# Patient Record
Sex: Male | Born: 1940 | Race: White | Hispanic: No | Marital: Single | Smoking: Former smoker
Health system: Southern US, Community
[De-identification: ages and names within clinical notes are randomized; demographics above are authoritative.]

## PROBLEM LIST (undated history)

## (undated) DIAGNOSIS — Z7901 Long term (current) use of anticoagulants: Secondary | ICD-10-CM

## (undated) DIAGNOSIS — E785 Hyperlipidemia, unspecified: Secondary | ICD-10-CM

## (undated) DIAGNOSIS — K08109 Complete loss of teeth, unspecified cause, unspecified class: Secondary | ICD-10-CM

## (undated) DIAGNOSIS — I252 Old myocardial infarction: Secondary | ICD-10-CM

## (undated) DIAGNOSIS — R609 Edema, unspecified: Secondary | ICD-10-CM

## (undated) DIAGNOSIS — I2510 Atherosclerotic heart disease of native coronary artery without angina pectoris: Secondary | ICD-10-CM

## (undated) DIAGNOSIS — Z973 Presence of spectacles and contact lenses: Secondary | ICD-10-CM

## (undated) DIAGNOSIS — I480 Paroxysmal atrial fibrillation: Secondary | ICD-10-CM

## (undated) DIAGNOSIS — M1990 Unspecified osteoarthritis, unspecified site: Secondary | ICD-10-CM

## (undated) DIAGNOSIS — Z972 Presence of dental prosthetic device (complete) (partial): Secondary | ICD-10-CM

## (undated) DIAGNOSIS — I10 Essential (primary) hypertension: Secondary | ICD-10-CM

## (undated) DIAGNOSIS — E119 Type 2 diabetes mellitus without complications: Secondary | ICD-10-CM

## (undated) HISTORY — DX: Hyperlipidemia, unspecified: E78.5

## (undated) HISTORY — PX: COLONOSCOPY: SHX174

## (undated) HISTORY — DX: Long term (current) use of anticoagulants: Z79.01

## (undated) HISTORY — DX: Old myocardial infarction: I25.2

## (undated) HISTORY — PX: TONSILLECTOMY: SUR1361

## (undated) HISTORY — DX: Paroxysmal atrial fibrillation: I48.0

## (undated) HISTORY — DX: Unspecified osteoarthritis, unspecified site: M19.90

## (undated) HISTORY — DX: Atherosclerotic heart disease of native coronary artery without angina pectoris: I25.10

## (undated) HISTORY — DX: Essential (primary) hypertension: I10

## (undated) HISTORY — DX: Edema, unspecified: R60.9

## (undated) HISTORY — PX: SKIN BIOPSY: SHX1

## (undated) HISTORY — PX: CARDIAC SURGERY: SHX584

## (undated) HISTORY — DX: Type 2 diabetes mellitus without complications: E11.9

## (undated) HISTORY — PX: TONSILLECTOMY: SHX5618

---

## 2002-09-05 HISTORY — PX: CARDIAC SURGERY: SHX584

## 2003-09-06 HISTORY — PX: CORONARY ARTERY BYPASS GRAFT: SHX141

## 2003-11-10 ENCOUNTER — Inpatient Hospital Stay (HOSPITAL_COMMUNITY): Admission: AD | Admit: 2003-11-10 | Discharge: 2003-11-17 | Payer: Self-pay | Admitting: Emergency Medicine

## 2004-01-05 ENCOUNTER — Encounter (HOSPITAL_COMMUNITY): Admission: RE | Admit: 2004-01-05 | Discharge: 2004-04-04 | Payer: Self-pay | Admitting: Cardiology

## 2004-04-05 ENCOUNTER — Encounter (HOSPITAL_COMMUNITY): Admission: RE | Admit: 2004-04-05 | Discharge: 2004-07-04 | Payer: Self-pay | Admitting: Cardiology

## 2004-07-15 ENCOUNTER — Observation Stay (HOSPITAL_COMMUNITY): Admission: EM | Admit: 2004-07-15 | Discharge: 2004-07-16 | Payer: Self-pay | Admitting: Emergency Medicine

## 2005-06-28 ENCOUNTER — Ambulatory Visit (HOSPITAL_COMMUNITY): Admission: RE | Admit: 2005-06-28 | Discharge: 2005-06-28 | Payer: Self-pay | Admitting: Gastroenterology

## 2005-06-28 ENCOUNTER — Encounter (INDEPENDENT_AMBULATORY_CARE_PROVIDER_SITE_OTHER): Payer: Self-pay | Admitting: *Deleted

## 2005-11-03 DIAGNOSIS — I252 Old myocardial infarction: Secondary | ICD-10-CM

## 2005-11-03 HISTORY — DX: Old myocardial infarction: I25.2

## 2009-05-16 ENCOUNTER — Ambulatory Visit: Payer: Self-pay | Admitting: Internal Medicine

## 2009-05-16 ENCOUNTER — Inpatient Hospital Stay (HOSPITAL_COMMUNITY): Admission: EM | Admit: 2009-05-16 | Discharge: 2009-05-19 | Payer: Self-pay | Admitting: Emergency Medicine

## 2009-05-18 HISTORY — PX: CARDIAC CATHETERIZATION: SHX172

## 2010-04-13 ENCOUNTER — Ambulatory Visit: Payer: Self-pay | Admitting: Cardiology

## 2010-05-11 ENCOUNTER — Ambulatory Visit: Payer: Self-pay | Admitting: Cardiology

## 2010-06-08 ENCOUNTER — Ambulatory Visit: Payer: Self-pay | Admitting: Cardiology

## 2010-07-06 ENCOUNTER — Ambulatory Visit: Payer: Self-pay | Admitting: Cardiology

## 2010-07-19 ENCOUNTER — Ambulatory Visit: Payer: Self-pay | Admitting: Cardiology

## 2010-08-02 ENCOUNTER — Ambulatory Visit: Payer: Self-pay | Admitting: Cardiology

## 2010-09-10 ENCOUNTER — Ambulatory Visit: Payer: Self-pay | Admitting: Cardiology

## 2010-09-26 ENCOUNTER — Encounter: Payer: Self-pay | Admitting: Thoracic Surgery (Cardiothoracic Vascular Surgery)

## 2010-10-11 ENCOUNTER — Encounter (INDEPENDENT_AMBULATORY_CARE_PROVIDER_SITE_OTHER): Payer: Medicare Other

## 2010-10-11 DIAGNOSIS — I4891 Unspecified atrial fibrillation: Secondary | ICD-10-CM

## 2010-10-11 DIAGNOSIS — Z7901 Long term (current) use of anticoagulants: Secondary | ICD-10-CM

## 2010-10-25 ENCOUNTER — Other Ambulatory Visit (INDEPENDENT_AMBULATORY_CARE_PROVIDER_SITE_OTHER): Payer: Medicare Other

## 2010-10-25 DIAGNOSIS — Z7901 Long term (current) use of anticoagulants: Secondary | ICD-10-CM

## 2010-10-25 DIAGNOSIS — I4891 Unspecified atrial fibrillation: Secondary | ICD-10-CM

## 2010-11-22 ENCOUNTER — Encounter (INDEPENDENT_AMBULATORY_CARE_PROVIDER_SITE_OTHER): Payer: Medicare Other

## 2010-11-22 DIAGNOSIS — I4891 Unspecified atrial fibrillation: Secondary | ICD-10-CM

## 2010-11-22 DIAGNOSIS — Z7901 Long term (current) use of anticoagulants: Secondary | ICD-10-CM

## 2010-12-06 ENCOUNTER — Ambulatory Visit (INDEPENDENT_AMBULATORY_CARE_PROVIDER_SITE_OTHER): Payer: Medicare Other | Admitting: *Deleted

## 2010-12-06 DIAGNOSIS — Z7901 Long term (current) use of anticoagulants: Secondary | ICD-10-CM

## 2010-12-06 DIAGNOSIS — I4891 Unspecified atrial fibrillation: Secondary | ICD-10-CM | POA: Insufficient documentation

## 2010-12-06 LAB — POCT INR: INR: 1.9

## 2010-12-10 LAB — GLUCOSE, CAPILLARY
Glucose-Capillary: 102 mg/dL — ABNORMAL HIGH (ref 70–99)
Glucose-Capillary: 109 mg/dL — ABNORMAL HIGH (ref 70–99)
Glucose-Capillary: 109 mg/dL — ABNORMAL HIGH (ref 70–99)
Glucose-Capillary: 115 mg/dL — ABNORMAL HIGH (ref 70–99)
Glucose-Capillary: 124 mg/dL — ABNORMAL HIGH (ref 70–99)
Glucose-Capillary: 126 mg/dL — ABNORMAL HIGH (ref 70–99)
Glucose-Capillary: 129 mg/dL — ABNORMAL HIGH (ref 70–99)
Glucose-Capillary: 131 mg/dL — ABNORMAL HIGH (ref 70–99)
Glucose-Capillary: 61 mg/dL — ABNORMAL LOW (ref 70–99)

## 2010-12-10 LAB — BASIC METABOLIC PANEL
BUN: 14 mg/dL (ref 6–23)
Calcium: 9.2 mg/dL (ref 8.4–10.5)
GFR calc non Af Amer: >60 mL/min (ref >60)
Glucose, Bld: 108 mg/dL — ABNORMAL HIGH (ref 70–99)
Potassium: 4 mEq/L (ref 3.5–5.1)

## 2010-12-10 LAB — PROTIME-INR
INR: 1.6 — ABNORMAL HIGH (ref 0.00–1.49)
INR: 1.9 — ABNORMAL HIGH (ref 0.00–1.49)
INR: 2.4 — ABNORMAL HIGH (ref 0.00–1.49)
Prothrombin Time: 19.1 seconds — ABNORMAL HIGH (ref 11.6–15.2)
Prothrombin Time: 21.6 seconds — ABNORMAL HIGH (ref 11.6–15.2)
Prothrombin Time: 25.8 seconds — ABNORMAL HIGH (ref 11.6–15.2)

## 2010-12-10 LAB — POCT I-STAT, CHEM 8
BUN: 19 mg/dL (ref 6–23)
Calcium, Ion: 1.16 mmol/L (ref 1.12–1.32)
Chloride: 104 mEq/L (ref 96–112)
Creatinine, Ser: 1.1 mg/dL (ref 0.4–1.5)
Glucose, Bld: 122 mg/dL — ABNORMAL HIGH (ref 70–99)
HCT: 46 % (ref 39.0–52.0)
Hemoglobin: 15.6 g/dL (ref 13.0–17.0)
Potassium: 3.8 mEq/L (ref 3.5–5.1)
Sodium: 139 mEq/L (ref 135–145)
TCO2: 25 mmol/L (ref 0–100)

## 2010-12-10 LAB — POCT CARDIAC MARKERS
CKMB, poc: 2.2 ng/mL (ref 1.0–8.0)
CKMB, poc: 2.3 ng/mL (ref 1.0–8.0)
Myoglobin, poc: 140 ng/mL (ref 12–200)
Myoglobin, poc: 142 ng/mL (ref 12–200)
Troponin i, poc: <0.05 ng/mL (ref 0.00–0.09)
Troponin i, poc: <0.05 ng/mL (ref 0.00–0.09)

## 2010-12-10 LAB — LIPID PANEL
Cholesterol: 127 mg/dL (ref 0–200)
HDL: 40 mg/dL (ref >39)
LDL Cholesterol: 69 mg/dL (ref 0–99)
Total CHOL/HDL Ratio: 3.2 RATIO
Triglycerides: 90 mg/dL (ref <150)
VLDL: 18 mg/dL (ref 0–40)

## 2010-12-10 LAB — APTT
aPTT: 37 seconds (ref 24–37)
aPTT: 43 seconds — ABNORMAL HIGH (ref 24–37)

## 2010-12-10 LAB — CBC
HCT: 41.7 % (ref 39.0–52.0)
Hemoglobin: 14.1 g/dL (ref 13.0–17.0)
MCHC: 33.8 g/dL (ref 30.0–36.0)
MCV: 94.2 fL (ref 78.0–100.0)
Platelets: 170 10*3/uL (ref 150–400)
Platelets: 190 10*3/uL (ref 150–400)
RBC: 4.42 MIL/uL (ref 4.22–5.81)
RDW: 13.6 % (ref 11.5–15.5)
RDW: 13.7 % (ref 11.5–15.5)
WBC: 10.8 10*3/uL — ABNORMAL HIGH (ref 4.0–10.5)
WBC: 8.7 10*3/uL (ref 4.0–10.5)

## 2010-12-10 LAB — HEMOGLOBIN A1C
Hgb A1c MFr Bld: 6.3 % — ABNORMAL HIGH (ref 4.6–6.1)
Mean Plasma Glucose: 134 mg/dL

## 2010-12-10 LAB — CARDIAC PANEL(CRET KIN+CKTOT+MB+TROPI)
CK, MB: 2.2 ng/mL (ref 0.3–4.0)
CK, MB: 2.3 ng/mL (ref 0.3–4.0)
Relative Index: 1.2 (ref 0.0–2.5)
Relative Index: 1.4 (ref 0.0–2.5)
Total CK: 168 U/L (ref 7–232)
Total CK: 179 U/L (ref 7–232)
Troponin I: 0.01 ng/mL (ref 0.00–0.06)
Troponin I: 0.02 ng/mL (ref 0.00–0.06)

## 2010-12-10 LAB — CK TOTAL AND CKMB (NOT AT ARMC)
CK, MB: 2.9 ng/mL (ref 0.3–4.0)
Relative Index: 1.6 (ref 0.0–2.5)
Total CK: 181 U/L (ref 7–232)

## 2010-12-10 LAB — TROPONIN I: Troponin I: 0.02 ng/mL (ref 0.00–0.06)

## 2010-12-20 ENCOUNTER — Encounter: Payer: Self-pay | Admitting: *Deleted

## 2010-12-20 DIAGNOSIS — I10 Essential (primary) hypertension: Secondary | ICD-10-CM

## 2010-12-20 DIAGNOSIS — E785 Hyperlipidemia, unspecified: Secondary | ICD-10-CM | POA: Insufficient documentation

## 2010-12-20 DIAGNOSIS — Z7901 Long term (current) use of anticoagulants: Secondary | ICD-10-CM | POA: Insufficient documentation

## 2010-12-20 DIAGNOSIS — E119 Type 2 diabetes mellitus without complications: Secondary | ICD-10-CM | POA: Insufficient documentation

## 2010-12-20 DIAGNOSIS — E1159 Type 2 diabetes mellitus with other circulatory complications: Secondary | ICD-10-CM | POA: Insufficient documentation

## 2010-12-21 ENCOUNTER — Ambulatory Visit (INDEPENDENT_AMBULATORY_CARE_PROVIDER_SITE_OTHER): Payer: Medicare Other | Admitting: *Deleted

## 2010-12-21 ENCOUNTER — Other Ambulatory Visit: Payer: Self-pay | Admitting: *Deleted

## 2010-12-21 DIAGNOSIS — Z7901 Long term (current) use of anticoagulants: Secondary | ICD-10-CM

## 2010-12-21 DIAGNOSIS — I4891 Unspecified atrial fibrillation: Secondary | ICD-10-CM

## 2010-12-23 ENCOUNTER — Other Ambulatory Visit: Payer: Self-pay | Admitting: *Deleted

## 2010-12-23 DIAGNOSIS — I2510 Atherosclerotic heart disease of native coronary artery without angina pectoris: Secondary | ICD-10-CM

## 2010-12-23 DIAGNOSIS — I10 Essential (primary) hypertension: Secondary | ICD-10-CM

## 2010-12-23 MED ORDER — LISINOPRIL 40 MG PO TABS: 40.0000 mg | ORAL_TABLET | Freq: Every day | ORAL | Status: DC

## 2010-12-23 MED ORDER — ALLOPURINOL 300 MG PO TABS: 300.0000 mg | ORAL_TABLET | Freq: Every day | ORAL | Status: DC

## 2010-12-23 MED ORDER — METFORMIN HCL ER 500 MG PO TB24: 500.0000 mg | ORAL_TABLET | Freq: Two times a day (BID) | ORAL | Status: DC

## 2010-12-23 NOTE — Telephone Encounter (Signed)
Refill meds per pt request

## 2011-01-03 ENCOUNTER — Ambulatory Visit (INDEPENDENT_AMBULATORY_CARE_PROVIDER_SITE_OTHER): Payer: Medicare Other | Admitting: *Deleted

## 2011-01-03 DIAGNOSIS — Z7901 Long term (current) use of anticoagulants: Secondary | ICD-10-CM

## 2011-01-03 DIAGNOSIS — I4891 Unspecified atrial fibrillation: Secondary | ICD-10-CM

## 2011-01-03 LAB — POCT INR: INR: 2

## 2011-01-21 NOTE — H&P (Signed)
[name], Spencer                        ACCOUNT NO.:  [id number]   MEDICAL RECORD NO.:  [id number]                   PATIENT TYPE:  EMS   LOCATION:  MAJO                                 FACILITY:  [facility]   PHYSICIAN:  Peter M. Swaziland, M.D.               DATE OF BIRTH:  [birth year]   DATE OF ADMISSION:  11/10/2003  DATE OF DISCHARGE:                                HISTORY & PHYSICAL   HISTORY OF PRESENT ILLNESS:  [name] is a 70 year old white man who  is admitted to [facility] for an acute anteroseptal myocardial  infarction.   The patient, who has no past history of cardiac disease, presented to the  emergency department approximately 45 minutes after experiencing onset of  chest pain.  This occurred while he was sleeping.  The chest pain was  described as a substernal pressure.  It radiated to his left neck.  It was  associated with mild dyspnea but no diaphoresis or nausea.  There were no  exacerbating or ameliorating factors.  It appeared not to be related to  position, activity, meals, or respirations.  With the measures employed by  EMS and those employed upon his arrival in the emergency department, his  chest discomfort has begun to wane.   As noted, the patient has no past history of cardiac disease, including no  history of chest pain, myocardial infarction, coronary artery disease,  congestive heart failure, or arrhythmia.  He has a number of risk factors  for coronary artery disease.  He has a history of hyperlipidemia,  dyslipidemia, and non-insulin-dependent diabetes mellitus.  In addition,  there is a strong family history of early coronary artery disease; both his  mother and father suffered from coronary artery disease in their 7s.  The  patient does not smoke.   MEDICATIONS:  The patient is on a number of medications; these include:  1. Allopurinol 300 mg p.o. daily.  2. Metformin 500 mg p.o. b.i.d.  3. Celebrex 200 mg p.o. b.i.d.  4.  Crestor 10 mg p.o. daily.  5. Lisinopril 40 mg p.o. daily.  6. Another unknown antihypertensive.   ALLERGIES:  He is reportedly allergic to [facility].   SOCIAL HISTORY:  The patient is a retired Naval architect.  He lives with his  wife.   FAMILY HISTORY:  Family history is as described above.   REVIEW OF SYSTEMS:  Review of systems demonstrated no new problems related  to his head, eyes, ears, nose, mouth, throat, lungs, gastrointestinal  system, genitourinary system, or extremities.  There is no history of  neurologic or psychiatric disorder.  There is no history of fever, chills,  or weight loss.   PHYSICAL EXAMINATION:  VITAL SIGNS:  Blood pressure 155/78.  Pulse 81 and  regular.  Respirations 18.  GENERAL:  The patient was an obese, middle-aged white male in no discomfort.  He was alert, oriented, appropriate, and responsive.  HEENT:  Head, eyes, nose, and mouth were normal.  NECK:  The neck was without thyromegaly or adenopathy.  Carotid pulses were  palpable bilaterally and without bruits.  CARDIAC:  Examination revealed a normal S1 and S2.  There was no S3, S4,  murmur, rub, or click.  Cardiac rhythm was regular.  No chest wall  tenderness was noted.  LUNGS:  The lungs were clear.  ABDOMEN:  The abdomen was soft and nontender.  There was no mass,  hepatosplenomegaly, bruit, distention, rebound, guarding, or rigidity.  Bowel sounds were normal.  RECTAL AND GENITAL:  Examinations were not performed as they were not  pertinent to the reason for acute care hospitalization.  EXTREMITIES:  The extremities were without edema, deviation, or deformity.  Radial and dorsalis pedal pulses were palpable bilaterally.  NEUROLOGIC:  Brief screening neurologic survey was unremarkable.   LABORATORY AND ACCESSORY CLINICAL DATA:  The electrocardiogram demonstrated  acute anteroseptal ST segment elevation.   The chest radiograph demonstrated interstitial edema.   All laboratory studies were  pending at the time of this dictation.   IMPRESSION:  1. Acute anteroseptal myocardial infarction.  2. Hypertension.  3. Dyslipidemia.  4. Diabetes mellitus.  5. Gout.   PLAN:  1. Emergent cardiac catheterization.  2. Intravenous heparin, intravenous nitroglycerin, intravenous metoprolol,     and oral aspirin until then.  3. Cardiac catheterization, PTCA, and stent deployment have been discussed     with the patient and his wife.  They understand the risks of the     procedures, the benefits of the procedures, and the alternatives to the     procedures, and they wish to proceed.      Quita Skye. Waldon Reining, MD                   Peter M. Swaziland, M.D.    MSC/MEDQ  D:  11/10/2003  T:  11/10/2003  Job:  [id number]   cc:   Peter M. Swaziland, M.D.  [address], [address]  [place], [place] [address]  Fax: [phone]

## 2011-01-21 NOTE — Consult Note (Signed)
NAMETAMARCUS, CONDIE                        ACCOUNT NO.:  [id number]   MEDICAL RECORD NO.:  [id number]                   PATIENT TYPE:  INP   LOCATION:  2930                                 FACILITY:  [facility]   PHYSICIAN:  Salvatore Decent. Dorris Fetch, M.D.         DATE OF BIRTH:  [birth year]   DATE OF CONSULTATION:  11/10/2003  DATE OF DISCHARGE:                                   CONSULTATION   REASON FOR CONSULTATION:  Severe two-vessel disease, status post MI.   CHIEF COMPLAINT:  Chest pain.   HISTORY OF PRESENT ILLNESS:  Mr. Blaustein is a 69 year old gentleman.  He  has no prior history of cardiac disease.  He noted the onset of severe  substernal chest pressure last night.  This awoke him from his sleep around  10:30 or 11 o'clock.  It radiated to his left neck.  He had some mild  dyspnea, but no diaphoresis or nausea.  He got up to try to go to the  bathroom, but every time he took a step the pain became worse.  He had no  relief after going to the bathroom and then called 911.  He was seen by EMS.  He was treated with nitroglycerin and aspirin en route to the hospital and  was having resolution of his chest pain by the time he arrived.  In  retrospect, the patient says that he has had heartburn, not consistently  exertional in nature, but usually is relieved by Tums.  He has no prior  history of known angina.   PAST MEDICAL HISTORY:  1. Hyperlipidemia.  2. Hypertension.  3. Type 2 noninsulin-dependent diabetes.  4. History of heavy tobacco abuse.  5. He has no history of stroke, TIA or cardiac disease.   MEDICATIONS PRIOR TO ADMISSION:  1. Allopurinol 300 mg p.o. daily.  2. Metformin 500 mg p.o. b.i.d.  3. Celebrex 200 mg p.o. b.i.d.  4. Crestor 10 mg p.o. daily.  5. Lisinopril 40 mg p.o. b.i.d.  6. Ziac.   ALLERGIES:  KEFLEX, with rash.   FAMILY HISTORY:  Both mother and father had heart disease in their 35s with  MIs.   SOCIAL HISTORY:  He is a retired Naval architect.   He had a past history of  tobacco abuse.  He quit in 1989.  He lives with his wife.   REVIEW OF SYSTEMS:  He states that he has been tired and feeling more  fatigue for the past six months to one year.  He attributed this to  inactivity.  He has been relatively inactive.  He did not have any history  of stroke or mini stroke symptoms.  No history of blood clot or  varicosities.  No change in bowel or bladder habits.  No history of  excessive bleeding or bruising.  All other systems are negative.   PHYSICAL EXAMINATION:  GENERAL:  Mr. Every is a well-appearing, 63-year-  old, white male in  no acute distress.  He is overweight, but well-developed  and well-nourished.  VITAL SIGNS:  Blood pressure 115/55; pulse 65 and regular; respirations 14;  he is on two liters of nasal cannula with oxygen saturation of 94%.  NEUROLOGIC:  He is alert and oriented x3 and grossly intact.  HEENT:  Unremarkable.  NECK:  Supple.  There is no thyromegaly, adenopathy, bruits or JVD.  CARDIAC:  Regular rate and rhythm.  Normal S1, S2.  There is no rub or  murmur.  LUNGS:  Clear with equal breath sounds bilaterally.  There is no wheezing or  rales.  ABDOMEN:  Soft and nontender.  EXTREMITIES:  Without clubbing, cyanosis or edema.  He has 2+ dorsalis pedis  and radial pulses bilaterally.   LABORATORY DATA:  Chest radiograph shows questionable 1 cm right lung  nodule, which may be a nipple shadow.  This will be further evaluated  postoperatively.  He also had some interstitial edema and his heart size was  normal.  His EKG showed anteroseptal ST elevations on admission.  His  hematocrit was 46, white count 11.1, platelets 219.  His creatinine was 1.2,  PT 32, PTT is 12.3.  Peak CK was 377 with an MB of 22.7.  Troponin was 1.48.   IMPRESSION:  Mr. Santilli is a 70 year old white male with multiple cardiac  risk factors.  He now presents with an acute myocardial infarction which  aborted en route to the  hospital.  He was evaluated with emergent  catheterization, which revealed critical two-vessel disease in the left  dominant circulation.  He had critical disease in his LAD, which was not  favorable for percutaneous intervention, also significant disease in the  diagonal as well as two obtuse marginal branches.  As the patient's symptoms  resolved and his EKG changes resolved after arrival, he has been treated  medically with intravenous nitroglycerin and Integrilin.  Coronary artery  bypass grafting is indicated for survival benefit and relief of symptoms.  As he is presently stable, we will tentatively plan for OR tomorrow, second  case.  Should he have recurrent or unstable symptoms arise, he could be done  emergently.   I discussed with the patient and the family the indications, the risks,  benefits and alternative treatments.  They understand that the risks  include, but are not limited to, death, stroke, MI, DVT, PE, bleeding,  possible need for transfusions, infections, as well as other organ and  system dysfunction including respiratory, renal or GI dysfunction.  He  understands and accepts the risks of surgery and agrees to proceed, but he  and his family  understand the expected postoperative recovery.  They also understand the  procedure is palliative and further lifestyle modification is important for  his long term outcome.  All of the patient's and family member's questions  were answered.                                               Salvatore Decent Dorris Fetch, M.D.    SCH/MEDQ  D:  11/10/2003  T:  11/11/2003  Job:  [id number]   cc:   Peter M. Swaziland, M.D.  [address]  [place], [place] [address]  Fax: [phone]   Quita Skye. Waldon Reining, MD  [address]  [place], [place] [address]  Fax: [phone]

## 2011-01-21 NOTE — Discharge Summary (Signed)
[name], [name]                        ACCOUNT NO.:  [id number]   MEDICAL RECORD NO.:  [id number]                   PATIENT TYPE:  INP   LOCATION:  2027                                 FACILITY:  [facility]   PHYSICIAN:  Salvatore Decent. Dorris Fetch, M.D.         DATE OF BIRTH:  [birth year]   DATE OF ADMISSION:  11/08/2003  DATE OF DISCHARGE:  11/17/2003                                 DISCHARGE SUMMARY   ADMISSION DIAGNOSIS:  Acute anteroseptal myocardial infarction.   ADDITIONAL DIAGNOSES/DISCHARGE DIAGNOSES:  1. Postoperative atrial fibrillation/atrial flutter, resolved at the time of     discharge to normal sinus rhythm.  2. History of hyperlipidemia.  3. History of type 2 diabetes mellitus.  4. History of heavy tobacco abuse.   HOSPITAL MANAGEMENT/PROCEDURES:  1. Cardiac catheterization completed on November 10, 2003, by Dr. Peter Swaziland.  2. Bilateral carotid duplex study completed on November 10, 2003, which revealed     no internal carotid artery stenosis.  3. Bilateral ABIs completed on November 10, 2003, which were within normal     limits.  4. Coronary artery bypass grafting x4 completed on November 11, 2003, by Dr.     Dorris Fetch.   CONSULTATIONS:  1. Cardiac rehab.  2. Case management.  3. Nutrition.  4. Diabetes coordinator.   HPI:  [name] is a 70 year old white man who has no past history of  coronary artery disease and presented to the emergency department after  experiencing onset of chest pain.  His chest pain occurred while he was  sleeping and he described it as a substernal pressure and radiated to his  left neck.  It was associated with mild dyspnea but no diaphoresis or  nausea.  There were no exacerbating or ameliorating factors.  Patient was  seen in the emergency department and admitted for further evaluation and  management of an acute anteroseptal myocardial infarction.   HOSPITAL COURSE:  [name] was admitted then to [facility]  November 10, 2003,  for evaluation and management of an acute myocardial  infarction.  Patient was treated appropriately with medical management.  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  He had  well preserved left ventricular function with focal apical wall motion  abnormality.  The patient remained hemodynamically stable.  Given the  finding of multi-vessel disease, it was recommended that he be stabilized  medically and be considered for coronary artery bypass grafting surgery.  A  CVTS consult was initiated and Dr. Dorris Fetch responded appropriately.   Dr. Dorris Fetch saw the patient in consultation on November 10, 2003, and his  impression was that he indeed had severe coronary artery disease and would  benefit from coronary revascularization through coronary artery bypass  grafting.  The risks, benefits and alternatives were discussed with the  patient and his family at that time.  They understood these risks and agreed  to proceed with surgery.  The patient was worked up appropriately with  preoperative vascular studies including bilateral carotid duplexes, radial  artery assessment as well as ABIs.  Patient was found to have no significant  internal carotid artery stenosis.  A right Doppler radial was normal with a  decrease to 50% with over-compression.  Left Doppler was decreased by  greater than 50% with radial compressions and was normal with ulnar  compression.  He had bilateral ABIs that were within normal limits, that  being greater than 1.0.  The patient remained hemodynamically stable and  free of chest pain until the time of anticipated surgery which was planned  for November 11, 2003.   The patient was taken to the operating room on November 11, 2003, and underwent  coronary artery bypass grafting x4 utilizing the left internal mammary  artery to the left anterior descending, saphenous vein graft  to the first  diagonal, sequential saphenous vein graft to the obtuse marginals 1 and 2.  The greater saphenous vein was harvested endoscopically from the right  thigh.  Patient tolerated the procedure well and was transferred to the  surgical intensive care unit in critical but stable condition.  The patient  remained hemodynamically stable throughout the evening.  He had good urine  output and no evidence of bleeding from his chest tube.  Patient was weaned  to extubate from the ventilator without difficulty later that evening.   On postop day #1, the patient continued to do fairly well.  He was afebrile  and hemodynamically stable.  He was neurologically intact post extubation.  His heart was in a regular rate and rhythm and his chest was clear.  Patient's chest tube output was minimal and they were discontinued in  stepwise fashion.  His ___________ lines were also discontinued in routine  fashion.  Patient was initiated on beta-blocker therapy as well as enteric  coated aspirin and statin therapy.  Later that evening, the patient went  into a rapid atrial fibrillation/atrial flutter at the rate of 100-150s.  He  was asymptomatic.  A Cardizem bolus was given and he was started on a  Cardizem drip thereafter.  The patient's rate decreased with IV Cardizem  therapy to a rate controlled atrial fib/atrial flutter.  He remained  asymptomatic and  hemodynamically stable.  Patient was loaded with IV  amiodarone in addition to IV Cardizem.  The cardiology team attempted rapid  atrial pacing to convert the patient out of this arrhythmia and was  unsuccessful.  Otherwise, the patient was initiated on cardiac rehab phase 1  and was advanced as tolerated.  Over the next hospital day, his IV  amiodarone therapy was converted to p.o. medication.  The patient converted  back into a normal sinus rhythm at a rate of 70s-80s.  He was continued on p.o. amiodarone and beta-blocker.  Patient continued to  be diuresed  appropriately and his diabetes was managed with insulin therapy in addition  to oral Amaryl.  His ACE inhibitor and Metformin were held secondary to mild  renal insufficiency postoperatively.  The patient was deemed appropriate for  transfer to the Step-Down Unit on November 14, 2003, or postop day #3.  He  remained in a normal sinus rhythm at a rate of 70-80.  He was ambulating in  the hallways without difficulty.  He was tolerating a regular diet.  He had  normal bowel and bladder function.  His pain was well controlled with oral  medications.  He was sating at the low to mid 90s on room air.  His weight  was trending towards preoperative weight.  His BUN and creatinine were  monitored for slight renal insufficiency and trended towards normal prior to  discharge planning.  The patient was deemed appropriate for discharge  planning on postop day #5, on November 16, 2003.   At the time of discharge planning, the patient's physical exam was as  follows.  Remained afebrile for greater than 48 hours.  His blood pressure  was 140/66, pulse was 80 and regular, respirations were 20 and unlabored,  SPO2 was 95% on room air.  His heart was in a regular rate and rhythm,  reading normal sinus rhythm on telemetry.  His lungs revealed faint  bibasilar crackles, otherwise were clear.  His abdomen was benign.  Extremities revealed trace pedal edema.  His incisions were healing well  without evidence of infection.  His sternum was stable.   LABORATORY VALUES AT THE TIME OF DISCHARGE PLANNING WERE AS FOLLOWS:  On  November 15, 2003, a CBC reads WBC of 13.0, hemoglobin of 10.0, hematocrit of  29.3, platelet count of 202.  BMP from November 15, 2003, reads sodium of 135,  potassium of 3.8, chloride of 100, CO2 of 29, glucose of 112, BUN of 36,  creatinine of 1.6 and calcium of 8.3.  Bedside glucose levels for the last  24 hours were as follows:  97, 112, 116 and 145.   Two-view chest x-ray completed on  November 13, 2003, reveals no evidence of  pneumothorax with bibasilar atelectasis postoperatively.   We will continue as planned with discharge to home Monday, November 17, 2003,  pending a.m. rounds and no change in the patient's clinical status.   DISCHARGE MEDICATIONS:  1. Aspirin 325 mg daily.  2. Toprol XL 50 mg daily.  3. Lisinopril 40 mg daily.  4. Crestor 10 mg daily.  5. Allopurinol 300 mg daily.  6. Amiodarone 200 mg three times daily.  7. Lantus insulin 30 units subcu q.h.s.  8. Lasix 40 mg daily for seven days.  9. Metformin 500 mg twice daily.  10.      Ultram 50 mg one or two tabs every 4-6 hours p.r.n. for pain.   ALLERGIES:  KEFLEX.   DISCHARGE INSTRUCTIONS:  1. Activity:  No driving, no lifting of more than 10 pounds.  The patient     should continue to walk daily.  2. Diet:  The patient should follow a low-fat, low-salt, carbohydrate-     modified medium calorie diet. 3. Wound care:  The patient may shower.  He should clean his incisions daily     with soap and water.  He should notify the CVTS office if he has any     increased redness, draining or swelling from his incisions.   FOLLOWUP APPOINTMENTS:  1. Patient is to followup with Dr. Swaziland within two weeks of discharge.  He     should call Dr. Elvis Coil office at [phone] to make this appointment     date and time.  2. Patient is to followup with Dr. Dorris Fetch within three weeks of     discharge.  The CVTS office will call him with appointment date and time.     The patient should obtain a chest x-ray at [facility]     one hour prior to this appointment and hand carry these films to Dr.     Sunday Corn office.  Carolyn A. Eustaquio Boyden.                  Salvatore Decent Dorris Fetch, M.D.    CAF/MEDQ  D:  11/16/2003  T:  11/18/2003  Job:  [id number]   cc:   Salvatore Decent. Dorris Fetch, M.D.  [address]  [place]  [place] [address]   Peter M. Swaziland, M.D.  [address]   [place], [place] [address]  Fax: [phone]   Kathrynn Running, M.D.

## 2011-01-21 NOTE — Op Note (Signed)
[name], [name]                        ACCOUNT NO.:  [id number]   MEDICAL RECORD NO.:  [id number]                   PATIENT TYPE:  INP   LOCATION:  2307                                 FACILITY:  [facility]   PHYSICIAN:  Salvatore Decent. Dorris Fetch, M.D.         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   PROCEDURE:  Median sternotomy, extracorporeal circulation, coronary artery  bypass grafting x4 (left internal mammary artery to the LAD, saphenous vein  graft to the first diagonal, sequential saphenous vein graft to obtuse  marginal 1 and 2), endoscopic vein harvest right thigh.   SURGEON:  Salvatore Decent. Dorris Fetch, M.D.   ASSISTANT:  [name], N.P.   ANESTHESIA:  General.   FINDINGS:  LAD diffusely diseased, but good quality at the site of the  anastomosis.  Remaining targets good quality at the site of the anastomoses.  Good quality conduits.  Edema in anterior wall.   INDICATIONS FOR PROCEDURE:  The patient is a 70 year old gentleman with no  prior cardiac history.  He presented on November 10, 2003, with an acute  anterior MI.  The patient was awakened from sleep with severe substernal  chest pain.  This persisted.  He was transported to the emergency room by  EMS and given aspirin and nitroglycerin en route.  He had resolution of his  pain at the time of arrival.  At catheterization, he had severe two vessel  disease in the left dominant circulation with a subtotal occlusion of the  LAD. The patient was stabilized medically and referred for coronary artery  bypass grafting.  The indications, risks, benefits, and alternatives were  discussed in detail with the patient.  He understood  and accepted the risks  of surgery and agreed to proceed.  On the patient's preoperative chest x-ray  there was a questionable lung nodule felt likely to be a nipple shadow.  Given the patient's acute MI and heparin and Integrilin preoperatively, it  was felt not wise to deal with the lung nodule at this time.  Further  radiologic study will be done in the postoperative period.   DESCRIPTION OF PROCEDURE:  The patient was brought to the preoperative  holding area on November 11, 2003.  There, lines were placed by anesthesia for  monitoring arterial, central venous, and pulmonary arterial pressure.  EKG  leads were placed for continuous telemetry.  Intravenous antibiotics were  administered.  The patient was taken to the operating room, anesthetized,  and intubated.  A Foley catheter was placed.  The chest, abdomen, and legs  were prepped and draped in the usual fashion.   A median sternotomy was performed and  the left internal mammary artery was  harvested using standard technique.  The mammary artery was a good quality  vessel.  Simultaneously an incision was made in the medial aspect of the  right leg at the level of the knee.  Greater saphenous vein was identified  at the knee and then harvested from the thigh endoscopically.  The patient  was fully heparinized prior to dividing the distal end of the left mammary  artery. There was excellent flow through the cut end of the vessel.   The pericardium was opened.  The ascending aorta was inspected and palpated.  There was no palpable atherosclerotic disease.  The aorta was cannulated via  concentric 2-0 Ethibond pledgeted pursestring sutures.  A dual stage venous  cannula was placed via pursestring suture in the right atrial appendage.  Cardiopulmonary bypass was instituted and the patient was cooled to 32  degrees Celsius.  The coronary arteries were inspected and anastomotic sites  were chosen. The conduits were inspected and cut to  length.  A foam pad was  placed in the pericardium to protect the left phrenic nerve.  A temperature  probe was placed in the myocardial septum and a cardioplegia cannula was  placed in the ascending aorta.   The aorta was crossclamped.  The left ventricle was emptied via the aortic  root vent.  Cardiac arrest then was achieved with a combination of cold  antegrade blood cardioplegia and topical iced saline.  1200 mL of  cardioplegia was administered.  The myocardial septal temperature was 11  degrees Celsius.  The following distal anastomoses then were performed.   First a reversed saphenous vein graft was placed sequentially to obtuse  marginal branches 1 and 2.  The patient had a left dominant circulation.  There was a tiny ramus branch which did not have disease.  There were 90%  stenoses at the takeoff of OM1 and 2.  OM1 was a 1.5 mm good quality vessel.  OM2 was a 1.5 mm fair quality vessel.  The anastomoses were performed with  running 7-0 Prolene sutures.  A side-to-side anastomosis was performed at  OM1 and end-to-side to OM2.  Both were probed proximally and distally to  ensure patency prior to tying the sutures.  There was excellent flow through  the graft.  Cardioplegia was administered.  There was good hemostasis at the  anastomoses.   Next a reversed saphenous vein graft was placed end-to-side to the first  diagonal branch of the LAD.  This was a high anterolateral branch.  It was a  1.5 mm good quality target.  The anastomosis again was performed end-to-side  with a running 7-0 Prolene suture and again was probed proximally and  distally.  There was excellent flow through the graft.  Cardioplegia was  administered.  There was good hemostasis.   Next, the left internal mammary artery was brought through a window in the  pericardium.  The distal end was spatulated and then was anastomosed end-to- side to the distal LAD.  The LAD was diffusely diseased vessel, 1.5 mm  probe  did pass distally to the apex.  A 1 mm probe passed for a short distance  proximally before meeting tight obstruction.  The mammary was anastomosed  end-to-side to the LAD with a running 8-0 Prolene suture.  At the completion  of the mammary to LAD anastomosis, the Bulldog clamp was briefly removed to  inspect for hemostasis and then was replaced.  Immediate and rapid septal  rewarming was noted.  The mammary pedicle was tacked to the epicardial  surface of the heart with 6-0 Prolene sutures.   Additional cardioplegia was administered down the vein grafts and the aortic  root. The vein grafts were cut to length.  The cardioplegia cannula was  removed from the ascending aorta and the proximal vein graft anastomoses  were performed to 4.4 mm punch aortotomies with a running 6-0 Prolene  sutures.  AT the completion of the final proximal anastomoses, the patient  was placed in steep Trendelenburg position.  The Bulldog clamp was once  again removed from the left internal mammary artery.  Lidocaine was  administered.  Immediate and rapid septal rewarming was again noted.  The  heart was filled with blood, the lungs were inflated, the aortic root was  deaired and the aortic crossclamp was removed.  The total crossclamp time  was 71 minutes.  The lungs were deflated and the heart was emptied.   The patient required a single defibrillation with 20 joules.  He then  resumed sinus rhythm.  While the patient was being rewarmed, all proximal  and distal anastomoses were inspected for hemostasis and epicardial pacing  wires were placed on the right ventricle and right atrium.  A dopamine drip  was initiated at 5 mcg/kg/minute.  When the patient had been rewarmed to a  core temperature of 37 degrees Celsius, he was weaned from cardiopulmonary  bypass.  He was atrially paced for rate and on the Dopamine drip at the time  of separation from bypass.  Total bypass time was 109 minutes.  The  initial  cardiac index was greater than 2 L/minute/meter squared and the patient  remained hemodynamically stable throughout the post bypass period.   A test dose of Protamine was administered and was well tolerated.  The  atrial and aortic cannuli were removed.  The remainder of the Protamine was  administered without incident. The chest was irrigated with 1 liter of warm  normal saline containing 1 gram of Vancomycin.  Hemostasis was achieved.  A  left pleural and two mediastinal chest tubes were placed through separate  subcostal incisions.  The mediastinal tissue was reapproximated over the  heart and ascending aorta to prevent adhesions to the sternum.  The sternum  was closed with heavy gauge interrupted stainless steel wires.  The  remainder of the incisions were closed in standard fashion.  Subcuticular  skin closures were used.  Needle, sponge, and instrument counts correct at the end of the procedure.  There was a missing needle from an 8-0 Prolene  suture which was not localized.  An x-ray will be done in the intensive care  unit.                                               Salvatore Decent Dorris Fetch, M.D.    SCH/MEDQ  D:  11/11/2003  T:  11/12/2003  Job:  [id number]   cc:   Peter M. Swaziland, M.D.  [address], [address]  [place], [place] [address]  Fax: [phone]   Darol Destine, M.D.

## 2011-01-21 NOTE — H&P (Signed)
NAMEDARNEL, MCHAN            ACCOUNT NO.:  [id number]   MEDICAL RECORD NO.:  [id number]          PATIENT TYPE:  EMS   LOCATION:  MAJO                         FACILITY:  [facility]   PHYSICIAN:  Ulyses Amor, MD DATE OF BIRTH:  1940/12/15   DATE OF ADMISSION:  07/15/2004  DATE OF DISCHARGE:                                HISTORY & PHYSICAL   [name] is a 70 year old white man who was admitted to [facility] for further evaluation of chest pain.   The patient has a history of cardiac disease which dates back to March of  this year.  At that time he presented with an acute anteroseptal myocardial  infarction. Cardiac catheterization demonstrated multi-vessel coronary  artery disease, and he subsequently underwent coronary artery bypass  surgery. He received a left internal mammary artery graft to the LAD,  saphenous vein graft to the first diagonal, and a sequential saphenous vein  graft to the first and then second obtuse marginal.  The patient's course  was uncomplicated with the exception of postoperative atrial fibrillation  and atrial flutter which had resolved at the time of discharge.   The patient had experienced no chest pain since then until this evening.  While watching television this evening he experienced the onset of chest  pain. This was described as a pressure in the lower substernal area.  It  radiated to the left neck. It was not associated with dyspnea, diaphoresis,  or nausea.  There were no exacerbating or ameliorating factors.  It appeared  not to be related to position, activity, meals, or respiration. He took one  nitroglycerin tablet at home which resulted in slight improvement. EMS was  summoned. He was given 2 additional doses of nitroglycerin en route to the  hospital which resulted in complete resolution of chest pain.  The total  duration of chest pain was approximately 2-1/2 hours.  He reports that the  chest pain was similar in  quality, but much milder, than the chest pain  which heralded his acute myocardial infarction. The patient is free of chest  pain and otherwise asymptomatic at this time.   There is no history of congestive heart failure.   The patient has a history of hypertension, dyslipidemia, and diabetes  mellitus, all of which are under treatment. There is a strong family history  of early coronary artery disease; both his father and his brother suffered  from coronary artery disease in their 77's.  The patient also smoked heavily in the past.   MEDICATIONS:  The patient is on a number of medications for the  aforementioned medical problems, these include:  1.  Aspirin 325 mg p.o. daily.  2.  Crestor 10 mg p.o. daily.  3.  Norvasc 2.5 mg p.o. daily.  4.  Lisinopril 40 mg p.o. daily.  5.  Toprol XL 50 mg p.o. daily.  6.  Allopurinol 300 mg p.o. daily.  7.  Metformin 500 mg p.o. b.i.d.   ALLERGIES:  He is reportedly is allergic to [facility].   SOCIAL HISTORY:  The patient is a retired [employer] architect, he lives  with his  wife.   FAMILY HISTORY:  As described above.   PREVIOUS OPERATIONS:  Left elbow surgery and coronary artery bypass surgery.   REVIEW OF SYSTEMS:  Reveals no new problems related to his head, eyes, ears,  nose, mouth, throat, lungs, gastrointestinal system, genitourinary system,  or extremities.  There is no history of neurologic or psychiatric disorder.  There is no history of fever, chills, or weight loss.   PHYSICAL EXAMINATION:  VITAL SIGNS:  Blood pressure 143/81. Pulse 83 and  regular. Respirations 20. Temperature 98.3.  GENERAL:  The patient is an obese, middle-aged white man in no discomfort.  He was alert, oriented, appropriate, and responsive.  HEENT:  Head, eyes, nose, mouth were normal.  NECK:  Without thyromegaly or adenopathy. Carotid pulses were palpable  bilaterally and without bruits.  CARDIAC EXAMINATION:  Revealed a normal S1 and S2. There was no S3, S4,   murmur, rub, or click. Cardiac rhythm was regular. No chest wall tenderness  was noted.  LUNGS:  Clear.  ABDOMEN:  Soft and nontender. There was no mass, hepatosplenomegaly, bruit,  distention, rebound, guarding, or rigidity.  Bowel sounds were normal.  RECTAL/GU:  Examinations were not performed as they were not pertinent to  the reason for acute care hospitalization.  EXTREMITIES:  Without edema, deviation, or deformity. Radial and dorsalis  pedis pulses were palpable bilaterally.  BRIEF SCREENING NEUROLOGIC SURVEY:  Unremarkable.   The electrocardiogram revealed normal sinus rhythm. The possibility of a  prior anteroseptal myocardial infarction could not be excluded.  The chest  radiograph, according to the radiologist, demonstrated only minimal  bibasilar atelectasis. The initial set of cardiac markers revealed a  myoglobin of 106, CK-MB 2.0, and troponin less than 0.05.  Potassium was  3.9, BUN 9, and creatinine 1.2. White count was 10,900 with a hemoglobin of  14.3 and hematocrit of 40.7.  The remaining studies were pending at the time  of this dictation.   IMPRESSION:  1.  Chest pain; rule out unstable angina.  2.  Coronary artery disease. Status post anteroseptal myocardial infarction      in March, 2005 followed by coronary artery bypass surgery with a left      internal mammary artery graft to the LAD, a saphenous vein graft to the      first diagonal, and a sequential saphenous vein graft to the first and      then second obtuse marginal.  3.  Hypertension.  4.  Dyslipidemia.  5.  Non-insulin-dependent diabetes mellitus.  6.  Gout.   PLAN:  1.  Telemetry.  2.  Serial cardiac enzymes.  3.  Aspirin.  4.  Intravenous heparin.  5.  Intravenous nitroglycerin.  6.  Toprol XL.  7.  Further measures per Dr. Swaziland.      Mitc   MSC/MEDQ  D:  07/15/2004  T:  07/15/2004  Job:  [id number]   cc:   Peter M. Swaziland, M.D. [address]  [place], [place] [address]   Fax: [phone]

## 2011-01-21 NOTE — Cardiovascular Report (Signed)
[name], [name]                        ACCOUNT NO.:  [id number]   MEDICAL RECORD NO.:  [id number]                   PATIENT TYPE:  INP   LOCATION:  2307                                 FACILITY:  [facility]   PHYSICIAN:  Peter M. Swaziland, M.D.               DATE OF BIRTH:  Apr 01, 1941   DATE OF PROCEDURE:  11/10/2003  DATE OF DISCHARGE:                              CARDIAC CATHETERIZATION   INDICATION FOR PROCEDURE:  Mr. Mciver is a 70 year old white male with  history of diabetes mellitus type 2, hypertension, hypercholesterolemia and  presents with an acute anterior septal myocardial infarction.  Pain onset  was one hour prior to presenting to the emergency room.   PROCEDURE:  Left heart catheterization, coronary and left ventricular  angiography.   EQUIPMENT:  7 French arterial sheath, 6 French 4 cm right and left Judkins  catheter, 6 French pigtail catheter.   MEDICATIONS:  Local anesthesia with 1% Xylocaine.  Nitroglycerin drip was at  24 mcg per minute.  Lopressor 5 mg IV x2.  Heparin an additional 2000 units  IV with IV drip at 1000 units per hour.  Integrilin double bolus at 0.18  mcg/kg followed by continuous IV infusion at 2 mcg/kg/min.   CONTRAST:  130 mL of Omnipaque.   HEMODYNAMIC DATA:  Aortic pressure 145/81 with mean of 110.  Left  ventricular pressure was 142 with EDP of 22 mmHg.   ANGIOGRAPHIC DATA:  The left coronary artery arises and distributes in a  dominant fashion.  The left main coronary artery is very short without  significant disease.   The left anterior descending artery has a severe 95% stenosis in the mid  vessel following the first diagonal take off.  This appears to be the  culprit lesion.  Following this, there is a longer segmental 80% stenosis  involving the mid LAD.  The first diagonal is a moderate size vessel which  has a 70% stenosis at its ostium followed by an 80-90% stenosis in the  proximal vessel.  There is also 80-90% stenosis  involving the proximal  septal perforator.   The left circumflex coronary artery is a large dominant vessel.  It has mild  disease of the ostium up to 30%.  The circumflex vessel in the AV groove has  mild irregularities less than 20%.  The first marginal branch has a 90%  stenosis proximally.  The second marginal branch has a 90% stenosis  proximally.  The third marginal branch is without significant disease.  The  posterior descending artery off the circumflex is also without significant  disease.   The right coronary is a small nondominant vessel which appears normal.   LEFT VENTRICULAR ANGIOGRAPHY:  Performed in the RAO and LAO cranial views  demonstrates focal apical akinesia with overall well preserved left  ventricular systolic function.  Ejection fraction is estimated at 55%.  There is mitral regurgitation or prolapse.  FINAL INTERPRETATION:  1. Multivessel obstructive atherosclerotic coronary artery disease.  The     culprit lesion in the mid left anterior descending has spontaneously     reperfused.  2. Well preserved left ventricular function with focal apical wall motion     abnormality.   PLAN:  At this point in the procedure, the patient's pain had completely  resolved.  His ST segment elevation had also resolved with only minimal  upsloping ST segment in lead V2.  He was hemodynamically stable.  Given his  multivessel disease, it was recommended that he be stabilized medically and  be considered for coronary artery bypass surgery.  As such, the patient was  transferred to the intensive care unit on IV heparin, IV integrilin and will  continue with IV nitroglycerin and beta blockade.  CVTS consult is called.                                               Peter M. Swaziland, M.D.    PMJ/MEDQ  D:  11/11/2003  T:  11/12/2003  Job:  [id number]   cc:   Dr. Carolyn Stare   Dr. Kathrynn Running

## 2011-02-01 ENCOUNTER — Ambulatory Visit (INDEPENDENT_AMBULATORY_CARE_PROVIDER_SITE_OTHER): Payer: Medicare Other | Admitting: *Deleted

## 2011-02-01 DIAGNOSIS — Z7901 Long term (current) use of anticoagulants: Secondary | ICD-10-CM

## 2011-02-01 DIAGNOSIS — I4891 Unspecified atrial fibrillation: Secondary | ICD-10-CM

## 2011-02-07 IMAGING — CR DG CHEST 2V
2 series · 2 of 2 positions shown · non-contrast
Comparison: Chest radiograph performed 07/15/2004

CLINICAL DATA: Chest pain.

CHEST - 2 VIEW

[w chest pa]
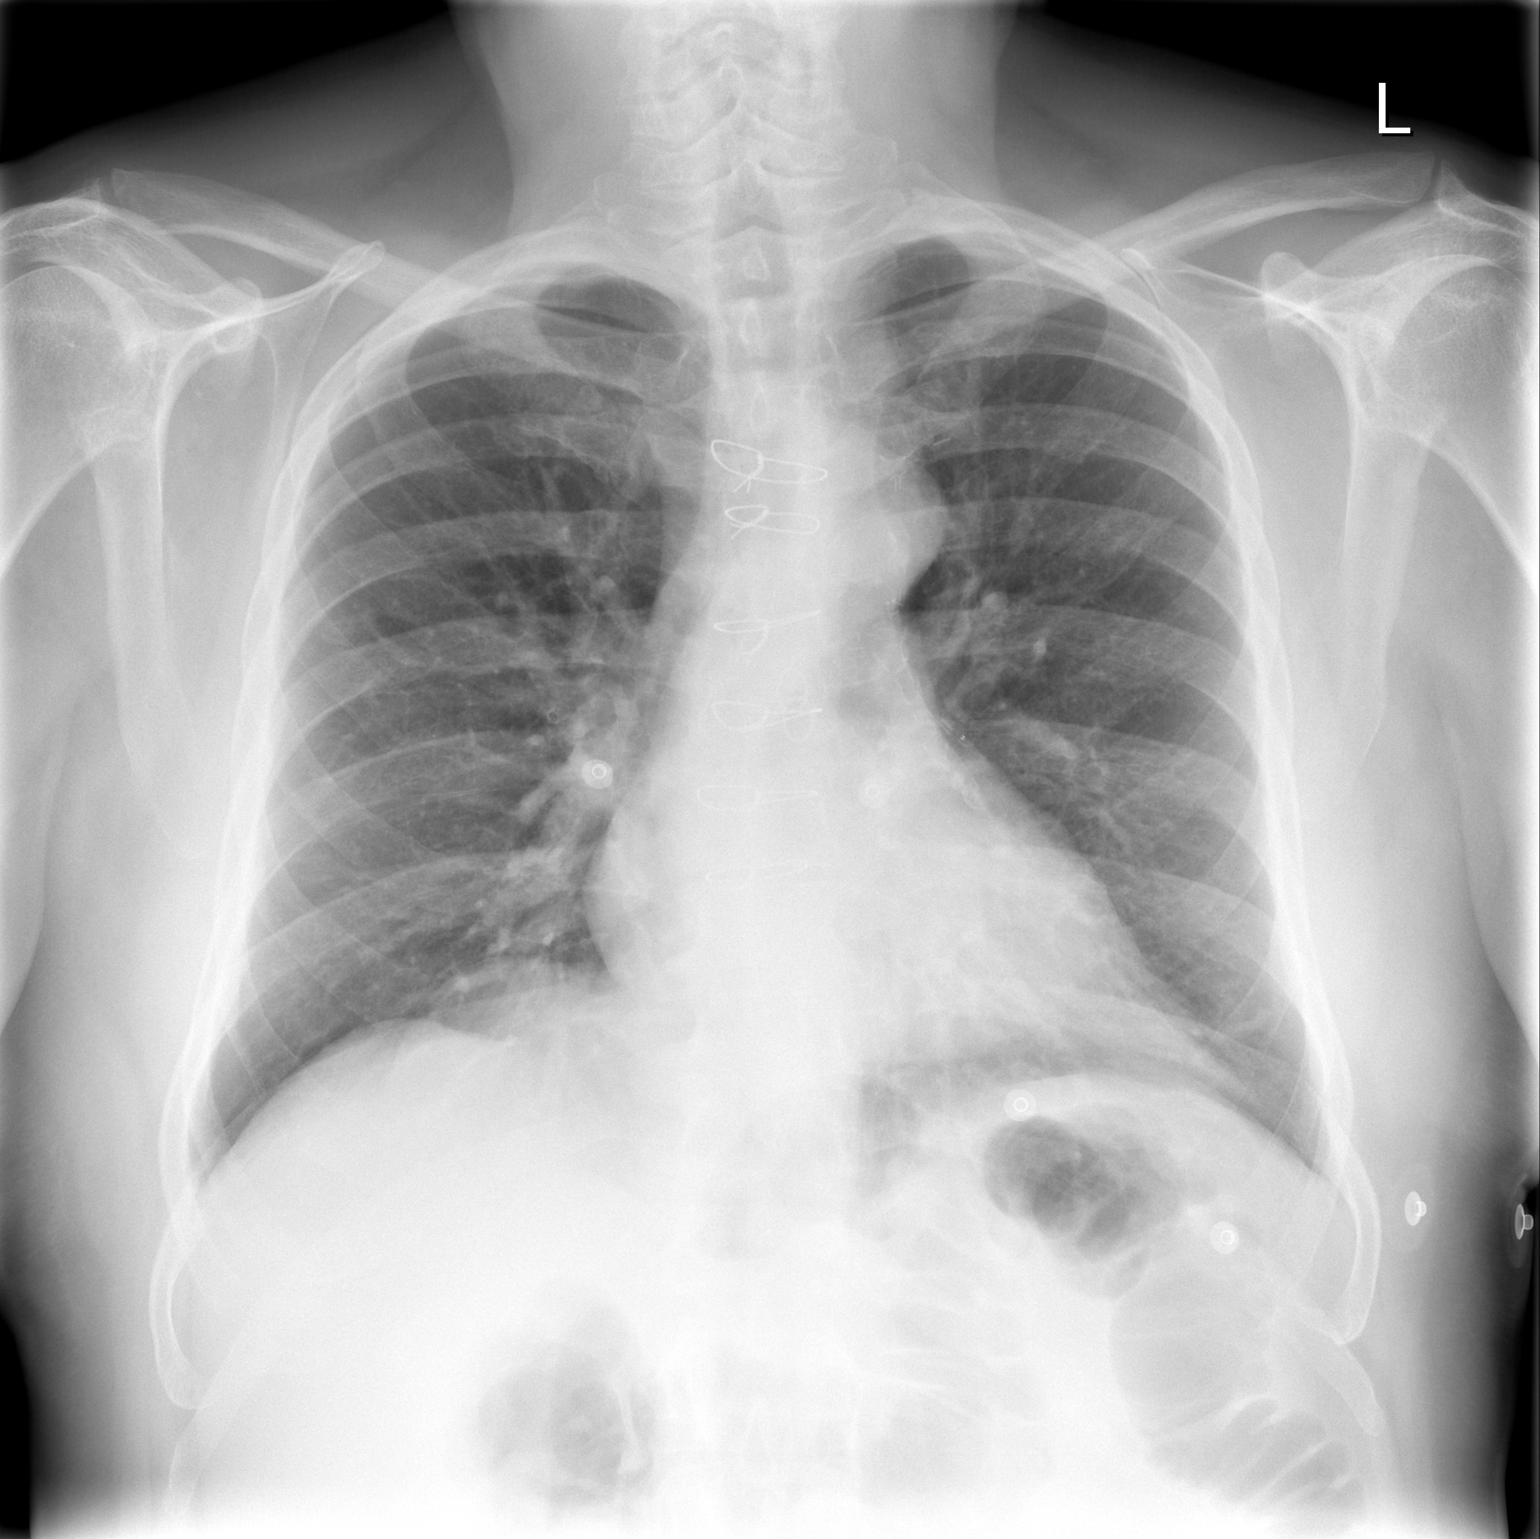

[w chest lat]
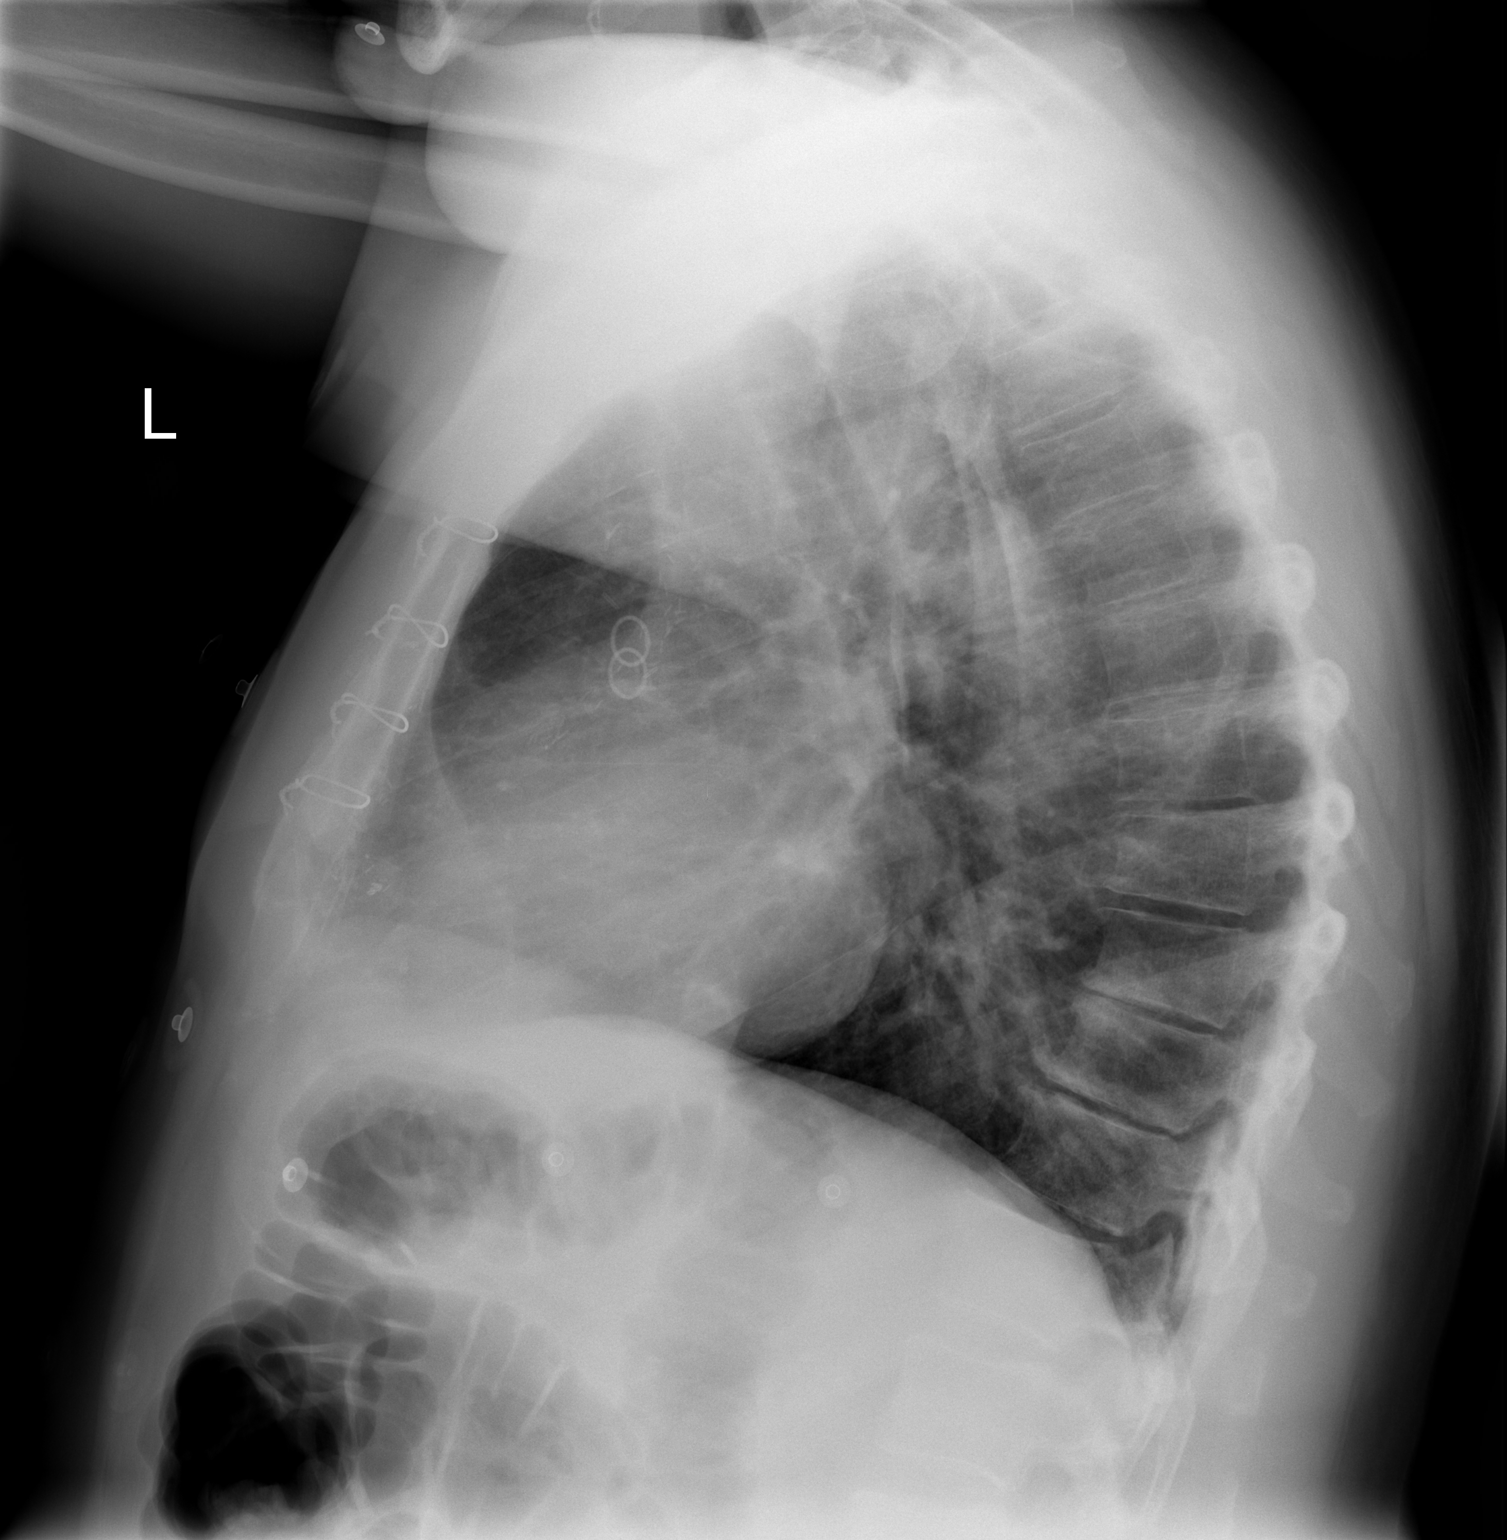

[2 of 2 positions shown; findings below may reference images not displayed]

FINDINGS: The lungs are well-aerated; minimal left basilar
atelectasis is noted.  There is no evidence of focal opacification,
pleural effusion or pneumothorax.

The heart is in the upper limits of normal in size; the mediastinal
contour is within normal limits.  The patient is status post median
sternotomy, with postoperative change reflecting prior CABG.  No
acute osseous abnormalities are seen.
IMPRESSION: Minimal left basilar atelectasis; otherwise unremarkable chest
radiograph.

## 2011-02-28 ENCOUNTER — Ambulatory Visit (INDEPENDENT_AMBULATORY_CARE_PROVIDER_SITE_OTHER): Payer: Medicare Other | Admitting: *Deleted

## 2011-02-28 DIAGNOSIS — I4891 Unspecified atrial fibrillation: Secondary | ICD-10-CM

## 2011-02-28 DIAGNOSIS — Z7901 Long term (current) use of anticoagulants: Secondary | ICD-10-CM

## 2011-03-06 HISTORY — PX: CARDIAC CATHETERIZATION: SHX172

## 2011-03-11 ENCOUNTER — Other Ambulatory Visit: Payer: Self-pay | Admitting: Cardiology

## 2011-03-18 ENCOUNTER — Emergency Department (HOSPITAL_COMMUNITY): Payer: Medicare Other

## 2011-03-18 ENCOUNTER — Inpatient Hospital Stay (HOSPITAL_COMMUNITY)
Admission: EM | Admit: 2011-03-18 | Discharge: 2011-03-21 | DRG: 287 | Disposition: A | Discharge status: Home / Self Care | Payer: Medicare Other | Attending: Cardiology | Admitting: Cardiology

## 2011-03-18 DIAGNOSIS — E119 Type 2 diabetes mellitus without complications: Secondary | ICD-10-CM | POA: Diagnosis present

## 2011-03-18 DIAGNOSIS — M109 Gout, unspecified: Secondary | ICD-10-CM | POA: Diagnosis present

## 2011-03-18 DIAGNOSIS — E785 Hyperlipidemia, unspecified: Secondary | ICD-10-CM | POA: Diagnosis present

## 2011-03-18 DIAGNOSIS — I252 Old myocardial infarction: Secondary | ICD-10-CM

## 2011-03-18 DIAGNOSIS — R0789 Other chest pain: Principal | ICD-10-CM | POA: Diagnosis present

## 2011-03-18 DIAGNOSIS — Z79899 Other long term (current) drug therapy: Secondary | ICD-10-CM

## 2011-03-18 DIAGNOSIS — I10 Essential (primary) hypertension: Secondary | ICD-10-CM | POA: Diagnosis present

## 2011-03-18 DIAGNOSIS — I2510 Atherosclerotic heart disease of native coronary artery without angina pectoris: Secondary | ICD-10-CM | POA: Diagnosis present

## 2011-03-18 DIAGNOSIS — R079 Chest pain, unspecified: Secondary | ICD-10-CM

## 2011-03-18 DIAGNOSIS — Z7901 Long term (current) use of anticoagulants: Secondary | ICD-10-CM

## 2011-03-18 DIAGNOSIS — E669 Obesity, unspecified: Secondary | ICD-10-CM | POA: Diagnosis present

## 2011-03-18 DIAGNOSIS — I25810 Atherosclerosis of coronary artery bypass graft(s) without angina pectoris: Secondary | ICD-10-CM | POA: Diagnosis present

## 2011-03-18 DIAGNOSIS — Z8249 Family history of ischemic heart disease and other diseases of the circulatory system: Secondary | ICD-10-CM

## 2011-03-18 DIAGNOSIS — Z7982 Long term (current) use of aspirin: Secondary | ICD-10-CM

## 2011-03-18 DIAGNOSIS — I4891 Unspecified atrial fibrillation: Secondary | ICD-10-CM | POA: Diagnosis present

## 2011-03-18 DIAGNOSIS — M1990 Unspecified osteoarthritis, unspecified site: Secondary | ICD-10-CM | POA: Diagnosis present

## 2011-03-18 LAB — CBC
MCH: 32 pg (ref 26.0–34.0)
MCHC: 35.9 g/dL (ref 30.0–36.0)
MCV: 89.1 fL (ref 78.0–100.0)
Platelets: 191 10*3/uL (ref 150–400)
RBC: 4.41 MIL/uL (ref 4.22–5.81)

## 2011-03-18 LAB — DIFFERENTIAL
Basophils Relative: 0 % (ref 0–1)
Eosinophils Absolute: 0.1 10*3/uL (ref 0.0–0.7)
Eosinophils Relative: 1 % (ref 0–5)
Lymphs Abs: 2.4 10*3/uL (ref 0.7–4.0)
Monocytes Absolute: 1.3 10*3/uL — ABNORMAL HIGH (ref 0.1–1.0)
Monocytes Relative: 9 % (ref 3–12)
Neutrophils Relative %: 73 % (ref 43–77)

## 2011-03-19 DIAGNOSIS — R079 Chest pain, unspecified: Secondary | ICD-10-CM

## 2011-03-19 LAB — URINALYSIS, ROUTINE W REFLEX MICROSCOPIC
Glucose, UA: NEGATIVE mg/dL
Hgb urine dipstick: NEGATIVE
Leukocytes, UA: NEGATIVE
Protein, ur: NEGATIVE mg/dL
Specific Gravity, Urine: 1.012 (ref 1.005–1.030)
Urobilinogen, UA: 0.2 mg/dL (ref 0.0–1.0)

## 2011-03-19 LAB — GLUCOSE, CAPILLARY
Glucose-Capillary: 111 mg/dL — ABNORMAL HIGH (ref 70–99)
Glucose-Capillary: 117 mg/dL — ABNORMAL HIGH (ref 70–99)

## 2011-03-19 LAB — CK TOTAL AND CKMB (NOT AT ARMC): CK, MB: 3.3 ng/mL (ref 0.3–4.0)

## 2011-03-19 LAB — BASIC METABOLIC PANEL
BUN: 16 mg/dL (ref 6–23)
CO2: 28 mEq/L (ref 19–32)
Chloride: 105 mEq/L (ref 96–112)
Creatinine, Ser: 0.94 mg/dL (ref 0.50–1.35)
Glucose, Bld: 126 mg/dL — ABNORMAL HIGH (ref 70–99)

## 2011-03-19 LAB — CBC
HCT: 37.2 % — ABNORMAL LOW (ref 39.0–52.0)
MCH: 31.2 pg (ref 26.0–34.0)
MCV: 90.1 fL (ref 78.0–100.0)
RBC: 4.13 MIL/uL — ABNORMAL LOW (ref 4.22–5.81)
WBC: 9.7 10*3/uL (ref 4.0–10.5)

## 2011-03-19 LAB — COMPREHENSIVE METABOLIC PANEL
ALT: 28 U/L (ref 0–53)
AST: 27 U/L (ref 0–37)
Alkaline Phosphatase: 70 U/L (ref 39–117)
CO2: 27 mEq/L (ref 19–32)
Calcium: 9.4 mg/dL (ref 8.4–10.5)
Potassium: 4.1 mEq/L (ref 3.5–5.1)
Sodium: 136 mEq/L (ref 135–145)
Total Protein: 7.2 g/dL (ref 6.0–8.3)

## 2011-03-19 LAB — LIPID PANEL
LDL Cholesterol: 56 mg/dL (ref 0–99)
Total CHOL/HDL Ratio: 2.8 RATIO
VLDL: 17 mg/dL (ref 0–40)

## 2011-03-19 LAB — PROTIME-INR: Prothrombin Time: 21.8 seconds — ABNORMAL HIGH (ref 11.6–15.2)

## 2011-03-20 LAB — PROTIME-INR: INR: 1.64 — ABNORMAL HIGH (ref 0.00–1.49)

## 2011-03-20 LAB — CBC
HCT: 37.1 % — ABNORMAL LOW (ref 39.0–52.0)
MCH: 31.3 pg (ref 26.0–34.0)
MCHC: 34.5 g/dL (ref 30.0–36.0)
MCV: 90.7 fL (ref 78.0–100.0)
RDW: 13.2 % (ref 11.5–15.5)
WBC: 9.2 10*3/uL (ref 4.0–10.5)

## 2011-03-20 LAB — BASIC METABOLIC PANEL
BUN: 17 mg/dL (ref 6–23)
GFR calc non Af Amer: >60 mL/min (ref >60)
Glucose, Bld: 114 mg/dL — ABNORMAL HIGH (ref 70–99)
Potassium: 4.6 mEq/L (ref 3.5–5.1)

## 2011-03-20 LAB — GLUCOSE, CAPILLARY
Glucose-Capillary: 171 mg/dL — ABNORMAL HIGH (ref 70–99)
Glucose-Capillary: 144 mg/dL — ABNORMAL HIGH (ref 70–99)

## 2011-03-21 DIAGNOSIS — I2510 Atherosclerotic heart disease of native coronary artery without angina pectoris: Secondary | ICD-10-CM

## 2011-03-21 LAB — POCT ACTIVATED CLOTTING TIME: Activated Clotting Time: 160 seconds

## 2011-03-21 LAB — CBC
HCT: 39.6 % (ref 39.0–52.0)
Hemoglobin: 13.9 g/dL (ref 13.0–17.0)
MCHC: 35.1 g/dL (ref 30.0–36.0)
MCV: 89.8 fL (ref 78.0–100.0)
RDW: 13.2 % (ref 11.5–15.5)

## 2011-03-21 LAB — HEPARIN LEVEL (UNFRACTIONATED): Heparin Unfractionated: 0.21 IU/mL — ABNORMAL LOW (ref 0.30–0.70)

## 2011-03-21 LAB — GLUCOSE, CAPILLARY

## 2011-03-24 NOTE — Cardiovascular Report (Signed)
NAMENAZAR, KUAN            ACCOUNT NO.:  [id number]  MEDICAL RECORD NO.:  [id number]  LOCATION:  2005                         FACILITY:  [facility]  PHYSICIAN:  Vesta Mixer, M.D. DATE OF BIRTH:  May 01, 1941  DATE OF PROCEDURE:  03/21/2011 DATE OF DISCHARGE:  03/21/2011                           CARDIAC CATHETERIZATION   [name] is a 70 year old gentleman with a history of coronary artery disease.  He is status post coronary artery bypass grafting.  His saphenous vein grafts were noted to be occluded.  The patient presents with some atypical episodes of chest pain.  His cardiac enzymes have been negative.  He is referred for cardiac catheterization for further evaluation.  PROCEDURES:  Left heart catheterization with coronary angiography. Right femoral artery was easily cannulated using modified Seldinger technique.  HEMODYNAMICS:  LV pressure is 113/11.  The aortic pressure is 115/55.  ANGIOGRAPHY:  Left main:  The left main is extremely short and is normal.  The LAD and left circumflex artery almost originate at separate ostia.  The left anterior descending artery is normal in its proximal segment. It gives off a moderate-sized first diagonal branch, which has moderate irregularities in the proximal segment.  It also gives off a small septal branch and is then occluded.  The left circumflex artery is large and dominant.  It has minor luminal irregularities throughout the course of the circumflex artery.  The obtuse marginal artery has a severe stenosis of between 89%.  This stenosis is basically unchanged from his previous heart catheterization in 2010.  The posterior descending artery and the posterolateral segment artery are unremarkable.  The right coronary artery is small to moderate in size and is nondominant.  There are minor luminal irregularities.  It supplies RV marginal branches primarily.  The left internal mammary artery was not able to be  engaged completely. Nonselective images of the left internal mammary artery were obtained. It reveals a normal left internal mammary artery with a good anastomosis of the LAD.  The LAD has minor luminal irregularities, but no critical stenosis.  The flow down the LAD is brisk.  We were able to identify the vertebral artery, which has a tight 70-80% stenosis at its origin.  The saphenous vein grafts were not injected because they were noted to be occluded from his previous heart catheterization.  Left ventriculogram:  The left ventriculogram was performed in the 30 RAO position.  It reveals normal left ventricular systolic function. Ejection fraction is 65%.  COMPLICATIONS:  None.  CONCLUSION:  Severe coronary artery disease - status post coronary artery bypass grafting.  The two saphenous vein grafts are closed and this is known from previous heart catheterization.  He has a tight first obtuse marginal artery stenosis, but this is unchanged from his previous heart catheterization.  He has done well on medical therapy.  He has a patent LIMA to LAD.  This appears to be unchanged from his previous heart catheterization.  He has normal left ventricular systolic function.  We will continue with medical therapy.  We will anticipate that he will be discharged today.  We will restart his Coumadin.  He will see Dr. Swaziland in the office in several weeks.  We will see him in [facility] in 1 week.     Vesta Mixer, M.D.     PJN/MEDQ  D:  03/21/2011  T:  03/21/2011  Job:  [id number]  cc:   Dr. Ander Slade  Electronically Signed by Kristeen Miss M.D. on 03/24/2011 02:46:26 PM

## 2011-03-24 NOTE — Discharge Summary (Addendum)
[name], [name]            ACCOUNT NO.:  [id number]  MEDICAL RECORD NO.:  [id number]  LOCATION:  2005                         FACILITY:  [facility]  PHYSICIAN:  Vesta Mixer, M.D. DATE OF BIRTH:  10-16-1940  DATE OF ADMISSION:  03/18/2011 DATE OF DISCHARGE:  03/21/2011                              DISCHARGE SUMMARY   DISCHARGE DIAGNOSES: 1. Chest pain.     a.     Negative cardiac enzymes.     b.     Heart catheterization on March 21, 2011, demonstrating stable      coronary anatomy, for continued medical therapy. 2. Coronary artery disease, status post coronary artery bypass graft     surgery in 2005. 3. Paroxysmal atrial fibrillation. 4. Diabetes mellitus. 5. Hypertension. 6. Hyperlipidemia. 7. Obesity. 8. Gout. 9. Osteoarthritis.  HOSPITAL COURSE:  [name] is a 70 year old gentleman with a history of CAD, PAF, diabetes, hypertension, hyperlipidemia who presented to the [facility] with complaints of substernal left-sided chest pain radiating to the neck and while he was watching TV.  He reported mild shortness of breath, but denied any nausea, vomiting, or diaphoresis. However, he did report that this pain was similar to his prior MI, but was less intensity.  Given the similarity of his pain with prior MI, felt concerning cardiac etiology and origin, so he was admitted to the hospital for further rule out.  Cardiac enzymes were negative x2.  He was continued on his aspirin, beta blocker, and statin.  His Coumadin was held and suspicion for heart catheterization, and he was bridged with heparin.  He was observed over the week and with no further complaints of chest pain.  He underwent heart catheterization today at March 21, 2011, demonstrating known coronary artery disease, unchanged from previous cath in 2010, stenoses similar to that catheterization. Recommendation was to continue medical therapy and discharged home today.  Dr. Elease Hashimoto felt that he  could resume Coumadin and his home dose does not need for bridging with Lovenox.  Dr.Nahser seen and examined him today, feels he is stable for discharge.  DISCHARGE LABORATORY DATA:  WBC 10.1, hemoglobin 13.9, hematocrit 39.6, platelet count 193.  Sodium 140, potassium 4.6, chloride 105, CO2 29, glucose 114, BUN 16, creatinine 1.03.  LFTs were normal.  Troponins negative x2.  Total cholesterol 140, triglycerides 84, HDL 41, LDL 56. TSH 0.882.  UA was negative.  STUDIES: 1. Cardiac catheterization on March 19, 2011, please see full report     for details. 2. Chest x-ray on March 19, 2011, showed no active disease.  DISCHARGE MEDICATIONS: 1. Acetaminophen 2 tablets by mouth every morning as needed. 2. Allopurinol 300 mg daily. 3. Ascorbic acid 1000 mg daily. 4. Aspirin 81 mg daily. 5. Clobetasol cream 1 application topically daily as needed for eczema     or itchy skin. 6. Caduet 10/40 mg daily. 7. Clonidine 0.1 mg b.i.d. 8. Coumadin 5 mg one-half tablets on Wednesday, 1 tablet follow other     days of the week. 9. __________ proximal application topically daily as needed for     eczema. 10.Dextromethorphan/guaifenesin 1 tablespoon by mouth every 4 hours as     needed for cough  congestion. 11.Furosemide 20 mg every morning. 12.Fish oil 1000 mg b.i.d. 13.Guaifenesin 400 mg 1-2 tablets q.4 hours p.r.n. congestion. 14.Garlic 650 mg daily. 15.Imdur 30 mg daily. 16.Lisinopril 40 mg daily. 17.Metformin ER 500 mg 2 tablets every morning with the instructions     not to resume until March 24, 2011. 18.Multivitamin 1 tablet daily. 19.Niaspan 500 mg daily. 20.Nitroglycerin sublingual 0.4 mg every 5 minutes as needed up to 3     doses for chest pain. 21.Toprol-XL 100 mg daily. 22.Vitamin B complex 1 tablet daily every morning.  DISPOSITION:  [name] will be discharged in stable condition to home, and is to increase activity slowly. He is to follow a low-sodium heart-healthy diet,  and call or return if he had any pain, swelling, bleeding or pus at his cath site.  He will see Norma Fredrickson, NP, and follow up with Dr. Elvis Coil office on April 08, 2011, at 11 a.m.  He will follow with Dr. Elvis Coil office for an INR check on March 28, 2011, at 8:45 a.m.  DURATION OF DISCHARGE ENCOUNTER:  Greater than 30 minutes including physician and PA time.     Ronie Spies, P.A.C.   ______________________________ Vesta Mixer, M.D.    DD/MEDQ  D:  03/21/2011  T:  03/22/2011  Job:  [id number]  cc:   Peter M. Sw[place]iland, M.D.  Electronically Signed by Kristeen Miss M.D. on 03/24/2011 02:46:30 PM Electronically Signed by Ronie Spies  on 03/26/2011 02:16:27 PM

## 2011-03-25 ENCOUNTER — Telehealth: Payer: Self-pay | Admitting: Cardiology

## 2011-03-25 NOTE — Telephone Encounter (Signed)
Patient called to cancel his INR appointment on 03/28/11. Please call back. I have pulled his chart.

## 2011-03-28 ENCOUNTER — Telehealth: Payer: Self-pay | Admitting: *Deleted

## 2011-03-28 ENCOUNTER — Encounter: Payer: Medicare Other | Admitting: *Deleted

## 2011-03-28 LAB — POCT INR: INR: 1.8

## 2011-03-28 NOTE — Telephone Encounter (Signed)
Pt called stating he will have his INR drawn in [place] at [facility] lab and the results will be faxed here-per Synetta Fail, RN

## 2011-03-29 ENCOUNTER — Ambulatory Visit (INDEPENDENT_AMBULATORY_CARE_PROVIDER_SITE_OTHER): Payer: Medicare Other | Admitting: *Deleted

## 2011-03-29 DIAGNOSIS — R0989 Other specified symptoms and signs involving the circulatory and respiratory systems: Secondary | ICD-10-CM

## 2011-03-29 DIAGNOSIS — I4891 Unspecified atrial fibrillation: Secondary | ICD-10-CM

## 2011-03-30 ENCOUNTER — Encounter: Payer: Self-pay | Admitting: Cardiology

## 2011-04-08 ENCOUNTER — Ambulatory Visit (INDEPENDENT_AMBULATORY_CARE_PROVIDER_SITE_OTHER): Payer: Self-pay | Admitting: *Deleted

## 2011-04-08 ENCOUNTER — Encounter: Payer: Self-pay | Admitting: Nurse Practitioner

## 2011-04-08 ENCOUNTER — Ambulatory Visit (INDEPENDENT_AMBULATORY_CARE_PROVIDER_SITE_OTHER): Payer: Medicare Other | Admitting: Nurse Practitioner

## 2011-04-08 VITALS — BP 130/70 | HR 59 | Ht 69.0 in | Wt 219.4 lb

## 2011-04-08 DIAGNOSIS — I4891 Unspecified atrial fibrillation: Secondary | ICD-10-CM

## 2011-04-08 DIAGNOSIS — I2510 Atherosclerotic heart disease of native coronary artery without angina pectoris: Secondary | ICD-10-CM | POA: Insufficient documentation

## 2011-04-08 DIAGNOSIS — R079 Chest pain, unspecified: Secondary | ICD-10-CM

## 2011-04-08 DIAGNOSIS — I10 Essential (primary) hypertension: Secondary | ICD-10-CM

## 2011-04-08 DIAGNOSIS — E785 Hyperlipidemia, unspecified: Secondary | ICD-10-CM

## 2011-04-08 NOTE — Assessment & Plan Note (Signed)
He has PAF. He is back on his coumadin.

## 2011-04-08 NOTE — Assessment & Plan Note (Signed)
Lipids were checked during this past admission and look good. No change in his medicines.

## 2011-04-08 NOTE — Progress Notes (Signed)
[name] Date of Birth: 01/28/1941   History of Present Illness: [name] is seen today for a post hospitial visit. He is seen for Dr. Sw[place]iland. He had chest pain about 2 weeks ago. He had cardiac cath. His anatomy was stable. No change in his medicines. He is back on his coumadin. No further chest pain. Overall, he is doing better.   Current Outpatient Prescriptions on File Prior to Visit  Medication Sig Dispense Refill  . acetaminophen (TYLENOL) 325 MG suppository Place 325 mg rectally 2 (two) times daily as needed.        Marland Kitchen allopurinol (ZYLOPRIM) 300 MG tablet Take 1 tablet (300 mg total) by mouth daily.  90 tablet  3  . amLODipine-atorvastatin (CADUET) 10-40 MG per tablet Take 1 tablet by mouth Daily.      . Ascorbic Acid (VITAMIN C) 500 MG tablet Take 1,000 mg by mouth daily.       Marland Kitchen aspirin 81 MG tablet Take 81 mg by mouth daily.        Marland Kitchen b complex vitamins tablet Take 1 tablet by mouth daily.        . cloNIDine (CATAPRES) 0.1 MG tablet Take 1 tablet by mouth 2 (two) times daily.      . fish oil-omega-3 fatty acids 1000 MG capsule Take 1 g by mouth 2 (two) times daily.        . furosemide (LASIX) 20 MG tablet TAKE 1 TABLET DAILY  90 tablet  3  . Garlic-Lecthin 409-811 MG CAPS Take 1 capsule by mouth daily.        . isosorbide mononitrate (IMDUR) 30 MG 24 hr tablet Take 1 tablet by mouth Daily.      . Lancets (ONETOUCH ULTRASOFT) lancets       . lisinopril (PRINIVIL,ZESTRIL) 40 MG tablet Take 1 tablet (40 mg total) by mouth daily.  90 tablet  3  . metFORMIN (GLUCOPHAGE-XR) 500 MG 24 hr tablet Take 1 tablet (500 mg total) by mouth 2 (two) times daily.  180 tablet  3  . multivitamin (THERAGRAN) per tablet Take 1 tablet by mouth daily.        Marland Kitchen NIASPAN 500 MG CR tablet Take 1 tablet by mouth Daily.      . ONE TOUCH ULTRA TEST test strip       . TOPROL XL 100 MG 24 hr tablet Take 1 tablet by mouth Daily.      Marland Kitchen warfarin (COUMADIN) 5 MG tablet Take 1 tablet by mouth as  directed.      . doxycycline (DORYX) 100 MG EC tablet Take 100 mg by mouth 2 (two) times daily.          Allergies  Allergen Reactions  . Keflex     Past Medical History  Diagnosis Date  . Coronary artery disease   . Hypertension   . Diabetes mellitus   . Paroxysmal atrial fibrillation   . Current use of long term anticoagulation   . Dyslipidemia   . Edema   . Hyperlipidemia   . Gout   . History of acute myocardial infarction 11/2005    acute anteroseptal myocardial infarction  . Osteoarthritis   . Chest pain July 2012    s/p repeat cath; stable anatomy. Managed medically    Past Surgical History  Procedure Date  . Coronary artery bypass graft 2005  . Cardiac catheterization 05/18/2009  . Cardiac catheterization July 2012    Stable anatomy. Manage medically  History  Smoking status  . Former Smoker  . Types: Cigarettes  . Quit date: 09/05/1982  Smokeless tobacco  . Not on file    History  Alcohol Use No    Family History  Problem Relation Age of Onset  . Coronary artery disease Mother   . Heart disease Mother   . Coronary artery disease Father   . Heart disease Father     Review of Systems: The review of systems is as above. He has small knot in the right groin. He notes he had extensive bruising that is now resolved.  All other systems were reviewed and are negative.  Physical Exam: BP 130/70  Pulse 59  Ht 5\' 9"  (1.753 m)  Wt 219 lb 6.4 oz (99.519 kg)  BMI 32.40 kg/m2 Patient is very pleasant and in no acute distress. Skin is warm and dry. Color is normal.  HEENT is unremarkable. Normocephalic/atraumatic. PERRL. Sclera are nonicteric. Neck is supple. No masses. No JVD. Lungs are clear. Cardiac exam shows a regular rate and rhythm. Abdomen is soft. Extremities are without edema. Right groin shows small hematoma. No bruit. Gait and ROM are intact. No gross neurologic deficits noted.  LABORATORY DATA:   Assessment / Plan:

## 2011-04-08 NOTE — Assessment & Plan Note (Addendum)
He had prior CABG in 2005. He has had recent cath and has stable coronary anatomy. No change in his medicines. We will see him back in 3 months. He is encouraged to continue with efforts at cardiovascular risk factor modification. We will need to do further testing if he has recurrent symptoms.  Patient is agreeable to this plan and will call if any problems develop in the interim.

## 2011-04-08 NOTE — Patient Instructions (Signed)
Stay on your current medicines. I will have you see Dr. Swaziland in 3 months. Call for any problems.

## 2011-04-08 NOTE — Assessment & Plan Note (Signed)
Blood pressure looks good. No change in his medicines.

## 2011-05-06 ENCOUNTER — Encounter: Payer: Medicare Other | Admitting: *Deleted

## 2011-05-10 ENCOUNTER — Ambulatory Visit (INDEPENDENT_AMBULATORY_CARE_PROVIDER_SITE_OTHER): Payer: Medicare Other | Admitting: *Deleted

## 2011-05-10 DIAGNOSIS — I4891 Unspecified atrial fibrillation: Secondary | ICD-10-CM

## 2011-05-10 DIAGNOSIS — Z7901 Long term (current) use of anticoagulants: Secondary | ICD-10-CM

## 2011-05-10 LAB — POCT INR: INR: 2

## 2011-05-24 ENCOUNTER — Other Ambulatory Visit: Payer: Self-pay | Admitting: Cardiology

## 2011-05-24 NOTE — Telephone Encounter (Signed)
escribe medication per fax request  

## 2011-06-06 ENCOUNTER — Ambulatory Visit (INDEPENDENT_AMBULATORY_CARE_PROVIDER_SITE_OTHER): Payer: Medicare Other | Admitting: *Deleted

## 2011-06-06 DIAGNOSIS — Z7901 Long term (current) use of anticoagulants: Secondary | ICD-10-CM

## 2011-06-06 DIAGNOSIS — I4891 Unspecified atrial fibrillation: Secondary | ICD-10-CM

## 2011-07-11 ENCOUNTER — Encounter: Payer: 59 | Admitting: *Deleted

## 2011-07-11 ENCOUNTER — Other Ambulatory Visit: Payer: Self-pay | Admitting: Cardiology

## 2011-07-11 ENCOUNTER — Ambulatory Visit: Payer: Medicare Other | Admitting: Cardiology

## 2011-07-13 ENCOUNTER — Ambulatory Visit (INDEPENDENT_AMBULATORY_CARE_PROVIDER_SITE_OTHER): Payer: Medicare Other | Admitting: *Deleted

## 2011-07-13 DIAGNOSIS — I4891 Unspecified atrial fibrillation: Secondary | ICD-10-CM

## 2011-07-13 DIAGNOSIS — Z7901 Long term (current) use of anticoagulants: Secondary | ICD-10-CM

## 2011-07-13 LAB — POCT INR: INR: 2

## 2011-07-18 ENCOUNTER — Telehealth: Payer: Self-pay | Admitting: Cardiology

## 2011-07-18 NOTE — Telephone Encounter (Signed)
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 Has to be before 25th. Scheduled for 11/19 with Windell Moulding and Norma Fredrickson will monitor. Advised to eat light breakfast and to hold Toprol. Needs to be here by 10:45

## 2011-07-18 NOTE — Telephone Encounter (Signed)
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 His appt with Dr. Swaziland is on 11-30.  Can this be arranged.  Please call him and advise.  [phone]

## 2011-07-25 ENCOUNTER — Ambulatory Visit (INDEPENDENT_AMBULATORY_CARE_PROVIDER_SITE_OTHER): Payer: Medicare Other | Admitting: Nurse Practitioner

## 2011-07-25 DIAGNOSIS — I2510 Atherosclerotic heart disease of native coronary artery without angina pectoris: Secondary | ICD-10-CM

## 2011-07-25 NOTE — Patient Instructions (Signed)
Regular exercise is encouraged.  Continue with your current medicines.  We will see you back at your regular appointment time.   Call the [facility] office at [phone] if you have any questions, problems or concerns.

## 2011-07-25 NOTE — Progress Notes (Signed)
Exercise Treadmill Test  Pre-Exercise Testing Evaluation Rhythm: normal sinus  Rate: 60   PR:  .17 QRS:  .08  QT:  .42 QTc: .42     Test  Exercise Tolerance Test Ordering MD: Peter Swaziland M.D  Interpreting MD:  Norma Fredrickson NP  Unique Test No: 1  Treadmill:  1  Indication for ETT: known ASHD  Contraindication to ETT: No   Stress Modality: exercise - treadmill  Cardiac Imaging Performed: non   Protocol: standard Bruce - maximal  Max BP: 206/92  Max MPHR (bpm):  151 85% MPR (bpm):  128  MPHR obtained (bpm): 137 % MPHR obtained:  90%  Reached 85% MPHR (min:sec)  4:22 Total Exercise Time (min-sec):  6:00  Workload in METS:  7.0 Borg Scale: 15  Reason ETT Terminated:  patient's desire to stop    ST Segment Analysis At Rest: normal ST segments - no evidence of significant ST depression With Exercise: no evidence of significant ST depression  Other Information Arrhythmia:  No Angina during ETT:  absent (0) Quality of ETT:  diagnostic  ETT Interpretation:  normal - no evidence of ischemia by ST analysis  Comments: Patient has poor exercise tolerance overall with elevated blood pressure with exercise. No EKG changes noted to suggest ischemia. Rare PVC seen.   Recommendations: Stress test was reviewed with Dr. Swaziland. Encouraged to exercise with goal of 45 minutes a day. He will continue with his general cardiology follow up.

## 2011-08-05 ENCOUNTER — Encounter: Payer: Self-pay | Admitting: Cardiology

## 2011-08-05 ENCOUNTER — Ambulatory Visit (INDEPENDENT_AMBULATORY_CARE_PROVIDER_SITE_OTHER): Payer: Medicare Other | Admitting: Cardiology

## 2011-08-05 VITALS — BP 156/70 | HR 66 | Ht 69.5 in | Wt 217.0 lb

## 2011-08-05 DIAGNOSIS — Z951 Presence of aortocoronary bypass graft: Secondary | ICD-10-CM

## 2011-08-05 DIAGNOSIS — N529 Male erectile dysfunction, unspecified: Secondary | ICD-10-CM

## 2011-08-05 DIAGNOSIS — E785 Hyperlipidemia, unspecified: Secondary | ICD-10-CM

## 2011-08-05 DIAGNOSIS — I10 Essential (primary) hypertension: Secondary | ICD-10-CM

## 2011-08-05 DIAGNOSIS — I2510 Atherosclerotic heart disease of native coronary artery without angina pectoris: Secondary | ICD-10-CM

## 2011-08-05 DIAGNOSIS — I4891 Unspecified atrial fibrillation: Secondary | ICD-10-CM

## 2011-08-05 LAB — CBC WITH DIFFERENTIAL/PLATELET
Basophils Absolute: 0 10*3/uL (ref 0.0–0.1)
Basophils Relative: 0 % (ref 0–1)
Eosinophils Relative: 3 % (ref 0–5)
HCT: 44.2 % (ref 39.0–52.0)
Lymphocytes Relative: 35 % (ref 12–46)
MCHC: 33.9 g/dL (ref 30.0–36.0)
MCV: 91.9 fL (ref 78.0–100.0)
Monocytes Absolute: 1.2 10*3/uL — ABNORMAL HIGH (ref 0.1–1.0)
Platelets: 213 10*3/uL (ref 150–400)
RDW: 13.1 % (ref 11.5–15.5)
WBC: 7.8 10*3/uL (ref 4.0–10.5)

## 2011-08-05 LAB — BASIC METABOLIC PANEL
BUN: 12 mg/dL (ref 6–23)
Calcium: 9.7 mg/dL (ref 8.4–10.5)
Chloride: 102 mEq/L (ref 96–112)
Creat: 1.06 mg/dL (ref 0.50–1.35)

## 2011-08-05 MED ORDER — NITROGLYCERIN 0.4 MG SL SUBL: 0.4000 mg | SUBLINGUAL_TABLET | SUBLINGUAL | Status: DC | PRN

## 2011-08-05 MED ORDER — CLONIDINE HCL 0.1 MG PO TABS: 0.1000 mg | ORAL_TABLET | ORAL | Status: DC

## 2011-08-05 NOTE — Assessment & Plan Note (Signed)
Blood pressure is running higher now. We will check a CBC and bmet today. We will schedule him for renal duplex study. We'll increase his clonidine at bedtime to 0.2 mg.

## 2011-08-05 NOTE — Assessment & Plan Note (Signed)
Rate is well controlled and he is on anticoagulation. Continue his current therapy.

## 2011-08-05 NOTE — Progress Notes (Signed)
[name] Date of Birth: 1941/07/28   History of Present Illness: [name] is seen today for a followup visit. He has done very well since his cardiac catheterization this summer. He denies any recurrent chest pain. He does note that his blood pressure has been running high particularly in the mornings up to 160s systolic. There's been no change in his diet or exercise program. His medications are the same.  Current Outpatient Prescriptions on File Prior to Visit  Medication Sig Dispense Refill  . allopurinol (ZYLOPRIM) 300 MG tablet Take 1 tablet (300 mg total) by mouth daily.  90 tablet  3  . amLODipine-atorvastatin (CADUET) 10-40 MG per tablet TAKE 1 TABLET DAILY  90 tablet  2  . Ascorbic Acid (VITAMIN C) 500 MG tablet Take 1,000 mg by mouth daily.       Marland Kitchen aspirin 81 MG tablet Take 81 mg by mouth daily.        Marland Kitchen b complex vitamins tablet Take 1 tablet by mouth daily.        . fish oil-omega-3 fatty acids 1000 MG capsule Take 1 g by mouth 2 (two) times daily.        . furosemide (LASIX) 20 MG tablet TAKE 1 TABLET DAILY  90 tablet  3  . Garlic-Lecthin 161-096 MG CAPS Take 1 capsule by mouth daily.        . isosorbide mononitrate (IMDUR) 30 MG 24 hr tablet Take 1 tablet by mouth Daily.      . Lancets (ONETOUCH ULTRASOFT) lancets       . lisinopril (PRINIVIL,ZESTRIL) 40 MG tablet Take 1 tablet (40 mg total) by mouth daily.  90 tablet  3  . metFORMIN (GLUCOPHAGE-XR) 500 MG 24 hr tablet Take 1 tablet (500 mg total) by mouth 2 (two) times daily.  180 tablet  3  . metoprolol (TOPROL-XL) 100 MG 24 hr tablet TAKE 1 TABLET DAILY  90 tablet  3  . multivitamin (THERAGRAN) per tablet Take 1 tablet by mouth daily.        Marland Kitchen NIASPAN 500 MG CR tablet TAKE 1 TABLET AT BEDTIME  90 tablet  2  . ONE TOUCH ULTRA TEST test strip       . warfarin (COUMADIN) 5 MG tablet TAKE 1 & 1/2 (ONE AND ONE-HALF TABLETS) (7.5MG ) FOR ONE DAY AND 1 TABLET (5MG ) FOR SIX DAYS AS DIRECTED  96 tablet  1  . DISCONTD:  cloNIDine (CATAPRES) 0.1 MG tablet TAKE 1 TABLET TWICE A DAY  180 tablet  2    Allergies  Allergen Reactions  . Keflex     Past Medical History  Diagnosis Date  . Coronary artery disease   . Hypertension   . Diabetes mellitus   . Paroxysmal atrial fibrillation   . Current use of long term anticoagulation   . Dyslipidemia   . Edema   . Hyperlipidemia   . Gout   . History of acute myocardial infarction 11/2005    acute anteroseptal myocardial infarction  . Osteoarthritis   . Chest pain July 2012    s/p repeat cath; stable anatomy. Managed medically    Past Surgical History  Procedure Date  . Coronary artery bypass graft 2005  . Cardiac catheterization 05/18/2009  . Cardiac catheterization July 2012    Stable anatomy. Manage medically    History  Smoking status  . Former Smoker  . Types: Cigarettes  . Quit date: 09/05/1982  Smokeless tobacco  . Not on file  History  Alcohol Use No    Family History  Problem Relation Age of Onset  . Coronary artery disease Mother   . Heart disease Mother   . Coronary artery disease Father   . Heart disease Father   . Cancer Brother   . Cancer Brother     Review of Systems: The review of systems is as above. He does complain of erectile dysfunction.   All other systems were reviewed and are negative.  Physical Exam: BP 156/70  Pulse 66  Ht 5' 9.5" (1.765 m)  Wt 217 lb (98.431 kg)  BMI 31.59 kg/m2 Patient is very pleasant and in no acute distress. Skin is warm and dry. Color is normal.  HEENT is unremarkable. Normocephalic/atraumatic. PERRL. Sclera are nonicteric. Neck is supple. No masses. No JVD. Lungs are clear. Cardiac exam shows a regular rate and rhythm. Abdomen is soft. Extremities are without edema.  Gait and ROM are intact. No gross neurologic deficits noted.  LABORATORY DATA: Recent INR was 2.0.  Assessment / Plan:

## 2011-08-05 NOTE — Assessment & Plan Note (Signed)
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 He is without significant angina on medical therapy. We will continue his current treatment. As regards his erectile dysfunction he is not a candidate for Viagra-like medication because he is on chronic nitrates. We will arrange evaluation with urology for other options.

## 2011-08-05 NOTE — Patient Instructions (Signed)
Increase your clonidine to .1 mg in the morning and .2 mg in the evening.  We will check some blood work today.  We will schedule you for a renal doppler study.  I will see you again in 6 months. If your blood pressure does not improve let me know.

## 2011-08-05 NOTE — Assessment & Plan Note (Signed)
His last lipid panel in July looked excellent. We will continue with his current therapy.

## 2011-08-09 ENCOUNTER — Other Ambulatory Visit: Payer: Self-pay | Admitting: Cardiology

## 2011-08-10 ENCOUNTER — Telehealth: Payer: Self-pay | Admitting: *Deleted

## 2011-08-10 NOTE — Telephone Encounter (Signed)
Notified of lab results. Has not been set up w/urologist. Will check on status of scheduling app. Will send labs to Dr. Providence Lanius.

## 2011-08-10 NOTE — Telephone Encounter (Signed)
Message copied by Lorayne Bender on Wed Aug 10, 2011 10:22 AM ------      Message from: Sw[place]iland, PETER M      Created: Sun Aug 07, 2011  4:12 PM       CBC, bmet are normal except for elevated glucose.      Theron Arista Sw[place]iland

## 2011-08-22 ENCOUNTER — Ambulatory Visit (INDEPENDENT_AMBULATORY_CARE_PROVIDER_SITE_OTHER): Payer: Medicare Other | Admitting: *Deleted

## 2011-08-22 DIAGNOSIS — Z7901 Long term (current) use of anticoagulants: Secondary | ICD-10-CM

## 2011-08-22 DIAGNOSIS — I4891 Unspecified atrial fibrillation: Secondary | ICD-10-CM

## 2011-08-22 LAB — POCT INR: INR: 2.1

## 2011-09-15 ENCOUNTER — Encounter (INDEPENDENT_AMBULATORY_CARE_PROVIDER_SITE_OTHER): Payer: Medicare Other | Admitting: Cardiology

## 2011-09-15 DIAGNOSIS — I10 Essential (primary) hypertension: Secondary | ICD-10-CM

## 2011-10-03 ENCOUNTER — Encounter: Payer: Medicare Other | Admitting: *Deleted

## 2011-10-05 ENCOUNTER — Ambulatory Visit (INDEPENDENT_AMBULATORY_CARE_PROVIDER_SITE_OTHER): Payer: Medicare Other | Admitting: *Deleted

## 2011-10-05 DIAGNOSIS — Z7901 Long term (current) use of anticoagulants: Secondary | ICD-10-CM

## 2011-10-05 DIAGNOSIS — I4891 Unspecified atrial fibrillation: Secondary | ICD-10-CM

## 2011-10-05 LAB — POCT INR: INR: 2.1

## 2011-11-14 ENCOUNTER — Ambulatory Visit (INDEPENDENT_AMBULATORY_CARE_PROVIDER_SITE_OTHER): Payer: Medicare Other | Admitting: *Deleted

## 2011-11-14 DIAGNOSIS — I4891 Unspecified atrial fibrillation: Secondary | ICD-10-CM

## 2011-11-14 DIAGNOSIS — Z7901 Long term (current) use of anticoagulants: Secondary | ICD-10-CM

## 2011-11-14 LAB — POCT INR: INR: 2.4

## 2011-12-26 ENCOUNTER — Ambulatory Visit (INDEPENDENT_AMBULATORY_CARE_PROVIDER_SITE_OTHER): Payer: Medicare Other | Admitting: *Deleted

## 2011-12-26 DIAGNOSIS — Z7901 Long term (current) use of anticoagulants: Secondary | ICD-10-CM

## 2011-12-26 DIAGNOSIS — I4891 Unspecified atrial fibrillation: Secondary | ICD-10-CM

## 2012-01-16 ENCOUNTER — Other Ambulatory Visit: Payer: Self-pay | Admitting: Cardiology

## 2012-01-17 ENCOUNTER — Other Ambulatory Visit: Payer: Self-pay | Admitting: *Deleted

## 2012-02-06 ENCOUNTER — Other Ambulatory Visit: Payer: Self-pay | Admitting: Cardiology

## 2012-02-06 ENCOUNTER — Ambulatory Visit (INDEPENDENT_AMBULATORY_CARE_PROVIDER_SITE_OTHER): Payer: Medicare Other | Admitting: *Deleted

## 2012-02-06 DIAGNOSIS — I4891 Unspecified atrial fibrillation: Secondary | ICD-10-CM

## 2012-02-06 DIAGNOSIS — Z7901 Long term (current) use of anticoagulants: Secondary | ICD-10-CM

## 2012-02-06 NOTE — Telephone Encounter (Signed)
..   Requested Prescriptions   Pending Prescriptions Disp Refills  . furosemide (LASIX) 20 MG tablet [Pharmacy Med Name: FUROSEMIDE TABS 20MG ] 90 tablet 0    Sig: TAKE 1 TABLET DAILY

## 2012-02-07 ENCOUNTER — Telehealth: Payer: Self-pay | Admitting: Cardiology

## 2012-02-07 NOTE — Telephone Encounter (Signed)
New msg Pt wants refill of allopurinol called to  [facility] on [address]

## 2012-02-08 ENCOUNTER — Other Ambulatory Visit: Payer: Self-pay | Admitting: *Deleted

## 2012-02-08 MED ORDER — ALLOPURINOL 300 MG PO TABS: 300.0000 mg | ORAL_TABLET | Freq: Every day | ORAL | Status: DC

## 2012-02-27 ENCOUNTER — Ambulatory Visit (INDEPENDENT_AMBULATORY_CARE_PROVIDER_SITE_OTHER): Payer: Medicare Other | Admitting: *Deleted

## 2012-02-27 DIAGNOSIS — I4891 Unspecified atrial fibrillation: Secondary | ICD-10-CM

## 2012-02-27 DIAGNOSIS — Z7901 Long term (current) use of anticoagulants: Secondary | ICD-10-CM

## 2012-03-25 ENCOUNTER — Other Ambulatory Visit: Payer: Self-pay | Admitting: Cardiology

## 2012-03-30 ENCOUNTER — Encounter: Payer: Self-pay | Admitting: Cardiology

## 2012-03-30 ENCOUNTER — Ambulatory Visit (INDEPENDENT_AMBULATORY_CARE_PROVIDER_SITE_OTHER): Payer: Medicare Other | Admitting: *Deleted

## 2012-03-30 ENCOUNTER — Ambulatory Visit (INDEPENDENT_AMBULATORY_CARE_PROVIDER_SITE_OTHER): Payer: Medicare Other | Admitting: Cardiology

## 2012-03-30 VITALS — BP 140/68 | HR 62 | Ht 69.0 in | Wt 217.6 lb

## 2012-03-30 DIAGNOSIS — E785 Hyperlipidemia, unspecified: Secondary | ICD-10-CM

## 2012-03-30 DIAGNOSIS — I4891 Unspecified atrial fibrillation: Secondary | ICD-10-CM

## 2012-03-30 DIAGNOSIS — Z7901 Long term (current) use of anticoagulants: Secondary | ICD-10-CM

## 2012-03-30 DIAGNOSIS — E7800 Pure hypercholesterolemia, unspecified: Secondary | ICD-10-CM

## 2012-03-30 DIAGNOSIS — I2510 Atherosclerotic heart disease of native coronary artery without angina pectoris: Secondary | ICD-10-CM

## 2012-03-30 DIAGNOSIS — I10 Essential (primary) hypertension: Secondary | ICD-10-CM

## 2012-03-30 NOTE — Patient Instructions (Signed)
Continue your current medication.  I will see you again in 6 months.   

## 2012-04-01 NOTE — Assessment & Plan Note (Signed)
Has been over a year since his lipids were last checked. We will schedule him for fasting lipid panel. His recent hepatic function profile was normal.

## 2012-04-01 NOTE — Assessment & Plan Note (Signed)
Cardiac catheterization in July of 2012 showed stable coronary disease. He is asymptomatic at this point. We will continue with medical therapy including metoprolol, isosorbide, and amlodipine. Continue statin therapy.

## 2012-04-01 NOTE — Assessment & Plan Note (Signed)
He has had no significant symptomatic episodes of atrial fibrillation. We will continue therapy with metoprolol and Coumadin.

## 2012-04-01 NOTE — Progress Notes (Signed)
Winn Jock Date of Birth: 1941-06-19   History of Present Illness: Mr. Fritze is seen today for a followup visit. He reports that he is doing very well. He is on only rare symptoms of flutter in his chest that only lasts a few seconds. He's had no sustained tachycardia. He denies any recurrent chest pain. He feels well in general.  Current Outpatient Prescriptions on File Prior to Visit  Medication Sig Dispense Refill  . acetaminophen (TYLENOL) 500 MG tablet Take 500 mg by mouth every 6 (six) hours as needed.        Marland Kitchen allopurinol (ZYLOPRIM) 300 MG tablet Take 1 tablet (300 mg total) by mouth daily.  90 tablet  0  . amLODipine-atorvastatin (CADUET) 10-40 MG per tablet TAKE 1 TABLET DAILY  90 tablet  2  . Ascorbic Acid (VITAMIN C) 500 MG tablet Take 1,000 mg by mouth daily.       Marland Kitchen aspirin 81 MG tablet Take 81 mg by mouth daily.        Marland Kitchen b complex vitamins tablet Take 1 tablet by mouth daily.        . cloNIDine (CATAPRES) 0.1 MG tablet Take 1 tablet (0.1 mg total) by mouth as directed.  270 tablet  3  . fish oil-omega-3 fatty acids 1000 MG capsule Take 1 g by mouth 2 (two) times daily.        . furosemide (LASIX) 20 MG tablet TAKE 1 TABLET DAILY  90 tablet  0  . Garlic-Lecthin 914-782 MG CAPS Take 1 capsule by mouth daily.        . isosorbide mononitrate (IMDUR) 30 MG 24 hr tablet TAKE 1 TABLET DAILY  90 tablet  2  . Lancets (ONETOUCH ULTRASOFT) lancets       . lisinopril (PRINIVIL,ZESTRIL) 40 MG tablet TAKE 1 TABLET DAILY  90 tablet  2  . metFORMIN (GLUCOPHAGE-XR) 500 MG 24 hr tablet Take 1 tablet (500 mg total) by mouth 2 (two) times daily.  180 tablet  3  . metoprolol succinate (TOPROL-XL) 100 MG 24 hr tablet TAKE 1 TABLET DAILY  90 tablet  2  . multivitamin (THERAGRAN) per tablet Take 1 tablet by mouth daily.        Marland Kitchen NIASPAN 500 MG CR tablet TAKE 1 TABLET AT BEDTIME  90 tablet  2  . nitroGLYCERIN (NITROSTAT) 0.4 MG SL tablet Place 1 tablet (0.4 mg total) under the tongue  every 5 (five) minutes as needed for chest pain.  100 tablet  3  . ONE TOUCH ULTRA TEST test strip       . warfarin (COUMADIN) 5 MG tablet TAKE 1 & 1/2 (ONE AND ONE-HALF TABLETS) (7.5MG ) FOR ONE DAY AND 1 TABLET (5MG ) FOR SIX DAYS AS DIRECTED  96 tablet  1    Allergies  Allergen Reactions  . Cephalexin     Past Medical History  Diagnosis Date  . Coronary artery disease   . Hypertension   . Diabetes mellitus   . Paroxysmal atrial fibrillation   . Current use of long term anticoagulation   . Dyslipidemia   . Edema   . Hyperlipidemia   . Gout   . History of acute myocardial infarction 11/2005    acute anteroseptal myocardial infarction  . Osteoarthritis   . Chest pain July 2012    s/p repeat cath; stable anatomy. Managed medically    Past Surgical History  Procedure Date  . Coronary artery bypass graft 2005  . Cardiac  catheterization 05/18/2009  . Cardiac catheterization July 2012    Stable anatomy. Manage medically    History  Smoking status  . Former Smoker  . Types: Cigarettes  . Quit date: 09/05/1982  Smokeless tobacco  . Never Used    History  Alcohol Use No    Family History  Problem Relation Age of Onset  . Coronary artery disease Mother   . Heart disease Mother   . Coronary artery disease Father   . Heart disease Father   . Cancer Brother   . Cancer Brother     Review of Systems: The review of systems is as above. He does complain of erectile dysfunction.   All other systems were reviewed and are negative.  Physical Exam: BP 140/68  Pulse 62  Ht 5\' 9"  (1.753 m)  Wt 217 lb 9.6 oz (98.703 kg)  BMI 32.13 kg/m2 Patient is very pleasant and in no acute distress. Skin is warm and dry. Color is normal.  HEENT is unremarkable. Normocephalic/atraumatic. PERRL. Sclera are nonicteric. Neck is supple. No masses. No JVD. Lungs are clear. Cardiac exam shows a regular rate and rhythm. Abdomen is soft. Extremities are without edema.  Gait and ROM are intact. No  gross neurologic deficits noted.  LABORATORY DATA: INR today is 2.3  Laboratory data from May of 2013 shows a PSA of 0.8. A1c was 6.6%. Glucose 141. All other chemistries were normal.  Assessment / Plan:

## 2012-04-02 ENCOUNTER — Other Ambulatory Visit (INDEPENDENT_AMBULATORY_CARE_PROVIDER_SITE_OTHER): Payer: Medicare Other

## 2012-04-02 DIAGNOSIS — E7800 Pure hypercholesterolemia, unspecified: Secondary | ICD-10-CM

## 2012-04-02 DIAGNOSIS — I2510 Atherosclerotic heart disease of native coronary artery without angina pectoris: Secondary | ICD-10-CM

## 2012-04-02 DIAGNOSIS — I4891 Unspecified atrial fibrillation: Secondary | ICD-10-CM

## 2012-04-02 DIAGNOSIS — I10 Essential (primary) hypertension: Secondary | ICD-10-CM

## 2012-04-02 LAB — LIPID PANEL
Cholesterol: 135 mg/dL (ref 0–200)
LDL Cholesterol: 68 mg/dL (ref 0–99)
Total CHOL/HDL Ratio: 3
Triglycerides: 130 mg/dL (ref 0.0–149.0)
VLDL: 26 mg/dL (ref 0.0–40.0)

## 2012-04-05 DIAGNOSIS — E1169 Type 2 diabetes mellitus with other specified complication: Secondary | ICD-10-CM | POA: Insufficient documentation

## 2012-04-07 ENCOUNTER — Other Ambulatory Visit: Payer: Self-pay | Admitting: Cardiology

## 2012-04-09 NOTE — Telephone Encounter (Signed)
Fax Received. Refill Completed. [name] (R.M.A)   

## 2012-04-20 ENCOUNTER — Other Ambulatory Visit: Payer: Self-pay | Admitting: Cardiology

## 2012-04-30 ENCOUNTER — Ambulatory Visit (INDEPENDENT_AMBULATORY_CARE_PROVIDER_SITE_OTHER): Payer: Medicare Other | Admitting: *Deleted

## 2012-04-30 DIAGNOSIS — I4891 Unspecified atrial fibrillation: Secondary | ICD-10-CM

## 2012-04-30 DIAGNOSIS — Z7901 Long term (current) use of anticoagulants: Secondary | ICD-10-CM

## 2012-05-06 ENCOUNTER — Other Ambulatory Visit: Payer: Self-pay | Admitting: Cardiology

## 2012-05-10 ENCOUNTER — Other Ambulatory Visit: Payer: Self-pay | Admitting: Cardiology

## 2012-05-11 NOTE — Telephone Encounter (Signed)
Spoke to pharmacist at [facility] was told ok to refill allopurinol since patient is out and it is the weekend.Advised to call PCP for future refills.

## 2012-06-11 ENCOUNTER — Ambulatory Visit (INDEPENDENT_AMBULATORY_CARE_PROVIDER_SITE_OTHER): Payer: Medicare Other

## 2012-06-11 DIAGNOSIS — I4891 Unspecified atrial fibrillation: Secondary | ICD-10-CM

## 2012-06-11 DIAGNOSIS — Z7901 Long term (current) use of anticoagulants: Secondary | ICD-10-CM

## 2012-07-16 ENCOUNTER — Other Ambulatory Visit: Payer: Self-pay | Admitting: *Deleted

## 2012-07-16 ENCOUNTER — Ambulatory Visit (INDEPENDENT_AMBULATORY_CARE_PROVIDER_SITE_OTHER): Payer: Medicare Other | Admitting: *Deleted

## 2012-07-16 DIAGNOSIS — I4891 Unspecified atrial fibrillation: Secondary | ICD-10-CM

## 2012-07-16 DIAGNOSIS — Z7901 Long term (current) use of anticoagulants: Secondary | ICD-10-CM

## 2012-07-16 MED ORDER — WARFARIN SODIUM 5 MG PO TABS: 5.0000 mg | ORAL_TABLET | ORAL | Status: DC

## 2012-07-18 ENCOUNTER — Other Ambulatory Visit: Payer: Self-pay

## 2012-07-18 MED ORDER — CLONIDINE HCL 0.1 MG PO TABS: 0.1000 mg | ORAL_TABLET | ORAL | Status: DC

## 2012-07-25 ENCOUNTER — Ambulatory Visit (INDEPENDENT_AMBULATORY_CARE_PROVIDER_SITE_OTHER): Payer: Medicare Other | Admitting: Cardiology

## 2012-07-25 ENCOUNTER — Encounter: Payer: Self-pay | Admitting: Cardiology

## 2012-07-25 VITALS — BP 132/70 | HR 67 | Ht 69.0 in | Wt 217.8 lb

## 2012-07-25 DIAGNOSIS — Z7901 Long term (current) use of anticoagulants: Secondary | ICD-10-CM

## 2012-07-25 DIAGNOSIS — I2510 Atherosclerotic heart disease of native coronary artery without angina pectoris: Secondary | ICD-10-CM

## 2012-07-25 DIAGNOSIS — I10 Essential (primary) hypertension: Secondary | ICD-10-CM

## 2012-07-25 DIAGNOSIS — I4891 Unspecified atrial fibrillation: Secondary | ICD-10-CM

## 2012-07-25 NOTE — Patient Instructions (Signed)
We will schedule you for a stress myoview study.  Continue your current therapy.  I will see you in 6 months.

## 2012-07-25 NOTE — Progress Notes (Signed)
[place] Date of Birth: 1941/01/22   History of Present Illness: Mr. Crossan is seen today for a followup visit. He reports that he is doing very well. He has a history of coronary disease with prior coronary bypass surgery in 2005. Last evaluation with cardiac catheterization in July of 2012 showed a patent LIMA graft to the LAD. The second marginal branch was occluded in the first marginal branch had a moderately severe stenosis. This is treated medically. He denies any current symptoms of chest pain or shortness of breath. He is walking regularly. He does have some intermittent swelling in his right leg. 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 He denies any palpitations.  Current Outpatient Prescriptions on File Prior to Visit  Medication Sig Dispense Refill  . acetaminophen (TYLENOL) 500 MG tablet Take 500 mg by mouth every 6 (six) hours as needed.        Marland Kitchen allopurinol (ZYLOPRIM) 300 MG tablet TAKE 1 TABLET (300 MG TOTAL) BY MOUTH DAILY.  90 tablet  0  . amLODipine-atorvastatin (CADUET) 10-40 MG per tablet TAKE 1 TABLET DAILY  90 tablet  1  . Ascorbic Acid (VITAMIN C) 500 MG tablet Take 1,000 mg by mouth daily.       Marland Kitchen aspirin 81 MG tablet Take 81 mg by mouth daily.        Marland Kitchen b complex vitamins tablet Take 1 tablet by mouth daily.        . cloNIDine (CATAPRES) 0.1 MG tablet Take 1 tablet (0.1 mg total) by mouth as directed.  270 tablet  3  . fish oil-omega-3 fatty acids 1000 MG capsule Take 1 g by mouth 2 (two) times daily.        . furosemide (LASIX) 20 MG tablet Take 1 tablet (20 mg total) by mouth daily.  90 tablet  2  . isosorbide mononitrate (IMDUR) 30 MG 24 hr tablet TAKE 1 TABLET DAILY  90 tablet  3  . Lancets (ONETOUCH ULTRASOFT) lancets       . lisinopril (PRINIVIL,ZESTRIL) 40 MG tablet TAKE 1 TABLET DAILY  90 tablet  2  . metFORMIN (GLUCOPHAGE-XR) 500 MG 24 hr tablet Take 1 tablet (500 mg total) by mouth 2 (two) times daily.  180 tablet  3    . metoprolol succinate (TOPROL-XL) 100 MG 24 hr tablet TAKE 1 TABLET DAILY  90 tablet  2  . multivitamin (THERAGRAN) per tablet Take 1 tablet by mouth daily.        Marland Kitchen NIASPAN 500 MG CR tablet TAKE 1 TABLET AT BEDTIME  90 tablet  1  . nitroGLYCERIN (NITROSTAT) 0.4 MG SL tablet Place 1 tablet (0.4 mg total) under the tongue every 5 (five) minutes as needed for chest pain.  100 tablet  3  . ONE TOUCH ULTRA TEST test strip       . warfarin (COUMADIN) 5 MG tablet Take 1 tablet (5 mg total) by mouth as directed.  100 tablet  1    Allergies  Allergen Reactions  . Cephalexin     Past Medical History  Diagnosis Date  . Coronary artery disease   . Hypertension   . Diabetes mellitus   . Paroxysmal atrial fibrillation   . Current use of long term anticoagulation   . Dyslipidemia   . Edema   . Hyperlipidemia   . Gout   . History of acute myocardial infarction 11/2005    acute anteroseptal myocardial infarction  . Osteoarthritis   .  Chest pain July 2012    s/p repeat cath; stable anatomy. Managed medically    Past Surgical History  Procedure Date  . Coronary artery bypass graft 2005  . Cardiac catheterization 05/18/2009  . Cardiac catheterization July 2012    Stable anatomy. Manage medically    History  Smoking status  . Former Smoker  . Types: Cigarettes  . Quit date: 09/05/1982  Smokeless tobacco  . Never Used    History  Alcohol Use No    Family History  Problem Relation Age of Onset  . Coronary artery disease Mother   . Heart disease Mother   . Coronary artery disease Father   . Heart disease Father   . Cancer Brother   . Cancer Brother     Review of Systems: The review of systems is as above.  All other systems were reviewed and are negative.  Physical Exam: BP 132/70  Pulse 67  Ht 5\' 9"  (1.753 m)  Wt 217 lb 12.8 oz (98.793 kg)  BMI 32.16 kg/m2 Patient is very pleasant and in no acute distress. Skin is warm and dry. Color is normal.  HEENT is  unremarkable. Normocephalic/atraumatic. PERRL. Sclera are nonicteric. Neck is supple. No masses. No JVD. Lungs are clear. Cardiac exam shows a regular rate and rhythm. Abdomen is soft. Extremities reveal 1+ right lower extremity edema.  Gait and ROM are intact. No gross neurologic deficits noted.  LABORATORY DATA: ECG today is normal.  Assessment / Plan:  1. Coronary disease status post CABG. Vein graft to the OM is known to be occluded. He is asymptomatic on medical therapy. We will arrange a stress Myoview study for DOT clearance. He will continue with his medical therapy including metoprolol, isosorbide, and amlodipine.  2. Atrial fibrillation. He is on chronic Coumadin therapy. He remains in sinus rhythm at this time. We'll continue with rate control.  3. Hyperlipidemia. Lipid panel in July showed a total cholesterol 135, triglycerides 130, HDL 41, and LDL 68. He will continue with his statin therapy. Continue niacin.

## 2012-07-30 ENCOUNTER — Ambulatory Visit (HOSPITAL_COMMUNITY): Payer: Medicare Other | Attending: Cardiovascular Disease | Admitting: Radiology

## 2012-07-30 VITALS — BP 137/62 | Ht 69.0 in | Wt 211.0 lb

## 2012-07-30 DIAGNOSIS — I4891 Unspecified atrial fibrillation: Secondary | ICD-10-CM

## 2012-07-30 DIAGNOSIS — I2510 Atherosclerotic heart disease of native coronary artery without angina pectoris: Secondary | ICD-10-CM | POA: Insufficient documentation

## 2012-07-30 DIAGNOSIS — Z7901 Long term (current) use of anticoagulants: Secondary | ICD-10-CM

## 2012-07-30 DIAGNOSIS — I10 Essential (primary) hypertension: Secondary | ICD-10-CM | POA: Insufficient documentation

## 2012-07-30 MED ORDER — TECHNETIUM TC 99M SESTAMIBI GENERIC - CARDIOLITE
30.0000 | Freq: Once | INTRAVENOUS | Status: AC | PRN
  Administered 2012-07-30: 30 via INTRAVENOUS

## 2012-07-30 MED ORDER — TECHNETIUM TC 99M SESTAMIBI GENERIC - CARDIOLITE
10.0000 | Freq: Once | INTRAVENOUS | Status: AC | PRN
  Administered 2012-07-30: 10 via INTRAVENOUS

## 2012-07-30 NOTE — Progress Notes (Signed)
[facility] SITE 3 NUCLEAR MED [id number] [place] [address] [phone]  Cardiology Nuclear Med Study  [name] is a 71 y.o. male     MRN : [id number]     DOB: Mar 31, 1941  Procedure Date: 07/30/2012  Nuclear Med Background Indication for Stress Test:  Evaluation for Ischemia and Graft Patency History:  '05 CABG, AFIB, '07 MI, '10 MPS, '12 Heart Cath: Vein graft to D-OCC EF: 65% Cardiac Risk Factors: Family History - CAD, History of Smoking, Hypertension, Lipids and NIDDM  Symptoms:  DOE, Palpitations   Nuclear Pre-Procedure Caffeine/Decaff Intake:  None NPO After: 10:00pm   Lungs:  clear O2 Sat: 95% on room air. IV 0.9% NS with Angio Cath:  22g  IV Site: R Hand  IV Started by:  Cathlyn Parsons, RN  Chest Size (in):  46 Cup Size: n/a  Height: 5\' 9"  (1.753 m)  Weight:  211 lb (95.709 kg)  BMI:  Body mass index is 31.16 kg/(m^2). Tech Comments:  Toprol held x 24 hrs DOD P.Jordan consulted and stated he would review the report after it was read. Patient sent home.    Nuclear Med Study 1 or 2 day study: 1 day  Stress Test Type:  Stress  Reading MD: Charlton Haws, MD  Order Authorizing Provider:  Vonna Drafts  Resting Radionuclide: Technetium 61m Sestamibi  Resting Radionuclide Dose: 9.6 mCi   Stress Radionuclide:  Technetium 63m Sestamibi  Stress Radionuclide Dose: 29.7 mCi           Stress Protocol Rest HR: 67 Stress HR: 134  Rest BP: 137/62 Stress BP: 200/88  Exercise Time (min): 4:00 METS: 5.80   Predicted Max HR: 150 bpm % Max HR: 89.33 bpm Rate Pressure Product: 62952   Dose of Adenosine (mg):  n/a Dose of Lexiscan: n/a mg  Dose of Atropine (mg): n/a Dose of Dobutamine: n/a mcg/kg/min (at max HR)  Stress Test Technologist: Milana Na, EMT-P  Nuclear Technologist:  Domenic Polite, CNMT     Rest Procedure:  Myocardial perfusion imaging was performed at rest 45 minutes following the intravenous  administration of Technetium 59m Sestamibi. Rest ECG: NSR - Normal EKG  Stress Procedure:  The patient performed treadmill exercise using a Bruce  Protocol for 4:00 minutes. The patient stopped due to sob, fatigue, and denied any chest pain.  There were  mildST-T wave changes and rare pacs/pvcs.  Technetium 4m Sestamibi was injected at peak exercise and myocardial perfusion imaging was performed after a brief delay. Stress ECG:  There was no significant change with stress.  QPS Raw Data Images:  Patient motion noted; appropriate software correction applied. Stress Images:  There is decreased activity in a small area affecting the entire apical cap. The degree of photon reduction is moderate Rest Images:  There is decreased activity in a small area affecting the entire apical cap. The degree of photon reduction is mild Subtraction (SDS):  There is slight reversibility in the area of the apical cap. This raises the question of slight ischemia in this area. Transient Ischemic Dilatation (Normal <1.22):  1.06 Lung/Heart Ratio (Normal <0.45):  0.43  Quantitative Gated Spect Images QGS EDV:  83 ml QGS ESV:  26 ml  Impression Exercise Capacity:  There is limited exercise opacity. The patient walked 4 minutes. There was a significant hypertensive response and shortness of breath. BP Response:  Hypertensive blood pressure response. Clinical Symptoms:  shortness of breath ECG Impression:  No significant ST segment  change suggestive of ischemia. Comparison with Prior Nuclear Study: No images to compare  Overall Impression:  There is an area affecting the apical cap that shows possible prior scar with some peri-infarct ischemia. This is a small area. There are no other significant abnormalities. Wall motion analysis is listed in the sentence below. This is a LOW RISK scan.  LV Ejection Fraction: 69%.  LV Wall Motion:    There is mild septal dyssynergy that may be related to prior CABG. There is mild  hypokinesis at the apex.Tinnie Gens Katz,MD

## 2012-07-31 ENCOUNTER — Other Ambulatory Visit: Payer: Self-pay

## 2012-07-31 NOTE — Telephone Encounter (Signed)
According to 05/10/2012 telephone note this should be sent to PCP

## 2012-08-06 ENCOUNTER — Telehealth: Payer: Self-pay

## 2012-08-06 NOTE — Telephone Encounter (Signed)
Called [facility] on [address] and spoke to pharmacist and told her to send refill request for Allopurinol to PCP

## 2012-08-20 ENCOUNTER — Ambulatory Visit (INDEPENDENT_AMBULATORY_CARE_PROVIDER_SITE_OTHER): Payer: Medicare Other | Admitting: Pharmacist

## 2012-08-20 DIAGNOSIS — Z7901 Long term (current) use of anticoagulants: Secondary | ICD-10-CM

## 2012-08-20 DIAGNOSIS — I4891 Unspecified atrial fibrillation: Secondary | ICD-10-CM

## 2012-08-20 LAB — POCT INR: INR: 3.2

## 2012-08-31 ENCOUNTER — Encounter: Payer: Self-pay | Admitting: Cardiology

## 2012-09-07 ENCOUNTER — Other Ambulatory Visit: Payer: Self-pay

## 2012-09-07 MED ORDER — WARFARIN SODIUM 5 MG PO TABS: 5.0000 mg | ORAL_TABLET | ORAL | Status: DC

## 2012-09-17 ENCOUNTER — Ambulatory Visit (INDEPENDENT_AMBULATORY_CARE_PROVIDER_SITE_OTHER): Payer: Medicare Other | Admitting: *Deleted

## 2012-09-17 DIAGNOSIS — I4891 Unspecified atrial fibrillation: Secondary | ICD-10-CM

## 2012-09-17 DIAGNOSIS — Z7901 Long term (current) use of anticoagulants: Secondary | ICD-10-CM

## 2012-10-05 ENCOUNTER — Telehealth: Payer: Self-pay | Admitting: Cardiology

## 2012-10-05 ENCOUNTER — Telehealth: Payer: Self-pay | Admitting: *Deleted

## 2012-10-05 MED ORDER — AMLODIPINE-ATORVASTATIN 10-40 MG PO TABS: 1.0000 | ORAL_TABLET | Freq: Every day | ORAL | Status: DC

## 2012-10-05 MED ORDER — NIACIN ER (ANTIHYPERLIPIDEMIC) 500 MG PO TBCR: 500.0000 mg | EXTENDED_RELEASE_TABLET | Freq: Every day | ORAL | Status: DC

## 2012-10-05 MED ORDER — LISINOPRIL 40 MG PO TABS: 40.0000 mg | ORAL_TABLET | Freq: Every day | ORAL | Status: DC

## 2012-10-05 NOTE — Telephone Encounter (Signed)
Refilled as requested  

## 2012-10-05 NOTE — Telephone Encounter (Signed)
Walk In pt Form " [employer]" Pt Needs ASAP Sent to Message Nurse 10/05/12/KM

## 2012-10-15 ENCOUNTER — Ambulatory Visit (INDEPENDENT_AMBULATORY_CARE_PROVIDER_SITE_OTHER): Payer: Medicare Other | Admitting: *Deleted

## 2012-10-15 DIAGNOSIS — Z7901 Long term (current) use of anticoagulants: Secondary | ICD-10-CM

## 2012-10-15 DIAGNOSIS — I4891 Unspecified atrial fibrillation: Secondary | ICD-10-CM

## 2012-10-15 LAB — POCT INR: INR: 2

## 2012-11-12 ENCOUNTER — Ambulatory Visit (INDEPENDENT_AMBULATORY_CARE_PROVIDER_SITE_OTHER): Payer: Medicare Other

## 2012-11-12 DIAGNOSIS — Z7901 Long term (current) use of anticoagulants: Secondary | ICD-10-CM

## 2012-11-12 DIAGNOSIS — I4891 Unspecified atrial fibrillation: Secondary | ICD-10-CM

## 2012-11-23 ENCOUNTER — Other Ambulatory Visit: Payer: Self-pay | Admitting: Cardiology

## 2012-11-26 ENCOUNTER — Telehealth: Payer: Self-pay | Admitting: Cardiology

## 2012-11-26 ENCOUNTER — Other Ambulatory Visit: Payer: Self-pay | Admitting: *Deleted

## 2012-11-26 MED ORDER — CLONIDINE HCL 0.1 MG PO TABS: ORAL_TABLET | ORAL | Status: DC

## 2012-11-26 NOTE — Telephone Encounter (Signed)
Refilled as requested  

## 2012-11-26 NOTE — Telephone Encounter (Signed)
Walk in pt Form "Refill On Clonidine" gave to Message Nurse  11/26/12/KM

## 2012-12-10 IMAGING — CR DG CHEST 1V PORT
1 series · 1 of 1 positions shown · non-contrast
Comparison: 05/16/2009

CLINICAL DATA: Chest pain.

PORTABLE CHEST - 1 VIEW

[AP]
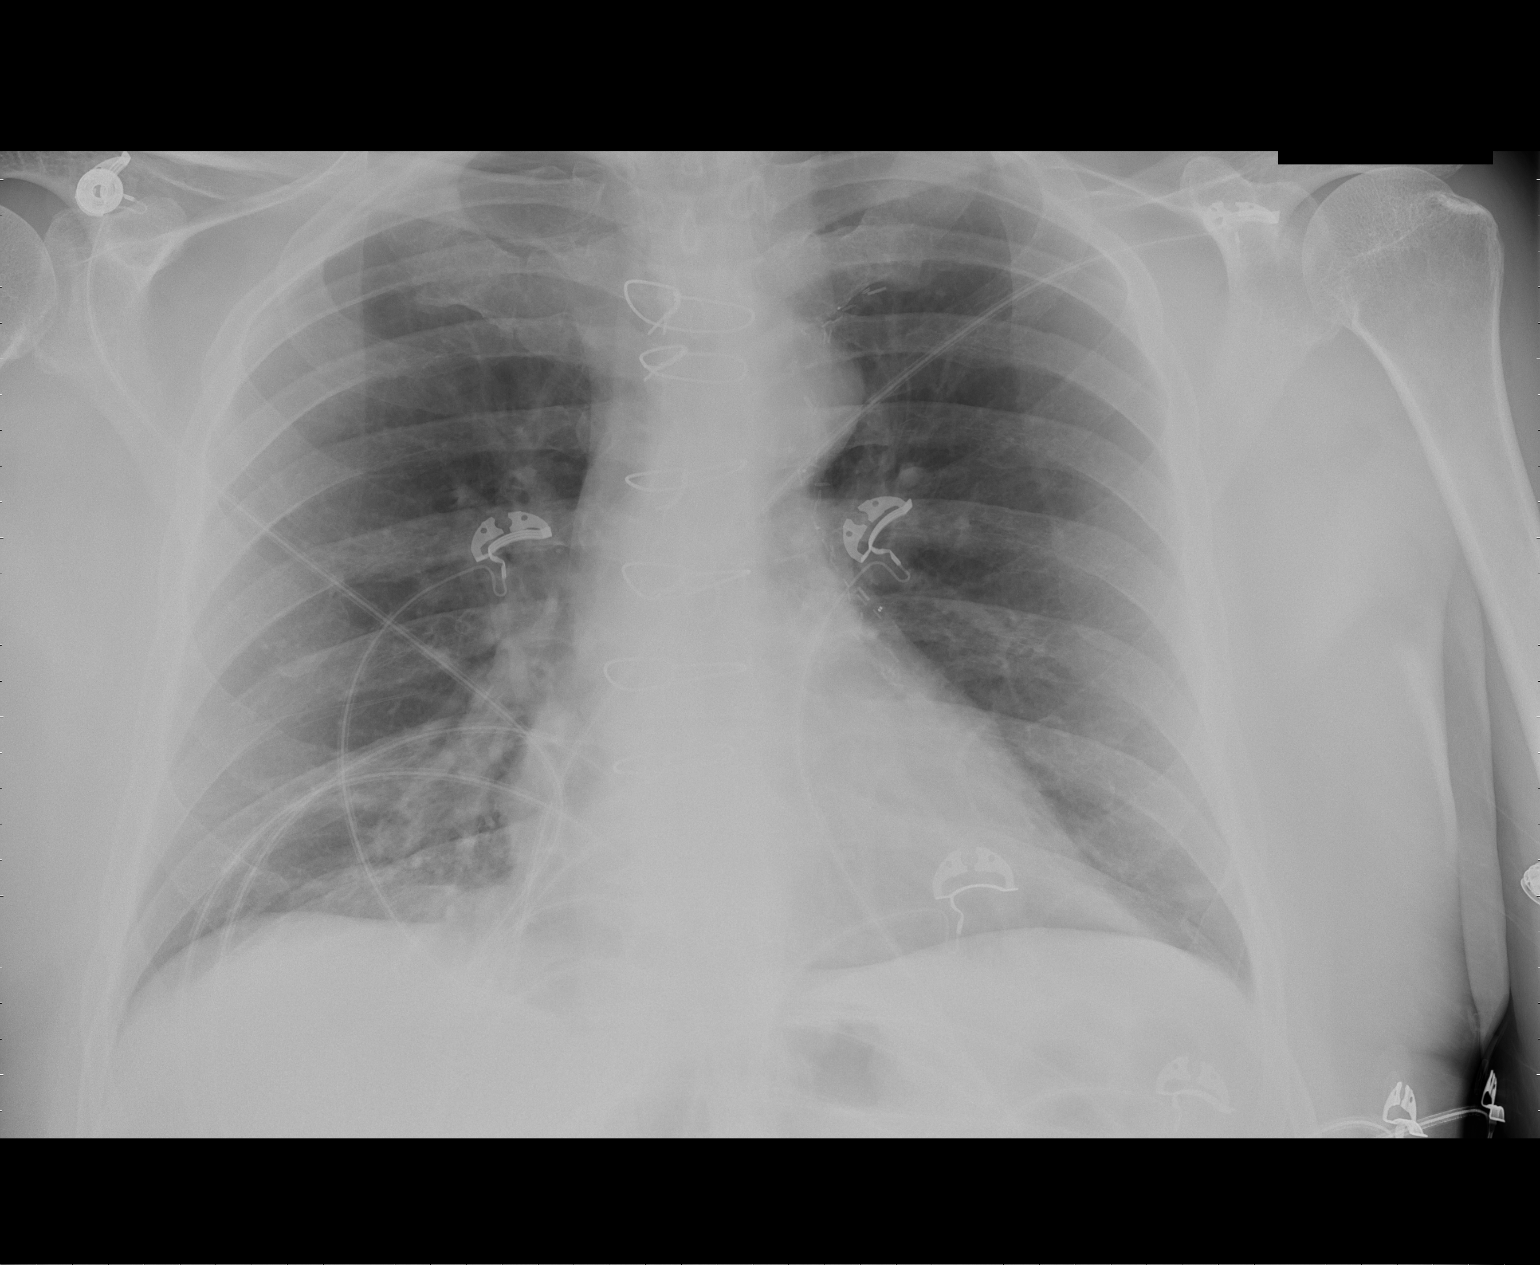

[1 of 1 positions shown; findings below may reference images not displayed]

FINDINGS: The heart size and vascularity are normal and the lungs
are clear.  Evidence of prior CABG.  No acute osseous abnormality.
IMPRESSION: No acute disease.

## 2012-12-17 ENCOUNTER — Ambulatory Visit (INDEPENDENT_AMBULATORY_CARE_PROVIDER_SITE_OTHER): Payer: Medicare Other

## 2012-12-17 DIAGNOSIS — Z7901 Long term (current) use of anticoagulants: Secondary | ICD-10-CM

## 2012-12-17 DIAGNOSIS — I4891 Unspecified atrial fibrillation: Secondary | ICD-10-CM

## 2012-12-17 LAB — POCT INR: INR: 2

## 2013-01-05 ENCOUNTER — Other Ambulatory Visit: Payer: Self-pay | Admitting: Cardiology

## 2013-01-07 ENCOUNTER — Other Ambulatory Visit: Payer: Self-pay

## 2013-01-16 ENCOUNTER — Ambulatory Visit: Payer: Medicare Other | Admitting: Cardiology

## 2013-01-29 ENCOUNTER — Encounter: Payer: Self-pay | Admitting: Cardiology

## 2013-01-29 ENCOUNTER — Ambulatory Visit (INDEPENDENT_AMBULATORY_CARE_PROVIDER_SITE_OTHER): Payer: Medicare Other | Admitting: Cardiology

## 2013-01-29 ENCOUNTER — Ambulatory Visit (INDEPENDENT_AMBULATORY_CARE_PROVIDER_SITE_OTHER): Payer: Medicare Other | Admitting: Pharmacist

## 2013-01-29 VITALS — BP 122/60 | HR 57 | Ht 69.0 in | Wt 220.3 lb

## 2013-01-29 DIAGNOSIS — I10 Essential (primary) hypertension: Secondary | ICD-10-CM

## 2013-01-29 DIAGNOSIS — E119 Type 2 diabetes mellitus without complications: Secondary | ICD-10-CM

## 2013-01-29 DIAGNOSIS — I2510 Atherosclerotic heart disease of native coronary artery without angina pectoris: Secondary | ICD-10-CM

## 2013-01-29 DIAGNOSIS — I4891 Unspecified atrial fibrillation: Secondary | ICD-10-CM

## 2013-01-29 DIAGNOSIS — E785 Hyperlipidemia, unspecified: Secondary | ICD-10-CM

## 2013-01-29 DIAGNOSIS — Z7901 Long term (current) use of anticoagulants: Secondary | ICD-10-CM

## 2013-01-29 LAB — POCT INR: INR: 1.9

## 2013-01-29 NOTE — Progress Notes (Signed)
[name] Date of Birth: [birth year]   History of Present Illness: [name] is seen today for a followup visit.  He has a history of coronary disease with prior coronary bypass surgery in 2005. Last evaluation with cardiac catheterization in July of 2012 showed a patent LIMA graft to the LAD. The second marginal branch was occluded in the first marginal branch had a moderately severe stenosis. This is treated medically. His last Myoview study in November 2013 showed a small apical fixed defect without ischemia. Ejection fraction 69%. He denies any chest pain, palpitations, or shortness of breath. He admits that he wasn't very active this winter and gained some weight. He has now resumed his regular activity and is losing weight.  Current Outpatient Prescriptions on File Prior to Visit  Medication Sig Dispense Refill  . acetaminophen (TYLENOL) 500 MG tablet Take 500 mg by mouth every 6 (six) hours as needed.        Marland Kitchen allopurinol (ZYLOPRIM) 300 MG tablet TAKE 1 TABLET (300 MG TOTAL) BY MOUTH DAILY.  90 tablet  0  . amLODipine-atorvastatin (CADUET) 10-40 MG per tablet Take 1 tablet by mouth daily.  90 tablet  3  . Ascorbic Acid (VITAMIN C) 500 MG tablet Take 1,000 mg by mouth daily.       Marland Kitchen aspirin 81 MG tablet Take 81 mg by mouth daily.        Marland Kitchen b complex vitamins tablet Take 1 tablet by mouth daily.        . cloNIDine (CATAPRES) 0.1 MG tablet Take 1 tablet in the morning and 2 tablets in the evening  270 tablet  3  . fish oil-omega-3 fatty acids 1000 MG capsule Take 1 g by mouth 2 (two) times daily.        . furosemide (LASIX) 20 MG tablet TAKE 1 TABLET DAILY  90 tablet  1  . isosorbide mononitrate (IMDUR) 30 MG 24 hr tablet TAKE 1 TABLET DAILY  90 tablet  3  . Lancets (ONETOUCH ULTRASOFT) lancets       . lisinopril (PRINIVIL,ZESTRIL) 40 MG tablet Take 1 tablet (40 mg total) by mouth daily.  90 tablet  3  . metFORMIN (GLUCOPHAGE-XR) 500 MG 24 hr tablet Take 1 tablet (500 mg total)  by mouth 2 (two) times daily.  180 tablet  3  . metoprolol succinate (TOPROL-XL) 100 MG 24 hr tablet TAKE 1 TABLET DAILY  90 tablet  1  . multivitamin (THERAGRAN) per tablet Take 1 tablet by mouth daily.        . niacin (NIASPAN) 500 MG CR tablet Take 1 tablet (500 mg total) by mouth at bedtime.  90 tablet  3  . nitroGLYCERIN (NITROSTAT) 0.4 MG SL tablet Place 1 tablet (0.4 mg total) under the tongue every 5 (five) minutes as needed for chest pain.  100 tablet  3  . ONE TOUCH ULTRA TEST test strip       . warfarin (COUMADIN) 5 MG tablet Take 1 tablet (5 mg total) by mouth as directed.  100 tablet  1   No current facility-administered medications on file prior to visit.    Allergies  Allergen Reactions  . Cephalexin     Past Medical History  Diagnosis Date  . Coronary artery disease   . Hypertension   . Diabetes mellitus   . Paroxysmal atrial fibrillation   . Current use of long term anticoagulation   . Dyslipidemia   . Edema   .  Hyperlipidemia   . Gout   . History of acute myocardial infarction 11/2005    acute anteroseptal myocardial infarction  . Osteoarthritis   . Chest pain July 2012    s/p repeat cath; stable anatomy. Managed medically    Past Surgical History  Procedure Laterality Date  . Coronary artery bypass graft  2005  . Cardiac catheterization  05/18/2009  . Cardiac catheterization  July 2012    Stable anatomy. Manage medically    History  Smoking status  . Former Smoker  . Types: Cigarettes  . Quit date: 09/05/1982  Smokeless tobacco  . Never Used    History  Alcohol Use No    Family History  Problem Relation Age of Onset  . Coronary artery disease Mother   . Heart disease Mother   . Coronary artery disease Father   . Heart disease Father   . Cancer Brother   . Cancer Brother     Review of Systems: The review of systems is as above.  All other systems were reviewed and are negative.  Physical Exam: BP 122/60  Pulse 57  Ht 5\' 9"  (1.753  m)  Wt 220 lb 4.8 oz (99.927 kg)  BMI 32.52 kg/m2  SpO2 93% Patient is very pleasant and in no acute distress. Skin is warm and dry. Color is normal.  HEENT is unremarkable. Normocephalic/atraumatic. PERRL. Sclera are nonicteric. Neck is supple. No masses. No JVD. Lungs are clear. Cardiac exam shows a regular rate and rhythm. Abdomen is soft. Extremities reveal trace right lower extremity edema.  Gait and ROM are intact. No gross neurologic deficits noted.  LABORATORY DATA: INR today is 1.9  Assessment / Plan:  1. Coronary disease status post CABG. Vein graft to the OM is known to be occluded. He is asymptomatic on medical therapy. Myoview study in November showed no significant ischemia. He will continue with his medical therapy including metoprolol, isosorbide, and amlodipine.  2. Atrial fibrillation. He is on chronic Coumadin therapy. He remains in sinus rhythm at this time. We'll continue with rate control.  3. Hyperlipidemia. On statin and niacin. He is scheduled for complete physical with his primary care in July and will have fasting lab work done at that time.

## 2013-01-29 NOTE — Patient Instructions (Addendum)
Keep your weight under control. Get plenty of  Exercise.  I will see you in 6 months.

## 2013-02-27 ENCOUNTER — Other Ambulatory Visit: Payer: Self-pay | Admitting: Cardiology

## 2013-03-04 ENCOUNTER — Ambulatory Visit (INDEPENDENT_AMBULATORY_CARE_PROVIDER_SITE_OTHER): Payer: Medicare Other | Admitting: *Deleted

## 2013-03-04 DIAGNOSIS — I4891 Unspecified atrial fibrillation: Secondary | ICD-10-CM

## 2013-03-04 DIAGNOSIS — Z7901 Long term (current) use of anticoagulants: Secondary | ICD-10-CM

## 2013-03-13 ENCOUNTER — Other Ambulatory Visit: Payer: Self-pay | Admitting: Cardiology

## 2013-03-13 ENCOUNTER — Telehealth: Payer: Self-pay

## 2013-03-13 NOTE — Telephone Encounter (Signed)
Received a fax request from Dr.Hung's office.Dr.Jordan advised ok to hold coumadin for 5 days prior to colonoscopy. Note faxed to fax # [phone].

## 2013-03-26 ENCOUNTER — Telehealth: Payer: Self-pay | Admitting: Cardiology

## 2013-03-26 NOTE — Telephone Encounter (Signed)
Returned call to patient Dr.Jordan advised ok to hold coumadin 5 days prior to colonoscopy.Will call and let Dr.Hung's office know tomorrow 03/27/13.

## 2013-03-26 NOTE — Telephone Encounter (Signed)
New problem   Pt has question about when to go off coumadin before his colonoscopy

## 2013-03-28 NOTE — Telephone Encounter (Signed)
Dr.Hung's nurse called left message on personal voice mail Dr.Jordan advised ok for patient to hold coumadin 5 days prior to colonoscopy.

## 2013-04-18 ENCOUNTER — Ambulatory Visit (INDEPENDENT_AMBULATORY_CARE_PROVIDER_SITE_OTHER): Payer: Medicare Other | Admitting: *Deleted

## 2013-04-18 DIAGNOSIS — Z7901 Long term (current) use of anticoagulants: Secondary | ICD-10-CM

## 2013-04-18 DIAGNOSIS — I4891 Unspecified atrial fibrillation: Secondary | ICD-10-CM

## 2013-04-29 ENCOUNTER — Ambulatory Visit (INDEPENDENT_AMBULATORY_CARE_PROVIDER_SITE_OTHER): Payer: Medicare Other | Admitting: *Deleted

## 2013-04-29 DIAGNOSIS — Z7901 Long term (current) use of anticoagulants: Secondary | ICD-10-CM

## 2013-04-29 DIAGNOSIS — I4891 Unspecified atrial fibrillation: Secondary | ICD-10-CM

## 2013-04-29 LAB — POCT INR: INR: 1.9

## 2013-05-20 ENCOUNTER — Ambulatory Visit (INDEPENDENT_AMBULATORY_CARE_PROVIDER_SITE_OTHER): Payer: Medicare Other

## 2013-05-20 DIAGNOSIS — I4891 Unspecified atrial fibrillation: Secondary | ICD-10-CM

## 2013-05-20 DIAGNOSIS — Z7901 Long term (current) use of anticoagulants: Secondary | ICD-10-CM

## 2013-06-24 ENCOUNTER — Ambulatory Visit (INDEPENDENT_AMBULATORY_CARE_PROVIDER_SITE_OTHER): Payer: Medicare Other | Admitting: *Deleted

## 2013-06-24 DIAGNOSIS — Z7901 Long term (current) use of anticoagulants: Secondary | ICD-10-CM

## 2013-06-24 DIAGNOSIS — I4891 Unspecified atrial fibrillation: Secondary | ICD-10-CM

## 2013-07-04 ENCOUNTER — Ambulatory Visit: Payer: Medicare Other | Admitting: Cardiology

## 2013-07-22 ENCOUNTER — Ambulatory Visit (INDEPENDENT_AMBULATORY_CARE_PROVIDER_SITE_OTHER): Payer: Medicare Other | Admitting: *Deleted

## 2013-07-22 DIAGNOSIS — I4891 Unspecified atrial fibrillation: Secondary | ICD-10-CM

## 2013-07-22 DIAGNOSIS — Z7901 Long term (current) use of anticoagulants: Secondary | ICD-10-CM

## 2013-07-22 LAB — POCT INR: INR: 2

## 2013-07-29 ENCOUNTER — Telehealth: Payer: Self-pay

## 2013-07-29 NOTE — Telephone Encounter (Signed)
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

## 2013-08-19 ENCOUNTER — Ambulatory Visit (INDEPENDENT_AMBULATORY_CARE_PROVIDER_SITE_OTHER): Payer: Medicare Other | Admitting: *Deleted

## 2013-08-19 DIAGNOSIS — I4891 Unspecified atrial fibrillation: Secondary | ICD-10-CM

## 2013-08-19 DIAGNOSIS — Z7901 Long term (current) use of anticoagulants: Secondary | ICD-10-CM

## 2013-08-19 LAB — POCT INR: INR: 2

## 2013-08-22 DIAGNOSIS — E1169 Type 2 diabetes mellitus with other specified complication: Secondary | ICD-10-CM | POA: Insufficient documentation

## 2013-09-09 ENCOUNTER — Ambulatory Visit: Payer: Medicare Other | Admitting: Cardiology

## 2013-09-16 ENCOUNTER — Encounter: Payer: Self-pay | Admitting: Cardiology

## 2013-09-16 ENCOUNTER — Ambulatory Visit (INDEPENDENT_AMBULATORY_CARE_PROVIDER_SITE_OTHER): Payer: Medicare Other | Admitting: Pharmacist

## 2013-09-16 ENCOUNTER — Ambulatory Visit (INDEPENDENT_AMBULATORY_CARE_PROVIDER_SITE_OTHER): Payer: Medicare Other | Admitting: Cardiology

## 2013-09-16 VITALS — BP 164/70 | HR 65 | Ht 69.0 in | Wt 223.0 lb

## 2013-09-16 DIAGNOSIS — E785 Hyperlipidemia, unspecified: Secondary | ICD-10-CM

## 2013-09-16 DIAGNOSIS — Z7901 Long term (current) use of anticoagulants: Secondary | ICD-10-CM

## 2013-09-16 DIAGNOSIS — I4891 Unspecified atrial fibrillation: Secondary | ICD-10-CM

## 2013-09-16 DIAGNOSIS — I2510 Atherosclerotic heart disease of native coronary artery without angina pectoris: Secondary | ICD-10-CM

## 2013-09-16 LAB — POCT INR: INR: 1.9

## 2013-09-16 NOTE — Patient Instructions (Signed)
Stop Niaspan   Continue your other therapy  Try and get more exercise and lose weight.

## 2013-09-16 NOTE — Progress Notes (Signed)
[name] Date of Birth: August 10, 1941   History of Present Illness: [name] is seen today for a followup visit.  He has a history of coronary disease with prior coronary bypass surgery in 2005. Last evaluation with cardiac catheterization in July of 2012 showed a patent LIMA graft to the LAD. The second marginal branch was occluded in the first marginal branch had a moderately severe stenosis. This is treated medically. His last Myoview study in November 2013 showed a small apical fixed defect without ischemia. Ejection fraction 69%. He denies any chest pain, palpitations, or shortness of breath He did gain a little weight over the holidays.  Current Outpatient Prescriptions on File Prior to Visit  Medication Sig Dispense Refill  . acetaminophen (TYLENOL) 500 MG tablet Take 500 mg by mouth every 6 (six) hours as needed.       Marland Kitchen allopurinol (ZYLOPRIM) 300 MG tablet TAKE 1 TABLET (300 MG TOTAL) BY MOUTH DAILY.  90 tablet  0  . amLODipine-atorvastatin (CADUET) 10-40 MG per tablet Take 1 tablet by mouth daily.  90 tablet  3  . Ascorbic Acid (VITAMIN C) 500 MG tablet Take 1,000 mg by mouth daily.       Marland Kitchen aspirin 81 MG tablet Take 81 mg by mouth daily.        Marland Kitchen b complex vitamins tablet Take 1 tablet by mouth daily.        . cloNIDine (CATAPRES) 0.1 MG tablet Take 1 tablet in the morning and 2 tablets in the evening  270 tablet  3  . fish oil-omega-3 fatty acids 1000 MG capsule Take 1 g by mouth 2 (two) times daily.        . furosemide (LASIX) 20 MG tablet TAKE 1 TABLET DAILY  90 tablet  1  . isosorbide mononitrate (IMDUR) 30 MG 24 hr tablet TAKE 1 TABLET DAILY  90 tablet  2  . Lancets (ONETOUCH ULTRASOFT) lancets       . lisinopril (PRINIVIL,ZESTRIL) 40 MG tablet Take 1 tablet (40 mg total) by mouth daily.  90 tablet  3  . metFORMIN (GLUCOPHAGE-XR) 500 MG 24 hr tablet Take 1 tablet (500 mg total) by mouth 2 (two) times daily.  180 tablet  3  . metoprolol succinate (TOPROL-XL) 100 MG 24  hr tablet TAKE 1 TABLET DAILY  90 tablet  1  . multivitamin (THERAGRAN) per tablet Take 1 tablet by mouth daily.        . niacin (NIASPAN) 500 MG CR tablet Take 1 tablet (500 mg total) by mouth at bedtime.  90 tablet  3  . ONE TOUCH ULTRA TEST test strip       . warfarin (COUMADIN) 5 MG tablet TAKE 1 TABLET AS DIRECTED BY COUMADIN CLINIC  100 tablet  1  . nitroGLYCERIN (NITROSTAT) 0.4 MG SL tablet Place 1 tablet (0.4 mg total) under the tongue every 5 (five) minutes as needed for chest pain.  100 tablet  3   No current facility-administered medications on file prior to visit.    Allergies  Allergen Reactions  . Cephalexin     Past Medical History  Diagnosis Date  . Coronary artery disease   . Hypertension   . Diabetes mellitus   . Paroxysmal atrial fibrillation   . Current use of long term anticoagulation   . Dyslipidemia   . Edema   . Hyperlipidemia   . Gout   . History of acute myocardial infarction 11/2005    acute  anteroseptal myocardial infarction  . Osteoarthritis   . Chest pain July 2012    s/p repeat cath; stable anatomy. Managed medically    Past Surgical History  Procedure Laterality Date  . Coronary artery bypass graft  2005  . Cardiac catheterization  05/18/2009  . Cardiac catheterization  July 2012    Stable anatomy. Manage medically    History  Smoking status  . Former Smoker  . Types: Cigarettes  . Quit date: 09/05/1982  Smokeless tobacco  . Never Used    History  Alcohol Use No    Family History  Problem Relation Age of Onset  . Coronary artery disease Mother   . Heart disease Mother   . Coronary artery disease Father   . Heart disease Father   . Cancer Brother   . Cancer Brother     Review of Systems: The review of systems is as above.  All other systems were reviewed and are negative.  Physical Exam: BP 164/70  Pulse 65  Ht 5\' 9"  (1.753 m)  Wt 223 lb (101.152 kg)  BMI 32.92 kg/m2 Patient is very pleasant and in no acute  distress. Skin is warm and dry. Color is normal.  HEENT is unremarkable. Normocephalic/atraumatic. PERRL. Sclera are nonicteric. Neck is supple. No masses. No JVD. Lungs are clear. Cardiac exam shows a regular rate and rhythm. Abdomen is soft. Extremities reveal no edema.  Gait and ROM are intact. No gross neurologic deficits noted.  LABORATORY DATA: INR today is pending.  Ecg: NSR, mild nonspecific ST abnormalty  Assessment / Plan:  1. Coronary disease status post CABG. Vein graft to the OM is known to be occluded. He is asymptomatic on medical therapy. Myoview study in November showed no significant ischemia. He will continue with his medical therapy including metoprolol, isosorbide, and amlodipine.  2. Atrial fibrillation. He is on chronic Coumadin therapy. He remains in sinus rhythm at this time. We'll continue with rate control. Check INR today.  3. Hyperlipidemia. On atorvastatin and niacin. We discussed results of recent trial showing no outcome benefit to taking niacin with a statin. Will stop niaspan and continue atorvastatin only.  All meds refilled today. I will follow up in 6 months.

## 2013-09-19 ENCOUNTER — Ambulatory Visit: Payer: Medicare Other | Admitting: Cardiology

## 2013-09-23 ENCOUNTER — Other Ambulatory Visit: Payer: Self-pay

## 2013-09-23 MED ORDER — ALLOPURINOL 300 MG PO TABS: ORAL_TABLET | ORAL | Status: DC

## 2013-09-23 MED ORDER — ISOSORBIDE MONONITRATE ER 30 MG PO TB24: ORAL_TABLET | ORAL | Status: DC

## 2013-09-23 MED ORDER — AMLODIPINE-ATORVASTATIN 10-40 MG PO TABS: 1.0000 | ORAL_TABLET | Freq: Every day | ORAL | Status: DC

## 2013-09-23 MED ORDER — LISINOPRIL 40 MG PO TABS: 40.0000 mg | ORAL_TABLET | Freq: Every day | ORAL | Status: DC

## 2013-09-23 MED ORDER — METOPROLOL SUCCINATE ER 100 MG PO TB24: ORAL_TABLET | ORAL | Status: DC

## 2013-09-23 MED ORDER — FUROSEMIDE 20 MG PO TABS: ORAL_TABLET | ORAL | Status: DC

## 2013-09-23 MED ORDER — CLONIDINE HCL 0.1 MG PO TABS: ORAL_TABLET | ORAL | Status: DC

## 2013-09-26 ENCOUNTER — Other Ambulatory Visit: Payer: Self-pay

## 2013-09-26 MED ORDER — CLONIDINE HCL 0.1 MG PO TABS: ORAL_TABLET | ORAL | Status: DC

## 2013-09-26 MED ORDER — ALLOPURINOL 300 MG PO TABS: ORAL_TABLET | ORAL | Status: DC

## 2013-09-26 MED ORDER — ISOSORBIDE MONONITRATE ER 30 MG PO TB24: ORAL_TABLET | ORAL | Status: DC

## 2013-09-26 MED ORDER — LISINOPRIL 40 MG PO TABS
40.0000 mg | ORAL_TABLET | Freq: Every day | ORAL | Status: DC
Start: 2013-09-26 — End: 2014-05-05

## 2013-09-26 MED ORDER — NIACIN ER (ANTIHYPERLIPIDEMIC) 500 MG PO TBCR: 500.0000 mg | EXTENDED_RELEASE_TABLET | Freq: Every day | ORAL | Status: DC

## 2013-09-26 MED ORDER — AMLODIPINE-ATORVASTATIN 10-40 MG PO TABS: 1.0000 | ORAL_TABLET | Freq: Every day | ORAL | Status: DC

## 2013-09-26 MED ORDER — FUROSEMIDE 20 MG PO TABS: ORAL_TABLET | ORAL | Status: DC

## 2013-09-26 MED ORDER — METOPROLOL SUCCINATE ER 100 MG PO TB24: ORAL_TABLET | ORAL | Status: DC

## 2013-10-14 ENCOUNTER — Ambulatory Visit (INDEPENDENT_AMBULATORY_CARE_PROVIDER_SITE_OTHER): Payer: Medicare Other | Admitting: *Deleted

## 2013-10-14 DIAGNOSIS — Z7901 Long term (current) use of anticoagulants: Secondary | ICD-10-CM

## 2013-10-14 DIAGNOSIS — I4891 Unspecified atrial fibrillation: Secondary | ICD-10-CM

## 2013-10-14 LAB — POCT INR: INR: 2.2

## 2013-10-16 ENCOUNTER — Telehealth: Payer: Self-pay

## 2013-10-16 MED ORDER — WARFARIN SODIUM 5 MG PO TABS: 5.0000 mg | ORAL_TABLET | ORAL | Status: DC

## 2013-10-16 NOTE — Telephone Encounter (Signed)
Rx done. 

## 2013-10-27 ENCOUNTER — Encounter (HOSPITAL_COMMUNITY): Payer: Self-pay | Admitting: Emergency Medicine

## 2013-10-27 ENCOUNTER — Other Ambulatory Visit: Payer: Self-pay | Admitting: Cardiology

## 2013-10-27 ENCOUNTER — Emergency Department (HOSPITAL_COMMUNITY)
Admission: EM | Admit: 2013-10-27 | Discharge: 2013-10-27 | Disposition: A | Discharge status: Home / Self Care | Payer: Medicare Other | Attending: Emergency Medicine | Admitting: Emergency Medicine

## 2013-10-27 DIAGNOSIS — Z9889 Other specified postprocedural states: Secondary | ICD-10-CM | POA: Insufficient documentation

## 2013-10-27 DIAGNOSIS — I252 Old myocardial infarction: Secondary | ICD-10-CM | POA: Insufficient documentation

## 2013-10-27 DIAGNOSIS — I2510 Atherosclerotic heart disease of native coronary artery without angina pectoris: Secondary | ICD-10-CM | POA: Insufficient documentation

## 2013-10-27 DIAGNOSIS — I10 Essential (primary) hypertension: Secondary | ICD-10-CM | POA: Insufficient documentation

## 2013-10-27 DIAGNOSIS — Z7982 Long term (current) use of aspirin: Secondary | ICD-10-CM | POA: Insufficient documentation

## 2013-10-27 DIAGNOSIS — I4891 Unspecified atrial fibrillation: Secondary | ICD-10-CM | POA: Insufficient documentation

## 2013-10-27 DIAGNOSIS — Z79899 Other long term (current) drug therapy: Secondary | ICD-10-CM | POA: Insufficient documentation

## 2013-10-27 DIAGNOSIS — Z87891 Personal history of nicotine dependence: Secondary | ICD-10-CM | POA: Insufficient documentation

## 2013-10-27 DIAGNOSIS — M1990 Unspecified osteoarthritis, unspecified site: Secondary | ICD-10-CM | POA: Insufficient documentation

## 2013-10-27 DIAGNOSIS — Z951 Presence of aortocoronary bypass graft: Secondary | ICD-10-CM | POA: Insufficient documentation

## 2013-10-27 DIAGNOSIS — E119 Type 2 diabetes mellitus without complications: Secondary | ICD-10-CM | POA: Insufficient documentation

## 2013-10-27 DIAGNOSIS — R000 Tachycardia, unspecified: Secondary | ICD-10-CM | POA: Insufficient documentation

## 2013-10-27 DIAGNOSIS — M109 Gout, unspecified: Secondary | ICD-10-CM | POA: Insufficient documentation

## 2013-10-27 DIAGNOSIS — Z8701 Personal history of pneumonia (recurrent): Secondary | ICD-10-CM | POA: Insufficient documentation

## 2013-10-27 LAB — CBC WITH DIFFERENTIAL/PLATELET
Basophils Absolute: 0 10*3/uL (ref 0.0–0.1)
Basophils Relative: 0 % (ref 0–1)
Eosinophils Absolute: 0.1 10*3/uL (ref 0.0–0.7)
Eosinophils Relative: 1 % (ref 0–5)
HCT: 45.6 % (ref 39.0–52.0)
Hemoglobin: 16 g/dL (ref 13.0–17.0)
Lymphocytes Relative: 22 % (ref 12–46)
Lymphs Abs: 2.4 10*3/uL (ref 0.7–4.0)
MCH: 32.4 pg (ref 26.0–34.0)
MCHC: 35.1 g/dL (ref 30.0–36.0)
MCV: 92.3 fL (ref 78.0–100.0)
Monocytes Absolute: 0.9 10*3/uL (ref 0.1–1.0)
Monocytes Relative: 8 % (ref 3–12)
Neutro Abs: 7.4 10*3/uL (ref 1.7–7.7)
Neutrophils Relative %: 68 % (ref 43–77)
Platelets: 240 10*3/uL (ref 150–400)
RBC: 4.94 MIL/uL (ref 4.22–5.81)
RDW: 13.2 % (ref 11.5–15.5)
WBC: 10.9 10*3/uL — ABNORMAL HIGH (ref 4.0–10.5)

## 2013-10-27 LAB — BASIC METABOLIC PANEL
BUN: 11 mg/dL (ref 6–23)
CO2: 25 mEq/L (ref 19–32)
Calcium: 9.9 mg/dL (ref 8.4–10.5)
Chloride: 102 mEq/L (ref 96–112)
Creatinine, Ser: 1.03 mg/dL (ref 0.50–1.35)
GFR calc Af Amer: 82 mL/min — ABNORMAL LOW (ref >90)
GFR calc non Af Amer: 71 mL/min — ABNORMAL LOW (ref >90)
Glucose, Bld: 170 mg/dL — ABNORMAL HIGH (ref 70–99)
Potassium: 4.3 mEq/L (ref 3.7–5.3)
Sodium: 142 mEq/L (ref 137–147)

## 2013-10-27 LAB — PROTIME-INR
INR: 1.91 — ABNORMAL HIGH (ref 0.00–1.49)
Prothrombin Time: 21.3 seconds — ABNORMAL HIGH (ref 11.6–15.2)

## 2013-10-27 LAB — MAGNESIUM: Magnesium: 1.8 mg/dL (ref 1.5–2.5)

## 2013-10-27 LAB — TROPONIN I: Troponin I: <0.3 ng/mL (ref <0.30)

## 2013-10-27 MED ORDER — GI COCKTAIL ~~LOC~~
30.0000 mL | Freq: Once | ORAL | Status: AC
  Administered 2013-10-27: 30 mL via ORAL
  Filled 2013-10-27: qty 30

## 2013-10-27 MED ORDER — DILTIAZEM HCL 100 MG IV SOLR
5.0000 mg/h | Freq: Once | INTRAVENOUS | Status: AC
  Administered 2013-10-27: 5 mg/h via INTRAVENOUS

## 2013-10-27 NOTE — ED Notes (Signed)
EDP at bedside  

## 2013-10-27 NOTE — ED Provider Notes (Signed)
CSN: [id number]     Arrival date & time 10/27/13  6295 History   First MD Initiated Contact with Patient 10/27/13 [phone]     Chief Complaint  Patient presents with  . Chest Pain     (Consider location/radiation/quality/duration/timing/severity/associated sxs/prior Treatment) HPI  73 year old male with palpitations. Patient woke up with a sensation of a fast irregular heart rate this morning. Some intermittent sharp chest pain in the center of his chest. Since it's been relatively constant since he first noticed them. No appreciable exacerbating or relieving factors. Patient received aspirin shortly before arrival. He does have a past history of atrial fibrillation. He is on Coumadin. Reports compliance with his medications. His cardiologist is Dr. Collier Salina torted. No unusual leg pain or swelling. No fevers or chills. No cough. No shortness of breath.  Past Medical History  Diagnosis Date  . Coronary artery disease   . Hypertension   . Diabetes mellitus   . Paroxysmal atrial fibrillation   . Current use of long term anticoagulation   . Dyslipidemia   . Edema   . Hyperlipidemia   . Gout   . History of acute myocardial infarction 11/2005    acute anteroseptal myocardial infarction  . Osteoarthritis   . Chest pain July 2012    s/p repeat cath; stable anatomy. Managed medically   Past Surgical History  Procedure Laterality Date  . Coronary artery bypass graft  2005  . Cardiac catheterization  05/18/2009  . Cardiac catheterization  July 2012    Stable anatomy. Manage medically   Family History  Problem Relation Age of Onset  . Coronary artery disease Mother   . Heart disease Mother   . Coronary artery disease Father   . Heart disease Father   . Cancer Brother   . Cancer Brother    History  Substance Use Topics  . Smoking status: Former Smoker    Types: Cigarettes    Quit date: 09/05/1982  . Smokeless tobacco: Never Used  . Alcohol Use: No    Review of Systems  All  systems reviewed and negative, other than as noted in HPI.   Allergies  Cephalexin  Home Medications   Current Outpatient Rx  Name  Route  Sig  Dispense  Refill  . acetaminophen (TYLENOL) 500 MG tablet   Oral   Take 1,000 mg by mouth daily.          Marland Kitchen allopurinol (ZYLOPRIM) 300 MG tablet   Oral   Take 300 mg by mouth daily.         Marland Kitchen amLODipine-atorvastatin (CADUET) 10-40 MG per tablet   Oral   Take 1 tablet by mouth daily.   30 tablet   6   . Ascorbic Acid (VITAMIN C) 500 MG tablet   Oral   Take 1,000 mg by mouth daily.          Marland Kitchen aspirin 81 MG tablet   Oral   Take 81 mg by mouth daily.          Marland Kitchen b complex vitamins tablet   Oral   Take 1 tablet by mouth daily.          . cloNIDine (CATAPRES) 0.1 MG tablet   Oral   Take 0.1 mg by mouth 3 (three) times daily.         . fish oil-omega-3 fatty acids 1000 MG capsule   Oral   Take 1 g by mouth 2 (two) times daily.          Marland Kitchen  furosemide (LASIX) 20 MG tablet   Oral   Take 20 mg by mouth daily.         Marland Kitchen GLIPIZIDE PO   Oral   Take 1 tablet by mouth daily.         . ISOSORBIDE MONONITRATE PO   Oral   Take 1 tablet by mouth daily.         Marland Kitchen lisinopril (PRINIVIL,ZESTRIL) 40 MG tablet   Oral   Take 1 tablet (40 mg total) by mouth daily.   30 tablet   6   . metFORMIN (GLUCOPHAGE-XR) 500 MG 24 hr tablet   Oral   Take 500 mg by mouth 2 (two) times daily with a meal.         . metoprolol succinate (TOPROL-XL) 100 MG 24 hr tablet   Oral   Take 100 mg by mouth daily. Take with or immediately following a meal.         . Multiple Vitamin (MULTIVITAMIN WITH MINERALS) TABS tablet   Oral   Take 1 tablet by mouth daily.         . niacin (NIASPAN) 500 MG CR tablet   Oral   Take 1 tablet (500 mg total) by mouth at bedtime.   30 tablet   6   . nitroGLYCERIN (NITROSTAT) 0.4 MG SL tablet   Sublingual   Place 1 tablet (0.4 mg total) under the tongue every 5 (five) minutes as needed for  chest pain.   100 tablet   3   . warfarin (COUMADIN) 5 MG tablet   Oral   Take 1 tablet (5 mg total) by mouth as directed.   100 tablet   1     100 tabs is 3 mth supply   . Lancets (ONETOUCH ULTRASOFT) lancets               . ONE TOUCH ULTRA TEST test strip                BP 138/72  Pulse 99  Temp(Src) 98.6 F (37 C)  Resp 20  SpO2 97% Physical Exam  Nursing note and vitals reviewed. Constitutional: He appears well-developed and well-nourished. No distress.  HENT:  Head: Normocephalic and atraumatic.  Eyes: Conjunctivae are normal. Right eye exhibits no discharge. Left eye exhibits no discharge.  Neck: Neck supple.  Cardiovascular: Regular rhythm and normal heart sounds.  Exam reveals no gallop and no friction rub.   No murmur heard. Tachycardic with an irregularly irregular rate  Pulmonary/Chest: Effort normal and breath sounds normal. No respiratory distress.  Abdominal: Soft. He exhibits no distension. There is no tenderness.  Musculoskeletal: He exhibits no edema and no tenderness.  Neurological: He is alert.  Skin: Skin is warm and dry.  Psychiatric: He has a normal mood and affect. His behavior is normal. Thought content normal.    ED Course  Procedures (including critical care time)  CRITICAL CARE Performed by: Virgel Manifold  Total critical care time: 35 minutes  Critical care time was exclusive of separately billable procedures and treating other patients. Critical care was necessary to treat or prevent imminent or life-threatening deterioration. Critical care was time spent personally by me on the following activities: development of treatment plan with patient and/or surrogate as well as nursing, discussions with consultants, evaluation of patient's response to treatment, examination of patient, obtaining history from patient or surrogate, ordering and performing treatments and interventions, ordering and review of laboratory studies, ordering and  review  of radiographic studies, pulse oximetry and re-evaluation of patient's condition.  Labs Review Labs Reviewed  BASIC METABOLIC PANEL - Abnormal; Notable for the following:    Glucose, Bld 170 (*)    GFR calc non Af Amer 71 (*)    GFR calc Af Amer 82 (*)    All other components within normal limits  PROTIME-INR - Abnormal; Notable for the following:    Prothrombin Time 21.3 (*)    INR 1.91 (*)    All other components within normal limits  CBC WITH DIFFERENTIAL - Abnormal; Notable for the following:    WBC 10.9 (*)    All other components within normal limits  MAGNESIUM  TROPONIN I   Imaging Review No results found.  EKG Interpretation    Date/Time:  Sunday October 27 2013 09:03:00 EST Ventricular Rate:  147 PR Interval:  251 QRS Duration: 86 QT Interval:  344 QTC Calculation: 538 R Axis:   58 Text Interpretation:  Age not entered, assumed to be  73 years old for purpose of ECG interpretation Sinus tachycardia Prolonged PR interval Prominent P waves, nondiagnostic Low voltage, precordial leads Consider anterior infarct Minimal ST depression, anterolateral leads Prolonged QT interval ED PHYSICIAN INTERPRETATION AVAILABLE IN [facility] Confirmed by TEST, RECORD ([id number]) on 10/29/2013 7:30:29 AM            MDM   Final diagnoses:  Atrial fibrillation with rapid ventricular response   73 year old male with palpitations. History of atrial fibrillation. Percent nature fibrillation but converted to sinus rhythm after administration of Cardizem. It sounds like patient has fairly infrequent episodes of this. I would not make any changes to his medications at this time and referred to his cardiologist. He is on Coumadin. His INR is acceptable range today. I feel he is stable for discharge at this time. He currently has no complaints complete resolution of his symptoms with return to sinus rhythm. I feel is appropriate for follow back up with his cardiologist. Emergent  return precautions were discussed.    Virgel Manifold, MD 10/31/13 [phone]

## 2013-10-27 NOTE — Discharge Instructions (Signed)
Atrial Fibrillation  Atrial fibrillation is a type of irregular heart rhythm (arrhythmia). During atrial fibrillation, the upper chambers of the heart (atria) quiver continuously in a chaotic pattern. This causes an irregular and often rapid heart rate.   Atrial fibrillation is the result of the heart becoming overloaded with disorganized signals that tell it to beat. These signals are normally released one at a time by a part of the right atrium called the sinoatrial node. They then travel from the atria to the lower chambers of the heart (ventricles), causing the atria and ventricles to contract and pump blood as they pass. In atrial fibrillation, parts of the atria outside of the sinoatrial node also release these signals. This results in two problems. First, the atria receive so many signals that they do not have time to fully contract. Second, the ventricles, which can only receive one signal at a time, beat irregularly and out of rhythm with the atria.   There are three types of atrial fibrillation:    Paroxysmal Paroxysmal atrial fibrillation starts suddenly and stops on its own within a week.    Persistent Persistent atrial fibrillation lasts for more than a week. It may stop on its own or with treatment.    Permanent Permanent atrial fibrillation does not go away. Episodes of atrial fibrillation may lead to permanent atrial fibrillation.   Atrial fibrillation can prevent your heart from pumping blood normally. It increases your risk of stroke and can lead to heart failure.   CAUSES    Heart conditions, including a heart attack, heart failure, coronary artery disease, and heart valve conditions.    Inflammation of the sac that surrounds the heart (pericarditis).    Blockage of an artery in the lungs (pulmonary embolism).    Pneumonia or other infections.    Chronic lung disease.    Thyroid problems, especially if the thyroid is overactive (hyperthyroidism).    Caffeine, excessive alcohol  use, and use of some illegal drugs.    Use of some medications, including certain decongestants and diet pills.    Heart surgery.    Birth defects.   Sometimes, no cause can be found. When this happens, the atrial fibrillation is called lone atrial fibrillation. The risk of complications from atrial fibrillation increases if you have lone atrial fibrillation and you are age 60 years or older.  RISK FACTORS   Heart failure.   Coronary artery disease   Diabetes mellitus.    High blood pressure (hypertension).    Obesity.    Other arrhythmias.    Increased age.  SYMPTOMS    A feeling that your heart is beating rapidly or irregularly.    A feeling of discomfort or pain in your chest.    Shortness of breath.    Sudden lightheadedness or weakness.    Getting tired easily when exercising.    Urinating more often than normal (mainly when atrial fibrillation first begins).   In paroxysmal atrial fibrillation, symptoms may start and suddenly stop.  DIAGNOSIS   Your caregiver may be able to detect atrial fibrillation when taking your pulse. Usually, testing is needed to diagnosis atrial fibrillation. Tests may include:    Electrocardiography. During this test, the electrical impulses of your heart are recorded while you are lying down.    Echocardiography. During echocardiography, sound waves are used to evaluate how blood flows through your heart.    Stress test. There is more than one type of stress test. If a stress test is   needed, ask your caregiver about which type is best for you.    Chest X-ray exam.    Blood tests.    Computed tomography (CT).   TREATMENT    Treating any underlying conditions. For example, if you have an overactive thyroid, treating the condition may correct atrial fibrillation.    Medication. Medications may be given to control a rapid heart rate or to prevent blood clots, heart failure, or a stroke.    Procedure to correct the rhythm of the  heart:   Electrical cardioversion. During electrical cardioversion, a controlled, low-energy shock is delivered to the heart through your skin. If you have chest pain, very low pressure blood pressure, or sudden heart failure, this procedure may need to be done as an emergency.   Catheter ablation. During this procedure, heart tissues that send the signals that cause atrial fibrillation are destroyed.   Maze or minimaze procedure. During this surgery, thin lines of heart tissue that carry the abnormal signals are destroyed. The maze procedure is an open-heart surgery. The minimaze procedure is a minimally invasive surgery. This means that small cuts are made to access the heart instead of a large opening.   Pulmonary venous isolation. During this surgery, tissue around the veins that carry blood from the lungs (pulmonary veins) is destroyed. This tissue is thought to carry the abnormal signals.  HOME CARE INSTRUCTIONS    Take medications as directed by your caregiver.   Only take medications that your caregiver approves. Some medications can make atrial fibrillation worse or recur.   If blood thinners were prescribed by your caregiver, take them exactly as directed. Too much can cause bleeding. Too little and you will not have the needed protection against stroke and other problems.   Perform blood tests at home if directed by your caregiver.   Perform blood tests exactly as directed.    Quit smoking if you smoke.    Do not drink alcohol.    Do not drink caffeinated beverages such as coffee, soda, and some teas. You may drink decaffeinated coffee, soda, or tea.    Maintain a healthy weight. Do not use diet pills unless your caregiver approves. They may make heart problems worse.    Follow diet instructions as directed by your caregiver.    Exercise regularly as directed by your caregiver.    Keep all follow-up appointments.  PREVENTION   The following substances can cause atrial fibrillation  to recur:    Caffeinated beverages.    Alcohol.    Certain medications, especially those used for breathing problems.    Certain herbs and herbal medications, such as those containing ephedra or ginseng.   Illegal drugs such as cocaine and amphetamines.  Sometimes medications are given to prevent atrial fibrillation from recurring. Proper treatment of any underlying condition is also important in helping prevent recurrence.   SEEK MEDICAL CARE IF:   You notice a change in the rate, rhythm, or strength of your heartbeat.    You suddenly begin urinating more frequently.    You tire more easily when exerting yourself or exercising.   SEEK IMMEDIATE MEDICAL CARE IF:    You develop chest pain, abdominal pain, sweating, or weakness.   You feel sick to your stomach (nauseous).   You develop shortness of breath.   You suddenly develop swollen feet and ankles.   You feel dizzy.   You face or limbs feel numb or weak.   There is a change in your   vision or speech.  MAKE SURE YOU:    Understand these instructions.   Will watch your condition.   Will get help right away if you are not doing well or get worse.  Document Released: 08/22/2005 Document Revised: 12/17/2012 Document Reviewed: 10/02/2012  [employer] Patient Information 2014 [employer].

## 2013-10-27 NOTE — ED Notes (Signed)
EKG completed and given to EDP.  

## 2013-10-27 NOTE — ED Notes (Signed)
Woke up today with chest pain 4-5/10 sharp with palpitations. History of a-fib. EMS given one nitro and 324 mg aspirin prior to arrival.

## 2013-10-28 ENCOUNTER — Telehealth: Payer: Self-pay | Admitting: Cardiology

## 2013-10-28 NOTE — Telephone Encounter (Signed)
Returned call to patient he stated he had episode of fast heart beat and chest pain yesterday 10/27/13.Stated he went to [facility] ER and was given IV medication.Stated he was told to see his Dr.No chest pain or fast heart beat today. Appointment scheduled with Dr.Jordan 10/29/13 at 4:30 pm.

## 2013-10-28 NOTE — Telephone Encounter (Signed)
New message     Pt went to  yesterday---heart flutter and chest pain.  Want doctor to know.

## 2013-10-28 NOTE — Telephone Encounter (Signed)
isosorbide mononitrate (IMDUR) 30 MG 24 hr tablet  TAKE 1 TABLET DAILY   Patient Instructions  Stop Niaspan  Continue your other therapy Try and get more exercise and lose weight.  Luanna Salk, LPN  on 7/61/9509  3:26 PM  No change in medications from ED visit 10/27/2013

## 2013-10-29 ENCOUNTER — Ambulatory Visit: Payer: Medicare Other | Admitting: Cardiology

## 2013-10-30 ENCOUNTER — Encounter (INDEPENDENT_AMBULATORY_CARE_PROVIDER_SITE_OTHER): Payer: Self-pay

## 2013-10-30 ENCOUNTER — Ambulatory Visit (INDEPENDENT_AMBULATORY_CARE_PROVIDER_SITE_OTHER): Payer: Medicare Other | Admitting: Cardiology

## 2013-10-30 ENCOUNTER — Other Ambulatory Visit: Payer: Self-pay

## 2013-10-30 ENCOUNTER — Encounter: Payer: Self-pay | Admitting: Cardiology

## 2013-10-30 VITALS — BP 151/74 | HR 66 | Ht 69.0 in | Wt 215.0 lb

## 2013-10-30 DIAGNOSIS — I10 Essential (primary) hypertension: Secondary | ICD-10-CM

## 2013-10-30 DIAGNOSIS — I4892 Unspecified atrial flutter: Secondary | ICD-10-CM

## 2013-10-30 DIAGNOSIS — Z7901 Long term (current) use of anticoagulants: Secondary | ICD-10-CM

## 2013-10-30 DIAGNOSIS — I2510 Atherosclerotic heart disease of native coronary artery without angina pectoris: Secondary | ICD-10-CM

## 2013-10-30 NOTE — Progress Notes (Signed)
[name] Date of Birth: [birth year]   History of Present Illness: [name] is seen today for a followup visit.  He has a history of coronary disease with prior coronary bypass surgery in 2005. Last evaluation with cardiac catheterization in July of 2012 showed a patent LIMA graft to the LAD. The second marginal branch was occluded in the first marginal branch had a moderately severe stenosis. This is treated medically. His last Myoview study in November 2013 showed a small apical fixed defect without ischemia. Ejection fraction 69%. He does have a history of paroxysmal atrial fibrillation and is on chronic Coumadin therapy. On October 27, 2013 he developed a fluttering sensation in his chest which did not resolve. He had minor chest pain. He was recently treated for a URI with amoxicillin. In the ED he was noted to be in atrial flutter with rate of 147 bpm. He was given IV cardizem and converted to NSR. No recurrence since then. Prior to this episode he had infrequent mild palpitations.   Current Outpatient Prescriptions on File Prior to Visit  Medication Sig Dispense Refill  . acetaminophen (TYLENOL) 500 MG tablet Take 1,000 mg by mouth daily.       Marland Kitchen allopurinol (ZYLOPRIM) 300 MG tablet Take 300 mg by mouth daily.      Marland Kitchen amLODipine-atorvastatin (CADUET) 10-40 MG per tablet Take 1 tablet by mouth daily.  30 tablet  6  . Ascorbic Acid (VITAMIN C) 500 MG tablet Take 1,000 mg by mouth daily.       Marland Kitchen aspirin 81 MG tablet Take 81 mg by mouth daily.       Marland Kitchen b complex vitamins tablet Take 1 tablet by mouth daily.       . cloNIDine (CATAPRES) 0.1 MG tablet Take 0.1 mg by mouth 3 (three) times daily.      . fish oil-omega-3 fatty acids 1000 MG capsule Take 1 g by mouth 2 (two) times daily.       . furosemide (LASIX) 20 MG tablet Take 20 mg by mouth daily.      Marland Kitchen GLIPIZIDE PO Take 1 tablet by mouth daily.      . isosorbide mononitrate (IMDUR) 30 MG 24 hr tablet TAKE 1 TABLET DAILY  90  tablet  1  . ISOSORBIDE MONONITRATE PO Take 1 tablet by mouth daily.      . Lancets (ONETOUCH ULTRASOFT) lancets       . lisinopril (PRINIVIL,ZESTRIL) 40 MG tablet Take 1 tablet (40 mg total) by mouth daily.  30 tablet  6  . metFORMIN (GLUCOPHAGE-XR) 500 MG 24 hr tablet Take 500 mg by mouth 2 (two) times daily with a meal.      . metoprolol succinate (TOPROL-XL) 100 MG 24 hr tablet Take 100 mg by mouth daily. Take with or immediately following a meal.      . Multiple Vitamin (MULTIVITAMIN WITH MINERALS) TABS tablet Take 1 tablet by mouth daily.      . niacin (NIASPAN) 500 MG CR tablet Take 1 tablet (500 mg total) by mouth at bedtime.  30 tablet  6  . ONE TOUCH ULTRA TEST test strip       . warfarin (COUMADIN) 5 MG tablet Take 1 tablet (5 mg total) by mouth as directed.  100 tablet  1  . nitroGLYCERIN (NITROSTAT) 0.4 MG SL tablet Place 1 tablet (0.4 mg total) under the tongue every 5 (five) minutes as needed for chest pain.  100 tablet  3  No current facility-administered medications on file prior to visit.    Allergies  Allergen Reactions  . Cephalexin Rash    Past Medical History  Diagnosis Date  . Coronary artery disease   . Hypertension   . Diabetes mellitus   . Paroxysmal atrial fibrillation   . Current use of long term anticoagulation   . Dyslipidemia   . Edema   . Hyperlipidemia   . Gout   . History of acute myocardial infarction 11/2005    acute anteroseptal myocardial infarction  . Osteoarthritis   . Chest pain July 2012    s/p repeat cath; stable anatomy. Managed medically    Past Surgical History  Procedure Laterality Date  . Coronary artery bypass graft  2005  . Cardiac catheterization  05/18/2009  . Cardiac catheterization  July 2012    Stable anatomy. Manage medically    History  Smoking status  . Former Smoker  . Types: Cigarettes  . Quit date: 09/05/1982  Smokeless tobacco  . Never Used    History  Alcohol Use No    Family History  Problem  Relation Age of Onset  . Coronary artery disease Mother   . Heart disease Mother   . Coronary artery disease Father   . Heart disease Father   . Cancer Brother   . Cancer Brother     Review of Systems: The review of systems is as above.  All other systems were reviewed and are negative.  Physical Exam: BP 151/74  Pulse 66  Ht 5\' 9"  (1.753 m)  Wt 215 lb (97.523 kg)  BMI 31.74 kg/m2 Patient is very pleasant and in no acute distress. Skin is warm and dry. Color is normal.  HEENT is unremarkable. Normocephalic/atraumatic. PERRL. Sclera are nonicteric. Neck is supple. No masses. No JVD. Lungs are clear. Cardiac exam shows a regular rate and rhythm. Abdomen is soft. Extremities reveal no edema.  Gait and ROM are intact. No gross neurologic deficits noted.  LABORATORY DATA: INR 10/27/13-1.9 Lab Results  Component Value Date   WBC 10.9* 10/27/2013   HGB 16.0 10/27/2013   HCT 45.6 10/27/2013   PLT 240 10/27/2013   GLUCOSE 170* 10/27/2013   CHOL 135 04/02/2012   TRIG 130.0 04/02/2012   HDL 41.30 04/02/2012   LDLCALC 68 04/02/2012   ALT 28 03/18/2011   AST 27 03/18/2011   NA 142 10/27/2013   K 4.3 10/27/2013   CL 102 10/27/2013   CREATININE 1.03 10/27/2013   BUN 11 10/27/2013   CO2 25 10/27/2013   TSH 0.882 03/19/2011   INR 1.91* 10/27/2013   HGBA1C  Value: 6.3 (NOTE) The ADA recommends the following therapeutic goal for glycemic control related to Hgb A1c measurement: Goal of therapy: <6.5 Hgb A1c  Reference: American Diabetes Association: Clinical Practice Recommendations 2010, Diabetes Care, 2010, 33: (Suppl  1).* 05/16/2009     Ecg: 10/27/13- atrial flutter with RVR.   Assessment / Plan:  1. Coronary disease status post CABG. Vein graft to the OM is known to be occluded. He is asymptomatic on medical therapy. Myoview study in November showed no significant ischemia. He will continue with his medical therapy including metoprolol, isosorbide, and amlodipine.  2. Atrial fibrillation. Now with  recent episode of Atrial flutter with RVR. Converted to NSR. He is on chronic Coumadin therapy. He remains in sinus rhythm at this time. We'll continue with rate control with metoprolol. I will update his Echo. If he has recurrent episodes may need to consider  antiarrhythmic drug therapy or ablation.  3. Hyperlipidemia. On statin therapy  I will follow up in 2 months.

## 2013-10-30 NOTE — Patient Instructions (Addendum)
Continue your current therapy. Avoid caffeine.  We will schedule your for an Echocardiogram  I will see you in 2 months.  If you have another episode of arrhythmia-- rest and take an extra 50 mg Toprol. If you are having pain or shortness of breath or if rhythm doesn't convert in 1-2 hours seek medical attention.

## 2013-11-18 ENCOUNTER — Ambulatory Visit (HOSPITAL_COMMUNITY): Payer: Medicare Other | Attending: Internal Medicine | Admitting: Radiology

## 2013-11-18 ENCOUNTER — Encounter: Payer: Self-pay | Admitting: Internal Medicine

## 2013-11-18 ENCOUNTER — Ambulatory Visit (INDEPENDENT_AMBULATORY_CARE_PROVIDER_SITE_OTHER): Payer: Medicare Other | Admitting: Pharmacist

## 2013-11-18 DIAGNOSIS — I4891 Unspecified atrial fibrillation: Secondary | ICD-10-CM

## 2013-11-18 DIAGNOSIS — I10 Essential (primary) hypertension: Secondary | ICD-10-CM | POA: Insufficient documentation

## 2013-11-18 DIAGNOSIS — E669 Obesity, unspecified: Secondary | ICD-10-CM | POA: Insufficient documentation

## 2013-11-18 DIAGNOSIS — E119 Type 2 diabetes mellitus without complications: Secondary | ICD-10-CM | POA: Insufficient documentation

## 2013-11-18 DIAGNOSIS — I2510 Atherosclerotic heart disease of native coronary artery without angina pectoris: Secondary | ICD-10-CM

## 2013-11-18 DIAGNOSIS — I252 Old myocardial infarction: Secondary | ICD-10-CM | POA: Insufficient documentation

## 2013-11-18 DIAGNOSIS — E785 Hyperlipidemia, unspecified: Secondary | ICD-10-CM | POA: Insufficient documentation

## 2013-11-18 DIAGNOSIS — I079 Rheumatic tricuspid valve disease, unspecified: Secondary | ICD-10-CM | POA: Insufficient documentation

## 2013-11-18 DIAGNOSIS — I059 Rheumatic mitral valve disease, unspecified: Secondary | ICD-10-CM | POA: Insufficient documentation

## 2013-11-18 DIAGNOSIS — Z87891 Personal history of nicotine dependence: Secondary | ICD-10-CM | POA: Insufficient documentation

## 2013-11-18 DIAGNOSIS — R609 Edema, unspecified: Secondary | ICD-10-CM | POA: Insufficient documentation

## 2013-11-18 DIAGNOSIS — Z7901 Long term (current) use of anticoagulants: Secondary | ICD-10-CM

## 2013-11-18 DIAGNOSIS — I4892 Unspecified atrial flutter: Secondary | ICD-10-CM | POA: Insufficient documentation

## 2013-11-18 LAB — POCT INR: INR: 2.4

## 2013-11-18 NOTE — Progress Notes (Signed)
Echocardiogram performed.  

## 2013-12-23 ENCOUNTER — Ambulatory Visit (INDEPENDENT_AMBULATORY_CARE_PROVIDER_SITE_OTHER): Payer: Medicare Other | Admitting: *Deleted

## 2013-12-23 DIAGNOSIS — I4891 Unspecified atrial fibrillation: Secondary | ICD-10-CM

## 2013-12-23 DIAGNOSIS — Z5181 Encounter for therapeutic drug level monitoring: Secondary | ICD-10-CM

## 2013-12-23 DIAGNOSIS — Z7901 Long term (current) use of anticoagulants: Secondary | ICD-10-CM

## 2013-12-23 LAB — POCT INR: INR: 2.2

## 2014-01-17 ENCOUNTER — Ambulatory Visit: Payer: Medicare Other | Admitting: Cardiology

## 2014-01-23 ENCOUNTER — Ambulatory Visit (INDEPENDENT_AMBULATORY_CARE_PROVIDER_SITE_OTHER): Payer: Medicare Other | Admitting: Cardiology

## 2014-01-23 ENCOUNTER — Encounter: Payer: Self-pay | Admitting: Cardiology

## 2014-01-23 VITALS — BP 136/62 | HR 76 | Ht 69.0 in | Wt 219.0 lb

## 2014-01-23 DIAGNOSIS — I4892 Unspecified atrial flutter: Secondary | ICD-10-CM

## 2014-01-23 DIAGNOSIS — I10 Essential (primary) hypertension: Secondary | ICD-10-CM

## 2014-01-23 DIAGNOSIS — Z7901 Long term (current) use of anticoagulants: Secondary | ICD-10-CM

## 2014-01-23 DIAGNOSIS — I2510 Atherosclerotic heart disease of native coronary artery without angina pectoris: Secondary | ICD-10-CM

## 2014-01-23 DIAGNOSIS — I4891 Unspecified atrial fibrillation: Secondary | ICD-10-CM

## 2014-01-23 NOTE — Patient Instructions (Signed)
Continue your current therapy  I will see you in 6 months with fasting lab work.   

## 2014-01-23 NOTE — Progress Notes (Signed)
[name] Date of Birth: Mar 13, 1941   History of Present Illness: [name] is seen today for a followup visit.  He has a history of coronary disease with prior coronary bypass surgery in 2005. Last evaluation with cardiac catheterization in July of 2012 showed a patent LIMA graft to the LAD. The second marginal branch was occluded and the first marginal branch had a moderately severe stenosis. This is treated medically. His last Myoview study in November 2013 showed a small apical fixed defect without ischemia. Ejection fraction 69%. He does have a history of paroxysmal atrial fibrillation and is on chronic Coumadin therapy. In February 2015 he had atrial flutter that resolved with IV Cardizem. He denies any significant palpitations since then. Echo in March showed a normal study except for mild diastolic dysfunction. On follow up he does note some dyspnea and fatigue going up stairs but otherwise feels well.   Current Outpatient Prescriptions on File Prior to Visit  Medication Sig Dispense Refill  . acetaminophen (TYLENOL) 500 MG tablet Take 1,000 mg by mouth daily.       Marland Kitchen allopurinol (ZYLOPRIM) 300 MG tablet Take 300 mg by mouth daily.      Marland Kitchen amLODipine-atorvastatin (CADUET) 10-40 MG per tablet Take 1 tablet by mouth daily.  30 tablet  6  . Ascorbic Acid (VITAMIN C) 500 MG tablet Take 1,000 mg by mouth daily.       Marland Kitchen b complex vitamins tablet Take 1 tablet by mouth daily.       . cloNIDine (CATAPRES) 0.1 MG tablet Take 0.1 mg by mouth 3 (three) times daily.      . fish oil-omega-3 fatty acids 1000 MG capsule Take 1 g by mouth 2 (two) times daily.       . furosemide (LASIX) 20 MG tablet Take 20 mg by mouth daily.      Marland Kitchen GLIPIZIDE PO Take 1 tablet by mouth daily.      . isosorbide mononitrate (IMDUR) 30 MG 24 hr tablet TAKE 1 TABLET DAILY  90 tablet  1  . ISOSORBIDE MONONITRATE PO Take 1 tablet by mouth daily.      . Lancets (ONETOUCH ULTRASOFT) lancets       . lisinopril  (PRINIVIL,ZESTRIL) 40 MG tablet Take 1 tablet (40 mg total) by mouth daily.  30 tablet  6  . metFORMIN (GLUCOPHAGE-XR) 500 MG 24 hr tablet Take 500 mg by mouth 2 (two) times daily with a meal.      . metoprolol succinate (TOPROL-XL) 100 MG 24 hr tablet Take 100 mg by mouth daily. Take with or immediately following a meal.      . Multiple Vitamin (MULTIVITAMIN WITH MINERALS) TABS tablet Take 1 tablet by mouth daily.      . ONE TOUCH ULTRA TEST test strip       . warfarin (COUMADIN) 5 MG tablet Take 1 tablet (5 mg total) by mouth as directed.  100 tablet  1  . nitroGLYCERIN (NITROSTAT) 0.4 MG SL tablet Place 1 tablet (0.4 mg total) under the tongue every 5 (five) minutes as needed for chest pain.  100 tablet  3   No current facility-administered medications on file prior to visit.    Allergies  Allergen Reactions  . Cephalexin Rash    Past Medical History  Diagnosis Date  . Coronary artery disease   . Hypertension   . Diabetes mellitus   . Paroxysmal atrial fibrillation   . Current use of long term anticoagulation   .  Dyslipidemia   . Edema   . Hyperlipidemia   . Gout   . History of acute myocardial infarction 11/2005    acute anteroseptal myocardial infarction  . Osteoarthritis   . Chest pain July 2012    s/p repeat cath; stable anatomy. Managed medically    Past Surgical History  Procedure Laterality Date  . Coronary artery bypass graft  2005  . Cardiac catheterization  05/18/2009  . Cardiac catheterization  July 2012    Stable anatomy. Manage medically    History  Smoking status  . Former Smoker  . Types: Cigarettes  . Quit date: 09/05/1982  Smokeless tobacco  . Never Used    History  Alcohol Use No    Family History  Problem Relation Age of Onset  . Coronary artery disease Mother   . Heart disease Mother   . Coronary artery disease Father   . Heart disease Father   . Cancer Brother   . Cancer Brother     Review of Systems: The review of systems is as  above.  All other systems were reviewed and are negative.  Physical Exam: BP 136/62  Pulse 76  Ht 5\' 9"  (1.753 m)  Wt 219 lb (99.338 kg)  BMI 32.33 kg/m2  SpO2 95% Patient is very pleasant and in no acute distress. Skin is warm and dry. Color is normal.  HEENT is unremarkable. Normocephalic/atraumatic. PERRL. Sclera are nonicteric. Neck is supple. No masses. No JVD. Lungs are clear. Cardiac exam shows a regular rate and rhythm. Abdomen is soft. Extremities reveal mild ankle edema.  Gait and ROM are intact. No gross neurologic deficits noted.  LABORATORY DATA: INR 2.2 in April.   Lab Results  Component Value Date   WBC 10.9* 10/27/2013   HGB 16.0 10/27/2013   HCT 45.6 10/27/2013   PLT 240 10/27/2013   GLUCOSE 170* 10/27/2013   CHOL 135 04/02/2012   TRIG 130.0 04/02/2012   HDL 41.30 04/02/2012   LDLCALC 68 04/02/2012   ALT 28 03/18/2011   AST 27 03/18/2011   NA 142 10/27/2013   K 4.3 10/27/2013   CL 102 10/27/2013   CREATININE 1.03 10/27/2013   BUN 11 10/27/2013   CO2 25 10/27/2013   TSH 0.882 03/19/2011   INR 2.2 12/23/2013   HGBA1C  Value: 6.3 (NOTE) The ADA recommends the following therapeutic goal for glycemic control related to Hgb A1c measurement: Goal of therapy: <6.5 Hgb A1c  Reference: American Diabetes Association: Clinical Practice Recommendations 2010, Diabetes Care, 2010, 33: (Suppl  1).* 05/16/2009     Ecg: 10/27/13- atrial flutter with RVR.  Echo: 11/18/13:Study Conclusions  - Left ventricle: The cavity size was normal. Wall thickness was normal. Systolic function was normal. The estimated ejection fraction was in the range of 60% to 65%. Wall motion was normal; there were no regional wall motion abnormalities. Doppler parameters are consistent with abnormal left ventricular relaxation (grade 1 diastolic dysfunction). The E/e' ratio is >10, suggesting elevated LV filling pressure. - Left atrium: The atrium was normal in size. - Atrial septum: No defect or patent foramen  ovale was identified. - Inferior vena cava: The vessel was normal in size; the respirophasic diameter changes were in the normal range (= 50%); findings are consistent with normal central venous pressure. - Pericardium, extracardiac: There was no pericardial effusion.    Assessment / Plan:  1. Coronary disease status post CABG. Vein graft to the OM is known to be occluded. He is asymptomatic on medical  therapy. Myoview study in November showed no significant ischemia. He will continue with his medical therapy including metoprolol, isosorbide, and amlodipine.  2. Atrial fibrillation/flutter. He is on chronic Coumadin therapy. He remains in sinus rhythm at this time. We'll continue with rate control with metoprolol. Since he is on coumadin will stop ASA.  3. Hyperlipidemia. On statin therapy. Now off niacin.  I will follow up in 6 months with fasting lab work.

## 2014-02-03 ENCOUNTER — Ambulatory Visit (INDEPENDENT_AMBULATORY_CARE_PROVIDER_SITE_OTHER): Payer: Medicare Other | Admitting: *Deleted

## 2014-02-03 DIAGNOSIS — Z7901 Long term (current) use of anticoagulants: Secondary | ICD-10-CM

## 2014-02-03 DIAGNOSIS — I4891 Unspecified atrial fibrillation: Secondary | ICD-10-CM

## 2014-02-03 DIAGNOSIS — Z5181 Encounter for therapeutic drug level monitoring: Secondary | ICD-10-CM

## 2014-02-03 LAB — POCT INR: INR: 2.2

## 2014-03-17 ENCOUNTER — Ambulatory Visit (INDEPENDENT_AMBULATORY_CARE_PROVIDER_SITE_OTHER): Payer: Medicare Other

## 2014-03-17 DIAGNOSIS — Z7901 Long term (current) use of anticoagulants: Secondary | ICD-10-CM

## 2014-03-17 DIAGNOSIS — Z5181 Encounter for therapeutic drug level monitoring: Secondary | ICD-10-CM

## 2014-03-17 DIAGNOSIS — I4891 Unspecified atrial fibrillation: Secondary | ICD-10-CM

## 2014-03-17 LAB — POCT INR: INR: 2

## 2014-04-03 ENCOUNTER — Other Ambulatory Visit: Payer: Self-pay | Admitting: Cardiology

## 2014-04-06 ENCOUNTER — Other Ambulatory Visit: Payer: Self-pay | Admitting: Cardiology

## 2014-04-21 ENCOUNTER — Other Ambulatory Visit: Payer: Self-pay | Admitting: Cardiology

## 2014-05-05 ENCOUNTER — Ambulatory Visit (INDEPENDENT_AMBULATORY_CARE_PROVIDER_SITE_OTHER): Payer: Medicare Other

## 2014-05-05 ENCOUNTER — Other Ambulatory Visit: Payer: Self-pay | Admitting: Cardiology

## 2014-05-05 DIAGNOSIS — Z5181 Encounter for therapeutic drug level monitoring: Secondary | ICD-10-CM

## 2014-05-05 DIAGNOSIS — Z7901 Long term (current) use of anticoagulants: Secondary | ICD-10-CM

## 2014-05-05 DIAGNOSIS — I4891 Unspecified atrial fibrillation: Secondary | ICD-10-CM

## 2014-05-05 LAB — POCT INR: INR: 2.5

## 2014-06-02 ENCOUNTER — Other Ambulatory Visit: Payer: Self-pay | Admitting: Dermatology

## 2014-06-03 ENCOUNTER — Other Ambulatory Visit: Payer: Self-pay | Admitting: Dermatology

## 2014-06-04 ENCOUNTER — Other Ambulatory Visit: Payer: Self-pay

## 2014-06-04 MED ORDER — FUROSEMIDE 20 MG PO TABS: 20.0000 mg | ORAL_TABLET | Freq: Every day | ORAL | Status: DC

## 2014-06-11 ENCOUNTER — Telehealth: Payer: Self-pay | Admitting: Cardiology

## 2014-06-11 NOTE — Telephone Encounter (Signed)
Called spoke with pt advised of Dr Doug Sou recommendations, OK to hold Coumadin 5 days prior to surgery, and does not need to bridge with Lovenox prior to surgery.  Pt verbalized understanding.  Rescheduled appt for 06/16/14 as he will be holding Coumadin at that time.  Will recheck INR on 06/25/14 1 week after he resumes Coumadin post procedure.  Pt aware to resume Coumadin after surgery as soon as surgeon states it is safe to do so.

## 2014-06-11 NOTE — Telephone Encounter (Signed)
He does not need bridging Lovenox. He can just hold coumadin for 5 days prior to surgery.  Skyley Grandmaison Martinique MD, [facility]

## 2014-06-11 NOTE — Telephone Encounter (Signed)
New message     Pt needs plastic surgery.  He is on coumadin.  The surgeon want him to stop coumadin and go on lovenox.  Will this be ok with Dr Martinique?

## 2014-06-11 NOTE — Telephone Encounter (Signed)
Called spoke with pt, pt is scheduled for surgery by Dr Theodoro Kos on 06/18/14.  Pt has been instructed to hold Coumadin x 5 days prior to surgery on 06/14/14 and they rx Lovenox 30mg  daily for pt to take prior to surgery.  This is not a treatment dosage of Lovenox for this pt. Is it OK for pt to hold Coumadin prior to surgery, and does this pt need to be bridged with Lovenox?  Please advise, Thanks.

## 2014-06-12 ENCOUNTER — Encounter (HOSPITAL_BASED_OUTPATIENT_CLINIC_OR_DEPARTMENT_OTHER): Payer: Self-pay | Admitting: *Deleted

## 2014-06-12 NOTE — Progress Notes (Signed)
To come in 10/13 for pt ptt bmet Sees dr Myrtie Soman to stop coumadin 5 days

## 2014-06-12 NOTE — Progress Notes (Signed)
06/12/14 1300  OBSTRUCTIVE SLEEP APNEA  Have you ever been diagnosed with sleep apnea through a sleep study? No  Do you snore loudly (loud enough to be heard through closed doors)?  0  Do you often feel tired, fatigued, or sleepy during the daytime? 0  Has anyone observed you stop breathing during your sleep? 0  Do you have, or are you being treated for high blood pressure? 1  BMI more than 35 kg/m2? 0  Age over 72 years old? 1  Neck circumference greater than 40 cm/16 inches? 1  Gender: 1  Obstructive Sleep Apnea Score 4  Score 4 or greater  Results sent to PCP

## 2014-06-17 ENCOUNTER — Encounter (HOSPITAL_BASED_OUTPATIENT_CLINIC_OR_DEPARTMENT_OTHER): Payer: Medicare Other

## 2014-06-17 ENCOUNTER — Other Ambulatory Visit: Payer: Self-pay | Admitting: Plastic Surgery

## 2014-06-17 DIAGNOSIS — M952 Other acquired deformity of head: Secondary | ICD-10-CM

## 2014-06-17 DIAGNOSIS — I10 Essential (primary) hypertension: Secondary | ICD-10-CM | POA: Diagnosis not present

## 2014-06-17 DIAGNOSIS — Z87891 Personal history of nicotine dependence: Secondary | ICD-10-CM | POA: Diagnosis not present

## 2014-06-17 DIAGNOSIS — I252 Old myocardial infarction: Secondary | ICD-10-CM | POA: Diagnosis not present

## 2014-06-17 DIAGNOSIS — M1990 Unspecified osteoarthritis, unspecified site: Secondary | ICD-10-CM | POA: Diagnosis not present

## 2014-06-17 DIAGNOSIS — Z888 Allergy status to other drugs, medicaments and biological substances status: Secondary | ICD-10-CM | POA: Diagnosis not present

## 2014-06-17 DIAGNOSIS — Z9889 Other specified postprocedural states: Secondary | ICD-10-CM

## 2014-06-17 DIAGNOSIS — L988 Other specified disorders of the skin and subcutaneous tissue: Secondary | ICD-10-CM | POA: Diagnosis not present

## 2014-06-17 DIAGNOSIS — I2510 Atherosclerotic heart disease of native coronary artery without angina pectoris: Secondary | ICD-10-CM | POA: Diagnosis not present

## 2014-06-17 DIAGNOSIS — Z7901 Long term (current) use of anticoagulants: Secondary | ICD-10-CM | POA: Diagnosis not present

## 2014-06-17 DIAGNOSIS — E119 Type 2 diabetes mellitus without complications: Secondary | ICD-10-CM | POA: Diagnosis not present

## 2014-06-17 DIAGNOSIS — E785 Hyperlipidemia, unspecified: Secondary | ICD-10-CM | POA: Diagnosis not present

## 2014-06-17 DIAGNOSIS — C4339 Malignant melanoma of other parts of face: Secondary | ICD-10-CM | POA: Diagnosis not present

## 2014-06-17 DIAGNOSIS — I480 Paroxysmal atrial fibrillation: Secondary | ICD-10-CM | POA: Diagnosis not present

## 2014-06-17 DIAGNOSIS — Z951 Presence of aortocoronary bypass graft: Secondary | ICD-10-CM | POA: Diagnosis not present

## 2014-06-17 LAB — BASIC METABOLIC PANEL
ANION GAP: 12 (ref 5–15)
BUN: 10 mg/dL (ref 6–23)
CHLORIDE: 105 meq/L (ref 96–112)
CO2: 27 meq/L (ref 19–32)
CREATININE: 1.02 mg/dL (ref 0.50–1.35)
Calcium: 9.6 mg/dL (ref 8.4–10.5)
GFR calc Af Amer: 83 mL/min — ABNORMAL LOW (ref >90)
GFR calc non Af Amer: 71 mL/min — ABNORMAL LOW (ref >90)
GLUCOSE: 134 mg/dL — AB (ref 70–99)
Potassium: 4.1 mEq/L (ref 3.7–5.3)
Sodium: 144 mEq/L (ref 137–147)

## 2014-06-17 LAB — PROTIME-INR
INR: 1.16 (ref 0.00–1.49)
Prothrombin Time: 14.8 seconds (ref 11.6–15.2)

## 2014-06-17 LAB — APTT: aPTT: 33 seconds (ref 24–37)

## 2014-06-18 ENCOUNTER — Encounter (HOSPITAL_BASED_OUTPATIENT_CLINIC_OR_DEPARTMENT_OTHER): Payer: Self-pay | Admitting: Certified Registered"

## 2014-06-18 ENCOUNTER — Encounter (HOSPITAL_BASED_OUTPATIENT_CLINIC_OR_DEPARTMENT_OTHER)
Admission: RE | Disposition: A | Discharge status: Home / Self Care | Payer: Self-pay | Source: Ambulatory Visit | Attending: Plastic Surgery

## 2014-06-18 ENCOUNTER — Ambulatory Visit (HOSPITAL_BASED_OUTPATIENT_CLINIC_OR_DEPARTMENT_OTHER)
Admission: RE | Admit: 2014-06-18 | Discharge: 2014-06-18 | Disposition: A | Discharge status: Home / Self Care | Payer: Medicare Other | Source: Ambulatory Visit | Attending: Plastic Surgery | Admitting: Plastic Surgery

## 2014-06-18 ENCOUNTER — Ambulatory Visit (HOSPITAL_BASED_OUTPATIENT_CLINIC_OR_DEPARTMENT_OTHER): Payer: Medicare Other | Admitting: Certified Registered"

## 2014-06-18 ENCOUNTER — Encounter (HOSPITAL_BASED_OUTPATIENT_CLINIC_OR_DEPARTMENT_OTHER): Payer: Medicare Other | Admitting: Certified Registered"

## 2014-06-18 DIAGNOSIS — L988 Other specified disorders of the skin and subcutaneous tissue: Secondary | ICD-10-CM | POA: Diagnosis not present

## 2014-06-18 DIAGNOSIS — Z7901 Long term (current) use of anticoagulants: Secondary | ICD-10-CM | POA: Insufficient documentation

## 2014-06-18 DIAGNOSIS — I10 Essential (primary) hypertension: Secondary | ICD-10-CM | POA: Diagnosis not present

## 2014-06-18 DIAGNOSIS — E785 Hyperlipidemia, unspecified: Secondary | ICD-10-CM | POA: Insufficient documentation

## 2014-06-18 DIAGNOSIS — Z9889 Other specified postprocedural states: Secondary | ICD-10-CM

## 2014-06-18 DIAGNOSIS — C4339 Malignant melanoma of other parts of face: Secondary | ICD-10-CM | POA: Diagnosis not present

## 2014-06-18 DIAGNOSIS — I480 Paroxysmal atrial fibrillation: Secondary | ICD-10-CM | POA: Insufficient documentation

## 2014-06-18 DIAGNOSIS — Z87891 Personal history of nicotine dependence: Secondary | ICD-10-CM | POA: Insufficient documentation

## 2014-06-18 DIAGNOSIS — E119 Type 2 diabetes mellitus without complications: Secondary | ICD-10-CM | POA: Insufficient documentation

## 2014-06-18 DIAGNOSIS — Z951 Presence of aortocoronary bypass graft: Secondary | ICD-10-CM | POA: Insufficient documentation

## 2014-06-18 DIAGNOSIS — I252 Old myocardial infarction: Secondary | ICD-10-CM | POA: Insufficient documentation

## 2014-06-18 DIAGNOSIS — M952 Other acquired deformity of head: Secondary | ICD-10-CM

## 2014-06-18 DIAGNOSIS — Z888 Allergy status to other drugs, medicaments and biological substances status: Secondary | ICD-10-CM | POA: Insufficient documentation

## 2014-06-18 DIAGNOSIS — M1990 Unspecified osteoarthritis, unspecified site: Secondary | ICD-10-CM | POA: Insufficient documentation

## 2014-06-18 DIAGNOSIS — I2510 Atherosclerotic heart disease of native coronary artery without angina pectoris: Secondary | ICD-10-CM | POA: Diagnosis not present

## 2014-06-18 HISTORY — DX: Presence of spectacles and contact lenses: Z97.3

## 2014-06-18 HISTORY — DX: Complete loss of teeth, unspecified cause, unspecified class: K08.109

## 2014-06-18 HISTORY — DX: Presence of dental prosthetic device (complete) (partial): Z97.2

## 2014-06-18 HISTORY — PX: DEBRIDEMENT AND CLOSURE WOUND: SHX5614

## 2014-06-18 LAB — POCT HEMOGLOBIN-HEMACUE: HEMOGLOBIN: 14 g/dL (ref 13.0–17.0)

## 2014-06-18 LAB — GLUCOSE, CAPILLARY
GLUCOSE-CAPILLARY: 103 mg/dL — AB (ref 70–99)
Glucose-Capillary: 155 mg/dL — ABNORMAL HIGH (ref 70–99)

## 2014-06-18 SURGERY — DEBRIDEMENT, WOUND, WITH CLOSURE
Anesthesia: General | Site: Face | Laterality: Left

## 2014-06-18 MED ORDER — EPHEDRINE SULFATE 50 MG/ML IJ SOLN
INTRAMUSCULAR | Status: DC | PRN
  Administered 2014-06-18 (×3): 5 mg via INTRAVENOUS
  Administered 2014-06-18: 10 mg via INTRAVENOUS

## 2014-06-18 MED ORDER — PROPOFOL 10 MG/ML IV BOLUS
INTRAVENOUS | Status: AC
  Filled 2014-06-18: qty 20

## 2014-06-18 MED ORDER — ONDANSETRON HCL 4 MG/2ML IJ SOLN
INTRAMUSCULAR | Status: DC | PRN
  Administered 2014-06-18: 4 mg via INTRAVENOUS

## 2014-06-18 MED ORDER — DEXAMETHASONE SODIUM PHOSPHATE 4 MG/ML IJ SOLN
INTRAMUSCULAR | Status: DC | PRN
  Administered 2014-06-18: 4 mg via INTRAVENOUS

## 2014-06-18 MED ORDER — FENTANYL CITRATE 0.05 MG/ML IJ SOLN
INTRAMUSCULAR | Status: DC | PRN
  Administered 2014-06-18: 25 ug via INTRAVENOUS
  Administered 2014-06-18: 100 ug via INTRAVENOUS
  Administered 2014-06-18: 25 ug via INTRAVENOUS

## 2014-06-18 MED ORDER — LIDOCAINE HCL (CARDIAC) 20 MG/ML IV SOLN
INTRAVENOUS | Status: DC | PRN
  Administered 2014-06-18: 60 mg via INTRAVENOUS

## 2014-06-18 MED ORDER — CIPROFLOXACIN IN D5W 400 MG/200ML IV SOLN
INTRAVENOUS | Status: AC
  Filled 2014-06-18: qty 200

## 2014-06-18 MED ORDER — MIDAZOLAM HCL 2 MG/2ML IJ SOLN: 1.0000 mg | INTRAMUSCULAR | Status: DC | PRN

## 2014-06-18 MED ORDER — LIDOCAINE-EPINEPHRINE 1 %-1:100000 IJ SOLN
INTRAMUSCULAR | Status: AC
  Filled 2014-06-18: qty 1

## 2014-06-18 MED ORDER — LACTATED RINGERS IV SOLN
INTRAVENOUS | Status: DC
  Administered 2014-06-18 (×2): via INTRAVENOUS

## 2014-06-18 MED ORDER — BACITRACIN ZINC 500 UNIT/GM EX OINT
TOPICAL_OINTMENT | CUTANEOUS | Status: AC
  Filled 2014-06-18: qty 28.35

## 2014-06-18 MED ORDER — FENTANYL CITRATE 0.05 MG/ML IJ SOLN
INTRAMUSCULAR | Status: AC
  Filled 2014-06-18: qty 4

## 2014-06-18 MED ORDER — BUPIVACAINE-EPINEPHRINE (PF) 0.25% -1:200000 IJ SOLN
INTRAMUSCULAR | Status: AC
  Filled 2014-06-18: qty 30

## 2014-06-18 MED ORDER — MIDAZOLAM HCL 2 MG/2ML IJ SOLN
INTRAMUSCULAR | Status: AC
  Filled 2014-06-18: qty 2

## 2014-06-18 MED ORDER — SODIUM CHLORIDE 0.9 % IJ SOLN
INTRAMUSCULAR | Status: AC
  Filled 2014-06-18: qty 10

## 2014-06-18 MED ORDER — ONDANSETRON HCL 4 MG/2ML IJ SOLN: 4.0000 mg | Freq: Once | INTRAMUSCULAR | Status: DC | PRN

## 2014-06-18 MED ORDER — FENTANYL CITRATE 0.05 MG/ML IJ SOLN: 50.0000 ug | INTRAMUSCULAR | Status: DC | PRN

## 2014-06-18 MED ORDER — FENTANYL CITRATE 0.05 MG/ML IJ SOLN: 25.0000 ug | INTRAMUSCULAR | Status: DC | PRN

## 2014-06-18 MED ORDER — LIDOCAINE-EPINEPHRINE 1 %-1:100000 IJ SOLN
INTRAMUSCULAR | Status: DC | PRN
  Administered 2014-06-18: 15 mL

## 2014-06-18 MED ORDER — CIPROFLOXACIN IN D5W 400 MG/200ML IV SOLN
INTRAVENOUS | Status: DC | PRN
  Administered 2014-06-18: 400 mg via INTRAVENOUS

## 2014-06-18 MED ORDER — PROPOFOL 10 MG/ML IV BOLUS
INTRAVENOUS | Status: DC | PRN
  Administered 2014-06-18: 200 mg via INTRAVENOUS

## 2014-06-18 MED ORDER — SODIUM BICARBONATE 4 % IV SOLN
INTRAVENOUS | Status: AC
  Filled 2014-06-18: qty 5

## 2014-06-18 MED ORDER — CIPROFLOXACIN IN D5W 400 MG/200ML IV SOLN
400.0000 mg | INTRAVENOUS | Status: AC
  Administered 2014-06-18: 400 mg via INTRAVENOUS

## 2014-06-18 MED ORDER — SUCCINYLCHOLINE CHLORIDE 20 MG/ML IJ SOLN
INTRAMUSCULAR | Status: AC
  Filled 2014-06-18: qty 1

## 2014-06-18 SURGICAL SUPPLY — 61 items
BLADE CLIPPER SURG (BLADE) IMPLANT
BLADE HEX COATED 2.75 (ELECTRODE) ×2 IMPLANT
BLADE SURG 10 STRL SS (BLADE) IMPLANT
BLADE SURG 15 STRL LF DISP TIS (BLADE) ×3 IMPLANT
BLADE SURG 15 STRL SS (BLADE) ×3
BNDG GAUZE ELAST 4 BULKY (GAUZE/BANDAGES/DRESSINGS) IMPLANT
COVER BACK TABLE 60X90IN (DRAPES) ×2 IMPLANT
COVER MAYO STAND STRL (DRAPES) ×2 IMPLANT
DECANTER SPIKE VIAL GLASS SM (MISCELLANEOUS) IMPLANT
DERMABOND ADVANCED (GAUZE/BANDAGES/DRESSINGS) ×1
DERMABOND ADVANCED .7 DNX12 (GAUZE/BANDAGES/DRESSINGS) ×1 IMPLANT
DRAIN CHANNEL 19F RND (DRAIN) IMPLANT
DRAPE INCISE IOBAN 66X45 STRL (DRAPES) IMPLANT
DRAPE PED LAPAROTOMY (DRAPES) IMPLANT
DRAPE SURG 17X23 STRL (DRAPES) IMPLANT
DRAPE U-SHAPE 76X120 STRL (DRAPES) ×2 IMPLANT
DRSG ADAPTIC 3X8 NADH LF (GAUZE/BANDAGES/DRESSINGS) IMPLANT
DRSG EMULSION OIL 3X3 NADH (GAUZE/BANDAGES/DRESSINGS) IMPLANT
DRSG PAD ABDOMINAL 8X10 ST (GAUZE/BANDAGES/DRESSINGS) IMPLANT
DRSG TEGADERM 4X10 (GAUZE/BANDAGES/DRESSINGS) IMPLANT
DRSG TELFA 3X8 NADH (GAUZE/BANDAGES/DRESSINGS) IMPLANT
ELECT NEEDLE BLADE 2-5/6 (NEEDLE) ×2 IMPLANT
ELECT REM PT RETURN 9FT ADLT (ELECTROSURGICAL) ×2
ELECTRODE REM PT RTRN 9FT ADLT (ELECTROSURGICAL) ×1 IMPLANT
EVACUATOR SILICONE 100CC (DRAIN) IMPLANT
GAUZE SPONGE 4X4 12PLY STRL (GAUZE/BANDAGES/DRESSINGS) IMPLANT
GAUZE XEROFORM 5X9 LF (GAUZE/BANDAGES/DRESSINGS) IMPLANT
GLOVE BIO SURGEON STRL SZ 6.5 (GLOVE) ×6 IMPLANT
GLOVE BIO SURGEON STRL SZ7 (GLOVE) ×2 IMPLANT
GLOVE BIOGEL PI IND STRL 7.5 (GLOVE) ×1 IMPLANT
GLOVE BIOGEL PI INDICATOR 7.5 (GLOVE) ×1
GOWN STRL REUS W/ TWL LRG LVL3 (GOWN DISPOSABLE) ×3 IMPLANT
GOWN STRL REUS W/TWL LRG LVL3 (GOWN DISPOSABLE) ×3
NEEDLE HYPO 25X1 1.5 SAFETY (NEEDLE) ×2 IMPLANT
NS IRRIG 1000ML POUR BTL (IV SOLUTION) ×2 IMPLANT
PACK BASIN DAY SURGERY FS (CUSTOM PROCEDURE TRAY) ×2 IMPLANT
PENCIL BUTTON HOLSTER BLD 10FT (ELECTRODE) ×2 IMPLANT
SHEET MEDIUM DRAPE 40X70 STRL (DRAPES) IMPLANT
SLEEVE SCD COMPRESS KNEE MED (MISCELLANEOUS) ×2 IMPLANT
SPONGE LAP 18X18 X RAY DECT (DISPOSABLE) ×2 IMPLANT
STAPLER VISISTAT 35W (STAPLE) IMPLANT
SUT MNCRL 6-0 UNDY P1 1X18 (SUTURE) ×3 IMPLANT
SUT MNCRL AB 3-0 PS2 18 (SUTURE) IMPLANT
SUT MNCRL AB 4-0 PS2 18 (SUTURE) IMPLANT
SUT MON AB 5-0 P3 18 (SUTURE) ×4 IMPLANT
SUT MONOCRYL 6-0 P1 1X18 (SUTURE) ×3
SUT SILK 3 0 PS 1 (SUTURE) IMPLANT
SUT VIC AB 4-0 P-3 18XBRD (SUTURE) ×2 IMPLANT
SUT VIC AB 4-0 P3 18 (SUTURE) ×2
SUT VIC AB 5-0 P-3 18X BRD (SUTURE) ×2 IMPLANT
SUT VIC AB 5-0 P3 18 (SUTURE) ×2
SUT VICRYL RAPID 5 0 P 3 (SUTURE) IMPLANT
SWAB COLLECTION DEVICE MRSA (MISCELLANEOUS) IMPLANT
SYR BULB IRRIGATION 50ML (SYRINGE) IMPLANT
SYR CONTROL 10ML LL (SYRINGE) ×2 IMPLANT
TOWEL OR 17X24 6PK STRL BLUE (TOWEL DISPOSABLE) ×4 IMPLANT
TRAY DSU PREP LF (CUSTOM PROCEDURE TRAY) ×2 IMPLANT
TUBE ANAEROBIC SPECIMEN COL (MISCELLANEOUS) IMPLANT
TUBE CONNECTING 20X1/4 (TUBING) IMPLANT
UNDERPAD 30X30 INCONTINENT (UNDERPADS AND DIAPERS) IMPLANT
YANKAUER SUCT BULB TIP NO VENT (SUCTIONS) IMPLANT

## 2014-06-18 NOTE — Op Note (Signed)
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   COMPLICATIONS: None.   INDICATIONS FOR PROCEDURE:  The patient, [name] is a 73 y.o. male born on 09-Mar-1941, is here for treatment of left cheek defect after excision of melanoma by mohs technique. MRN: [id number]  CONSENT:  Informed consent was obtained directly from the patient. Risks, benefits and alternatives were fully discussed. Specific risks including but not limited to bleeding, infection, hematoma, seroma, scarring, pain, infection, contracture, asymmetry, wound healing problems, and need for further surgery were all discussed. The patient did have an ample opportunity to have questions answered to satisfaction.   DESCRIPTION OF PROCEDURE:  The patient was taken to the operating room. SCDs were placed and IV antibiotics were given. The patient's operative site was prepped and draped in a sterile fashion. A time out was performed and all information was confirmed to be correct.  General anesthesia was administered.  The local was injected into the left cheek the medial and lateral flaps were lifted in the face lift type plan.  The SMAS was plicated to decrease the tension using 4-0 Vicryl.  The lateral flap was cut at the periorbital area laterally to create a rotation flap that was rotated medially and inferiorly.  The skin was closed with deep 5-0 Monocryl and then running 5-0 monocryl for the skin.  dermabond was applied.  The patient tolerated the procedure well.   There were no complications. The patient was allowed to wake from anesthesia, extubated and  taken to the recovery room in satisfactory condition.

## 2014-06-18 NOTE — Brief Op Note (Signed)
06/18/2014  12:05 PM  PATIENT:  [name]  73 y.o. male  PRE-OPERATIVE DIAGNOSIS:  CANCER OF LEFT CHEEK  POST-OPERATIVE DIAGNOSIS:  CANCER OF LEFT CHEEK  PROCEDURE:  Procedure(s): CLOSURE OF OPEN WOUND OF LEFT CHEEK/POSSIBLE CHEEK ADVANCEMENT/ROTATION (Left)  SURGEON:  Surgeon(s) and Role:    * Claire Sanger, DO - Primary  PHYSICIAN ASSISTANT: Shawn Rayburn, PA  ASSISTANTS: none   ANESTHESIA:   local and general  EBL:  Total I/O In: 1000 [I.V.:1000] Out: -   BLOOD ADMINISTERED:none  DRAINS: none   LOCAL MEDICATIONS USED:  LIDOCAINE   SPECIMEN:  No Specimen  DISPOSITION OF SPECIMEN:  N/A  COUNTS:  YES  TOURNIQUET:  * No tourniquets in log *  DICTATION: .Dragon Dictation  PLAN OF CARE: Discharge to home after PACU  PATIENT DISPOSITION:  PACU - hemodynamically stable.   Delay start of Pharmacological VTE agent (>24hrs) due to surgical blood loss or risk of bleeding: no

## 2014-06-18 NOTE — Transfer of Care (Signed)
Immediate Anesthesia Transfer of Care Note  Patient: [name]  Procedure(s) Performed: Procedure(s): CLOSURE OF OPEN WOUND OF LEFT CHEEK/POSSIBLE CHEEK ADVANCEMENT/ROTATION (Left)  Patient Location: PACU  Anesthesia Type:General  Level of Consciousness: awake, sedated and patient cooperative  Airway & Oxygen Therapy: Patient Spontanous Breathing and Patient connected to face mask oxygen  Post-op Assessment: Report given to PACU RN and Post -op Vital signs reviewed and stable  Post vital signs: Reviewed and stable  Complications: No apparent anesthesia complications

## 2014-06-18 NOTE — H&P (Signed)
[name] is an 73 y.o. male.   Chief Complaint: left cheek wound HPI: The patient is a 73 yrs old wm here for treatment of his left cheek wound.  He underwent mohs excision of a skin cancer.  He presents for closure.  He has multiple medical conditions but they are stable at this time.  The area is ~ 3 x 4 cm and to the fat pad of his cheek.  His motor function of his cheek is intact.  He has no other complaints.  Past Medical History  Diagnosis Date  . Coronary artery disease   . Hypertension   . Diabetes mellitus   . Paroxysmal atrial fibrillation   . Current use of long term anticoagulation   . Dyslipidemia   . Edema   . Hyperlipidemia   . Gout   . History of acute myocardial infarction 11/2005    acute anteroseptal myocardial infarction  . Osteoarthritis   . Chest pain July 2012    s/p repeat cath; stable anatomy. Managed medically  . Wears glasses   . Full dentures     Past Surgical History  Procedure Laterality Date  . Coronary artery bypass graft  2005  . Cardiac catheterization  05/18/2009  . Cardiac catheterization  July 2012    Stable anatomy. Manage medically  . Tonsillectomy    . Colonoscopy      Family History  Problem Relation Age of Onset  . Coronary artery disease Mother   . Heart disease Mother   . Coronary artery disease Father   . Heart disease Father   . Cancer Brother   . Cancer Brother    Social History:  reports that he quit smoking about 31 years ago. His smoking use included Cigarettes. He smoked 0.00 packs per day. He has never used smokeless tobacco. He reports that he does not drink alcohol or use illicit drugs.  Allergies:  Allergies  Allergen Reactions  . Cephalexin Rash    Medications Prior to Admission  Medication Sig Dispense Refill  . acetaminophen (TYLENOL) 500 MG tablet Take 1,000 mg by mouth daily.       Marland Kitchen allopurinol (ZYLOPRIM) 300 MG tablet Take 300 mg by mouth daily.      Marland Kitchen amLODipine-atorvastatin (CADUET) 10-40 MG  per tablet TAKE 1 TABLET BY MOUTH DAILY.  30 tablet  1  . Ascorbic Acid (VITAMIN C) 500 MG tablet Take 1,000 mg by mouth daily.       Marland Kitchen b complex vitamins tablet Take 1 tablet by mouth daily.       . cloNIDine (CATAPRES) 0.1 MG tablet TAKE 1 TABLET BY MOUTH IN THE MORNING AND 2 TABLETS BY MOUTH IN THE EVENING  90 tablet  2  . fish oil-omega-3 fatty acids 1000 MG capsule Take 1 g by mouth 2 (two) times daily.       . furosemide (LASIX) 20 MG tablet Take 1 tablet (20 mg total) by mouth daily.  30 tablet  6  . GLIPIZIDE PO Take 5 mg by mouth daily.       . isosorbide mononitrate (IMDUR) 30 MG 24 hr tablet TAKE 1 TABLET DAILY  90 tablet  1  . Lancets (ONETOUCH ULTRASOFT) lancets       . lisinopril (PRINIVIL,ZESTRIL) 40 MG tablet TAKE 1 TABLET (40 MG TOTAL) BY MOUTH DAILY.  30 tablet  1  . metFORMIN (GLUCOPHAGE-XR) 500 MG 24 hr tablet Take 500 mg by mouth 2 (two) times daily with a meal.      .  metoprolol succinate (TOPROL-XL) 100 MG 24 hr tablet TAKE ONE TABLET BY MOUTH DAILY  30 tablet  2  . Multiple Vitamin (MULTIVITAMIN WITH MINERALS) TABS tablet Take 1 tablet by mouth daily.      . ONE TOUCH ULTRA TEST test strip       . warfarin (COUMADIN) 5 MG tablet TAKE 1 TABLET (5 MG TOTAL) BY MOUTH AS DIRECTED.  100 tablet  1  . cloNIDine (CATAPRES) 0.1 MG tablet Take 0.1 mg by mouth 3 (three) times daily.      Marland Kitchen NITROSTAT 0.4 MG SL tablet PLACE 1 TABLET (0.4 MG TOTAL) UNDER THE TONGUE EVERY 5  MINUTES AS NEEDED FOR CHEST PAIN.  100 tablet  1    Results for orders placed during the hospital encounter of 06/18/14 (from the past 48 hour(s))  BASIC METABOLIC PANEL     Status: Abnormal   Collection Time    06/17/14 11:45 AM      Result Value Ref Range   Sodium 144  137 - 147 mEq/L   Potassium 4.1  3.7 - 5.3 mEq/L   Chloride 105  96 - 112 mEq/L   CO2 27  19 - 32 mEq/L   Glucose, Bld 134 (*) 70 - 99 mg/dL   BUN 10  6 - 23 mg/dL   Creatinine, Ser 1.02  0.50 - 1.35 mg/dL   Calcium 9.6  8.4 - 10.5 mg/dL    GFR calc non Af Amer 71 (*) >90 mL/min   GFR calc Af Amer 83 (*) >90 mL/min   Comment: (NOTE)     The eGFR has been calculated using the CKD EPI equation.     This calculation has not been validated in all clinical situations.     eGFR's persistently <90 mL/min signify possible Chronic Kidney     Disease.   Anion gap 12  5 - 15  APTT     Status: None   Collection Time    06/17/14 11:45 AM      Result Value Ref Range   aPTT 33  24 - 37 seconds  PROTIME-INR     Status: None   Collection Time    06/17/14 11:45 AM      Result Value Ref Range   Prothrombin Time 14.8  11.6 - 15.2 seconds   INR 1.16  0.00 - 1.49   No results found.  Review of Systems  Constitutional: Negative.   HENT: Negative.   Eyes: Negative.   Respiratory: Negative.   Cardiovascular: Negative.   Gastrointestinal: Negative.   Genitourinary: Negative.   Musculoskeletal: Negative.   Skin: Negative.   Neurological: Negative.   Psychiatric/Behavioral: Negative.     Blood pressure 155/64, pulse 62, temperature 97.7 F (36.5 C), temperature source Oral, resp. rate 18, height $RemoveBe'5\' 9"'QTLObFbsx$  (1.753 m), weight 97.693 kg (215 lb 6 oz), SpO2 98.00%. Physical Exam  Constitutional: He is oriented to person, place, and time. He appears well-developed and well-nourished.  HENT:  Head: Normocephalic and atraumatic.  Eyes: Conjunctivae and EOM are normal. Pupils are equal, round, and reactive to light.  Cardiovascular: Normal rate.   Respiratory: Effort normal.  GI: Soft.  Neurological: He is alert and oriented to person, place, and time.  Skin: Skin is warm.  Psychiatric: He has a normal mood and affect. His behavior is normal. Judgment and thought content normal.     Assessment/Plan Plan to close the defect with rotation, advancement and primary closure as able.  Risks and complications were  discussed and the patient consented to proceed.  [place] 06/18/2014, 8:28 AM

## 2014-06-18 NOTE — Anesthesia Postprocedure Evaluation (Signed)
  Anesthesia Post-op Note  Patient: [name]  Procedure(s) Performed: Procedure(s): CLOSURE OF OPEN WOUND OF LEFT CHEEK/POSSIBLE CHEEK ADVANCEMENT/ROTATION (Left)  Patient Location: PACU  Anesthesia Type: General   Level of Consciousness: awake, alert  and oriented  Airway and Oxygen Therapy: Patient Spontanous Breathing  Post-op Pain: mild  Post-op Assessment: Post-op Vital signs reviewed  Post-op Vital Signs: Reviewed  Last Vitals:  Filed Vitals:   06/18/14 1245  BP:   Pulse: 75  Temp:   Resp: 15    Complications: No apparent anesthesia complications

## 2014-06-18 NOTE — Anesthesia Preprocedure Evaluation (Addendum)
Anesthesia Evaluation  Patient identified by MRN, date of birth, ID band Patient awake    Reviewed: Allergy & Precautions, H&P , NPO status , Patient's Chart, lab work & pertinent test results  Airway Mallampati: I TM Distance: >3 FB Neck ROM: Full    Dental  (+) Teeth Intact, Dental Advisory Given   Pulmonary former smoker,  breath sounds clear to auscultation        Cardiovascular hypertension, Pt. on medications + CAD Rhythm:Regular Rate:Normal     Neuro/Psych    GI/Hepatic   Endo/Other  diabetes, Well Controlled, Type 2, Oral Hypoglycemic AgentsMorbid obesity  Renal/GU      Musculoskeletal   Abdominal   Peds  Hematology   Anesthesia Other Findings   Reproductive/Obstetrics                          Anesthesia Physical Anesthesia Plan  ASA: III  Anesthesia Plan: General   Post-op Pain Management:    Induction: Intravenous  Airway Management Planned: Oral ETT and LMA  Additional Equipment:   Intra-op Plan:   Post-operative Plan: Extubation in OR  Informed Consent: I have reviewed the patients History and Physical, chart, labs and discussed the procedure including the risks, benefits and alternatives for the proposed anesthesia with the patient or authorized representative who has indicated his/her understanding and acceptance.   Dental advisory given  Plan Discussed with: CRNA, Anesthesiologist and Surgeon  Anesthesia Plan Comments:        Anesthesia Quick Evaluation

## 2014-06-18 NOTE — Discharge Instructions (Signed)
May shower and wash face tomorrow.   Post Anesthesia Home Care Instructions  Activity: Get plenty of rest for the remainder of the day. A responsible adult should stay with you for 24 hours following the procedure.  For the next 24 hours, DO NOT: -Drive a car -Paediatric nurse -Drink alcoholic beverages -Take any medication unless instructed by your physician -Make any legal decisions or sign important papers.  Meals: Start with liquid foods such as gelatin or soup. Progress to regular foods as tolerated. Avoid greasy, spicy, heavy foods. If nausea and/or vomiting occur, drink only clear liquids until the nausea and/or vomiting subsides. Call your physician if vomiting continues.  Special Instructions/Symptoms: Your throat may feel dry or sore from the anesthesia or the breathing tube placed in your throat during surgery. If this causes discomfort, gargle with warm salt water. The discomfort should disappear within 24 hours.

## 2014-06-18 NOTE — Anesthesia Procedure Notes (Signed)
Procedure Name: LMA Insertion Date/Time: 06/18/2014 10:46 AM Performed by: Baxter Flattery Pre-anesthesia Checklist: Patient identified, Emergency Drugs available, Suction available and Patient being monitored Patient Re-evaluated:Patient Re-evaluated prior to inductionOxygen Delivery Method: Circle System Utilized Preoxygenation: Pre-oxygenation with 100% oxygen Intubation Type: IV induction Ventilation: Mask ventilation without difficulty LMA: LMA flexible inserted LMA Size: 5.0 Number of attempts: 2 Placement Confirmation: positive ETCO2 and breath sounds checked- equal and bilateral Tube secured with: Tape Dental Injury: Teeth and Oropharynx as per pre-operative assessment

## 2014-06-19 ENCOUNTER — Encounter (HOSPITAL_BASED_OUTPATIENT_CLINIC_OR_DEPARTMENT_OTHER): Payer: Self-pay | Admitting: Plastic Surgery

## 2014-06-19 NOTE — Addendum Note (Signed)
Addendum created 06/19/14 0645 by Tawni Millers, CRNA   Modules edited: Charges VN

## 2014-06-25 ENCOUNTER — Ambulatory Visit (INDEPENDENT_AMBULATORY_CARE_PROVIDER_SITE_OTHER): Payer: Medicare Other | Admitting: *Deleted

## 2014-06-25 DIAGNOSIS — I4891 Unspecified atrial fibrillation: Secondary | ICD-10-CM

## 2014-06-25 DIAGNOSIS — Z23 Encounter for immunization: Secondary | ICD-10-CM

## 2014-06-25 DIAGNOSIS — Z5181 Encounter for therapeutic drug level monitoring: Secondary | ICD-10-CM

## 2014-06-25 DIAGNOSIS — Z7901 Long term (current) use of anticoagulants: Secondary | ICD-10-CM

## 2014-06-25 LAB — POCT INR: INR: 1.1

## 2014-06-30 ENCOUNTER — Other Ambulatory Visit: Payer: Self-pay | Admitting: Cardiology

## 2014-07-08 ENCOUNTER — Ambulatory Visit (INDEPENDENT_AMBULATORY_CARE_PROVIDER_SITE_OTHER): Payer: Medicare Other

## 2014-07-08 DIAGNOSIS — I4891 Unspecified atrial fibrillation: Secondary | ICD-10-CM

## 2014-07-08 DIAGNOSIS — Z7901 Long term (current) use of anticoagulants: Secondary | ICD-10-CM

## 2014-07-08 DIAGNOSIS — Z5181 Encounter for therapeutic drug level monitoring: Secondary | ICD-10-CM

## 2014-07-08 LAB — POCT INR: INR: 2

## 2014-07-09 ENCOUNTER — Telehealth: Payer: Self-pay | Admitting: Cardiology

## 2014-07-09 NOTE — Telephone Encounter (Signed)
New problem   Pt need clearance to stop his coumadin for procedure. Please call pt.

## 2014-07-09 NOTE — Telephone Encounter (Signed)
Returned call to patient no answer.LMTC. 

## 2014-07-11 NOTE — Telephone Encounter (Signed)
Returned call to patient he stated he will be having plastic surgery on his face 07/17/14.Stated he was told to hold coumadin 5 days prior to surgery wanted to let Dr.Jordan know.Message sent to [place].

## 2014-07-13 NOTE — Telephone Encounter (Signed)
OK to hold coumadin for 5 days prior to surgery.  Peter Martinique MD, [facility]

## 2014-07-15 ENCOUNTER — Encounter (HOSPITAL_BASED_OUTPATIENT_CLINIC_OR_DEPARTMENT_OTHER): Payer: Self-pay | Admitting: *Deleted

## 2014-07-15 NOTE — Progress Notes (Signed)
Pt was here 10/15-will come in for pt-inr-bmet

## 2014-07-16 ENCOUNTER — Encounter (HOSPITAL_BASED_OUTPATIENT_CLINIC_OR_DEPARTMENT_OTHER)
Admission: RE | Admit: 2014-07-16 | Discharge: 2014-07-16 | Disposition: A | Discharge status: Home / Self Care | Payer: Medicare Other | Source: Ambulatory Visit | Attending: Plastic Surgery | Admitting: Plastic Surgery

## 2014-07-16 DIAGNOSIS — C4339 Malignant melanoma of other parts of face: Secondary | ICD-10-CM | POA: Diagnosis not present

## 2014-07-16 DIAGNOSIS — M1990 Unspecified osteoarthritis, unspecified site: Secondary | ICD-10-CM | POA: Diagnosis not present

## 2014-07-16 DIAGNOSIS — M109 Gout, unspecified: Secondary | ICD-10-CM | POA: Diagnosis not present

## 2014-07-16 DIAGNOSIS — I10 Essential (primary) hypertension: Secondary | ICD-10-CM | POA: Diagnosis not present

## 2014-07-16 DIAGNOSIS — E785 Hyperlipidemia, unspecified: Secondary | ICD-10-CM | POA: Diagnosis not present

## 2014-07-16 DIAGNOSIS — Z888 Allergy status to other drugs, medicaments and biological substances status: Secondary | ICD-10-CM | POA: Diagnosis not present

## 2014-07-16 DIAGNOSIS — I2510 Atherosclerotic heart disease of native coronary artery without angina pectoris: Secondary | ICD-10-CM | POA: Diagnosis not present

## 2014-07-16 DIAGNOSIS — E119 Type 2 diabetes mellitus without complications: Secondary | ICD-10-CM | POA: Diagnosis not present

## 2014-07-16 DIAGNOSIS — I252 Old myocardial infarction: Secondary | ICD-10-CM | POA: Diagnosis not present

## 2014-07-16 DIAGNOSIS — Z7901 Long term (current) use of anticoagulants: Secondary | ICD-10-CM | POA: Diagnosis not present

## 2014-07-16 DIAGNOSIS — Z87891 Personal history of nicotine dependence: Secondary | ICD-10-CM | POA: Diagnosis not present

## 2014-07-16 DIAGNOSIS — I480 Paroxysmal atrial fibrillation: Secondary | ICD-10-CM | POA: Diagnosis not present

## 2014-07-16 LAB — POCT I-STAT, CHEM 8
BUN: 15 mg/dL (ref 6–23)
CHLORIDE: 107 meq/L (ref 96–112)
Calcium, Ion: 1.06 mmol/L — ABNORMAL LOW (ref 1.13–1.30)
Creatinine, Ser: 1 mg/dL (ref 0.50–1.35)
Glucose, Bld: 120 mg/dL — ABNORMAL HIGH (ref 70–99)
HEMATOCRIT: 45 % (ref 39.0–52.0)
Hemoglobin: 15.3 g/dL (ref 13.0–17.0)
POTASSIUM: 4.3 meq/L (ref 3.7–5.3)
SODIUM: 140 meq/L (ref 137–147)
TCO2: 24 mmol/L (ref 0–100)

## 2014-07-16 NOTE — Progress Notes (Signed)
Pt called and asked to come in at 0630 on the day of surgery so another PT PTT INR can be sent.  Sample sent today had insufficient quantity,

## 2014-07-17 ENCOUNTER — Ambulatory Visit (HOSPITAL_BASED_OUTPATIENT_CLINIC_OR_DEPARTMENT_OTHER): Payer: Medicare Other | Admitting: Anesthesiology

## 2014-07-17 ENCOUNTER — Encounter (HOSPITAL_BASED_OUTPATIENT_CLINIC_OR_DEPARTMENT_OTHER)
Admission: RE | Disposition: A | Discharge status: Home / Self Care | Payer: Self-pay | Source: Ambulatory Visit | Attending: Plastic Surgery

## 2014-07-17 ENCOUNTER — Encounter (HOSPITAL_BASED_OUTPATIENT_CLINIC_OR_DEPARTMENT_OTHER): Payer: Self-pay | Admitting: *Deleted

## 2014-07-17 ENCOUNTER — Other Ambulatory Visit: Payer: Self-pay | Admitting: Plastic Surgery

## 2014-07-17 ENCOUNTER — Ambulatory Visit (HOSPITAL_BASED_OUTPATIENT_CLINIC_OR_DEPARTMENT_OTHER)
Admission: RE | Admit: 2014-07-17 | Discharge: 2014-07-17 | Disposition: A | Discharge status: Home / Self Care | Payer: Medicare Other | Source: Ambulatory Visit | Attending: Plastic Surgery | Admitting: Plastic Surgery

## 2014-07-17 DIAGNOSIS — I10 Essential (primary) hypertension: Secondary | ICD-10-CM | POA: Diagnosis not present

## 2014-07-17 DIAGNOSIS — E785 Hyperlipidemia, unspecified: Secondary | ICD-10-CM | POA: Insufficient documentation

## 2014-07-17 DIAGNOSIS — Z7901 Long term (current) use of anticoagulants: Secondary | ICD-10-CM | POA: Insufficient documentation

## 2014-07-17 DIAGNOSIS — E119 Type 2 diabetes mellitus without complications: Secondary | ICD-10-CM | POA: Diagnosis not present

## 2014-07-17 DIAGNOSIS — C4339 Malignant melanoma of other parts of face: Secondary | ICD-10-CM | POA: Insufficient documentation

## 2014-07-17 DIAGNOSIS — M109 Gout, unspecified: Secondary | ICD-10-CM | POA: Insufficient documentation

## 2014-07-17 DIAGNOSIS — I480 Paroxysmal atrial fibrillation: Secondary | ICD-10-CM | POA: Insufficient documentation

## 2014-07-17 DIAGNOSIS — S01402A Unspecified open wound of left cheek and temporomandibular area, initial encounter: Secondary | ICD-10-CM

## 2014-07-17 DIAGNOSIS — I252 Old myocardial infarction: Secondary | ICD-10-CM | POA: Insufficient documentation

## 2014-07-17 DIAGNOSIS — I2510 Atherosclerotic heart disease of native coronary artery without angina pectoris: Secondary | ICD-10-CM | POA: Insufficient documentation

## 2014-07-17 DIAGNOSIS — Z888 Allergy status to other drugs, medicaments and biological substances status: Secondary | ICD-10-CM | POA: Insufficient documentation

## 2014-07-17 DIAGNOSIS — M1990 Unspecified osteoarthritis, unspecified site: Secondary | ICD-10-CM | POA: Insufficient documentation

## 2014-07-17 DIAGNOSIS — Z87891 Personal history of nicotine dependence: Secondary | ICD-10-CM | POA: Insufficient documentation

## 2014-07-17 HISTORY — PX: INCISION AND DRAINAGE OF WOUND: SHX1803

## 2014-07-17 LAB — PROTIME-INR
INR: 1.05 (ref 0.00–1.49)
Prothrombin Time: 13.8 seconds (ref 11.6–15.2)

## 2014-07-17 LAB — APTT: aPTT: 34 seconds (ref 24–37)

## 2014-07-17 LAB — GLUCOSE, CAPILLARY
Glucose-Capillary: 115 mg/dL — ABNORMAL HIGH (ref 70–99)
Glucose-Capillary: 148 mg/dL — ABNORMAL HIGH (ref 70–99)

## 2014-07-17 SURGERY — IRRIGATION AND DEBRIDEMENT WOUND
Anesthesia: General | Site: Face | Laterality: Left

## 2014-07-17 MED ORDER — ONDANSETRON HCL 4 MG/2ML IJ SOLN
INTRAMUSCULAR | Status: DC | PRN
  Administered 2014-07-17: 4 mg via INTRAVENOUS

## 2014-07-17 MED ORDER — FENTANYL CITRATE 0.05 MG/ML IJ SOLN: 25.0000 ug | INTRAMUSCULAR | Status: DC | PRN

## 2014-07-17 MED ORDER — BUPIVACAINE-EPINEPHRINE (PF) 0.25% -1:200000 IJ SOLN
INTRAMUSCULAR | Status: AC
  Filled 2014-07-17: qty 30

## 2014-07-17 MED ORDER — CIPROFLOXACIN IN D5W 400 MG/200ML IV SOLN
INTRAVENOUS | Status: DC | PRN
  Administered 2014-07-17: 400 mg via INTRAVENOUS

## 2014-07-17 MED ORDER — FENTANYL CITRATE 0.05 MG/ML IJ SOLN: 50.0000 ug | INTRAMUSCULAR | Status: DC | PRN

## 2014-07-17 MED ORDER — LACTATED RINGERS IV SOLN
INTRAVENOUS | Status: DC
  Administered 2014-07-17: 07:00:00 via INTRAVENOUS

## 2014-07-17 MED ORDER — LIDOCAINE-EPINEPHRINE 1 %-1:100000 IJ SOLN
INTRAMUSCULAR | Status: AC
  Filled 2014-07-17: qty 1

## 2014-07-17 MED ORDER — PROPOFOL 10 MG/ML IV BOLUS
INTRAVENOUS | Status: DC | PRN
  Administered 2014-07-17: 200 mg via INTRAVENOUS

## 2014-07-17 MED ORDER — FENTANYL CITRATE 0.05 MG/ML IJ SOLN
INTRAMUSCULAR | Status: DC | PRN
  Administered 2014-07-17: 50 ug via INTRAVENOUS

## 2014-07-17 MED ORDER — BACITRACIN ZINC 500 UNIT/GM EX OINT
TOPICAL_OINTMENT | CUTANEOUS | Status: AC
  Filled 2014-07-17: qty 28.35

## 2014-07-17 MED ORDER — CIPROFLOXACIN IN D5W 400 MG/200ML IV SOLN
INTRAVENOUS | Status: AC
  Filled 2014-07-17: qty 200

## 2014-07-17 MED ORDER — MIDAZOLAM HCL 2 MG/2ML IJ SOLN: 1.0000 mg | INTRAMUSCULAR | Status: DC | PRN

## 2014-07-17 MED ORDER — FENTANYL CITRATE 0.05 MG/ML IJ SOLN
INTRAMUSCULAR | Status: AC
  Filled 2014-07-17: qty 6

## 2014-07-17 MED ORDER — OXYCODONE HCL 5 MG PO TABS: 5.0000 mg | ORAL_TABLET | Freq: Once | ORAL | Status: DC | PRN

## 2014-07-17 MED ORDER — TRAMADOL HCL 50 MG PO TABS: 50.0000 mg | ORAL_TABLET | Freq: Four times a day (QID) | ORAL | Status: DC | PRN

## 2014-07-17 MED ORDER — ONDANSETRON HCL 4 MG/2ML IJ SOLN: 4.0000 mg | Freq: Four times a day (QID) | INTRAMUSCULAR | Status: DC | PRN

## 2014-07-17 MED ORDER — MIDAZOLAM HCL 2 MG/2ML IJ SOLN
INTRAMUSCULAR | Status: AC
  Filled 2014-07-17: qty 2

## 2014-07-17 MED ORDER — OXYCODONE HCL 5 MG/5ML PO SOLN: 5.0000 mg | Freq: Once | ORAL | Status: DC | PRN

## 2014-07-17 SURGICAL SUPPLY — 69 items
BAG DECANTER FOR FLEXI CONT (MISCELLANEOUS) IMPLANT
BENZOIN TINCTURE PRP APPL 2/3 (GAUZE/BANDAGES/DRESSINGS) IMPLANT
BLADE HEX COATED 2.75 (ELECTRODE) IMPLANT
BLADE SURG 10 STRL SS (BLADE) IMPLANT
BLADE SURG 15 STRL LF DISP TIS (BLADE) ×1 IMPLANT
BLADE SURG 15 STRL SS (BLADE) ×1
CANISTER SUCT 1200ML W/VALVE (MISCELLANEOUS) IMPLANT
CANISTER SUCT 3000ML (MISCELLANEOUS) IMPLANT
CHLORAPREP W/TINT 26ML (MISCELLANEOUS) IMPLANT
COVER BACK TABLE 60X90IN (DRAPES) ×2 IMPLANT
COVER MAYO STAND STRL (DRAPES) ×2 IMPLANT
DECANTER SPIKE VIAL GLASS SM (MISCELLANEOUS) IMPLANT
DRAIN CHANNEL 19F RND (DRAIN) IMPLANT
DRAIN PENROSE 1/2X12 LTX STRL (WOUND CARE) IMPLANT
DRAPE INCISE IOBAN 66X45 STRL (DRAPES) IMPLANT
DRAPE LAPAROSCOPIC ABDOMINAL (DRAPES) IMPLANT
DRAPE PED LAPAROTOMY (DRAPES) IMPLANT
DRSG ADAPTIC 3X8 NADH LF (GAUZE/BANDAGES/DRESSINGS) IMPLANT
DRSG EMULSION OIL 3X3 NADH (GAUZE/BANDAGES/DRESSINGS) ×2 IMPLANT
DRSG PAD ABDOMINAL 8X10 ST (GAUZE/BANDAGES/DRESSINGS) IMPLANT
ELECT REM PT RETURN 9FT ADLT (ELECTROSURGICAL) ×2
ELECTRODE REM PT RTRN 9FT ADLT (ELECTROSURGICAL) ×1 IMPLANT
EVACUATOR SILICONE 100CC (DRAIN) IMPLANT
GAUZE SPONGE 4X4 12PLY STRL (GAUZE/BANDAGES/DRESSINGS) ×2 IMPLANT
GAUZE XEROFORM 1X8 LF (GAUZE/BANDAGES/DRESSINGS) IMPLANT
GAUZE XEROFORM 5X9 LF (GAUZE/BANDAGES/DRESSINGS) IMPLANT
GLOVE BIO SURGEON STRL SZ 6.5 (GLOVE) ×4 IMPLANT
GLOVE BIOGEL PI IND STRL 7.0 (GLOVE) ×1 IMPLANT
GLOVE BIOGEL PI INDICATOR 7.0 (GLOVE) ×1
GLOVE ECLIPSE 6.5 STRL STRAW (GLOVE) ×2 IMPLANT
GOWN STRL REUS W/ TWL LRG LVL3 (GOWN DISPOSABLE) ×3 IMPLANT
GOWN STRL REUS W/TWL LRG LVL3 (GOWN DISPOSABLE) ×3
IV NS IRRIG 3000ML ARTHROMATIC (IV SOLUTION) IMPLANT
MANIFOLD NEPTUNE II (INSTRUMENTS) IMPLANT
MATRIX SURGICAL PSMX 7X10CM (Tissue) ×2 IMPLANT
MICROMATRIX 500MG (Tissue) ×2 IMPLANT
NEEDLE HYPO 25X1 1.5 SAFETY (NEEDLE) IMPLANT
NS IRRIG 1000ML POUR BTL (IV SOLUTION) ×2 IMPLANT
PACK BASIN DAY SURGERY FS (CUSTOM PROCEDURE TRAY) ×2 IMPLANT
PENCIL BUTTON HOLSTER BLD 10FT (ELECTRODE) ×2 IMPLANT
PIN SAFETY STERILE (MISCELLANEOUS) IMPLANT
SHEET MEDIUM DRAPE 40X70 STRL (DRAPES) IMPLANT
SLEEVE SCD COMPRESS KNEE MED (MISCELLANEOUS) IMPLANT
SOLUTION PARTIC MCRMTRX 500MG (Tissue) ×1 IMPLANT
SPONGE GAUZE 2X2 8PLY STRL LF (GAUZE/BANDAGES/DRESSINGS) ×2 IMPLANT
SPONGE GAUZE 4X4 12PLY STER LF (GAUZE/BANDAGES/DRESSINGS) IMPLANT
SPONGE LAP 18X18 X RAY DECT (DISPOSABLE) IMPLANT
STAPLER VISISTAT 35W (STAPLE) IMPLANT
STRIP CLOSURE SKIN 1/2X4 (GAUZE/BANDAGES/DRESSINGS) IMPLANT
SUCTION FRAZIER TIP 10 FR DISP (SUCTIONS) IMPLANT
SURGILUBE 2OZ TUBE FLIPTOP (MISCELLANEOUS) ×2 IMPLANT
SUT MNCRL AB 4-0 PS2 18 (SUTURE) IMPLANT
SUT MON AB 3-0 SH 27 (SUTURE)
SUT MON AB 3-0 SH27 (SUTURE) IMPLANT
SUT MON AB 5-0 PS2 18 (SUTURE) ×2 IMPLANT
SUT SILK 3 0 PS 1 (SUTURE) IMPLANT
SUT VIC AB 3-0 FS2 27 (SUTURE) IMPLANT
SUT VIC AB 5-0 PS2 18 (SUTURE) IMPLANT
SUT VICRYL 4-0 PS2 18IN ABS (SUTURE) IMPLANT
SWAB COLLECTION DEVICE MRSA (MISCELLANEOUS) IMPLANT
SYR BULB IRRIGATION 50ML (SYRINGE) IMPLANT
SYR CONTROL 10ML LL (SYRINGE) IMPLANT
TAPE HYPAFIX 6X30 (GAUZE/BANDAGES/DRESSINGS) IMPLANT
TOWEL OR 17X24 6PK STRL BLUE (TOWEL DISPOSABLE) ×2 IMPLANT
TRAY DSU PREP LF (CUSTOM PROCEDURE TRAY) IMPLANT
TUBE ANAEROBIC SPECIMEN COL (MISCELLANEOUS) IMPLANT
TUBE CONNECTING 20X1/4 (TUBING) IMPLANT
UNDERPAD 30X30 INCONTINENT (UNDERPADS AND DIAPERS) IMPLANT
YANKAUER SUCT BULB TIP NO VENT (SUCTIONS) IMPLANT

## 2014-07-17 NOTE — Anesthesia Procedure Notes (Signed)
Procedure Name: LMA Insertion Date/Time: 07/17/2014 8:45 AM Performed by: Melynda Ripple D Pre-anesthesia Checklist: Patient identified, Emergency Drugs available, Suction available and Patient being monitored Patient Re-evaluated:Patient Re-evaluated prior to inductionOxygen Delivery Method: Circle System Utilized Preoxygenation: Pre-oxygenation with 100% oxygen Intubation Type: IV induction Ventilation: Mask ventilation without difficulty LMA: LMA inserted LMA Size: 5.0 Number of attempts: 1 Airway Equipment and Method: bite block Placement Confirmation: positive ETCO2 Tube secured with: Tape Dental Injury: Teeth and Oropharynx as per pre-operative assessment

## 2014-07-17 NOTE — Addendum Note (Signed)
Addendum  created 07/17/14 1205 by Karsyn Jamie W Tambra Muller, CRNA   Modules edited: Anesthesia Responsible Staff

## 2014-07-17 NOTE — Discharge Instructions (Signed)
Apply ky gel to the wound daily    Post Anesthesia Home Care Instructions  Activity: Get plenty of rest for the remainder of the day. A responsible adult should stay with you for 24 hours following the procedure.  For the next 24 hours, DO NOT: -Drive a car -Paediatric nurse -Drink alcoholic beverages -Take any medication unless instructed by your physician -Make any legal decisions or sign important papers.  Meals: Start with liquid foods such as gelatin or soup. Progress to regular foods as tolerated. Avoid greasy, spicy, heavy foods. If nausea and/or vomiting occur, drink only clear liquids until the nausea and/or vomiting subsides. Call your physician if vomiting continues.  Special Instructions/Symptoms: Your throat may feel dry or sore from the anesthesia or the breathing tube placed in your throat during surgery. If this causes discomfort, gargle with warm salt water. The discomfort should disappear within 24 hours.

## 2014-07-17 NOTE — Interval H&P Note (Signed)
History and Physical Interval Note:  07/17/2014 7:37 AM  [name]  has presented today for surgery, with the diagnosis of HISTORY OF MALIGNANT MELINOMA OF LEFT CHEEK  The various methods of treatment have been discussed with the patient and family. After consideration of risks, benefits and other options for treatment, the patient has consented to  Procedure(s): IRRIGATION AND DEBRIDEMENT LEFT CHEEK WOUND WITH PLACEMENT OF ACELL (Left) as a surgical intervention .  The patient's history has been reviewed, patient examined, no change in status, stable for surgery.  I have reviewed the patient's chart and labs.  Questions were answered to the patient's satisfaction.     SANGER,Russel Morain

## 2014-07-17 NOTE — Brief Op Note (Signed)
07/17/2014  9:07 AM  PATIENT:  [name]  73 y.o. male  PRE-OPERATIVE DIAGNOSIS:  HISTORY OF MALIGNANT MELINOMA OF LEFT CHEEK  POST-OPERATIVE DIAGNOSIS:  Left cheek wound 3 x 2 cm  PROCEDURE:  Procedure(s): IRRIGATION AND DEBRIDEMENT LEFT CHEEK WOUND WITH PLACEMENT OF ACELL (Left)  SURGEON:  Surgeon(s) and Role:    * Claire Sanger, DO - Primary  PHYSICIAN ASSISTANT: Shawn Rayburn, PA  ASSISTANTS: none   ANESTHESIA:   general  EBL:  Total I/O In: 200 [I.V.:200] Out: -   BLOOD ADMINISTERED:none  DRAINS: none   LOCAL MEDICATIONS USED:  NONE  SPECIMEN:  No Specimen  DISPOSITION OF SPECIMEN:  N/A  COUNTS:  YES  TOURNIQUET:  * No tourniquets in log *  DICTATION: .Dragon Dictation  PLAN OF CARE: Discharge to home after PACU  PATIENT DISPOSITION:  PACU - hemodynamically stable.   Delay start of Pharmacological VTE agent (>24hrs) due to surgical blood loss or risk of bleeding: no

## 2014-07-17 NOTE — Transfer of Care (Signed)
Immediate Anesthesia Transfer of Care Note  Patient: [name]  Procedure(s) Performed: Procedure(s): IRRIGATION AND DEBRIDEMENT LEFT CHEEK WOUND WITH PLACEMENT OF ACELL (Left)  Patient Location: PACU  Anesthesia Type:General  Level of Consciousness: awake  Airway & Oxygen Therapy: Patient Spontanous Breathing  Post-op Assessment: Report given to PACU RN and Post -op Vital signs reviewed and stable  Post vital signs: Reviewed and stable  Complications: No apparent anesthesia complications

## 2014-07-17 NOTE — H&P (View-Only) (Signed)
[name] is an 73 y.o. male.   Chief Complaint: left cheek wound HPI: The patient is a 73 yrs old wm here for a postoperative follow up after repair of his left cheek. He underwent excision of a left cheek melanoma three days ago. The margins are negative and there are no abnormal nodes. The area was ~ 4 x 5 cm on the left cheek. The muscle was intact and there was no sign of infection. He has not had any recent illnesses. He underwent a tissue rotation for repair last week. No sign of infection but the superior portion of the flap is nonviable.  Past Medical History  Diagnosis Date  . Coronary artery disease   . Hypertension   . Diabetes mellitus   . Paroxysmal atrial fibrillation   . Current use of long term anticoagulation   . Dyslipidemia   . Edema   . Hyperlipidemia   . Gout   . History of acute myocardial infarction 11/2005    acute anteroseptal myocardial infarction  . Osteoarthritis   . Chest pain July 2012    s/p repeat cath; stable anatomy. Managed medically  . Wears glasses   . Full dentures     Past Surgical History  Procedure Laterality Date  . Coronary artery bypass graft  2005  . Cardiac catheterization  05/18/2009  . Cardiac catheterization  July 2012    Stable anatomy. Manage medically  . Tonsillectomy    . Colonoscopy    . Debridement and closure wound Left 06/18/2014    Procedure: CLOSURE OF OPEN WOUND OF LEFT CHEEK/POSSIBLE CHEEK ADVANCEMENT/ROTATION;  Surgeon: Theodoro Kos, DO;  Location: [facility];  Service: Plastics;  Laterality: Left;    Family History  Problem Relation Age of Onset  . Coronary artery disease Mother   . Heart disease Mother   . Coronary artery disease Father   . Heart disease Father   . Cancer Brother   . Cancer Brother    Social History:  reports that he quit smoking about 31 years ago. His smoking use included Cigarettes. He smoked 0.00 packs per day. He has never used smokeless tobacco. He reports that he  does not drink alcohol or use illicit drugs.  Allergies:  Allergies  Allergen Reactions  . Cephalexin Rash     (Not in a hospital admission)  Results for orders placed or performed during the hospital encounter of 07/17/14 (from the past 48 hour(s))  I-STAT, chem 8     Status: Abnormal   Collection Time: 07/16/14 12:36 PM  Result Value Ref Range   Sodium 140 137 - 147 mEq/L   Potassium 4.3 3.7 - 5.3 mEq/L   Chloride 107 96 - 112 mEq/L   BUN 15 6 - 23 mg/dL   Creatinine, Ser 1.00 0.50 - 1.35 mg/dL   Glucose, Bld 120 (H) 70 - 99 mg/dL   Calcium, Ion 1.06 (L) 1.13 - 1.30 mmol/L   TCO2 24 0 - 100 mmol/L   Hemoglobin 15.3 13.0 - 17.0 g/dL   HCT 45.0 39.0 - 52.0 %  APTT     Status: None   Collection Time: 07/17/14  7:06 AM  Result Value Ref Range   aPTT 34 24 - 37 seconds  Protime-INR     Status: None   Collection Time: 07/17/14  7:06 AM  Result Value Ref Range   Prothrombin Time 13.8 11.6 - 15.2 seconds   INR 1.05 0.00 - 1.49  Glucose, capillary  Status: Abnormal   Collection Time: 07/17/14  7:06 AM  Result Value Ref Range   Glucose-Capillary 115 (H) 70 - 99 mg/dL   No results found.  Review of Systems  Constitutional: Negative.   HENT: Negative.   Eyes: Negative.   Respiratory: Negative.   Cardiovascular: Negative.   Gastrointestinal: Negative.   Genitourinary: Negative.   Musculoskeletal: Negative.   Skin: Negative.   Psychiatric/Behavioral: Negative.     There were no vitals taken for this visit. Physical Exam  Constitutional: He appears well-developed and well-nourished.  HENT:  Head: Normocephalic.    Eyes: Conjunctivae and EOM are normal. Pupils are equal, round, and reactive to light.  Respiratory: Effort normal.  GI: Soft.  Neurological: He is alert.  Psychiatric: He has a normal mood and affect. His behavior is normal. Thought content normal.     Assessment/Plan Plan for excision of left cheek wound with placement of  Acell.  SANGER,Harol Shabazz 07/17/2014, 7:35 AM

## 2014-07-17 NOTE — Anesthesia Preprocedure Evaluation (Signed)
Anesthesia Evaluation  Patient identified by MRN, date of birth, ID band Patient awake    Reviewed: Allergy & Precautions, H&P , NPO status , Patient's Chart, lab work & pertinent test results  Airway Mallampati: II   Neck ROM: full    Dental   Pulmonary former smoker,          Cardiovascular hypertension, + CAD and + Past MI + dysrhythmias Atrial Fibrillation     Neuro/Psych    GI/Hepatic   Endo/Other  diabetes, Type 2  Renal/GU      Musculoskeletal  (+) Arthritis -,   Abdominal   Peds  Hematology   Anesthesia Other Findings   Reproductive/Obstetrics                             Anesthesia Physical Anesthesia Plan  ASA: III  Anesthesia Plan: General   Post-op Pain Management:    Induction: Intravenous  Airway Management Planned: Oral ETT  Additional Equipment:   Intra-op Plan:   Post-operative Plan: Extubation in OR  Informed Consent: I have reviewed the patients History and Physical, chart, labs and discussed the procedure including the risks, benefits and alternatives for the proposed anesthesia with the patient or authorized representative who has indicated his/her understanding and acceptance.     Plan Discussed with: CRNA, Anesthesiologist and Surgeon  Anesthesia Plan Comments:         Anesthesia Quick Evaluation

## 2014-07-17 NOTE — H&P (Signed)
Carlton Buskey is an 73 y.o. male.   Chief Complaint: left cheek wound HPI: The patient is a 73 yrs old wm here for a postoperative follow up after repair of his left cheek. He underwent excision of a left cheek melanoma three days ago. The margins are negative and there are no abnormal nodes. The area was ~ 4 x 5 cm on the left cheek. The muscle was intact and there was no sign of infection. He has not had any recent illnesses. He underwent a tissue rotation for repair last week. No sign of infection but the superior portion of the flap is nonviable.  Past Medical History  Diagnosis Date  . Coronary artery disease   . Hypertension   . Diabetes mellitus   . Paroxysmal atrial fibrillation   . Current use of long term anticoagulation   . Dyslipidemia   . Edema   . Hyperlipidemia   . Gout   . History of acute myocardial infarction 11/2005    acute anteroseptal myocardial infarction  . Osteoarthritis   . Chest pain July 2012    s/p repeat cath; stable anatomy. Managed medically  . Wears glasses   . Full dentures     Past Surgical History  Procedure Laterality Date  . Coronary artery bypass graft  2005  . Cardiac catheterization  05/18/2009  . Cardiac catheterization  July 2012    Stable anatomy. Manage medically  . Tonsillectomy    . Colonoscopy    . Debridement and closure wound Left 06/18/2014    Procedure: CLOSURE OF OPEN WOUND OF LEFT CHEEK/POSSIBLE CHEEK ADVANCEMENT/ROTATION;  Surgeon: Theodoro Kos, DO;  Location: [facility];  Service: Plastics;  Laterality: Left;    Family History  Problem Relation Age of Onset  . Coronary artery disease Mother   . Heart disease Mother   . Coronary artery disease Father   . Heart disease Father   . Cancer Brother   . Cancer Brother    Social History:  reports that he quit smoking about 31 years ago. His smoking use included Cigarettes. He smoked 0.00 packs per day. He has never used smokeless tobacco. He reports that he  does not drink alcohol or use illicit drugs.  Allergies:  Allergies  Allergen Reactions  . Cephalexin Rash     (Not in a hospital admission)  Results for orders placed or performed during the hospital encounter of 07/17/14 (from the past 48 hour(s))  I-STAT, chem 8     Status: Abnormal   Collection Time: 07/16/14 12:36 PM  Result Value Ref Range   Sodium 140 137 - 147 mEq/L   Potassium 4.3 3.7 - 5.3 mEq/L   Chloride 107 96 - 112 mEq/L   BUN 15 6 - 23 mg/dL   Creatinine, Ser 1.00 0.50 - 1.35 mg/dL   Glucose, Bld 120 (H) 70 - 99 mg/dL   Calcium, Ion 1.06 (L) 1.13 - 1.30 mmol/L   TCO2 24 0 - 100 mmol/L   Hemoglobin 15.3 13.0 - 17.0 g/dL   HCT 45.0 39.0 - 52.0 %  APTT     Status: None   Collection Time: 07/17/14  7:06 AM  Result Value Ref Range   aPTT 34 24 - 37 seconds  Protime-INR     Status: None   Collection Time: 07/17/14  7:06 AM  Result Value Ref Range   Prothrombin Time 13.8 11.6 - 15.2 seconds   INR 1.05 0.00 - 1.49  Glucose, capillary  Status: Abnormal   Collection Time: 07/17/14  7:06 AM  Result Value Ref Range   Glucose-Capillary 115 (H) 70 - 99 mg/dL   No results found.  Review of Systems  Constitutional: Negative.   HENT: Negative.   Eyes: Negative.   Respiratory: Negative.   Cardiovascular: Negative.   Gastrointestinal: Negative.   Genitourinary: Negative.   Musculoskeletal: Negative.   Skin: Negative.   Psychiatric/Behavioral: Negative.     There were no vitals taken for this visit. Physical Exam  Constitutional: He appears well-developed and well-nourished.  HENT:  Head: Normocephalic.    Eyes: Conjunctivae and EOM are normal. Pupils are equal, round, and reactive to light.  Respiratory: Effort normal.  GI: Soft.  Neurological: He is alert.  Psychiatric: He has a normal mood and affect. His behavior is normal. Thought content normal.     Assessment/Plan Plan for excision of left cheek wound with placement of  Acell.  SANGER,Bracken Moffa 07/17/2014, 7:35 AM

## 2014-07-17 NOTE — Anesthesia Postprocedure Evaluation (Signed)
Anesthesia Post Note  Patient: [name]  Procedure(s) Performed: Procedure(s) (LRB): IRRIGATION AND DEBRIDEMENT LEFT CHEEK WOUND WITH PLACEMENT OF ACELL (Left)  Anesthesia type: General  Patient location: PACU  Post pain: Pain level controlled and Adequate analgesia  Post assessment: Post-op Vital signs reviewed, Patient's Cardiovascular Status Stable, Respiratory Function Stable, Patent Airway and Pain level controlled  Last Vitals:  Filed Vitals:   07/17/14 1030  BP: 118/50  Pulse: 57  Temp: 36.5 C  Resp: 16    Post vital signs: Reviewed and stable  Level of consciousness: awake, alert  and oriented  Complications: No apparent anesthesia complications

## 2014-07-17 NOTE — Op Note (Signed)
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  SURGEON: Theodoro Kos, DO  ASSISTANT: Shawn Rayburn, PA  ANESTHESIA:  General.   COMPLICATIONS: None.   INDICATIONS FOR PROCEDURE:  The patient, [name] is a 73 y.o. male born on [birth year], is here for treatment of a left cheek wound. MRN: [id number]  CONSENT:  Informed consent was obtained directly from the patient. Risks, benefits and alternatives were fully discussed. Specific risks including but not limited to bleeding, infection, hematoma, seroma, scarring, pain, infection, contracture, asymmetry, wound healing problems, and need for further surgery were all discussed. The patient did have an ample opportunity to have questions answered to satisfaction.   DESCRIPTION OF PROCEDURE:  The patient was taken to the operating room. SCDs were placed and IV antibiotics were given. The patient's operative site was prepped and draped in a sterile fashion. A time out was performed and all information was confirmed to be correct.  General anesthesia was administered.  The #15 blade was used to debride the escar.  Hemostasis was achieved with electrocautery.  The acell powder and sheet were applied to the wound (2 x 3 cm).   It was secured with 5-0 Monocryl, then adaptic and surgical gel.  Gauze and tape were applied. The patient tolerated the procedure well.  There were no complications. The patient was allowed to wake from anesthesia, extubated and taken to the recovery room in satisfactory condition.

## 2014-07-18 ENCOUNTER — Encounter (HOSPITAL_BASED_OUTPATIENT_CLINIC_OR_DEPARTMENT_OTHER): Payer: Self-pay | Admitting: Plastic Surgery

## 2014-07-18 NOTE — Telephone Encounter (Signed)
Returned call to patient 07/14/14 Dr.Jordan advised ok to hold coumadin 5 days prior to colonoscopy.

## 2014-07-22 ENCOUNTER — Other Ambulatory Visit: Payer: Self-pay | Admitting: Cardiology

## 2014-07-23 ENCOUNTER — Other Ambulatory Visit: Payer: Self-pay | Admitting: Cardiology

## 2014-07-24 ENCOUNTER — Other Ambulatory Visit: Payer: Self-pay | Admitting: *Deleted

## 2014-07-24 ENCOUNTER — Other Ambulatory Visit: Payer: Self-pay | Admitting: Cardiology

## 2014-07-30 ENCOUNTER — Telehealth: Payer: Self-pay

## 2014-07-30 NOTE — Telephone Encounter (Signed)
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

## 2014-08-01 ENCOUNTER — Other Ambulatory Visit: Payer: Self-pay | Admitting: Cardiology

## 2014-08-04 ENCOUNTER — Ambulatory Visit (INDEPENDENT_AMBULATORY_CARE_PROVIDER_SITE_OTHER): Payer: Medicare Other | Admitting: *Deleted

## 2014-08-04 DIAGNOSIS — Z7901 Long term (current) use of anticoagulants: Secondary | ICD-10-CM

## 2014-08-04 DIAGNOSIS — Z5181 Encounter for therapeutic drug level monitoring: Secondary | ICD-10-CM

## 2014-08-04 DIAGNOSIS — I4891 Unspecified atrial fibrillation: Secondary | ICD-10-CM

## 2014-08-04 LAB — POCT INR: INR: 1.8

## 2014-08-15 ENCOUNTER — Ambulatory Visit (INDEPENDENT_AMBULATORY_CARE_PROVIDER_SITE_OTHER): Payer: Medicare Other | Admitting: Cardiology

## 2014-08-15 ENCOUNTER — Encounter: Payer: Self-pay | Admitting: Cardiology

## 2014-08-15 ENCOUNTER — Ambulatory Visit (INDEPENDENT_AMBULATORY_CARE_PROVIDER_SITE_OTHER): Payer: Medicare Other | Admitting: Pharmacist Clinician (PhC)/ Clinical Pharmacy Specialist

## 2014-08-15 VITALS — BP 150/66 | HR 70 | Ht 69.0 in | Wt 215.7 lb

## 2014-08-15 DIAGNOSIS — E785 Hyperlipidemia, unspecified: Secondary | ICD-10-CM

## 2014-08-15 DIAGNOSIS — I4892 Unspecified atrial flutter: Secondary | ICD-10-CM

## 2014-08-15 DIAGNOSIS — I2510 Atherosclerotic heart disease of native coronary artery without angina pectoris: Secondary | ICD-10-CM

## 2014-08-15 DIAGNOSIS — Z5181 Encounter for therapeutic drug level monitoring: Secondary | ICD-10-CM

## 2014-08-15 DIAGNOSIS — I4891 Unspecified atrial fibrillation: Secondary | ICD-10-CM

## 2014-08-15 DIAGNOSIS — I25118 Atherosclerotic heart disease of native coronary artery with other forms of angina pectoris: Secondary | ICD-10-CM

## 2014-08-15 DIAGNOSIS — Z7901 Long term (current) use of anticoagulants: Secondary | ICD-10-CM

## 2014-08-15 DIAGNOSIS — I10 Essential (primary) hypertension: Secondary | ICD-10-CM

## 2014-08-15 LAB — POCT INR: INR: 1.7

## 2014-08-15 NOTE — Patient Instructions (Signed)
Continue your current therapy  I will see you in 6 months.   

## 2014-08-15 NOTE — Progress Notes (Signed)
[name] Date of Birth: [birth year]   History of Present Illness: [name] is seen today for follow up CAD and atrial flutter.  He has a history of coronary disease with prior coronary bypass surgery in 2005. Last evaluation with cardiac catheterization in July of 2012 showed a patent LIMA graft to the LAD. The second marginal branch was occluded and the first marginal branch had a moderately severe stenosis. This is treated medically. His last Myoview study in November 2013 showed a small apical fixed defect without ischemia. Ejection fraction 69%. He does have a history of paroxysmal atrial fibrillation and is on chronic Coumadin therapy. In February 2015 he had atrial flutter that resolved with IV Cardizem.  Echo in March 2015 showed a normal study except for mild diastolic dysfunction. On follow up he does note some dyspnea  going up stairs but otherwise feels well. Denies chest pain or palpitations. Sugars have been OK. BP at home less than 140/70. Labs followed by Dr. Lavone Neri.  Current Outpatient Prescriptions on File Prior to Visit  Medication Sig Dispense Refill  . acetaminophen (TYLENOL) 500 MG tablet Take 1,000 mg by mouth daily.     Marland Kitchen allopurinol (ZYLOPRIM) 300 MG tablet TAKE 1 TABLET (300 MG TOTAL) BY MOUTH DAILY.(NEED TO CONTACT [phone] SCHEDULE APPOINTMENT FOR FUTURE REFILLS) 30 tablet 6  . amLODipine-atorvastatin (CADUET) 10-40 MG per tablet TAKE 1 TABLET BY MOUTH DAILY. 30 tablet 0  . Ascorbic Acid (VITAMIN C) 500 MG tablet Take 1,000 mg by mouth daily.     Marland Kitchen b complex vitamins tablet Take 1 tablet by mouth daily.     . cloNIDine (CATAPRES) 0.1 MG tablet TAKE 1 TABLET BY MOUTH IN THE MORNING AND 2 TABLETS BY MOUTH IN THE EVENING 90 tablet 1  . fish oil-omega-3 fatty acids 1000 MG capsule Take 1 g by mouth 2 (two) times daily.     . furosemide (LASIX) 20 MG tablet Take 1 tablet (20 mg total) by mouth daily. 30 tablet 6  . GLIPIZIDE PO Take 5 mg by mouth daily.      . isosorbide mononitrate (IMDUR) 30 MG 24 hr tablet TAKE 1 TABLET DAILY 90 tablet 1  . Lancets (ONETOUCH ULTRASOFT) lancets     . lisinopril (PRINIVIL,ZESTRIL) 40 MG tablet TAKE 1 TABLET (40 MG TOTAL) BY MOUTH DAILY. 30 tablet 0  . metFORMIN (GLUCOPHAGE-XR) 500 MG 24 hr tablet Take 500 mg by mouth 2 (two) times daily with a meal.    . metoprolol succinate (TOPROL-XL) 100 MG 24 hr tablet TAKE ONE TABLET BY MOUTH DAILY 30 tablet 0  . Multiple Vitamin (MULTIVITAMIN WITH MINERALS) TABS tablet Take 1 tablet by mouth daily.    Marland Kitchen NITROSTAT 0.4 MG SL tablet PLACE 1 TABLET (0.4 MG TOTAL) UNDER THE TONGUE EVERY 5  MINUTES AS NEEDED FOR CHEST PAIN. 100 tablet 1  . ONE TOUCH ULTRA TEST test strip     . traMADol (ULTRAM) 50 MG tablet Take 1 tablet (50 mg total) by mouth every 6 (six) hours as needed for moderate pain. 30 tablet 0  . warfarin (COUMADIN) 5 MG tablet TAKE 1 TABLET (5 MG TOTAL) BY MOUTH AS DIRECTED. 100 tablet 1   No current facility-administered medications on file prior to visit.    Allergies  Allergen Reactions  . Cephalexin Rash    Past Medical History  Diagnosis Date  . Coronary artery disease   . Hypertension   . Diabetes mellitus   . Paroxysmal atrial  fibrillation   . Current use of long term anticoagulation   . Dyslipidemia   . Edema   . Hyperlipidemia   . Gout   . History of acute myocardial infarction 11/2005    acute anteroseptal myocardial infarction  . Osteoarthritis   . Chest pain July 2012    s/p repeat cath; stable anatomy. Managed medically  . Wears glasses   . Full dentures     Past Surgical History  Procedure Laterality Date  . Coronary artery bypass graft  2005  . Cardiac catheterization  05/18/2009  . Cardiac catheterization  July 2012    Stable anatomy. Manage medically  . Tonsillectomy    . Colonoscopy    . Debridement and closure wound Left 06/18/2014    Procedure: CLOSURE OF OPEN WOUND OF LEFT CHEEK/POSSIBLE CHEEK ADVANCEMENT/ROTATION;   Surgeon: Theodoro Kos, DO;  Location: [facility];  Service: Plastics;  Laterality: Left;  . Incision and drainage of wound Left 07/17/2014    Procedure: IRRIGATION AND DEBRIDEMENT LEFT CHEEK WOUND WITH PLACEMENT OF ACELL;  Surgeon: Theodoro Kos, DO;  Location: [facility];  Service: Plastics;  Laterality: Left;    History  Smoking status  . Former Smoker  . Types: Cigarettes  . Quit date: 09/05/1982  Smokeless tobacco  . Never Used    History  Alcohol Use No    Family History  Problem Relation Age of Onset  . Coronary artery disease Mother   . Heart disease Mother   . Coronary artery disease Father   . Heart disease Father   . Cancer Brother   . Cancer Brother     Review of Systems: The review of systems is as above. Recent removal of melanoma left cheek.  All other systems were reviewed and are negative.  Physical Exam: BP 150/66 mmHg  Pulse 70  Ht 5\' 9"  (1.753 m)  Wt 215 lb 11.2 oz (97.841 kg)  BMI 31.84 kg/m2 Patient is very pleasant and in no acute distress. Skin is warm and dry. Color is normal.  HEENT is unremarkable. Normocephalic/atraumatic. PERRL. Sclera are nonicteric. Surgical dressing left cheek. Neck is supple. No masses. No JVD. Lungs are clear. Cardiac exam shows a regular rate and rhythm. Abdomen is soft. Extremities reveal mild ankle edema.  Gait and ROM are intact. No gross neurologic deficits noted.  LABORATORY DATA: INR pending.   Lab Results  Component Value Date   WBC 10.9* 10/27/2013   HGB 15.3 07/16/2014   HCT 45.0 07/16/2014   PLT 240 10/27/2013   GLUCOSE 120* 07/16/2014   CHOL 135 04/02/2012   TRIG 130.0 04/02/2012   HDL 41.30 04/02/2012   LDLCALC 68 04/02/2012   ALT 28 03/18/2011   AST 27 03/18/2011   NA 140 07/16/2014   K 4.3 07/16/2014   CL 107 07/16/2014   CREATININE 1.00 07/16/2014   BUN 15 07/16/2014   CO2 27 06/17/2014   TSH 0.882 03/19/2011   INR 1.8 08/04/2014   HGBA1C * 05/16/2009     6.3 (NOTE) The ADA recommends the following therapeutic goal for glycemic control related to Hgb A1c measurement: Goal of therapy: <6.5 Hgb A1c  Reference: American Diabetes Association: Clinical Practice Recommendations 2010, Diabetes Care, 2010, 33: (Suppl  1).     Ecg: 10/27/13- atrial flutter with RVR.  Echo: 11/18/13:Study Conclusions  - Left ventricle: The cavity size was normal. Wall thickness was normal. Systolic function was normal. The estimated ejection fraction was in the range of 60%  to 65%. Wall motion was normal; there were no regional wall motion abnormalities. Doppler parameters are consistent with abnormal left ventricular relaxation (grade 1 diastolic dysfunction). The E/e' ratio is >10, suggesting elevated LV filling pressure. - Left atrium: The atrium was normal in size. - Atrial septum: No defect or patent foramen ovale was identified. - Inferior vena cava: The vessel was normal in size; the respirophasic diameter changes were in the normal range (= 50%); findings are consistent with normal central venous pressure. - Pericardium, extracardiac: There was no pericardial effusion.    Assessment / Plan:  1. Coronary disease status post CABG. Vein graft to the OM is known to be occluded. He is minimally symptomatic on medical therapy with dyspnea on incline. Myoview study in November 2013 showed no significant ischemia. He will continue with his medical therapy including metoprolol, isosorbide, and amlodipine.  2. Atrial fibrillation/flutter. He is on chronic Coumadin therapy. He remains in sinus rhythm at this time. We'll continue with rate control with metoprolol.   3. Hyperlipidemia. On statin therapy and fish oil.  4. HTN with fair control on multiple meds.   5. 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  Will check INR today. Follow up in 6 months.

## 2014-08-23 ENCOUNTER — Other Ambulatory Visit: Payer: Self-pay | Admitting: Cardiology

## 2014-08-25 NOTE — Telephone Encounter (Signed)
Jose M Martinique, MD at 08/15/2014 8:14 AM  amLODipine-atorvastatin (CADUET) 10-40 MG per tablet TAKE 1 TABLET BY MOUTH DAILY.   lisinopril (PRINIVIL,ZESTRIL) 40 MG tablet TAKE 1 TABLET (40 MG TOTAL) BY MOUTH DAILY.   metoprolol succinate (TOPROL-XL) 100 MG 24 hr tablet TAKE ONE TABLET BY MOUTH DAILY    Patient Instructions     Continue your current therapy  I will see you in 6 months

## 2014-09-01 ENCOUNTER — Ambulatory Visit (INDEPENDENT_AMBULATORY_CARE_PROVIDER_SITE_OTHER): Payer: Medicare Other | Admitting: *Deleted

## 2014-09-01 DIAGNOSIS — Z7901 Long term (current) use of anticoagulants: Secondary | ICD-10-CM

## 2014-09-01 DIAGNOSIS — Z5181 Encounter for therapeutic drug level monitoring: Secondary | ICD-10-CM

## 2014-09-01 DIAGNOSIS — I4891 Unspecified atrial fibrillation: Secondary | ICD-10-CM

## 2014-09-01 LAB — POCT INR: INR: 2.4

## 2014-09-24 ENCOUNTER — Other Ambulatory Visit: Payer: Self-pay | Admitting: Cardiology

## 2014-10-02 ENCOUNTER — Ambulatory Visit (INDEPENDENT_AMBULATORY_CARE_PROVIDER_SITE_OTHER): Payer: Medicare Other | Admitting: *Deleted

## 2014-10-02 DIAGNOSIS — Z7901 Long term (current) use of anticoagulants: Secondary | ICD-10-CM

## 2014-10-02 DIAGNOSIS — I4891 Unspecified atrial fibrillation: Secondary | ICD-10-CM

## 2014-10-02 DIAGNOSIS — Z5181 Encounter for therapeutic drug level monitoring: Secondary | ICD-10-CM

## 2014-10-02 LAB — POCT INR: INR: 2

## 2014-10-25 ENCOUNTER — Other Ambulatory Visit: Payer: Self-pay | Admitting: Cardiology

## 2014-10-28 ENCOUNTER — Encounter (HOSPITAL_COMMUNITY): Payer: Self-pay | Admitting: Family Medicine

## 2014-10-28 ENCOUNTER — Emergency Department (HOSPITAL_COMMUNITY)
Admission: EM | Admit: 2014-10-28 | Discharge: 2014-10-28 | Disposition: A | Discharge status: Home / Self Care | Payer: Medicare Other | Attending: Emergency Medicine | Admitting: Emergency Medicine

## 2014-10-28 DIAGNOSIS — Z79899 Other long term (current) drug therapy: Secondary | ICD-10-CM | POA: Insufficient documentation

## 2014-10-28 DIAGNOSIS — E119 Type 2 diabetes mellitus without complications: Secondary | ICD-10-CM | POA: Insufficient documentation

## 2014-10-28 DIAGNOSIS — Z7901 Long term (current) use of anticoagulants: Secondary | ICD-10-CM | POA: Diagnosis not present

## 2014-10-28 DIAGNOSIS — I252 Old myocardial infarction: Secondary | ICD-10-CM | POA: Diagnosis not present

## 2014-10-28 DIAGNOSIS — M109 Gout, unspecified: Secondary | ICD-10-CM | POA: Insufficient documentation

## 2014-10-28 DIAGNOSIS — E785 Hyperlipidemia, unspecified: Secondary | ICD-10-CM | POA: Insufficient documentation

## 2014-10-28 DIAGNOSIS — Z98811 Dental restoration status: Secondary | ICD-10-CM | POA: Insufficient documentation

## 2014-10-28 DIAGNOSIS — Z87891 Personal history of nicotine dependence: Secondary | ICD-10-CM | POA: Diagnosis not present

## 2014-10-28 DIAGNOSIS — Z951 Presence of aortocoronary bypass graft: Secondary | ICD-10-CM | POA: Diagnosis not present

## 2014-10-28 DIAGNOSIS — I10 Essential (primary) hypertension: Secondary | ICD-10-CM | POA: Insufficient documentation

## 2014-10-28 DIAGNOSIS — I2510 Atherosclerotic heart disease of native coronary artery without angina pectoris: Secondary | ICD-10-CM | POA: Diagnosis not present

## 2014-10-28 DIAGNOSIS — I480 Paroxysmal atrial fibrillation: Secondary | ICD-10-CM | POA: Insufficient documentation

## 2014-10-28 DIAGNOSIS — Z9889 Other specified postprocedural states: Secondary | ICD-10-CM | POA: Diagnosis not present

## 2014-10-28 DIAGNOSIS — M1990 Unspecified osteoarthritis, unspecified site: Secondary | ICD-10-CM | POA: Insufficient documentation

## 2014-10-28 LAB — URINALYSIS, ROUTINE W REFLEX MICROSCOPIC
BILIRUBIN URINE: NEGATIVE
Glucose, UA: NEGATIVE mg/dL
HGB URINE DIPSTICK: NEGATIVE
KETONES UR: NEGATIVE mg/dL
Leukocytes, UA: NEGATIVE
NITRITE: NEGATIVE
PH: 7 (ref 5.0–8.0)
Protein, ur: NEGATIVE mg/dL
Specific Gravity, Urine: 1.004 — ABNORMAL LOW (ref 1.005–1.030)
Urobilinogen, UA: 0.2 mg/dL (ref 0.0–1.0)

## 2014-10-28 LAB — CBC WITH DIFFERENTIAL/PLATELET
BASOS PCT: 0 % (ref 0–1)
Basophils Absolute: 0 10*3/uL (ref 0.0–0.1)
EOS PCT: 3 % (ref 0–5)
Eosinophils Absolute: 0.3 10*3/uL (ref 0.0–0.7)
HCT: 44 % (ref 39.0–52.0)
Hemoglobin: 14.9 g/dL (ref 13.0–17.0)
LYMPHS PCT: 27 % (ref 12–46)
Lymphs Abs: 2.4 10*3/uL (ref 0.7–4.0)
MCH: 31.4 pg (ref 26.0–34.0)
MCHC: 33.9 g/dL (ref 30.0–36.0)
MCV: 92.8 fL (ref 78.0–100.0)
Monocytes Absolute: 0.9 10*3/uL (ref 0.1–1.0)
Monocytes Relative: 10 % (ref 3–12)
NEUTROS PCT: 60 % (ref 43–77)
Neutro Abs: 5.2 10*3/uL (ref 1.7–7.7)
PLATELETS: 220 10*3/uL (ref 150–400)
RBC: 4.74 MIL/uL (ref 4.22–5.81)
RDW: 13.5 % (ref 11.5–15.5)
WBC: 8.7 10*3/uL (ref 4.0–10.5)

## 2014-10-28 LAB — BASIC METABOLIC PANEL
Anion gap: 8 (ref 5–15)
BUN: 16 mg/dL (ref 6–23)
CALCIUM: 9.4 mg/dL (ref 8.4–10.5)
CO2: 26 mmol/L (ref 19–32)
Chloride: 102 mmol/L (ref 96–112)
Creatinine, Ser: 0.95 mg/dL (ref 0.50–1.35)
GFR calc Af Amer: >90 mL/min (ref >90)
GFR calc non Af Amer: 81 mL/min — ABNORMAL LOW (ref >90)
GLUCOSE: 128 mg/dL — AB (ref 70–99)
Potassium: 3.7 mmol/L (ref 3.5–5.1)
Sodium: 136 mmol/L (ref 135–145)

## 2014-10-28 LAB — PROTIME-INR
INR: 1.67 — ABNORMAL HIGH (ref 0.00–1.49)
Prothrombin Time: 19.8 seconds — ABNORMAL HIGH (ref 11.6–15.2)

## 2014-10-28 NOTE — Discharge Instructions (Signed)
Your Coumadin level, INR, was low today at 1.68.  Contact your Coumadin clinic in the morning for further instructions.  Your blood pressure was improved in the emergency department.  Your workup did not show any end organ damage associated with your high blood pressure.  Please contact your cardiologist for an appointment to go over your medications and blood pressure readings.  Check her blood pressure twice a day and keep a log until you're able to see your cardiologist.  Return to the emergency department for severe headache, chest pain, shortness of breath or other new concerning symptoms.    Anticoagulation, Generic Anticoagulants are medicines used to prevent clots from developing in your veins. These medicine are also known as blood thinners. If blood clots are untreated, they could travel to your lungs. This is called a pulmonary embolus. A blood clot in your lungs can be fatal.  Health care providers often use anticoagulants to prevent clots following surgery. Anticoagulants are also used along with aspirin when the heart is not getting enough blood. Another anticoagulant called warfarin is started 2 to 3 days after a rapid-acting injectable anticoagulant is started. The rapid-acting anticoagulants are usually continued until warfarin has begun to work. Your health care provider will judge this length of time by blood tests known as the prothrombin time (PT) and International Normalization Ratio (INR). This means that your blood is at the necessary and best level to prevent clots. RISKS AND COMPLICATIONS  If you have received recent epidural anesthesia, spinal anesthesia, or a spinal tap while receiving anticoagulants, you are at risk for developing a blood clot in or around the spine. This condition could result in long-term or permanent paralysis.  Because anticoagulants thin your blood, severe bleeding may occur from any tissue or organ. Symptoms of the blood being too thin may  include:  Bleeding from the nose or gums that does not stop quickly.  Blood in bowel movements which may appear as bright red, dark, or black tarry stools.  Blood in the urine which may appear as pink, red, or brown urine.  Unusual bruising or bruising easily.  A cut that does not stop bleeding within 10 minutes.  Vomiting blood or continuous nausea for more than 1 day.  Coughing up blood.  Broken blood vessels in your eye (subconjunctival hemorrhage).  Abdominal or back pain with or without flank bruising.  Sudden, severe headache.  Sudden weakness or numbness of the face, arm, or leg, especially on one side of the body.  Sudden confusion.  Trouble speaking (aphasia) or understanding.  Sudden trouble seeing in one or both eyes.  Sudden trouble walking.  Dizziness.  Loss of balance or coordination.  Vaginal bleeding.  Swelling or pain at an injection site.  Superficial fat tissue death (necrosis) which may cause skin scarring. This is more common in women and may first present as pain in the waist, thighs, or buttocks.  Fever.  Too little anticoagulation continues to allow the risk for blood clots. HOME CARE INSTRUCTIONS   Due to the complications of anticoagulants, it is very important that you take your anticoagulant as directed by your health care provider. Anticoagulants need to be taken exactly as instructed. Be sure you understand all your anticoagulant instructions.  Keep all follow-up appointments with your health care provider as directed. It is very important to keep your appointments. Not keeping appointments could result in a chronic or permanent injury, pain, or disability.  Warfarin. Your health care provider will advise you  on the length of treatment (usually 3-6 months, sometimes lifelong).  Take warfarin exactly as directed by your health care provider. It is recommended that you take your warfarin dose at the same time of the day. It is preferred  that you take warfarin in the late afternoon. If you have been told to stop taking warfarin, do not resume taking warfarin until directed to do so by your health care provider. Follow your health care provider's instructions if you accidentally take an extra dose or miss a dose of warfarin. It is very important to take warfarin as directed since bleeding or blood clots could result in chronic or permanent injury, pain, or disability.  Too much and too little warfarin are both dangerous. Too much warfarin increases the risk of bleeding. Too little warfarin continues to allow the risk for blood clots. While taking warfarin, you will need to have regular blood tests to measure your blood clotting time. These blood tests usually include both the prothrombin time (PT) and International Normalized Ratio (INR) tests. The PT and INR results allow your health care provider to adjust your dose of warfarin. The dose can change for many reasons. It is critically important that you have your PT and INR levels drawn exactly as directed. Your warfarin dose may stay the same or change depending on what the PT and INR results are. Be sure to follow up with your health care provider regarding your PT and INR test results and what your warfarin dosage should be.  Many medicines can interfere with warfarin and affect the PT and INR results. You must tell your health care provider about any and all medicines you take, this includes all vitamins and supplements. Ask your health care provider before taking these. Prescription and over-the-counter medicine consistency is critical to warfarin management. It is important that potential interactions are checked before you start a new medicine. Be especially cautious with aspirin and anti-inflammatory medicines. Ask your health care provider before taking these. Medicines such as antibiotics and acid-reducing medicine can interact with warfarin and can cause an increased warfarin effect.  Warfarin can also interfere with the effectiveness of medicines you are taking. Do not take or discontinue any prescribed or over-the-counter medicine except on the advice of your health care provider or pharmacist.  Some vitamins, supplements, and herbal products interfere with the effectiveness of warfarin. Vitamin E may increase the anticoagulant effects of warfarin. Vitamin K may can cause warfarin to be less effective. Do not take or discontinue any vitamin, supplement, or herbal product except on the advice of your health care provider or pharmacist.  Eat what you normally eat and keep the vitamin K content of your diet consistent. Avoid major changes in your diet, or notify your health care provider before changing your diet. Suddenly getting a lot more vitamin K could cause your blood to clot too quickly. A sudden decrease in vitamin K intake could cause your blood to clot too slowly. These changes in vitamin K intake could lead to dangerous blood clotsor to bleeding. To keep your vitamin K intake consistent, you must be aware of which foods contain moderate or high amounts of vitamin K. Some foods high in vitamin K include spinach, kale, broccoli, cabbage, greens, Brussels sprouts, asparagus, Bok [place]oy, coleslaw, parsley, and green tea. Arrange a visit with a dietitian to answer your questions.  If you have a loss of appetite or get the stomach flu (viral gastroenteritis), talk to your health care provider as soon  as possible. A decrease in your normal vitamin K intake can make you more sensitive to your usual dose of warfarin.  Some medical conditions may increase your risk for bleeding while you are taking warfarin. A fever, diarrhea lasting more than a day, worsening heart failure, or worsening liver function are some medical conditions that could affect warfarin. Contact your health care provider if you have any of these medical conditions.  Alcohol can change the body's ability to handle  warfarin. It is best to avoid alcoholic drinks or consume only very small amounts while taking warfarin. Notify your health care provider if you change your alcohol intake. A sudden increase in alcohol use can increase your risk of bleeding. Chronic alcohol use can cause warfarin to be less effective.  Be careful not to cut yourself when using sharp objects or while shaving.  Inform all your health care providers and your dentist that you take an anticoagulant.  Limit physical activities or sports that could result in a fall or cause injury. Avoid contact sports.  Wear medical alert jewelry or carry a medical alert card. SEEK IMMEDIATE MEDICAL CARE IF:  You cough up blood.  You have dark or black stools or there is bright red blood coming from your rectum.  You vomit blood or have nausea for more than 1 day.  You have blood in the urine or pink colored urine.  You have unusual bruising or have increased bruising.  You have bleeding from the nose or gums that does not stop quickly.  You have a cut that does not stop bleeding within a 2-3 minutes.  You have sudden weakness or numbness of the face, arm, or leg, especially on one side of the body.  You have sudden confusion.  You have trouble speaking (aphasia) or understanding.  You have sudden trouble seeing in one or both eyes.  You have sudden trouble walking.  You have dizziness.  You have a loss of balance or coordination.  You have a sudden, severe headache.  You have a serious fall or head injury, even if you are not bleeding.  You have swelling or pain at an injection site.  You have unexplained tenderness or pain in the abdomen, back, waist, thighs or buttocks.  You have a fever. Any of these symptoms may represent a serious problem that is an emergency. Do not wait to see if the symptoms will go away. Get medical help right away. Call your local emergency services (911 in [place] Do not drive yourself to the  hospital. Document Released: 08/22/2005 Document Revised: 08/27/2013 Document Reviewed: 03/26/2008 [facility] Patient Information 2015 [place], [place]. This information is not intended to replace advice given to you by your health care provider. Make sure you discuss any questions you have with your health care provider.  How to Take Your Blood Pressure HOW DO I GET A BLOOD PRESSURE MACHINE?  You can buy an electronic home blood pressure machine at your local pharmacy. Insurance will sometimes cover the cost if you have a prescription.  Ask your doctor what type of machine is best for you. There are different machines for your arm and your wrist.  If you decide to buy a machine to check your blood pressure on your arm, first check the size of your arm so you can buy the right size cuff. To check the size of your arm:   Use a measuring tape that shows both inches and centimeters.   Wrap the measuring tape around  the upper-middle part of your arm. You may need someone to help you measure.   Write down your arm measurement in both inches and centimeters.   To measure your blood pressure correctly, it is important to have the right size cuff.   If your arm is up to 13 inches (up to 34 centimeters), get an adult cuff size.  If your arm is 13 to 17 inches (35 to 44 centimeters), get a large adult cuff size.    If your arm is 17 to 20 inches (45 to 52 centimeters), get an adult thigh cuff.  WHAT DO THE NUMBERS MEAN?   There are two numbers that make up your blood pressure. For example: 120/80.  The first number (120 in our example) is called the "systolic pressure." It is a measure of the pressure in your blood vessels when your heart is pumping blood.  The second number (80 in our example) is called the "diastolic pressure." It is a measure of the pressure in your blood vessels when your heart is resting between beats.  Your doctor will tell you what your blood pressure should  be. WHAT SHOULD I DO BEFORE I CHECK MY BLOOD PRESSURE?   Try to rest or relax for at least 30 minutes before you check your blood pressure.  Do not smoke.  Do not have any drinks with caffeine, such as:  Soda.  Coffee.  Tea.  Check your blood pressure in a quiet room.  Sit down and stretch out your arm on a table. Keep your arm at about the level of your heart. Let your arm relax.  Make sure that your legs are not crossed. HOW DO I CHECK MY BLOOD PRESSURE?  Follow the directions that came with your machine.  Make sure you remove any tight-fighting clothing from your arm or wrist. Wrap the cuff around your upper arm or wrist. You should be able to fit a finger between the cuff and your arm. If you cannot fit a finger between the cuff and your arm, it is too tight and should be removed and rewrapped.  Some units require you to manually pump up the arm cuff.  Automatic units inflate the cuff when you press a button.  Cuff deflation is automatic in both models.  After the cuff is inflated, the unit measures your blood pressure and pulse. The readings are shown on a monitor. Hold still and breathe normally while the cuff is inflated.  Getting a reading takes less than a minute.  Some models store readings in a memory. Some provide a printout of readings. If your machine does not store your readings, keep a written record.  Take readings with you to your next visit with your doctor. Document Released: 08/04/2008 Document Revised: 01/06/2014 Document Reviewed: 10/17/2013 [facility] Patient Information 2015 [place], [place]. This information is not intended to replace advice given to you by your health care provider. Make sure you discuss any questions you have with your health care provider.  Managing Your High Blood Pressure Blood pressure is a measurement of how forceful your blood is pressing against the walls of the arteries. Arteries are muscular tubes within the circulatory  system. Blood pressure does not stay the same. Blood pressure rises when you are active, excited, or nervous; and it lowers during sleep and relaxation. If the numbers measuring your blood pressure stay above normal most of the time, you are at risk for health problems. High blood pressure (hypertension) is a long-term (chronic)  condition in which blood pressure is elevated. A blood pressure reading is recorded as two numbers, such as 120 over 80 (or 120/80). The first, higher number is called the systolic pressure. It is a measure of the pressure in your arteries as the heart beats. The second, lower number is called the diastolic pressure. It is a measure of the pressure in your arteries as the heart relaxes between beats.  Keeping your blood pressure in a normal range is important to your overall health and prevention of health problems, such as heart disease and stroke. When your blood pressure is uncontrolled, your heart has to work harder than normal. High blood pressure is a very common condition in adults because blood pressure tends to rise with age. Men and women are equally likely to have hypertension but at different times in life. Before age 62, men are more likely to have hypertension. After 73 years of age, women are more likely to have it. Hypertension is especially common in African Americans. This condition often has no signs or symptoms. The cause of the condition is usually not known. Your caregiver can help you come up with a plan to keep your blood pressure in a normal, healthy range. BLOOD PRESSURE STAGES Blood pressure is classified into four stages: normal, prehypertension, stage 1, and stage 2. Your blood pressure reading will be used to determine what type of treatment, if any, is necessary. Appropriate treatment options are tied to these four stages:  Normal  Systolic pressure (mm Hg): below 120.  Diastolic pressure (mm Hg): below 80. Prehypertension  Systolic pressure (mm  Hg): 120 to 139.  Diastolic pressure (mm Hg): 80 to 89. Stage1  Systolic pressure (mm Hg): 140 to 159.  Diastolic pressure (mm Hg): 90 to 99. Stage2  Systolic pressure (mm Hg): 160 or above.  Diastolic pressure (mm Hg): 100 or above. RISKS RELATED TO HIGH BLOOD PRESSURE Managing your blood pressure is an important responsibility. Uncontrolled high blood pressure can lead to:  A heart attack.  A stroke.  A weakened blood vessel (aneurysm).  Heart failure.  Kidney damage.  Eye damage.  Metabolic syndrome.  Memory and concentration problems. HOW TO MANAGE YOUR BLOOD PRESSURE Blood pressure can be managed effectively with lifestyle changes and medicines (if needed). Your caregiver will help you come up with a plan to bring your blood pressure within a normal range. Your plan should include the following: Education  Read all information provided by your caregivers about how to control blood pressure.  Educate yourself on the latest guidelines and treatment recommendations. New research is always being done to further define the risks and treatments for high blood pressure. Lifestylechanges  Control your weight.  Avoid smoking.  Stay physically active.  Reduce the amount of salt in your diet.  Reduce stress.  Control any chronic conditions, such as high cholesterol or diabetes.  Reduce your alcohol intake. Medicines  Several medicines (antihypertensive medicines) are available, if needed, to bring blood pressure within a normal range. Communication  Review all the medicines you take with your caregiver because there may be side effects or interactions.  Talk with your caregiver about your diet, exercise habits, and other lifestyle factors that may be contributing to high blood pressure.  See your caregiver regularly. Your caregiver can help you create and adjust your plan for managing high blood pressure. RECOMMENDATIONS FOR TREATMENT AND FOLLOW-UP  The  following recommendations are based on current guidelines for managing high blood pressure in nonpregnant adults.  Use these recommendations to identify the proper follow-up period or treatment option based on your blood pressure reading. You can discuss these options with your caregiver.  Systolic pressure of 707 to 615 or diastolic pressure of 80 to 89: Follow up with your caregiver as directed.  Systolic pressure of 183 to 437 or diastolic pressure of 90 to 100: Follow up with your caregiver within 2 months.  Systolic pressure above 357 or diastolic pressure above 897: Follow up with your caregiver within 1 month.  Systolic pressure above 847 or diastolic pressure above 841: Consider antihypertensive therapy; follow up with your caregiver within 1 week.  Systolic pressure above 282 or diastolic pressure above 081: Begin antihypertensive therapy; follow up with your caregiver within 1 week. Document Released: 05/16/2012 Document Reviewed: 05/16/2012 [facility] Patient Information 2015 [place]. This information is not intended to replace advice given to you by your health care provider. Make sure you discuss any questions you have with your health care provider.

## 2014-10-28 NOTE — ED Notes (Signed)
Pt resting quietly with eyes closed. Appears in no distress. Respirations are even, regular, and unlabored. Call bell with in reach.

## 2014-10-28 NOTE — ED Notes (Addendum)
Patient is from home. Pt's blood pressure has been elevated in the morning for last few days. Tonight, he felt dizzy and took his BP. BP was 201/108 at home. He took Clonidine 0.2mg  at 22:30. Took another Clonidine 0.1mg  at 00:50. Pt denies chest pain or shortness of breath.

## 2014-10-28 NOTE — ED Provider Notes (Signed)
CSN: [id number]     Arrival date & time 10/28/14  0159 History   First MD Initiated Contact with Patient 10/28/14 0222     Chief Complaint  Patient presents with  . Hypertension     (Consider location/radiation/quality/duration/timing/severity/associated sxs/prior Treatment) HPI 74 year old male presents to emergency department from home via EMS with complaint of elevated blood pressures.  Patient reports he was feeling woozy earlier and checked his blood pressure and noticed that it was higher than his norm.  Patient reports over last few days his blood pressures have been elevated.  He denies any chest pain, shortness of breath, headache, nausea, vomiting or diarrhea.  He denies missing any doses of his blood pressure medicines.  Patient has history of coronary disease, hypertension, diabetes pick's milk A. fib.  Patient is on Coumadin.  He is followed by Dr. Martinique with cardiology who manages his blood pressure medications.  Patient reports that he took again rescheduled dose of 0.2 of clonidine at 10:30.  When his blood pressure was elevated, he took an additional 0.1 mg of clonidine.  At this time.  His blood pressure has improved greatly and he is without complaint Past Medical History  Diagnosis Date  . Coronary artery disease   . Hypertension   . Diabetes mellitus   . Paroxysmal atrial fibrillation   . Current use of long term anticoagulation   . Dyslipidemia   . Edema   . Hyperlipidemia   . Gout   . History of acute myocardial infarction 11/2005    acute anteroseptal myocardial infarction  . Osteoarthritis   . Chest pain July 2012    s/p repeat cath; stable anatomy. Managed medically  . Wears glasses   . Full dentures    Past Surgical History  Procedure Laterality Date  . Coronary artery bypass graft  2005  . Cardiac catheterization  05/18/2009  . Cardiac catheterization  July 2012    Stable anatomy. Manage medically  . Tonsillectomy    . Colonoscopy    . Debridement  and closure wound Left 06/18/2014    Procedure: CLOSURE OF OPEN WOUND OF LEFT CHEEK/POSSIBLE CHEEK ADVANCEMENT/ROTATION;  Surgeon: Theodoro Kos, DO;  Location: [place];  Service: Plastics;  Laterality: Left;  . Incision and drainage of wound Left 07/17/2014    Procedure: IRRIGATION AND DEBRIDEMENT LEFT CHEEK WOUND WITH PLACEMENT OF ACELL;  Surgeon: Theodoro Kos, DO;  Location: [place];  Service: Plastics;  Laterality: Left;   Family History  Problem Relation Age of Onset  . Coronary artery disease Mother   . Heart disease Mother   . Coronary artery disease Father   . Heart disease Father   . Cancer Brother   . Cancer Brother    History  Substance Use Topics  . Smoking status: Former Smoker    Types: Cigarettes    Quit date: 09/06/1987  . Smokeless tobacco: Never Used  . Alcohol Use: No    Review of Systems   See History of Present Illness; otherwise all other systems are reviewed and negative  Allergies  Cephalexin  Home Medications   Prior to Admission medications   Medication Sig Start Date End Date Taking? Authorizing Provider  acetaminophen (TYLENOL) 500 MG tablet Take 1,000 mg by mouth daily.     Historical Provider, MD  allopurinol (ZYLOPRIM) 300 MG tablet TAKE 1 TABLET (300 MG TOTAL) BY MOUTH DAILY.(NEED TO CONTACT [phone] SCHEDULE APPOINTMENT FOR FUTURE REFILLS) 08/01/14   Peter M Martinique, MD  amLODipine-atorvastatin (CADUET) 10-40 MG per tablet TAKE 1 TABLET BY MOUTH DAILY. 08/25/14   Peter M Martinique, MD  Ascorbic Acid (VITAMIN C) 500 MG tablet Take 1,000 mg by mouth daily.     Historical Provider, MD  b complex vitamins tablet Take 1 tablet by mouth daily.     Historical Provider, MD  cloNIDine (CATAPRES) 0.1 MG tablet TAKE 1 TABLET BY MOUTH IN THE MORNING AND 2 TABLETS BY MOUTH IN THE EVENING 09/25/14   Peter M Martinique, MD  fish oil-omega-3 fatty acids 1000 MG capsule Take 1 g by mouth 2 (two) times daily.     Historical  Provider, MD  furosemide (LASIX) 20 MG tablet Take 1 tablet (20 mg total) by mouth daily. 06/04/14   Peter M Martinique, MD  GARLIC PO Take 962 mg by mouth daily.    Historical Provider, MD  GLIPIZIDE PO Take 5 mg by mouth daily.     Historical Provider, MD  isosorbide mononitrate (IMDUR) 30 MG 24 hr tablet TAKE 1 TABLET BY MOUTH DAILY 10/27/14   Peter M Martinique, MD  Lancets [facility] ULTRASOFT) lancets  09/28/10   Historical Provider, MD  lisinopril (PRINIVIL,ZESTRIL) 40 MG tablet TAKE 1 TABLET BY MOUTH DAILY. 08/25/14   Peter M Martinique, MD  metFORMIN (GLUCOPHAGE-XR) 500 MG 24 hr tablet Take 500 mg by mouth 2 (two) times daily with a meal.    Historical Provider, MD  metoprolol succinate (TOPROL-XL) 100 MG 24 hr tablet TAKE ONE TABLET BY MOUTH DAILY 08/25/14   Peter M Martinique, MD  Multiple Vitamin (MULTIVITAMIN WITH MINERALS) TABS tablet Take 1 tablet by mouth daily.    Historical Provider, MD  niacin (NIASPAN) 500 MG CR tablet TAKE 1 TABLET (500 MG TOTAL) BY MOUTH AT BEDTIME. 10/27/14   Peter M Martinique, MD  NITROSTAT 0.4 MG SL tablet PLACE 1 TABLET (0.4 MG TOTAL) UNDER THE TONGUE EVERY 5  MINUTES AS NEEDED FOR CHEST PAIN.    Peter M Martinique, MD  Omega-3 Fatty Acids (FISH OIL) 1000 MG CAPS Take 1 capsule by mouth daily.    Historical Provider, MD  ONE TOUCH ULTRA TEST test strip  09/28/10   Historical Provider, MD  traMADol (ULTRAM) 50 MG tablet Take 1 tablet (50 mg total) by mouth every 6 (six) hours as needed for moderate pain. 07/17/14   Shawn Rayburn, PA-C  warfarin (COUMADIN) 5 MG tablet TAKE 1 TABLET (5 MG TOTAL) BY MOUTH AS DIRECTED. 09/25/14   Peter M Martinique, MD   BP 152/77 mmHg  Pulse 66  Temp(Src) 97.8 F (36.6 C) (Oral)  Resp 20  Ht 5\' 9"  (1.753 m)  Wt 213 lb (96.616 kg)  BMI 31.44 kg/m2  SpO2 96% Physical Exam  Constitutional: He is oriented to person, place, and time. He appears well-developed and well-nourished.  HENT:  Head: Normocephalic and atraumatic.  Right Ear: External ear  normal.  Left Ear: External ear normal.  Nose: Nose normal.  Mouth/Throat: Oropharynx is clear and moist.  Eyes: Conjunctivae and EOM are normal. Pupils are equal, round, and reactive to light.  Neck: Normal range of motion. Neck supple. No JVD present. No tracheal deviation present. No thyromegaly present.  Cardiovascular: Normal rate, regular rhythm, normal heart sounds and intact distal pulses.  Exam reveals no gallop and no friction rub.   No murmur heard. Pulmonary/Chest: Effort normal and breath sounds normal. No stridor. No respiratory distress. He has no wheezes. He has no rales. He exhibits no tenderness.  Abdominal: Soft.  Bowel sounds are normal. He exhibits no distension and no mass. There is no tenderness. There is no rebound and no guarding.  Musculoskeletal: Normal range of motion. He exhibits no edema or tenderness.  Lymphadenopathy:    He has no cervical adenopathy.  Neurological: He is alert and oriented to person, place, and time. He displays normal reflexes. He exhibits normal muscle tone. Coordination normal.  Skin: Skin is warm and dry. No rash noted. No erythema. No pallor.  Psychiatric: He has a normal mood and affect. His behavior is normal. Judgment and thought content normal.  Nursing note and vitals reviewed.   ED Course  Procedures (including critical care time) Labs Review Labs Reviewed  BASIC METABOLIC PANEL - Abnormal; Notable for the following:    Glucose, Bld 128 (*)    GFR calc non Af Amer 81 (*)    All other components within normal limits  URINALYSIS, ROUTINE W REFLEX MICROSCOPIC - Abnormal; Notable for the following:    Specific Gravity, Urine 1.004 (*)    All other components within normal limits  PROTIME-INR - Abnormal; Notable for the following:    Prothrombin Time 19.8 (*)    INR 1.67 (*)    All other components within normal limits  CBC WITH DIFFERENTIAL/PLATELET    Imaging Review No results found.   EKG Interpretation   Date/Time:   Tuesday October 28 2014 02:35:02 EST Ventricular Rate:  79 PR Interval:  185 QRS Duration: 99 QT Interval:  420 QTC Calculation: 481 R Axis:   58 Text Interpretation:  Sinus rhythm Low voltage, precordial leads  Borderline prolonged QT interval Confirmed by Salihah Peckham  MD, Aubery Douthat ([id number]) on  10/28/2014 3:29:35 AM      MDM   Final diagnoses:  Essential hypertension  Current use of long term anticoagulation    74 year old male with hypertension at home, now resolved.  He has no end organ damage noted.  Patient's INR is subtherapeutic.  He reports he will call his Coumadin clinic in the morning.  He is been instructed to follow-up with his cardiologist for management of his blood pressure medications.  At this time he is stable for discharge home.    Kalman Drape, MD 10/28/14 [phone]

## 2014-10-28 NOTE — ED Notes (Addendum)
Patient is staying in room 13 til 6 am to call for a ride.

## 2014-10-30 ENCOUNTER — Ambulatory Visit (INDEPENDENT_AMBULATORY_CARE_PROVIDER_SITE_OTHER): Payer: Medicare Other

## 2014-10-30 DIAGNOSIS — I4891 Unspecified atrial fibrillation: Secondary | ICD-10-CM

## 2014-10-30 DIAGNOSIS — Z5181 Encounter for therapeutic drug level monitoring: Secondary | ICD-10-CM

## 2014-10-30 DIAGNOSIS — Z7901 Long term (current) use of anticoagulants: Secondary | ICD-10-CM

## 2014-10-30 LAB — POCT INR: INR: 2.2

## 2014-10-31 ENCOUNTER — Telehealth: Payer: Self-pay | Admitting: Cardiology

## 2014-10-31 NOTE — Telephone Encounter (Signed)
Returned call to patient.He stated his B/P has been elevated for the past 2 weeks. Ranging 710 to 626 [id number] diastolic.Pulse 58 to 70.Stated this past Tues 10/28/14 B/P 201/108.Stated he went to ER.Stated right before he went to ER he took a extra Clonidine.Stated when he got to ER B/P had came down.Stated he wanted to ask Dr.Jordan if he needed to increase any of his medications.Advised to avoid salt.Message sent to Hugo for advice.

## 2014-10-31 NOTE — Telephone Encounter (Signed)
I would increase Clonidine to 0.2 mg bid.  Amika Tassin Martinique MD, [facility]

## 2014-10-31 NOTE — Telephone Encounter (Signed)
[name] is calling because his bp went up on Monday and went to the E/R and was told to contact Dr. Martinique office to see if they may want to adjust his Medication. Please call   Thanks

## 2014-11-04 MED ORDER — CLONIDINE HCL 0.2 MG PO TABS: 0.2000 mg | ORAL_TABLET | Freq: Two times a day (BID) | ORAL | Status: DC

## 2014-11-04 NOTE — Telephone Encounter (Signed)
Returned call to patient Dr.Jordan advised to increase Clonidine to 0.2 mg twice a day.Advised to continue to monitor B/P and call back if continues to be elevated.

## 2014-11-04 NOTE — Addendum Note (Signed)
Addended by: Golden Hurter D on: 11/04/2014 08:30 AM   Modules accepted: Orders, Medications

## 2014-11-05 ENCOUNTER — Telehealth: Payer: Self-pay | Admitting: Cardiology

## 2014-11-05 NOTE — Telephone Encounter (Signed)
Received a call from patient he stated his B/P is still elevated today 172/84,186/91 pulse 72,86,yesterday 199/103,163/78 pulse 63,86.Stated he increased clonidine to 0.2 mg twice a day yesterday 11/04/14.Advised has not been long enough to notice a difference.Dr.Jordan out of office this afternoon will let him know 11/06/14.

## 2014-11-07 NOTE — Telephone Encounter (Signed)
Returned call to patient Dr.Jordan advised to continue to monitor B/P for at least 2 more weeks.Advised has not been long enough for B/P to come down,just increased clonidine on 11/04/14.Advised to call back to report B/P readings in 2 weeks.

## 2014-12-15 ENCOUNTER — Ambulatory Visit (INDEPENDENT_AMBULATORY_CARE_PROVIDER_SITE_OTHER): Payer: Medicare Other | Admitting: *Deleted

## 2014-12-15 DIAGNOSIS — I4891 Unspecified atrial fibrillation: Secondary | ICD-10-CM

## 2014-12-15 DIAGNOSIS — Z7901 Long term (current) use of anticoagulants: Secondary | ICD-10-CM | POA: Diagnosis not present

## 2014-12-15 DIAGNOSIS — Z5181 Encounter for therapeutic drug level monitoring: Secondary | ICD-10-CM

## 2014-12-15 LAB — POCT INR: INR: 1.8

## 2014-12-22 ENCOUNTER — Other Ambulatory Visit: Payer: Self-pay | Admitting: Cardiology

## 2015-01-05 ENCOUNTER — Ambulatory Visit (INDEPENDENT_AMBULATORY_CARE_PROVIDER_SITE_OTHER): Payer: Medicare Other | Admitting: *Deleted

## 2015-01-05 DIAGNOSIS — I4891 Unspecified atrial fibrillation: Secondary | ICD-10-CM

## 2015-01-05 DIAGNOSIS — Z7901 Long term (current) use of anticoagulants: Secondary | ICD-10-CM | POA: Diagnosis not present

## 2015-01-05 DIAGNOSIS — Z5181 Encounter for therapeutic drug level monitoring: Secondary | ICD-10-CM | POA: Diagnosis not present

## 2015-01-05 LAB — POCT INR: INR: 1.8

## 2015-01-20 ENCOUNTER — Other Ambulatory Visit: Payer: Self-pay | Admitting: Cardiology

## 2015-01-21 ENCOUNTER — Ambulatory Visit (INDEPENDENT_AMBULATORY_CARE_PROVIDER_SITE_OTHER): Payer: Medicare Other | Admitting: *Deleted

## 2015-01-21 DIAGNOSIS — Z7901 Long term (current) use of anticoagulants: Secondary | ICD-10-CM | POA: Diagnosis not present

## 2015-01-21 DIAGNOSIS — I4891 Unspecified atrial fibrillation: Secondary | ICD-10-CM

## 2015-01-21 DIAGNOSIS — Z5181 Encounter for therapeutic drug level monitoring: Secondary | ICD-10-CM | POA: Diagnosis not present

## 2015-01-21 LAB — POCT INR: INR: 1.6

## 2015-02-04 ENCOUNTER — Ambulatory Visit (INDEPENDENT_AMBULATORY_CARE_PROVIDER_SITE_OTHER): Payer: Medicare Other | Admitting: *Deleted

## 2015-02-04 DIAGNOSIS — Z7901 Long term (current) use of anticoagulants: Secondary | ICD-10-CM

## 2015-02-04 DIAGNOSIS — Z5181 Encounter for therapeutic drug level monitoring: Secondary | ICD-10-CM | POA: Diagnosis not present

## 2015-02-04 DIAGNOSIS — I4891 Unspecified atrial fibrillation: Secondary | ICD-10-CM | POA: Diagnosis not present

## 2015-02-04 LAB — POCT INR: INR: 2.4

## 2015-02-23 ENCOUNTER — Other Ambulatory Visit: Payer: Self-pay | Admitting: Cardiology

## 2015-03-13 ENCOUNTER — Encounter: Payer: Self-pay | Admitting: Cardiology

## 2015-03-13 ENCOUNTER — Ambulatory Visit (INDEPENDENT_AMBULATORY_CARE_PROVIDER_SITE_OTHER): Payer: Medicare Other | Admitting: Cardiology

## 2015-03-13 VITALS — BP 120/54 | HR 64 | Ht 69.0 in | Wt 215.3 lb

## 2015-03-13 DIAGNOSIS — Z7901 Long term (current) use of anticoagulants: Secondary | ICD-10-CM

## 2015-03-13 DIAGNOSIS — E785 Hyperlipidemia, unspecified: Secondary | ICD-10-CM | POA: Diagnosis not present

## 2015-03-13 DIAGNOSIS — I480 Paroxysmal atrial fibrillation: Secondary | ICD-10-CM | POA: Diagnosis not present

## 2015-03-13 DIAGNOSIS — I25118 Atherosclerotic heart disease of native coronary artery with other forms of angina pectoris: Secondary | ICD-10-CM | POA: Diagnosis not present

## 2015-03-13 DIAGNOSIS — I10 Essential (primary) hypertension: Secondary | ICD-10-CM

## 2015-03-13 NOTE — Patient Instructions (Signed)
Continue your current therapy  I will see you in 6 months.   

## 2015-03-13 NOTE — Progress Notes (Signed)
[name] Date of Birth: 01/25/1941   History of Present Illness: [name] is seen today for follow up CAD and atrial flutter.  He has a history of coronary disease with prior coronary bypass surgery in 2005. Last evaluation with cardiac catheterization in July of 2012 showed a patent LIMA graft to the LAD. The second marginal branch was occluded and the first marginal branch had a moderately severe stenosis. This is treated medically. His last Myoview study here in November 2013 showed a small apical fixed defect without ischemia. Ejection fraction 69%. He does have a history of paroxysmal atrial fibrillation and is on chronic Coumadin therapy. In February 2015 he had atrial flutter that resolved with IV Cardizem.  Echo in March 2015 showed a normal study except for mild diastolic dysfunction. In June he was visiting family in [place] when he developed severe HTN with BP increased to 218/118. He was admitted to the hospital in [facility], [place] and had extensive work up there. Results reviewed today as noted below. Of note he denies any symptoms with elevated BP. No medication changes made. BP OK since then. Denies chest pain, SOB, or palpitations.   Current Outpatient Prescriptions on File Prior to Visit  Medication Sig Dispense Refill  . acetaminophen (TYLENOL) 500 MG tablet Take 1,000 mg by mouth daily.     Marland Kitchen allopurinol (ZYLOPRIM) 300 MG tablet TAKE ONE TABLET BY MOUTH DAILY 30 tablet 0  . amLODipine-atorvastatin (CADUET) 10-40 MG per tablet TAKE 1 TABLET BY MOUTH DAILY. 30 tablet 1  . Ascorbic Acid (VITAMIN C) 500 MG tablet Take 1,000 mg by mouth daily.     Marland Kitchen b complex vitamins tablet Take 1 tablet by mouth daily.     . cloNIDine (CATAPRES) 0.2 MG tablet Take 1 tablet (0.2 mg total) by mouth 2 (two) times daily. 60 tablet 6  . fish oil-omega-3 fatty acids 1000 MG capsule Take 1 g by mouth 2 (two) times daily.     . furosemide (LASIX) 20 MG tablet TAKE 1 TABLET (20 MG TOTAL) BY MOUTH  DAILY.MUST KEEP JULY APPOINTMENT FOR FUTURE REFILLS 30 tablet 1  . GARLIC PO Take 956 mg by mouth daily.    Marland Kitchen GLIPIZIDE PO Take 5 mg by mouth daily.     . isosorbide mononitrate (IMDUR) 30 MG 24 hr tablet TAKE 1 TABLET BY MOUTH DAILY 30 tablet 5  . Lancets (ONETOUCH ULTRASOFT) lancets     . lisinopril (PRINIVIL,ZESTRIL) 40 MG tablet TAKE 1 TABLET BY MOUTH DAILY. 90 tablet 2  . metoprolol succinate (TOPROL-XL) 100 MG 24 hr tablet TAKE ONE TABLET BY MOUTH DAILY 90 tablet 0  . Multiple Vitamin (MULTIVITAMIN WITH MINERALS) TABS tablet Take 1 tablet by mouth daily.    . niacin (NIASPAN) 500 MG CR tablet TAKE 1 TABLET (500 MG TOTAL) BY MOUTH AT BEDTIME. 30 tablet 5  . NITROSTAT 0.4 MG SL tablet PLACE 1 TABLET (0.4 MG TOTAL) UNDER THE TONGUE EVERY 5  MINUTES AS NEEDED FOR CHEST PAIN. 100 tablet 1  . ONE TOUCH ULTRA TEST test strip     . traMADol (ULTRAM) 50 MG tablet Take 1 tablet (50 mg total) by mouth every 6 (six) hours as needed for moderate pain. 30 tablet 0  . warfarin (COUMADIN) 5 MG tablet TAKE 1 TABLET (5 MG TOTAL) BY MOUTH AS DIRECTED. 100 tablet 1   No current facility-administered medications on file prior to visit.    Allergies  Allergen Reactions  . Cephalexin  Rash    Past Medical History  Diagnosis Date  . Coronary artery disease   . Hypertension   . Diabetes mellitus   . Paroxysmal atrial fibrillation   . Current use of long term anticoagulation   . Dyslipidemia   . Edema   . Hyperlipidemia   . Gout   . History of acute myocardial infarction 11/2005    acute anteroseptal myocardial infarction  . Osteoarthritis   . Chest pain July 2012    s/p repeat cath; stable anatomy. Managed medically  . Wears glasses   . Full dentures     Past Surgical History  Procedure Laterality Date  . Coronary artery bypass graft  2005  . Cardiac catheterization  05/18/2009  . Cardiac catheterization  July 2012    Stable anatomy. Manage medically  . Tonsillectomy    . Colonoscopy     . Debridement and closure wound Left 06/18/2014    Procedure: CLOSURE OF OPEN WOUND OF LEFT CHEEK/POSSIBLE CHEEK ADVANCEMENT/ROTATION;  Surgeon: Theodoro Kos, DO;  Location: [place];  Service: Plastics;  Laterality: Left;  . Incision and drainage of wound Left 07/17/2014    Procedure: IRRIGATION AND DEBRIDEMENT LEFT CHEEK WOUND WITH PLACEMENT OF ACELL;  Surgeon: Theodoro Kos, DO;  Location: [place];  Service: Plastics;  Laterality: Left;    History  Smoking status  . Former Smoker  . Types: Cigarettes  . Quit date: 09/06/1987  Smokeless tobacco  . Never Used    History  Alcohol Use No    Family History  Problem Relation Age of Onset  . Coronary artery disease Mother   . Heart disease Mother   . Coronary artery disease Father   . Heart disease Father   . Cancer Brother   . Cancer Brother     Review of Systems: The review of systems is as above.  All other systems were reviewed and are negative.  Physical Exam: BP 120/54 mmHg  Pulse 64  Ht 5\' 9"  (1.753 m)  Wt 97.659 kg (215 lb 4.8 oz)  BMI 31.78 kg/m2 Patient is very pleasant and in no acute distress. Skin is warm and dry. Color is normal.  HEENT is unremarkable. Normocephalic/atraumatic. PERRL. Sclera are nonicteric. Surgical dressing left cheek. Neck is supple. No masses. No JVD. Lungs are clear. Cardiac exam shows a regular rate and rhythm. Abdomen is soft. Extremities reveal mild ankle edema.  Gait and ROM are intact. No gross neurologic deficits noted.  LABORATORY DATA:    Lab Results  Component Value Date   WBC 8.7 10/28/2014   HGB 14.9 10/28/2014   HCT 44.0 10/28/2014   PLT 220 10/28/2014   GLUCOSE 128* 10/28/2014   CHOL 135 04/02/2012   TRIG 130.0 04/02/2012   HDL 41.30 04/02/2012   LDLCALC 68 04/02/2012   ALT 28 03/18/2011   AST 27 03/18/2011   NA 136 10/28/2014   K 3.7 10/28/2014   CL 102 10/28/2014   CREATININE 0.95 10/28/2014   BUN 16 10/28/2014   CO2 26  10/28/2014   TSH 0.882 03/19/2011   INR 2.4 02/04/2015   HGBA1C * 05/16/2009    6.3 (NOTE) The ADA recommends the following therapeutic goal for glycemic control related to Hgb A1c measurement: Goal of therapy: <6.5 Hgb A1c  Reference: American Diabetes Association: Clinical Practice Recommendations 2010, Diabetes Care, 2010, 33: (Suppl  1).   Records reviewed from [facility] hospital: Pertinent findings: INR 2.7. CBC normal Glucose 147. Other chemistries normal  Cholesterol-98, trig-138,HDL-34, LDL 36. CKMB and troponin normal UA normal BNP 95.  Renal ultrasound and duplex- Normal without renal artery stenosis. Hepatic steatosis noted.   Lexiscan myoview: fixed inferior wall defect. No ischemia. EF 68%. Low risk.  Echo: Normal LV function. Mild LAE. Normal valves.    Assessment / Plan:  1. Coronary disease status post CABG. Vein graft to the OM is known to be occluded. He is minimally symptomatic on medical therapy with dyspnea on incline. Myoview recently in [place] without ischemia and normal EF.  He will continue with his medical therapy including metoprolol, isosorbide, and amlodipine.  2. Atrial fibrillation/flutter. He is on chronic Coumadin therapy. He remains in sinus rhythm at this time. We'll continue with rate control with metoprolol.   3. Hyperlipidemia. On statin therapy and fish oil.  4. HTN with recent increase. Now BP well controlled. Renal artery duplex without stenosis. Continue current meds. If BP jumps up again instructed him to take extra 0.1 mg of clonidine.   5. 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

## 2015-03-23 ENCOUNTER — Ambulatory Visit (INDEPENDENT_AMBULATORY_CARE_PROVIDER_SITE_OTHER): Payer: Medicare Other | Admitting: *Deleted

## 2015-03-23 DIAGNOSIS — Z7901 Long term (current) use of anticoagulants: Secondary | ICD-10-CM

## 2015-03-23 DIAGNOSIS — I480 Paroxysmal atrial fibrillation: Secondary | ICD-10-CM

## 2015-03-23 DIAGNOSIS — Z5181 Encounter for therapeutic drug level monitoring: Secondary | ICD-10-CM

## 2015-03-23 DIAGNOSIS — I4891 Unspecified atrial fibrillation: Secondary | ICD-10-CM

## 2015-03-23 LAB — POCT INR: INR: 2.3

## 2015-03-26 ENCOUNTER — Other Ambulatory Visit: Payer: Self-pay | Admitting: Cardiology

## 2015-03-26 NOTE — Telephone Encounter (Signed)
COUMADIN REFILL. THANK YOU FOR YOUR TIME.  

## 2015-04-01 ENCOUNTER — Other Ambulatory Visit: Payer: Self-pay | Admitting: Cardiology

## 2015-04-16 ENCOUNTER — Ambulatory Visit (INDEPENDENT_AMBULATORY_CARE_PROVIDER_SITE_OTHER): Payer: Medicare Other | Admitting: *Deleted

## 2015-04-16 DIAGNOSIS — I480 Paroxysmal atrial fibrillation: Secondary | ICD-10-CM

## 2015-04-16 DIAGNOSIS — Z7901 Long term (current) use of anticoagulants: Secondary | ICD-10-CM

## 2015-04-16 DIAGNOSIS — I4891 Unspecified atrial fibrillation: Secondary | ICD-10-CM | POA: Diagnosis not present

## 2015-04-16 DIAGNOSIS — Z5181 Encounter for therapeutic drug level monitoring: Secondary | ICD-10-CM

## 2015-04-16 LAB — POCT INR: INR: 2

## 2015-05-18 ENCOUNTER — Ambulatory Visit (INDEPENDENT_AMBULATORY_CARE_PROVIDER_SITE_OTHER): Payer: Medicare Other | Admitting: *Deleted

## 2015-05-18 DIAGNOSIS — I4891 Unspecified atrial fibrillation: Secondary | ICD-10-CM

## 2015-05-18 DIAGNOSIS — Z5181 Encounter for therapeutic drug level monitoring: Secondary | ICD-10-CM | POA: Diagnosis not present

## 2015-05-18 DIAGNOSIS — I480 Paroxysmal atrial fibrillation: Secondary | ICD-10-CM

## 2015-05-18 DIAGNOSIS — Z7901 Long term (current) use of anticoagulants: Secondary | ICD-10-CM | POA: Diagnosis not present

## 2015-05-18 LAB — POCT INR: INR: 2.5

## 2015-05-30 ENCOUNTER — Other Ambulatory Visit: Payer: Self-pay | Admitting: Cardiology

## 2015-06-06 ENCOUNTER — Other Ambulatory Visit: Payer: Self-pay | Admitting: Cardiology

## 2015-06-08 NOTE — Telephone Encounter (Deleted)
May we refill? 

## 2015-06-08 NOTE — Telephone Encounter (Signed)
OK to refill Clonidine  Peter Martinique MD, [facility]

## 2015-06-10 NOTE — Telephone Encounter (Signed)
Clonidine refilled 06/08/15.

## 2015-06-29 ENCOUNTER — Ambulatory Visit (INDEPENDENT_AMBULATORY_CARE_PROVIDER_SITE_OTHER): Payer: Medicare Other

## 2015-06-29 DIAGNOSIS — Z7901 Long term (current) use of anticoagulants: Secondary | ICD-10-CM

## 2015-06-29 DIAGNOSIS — I4891 Unspecified atrial fibrillation: Secondary | ICD-10-CM | POA: Diagnosis not present

## 2015-06-29 DIAGNOSIS — I480 Paroxysmal atrial fibrillation: Secondary | ICD-10-CM

## 2015-06-29 DIAGNOSIS — Z5181 Encounter for therapeutic drug level monitoring: Secondary | ICD-10-CM

## 2015-06-29 LAB — POCT INR: INR: 2.4

## 2015-07-14 ENCOUNTER — Telehealth: Payer: Self-pay

## 2015-07-14 NOTE — Telephone Encounter (Signed)
Received walk in form requesting a letter to drive a commercial vehicle.Letter signed by Dr.Jordan and left at front desk of [facility] office for pick up.

## 2015-08-10 ENCOUNTER — Ambulatory Visit (INDEPENDENT_AMBULATORY_CARE_PROVIDER_SITE_OTHER): Payer: Medicare Other | Admitting: *Deleted

## 2015-08-10 DIAGNOSIS — Z5181 Encounter for therapeutic drug level monitoring: Secondary | ICD-10-CM | POA: Diagnosis not present

## 2015-08-10 DIAGNOSIS — Z7901 Long term (current) use of anticoagulants: Secondary | ICD-10-CM

## 2015-08-10 DIAGNOSIS — I480 Paroxysmal atrial fibrillation: Secondary | ICD-10-CM

## 2015-08-10 DIAGNOSIS — I4891 Unspecified atrial fibrillation: Secondary | ICD-10-CM

## 2015-08-10 LAB — POCT INR: INR: 2.3

## 2015-08-24 ENCOUNTER — Other Ambulatory Visit: Payer: Self-pay | Admitting: Cardiology

## 2015-09-21 ENCOUNTER — Ambulatory Visit (INDEPENDENT_AMBULATORY_CARE_PROVIDER_SITE_OTHER): Payer: Medicare Other | Admitting: Pharmacist Clinician (PhC)/ Clinical Pharmacy Specialist

## 2015-09-21 ENCOUNTER — Encounter: Payer: Self-pay | Admitting: Cardiology

## 2015-09-21 ENCOUNTER — Ambulatory Visit (INDEPENDENT_AMBULATORY_CARE_PROVIDER_SITE_OTHER): Payer: Medicare Other | Admitting: Cardiology

## 2015-09-21 VITALS — BP 118/70 | HR 63 | Ht 69.5 in | Wt 212.0 lb

## 2015-09-21 DIAGNOSIS — I480 Paroxysmal atrial fibrillation: Secondary | ICD-10-CM | POA: Diagnosis not present

## 2015-09-21 DIAGNOSIS — I4891 Unspecified atrial fibrillation: Secondary | ICD-10-CM

## 2015-09-21 DIAGNOSIS — Z7901 Long term (current) use of anticoagulants: Secondary | ICD-10-CM

## 2015-09-21 DIAGNOSIS — I10 Essential (primary) hypertension: Secondary | ICD-10-CM | POA: Diagnosis not present

## 2015-09-21 DIAGNOSIS — Z5181 Encounter for therapeutic drug level monitoring: Secondary | ICD-10-CM

## 2015-09-21 DIAGNOSIS — I2510 Atherosclerotic heart disease of native coronary artery without angina pectoris: Secondary | ICD-10-CM

## 2015-09-21 DIAGNOSIS — E785 Hyperlipidemia, unspecified: Secondary | ICD-10-CM

## 2015-09-21 LAB — POCT INR: INR: 2

## 2015-09-21 MED ORDER — CLONIDINE HCL 0.1 MG PO TABS: ORAL_TABLET | ORAL | Status: DC

## 2015-09-21 MED ORDER — CLONIDINE HCL 0.2 MG PO TABS: ORAL_TABLET | ORAL | Status: DC

## 2015-09-21 MED ORDER — FUROSEMIDE 20 MG PO TABS: ORAL_TABLET | ORAL | Status: DC

## 2015-09-21 MED ORDER — ISOSORBIDE MONONITRATE ER 30 MG PO TB24: 30.0000 mg | ORAL_TABLET | Freq: Every day | ORAL | Status: DC

## 2015-09-21 MED ORDER — METOPROLOL SUCCINATE ER 100 MG PO TB24: 100.0000 mg | ORAL_TABLET | Freq: Every day | ORAL | Status: DC

## 2015-09-21 MED ORDER — LISINOPRIL 40 MG PO TABS: 40.0000 mg | ORAL_TABLET | Freq: Every day | ORAL | Status: DC

## 2015-09-21 MED ORDER — AMLODIPINE-ATORVASTATIN 10-40 MG PO TABS: 1.0000 | ORAL_TABLET | Freq: Every day | ORAL | Status: DC

## 2015-09-21 NOTE — Progress Notes (Signed)
[name] Date of Birth: 05/04/1941   History of Present Illness: [name] is seen today for follow up CAD and atrial flutter.  He has a history of coronary disease with prior coronary bypass surgery in 2005. Last evaluation with cardiac catheterization in July of 2012 showed a patent LIMA graft to the LAD. The second marginal branch was occluded and the first marginal branch had a moderately severe stenosis. This is treated medically. His last Myoview study here in November 2013 showed a small apical fixed defect without ischemia. Ejection fraction 69%. He does have a history of paroxysmal atrial fibrillation and is on chronic Coumadin therapy. In February 2015 he had atrial flutter that resolved with IV Cardizem.  Echo in March 2015 showed a normal study except for mild diastolic dysfunction. In June 2016 he was visiting family in [place] when he developed severe HTN with BP increased to 218/118. He was admitted to the hospital in [facility], [place] and had extensive work up there. Myoview study showed a fixed inferior defect without ischemia and normal EF. Echo was normal.   On follow up today he notes BP will jump up at times. Up to 200. Takes an extra half of Clonidine when needed. No chest pain or SOB. No bleeding problems.  Current Outpatient Prescriptions on File Prior to Visit  Medication Sig Dispense Refill  . acetaminophen (TYLENOL) 500 MG tablet Take 1,000 mg by mouth daily.     Marland Kitchen allopurinol (ZYLOPRIM) 300 MG tablet TAKE ONE TABLET BY MOUTH DAILY 30 tablet 0  . amLODipine-atorvastatin (CADUET) 10-40 MG per tablet TAKE 1 TABLET BY MOUTH DAILY. 30 tablet 1  . Ascorbic Acid (VITAMIN C) 500 MG tablet Take 1,000 mg by mouth daily.     Marland Kitchen b complex vitamins tablet Take 1 tablet by mouth daily.     . cloNIDine (CATAPRES) 0.2 MG tablet Take 1 tablet (0.2 mg total) by mouth 2 (two) times daily. 60 tablet 6  . fish oil-omega-3 fatty acids 1000 MG capsule Take 1 g by mouth 2 (two) times  daily.     . furosemide (LASIX) 20 MG tablet TAKE 1 TABLET (20 MG TOTAL) BY MOUTH DAILY.MUST KEEP JULY APPOINTMENT FOR FUTURE REFILLS 30 tablet 1  . GARLIC PO Take [id number] mg by mouth daily.    Marland Kitchen GLIPIZIDE PO Take 5 mg by mouth daily.     . isosorbide mononitrate (IMDUR) 30 MG 24 hr tablet TAKE 1 TABLET BY MOUTH DAILY 30 tablet 5  . Lancets (ONETOUCH ULTRASOFT) lancets     . lisinopril (PRINIVIL,ZESTRIL) 40 MG tablet TAKE 1 TABLET BY MOUTH DAILY. 90 tablet 2  . metoprolol succinate (TOPROL-XL) 100 MG 24 hr tablet TAKE ONE TABLET BY MOUTH DAILY 90 tablet 0  . Multiple Vitamin (MULTIVITAMIN WITH MINERALS) TABS tablet Take 1 tablet by mouth daily.    . niacin (NIASPAN) 500 MG CR tablet TAKE 1 TABLET (500 MG TOTAL) BY MOUTH AT BEDTIME. 30 tablet 5  . NITROSTAT 0.4 MG SL tablet PLACE 1 TABLET (0.4 MG TOTAL) UNDER THE TONGUE EVERY 5  MINUTES AS NEEDED FOR CHEST PAIN. 100 tablet 1  . ONE TOUCH ULTRA TEST test strip     . traMADol (ULTRAM) 50 MG tablet Take 1 tablet (50 mg total) by mouth every 6 (six) hours as needed for moderate pain. 30 tablet 0  . warfarin (COUMADIN) 5 MG tablet TAKE 1 TABLET (5 MG TOTAL) BY MOUTH AS DIRECTED. 100 tablet 1  No current facility-administered medications on file prior to visit.    Allergies  Allergen Reactions  . Cephalexin Rash    Past Medical History  Diagnosis Date  . Coronary artery disease   . Hypertension   . Diabetes mellitus   . Paroxysmal atrial fibrillation (HCC)   . Current use of long term anticoagulation   . Dyslipidemia   . Edema   . Hyperlipidemia   . Gout   . History of acute myocardial infarction 11/2005    acute anteroseptal myocardial infarction  . Osteoarthritis   . Chest pain July 2012    s/p repeat cath; stable anatomy. Managed medically  . Wears glasses   . Full dentures     Past Surgical History  Procedure Laterality Date  . Coronary artery bypass graft  2005  . Cardiac catheterization  05/18/2009  . Cardiac catheterization   July 2012    Stable anatomy. Manage medically  . Tonsillectomy    . Colonoscopy    . Debridement and closure wound Left 06/18/2014    Procedure: CLOSURE OF OPEN WOUND OF LEFT CHEEK/POSSIBLE CHEEK ADVANCEMENT/ROTATION;  Surgeon: Theodoro Kos, DO;  Location: [place];  Service: Plastics;  Laterality: Left;  . Incision and drainage of wound Left 07/17/2014    Procedure: IRRIGATION AND DEBRIDEMENT LEFT CHEEK WOUND WITH PLACEMENT OF ACELL;  Surgeon: Theodoro Kos, DO;  Location: [place];  Service: Plastics;  Laterality: Left;    History  Smoking status  . Former Smoker  . Types: Cigarettes  . Quit date: 09/06/1987  Smokeless tobacco  . Never Used    History  Alcohol Use No    Family History  Problem Relation Age of Onset  . Coronary artery disease Mother   . Heart disease Mother   . Coronary artery disease Father   . Heart disease Father   . Cancer Brother   . Cancer Brother     Review of Systems: The review of systems is as above.  All other systems were reviewed and are negative.  Physical Exam: BP 118/70 mmHg  Pulse 63  Ht 5' 9.5" (1.765 m)  Wt 96.163 kg (212 lb)  BMI 30.87 kg/m2 Patient is very pleasant and in no acute distress. Skin is warm and dry. Color is normal.  HEENT is unremarkable. Normocephalic/atraumatic. PERRL. Sclera are nonicteric. Surgical dressing left cheek. Neck is supple. No masses. No JVD. Lungs are clear. Cardiac exam shows a regular rate and rhythm. Abdomen is soft. Extremities reveal mild ankle edema.  Gait and ROM are intact. No gross neurologic deficits noted.  LABORATORY DATA:    Lab Results  Component Value Date   WBC 8.7 10/28/2014   HGB 14.9 10/28/2014   HCT 44.0 10/28/2014   PLT 220 10/28/2014   GLUCOSE 128* 10/28/2014   CHOL 135 04/02/2012   TRIG 130.0 04/02/2012   HDL 41.30 04/02/2012   LDLCALC 68 04/02/2012   ALT 28 03/18/2011   AST 27 03/18/2011   NA 136 10/28/2014   K 3.7 10/28/2014    CL 102 10/28/2014   CREATININE 0.95 10/28/2014   BUN 16 10/28/2014   CO2 26 10/28/2014   TSH 0.882 03/19/2011   INR 2 09/21/2015   HGBA1C * 05/16/2009    6.3 (NOTE) The ADA recommends the following therapeutic goal for glycemic control related to Hgb A1c measurement: Goal of therapy: <6.5 Hgb A1c  Reference: American Diabetes Association: Clinical Practice Recommendations 2010, Diabetes Care, 2010, 33: (Suppl  1).  INR today: 2.0  Ecg today shows NSR with normal Ecg. I have personally reviewed and interpreted this study.   Assessment / Plan:  1. Coronary disease status post CABG. Vein graft to the OM is known to be occluded. He is not symptomatic on medical therapy. Myoview June 2016 without ischemia and normal EF.  He will continue with his medical therapy including metoprolol, isosorbide, and amlodipine.  2. Atrial fibrillation/flutter. He is on chronic Coumadin therapy. He remains in sinus rhythm at this time. We'll continue with rate control with metoprolol.   3. Hyperlipidemia. On statin therapy and fish oil.  4. HTN with recent increase. Now BP well controlled today. Renal artery duplex without stenosis. Continue current meds. If BP increases instructed him to take extra 0.1 mg of clonidine.   5. 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

## 2015-09-21 NOTE — Patient Instructions (Signed)
Continue your current therapy  I will see you in 6 months.   

## 2015-09-23 ENCOUNTER — Telehealth: Payer: Self-pay

## 2015-09-23 DIAGNOSIS — E785 Hyperlipidemia, unspecified: Secondary | ICD-10-CM

## 2015-09-23 DIAGNOSIS — I480 Paroxysmal atrial fibrillation: Secondary | ICD-10-CM

## 2015-09-23 DIAGNOSIS — I10 Essential (primary) hypertension: Secondary | ICD-10-CM

## 2015-09-23 DIAGNOSIS — Z7901 Long term (current) use of anticoagulants: Secondary | ICD-10-CM

## 2015-09-23 DIAGNOSIS — I2510 Atherosclerotic heart disease of native coronary artery without angina pectoris: Secondary | ICD-10-CM

## 2015-09-23 MED ORDER — LISINOPRIL 40 MG PO TABS: 40.0000 mg | ORAL_TABLET | Freq: Every day | ORAL | Status: DC

## 2015-09-23 MED ORDER — ALLOPURINOL 300 MG PO TABS: 300.0000 mg | ORAL_TABLET | Freq: Every day | ORAL | Status: DC

## 2015-09-23 NOTE — Telephone Encounter (Signed)
Patient said that his insurance does not cover the cost of Amlodioine-Atorvastatin and needs a comparable RX's that his insurance will cover

## 2015-09-24 MED ORDER — AMLODIPINE BESYLATE 10 MG PO TABS: 10.0000 mg | ORAL_TABLET | Freq: Every day | ORAL | Status: DC

## 2015-09-24 MED ORDER — ATORVASTATIN CALCIUM 40 MG PO TABS: 40.0000 mg | ORAL_TABLET | Freq: Every day | ORAL | Status: DC

## 2015-09-24 NOTE — Telephone Encounter (Signed)
Send 2 separate prescriptions, atorvastatin 40 mg qhs and amlodipine 10 mg qd.  They should be covered individually, he was on the combination Caduet tablet

## 2015-09-24 NOTE — Telephone Encounter (Signed)
Left voice mail message in identified line to let him knos that I sent in the RX for atorvastatin 40 mg to take at bed time and the Amlodipine  10 mg to take every day

## 2015-09-24 NOTE — Addendum Note (Signed)
Addended by: Carilyn Goodpasture on: 09/24/2015 04:54 PM   Modules accepted: Orders, Medications

## 2015-11-02 ENCOUNTER — Ambulatory Visit (INDEPENDENT_AMBULATORY_CARE_PROVIDER_SITE_OTHER): Payer: Medicare Other | Admitting: *Deleted

## 2015-11-02 DIAGNOSIS — Z7901 Long term (current) use of anticoagulants: Secondary | ICD-10-CM

## 2015-11-02 DIAGNOSIS — Z5181 Encounter for therapeutic drug level monitoring: Secondary | ICD-10-CM | POA: Diagnosis not present

## 2015-11-02 DIAGNOSIS — I480 Paroxysmal atrial fibrillation: Secondary | ICD-10-CM | POA: Diagnosis not present

## 2015-11-02 DIAGNOSIS — I4891 Unspecified atrial fibrillation: Secondary | ICD-10-CM

## 2015-11-02 LAB — POCT INR: INR: 1.8

## 2015-11-16 ENCOUNTER — Ambulatory Visit (INDEPENDENT_AMBULATORY_CARE_PROVIDER_SITE_OTHER): Payer: Medicare Other | Admitting: *Deleted

## 2015-11-16 DIAGNOSIS — I480 Paroxysmal atrial fibrillation: Secondary | ICD-10-CM

## 2015-11-16 DIAGNOSIS — I4891 Unspecified atrial fibrillation: Secondary | ICD-10-CM

## 2015-11-16 DIAGNOSIS — Z7901 Long term (current) use of anticoagulants: Secondary | ICD-10-CM

## 2015-11-16 DIAGNOSIS — Z5181 Encounter for therapeutic drug level monitoring: Secondary | ICD-10-CM | POA: Diagnosis not present

## 2015-11-16 LAB — POCT INR: INR: 1.9

## 2015-11-20 ENCOUNTER — Other Ambulatory Visit: Payer: Self-pay | Admitting: Cardiology

## 2015-11-20 NOTE — Telephone Encounter (Signed)
Rx request sent to pharmacy.  

## 2015-11-30 ENCOUNTER — Ambulatory Visit (INDEPENDENT_AMBULATORY_CARE_PROVIDER_SITE_OTHER): Payer: Medicare Other | Admitting: *Deleted

## 2015-11-30 DIAGNOSIS — I4891 Unspecified atrial fibrillation: Secondary | ICD-10-CM | POA: Diagnosis not present

## 2015-11-30 DIAGNOSIS — Z5181 Encounter for therapeutic drug level monitoring: Secondary | ICD-10-CM

## 2015-11-30 DIAGNOSIS — I480 Paroxysmal atrial fibrillation: Secondary | ICD-10-CM | POA: Diagnosis not present

## 2015-11-30 DIAGNOSIS — Z7901 Long term (current) use of anticoagulants: Secondary | ICD-10-CM | POA: Diagnosis not present

## 2015-11-30 LAB — POCT INR: INR: 1.8

## 2015-12-14 ENCOUNTER — Ambulatory Visit (INDEPENDENT_AMBULATORY_CARE_PROVIDER_SITE_OTHER): Payer: Medicare Other

## 2015-12-14 DIAGNOSIS — Z7901 Long term (current) use of anticoagulants: Secondary | ICD-10-CM

## 2015-12-14 DIAGNOSIS — Z5181 Encounter for therapeutic drug level monitoring: Secondary | ICD-10-CM

## 2015-12-14 DIAGNOSIS — I4891 Unspecified atrial fibrillation: Secondary | ICD-10-CM

## 2015-12-14 DIAGNOSIS — I480 Paroxysmal atrial fibrillation: Secondary | ICD-10-CM | POA: Diagnosis not present

## 2015-12-14 LAB — POCT INR: INR: 2.1

## 2015-12-23 ENCOUNTER — Other Ambulatory Visit: Payer: Self-pay | Admitting: Cardiology

## 2015-12-28 ENCOUNTER — Other Ambulatory Visit: Payer: Self-pay | Admitting: Cardiology

## 2016-01-04 ENCOUNTER — Ambulatory Visit (INDEPENDENT_AMBULATORY_CARE_PROVIDER_SITE_OTHER): Payer: Medicare Other | Admitting: *Deleted

## 2016-01-04 DIAGNOSIS — Z5181 Encounter for therapeutic drug level monitoring: Secondary | ICD-10-CM

## 2016-01-04 DIAGNOSIS — Z7901 Long term (current) use of anticoagulants: Secondary | ICD-10-CM | POA: Diagnosis not present

## 2016-01-04 DIAGNOSIS — I480 Paroxysmal atrial fibrillation: Secondary | ICD-10-CM

## 2016-01-04 DIAGNOSIS — I4891 Unspecified atrial fibrillation: Secondary | ICD-10-CM | POA: Diagnosis not present

## 2016-01-04 LAB — POCT INR: INR: 2.3

## 2016-02-04 ENCOUNTER — Ambulatory Visit (INDEPENDENT_AMBULATORY_CARE_PROVIDER_SITE_OTHER): Payer: Medicare Other | Admitting: *Deleted

## 2016-02-04 DIAGNOSIS — Z5181 Encounter for therapeutic drug level monitoring: Secondary | ICD-10-CM | POA: Diagnosis not present

## 2016-02-04 DIAGNOSIS — I480 Paroxysmal atrial fibrillation: Secondary | ICD-10-CM | POA: Diagnosis not present

## 2016-02-04 DIAGNOSIS — Z7901 Long term (current) use of anticoagulants: Secondary | ICD-10-CM

## 2016-02-04 DIAGNOSIS — I4891 Unspecified atrial fibrillation: Secondary | ICD-10-CM | POA: Diagnosis not present

## 2016-02-04 LAB — POCT INR: INR: 2.7

## 2016-02-29 ENCOUNTER — Ambulatory Visit (INDEPENDENT_AMBULATORY_CARE_PROVIDER_SITE_OTHER): Payer: Medicare Other | Admitting: *Deleted

## 2016-02-29 DIAGNOSIS — Z5181 Encounter for therapeutic drug level monitoring: Secondary | ICD-10-CM

## 2016-02-29 DIAGNOSIS — I480 Paroxysmal atrial fibrillation: Secondary | ICD-10-CM | POA: Diagnosis not present

## 2016-02-29 DIAGNOSIS — I4891 Unspecified atrial fibrillation: Secondary | ICD-10-CM | POA: Diagnosis not present

## 2016-02-29 DIAGNOSIS — Z7901 Long term (current) use of anticoagulants: Secondary | ICD-10-CM

## 2016-02-29 LAB — POCT INR: INR: 1.8

## 2016-03-14 ENCOUNTER — Ambulatory Visit (INDEPENDENT_AMBULATORY_CARE_PROVIDER_SITE_OTHER): Payer: Medicare Other

## 2016-03-14 DIAGNOSIS — Z7901 Long term (current) use of anticoagulants: Secondary | ICD-10-CM

## 2016-03-14 DIAGNOSIS — I4891 Unspecified atrial fibrillation: Secondary | ICD-10-CM

## 2016-03-14 DIAGNOSIS — Z5181 Encounter for therapeutic drug level monitoring: Secondary | ICD-10-CM

## 2016-03-14 LAB — POCT INR: INR: 2.3

## 2016-04-04 ENCOUNTER — Ambulatory Visit (INDEPENDENT_AMBULATORY_CARE_PROVIDER_SITE_OTHER): Payer: Medicare Other | Admitting: *Deleted

## 2016-04-04 ENCOUNTER — Encounter (INDEPENDENT_AMBULATORY_CARE_PROVIDER_SITE_OTHER): Payer: Self-pay

## 2016-04-04 DIAGNOSIS — I4891 Unspecified atrial fibrillation: Secondary | ICD-10-CM

## 2016-04-04 DIAGNOSIS — Z5181 Encounter for therapeutic drug level monitoring: Secondary | ICD-10-CM | POA: Diagnosis not present

## 2016-04-04 DIAGNOSIS — Z7901 Long term (current) use of anticoagulants: Secondary | ICD-10-CM

## 2016-04-04 LAB — POCT INR: INR: 2.3

## 2016-04-28 NOTE — Progress Notes (Signed)
[name] Date of Birth: 1940-10-11   History of Present Illness: [name] is seen today for follow up CAD and atrial flutter.  He has a history of coronary disease with prior coronary bypass surgery in 2005. Last evaluation with cardiac catheterization in July of 2012 showed a patent LIMA graft to the LAD. The second marginal branch was occluded and the first marginal branch had a moderately severe stenosis. This is treated medically. His last Myoview study here in November 2013 showed a small apical fixed defect without ischemia. Ejection fraction 69%. He does have a history of paroxysmal atrial fibrillation and is on chronic Coumadin therapy. In February 2015 he had atrial flutter that resolved with IV Cardizem.  Echo in March 2015 showed a normal study except for mild diastolic dysfunction. In June 2016 he was visiting family in [place] when he developed severe HTN with BP increased to 218/118. He was admitted to the hospital in [facility], [place] and had extensive work up there. Myoview study showed a fixed inferior defect without ischemia and normal EF. Echo was normal.   On follow up today he notes he is doing well. He had a couple of fluttering episodes lasting at most 10 minutes. Had a slight pain in upper left chest last week while watching TV. No other chest pain. BP has been doing well. Typically 140-150 in the am then goes down after taking meds. Rarely has to take Clonidine if BP shoots up. He is walking some. Has gained 6 lbs.   Current Outpatient Prescriptions on File Prior to Visit  Medication Sig Dispense Refill  . acetaminophen (TYLENOL) 500 MG tablet Take 1,000 mg by mouth daily.     Marland Kitchen allopurinol (ZYLOPRIM) 300 MG tablet TAKE ONE TABLET BY MOUTH DAILY 30 tablet 0  . amLODipine-atorvastatin (CADUET) 10-40 MG per tablet TAKE 1 TABLET BY MOUTH DAILY. 30 tablet 1  . Ascorbic Acid (VITAMIN C) 500 MG tablet Take 1,000 mg by mouth daily.     Marland Kitchen b complex vitamins tablet Take 1  tablet by mouth daily.     . cloNIDine (CATAPRES) 0.2 MG tablet Take 1 tablet (0.2 mg total) by mouth 2 (two) times daily. 60 tablet 6  . fish oil-omega-3 fatty acids 1000 MG capsule Take 1 g by mouth 2 (two) times daily.     . furosemide (LASIX) 20 MG tablet TAKE 1 TABLET (20 MG TOTAL) BY MOUTH DAILY.MUST KEEP JULY APPOINTMENT FOR FUTURE REFILLS 30 tablet 1  . GARLIC PO Take [id number] mg by mouth daily.    Marland Kitchen GLIPIZIDE PO Take 5 mg by mouth daily.     . isosorbide mononitrate (IMDUR) 30 MG 24 hr tablet TAKE 1 TABLET BY MOUTH DAILY 30 tablet 5  . Lancets (ONETOUCH ULTRASOFT) lancets     . lisinopril (PRINIVIL,ZESTRIL) 40 MG tablet TAKE 1 TABLET BY MOUTH DAILY. 90 tablet 2  . metoprolol succinate (TOPROL-XL) 100 MG 24 hr tablet TAKE ONE TABLET BY MOUTH DAILY 90 tablet 0  . Multiple Vitamin (MULTIVITAMIN WITH MINERALS) TABS tablet Take 1 tablet by mouth daily.    . niacin (NIASPAN) 500 MG CR tablet TAKE 1 TABLET (500 MG TOTAL) BY MOUTH AT BEDTIME. 30 tablet 5  . NITROSTAT 0.4 MG SL tablet PLACE 1 TABLET (0.4 MG TOTAL) UNDER THE TONGUE EVERY 5  MINUTES AS NEEDED FOR CHEST PAIN. 100 tablet 1  . ONE TOUCH ULTRA TEST test strip     . traMADol (ULTRAM) 50 MG tablet  Take 1 tablet (50 mg total) by mouth every 6 (six) hours as needed for moderate pain. 30 tablet 0  . warfarin (COUMADIN) 5 MG tablet TAKE 1 TABLET (5 MG TOTAL) BY MOUTH AS DIRECTED. 100 tablet 1   No current facility-administered medications on file prior to visit.    Allergies  Allergen Reactions  . Cephalexin Rash    Past Medical History:  Diagnosis Date  . Chest pain July 2012   s/p repeat cath; stable anatomy. Managed medically  . Coronary artery disease   . Current use of long term anticoagulation   . Diabetes mellitus   . Dyslipidemia   . Edema   . Full dentures   . Gout   . History of acute myocardial infarction 11/2005   acute anteroseptal myocardial infarction  . Hyperlipidemia   . Hypertension   . Osteoarthritis   .  Paroxysmal atrial fibrillation (HCC)   . Wears glasses     Past Surgical History:  Procedure Laterality Date  . CARDIAC CATHETERIZATION  05/18/2009  . CARDIAC CATHETERIZATION  July 2012   Stable anatomy. Manage medically  . COLONOSCOPY    . CORONARY ARTERY BYPASS GRAFT  2005  . DEBRIDEMENT AND CLOSURE WOUND Left 06/18/2014   Procedure: CLOSURE OF OPEN WOUND OF LEFT CHEEK/POSSIBLE CHEEK ADVANCEMENT/ROTATION;  Surgeon: Theodoro Kos, DO;  Location: [place];  Service: Plastics;  Laterality: Left;  . INCISION AND DRAINAGE OF WOUND Left 07/17/2014   Procedure: IRRIGATION AND DEBRIDEMENT LEFT CHEEK WOUND WITH PLACEMENT OF ACELL;  Surgeon: Theodoro Kos, DO;  Location: [place];  Service: Plastics;  Laterality: Left;  . TONSILLECTOMY      History  Smoking Status  . Former Smoker  . Types: Cigarettes  . Quit date: 09/06/1987  Smokeless Tobacco  . Never Used    History  Alcohol Use No    Family History  Problem Relation Age of Onset  . Coronary artery disease Mother   . Heart disease Mother   . Coronary artery disease Father   . Heart disease Father   . Cancer Brother   . Cancer Brother     Review of Systems: The review of systems is as above.  All other systems were reviewed and are negative.  Physical Exam: BP 124/60 (BP Location: Right Arm, Patient Position: Sitting, Cuff Size: Normal)   Pulse 68   Ht 5\' 9"  (1.753 m)   Wt 218 lb (98.9 kg)   BMI 32.19 kg/m  Patient is very pleasant and in no acute distress. Skin is warm and dry. Color is normal.  HEENT is unremarkable. Normocephalic/atraumatic. PERRL. Sclera are nonicteric. Surgical dressing left cheek. Neck is supple. No masses. No JVD. Lungs are clear. Cardiac exam shows a regular rate and rhythm. Abdomen is soft. Extremities reveal mild ankle edema.  Gait and ROM are intact. No gross neurologic deficits noted.  LABORATORY DATA:    Lab Results  Component Value Date   WBC 8.7  10/28/2014   HGB 14.9 10/28/2014   HCT 44.0 10/28/2014   PLT 220 10/28/2014   GLUCOSE 128 (H) 10/28/2014   CHOL 135 04/02/2012   TRIG 130.0 04/02/2012   HDL 41.30 04/02/2012   LDLCALC 68 04/02/2012   ALT 28 03/18/2011   AST 27 03/18/2011   NA 136 10/28/2014   K 3.7 10/28/2014   CL 102 10/28/2014   CREATININE 0.95 10/28/2014   BUN 16 10/28/2014   CO2 26 10/28/2014   TSH 0.882 03/19/2011  INR 2.3 04/04/2016   HGBA1C (H) 05/16/2009    6.3 (NOTE) The ADA recommends the following therapeutic goal for glycemic control related to Hgb A1c measurement: Goal of therapy: <6.5 Hgb A1c  Reference: American Diabetes Association: Clinical Practice Recommendations 2010, Diabetes Care, 2010, 33: (Suppl  1).   INR today: pending  Ecg today shows NSR with normal Ecg. I have personally reviewed and interpreted this study.   Assessment / Plan:  1. Coronary disease status post CABG. Vein graft to the OM is known to be occluded. He is not symptomatic on medical therapy. Myoview June 2016 without ischemia and normal EF.  He will continue with his medical therapy including metoprolol, isosorbide, and amlodipine.  2. Atrial fibrillation/flutter. He is on chronic Coumadin therapy. He remains in sinus rhythm at this time. We'll continue with rate control with metoprolol. Very mild infrequent symptoms.  3. Hyperlipidemia. On statin therapy and fish oil. Will request most recent labs from Dr. Lavone Neri.  4. HTN. Now BP well controlled today. Renal artery duplex without stenosis. Continue current meds. If BP increases instructed him to take extra 0.1 mg of clonidine.   5. 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 Needs to focus on weight loss.

## 2016-04-29 ENCOUNTER — Encounter: Payer: Self-pay | Admitting: Cardiology

## 2016-04-29 ENCOUNTER — Ambulatory Visit (INDEPENDENT_AMBULATORY_CARE_PROVIDER_SITE_OTHER): Payer: Medicare Other | Admitting: Cardiology

## 2016-04-29 ENCOUNTER — Ambulatory Visit (INDEPENDENT_AMBULATORY_CARE_PROVIDER_SITE_OTHER): Payer: Medicare Other | Admitting: Pharmacist Clinician (PhC)/ Clinical Pharmacy Specialist

## 2016-04-29 VITALS — BP 124/60 | HR 68 | Ht 69.0 in | Wt 218.0 lb

## 2016-04-29 DIAGNOSIS — I4891 Unspecified atrial fibrillation: Secondary | ICD-10-CM

## 2016-04-29 DIAGNOSIS — I2510 Atherosclerotic heart disease of native coronary artery without angina pectoris: Secondary | ICD-10-CM

## 2016-04-29 DIAGNOSIS — I4892 Unspecified atrial flutter: Secondary | ICD-10-CM | POA: Diagnosis not present

## 2016-04-29 DIAGNOSIS — I480 Paroxysmal atrial fibrillation: Secondary | ICD-10-CM | POA: Diagnosis not present

## 2016-04-29 DIAGNOSIS — I10 Essential (primary) hypertension: Secondary | ICD-10-CM | POA: Diagnosis not present

## 2016-04-29 DIAGNOSIS — E785 Hyperlipidemia, unspecified: Secondary | ICD-10-CM

## 2016-04-29 DIAGNOSIS — Z7901 Long term (current) use of anticoagulants: Secondary | ICD-10-CM

## 2016-04-29 DIAGNOSIS — Z5181 Encounter for therapeutic drug level monitoring: Secondary | ICD-10-CM

## 2016-04-29 LAB — POCT INR: INR: 2.2

## 2016-04-29 MED ORDER — NITROGLYCERIN 0.4 MG SL SUBL: SUBLINGUAL_TABLET | SUBLINGUAL | 1 refills | Status: DC

## 2016-04-29 NOTE — Patient Instructions (Signed)
Continue your current therapy  We will request a copy of your labs from Dr. Lavone Neri.  I will see you in 6months.

## 2016-05-13 ENCOUNTER — Other Ambulatory Visit: Payer: Self-pay | Admitting: Cardiology

## 2016-05-13 DIAGNOSIS — Z7901 Long term (current) use of anticoagulants: Secondary | ICD-10-CM

## 2016-05-13 DIAGNOSIS — I2510 Atherosclerotic heart disease of native coronary artery without angina pectoris: Secondary | ICD-10-CM

## 2016-05-13 DIAGNOSIS — I480 Paroxysmal atrial fibrillation: Secondary | ICD-10-CM

## 2016-05-13 DIAGNOSIS — E785 Hyperlipidemia, unspecified: Secondary | ICD-10-CM

## 2016-05-13 DIAGNOSIS — I10 Essential (primary) hypertension: Secondary | ICD-10-CM

## 2016-05-30 ENCOUNTER — Encounter (INDEPENDENT_AMBULATORY_CARE_PROVIDER_SITE_OTHER): Payer: Self-pay

## 2016-05-30 ENCOUNTER — Ambulatory Visit (INDEPENDENT_AMBULATORY_CARE_PROVIDER_SITE_OTHER): Payer: Medicare Other | Admitting: Pharmacist

## 2016-05-30 DIAGNOSIS — Z7901 Long term (current) use of anticoagulants: Secondary | ICD-10-CM

## 2016-05-30 DIAGNOSIS — Z5181 Encounter for therapeutic drug level monitoring: Secondary | ICD-10-CM | POA: Diagnosis not present

## 2016-05-30 DIAGNOSIS — I4891 Unspecified atrial fibrillation: Secondary | ICD-10-CM | POA: Diagnosis not present

## 2016-05-30 LAB — POCT INR: INR: 2.7

## 2016-05-31 DIAGNOSIS — Z9889 Other specified postprocedural states: Secondary | ICD-10-CM

## 2016-05-31 DIAGNOSIS — Z85820 Personal history of malignant melanoma of skin: Secondary | ICD-10-CM | POA: Insufficient documentation

## 2016-06-16 ENCOUNTER — Other Ambulatory Visit: Payer: Self-pay | Admitting: Cardiology

## 2016-06-16 ENCOUNTER — Other Ambulatory Visit: Payer: Self-pay

## 2016-06-16 MED ORDER — LISINOPRIL 40 MG PO TABS: 40.0000 mg | ORAL_TABLET | Freq: Every day | ORAL | 1 refills | Status: DC

## 2016-06-23 ENCOUNTER — Other Ambulatory Visit: Payer: Self-pay | Admitting: Cardiology

## 2016-07-06 NOTE — Telephone Encounter (Signed)
Called patient I received a CD of MRI of neck you brought to office for Dr.Jordan.Advised I will leave CD at [facility] office and you can pick up 07/11/16 at INR appointment.

## 2016-07-11 ENCOUNTER — Ambulatory Visit (INDEPENDENT_AMBULATORY_CARE_PROVIDER_SITE_OTHER): Payer: Medicare Other | Admitting: *Deleted

## 2016-07-11 DIAGNOSIS — Z5181 Encounter for therapeutic drug level monitoring: Secondary | ICD-10-CM

## 2016-07-11 DIAGNOSIS — I4891 Unspecified atrial fibrillation: Secondary | ICD-10-CM

## 2016-07-11 DIAGNOSIS — Z7901 Long term (current) use of anticoagulants: Secondary | ICD-10-CM | POA: Diagnosis not present

## 2016-07-11 LAB — POCT INR: INR: 2.8

## 2016-07-26 ENCOUNTER — Other Ambulatory Visit: Payer: Self-pay | Admitting: Cardiology

## 2016-07-26 ENCOUNTER — Telehealth: Payer: Self-pay

## 2016-07-26 NOTE — Telephone Encounter (Signed)
Rx has been sent to the pharmacy electronically. ° °

## 2016-07-26 NOTE — Telephone Encounter (Signed)
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

## 2016-08-22 ENCOUNTER — Ambulatory Visit (INDEPENDENT_AMBULATORY_CARE_PROVIDER_SITE_OTHER): Payer: Medicare Other | Admitting: *Deleted

## 2016-08-22 DIAGNOSIS — I4891 Unspecified atrial fibrillation: Secondary | ICD-10-CM

## 2016-08-22 DIAGNOSIS — I2510 Atherosclerotic heart disease of native coronary artery without angina pectoris: Secondary | ICD-10-CM | POA: Diagnosis not present

## 2016-08-22 DIAGNOSIS — Z7901 Long term (current) use of anticoagulants: Secondary | ICD-10-CM | POA: Diagnosis not present

## 2016-08-22 DIAGNOSIS — Z5181 Encounter for therapeutic drug level monitoring: Secondary | ICD-10-CM

## 2016-08-22 LAB — POCT INR: INR: 2.5

## 2016-09-19 ENCOUNTER — Other Ambulatory Visit: Payer: Self-pay | Admitting: Cardiology

## 2016-09-19 DIAGNOSIS — I10 Essential (primary) hypertension: Secondary | ICD-10-CM

## 2016-09-19 DIAGNOSIS — I480 Paroxysmal atrial fibrillation: Secondary | ICD-10-CM

## 2016-09-19 DIAGNOSIS — I2510 Atherosclerotic heart disease of native coronary artery without angina pectoris: Secondary | ICD-10-CM

## 2016-09-19 DIAGNOSIS — E785 Hyperlipidemia, unspecified: Secondary | ICD-10-CM

## 2016-09-19 DIAGNOSIS — Z7901 Long term (current) use of anticoagulants: Secondary | ICD-10-CM

## 2016-10-03 ENCOUNTER — Ambulatory Visit (INDEPENDENT_AMBULATORY_CARE_PROVIDER_SITE_OTHER): Payer: Medicare Other | Admitting: *Deleted

## 2016-10-03 DIAGNOSIS — I4891 Unspecified atrial fibrillation: Secondary | ICD-10-CM

## 2016-10-03 DIAGNOSIS — Z5181 Encounter for therapeutic drug level monitoring: Secondary | ICD-10-CM | POA: Diagnosis not present

## 2016-10-03 DIAGNOSIS — Z7901 Long term (current) use of anticoagulants: Secondary | ICD-10-CM

## 2016-10-03 DIAGNOSIS — I2510 Atherosclerotic heart disease of native coronary artery without angina pectoris: Secondary | ICD-10-CM

## 2016-10-03 LAB — POCT INR: INR: 2.3

## 2016-10-10 DIAGNOSIS — E669 Obesity, unspecified: Secondary | ICD-10-CM | POA: Insufficient documentation

## 2016-10-24 NOTE — Progress Notes (Signed)
[name] Date of Birth: 1940-12-03   History of Present Illness: [name] is seen today for follow up CAD and atrial flutter.  He has a history of coronary disease with prior coronary bypass surgery in 2005. Last evaluation with cardiac catheterization in July of 2012 showed a patent LIMA graft to the LAD. The second marginal branch was occluded and the first marginal branch had a moderately severe stenosis. This is treated medically. His last Myoview study here in November 2013 showed a small apical fixed defect without ischemia. Ejection fraction 69%. He does have a history of paroxysmal atrial fibrillation and is on chronic Coumadin therapy. In February 2015 he had atrial flutter that resolved with IV Cardizem.  Echo in March 2015 showed a normal study except for mild diastolic dysfunction. In June 2016 he was visiting family in [place] when he developed severe HTN with BP increased to 218/118. He was admitted to the hospital in [facility], [place] and had extensive work up there. Myoview study showed a fixed inferior defect without ischemia and normal EF. Echo was normal.   On follow up today he notes he is doing well. He denies any chest pain or dyspnea. BP has been doing well. He has lost 10 lbs. Developed a head cold 3 weeks ago with productive cough. Notes some sternal pain with cough or movement. Cough is slowly improving.   Current Outpatient Prescriptions on File Prior to Visit  Medication Sig Dispense Refill  . acetaminophen (TYLENOL) 500 MG tablet Take 1,000 mg by mouth daily.     Marland Kitchen allopurinol (ZYLOPRIM) 300 MG tablet TAKE ONE TABLET BY MOUTH DAILY 30 tablet 0  . amLODipine-atorvastatin (CADUET) 10-40 MG per tablet TAKE 1 TABLET BY MOUTH DAILY. 30 tablet 1  . Ascorbic Acid (VITAMIN C) 500 MG tablet Take 1,000 mg by mouth daily.     Marland Kitchen b complex vitamins tablet Take 1 tablet by mouth daily.     . cloNIDine (CATAPRES) 0.2 MG tablet Take 1 tablet (0.2 mg total) by mouth 2 (two)  times daily. 60 tablet 6  . fish oil-omega-3 fatty acids 1000 MG capsule Take 1 g by mouth 2 (two) times daily.     . furosemide (LASIX) 20 MG tablet TAKE 1 TABLET (20 MG TOTAL) BY MOUTH DAILY.MUST KEEP JULY APPOINTMENT FOR FUTURE REFILLS 30 tablet 1  . GARLIC PO Take [id number] mg by mouth daily.    Marland Kitchen GLIPIZIDE PO Take 5 mg by mouth daily.     . isosorbide mononitrate (IMDUR) 30 MG 24 hr tablet TAKE 1 TABLET BY MOUTH DAILY 30 tablet 5  . Lancets (ONETOUCH ULTRASOFT) lancets     . lisinopril (PRINIVIL,ZESTRIL) 40 MG tablet TAKE 1 TABLET BY MOUTH DAILY. 90 tablet 2  . metoprolol succinate (TOPROL-XL) 100 MG 24 hr tablet TAKE ONE TABLET BY MOUTH DAILY 90 tablet 0  . Multiple Vitamin (MULTIVITAMIN WITH MINERALS) TABS tablet Take 1 tablet by mouth daily.    . niacin (NIASPAN) 500 MG CR tablet TAKE 1 TABLET (500 MG TOTAL) BY MOUTH AT BEDTIME. 30 tablet 5  . NITROSTAT 0.4 MG SL tablet PLACE 1 TABLET (0.4 MG TOTAL) UNDER THE TONGUE EVERY 5  MINUTES AS NEEDED FOR CHEST PAIN. 100 tablet 1  . ONE TOUCH ULTRA TEST test strip     . traMADol (ULTRAM) 50 MG tablet Take 1 tablet (50 mg total) by mouth every 6 (six) hours as needed for moderate pain. 30 tablet 0  . warfarin (  COUMADIN) 5 MG tablet TAKE 1 TABLET (5 MG TOTAL) BY MOUTH AS DIRECTED. 100 tablet 1   No current facility-administered medications on file prior to visit.    Allergies  Allergen Reactions  . Cephalexin Rash    Past Medical History:  Diagnosis Date  . Chest pain July 2012   s/p repeat cath; stable anatomy. Managed medically  . Coronary artery disease   . Current use of long term anticoagulation   . Diabetes mellitus   . Dyslipidemia   . Edema   . Full dentures   . Gout   . History of acute myocardial infarction 11/2005   acute anteroseptal myocardial infarction  . Hyperlipidemia   . Hypertension   . Osteoarthritis   . Paroxysmal atrial fibrillation (HCC)   . Wears glasses     Past Surgical History:  Procedure Laterality Date   . CARDIAC CATHETERIZATION  05/18/2009  . CARDIAC CATHETERIZATION  July 2012   Stable anatomy. Manage medically  . COLONOSCOPY    . CORONARY ARTERY BYPASS GRAFT  2005  . DEBRIDEMENT AND CLOSURE WOUND Left 06/18/2014   Procedure: CLOSURE OF OPEN WOUND OF LEFT CHEEK/POSSIBLE CHEEK ADVANCEMENT/ROTATION;  Surgeon: Theodoro Kos, DO;  Location: [place];  Service: Plastics;  Laterality: Left;  . INCISION AND DRAINAGE OF WOUND Left 07/17/2014   Procedure: IRRIGATION AND DEBRIDEMENT LEFT CHEEK WOUND WITH PLACEMENT OF ACELL;  Surgeon: Theodoro Kos, DO;  Location: [place];  Service: Plastics;  Laterality: Left;  . TONSILLECTOMY      History  Smoking Status  . Former Smoker  . Types: Cigarettes  . Quit date: 09/06/1987  Smokeless Tobacco  . Never Used    History  Alcohol Use No    Family History  Problem Relation Age of Onset  . Coronary artery disease Mother   . Heart disease Mother   . Coronary artery disease Father   . Heart disease Father   . Cancer Brother   . Cancer Brother     Review of Systems: The review of systems is as above.  All other systems were reviewed and are negative.  Physical Exam: BP 134/60 (BP Location: Left Arm, Patient Position: Sitting, Cuff Size: Normal)   Pulse (!) 56   Ht 5' 9.5" (1.765 m)   Wt 208 lb 12.8 oz (94.7 kg)   BMI 30.39 kg/m  Patient is very pleasant and in no acute distress. Skin is warm and dry. Color is normal.  HEENT is unremarkable. Normocephalic/atraumatic. PERRL. Sclera are nonicteric. Surgical dressing left cheek. Neck is supple. No masses. No JVD. Lungs are clear. Cardiac exam shows a regular rate and rhythm. Abdomen is soft. Extremities reveal mild ankle edema.  Gait and ROM are intact. No gross neurologic deficits noted.  LABORATORY DATA:    Lab Results  Component Value Date   WBC 8.7 10/28/2014   HGB 14.9 10/28/2014   HCT 44.0 10/28/2014   PLT 220 10/28/2014   GLUCOSE 128 (H) 10/28/2014    CHOL 135 04/02/2012   TRIG 130.0 04/02/2012   HDL 41.30 04/02/2012   LDLCALC 68 04/02/2012   ALT 28 03/18/2011   AST 27 03/18/2011   NA 136 10/28/2014   K 3.7 10/28/2014   CL 102 10/28/2014   CREATININE 0.95 10/28/2014   BUN 16 10/28/2014   CO2 26 10/28/2014   TSH 0.882 03/19/2011   INR 2.3 10/03/2016   HGBA1C (H) 05/16/2009    6.3 (NOTE) The ADA recommends the following therapeutic  goal for glycemic control related to Hgb A1c measurement: Goal of therapy: <6.5 Hgb A1c  Reference: American Diabetes Association: Clinical Practice Recommendations 2010, Diabetes Care, 2010, 33: (Suppl  1).     Labs dated 05/31/16: glucose 126. CMET normal. A1c 6.1%.  Dated 12/01/15: cholesterol 143, triglycerides 95, LDL 72, HDL 52.   Ecg today shows NSR with non specific T wave abnormality. I have personally reviewed and interpreted this study.   Assessment / Plan:  1. Coronary disease status post CABG. Vein graft to the OM is known to be occluded. He is not symptomatic on medical therapy. Myoview June 2016 without ischemia and normal EF.  He will continue with his medical therapy including metoprolol, isosorbide, and amlodipine.  2. Atrial fibrillation/flutter. He is on chronic Coumadin therapy. He remains in sinus rhythm at this time. We'll continue with rate control with metoprolol. INR has been therapeutic.  3. Hyperlipidemia. On statin therapy and fish oil. Excellent control.  4. HTN. Now BP well controlled today. Renal artery duplex without stenosis. Continue current meds. If BP increases instructed him to take extra 0.1 mg of clonidine.   5. 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 Last A1c 6.1%. Encouraged continued weight loss.  I will follow up in 6 months.

## 2016-10-27 ENCOUNTER — Encounter: Payer: Self-pay | Admitting: Cardiology

## 2016-10-27 ENCOUNTER — Ambulatory Visit (INDEPENDENT_AMBULATORY_CARE_PROVIDER_SITE_OTHER): Payer: Medicare Other | Admitting: Cardiology

## 2016-10-27 VITALS — BP 134/60 | HR 56 | Ht 69.5 in | Wt 208.8 lb

## 2016-10-27 DIAGNOSIS — Z7901 Long term (current) use of anticoagulants: Secondary | ICD-10-CM

## 2016-10-27 DIAGNOSIS — I10 Essential (primary) hypertension: Secondary | ICD-10-CM | POA: Diagnosis not present

## 2016-10-27 DIAGNOSIS — I480 Paroxysmal atrial fibrillation: Secondary | ICD-10-CM | POA: Diagnosis not present

## 2016-10-27 DIAGNOSIS — E785 Hyperlipidemia, unspecified: Secondary | ICD-10-CM

## 2016-10-27 DIAGNOSIS — I2510 Atherosclerotic heart disease of native coronary artery without angina pectoris: Secondary | ICD-10-CM | POA: Diagnosis not present

## 2016-10-27 NOTE — Patient Instructions (Signed)
Continue your current therapy  I will see you in 6 months.   

## 2016-11-14 ENCOUNTER — Ambulatory Visit (INDEPENDENT_AMBULATORY_CARE_PROVIDER_SITE_OTHER): Payer: Medicare Other | Admitting: *Deleted

## 2016-11-14 DIAGNOSIS — I2510 Atherosclerotic heart disease of native coronary artery without angina pectoris: Secondary | ICD-10-CM

## 2016-11-14 DIAGNOSIS — Z7901 Long term (current) use of anticoagulants: Secondary | ICD-10-CM | POA: Diagnosis not present

## 2016-11-14 DIAGNOSIS — Z5181 Encounter for therapeutic drug level monitoring: Secondary | ICD-10-CM

## 2016-11-14 DIAGNOSIS — I4891 Unspecified atrial fibrillation: Secondary | ICD-10-CM | POA: Diagnosis not present

## 2016-11-14 LAB — POCT INR: INR: 2.9

## 2016-12-12 ENCOUNTER — Other Ambulatory Visit: Payer: Self-pay | Admitting: Cardiology

## 2016-12-26 ENCOUNTER — Ambulatory Visit (INDEPENDENT_AMBULATORY_CARE_PROVIDER_SITE_OTHER): Payer: Medicare Other | Admitting: *Deleted

## 2016-12-26 DIAGNOSIS — I2510 Atherosclerotic heart disease of native coronary artery without angina pectoris: Secondary | ICD-10-CM

## 2016-12-26 DIAGNOSIS — I4891 Unspecified atrial fibrillation: Secondary | ICD-10-CM | POA: Diagnosis not present

## 2016-12-26 DIAGNOSIS — Z7901 Long term (current) use of anticoagulants: Secondary | ICD-10-CM | POA: Diagnosis not present

## 2016-12-26 DIAGNOSIS — Z5181 Encounter for therapeutic drug level monitoring: Secondary | ICD-10-CM

## 2016-12-26 LAB — POCT INR: INR: 3.3

## 2017-01-09 ENCOUNTER — Ambulatory Visit (INDEPENDENT_AMBULATORY_CARE_PROVIDER_SITE_OTHER): Payer: Medicare Other

## 2017-01-09 DIAGNOSIS — Z7901 Long term (current) use of anticoagulants: Secondary | ICD-10-CM

## 2017-01-09 DIAGNOSIS — Z5181 Encounter for therapeutic drug level monitoring: Secondary | ICD-10-CM | POA: Diagnosis not present

## 2017-01-09 DIAGNOSIS — I2510 Atherosclerotic heart disease of native coronary artery without angina pectoris: Secondary | ICD-10-CM

## 2017-01-09 DIAGNOSIS — I4891 Unspecified atrial fibrillation: Secondary | ICD-10-CM

## 2017-01-09 LAB — POCT INR: INR: 2.5

## 2017-02-07 ENCOUNTER — Ambulatory Visit (INDEPENDENT_AMBULATORY_CARE_PROVIDER_SITE_OTHER): Payer: Medicare Other | Admitting: *Deleted

## 2017-02-07 DIAGNOSIS — Z5181 Encounter for therapeutic drug level monitoring: Secondary | ICD-10-CM | POA: Diagnosis not present

## 2017-02-07 DIAGNOSIS — I2510 Atherosclerotic heart disease of native coronary artery without angina pectoris: Secondary | ICD-10-CM

## 2017-02-07 DIAGNOSIS — Z7901 Long term (current) use of anticoagulants: Secondary | ICD-10-CM

## 2017-02-07 DIAGNOSIS — I4891 Unspecified atrial fibrillation: Secondary | ICD-10-CM

## 2017-02-07 LAB — POCT INR: INR: 3.2

## 2017-02-24 ENCOUNTER — Other Ambulatory Visit: Payer: Self-pay | Admitting: Cardiology

## 2017-02-27 ENCOUNTER — Ambulatory Visit (INDEPENDENT_AMBULATORY_CARE_PROVIDER_SITE_OTHER): Payer: Medicare Other | Admitting: *Deleted

## 2017-02-27 DIAGNOSIS — Z5181 Encounter for therapeutic drug level monitoring: Secondary | ICD-10-CM

## 2017-02-27 DIAGNOSIS — I2510 Atherosclerotic heart disease of native coronary artery without angina pectoris: Secondary | ICD-10-CM

## 2017-02-27 DIAGNOSIS — I4891 Unspecified atrial fibrillation: Secondary | ICD-10-CM | POA: Diagnosis not present

## 2017-02-27 DIAGNOSIS — Z7901 Long term (current) use of anticoagulants: Secondary | ICD-10-CM

## 2017-02-27 LAB — POCT INR: INR: 2.2

## 2017-03-13 ENCOUNTER — Other Ambulatory Visit: Payer: Self-pay | Admitting: Cardiology

## 2017-03-13 DIAGNOSIS — I2510 Atherosclerotic heart disease of native coronary artery without angina pectoris: Secondary | ICD-10-CM

## 2017-03-13 DIAGNOSIS — I10 Essential (primary) hypertension: Secondary | ICD-10-CM

## 2017-03-13 DIAGNOSIS — I480 Paroxysmal atrial fibrillation: Secondary | ICD-10-CM

## 2017-03-13 DIAGNOSIS — E785 Hyperlipidemia, unspecified: Secondary | ICD-10-CM

## 2017-03-13 DIAGNOSIS — Z7901 Long term (current) use of anticoagulants: Secondary | ICD-10-CM

## 2017-03-15 ENCOUNTER — Other Ambulatory Visit: Payer: Self-pay | Admitting: Cardiology

## 2017-03-27 ENCOUNTER — Ambulatory Visit (INDEPENDENT_AMBULATORY_CARE_PROVIDER_SITE_OTHER): Payer: Medicare Other

## 2017-03-27 DIAGNOSIS — Z7901 Long term (current) use of anticoagulants: Secondary | ICD-10-CM | POA: Diagnosis not present

## 2017-03-27 DIAGNOSIS — Z5181 Encounter for therapeutic drug level monitoring: Secondary | ICD-10-CM | POA: Diagnosis not present

## 2017-03-27 DIAGNOSIS — I2510 Atherosclerotic heart disease of native coronary artery without angina pectoris: Secondary | ICD-10-CM | POA: Diagnosis not present

## 2017-03-27 DIAGNOSIS — I4891 Unspecified atrial fibrillation: Secondary | ICD-10-CM | POA: Diagnosis not present

## 2017-03-27 LAB — POCT INR: INR: 2.5

## 2017-03-31 ENCOUNTER — Encounter: Payer: Self-pay | Admitting: *Deleted

## 2017-04-17 NOTE — Progress Notes (Signed)
[name] Date of Birth: 02/23/1941   History of Present Illness: Mr. Peary is seen today for follow up CAD and atrial flutter.  He has a history of coronary disease with prior coronary bypass surgery in 2005. Last evaluation with cardiac catheterization in July of 2012 showed a patent LIMA graft to the LAD. The second marginal branch was occluded and the first marginal branch had a moderately severe stenosis. This is treated medically. His last Myoview study here in November 2013 showed a small apical fixed defect without ischemia. Ejection fraction 69%. He does have a history of paroxysmal atrial fibrillation and is on chronic Coumadin therapy. In February 2015 he had atrial flutter that resolved with IV Cardizem.  Echo in March 2015 showed a normal study except for mild diastolic dysfunction. In June 2016 he was visiting family in [place] when he developed severe HTN with BP increased to 218/118. He was admitted to the hospital in [facility], [place] and had extensive work up there. Myoview study showed a fixed inferior defect without ischemia and normal EF. Echo was normal.   On follow up today he notes he is doing well. He does note SOB when walking up hill so he generally avoids it. He does try and walk regularly. BP has been well controlled. Only one spike up to 180. Has chronic swelling in ankles and wears compression hose.  Current Outpatient Prescriptions on File Prior to Visit  Medication Sig Dispense Refill  . acetaminophen (TYLENOL) 500 MG tablet Take 1,000 mg by mouth daily.     Marland Kitchen allopurinol (ZYLOPRIM) 300 MG tablet TAKE ONE TABLET BY MOUTH DAILY 30 tablet 0  . amLODipine-atorvastatin (CADUET) 10-40 MG per tablet TAKE 1 TABLET BY MOUTH DAILY. 30 tablet 1  . Ascorbic Acid (VITAMIN C) 500 MG tablet Take 1,000 mg by mouth daily.     Marland Kitchen b complex vitamins tablet Take 1 tablet by mouth daily.     . cloNIDine (CATAPRES) 0.2 MG tablet Take 1 tablet (0.2 mg total) by mouth 2 (two) times  daily. 60 tablet 6  . fish oil-omega-3 fatty acids 1000 MG capsule Take 1 g by mouth 2 (two) times daily.     . furosemide (LASIX) 20 MG tablet TAKE 1 TABLET (20 MG TOTAL) BY MOUTH DAILY.MUST KEEP JULY APPOINTMENT FOR FUTURE REFILLS 30 tablet 1  . GARLIC PO Take 235 mg by mouth daily.    Marland Kitchen GLIPIZIDE PO Take 5 mg by mouth daily.     . isosorbide mononitrate (IMDUR) 30 MG 24 hr tablet TAKE 1 TABLET BY MOUTH DAILY 30 tablet 5  . Lancets (ONETOUCH ULTRASOFT) lancets     . lisinopril (PRINIVIL,ZESTRIL) 40 MG tablet TAKE 1 TABLET BY MOUTH DAILY. 90 tablet 2  . metoprolol succinate (TOPROL-XL) 100 MG 24 hr tablet TAKE ONE TABLET BY MOUTH DAILY 90 tablet 0  . Multiple Vitamin (MULTIVITAMIN WITH MINERALS) TABS tablet Take 1 tablet by mouth daily.    . niacin (NIASPAN) 500 MG CR tablet TAKE 1 TABLET (500 MG TOTAL) BY MOUTH AT BEDTIME. 30 tablet 5  . NITROSTAT 0.4 MG SL tablet PLACE 1 TABLET (0.4 MG TOTAL) UNDER THE TONGUE EVERY 5  MINUTES AS NEEDED FOR CHEST PAIN. 100 tablet 1  . ONE TOUCH ULTRA TEST test strip     . traMADol (ULTRAM) 50 MG tablet Take 1 tablet (50 mg total) by mouth every 6 (six) hours as needed for moderate pain. 30 tablet 0  . warfarin (COUMADIN)  5 MG tablet TAKE 1 TABLET (5 MG TOTAL) BY MOUTH AS DIRECTED. 100 tablet 1   No current facility-administered medications on file prior to visit.    Allergies  Allergen Reactions  . Cephalexin Rash    Past Medical History:  Diagnosis Date  . Chest pain July 2012   s/p repeat cath; stable anatomy. Managed medically  . Coronary artery disease   . Current use of long term anticoagulation   . Diabetes mellitus   . Dyslipidemia   . Edema   . Full dentures   . Gout   . History of acute myocardial infarction 11/2005   acute anteroseptal myocardial infarction  . Hyperlipidemia   . Hypertension   . Osteoarthritis   . Paroxysmal atrial fibrillation (HCC)   . Wears glasses     Past Surgical History:  Procedure Laterality Date  .  CARDIAC CATHETERIZATION  05/18/2009  . CARDIAC CATHETERIZATION  July 2012   Stable anatomy. Manage medically  . COLONOSCOPY    . CORONARY ARTERY BYPASS GRAFT  2005  . DEBRIDEMENT AND CLOSURE WOUND Left 06/18/2014   Procedure: CLOSURE OF OPEN WOUND OF LEFT CHEEK/POSSIBLE CHEEK ADVANCEMENT/ROTATION;  Surgeon: Theodoro Kos, DO;  Location: [facility];  Service: Plastics;  Laterality: Left;  . INCISION AND DRAINAGE OF WOUND Left 07/17/2014   Procedure: IRRIGATION AND DEBRIDEMENT LEFT CHEEK WOUND WITH PLACEMENT OF ACELL;  Surgeon: Theodoro Kos, DO;  Location: [facility];  Service: Plastics;  Laterality: Left;  . TONSILLECTOMY      History  Smoking Status  . Former Smoker  . Types: Cigarettes  . Quit date: 09/06/1987  Smokeless Tobacco  . Never Used    History  Alcohol Use No    Family History  Problem Relation Age of Onset  . Coronary artery disease Mother   . Heart disease Mother   . Coronary artery disease Father   . Heart disease Father   . Cancer Brother   . Cancer Brother     Review of Systems: The review of systems is as above.  All other systems were reviewed and are negative.  Physical Exam: BP 139/72   Pulse 74   Ht 5' 9.5" (1.765 m)   Wt 209 lb (94.8 kg)   SpO2 94%   BMI 30.42 kg/m  GENERAL:  Well appearing WM in NAD HEENT:  PERRL, EOMI, sclera are clear. Oropharynx is clear. NECK:  No jugular venous distention, carotid upstroke brisk and symmetric, no bruits, no thyromegaly or adenopathy LUNGS:  Clear to auscultation bilaterally CHEST:  Unremarkable HEART:  RRR,  PMI not displaced or sustained,S1 and S2 within normal limits, no S3, no S4: no clicks, no rubs, no murmurs ABD:  Soft, nontender. BS +, no masses or bruits. No hepatomegaly, no splenomegaly EXT:  2 + pulses throughout, no edema, no cyanosis no clubbing SKIN:  Warm and dry.  No rashes NEURO:  Alert and oriented x 3. Cranial nerves II through XII intact. PSYCH:   Cognitively intact    LABORATORY DATA:    Lab Results  Component Value Date   WBC 8.7 10/28/2014   HGB 14.9 10/28/2014   HCT 44.0 10/28/2014   PLT 220 10/28/2014   GLUCOSE 128 (H) 10/28/2014   CHOL 135 04/02/2012   TRIG 130.0 04/02/2012   HDL 41.30 04/02/2012   LDLCALC 68 04/02/2012   ALT 28 03/18/2011   AST 27 03/18/2011   NA 136 10/28/2014   K 3.7 10/28/2014   CL  102 10/28/2014   CREATININE 0.95 10/28/2014   BUN 16 10/28/2014   CO2 26 10/28/2014   TSH 0.882 03/19/2011   INR 2.5 03/27/2017   HGBA1C (H) 05/16/2009    6.3 (NOTE) The ADA recommends the following therapeutic goal for glycemic control related to Hgb A1c measurement: Goal of therapy: <6.5 Hgb A1c  Reference: American Diabetes Association: Clinical Practice Recommendations 2010, Diabetes Care, 2010, 33: (Suppl  1).     Labs dated 05/31/16: glucose 126. CMET normal. A1c 6.1%.  Dated 12/01/15: cholesterol 143, triglycerides 95, LDL 72, HDL 52. Dated 11/28/16: A1c 6.9%. Glucose 156, otherwise CMET normal. Cholesterol 142, triglycerides 207, HDL 40, LDL 61   Assessment / Plan:  1. Coronary disease status post CABG. Vein graft to the OM is known to be occluded. He is asymptomatic on medical therapy. Myoview June 2016 without ischemia and normal EF.  He will continue with his medical therapy including metoprolol, isosorbide, and amlodipine.  2. Atrial fibrillation/flutter. He is on chronic Coumadin therapy. He remains in sinus rhythm at this time. We'll continue with rate control with metoprolol. INR has been therapeutic.  3. Hyperlipidemia. On statin therapy and fish oil. Excellent control.  4. HTN.  BP well controlled today. Renal artery duplex without stenosis. Continue current meds. If BP increases instructed him to take extra 0.1 mg of clonidine. Encouraged adding some incline to his walk to improve conditioning.  5. 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 Last A1c 6.9%. Encouraged continued weight loss. Follow up labs in  September with primary care.  I will follow up in 6 months.

## 2017-04-18 ENCOUNTER — Ambulatory Visit (INDEPENDENT_AMBULATORY_CARE_PROVIDER_SITE_OTHER): Payer: Medicare Other | Admitting: Cardiology

## 2017-04-18 ENCOUNTER — Encounter: Payer: Self-pay | Admitting: Cardiology

## 2017-04-18 VITALS — BP 139/72 | HR 74 | Ht 69.5 in | Wt 209.0 lb

## 2017-04-18 DIAGNOSIS — I10 Essential (primary) hypertension: Secondary | ICD-10-CM | POA: Diagnosis not present

## 2017-04-18 DIAGNOSIS — I480 Paroxysmal atrial fibrillation: Secondary | ICD-10-CM | POA: Diagnosis not present

## 2017-04-18 DIAGNOSIS — E785 Hyperlipidemia, unspecified: Secondary | ICD-10-CM

## 2017-04-18 DIAGNOSIS — I2510 Atherosclerotic heart disease of native coronary artery without angina pectoris: Secondary | ICD-10-CM | POA: Diagnosis not present

## 2017-04-18 NOTE — Patient Instructions (Signed)
Continue your current therapy  I will see you in 6 months.   

## 2017-04-25 ENCOUNTER — Other Ambulatory Visit: Payer: Self-pay | Admitting: Cardiology

## 2017-05-01 ENCOUNTER — Ambulatory Visit (INDEPENDENT_AMBULATORY_CARE_PROVIDER_SITE_OTHER): Payer: Medicare Other | Admitting: *Deleted

## 2017-05-01 DIAGNOSIS — I2510 Atherosclerotic heart disease of native coronary artery without angina pectoris: Secondary | ICD-10-CM

## 2017-05-01 DIAGNOSIS — Z7901 Long term (current) use of anticoagulants: Secondary | ICD-10-CM

## 2017-05-01 DIAGNOSIS — Z5181 Encounter for therapeutic drug level monitoring: Secondary | ICD-10-CM

## 2017-05-01 DIAGNOSIS — I4891 Unspecified atrial fibrillation: Secondary | ICD-10-CM | POA: Diagnosis not present

## 2017-05-01 LAB — POCT INR: INR: 2

## 2017-05-14 ENCOUNTER — Other Ambulatory Visit: Payer: Self-pay | Admitting: Cardiology

## 2017-05-14 DIAGNOSIS — I480 Paroxysmal atrial fibrillation: Secondary | ICD-10-CM

## 2017-05-14 DIAGNOSIS — I2510 Atherosclerotic heart disease of native coronary artery without angina pectoris: Secondary | ICD-10-CM

## 2017-05-14 DIAGNOSIS — I10 Essential (primary) hypertension: Secondary | ICD-10-CM

## 2017-05-14 DIAGNOSIS — Z7901 Long term (current) use of anticoagulants: Secondary | ICD-10-CM

## 2017-05-14 DIAGNOSIS — E785 Hyperlipidemia, unspecified: Secondary | ICD-10-CM

## 2017-06-12 ENCOUNTER — Ambulatory Visit (INDEPENDENT_AMBULATORY_CARE_PROVIDER_SITE_OTHER): Payer: Medicare Other | Admitting: *Deleted

## 2017-06-12 ENCOUNTER — Encounter (INDEPENDENT_AMBULATORY_CARE_PROVIDER_SITE_OTHER): Payer: Self-pay

## 2017-06-12 ENCOUNTER — Other Ambulatory Visit: Payer: Self-pay | Admitting: Cardiology

## 2017-06-12 DIAGNOSIS — I480 Paroxysmal atrial fibrillation: Secondary | ICD-10-CM

## 2017-06-12 DIAGNOSIS — E785 Hyperlipidemia, unspecified: Secondary | ICD-10-CM

## 2017-06-12 DIAGNOSIS — Z5181 Encounter for therapeutic drug level monitoring: Secondary | ICD-10-CM

## 2017-06-12 DIAGNOSIS — I4891 Unspecified atrial fibrillation: Secondary | ICD-10-CM

## 2017-06-12 DIAGNOSIS — Z7901 Long term (current) use of anticoagulants: Secondary | ICD-10-CM | POA: Diagnosis not present

## 2017-06-12 DIAGNOSIS — I2510 Atherosclerotic heart disease of native coronary artery without angina pectoris: Secondary | ICD-10-CM

## 2017-06-12 DIAGNOSIS — I10 Essential (primary) hypertension: Secondary | ICD-10-CM

## 2017-06-12 LAB — POCT INR: INR: 3

## 2017-07-24 ENCOUNTER — Encounter (INDEPENDENT_AMBULATORY_CARE_PROVIDER_SITE_OTHER): Payer: Self-pay

## 2017-07-24 ENCOUNTER — Ambulatory Visit (INDEPENDENT_AMBULATORY_CARE_PROVIDER_SITE_OTHER): Payer: Medicare Other | Admitting: *Deleted

## 2017-07-24 DIAGNOSIS — Z7901 Long term (current) use of anticoagulants: Secondary | ICD-10-CM | POA: Diagnosis not present

## 2017-07-24 DIAGNOSIS — Z5181 Encounter for therapeutic drug level monitoring: Secondary | ICD-10-CM

## 2017-07-24 DIAGNOSIS — I4891 Unspecified atrial fibrillation: Secondary | ICD-10-CM | POA: Diagnosis not present

## 2017-07-24 LAB — POCT INR: INR: 2.8

## 2017-07-24 NOTE — Patient Instructions (Signed)
Continue on same dosage 1.5 tablets daily except 1 tablet on Sundays, Tuesdays, and Thursdays.  Recheck in 6 weeks. Continue 1 servings each week of leafy green.

## 2017-09-04 ENCOUNTER — Ambulatory Visit (INDEPENDENT_AMBULATORY_CARE_PROVIDER_SITE_OTHER): Payer: Medicare Other | Admitting: Pharmacist

## 2017-09-04 ENCOUNTER — Encounter (INDEPENDENT_AMBULATORY_CARE_PROVIDER_SITE_OTHER): Payer: Self-pay

## 2017-09-04 DIAGNOSIS — Z5181 Encounter for therapeutic drug level monitoring: Secondary | ICD-10-CM | POA: Diagnosis not present

## 2017-09-04 DIAGNOSIS — Z7901 Long term (current) use of anticoagulants: Secondary | ICD-10-CM

## 2017-09-04 DIAGNOSIS — I4891 Unspecified atrial fibrillation: Secondary | ICD-10-CM

## 2017-09-04 LAB — POCT INR: INR: 3.2

## 2017-09-04 NOTE — Patient Instructions (Signed)
Description   Take 1/2 tablet today, then continue on same dosage 1.5 tablets daily except 1 tablet on Sundays, Tuesdays, and Thursdays.  Recheck in 5 weeks. Continue 1 servings each week of leafy green.

## 2017-09-05 ENCOUNTER — Other Ambulatory Visit: Payer: Self-pay | Admitting: Cardiology

## 2017-09-05 DIAGNOSIS — Z7901 Long term (current) use of anticoagulants: Secondary | ICD-10-CM

## 2017-09-05 DIAGNOSIS — E785 Hyperlipidemia, unspecified: Secondary | ICD-10-CM

## 2017-09-05 DIAGNOSIS — I2510 Atherosclerotic heart disease of native coronary artery without angina pectoris: Secondary | ICD-10-CM

## 2017-09-05 DIAGNOSIS — I480 Paroxysmal atrial fibrillation: Secondary | ICD-10-CM

## 2017-09-05 DIAGNOSIS — I10 Essential (primary) hypertension: Secondary | ICD-10-CM

## 2017-10-09 ENCOUNTER — Ambulatory Visit (INDEPENDENT_AMBULATORY_CARE_PROVIDER_SITE_OTHER): Payer: Medicare Other | Admitting: *Deleted

## 2017-10-09 DIAGNOSIS — I4891 Unspecified atrial fibrillation: Secondary | ICD-10-CM

## 2017-10-09 DIAGNOSIS — Z7901 Long term (current) use of anticoagulants: Secondary | ICD-10-CM

## 2017-10-09 DIAGNOSIS — Z5181 Encounter for therapeutic drug level monitoring: Secondary | ICD-10-CM | POA: Diagnosis not present

## 2017-10-09 LAB — POCT INR: INR: 2.8

## 2017-10-09 NOTE — Patient Instructions (Signed)
Description   Continue on same dosage 1.5 tablets daily except 1 tablet on Sundays, Tuesdays, and Thursdays.  Recheck in 6 weeks. Continue 1 servings each week of leafy green.

## 2017-10-14 NOTE — Progress Notes (Signed)
[name] Date of Birth: 1941/06/12   History of Present Illness: [name] is seen today for follow up CAD and atrial flutter.  He has a history of coronary disease with prior coronary bypass surgery in 2005. Last evaluation with cardiac catheterization in July of 2012 showed a patent LIMA graft to the LAD. The second marginal branch was occluded and the first marginal branch had a moderately severe stenosis. This is treated medically. His last Myoview study here in November 2013 showed a small apical fixed defect without ischemia. Ejection fraction 69%. He does have a history of paroxysmal atrial fibrillation and is on chronic Coumadin therapy. In February 2015 he had atrial flutter that resolved with IV Cardizem.  Echo in March 2015 showed a normal study except for mild diastolic dysfunction. In June 2016 he was visiting family in [place] when he developed severe HTN with BP increased to 218/118. He was admitted to the hospital in [facility], [place] and had extensive work up there. Myoview study showed a fixed inferior defect without ischemia and normal EF. Echo was normal.   On follow up today he notes he is doing well from a cardiac standpoint. . He has some chronic dyspnea going up hill. No chest pain.  He does try and walk regularly. BP has been well controlled. Recently he has had recurrent nosebleeds. Seen by Dr. Redmond Baseman and had cautery x 2. Still had some bleeding last night.   Allergies as of 10/16/2017      Reactions   Cephalexin Rash      Medication List        Accurate as of 10/16/17  9:38 AM. Always use your most recent med list.          acetaminophen 500 MG tablet Commonly known as:  TYLENOL Take 1,000 mg by mouth daily.   allopurinol 300 MG tablet Commonly known as:  ZYLOPRIM TAKE 1 TABLET (300 MG TOTAL) BY MOUTH DAILY.   amLODipine 10 MG tablet Commonly known as:  NORVASC TAKE ONE TABLET BY MOUTH DAILY   atorvastatin 40 MG tablet Commonly known as:   LIPITOR TAKE 1 TABLET (40 MG TOTAL) BY MOUTH DAILY.   augmented betamethasone dipropionate 0.05 % cream Commonly known as:  DIPROLENE-AF Use as directed   b complex vitamins tablet Take 1 tablet by mouth daily.   cloNIDine 0.2 MG tablet Commonly known as:  CATAPRES TAKE 1 TABLET BY MOUTH TWICE DAILY   cromolyn 5.2 MG/ACT nasal spray Commonly known as:  NASALCROM Place 1 spray into the nose 3 (three) times daily.   fish oil-omega-3 fatty acids 1000 MG capsule Take 1 g by mouth 2 (two) times daily.   furosemide 20 MG tablet Commonly known as:  LASIX TAKE 1 TABLET BY MOUTH DAILY   GLIPIZIDE PO Take 5 mg by mouth daily.   GLUCOSAMINE CHOND MSM FORMULA PO Take 2 capsules by mouth daily.   isosorbide mononitrate 30 MG 24 hr tablet Commonly known as:  IMDUR TAKE 1 TABLET BY MOUTH DAILY   lisinopril 40 MG tablet Commonly known as:  PRINIVIL,ZESTRIL TAKE 1 TABLET BY MOUTH DAILY.   metFORMIN 500 MG 24 hr tablet Commonly known as:  GLUCOPHAGE-XR Take 2 tablets by mouth 2 (two) times daily. Take 2 tabs twice daily   metoprolol succinate 100 MG 24 hr tablet Commonly known as:  TOPROL-XL TAKE ONE TABLET BY MOUTH DAILY   multivitamin with minerals Tabs tablet Take 1 tablet by mouth daily.  nitroGLYCERIN 0.4 MG SL tablet Commonly known as:  NITROSTAT PLACE 1 TABLET (0.4 MG TOTAL) UNDER THE TONGUE EVERY 5  MINUTES AS NEEDED FOR CHEST PAIN.   ONE TOUCH ULTRA TEST test strip Generic drug:  glucose blood   onetouch ultrasoft lancets   vitamin C 500 MG tablet Commonly known as:  ASCORBIC ACID Take 1,000 mg by mouth daily.   warfarin 5 MG tablet Commonly known as:  COUMADIN Take as directed by the anticoagulation clinic. If you are unsure how to take this medication, talk to your nurse or doctor. Original instructions:  TAKE 1 TO 1 AND 1/2 TABLETS BY MOUTH DAILY AS DIRECTED BY COUMADIN CLINIC       Allergies  Allergen Reactions  . Cephalexin Rash    Past Medical  History:  Diagnosis Date  . Chest pain July 2012   s/p repeat cath; stable anatomy. Managed medically  . Coronary artery disease   . Current use of long term anticoagulation   . Diabetes mellitus   . Dyslipidemia   . Edema   . Full dentures   . Gout   . History of acute myocardial infarction 11/2005   acute anteroseptal myocardial infarction  . Hyperlipidemia   . Hypertension   . Osteoarthritis   . Paroxysmal atrial fibrillation (HCC)   . Wears glasses     Past Surgical History:  Procedure Laterality Date  . CARDIAC CATHETERIZATION  05/18/2009  . CARDIAC CATHETERIZATION  July 2012   Stable anatomy. Manage medically  . COLONOSCOPY    . CORONARY ARTERY BYPASS GRAFT  2005  . DEBRIDEMENT AND CLOSURE WOUND Left 06/18/2014   Procedure: CLOSURE OF OPEN WOUND OF LEFT CHEEK/POSSIBLE CHEEK ADVANCEMENT/ROTATION;  Surgeon: Theodoro Kos, DO;  Location: [place];  Service: Plastics;  Laterality: Left;  . INCISION AND DRAINAGE OF WOUND Left 07/17/2014   Procedure: IRRIGATION AND DEBRIDEMENT LEFT CHEEK WOUND WITH PLACEMENT OF ACELL;  Surgeon: Theodoro Kos, DO;  Location: [place];  Service: Plastics;  Laterality: Left;  . TONSILLECTOMY      Social History   Tobacco Use  Smoking Status Former Smoker  . Types: Cigarettes  . Last attempt to quit: 09/06/1987  . Years since quitting: 30.1  Smokeless Tobacco Never Used    Social History   Substance and Sexual Activity  Alcohol Use No    Family History  Problem Relation Age of Onset  . Coronary artery disease Mother   . Heart disease Mother   . Coronary artery disease Father   . Heart disease Father   . Cancer Brother   . Cancer Brother     Review of Systems: The review of systems is as above.  All other systems were reviewed and are negative.  Physical Exam: BP 126/68   Pulse 71   Ht 5\' 9"  (1.753 m)   Wt 209 lb 3.2 oz (94.9 kg)   BMI 30.89 kg/m  GENERAL:  Well appearing WM in  NAD HEENT:  PERRL, EOMI, sclera are clear. Oropharynx is clear. NECK:  No jugular venous distention, carotid upstroke brisk and symmetric, no bruits, no thyromegaly or adenopathy LUNGS:  Clear to auscultation bilaterally CHEST:  Unremarkable HEART:  RRR,  PMI not displaced or sustained,S1 and S2 within normal limits, no S3, no S4: no clicks, no rubs, no murmurs ABD:  Soft, nontender. BS +, no masses or bruits. No hepatomegaly, no splenomegaly EXT:  2 + pulses throughout, no edema, no cyanosis no clubbing SKIN:  Warm and dry.  No rashes NEURO:  Alert and oriented x 3. Cranial nerves II through XII intact. PSYCH:  Cognitively intact    LABORATORY DATA:    Lab Results  Component Value Date   WBC 8.7 10/28/2014   HGB 14.9 10/28/2014   HCT 44.0 10/28/2014   PLT 220 10/28/2014   GLUCOSE 128 (H) 10/28/2014   CHOL 135 04/02/2012   TRIG 130.0 04/02/2012   HDL 41.30 04/02/2012   LDLCALC 68 04/02/2012   ALT 28 03/18/2011   AST 27 03/18/2011   NA 136 10/28/2014   K 3.7 10/28/2014   CL 102 10/28/2014   CREATININE 0.95 10/28/2014   BUN 16 10/28/2014   CO2 26 10/28/2014   TSH 0.882 03/19/2011   INR 2.8 10/09/2017   HGBA1C (H) 05/16/2009    6.3 (NOTE) The ADA recommends the following therapeutic goal for glycemic control related to Hgb A1c measurement: Goal of therapy: <6.5 Hgb A1c  Reference: American Diabetes Association: Clinical Practice Recommendations 2010, Diabetes Care, 2010, 33: (Suppl  1).   Ecg today shows NSR with nonspecific ST abnormality. I have personally reviewed and interpreted this study.   Labs dated 05/31/16: glucose 126. CMET normal. A1c 6.1%.  Dated 12/01/15: cholesterol 143, triglycerides 95, LDL 72, HDL 52. Dated 11/28/16: A1c 6.9%. Glucose 156, otherwise CMET normal. Cholesterol 142, triglycerides 207, HDL 40, LDL 61 Dated 06/01/17: A1c 7%. Glucose 141, cholesterol 120, triglycerides 151, HDL 41, LDL 49. CMET normal.   Assessment / Plan:  1. Coronary disease  status post CABG. Vein graft to the OM is known to be occluded. He is asymptomatic on medical therapy. Myoview June 2016 without ischemia and normal EF.  He will continue with his medical therapy including metoprolol, isosorbide, and amlodipine.  2. Atrial fibrillation/flutter. He is on chronic Coumadin therapy. He remains in sinus rhythm at this time. We'll continue with rate control and anticoagulation.  3. Hyperlipidemia. On statin therapy and fish oil. Excellent control.  4. HTN.  BP well controlled today.   5. 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 Last A1c 7%. Encouraged continued weight loss. Follow up labs in September with primary care.  6. Epistaxis recurrent. Recommend he hold Coumadin for 4 days to allow this to heal.  I will follow up in 6 months.

## 2017-10-16 ENCOUNTER — Ambulatory Visit (INDEPENDENT_AMBULATORY_CARE_PROVIDER_SITE_OTHER): Payer: Medicare Other | Admitting: Cardiology

## 2017-10-16 ENCOUNTER — Encounter: Payer: Self-pay | Admitting: Cardiology

## 2017-10-16 VITALS — BP 126/68 | HR 71 | Ht 69.0 in | Wt 209.2 lb

## 2017-10-16 DIAGNOSIS — R040 Epistaxis: Secondary | ICD-10-CM

## 2017-10-16 DIAGNOSIS — I10 Essential (primary) hypertension: Secondary | ICD-10-CM | POA: Diagnosis not present

## 2017-10-16 DIAGNOSIS — I2510 Atherosclerotic heart disease of native coronary artery without angina pectoris: Secondary | ICD-10-CM | POA: Diagnosis not present

## 2017-10-16 DIAGNOSIS — I4891 Unspecified atrial fibrillation: Secondary | ICD-10-CM

## 2017-10-16 DIAGNOSIS — I480 Paroxysmal atrial fibrillation: Secondary | ICD-10-CM | POA: Diagnosis not present

## 2017-10-16 MED ORDER — NITROGLYCERIN 0.4 MG SL SUBL: SUBLINGUAL_TABLET | SUBLINGUAL | 1 refills | Status: DC

## 2017-10-16 NOTE — Patient Instructions (Signed)
Stop taking Coumadin for 4 days then resume to allow nosebleed to stop  Continue your other therapy  I will see you in 6 months.

## 2017-10-24 DIAGNOSIS — H9313 Tinnitus, bilateral: Secondary | ICD-10-CM | POA: Insufficient documentation

## 2017-10-24 DIAGNOSIS — H6121 Impacted cerumen, right ear: Secondary | ICD-10-CM | POA: Insufficient documentation

## 2017-10-24 DIAGNOSIS — R0683 Snoring: Secondary | ICD-10-CM | POA: Insufficient documentation

## 2017-11-20 ENCOUNTER — Ambulatory Visit (INDEPENDENT_AMBULATORY_CARE_PROVIDER_SITE_OTHER): Payer: Medicare Other | Admitting: *Deleted

## 2017-11-20 DIAGNOSIS — Z7901 Long term (current) use of anticoagulants: Secondary | ICD-10-CM | POA: Diagnosis not present

## 2017-11-20 DIAGNOSIS — Z5181 Encounter for therapeutic drug level monitoring: Secondary | ICD-10-CM

## 2017-11-20 DIAGNOSIS — I4891 Unspecified atrial fibrillation: Secondary | ICD-10-CM | POA: Diagnosis not present

## 2017-11-20 LAB — POCT INR: INR: 4.4

## 2017-11-20 NOTE — Patient Instructions (Signed)
Description   Do not take coumadin today March 18th and no coumadin on March 19th then continue on same dosage 1.5 tablets daily except 1 tablet on Sundays, Tuesdays, and Thursdays.  Recheck in 10 days   Do good serving of greens today and tomorrow then continue 1 serving each week of leafy green.

## 2017-11-23 ENCOUNTER — Other Ambulatory Visit (HOSPITAL_BASED_OUTPATIENT_CLINIC_OR_DEPARTMENT_OTHER): Payer: Self-pay

## 2017-11-23 DIAGNOSIS — R0683 Snoring: Secondary | ICD-10-CM

## 2017-12-04 ENCOUNTER — Ambulatory Visit (INDEPENDENT_AMBULATORY_CARE_PROVIDER_SITE_OTHER): Payer: Medicare Other | Admitting: Pharmacist

## 2017-12-04 ENCOUNTER — Other Ambulatory Visit: Payer: Self-pay | Admitting: Cardiology

## 2017-12-04 DIAGNOSIS — I4891 Unspecified atrial fibrillation: Secondary | ICD-10-CM | POA: Diagnosis not present

## 2017-12-04 DIAGNOSIS — Z7901 Long term (current) use of anticoagulants: Secondary | ICD-10-CM

## 2017-12-04 DIAGNOSIS — E785 Hyperlipidemia, unspecified: Secondary | ICD-10-CM

## 2017-12-04 DIAGNOSIS — I480 Paroxysmal atrial fibrillation: Secondary | ICD-10-CM

## 2017-12-04 DIAGNOSIS — Z5181 Encounter for therapeutic drug level monitoring: Secondary | ICD-10-CM

## 2017-12-04 DIAGNOSIS — I10 Essential (primary) hypertension: Secondary | ICD-10-CM

## 2017-12-04 DIAGNOSIS — I2510 Atherosclerotic heart disease of native coronary artery without angina pectoris: Secondary | ICD-10-CM

## 2017-12-04 LAB — POCT INR: INR: 2.1

## 2017-12-04 NOTE — Telephone Encounter (Signed)
REFILL 

## 2017-12-04 NOTE — Patient Instructions (Signed)
Description    Continue on same dosage 1.5 tablets daily except 1 tablet on Sundays, Tuesdays, and Thursdays.  Recheck in 4 weeks.      

## 2017-12-07 ENCOUNTER — Ambulatory Visit (HOSPITAL_BASED_OUTPATIENT_CLINIC_OR_DEPARTMENT_OTHER): Payer: Medicare Other | Attending: Otolaryngology | Admitting: Internal Medicine

## 2017-12-07 DIAGNOSIS — G4733 Obstructive sleep apnea (adult) (pediatric): Secondary | ICD-10-CM | POA: Diagnosis not present

## 2017-12-07 DIAGNOSIS — G4736 Sleep related hypoventilation in conditions classified elsewhere: Secondary | ICD-10-CM | POA: Insufficient documentation

## 2017-12-07 DIAGNOSIS — R0683 Snoring: Secondary | ICD-10-CM

## 2017-12-20 DIAGNOSIS — R0683 Snoring: Secondary | ICD-10-CM

## 2017-12-20 NOTE — Procedures (Signed)
    Patient Name: [name], [name] Date: 12/07/2017 Gender: Male D.O.B: 01-09-1941 Age (years): 77 Referring Provider: Melida Quitter Height (inches): 73 Interpreting Physician: Baird Lyons MD, ABSM Weight (lbs): 202 RPSGT: Gerhard Perches BMI: 30 MRN: [id number] Neck Size: 19.00  CLINICAL INFORMATION Sleep Study Type: HST Indication for sleep study: Snoring Epworth Sleepiness Score: 9  SLEEP STUDY TECHNIQUE A multi-channel overnight portable sleep study was performed. The channels recorded were: nasal airflow, thoracic respiratory movement, and oxygen saturation with a pulse oximetry. Snoring was also monitored.  MEDICATIONS Patient self administered medications include: none reported.  SLEEP ARCHITECTURE Patient was studied for 354.9 minutes. The sleep efficiency was 95.3 % and the patient was supine for 0%. The arousal index was 0.0 per hour.  RESPIRATORY PARAMETERS The overall AHI was 12.2 per hour, with a central apnea index of 0.3 per hour.  The oxygen nadir was 79% during sleep.  CARDIAC DATA Mean heart rate during sleep was 65.6 bpm.  IMPRESSIONS - Mild obstructive sleep apnea occurred during this study (AHI = 12.2/h). - No significant central sleep apnea occurred during this study (CAI = 0.3/h). - Oxygen desaturation was noted during this study (Min O2 = 79%). - Patient snored 6.4% during the sleep.  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 Other options would be based on clinical judgment. - Sleep hygiene should be reviewed to assess factors that may improve sleep quality. - Weight management and regular exercise should be initiated or continued.  [Electronically signed] 12/20/2017 01:27 PM  Baird Lyons MD, [place], American Board of Sleep Medicine   NPI: [id number]                          [place], [place] [facility]  ELECTRONICALLY SIGNED ON:  12/20/2017, 1:25 PM [place] PH: [phone]   FX: [phone] [place]

## 2017-12-21 DIAGNOSIS — G4733 Obstructive sleep apnea (adult) (pediatric): Secondary | ICD-10-CM | POA: Insufficient documentation

## 2018-01-01 ENCOUNTER — Encounter (INDEPENDENT_AMBULATORY_CARE_PROVIDER_SITE_OTHER): Payer: Self-pay

## 2018-01-01 ENCOUNTER — Ambulatory Visit (INDEPENDENT_AMBULATORY_CARE_PROVIDER_SITE_OTHER): Payer: Medicare Other | Admitting: Pharmacist

## 2018-01-01 DIAGNOSIS — Z5181 Encounter for therapeutic drug level monitoring: Secondary | ICD-10-CM

## 2018-01-01 DIAGNOSIS — I4891 Unspecified atrial fibrillation: Secondary | ICD-10-CM | POA: Diagnosis not present

## 2018-01-01 DIAGNOSIS — Z7901 Long term (current) use of anticoagulants: Secondary | ICD-10-CM | POA: Diagnosis not present

## 2018-01-01 LAB — POCT INR: INR: 2.7

## 2018-01-01 NOTE — Patient Instructions (Signed)
Description    Continue on same dosage 1.5 tablets daily except 1 tablet on Sundays, Tuesdays, and Thursdays.  Recheck in 4 weeks.      

## 2018-01-30 ENCOUNTER — Ambulatory Visit (INDEPENDENT_AMBULATORY_CARE_PROVIDER_SITE_OTHER): Payer: Medicare Other | Admitting: *Deleted

## 2018-01-30 DIAGNOSIS — Z5181 Encounter for therapeutic drug level monitoring: Secondary | ICD-10-CM | POA: Diagnosis not present

## 2018-01-30 DIAGNOSIS — I4891 Unspecified atrial fibrillation: Secondary | ICD-10-CM

## 2018-01-30 DIAGNOSIS — Z7901 Long term (current) use of anticoagulants: Secondary | ICD-10-CM

## 2018-01-30 LAB — POCT INR: INR: 2.7 (ref 2.0–3.0)

## 2018-01-30 NOTE — Patient Instructions (Signed)
Description    Continue on same dosage 1.5 tablets daily except 1 tablet on Sundays, Tuesdays, and Thursdays.  Recheck in 4 weeks.      

## 2018-02-26 ENCOUNTER — Encounter (INDEPENDENT_AMBULATORY_CARE_PROVIDER_SITE_OTHER): Payer: Self-pay

## 2018-02-26 ENCOUNTER — Ambulatory Visit (INDEPENDENT_AMBULATORY_CARE_PROVIDER_SITE_OTHER): Payer: Medicare Other | Admitting: *Deleted

## 2018-02-26 DIAGNOSIS — Z5181 Encounter for therapeutic drug level monitoring: Secondary | ICD-10-CM

## 2018-02-26 DIAGNOSIS — I4891 Unspecified atrial fibrillation: Secondary | ICD-10-CM | POA: Diagnosis not present

## 2018-02-26 DIAGNOSIS — Z7901 Long term (current) use of anticoagulants: Secondary | ICD-10-CM

## 2018-02-26 LAB — POCT INR: INR: 3.3 — AB (ref 2.0–3.0)

## 2018-02-26 NOTE — Patient Instructions (Signed)
Description   Skip today's dose, then Continue on same dosage 1.5 tablets daily except 1 tablet on Sundays, Tuesdays, and Thursdays.  Recheck in 3 weeks.

## 2018-03-05 ENCOUNTER — Other Ambulatory Visit: Payer: Self-pay | Admitting: Cardiology

## 2018-03-05 DIAGNOSIS — I480 Paroxysmal atrial fibrillation: Secondary | ICD-10-CM

## 2018-03-05 DIAGNOSIS — E785 Hyperlipidemia, unspecified: Secondary | ICD-10-CM

## 2018-03-05 DIAGNOSIS — I10 Essential (primary) hypertension: Secondary | ICD-10-CM

## 2018-03-05 DIAGNOSIS — I2510 Atherosclerotic heart disease of native coronary artery without angina pectoris: Secondary | ICD-10-CM

## 2018-03-05 DIAGNOSIS — Z7901 Long term (current) use of anticoagulants: Secondary | ICD-10-CM

## 2018-03-05 NOTE — Telephone Encounter (Signed)
Rx(s) sent to pharmacy electronically.  

## 2018-03-19 ENCOUNTER — Ambulatory Visit (INDEPENDENT_AMBULATORY_CARE_PROVIDER_SITE_OTHER): Payer: Medicare Other | Admitting: *Deleted

## 2018-03-19 DIAGNOSIS — Z7901 Long term (current) use of anticoagulants: Secondary | ICD-10-CM | POA: Diagnosis not present

## 2018-03-19 DIAGNOSIS — I4891 Unspecified atrial fibrillation: Secondary | ICD-10-CM

## 2018-03-19 DIAGNOSIS — Z5181 Encounter for therapeutic drug level monitoring: Secondary | ICD-10-CM | POA: Diagnosis not present

## 2018-03-19 LAB — POCT INR: INR: 2.4 (ref 2.0–3.0)

## 2018-03-19 NOTE — Patient Instructions (Signed)
Description    Continue on same dosage 1.5 tablets daily except 1 tablet on Sundays, Tuesdays, and Thursdays.  Recheck in 4 weeks.

## 2018-04-16 ENCOUNTER — Ambulatory Visit (INDEPENDENT_AMBULATORY_CARE_PROVIDER_SITE_OTHER): Payer: Medicare Other | Admitting: Pharmacist

## 2018-04-16 DIAGNOSIS — Z5181 Encounter for therapeutic drug level monitoring: Secondary | ICD-10-CM | POA: Diagnosis not present

## 2018-04-16 DIAGNOSIS — I4891 Unspecified atrial fibrillation: Secondary | ICD-10-CM | POA: Diagnosis not present

## 2018-04-16 DIAGNOSIS — Z7901 Long term (current) use of anticoagulants: Secondary | ICD-10-CM | POA: Diagnosis not present

## 2018-04-16 DIAGNOSIS — I4892 Unspecified atrial flutter: Secondary | ICD-10-CM | POA: Diagnosis not present

## 2018-04-16 LAB — POCT INR: INR: 2.7 (ref 2.0–3.0)

## 2018-04-16 NOTE — Patient Instructions (Signed)
Description    Continue on same dosage 1.5 tablets daily except 1 tablet on Sundays, Tuesdays, and Thursdays.  Recheck in 4 weeks.

## 2018-05-04 ENCOUNTER — Other Ambulatory Visit: Payer: Self-pay | Admitting: Cardiology

## 2018-05-04 DIAGNOSIS — Z7901 Long term (current) use of anticoagulants: Secondary | ICD-10-CM

## 2018-05-04 DIAGNOSIS — I10 Essential (primary) hypertension: Secondary | ICD-10-CM

## 2018-05-04 DIAGNOSIS — I2510 Atherosclerotic heart disease of native coronary artery without angina pectoris: Secondary | ICD-10-CM

## 2018-05-04 DIAGNOSIS — I480 Paroxysmal atrial fibrillation: Secondary | ICD-10-CM

## 2018-05-04 DIAGNOSIS — E785 Hyperlipidemia, unspecified: Secondary | ICD-10-CM

## 2018-05-04 NOTE — Telephone Encounter (Signed)
Rx sent to pharmacy   

## 2018-05-12 NOTE — Progress Notes (Signed)
[name] Date of Birth: 08/13/1941   History of Present Illness: [name] is seen today for follow up CAD and atrial flutter.  He has a history of coronary disease with prior coronary bypass surgery in 2005. Last evaluation with cardiac catheterization in July of 2012 showed a patent LIMA graft to the LAD. The second marginal branch was occluded and the first marginal branch had a moderately severe stenosis. This is treated medically. His last Myoview study here in November 2013 showed a small apical fixed defect without ischemia. Ejection fraction 69%. He does have a history of paroxysmal atrial fibrillation and is on chronic Coumadin therapy. In February 2015 he had atrial flutter that resolved with IV Cardizem.  Echo in March 2015 showed a normal study except for mild diastolic dysfunction. In June 2016 he was visiting family in [place] when he developed severe HTN with BP increased to 218/118. He was admitted to the hospital in [facility], [place] and had extensive work up there. Myoview study showed a fixed inferior defect without ischemia and normal EF. Echo was normal.   On follow up today he notes he is doing well from a cardiac standpoint. No chest pain. No change in dyspnea going up hill.  He does try and walk regularly. BP has been well controlled except when he eats sodium. No further nose bleeds. He did have a sleep study in April showing sleep apnea and he wants referral to pulmonary. He does have chronic edema.  Allergies as of 05/16/2018      Reactions   Cephalexin Rash      Medication List        Accurate as of 05/16/18  9:37 AM. Always use your most recent med list.          acetaminophen 500 MG tablet Commonly known as:  TYLENOL Take 1,000 mg by mouth daily.   allopurinol 300 MG tablet Commonly known as:  ZYLOPRIM TAKE 1 TABLET (300 MG TOTAL) BY MOUTH DAILY.   amLODipine 10 MG tablet Commonly known as:  NORVASC TAKE ONE TABLET BY MOUTH DAILY   atorvastatin  40 MG tablet Commonly known as:  LIPITOR Take 1 tablet (40 mg total) by mouth daily.   augmented betamethasone dipropionate 0.05 % cream Commonly known as:  DIPROLENE-AF Use as directed   b complex vitamins tablet Take 1 tablet by mouth daily.   cloNIDine 0.2 MG tablet Commonly known as:  CATAPRES Take 1 tablet (0.2 mg total) by mouth 2 (two) times daily.   cromolyn 5.2 MG/ACT nasal spray Commonly known as:  NASALCROM Place 1 spray into the nose 3 (three) times daily.   fish oil-omega-3 fatty acids 1000 MG capsule Take 1 g by mouth 2 (two) times daily.   furosemide 20 MG tablet Commonly known as:  LASIX Take 1 tablet (20 mg total) by mouth daily.   GLIPIZIDE PO Take 5 mg by mouth daily.   GLUCOSAMINE CHOND MSM FORMULA PO Take 2 capsules by mouth daily.   isosorbide mononitrate 30 MG 24 hr tablet Commonly known as:  IMDUR TAKE 1 TABLET BY MOUTH DAILY   lisinopril 40 MG tablet Commonly known as:  PRINIVIL,ZESTRIL TAKE 1 TABLET BY MOUTH DAILY.   metFORMIN 500 MG 24 hr tablet Commonly known as:  GLUCOPHAGE-XR Take 2 tablets by mouth 2 (two) times daily. Take 2 tabs twice daily   metoprolol succinate 100 MG 24 hr tablet Commonly known as:  TOPROL-XL TAKE ONE TABLET BY MOUTH DAILY  multivitamin with minerals Tabs tablet Take 1 tablet by mouth daily.   nitroGLYCERIN 0.4 MG SL tablet Commonly known as:  NITROSTAT PLACE 1 TABLET (0.4 MG TOTAL) UNDER THE TONGUE EVERY 5  MINUTES AS NEEDED FOR CHEST PAIN.   ONE TOUCH ULTRA TEST test strip Generic drug:  glucose blood   onetouch ultrasoft lancets   vitamin C 500 MG tablet Commonly known as:  ASCORBIC ACID Take 1,000 mg by mouth daily.   warfarin 5 MG tablet Commonly known as:  COUMADIN Take as directed by the anticoagulation clinic. If you are unsure how to take this medication, talk to your nurse or doctor. Original instructions:  TAKE 1 TO 1 AND 1/2 TABLETS BY MOUTH DAILY AS DIRECTED BY COUMADIN CLINIC        Allergies  Allergen Reactions  . Cephalexin Rash    Past Medical History:  Diagnosis Date  . Chest pain July 2012   s/p repeat cath; stable anatomy. Managed medically  . Coronary artery disease   . Current use of long term anticoagulation   . Diabetes mellitus   . Dyslipidemia   . Edema   . Full dentures   . Gout   . History of acute myocardial infarction 11/2005   acute anteroseptal myocardial infarction  . Hyperlipidemia   . Hypertension   . Osteoarthritis   . Paroxysmal atrial fibrillation (HCC)   . Wears glasses     Past Surgical History:  Procedure Laterality Date  . CARDIAC CATHETERIZATION  05/18/2009  . CARDIAC CATHETERIZATION  July 2012   Stable anatomy. Manage medically  . COLONOSCOPY    . CORONARY ARTERY BYPASS GRAFT  2005  . DEBRIDEMENT AND CLOSURE WOUND Left 06/18/2014   Procedure: CLOSURE OF OPEN WOUND OF LEFT CHEEK/POSSIBLE CHEEK ADVANCEMENT/ROTATION;  Surgeon: Theodoro Kos, DO;  Location: [facility];  Service: Plastics;  Laterality: Left;  . INCISION AND DRAINAGE OF WOUND Left 07/17/2014   Procedure: IRRIGATION AND DEBRIDEMENT LEFT CHEEK WOUND WITH PLACEMENT OF ACELL;  Surgeon: Theodoro Kos, DO;  Location: [facility];  Service: Plastics;  Laterality: Left;  . TONSILLECTOMY      Social History   Tobacco Use  Smoking Status Former Smoker  . Types: Cigarettes  . Last attempt to quit: 09/06/1987  . Years since quitting: 30.7  Smokeless Tobacco Never Used    Social History   Substance and Sexual Activity  Alcohol Use No    Family History  Problem Relation Age of Onset  . Coronary artery disease Mother   . Heart disease Mother   . Coronary artery disease Father   . Heart disease Father   . Cancer Brother   . Cancer Brother     Review of Systems: The review of systems is as above.  All other systems were reviewed and are negative.  Physical Exam: BP 138/60 (BP Location: Left Arm, Patient Position: Sitting,  Cuff Size: Normal)   Pulse 75   Ht 5\' 9"  (1.753 m)   Wt 209 lb (94.8 kg)   BMI 30.86 kg/m  GENERAL:  Well appearing WM in NAD HEENT:  PERRL, EOMI, sclera are clear. Oropharynx is clear. NECK:  No jugular venous distention, carotid upstroke brisk and symmetric, no bruits, no thyromegaly or adenopathy LUNGS:  Clear to auscultation bilaterally CHEST:  Unremarkable HEART:  RRR,  PMI not displaced or sustained,S1 and S2 within normal limits, no S3, no S4: no clicks, no rubs, no murmurs ABD:  Soft, nontender. BS +, no  masses or bruits. No hepatomegaly, no splenomegaly EXT:  2 + pulses throughout, 1-2+ R>L edema, no cyanosis no clubbing SKIN:  Warm and dry.  No rashes NEURO:  Alert and oriented x 3. Cranial nerves II through XII intact. PSYCH:  Cognitively intact      LABORATORY DATA:    Lab Results  Component Value Date   WBC 8.7 10/28/2014   HGB 14.9 10/28/2014   HCT 44.0 10/28/2014   PLT 220 10/28/2014   GLUCOSE 128 (H) 10/28/2014   CHOL 135 04/02/2012   TRIG 130.0 04/02/2012   HDL 41.30 04/02/2012   LDLCALC 68 04/02/2012   ALT 28 03/18/2011   AST 27 03/18/2011   NA 136 10/28/2014   K 3.7 10/28/2014   CL 102 10/28/2014   CREATININE 0.95 10/28/2014   BUN 16 10/28/2014   CO2 26 10/28/2014   TSH 0.882 03/19/2011   INR 2.7 04/16/2018   HGBA1C (H) 05/16/2009    6.3 (NOTE) The ADA recommends the following therapeutic goal for glycemic control related to Hgb A1c measurement: Goal of therapy: <6.5 Hgb A1c  Reference: American Diabetes Association: Clinical Practice Recommendations 2010, Diabetes Care, 2010, 33: (Suppl  1).     Labs dated 05/31/16: glucose 126. CMET normal. A1c 6.1%.  Dated 12/01/15: cholesterol 143, triglycerides 95, LDL 72, HDL 52. Dated 11/28/16: A1c 6.9%. Glucose 156, otherwise CMET normal. Cholesterol 142, triglycerides 207, HDL 40, LDL 61 Dated 06/01/17: A1c 7%. Glucose 141, cholesterol 120, triglycerides 151, HDL 41, LDL 49. CMET normal.  Dated 11/30/17:  A1c 7.1%. Glucose 137. Other chemistries normal.   Assessment / Plan:  1. Coronary disease status post CABG. Vein graft to the OM is known to be occluded. He is asymptomatic on medical therapy. Myoview June 2016 without ischemia and normal EF.  He will continue with his medical therapy including metoprolol, isosorbide, and amlodipine.  2. Atrial fibrillation/flutter- paroxysmal. He is on chronic Coumadin therapy. He remains in sinus rhythm at this time. We'll continue with rate control and anticoagulation. Check INR today  3. Hyperlipidemia. On statin therapy and fish oil. Due for repeat lipid panel with primary care.   4. HTN.  BP well controlled today.   5. 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 Last A1c 7.1%. Encouraged continued weight loss.   6. Epistaxis recurrent. Recommend he hold Coumadin for 4 days to allow this to heal.  7. Edema. Chronic. May be exacerbated by amlodipine but few other options for BP control. Continue lasix, elevation, support hose, and sodium restriction  8. OSA- pulmonary referral.  I will follow up in 6 months.

## 2018-05-16 ENCOUNTER — Ambulatory Visit (INDEPENDENT_AMBULATORY_CARE_PROVIDER_SITE_OTHER): Payer: Medicare Other | Admitting: Cardiology

## 2018-05-16 ENCOUNTER — Encounter: Payer: Self-pay | Admitting: Cardiology

## 2018-05-16 ENCOUNTER — Ambulatory Visit (INDEPENDENT_AMBULATORY_CARE_PROVIDER_SITE_OTHER): Payer: Medicare Other | Admitting: Pharmacist

## 2018-05-16 VITALS — BP 138/60 | HR 75 | Ht 69.0 in | Wt 209.0 lb

## 2018-05-16 DIAGNOSIS — Z5181 Encounter for therapeutic drug level monitoring: Secondary | ICD-10-CM

## 2018-05-16 DIAGNOSIS — I4891 Unspecified atrial fibrillation: Secondary | ICD-10-CM | POA: Diagnosis not present

## 2018-05-16 DIAGNOSIS — G4730 Sleep apnea, unspecified: Secondary | ICD-10-CM | POA: Diagnosis not present

## 2018-05-16 DIAGNOSIS — I10 Essential (primary) hypertension: Secondary | ICD-10-CM | POA: Diagnosis not present

## 2018-05-16 DIAGNOSIS — I480 Paroxysmal atrial fibrillation: Secondary | ICD-10-CM

## 2018-05-16 DIAGNOSIS — Z7901 Long term (current) use of anticoagulants: Secondary | ICD-10-CM

## 2018-05-16 DIAGNOSIS — I2510 Atherosclerotic heart disease of native coronary artery without angina pectoris: Secondary | ICD-10-CM

## 2018-05-16 LAB — POCT INR: INR: 2.3 (ref 2.0–3.0)

## 2018-05-16 NOTE — Patient Instructions (Signed)
We will refer you to pulmonary for your sleep apnea  Continue your other therapy. You may take an extra lasix if needed for increased swelling.  Follow up in 6 months

## 2018-05-30 ENCOUNTER — Other Ambulatory Visit: Payer: Self-pay | Admitting: Cardiology

## 2018-05-30 DIAGNOSIS — I2510 Atherosclerotic heart disease of native coronary artery without angina pectoris: Secondary | ICD-10-CM

## 2018-05-30 DIAGNOSIS — I480 Paroxysmal atrial fibrillation: Secondary | ICD-10-CM

## 2018-05-30 DIAGNOSIS — I10 Essential (primary) hypertension: Secondary | ICD-10-CM

## 2018-05-30 DIAGNOSIS — E785 Hyperlipidemia, unspecified: Secondary | ICD-10-CM

## 2018-05-30 DIAGNOSIS — Z7901 Long term (current) use of anticoagulants: Secondary | ICD-10-CM

## 2018-06-04 ENCOUNTER — Institutional Professional Consult (permissible substitution): Payer: Medicare Other | Admitting: Internal Medicine

## 2018-06-11 ENCOUNTER — Ambulatory Visit (INDEPENDENT_AMBULATORY_CARE_PROVIDER_SITE_OTHER): Payer: Medicare Other | Admitting: *Deleted

## 2018-06-11 DIAGNOSIS — Z7901 Long term (current) use of anticoagulants: Secondary | ICD-10-CM

## 2018-06-11 DIAGNOSIS — I4891 Unspecified atrial fibrillation: Secondary | ICD-10-CM

## 2018-06-11 DIAGNOSIS — Z5181 Encounter for therapeutic drug level monitoring: Secondary | ICD-10-CM | POA: Diagnosis not present

## 2018-06-11 LAB — POCT INR: INR: 3 (ref 2.0–3.0)

## 2018-06-11 NOTE — Patient Instructions (Signed)
Description    Continue on same dosage 1.5 tablets daily except 1 tablet on Sundays, Tuesdays, and Thursdays.  Recheck in 6 weeks.

## 2018-06-28 ENCOUNTER — Ambulatory Visit (INDEPENDENT_AMBULATORY_CARE_PROVIDER_SITE_OTHER): Payer: Medicare Other | Admitting: Pulmonary Disease

## 2018-06-28 ENCOUNTER — Ambulatory Visit (INDEPENDENT_AMBULATORY_CARE_PROVIDER_SITE_OTHER)
Admission: RE | Admit: 2018-06-28 | Discharge: 2018-06-28 | Disposition: A | Discharge status: Home / Self Care | Payer: Medicare Other | Source: Ambulatory Visit | Attending: Pulmonary Disease | Admitting: Pulmonary Disease

## 2018-06-28 ENCOUNTER — Encounter: Payer: Self-pay | Admitting: Pulmonary Disease

## 2018-06-28 VITALS — BP 112/72 | HR 65 | Ht 69.0 in | Wt 208.0 lb

## 2018-06-28 DIAGNOSIS — R0602 Shortness of breath: Secondary | ICD-10-CM

## 2018-06-28 DIAGNOSIS — Z23 Encounter for immunization: Secondary | ICD-10-CM | POA: Diagnosis not present

## 2018-06-28 DIAGNOSIS — I2510 Atherosclerotic heart disease of native coronary artery without angina pectoris: Secondary | ICD-10-CM | POA: Diagnosis not present

## 2018-06-28 NOTE — Progress Notes (Signed)
[name]    [id number]    [birth year]  Primary Care Physician:Howell, Bryn Gulling, MD  Referring Physician: Helane Rima, MD [address] [place], [place] [id number]  Chief complaint:   Shortness of breath Abnormal breathing when he is sleeping at night noted by family members  HPI:  Patient with a history of shortness of breath Abnormal breathing at night He had a recent sleep study which revealed mild obstructive sleep apnea with mild oxygen desaturations-he is pursuing an oral device at present Significant smoking history-to 3 packs a day 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 He is very active usually He gets short of breath walking up a flight of steps Gets short of breath with significant exertion  Denies a chronic cough Has no fevers or chills   Occupation: Trucking Smoking history: Reformed smoker  Outpatient Encounter Medications as of 06/28/2018  Medication Sig  . acetaminophen (TYLENOL) 500 MG tablet Take 1,000 mg by mouth daily.   Marland Kitchen allopurinol (ZYLOPRIM) 300 MG tablet TAKE 1 TABLET (300 MG TOTAL) BY MOUTH DAILY.  Marland Kitchen amLODipine (NORVASC) 10 MG tablet TAKE ONE TABLET BY MOUTH DAILY  . Ascorbic Acid (VITAMIN C) 500 MG tablet Take 1,000 mg by mouth daily.   Marland Kitchen atorvastatin (LIPITOR) 40 MG tablet Take 1 tablet (40 mg total) by mouth daily.  Marland Kitchen augmented betamethasone dipropionate (DIPROLENE-AF) 0.05 % cream Use as directed  . b complex vitamins tablet Take 1 tablet by mouth daily.   . cloNIDine (CATAPRES) 0.2 MG tablet Take 1 tablet (0.2 mg total) by mouth 2 (two) times daily.  . fish oil-omega-3 fatty acids 1000 MG capsule Take 1 g by mouth 2 (two) times daily.   . furosemide (LASIX) 20 MG tablet Take 1 tablet (20 mg total) by mouth daily.  Marland Kitchen  GLIPIZIDE PO Take 5 mg by mouth daily.   . isosorbide mononitrate (IMDUR) 30 MG 24 hr tablet TAKE 1 TABLET BY MOUTH DAILY  . Lancets (ONETOUCH ULTRASOFT) lancets   . lisinopril (PRINIVIL,ZESTRIL) 40 MG tablet TAKE 1 TABLET BY MOUTH DAILY.  . metFORMIN (GLUCOPHAGE-XR) 500 MG 24 hr tablet Take 2 tablets by mouth 2 (two) times daily. Take 2 tabs twice daily  . metoprolol succinate (TOPROL-XL) 100 MG 24 hr tablet TAKE ONE TABLET BY MOUTH DAILY  . Misc Natural Products (GLUCOSAMINE CHOND MSM FORMULA PO) Take 2 capsules by mouth daily.  . Multiple Vitamin (MULTIVITAMIN WITH MINERALS) TABS tablet Take 1 tablet by mouth daily.  . nitroGLYCERIN (NITROSTAT) 0.4 MG SL tablet PLACE 1 TABLET (0.4 MG TOTAL) UNDER THE TONGUE EVERY 5  MINUTES AS NEEDED FOR CHEST PAIN.  . ONE TOUCH ULTRA TEST test strip   . warfarin (COUMADIN) 5 MG tablet TAKE 1 TO 1 AND 1/2 TABLETS BY MOUTH DAILY AS DIRECTED BY COUMADIN CLINIC  . cromolyn (NASALCROM) 5.2 MG/ACT nasal spray Place 1 spray into the nose 3 (three) times daily.   No facility-administered encounter medications on file as of 06/28/2018.     Allergies as of 06/28/2018 - Review Complete 06/28/2018  Allergen Reaction Noted  . Cephalexin Rash 12/20/2010    Past Medical History:  Diagnosis Date  . Chest pain July 2012  s/p repeat cath; stable anatomy. Managed medically  . Coronary artery disease   . Current use of long term anticoagulation   . Diabetes mellitus   . Dyslipidemia   . Edema   . Full dentures   . Gout   . History of acute myocardial infarction 11/2005   acute anteroseptal myocardial infarction  . Hyperlipidemia   . Hypertension   . Osteoarthritis   . Paroxysmal atrial fibrillation (HCC)   . Wears glasses     Past Surgical History:  Procedure Laterality Date  . CARDIAC CATHETERIZATION  05/18/2009  . CARDIAC CATHETERIZATION  July 2012   Stable anatomy. Manage medically  . COLONOSCOPY    . CORONARY ARTERY BYPASS GRAFT  2005  .  DEBRIDEMENT AND CLOSURE WOUND Left 06/18/2014   Procedure: CLOSURE OF OPEN WOUND OF LEFT CHEEK/POSSIBLE CHEEK ADVANCEMENT/ROTATION;  Surgeon: Theodoro Kos, DO;  Location: [facility];  Service: Plastics;  Laterality: Left;  . INCISION AND DRAINAGE OF WOUND Left 07/17/2014   Procedure: IRRIGATION AND DEBRIDEMENT LEFT CHEEK WOUND WITH PLACEMENT OF ACELL;  Surgeon: Theodoro Kos, DO;  Location: [facility];  Service: Plastics;  Laterality: Left;  . TONSILLECTOMY      Family History  Problem Relation Age of Onset  . Coronary artery disease Mother   . Heart disease Mother   . Coronary artery disease Father   . Heart disease Father   . Cancer Brother   . Cancer Brother     Social History   Socioeconomic History  . Marital status: Single    Spouse name: Not on file  . Number of children: Not on file  . Years of education: Not on file  . Highest education level: Not on file  Occupational History  . Not on file  Social Needs  . Financial resource strain: Not on file  . Food insecurity:    Worry: Not on file    Inability: Not on file  . Transportation needs:    Medical: Not on file    Non-medical: Not on file  Tobacco Use  . Smoking status: Former Smoker    Types: Cigarettes    Start date: 09/28/1962    Last attempt to quit: 09/05/1997    Years since quitting: 20.8  . Smokeless tobacco: Never Used  Substance and Sexual Activity  . Alcohol use: No  . Drug use: No  . Sexual activity: Never  Lifestyle  . Physical activity:    Days per week: Not on file    Minutes per session: Not on file  . Stress: Not on file  Relationships  . Social connections:    Talks on phone: Not on file    Gets together: Not on file    Attends religious service: Not on file    Active member of club or organization: Not on file    Attends meetings of clubs or organizations: Not on file    Relationship status: Not on file  . Intimate partner violence:    Fear of current  or ex partner: Not on file    Emotionally abused: Not on file    Physically abused: Not on file    Forced sexual activity: Not on file  Other Topics Concern  . Not on file  Social History Narrative  . Not on file    Review of Systems  Constitutional: Negative for fatigue.  Eyes: Negative.   Respiratory: Positive for shortness of breath. Negative for apnea and wheezing.   Cardiovascular:  Negative.  Negative for chest pain and leg swelling.  Gastrointestinal: Negative.   Endocrine: Negative.   Genitourinary: Negative.   Musculoskeletal: Negative.   Skin: Negative.     Vitals:   06/28/18 1156  BP: 112/72  Pulse: 65  SpO2: 95%     Physical Exam  Constitutional: He is oriented to person, place, and time. He appears well-developed and well-nourished. No distress.  Overweight  HENT:  Head: Normocephalic and atraumatic.  Eyes: Pupils are equal, round, and reactive to light. Conjunctivae and EOM are normal. Right eye exhibits no discharge. Left eye exhibits no discharge.  Neck: Normal range of motion. Neck supple. No thyromegaly present.  Cardiovascular: Normal rate, regular rhythm and normal heart sounds.  Pulmonary/Chest: Effort normal and breath sounds normal. No respiratory distress. He has no wheezes. He has no rales.  Abdominal: Soft. Bowel sounds are normal. He exhibits no distension. There is no tenderness.  Musculoskeletal: Normal range of motion. He exhibits no edema or deformity.  Neurological: He is alert and oriented to person, place, and time. No cranial nerve deficit.  Skin: Skin is warm and dry. No rash noted. He is not diaphoretic. No erythema.  Psychiatric: He has a normal mood and affect. His behavior is normal.   Data Reviewed: Recent sleep study reviewed showing mild obstructive sleep apnea with mild oxygen desaturations  Echocardiogram from 2015 revealed ejection fraction of 60 to 67%, diastolic dysfunction Assessment:  Shortness of  breath -Multifactorial be related to heart disease, may also be related to lung disease  Mild obstructive sleep apnea -Sleep study reviewed  Obesity Significant smoking history Significant history of heart disease, diastolic dysfunction, atrial fibrillation in the past  Plan/Recommendations: .  Obtain pulmonary function study .  Obtain echocardiogram to assess cardiac function and right-sided cardiac function .  My recommendation for treatment of mild sleep apnea with his significant comorbidities will be CPAP therapy Patient has had previous discussions with respect to an oral device-I did encourage him to discuss this further as CPAP would be a better treatment .  Bronchodilators may be needed depending on findings on the pulmonary function tests .  Regular exercises discussed at least 30 minutes a day .  Encouraged to call if any significant concerns .  I will see  You back in the office in about 4 to 6 weeks.   Sherrilyn Rist MD [facility] 06/28/2018, 12:27 PM  CC: Helane Rima, MD

## 2018-06-28 NOTE — Patient Instructions (Signed)
Shortness of breath Likely multifactorial-history of smoking in the past, history of heart disease  Obstructive sleep apnea  My recommendation for obstructive sleep apnea will be CPAP therapy rather than an oral device because of the comorbidities  We will obtain a pulmonary function test Obtain an echocardiogram Obtain a chest x-ray  I will see her back in the office in about 4 to 6 weeks  Call with any significant concerns

## 2018-07-04 ENCOUNTER — Other Ambulatory Visit: Payer: Self-pay

## 2018-07-04 ENCOUNTER — Ambulatory Visit (HOSPITAL_COMMUNITY): Payer: Medicare Other | Attending: Cardiovascular Disease

## 2018-07-04 DIAGNOSIS — R0602 Shortness of breath: Secondary | ICD-10-CM | POA: Diagnosis not present

## 2018-07-04 MED ORDER — PERFLUTREN LIPID MICROSPHERE
1.0000 mL | INTRAVENOUS | Status: AC | PRN
  Administered 2018-07-04: 1 mL via INTRAVENOUS

## 2018-07-23 ENCOUNTER — Ambulatory Visit (INDEPENDENT_AMBULATORY_CARE_PROVIDER_SITE_OTHER): Payer: Medicare Other

## 2018-07-23 DIAGNOSIS — Z7901 Long term (current) use of anticoagulants: Secondary | ICD-10-CM | POA: Diagnosis not present

## 2018-07-23 DIAGNOSIS — Z5181 Encounter for therapeutic drug level monitoring: Secondary | ICD-10-CM | POA: Diagnosis not present

## 2018-07-23 DIAGNOSIS — I4891 Unspecified atrial fibrillation: Secondary | ICD-10-CM

## 2018-07-23 LAB — POCT INR: INR: 3.6 — AB (ref 2.0–3.0)

## 2018-07-23 NOTE — Patient Instructions (Signed)
Description   Skip today's dosage of Coumadin, then resume same dosage 1.5 tablets daily except 1 tablet on Sundays, Tuesdays, and Thursdays.  Recheck in 4 weeks.

## 2018-08-08 ENCOUNTER — Encounter: Payer: Self-pay | Admitting: Pulmonary Disease

## 2018-08-08 ENCOUNTER — Ambulatory Visit: Payer: Medicare Other | Admitting: Pulmonary Disease

## 2018-08-08 ENCOUNTER — Ambulatory Visit (INDEPENDENT_AMBULATORY_CARE_PROVIDER_SITE_OTHER): Payer: Medicare Other | Admitting: Pulmonary Disease

## 2018-08-08 VITALS — BP 140/78 | HR 50 | Ht 66.25 in | Wt 208.6 lb

## 2018-08-08 DIAGNOSIS — R0602 Shortness of breath: Secondary | ICD-10-CM

## 2018-08-08 DIAGNOSIS — J449 Chronic obstructive pulmonary disease, unspecified: Secondary | ICD-10-CM

## 2018-08-08 LAB — PULMONARY FUNCTION TEST
FEF 25-75 POST: 1.22 L/s
FEF 25-75 Pre: 0.86 L/sec
FEF2575-%Change-Post: 42 %
FEF2575-%Pred-Post: 66 %
FEF2575-%Pred-Pre: 46 %
FEV1-%Change-Post: 10 %
FEV1-%PRED-PRE: 62 %
FEV1-%Pred-Post: 69 %
FEV1-POST: 1.77 L
FEV1-PRE: 1.6 L
FEV1FVC-%Change-Post: 0 %
FEV1FVC-%PRED-PRE: 90 %
FEV6-%Change-Post: 11 %
FEV6-%PRED-PRE: 72 %
FEV6-%Pred-Post: 80 %
FEV6-POST: 2.69 L
FEV6-Pre: 2.42 L
FEV6FVC-%CHANGE-POST: 1 %
FEV6FVC-%PRED-POST: 107 %
FEV6FVC-%PRED-PRE: 105 %
FVC-%Change-Post: 10 %
FVC-%PRED-POST: 75 %
FVC-%PRED-PRE: 68 %
FVC-POST: 2.7 L
FVC-PRE: 2.45 L
POST FEV6/FVC RATIO: 100 %
PRE FEV1/FVC RATIO: 65 %
PRE FEV6/FVC RATIO: 99 %
Post FEV1/FVC ratio: 66 %

## 2018-08-08 MED ORDER — UMECLIDINIUM-VILANTEROL 62.5-25 MCG/INH IN AEPB: 1.0000 | INHALATION_SPRAY | Freq: Every day | RESPIRATORY_TRACT | 6 refills | Status: DC

## 2018-08-08 MED ORDER — ALBUTEROL SULFATE HFA 108 (90 BASE) MCG/ACT IN AERS: 2.0000 | INHALATION_SPRAY | Freq: Four times a day (QID) | RESPIRATORY_TRACT | 6 refills | Status: DC | PRN

## 2018-08-08 MED ORDER — UMECLIDINIUM-VILANTEROL 62.5-25 MCG/INH IN AEPB: 1.0000 | INHALATION_SPRAY | Freq: Every day | RESPIRATORY_TRACT | Status: DC

## 2018-08-08 NOTE — Progress Notes (Signed)
[name]    [id number]    November 11, 1940  Primary Care Physician:Howell, Bryn Gulling, MD  Referring Physician: Helane Rima, MD [place] [address] [place], [place] [address]  Chief complaint:   Shortness of breath Abnormal breathing when he is sleeping at night noted by family members-witnessed apneas noted  HPI:  Mild obstructive sleep apnea Abnormal PFT showing moderate obstructive lung disease  Patient with a history of shortness of breath Abnormal breathing at night He had a recent sleep study which revealed mild obstructive sleep apnea  Significant smoking history-to 3 packs a day 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 He is very active usually He gets short of breath walking up a flight of steps Gets short of breath with significant exertion  Denies a chronic cough Has no fevers or chills   Occupation: Trucking Smoking history: Reformed smoker  Outpatient Encounter Medications as of 08/08/2018  Medication Sig  . acetaminophen (TYLENOL) 500 MG tablet Take 1,000 mg by mouth daily.   Marland Kitchen allopurinol (ZYLOPRIM) 300 MG tablet TAKE 1 TABLET (300 MG TOTAL) BY MOUTH DAILY.  Marland Kitchen amLODipine (NORVASC) 10 MG tablet TAKE ONE TABLET BY MOUTH DAILY  . Ascorbic Acid (VITAMIN C) 500 MG tablet Take 1,000 mg by mouth daily.   Marland Kitchen atorvastatin (LIPITOR) 40 MG tablet Take 1 tablet (40 mg total) by mouth daily.  Marland Kitchen augmented betamethasone dipropionate (DIPROLENE-AF) 0.05 % cream Use as directed  . b complex vitamins tablet Take 1 tablet by mouth daily.   . cloNIDine (CATAPRES) 0.2 MG tablet Take 1 tablet (0.2 mg total) by mouth 2 (two) times daily.  . fish oil-omega-3 fatty acids 1000 MG capsule Take 1 g by mouth 2 (two) times daily.   . furosemide (LASIX) 20 MG tablet Take 1  tablet (20 mg total) by mouth daily.  Marland Kitchen GLIPIZIDE PO Take 5 mg by mouth daily.   . isosorbide mononitrate (IMDUR) 30 MG 24 hr tablet TAKE 1 TABLET BY MOUTH DAILY  . Lancets (ONETOUCH ULTRASOFT) lancets   . lisinopril (PRINIVIL,ZESTRIL) 40 MG tablet TAKE 1 TABLET BY MOUTH DAILY.  . metFORMIN (GLUCOPHAGE-XR) 500 MG 24 hr tablet Take 2 tablets by mouth 2 (two) times daily. Take 2 tabs twice daily  . metoprolol succinate (TOPROL-XL) 100 MG 24 hr tablet TAKE ONE TABLET BY MOUTH DAILY  . Misc Natural Products (GLUCOSAMINE CHOND MSM FORMULA PO) Take 2 capsules by mouth daily.  . Multiple Vitamin (MULTIVITAMIN WITH MINERALS) TABS tablet Take 1 tablet by mouth daily.  . nitroGLYCERIN (NITROSTAT) 0.4 MG SL tablet PLACE 1 TABLET (0.4 MG TOTAL) UNDER THE TONGUE EVERY 5  MINUTES AS NEEDED FOR CHEST PAIN.  . ONE TOUCH ULTRA TEST test strip   . warfarin (COUMADIN) 5 MG tablet TAKE 1 TO 1 AND 1/2 TABLETS BY MOUTH DAILY AS DIRECTED BY COUMADIN CLINIC  . cromolyn (NASALCROM) 5.2 MG/ACT nasal spray Place 1 spray into the nose 3 (three) times daily.   No facility-administered encounter medications on file as of 08/08/2018.     Allergies as of 08/08/2018 - Review Complete 08/08/2018  Allergen Reaction Noted  . Cephalexin Rash 12/20/2010    Past Medical History:  Diagnosis Date  . Chest pain  July 2012   s/p repeat cath; stable anatomy. Managed medically  . Coronary artery disease   . Current use of long term anticoagulation   . Diabetes mellitus   . Dyslipidemia   . Edema   . Full dentures   . Gout   . History of acute myocardial infarction 11/2005   acute anteroseptal myocardial infarction  . Hyperlipidemia   . Hypertension   . Osteoarthritis   . Paroxysmal atrial fibrillation (HCC)   . Wears glasses     Past Surgical History:  Procedure Laterality Date  . CARDIAC CATHETERIZATION  05/18/2009  . CARDIAC CATHETERIZATION  July 2012   Stable anatomy. Manage medically  . COLONOSCOPY    . CORONARY  ARTERY BYPASS GRAFT  2005  . DEBRIDEMENT AND CLOSURE WOUND Left 06/18/2014   Procedure: CLOSURE OF OPEN WOUND OF LEFT CHEEK/POSSIBLE CHEEK ADVANCEMENT/ROTATION;  Surgeon: Theodoro Kos, DO;  Location: [facility];  Service: Plastics;  Laterality: Left;  . INCISION AND DRAINAGE OF WOUND Left 07/17/2014   Procedure: IRRIGATION AND DEBRIDEMENT LEFT CHEEK WOUND WITH PLACEMENT OF ACELL;  Surgeon: Theodoro Kos, DO;  Location: [facility];  Service: Plastics;  Laterality: Left;  . TONSILLECTOMY      Family History  Problem Relation Age of Onset  . Coronary artery disease Mother   . Heart disease Mother   . Coronary artery disease Father   . Heart disease Father   . Cancer Brother   . Cancer Brother     Social History   Socioeconomic History  . Marital status: Single    Spouse name: Not on file  . Number of children: Not on file  . Years of education: Not on file  . Highest education level: Not on file  Occupational History  . Not on file  Social Needs  . Financial resource strain: Not on file  . Food insecurity:    Worry: Not on file    Inability: Not on file  . Transportation needs:    Medical: Not on file    Non-medical: Not on file  Tobacco Use  . Smoking status: Former Smoker    Types: Cigarettes    Start date: 09/28/1962    Last attempt to quit: 09/05/1997    Years since quitting: 20.9  . Smokeless tobacco: Never Used  Substance and Sexual Activity  . Alcohol use: No  . Drug use: No  . Sexual activity: Never  Lifestyle  . Physical activity:    Days per week: Not on file    Minutes per session: Not on file  . Stress: Not on file  Relationships  . Social connections:    Talks on phone: Not on file    Gets together: Not on file    Attends religious service: Not on file    Active member of club or organization: Not on file    Attends meetings of clubs or organizations: Not on file    Relationship status: Not on file  . Intimate partner  violence:    Fear of current or ex partner: Not on file    Emotionally abused: Not on file    Physically abused: Not on file    Forced sexual activity: Not on file  Other Topics Concern  . Not on file  Social History Narrative  . Not on file    Review of Systems  Constitutional: Negative for fatigue.  Eyes: Negative.   Respiratory: Positive for shortness of breath. Negative for apnea and  wheezing.   Cardiovascular: Negative.  Negative for chest pain and leg swelling.  Gastrointestinal: Negative.   Endocrine: Negative.   Skin: Negative.   All other systems reviewed and are negative.   Vitals:   08/08/18 1331  BP: 140/78  Pulse: (!) 50  SpO2: 96%     Physical Exam  Constitutional: He appears well-developed and well-nourished. No distress.  Overweight  HENT:  Head: Normocephalic and atraumatic.  Eyes: Pupils are equal, round, and reactive to light. Conjunctivae and EOM are normal. Right eye exhibits no discharge. Left eye exhibits no discharge.  Neck: Normal range of motion. Neck supple. No thyromegaly present.  Cardiovascular: Normal rate, regular rhythm and normal heart sounds.  Pulmonary/Chest: Effort normal and breath sounds normal. No respiratory distress. He has no wheezes. He has no rales.  Abdominal: Soft. Bowel sounds are normal. He exhibits no distension. There is no tenderness.  Skin: He is not diaphoretic.  Psychiatric: He has a normal mood and affect. His behavior is normal.   Data Reviewed: Recent sleep study reviewed showing mild obstructive sleep apnea with mild oxygen desaturations  Echocardiogram from 2015 revealed ejection fraction of 60 to 28%, diastolic dysfunction  Pulmonary function study does reveal moderate obstructive disease  Assessment:  Shortness of breath -Likely related to moderate obstructive lung disease  Mild obstructive sleep apnea -Sleep study reviewed  Obesity Significant smoking history Significant history of heart disease,  diastolic dysfunction, atrial fibrillation in the past  Plan/Recommendations: .  We will start the patient on Anoro and albuterol to be used as needed  .  Agrees to CPAP-we will contact the DME company to set him up with CPAP  .  Autotitrating CPAP with settings of 5-15, follow-up with compliance monitoring  .  Regular exercises discussed at least 30 minutes a day  .  Encouraged to call if any significant concerns  .  I will see  You back in the office in about 3 months.   Sherrilyn Rist MD [facility] 08/08/2018, 1:44 PM  CC: Helane Rima, MD

## 2018-08-08 NOTE — Patient Instructions (Addendum)
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

## 2018-08-10 ENCOUNTER — Other Ambulatory Visit: Payer: Self-pay

## 2018-08-10 MED ORDER — WARFARIN SODIUM 5 MG PO TABS: ORAL_TABLET | ORAL | 1 refills | Status: DC

## 2018-08-13 ENCOUNTER — Other Ambulatory Visit: Payer: Self-pay | Admitting: Cardiology

## 2018-08-13 DIAGNOSIS — I480 Paroxysmal atrial fibrillation: Secondary | ICD-10-CM

## 2018-08-13 DIAGNOSIS — I10 Essential (primary) hypertension: Secondary | ICD-10-CM

## 2018-08-13 DIAGNOSIS — Z7901 Long term (current) use of anticoagulants: Secondary | ICD-10-CM

## 2018-08-13 DIAGNOSIS — E785 Hyperlipidemia, unspecified: Secondary | ICD-10-CM

## 2018-08-13 DIAGNOSIS — I2510 Atherosclerotic heart disease of native coronary artery without angina pectoris: Secondary | ICD-10-CM

## 2018-08-15 ENCOUNTER — Other Ambulatory Visit: Payer: Self-pay

## 2018-08-15 DIAGNOSIS — I10 Essential (primary) hypertension: Secondary | ICD-10-CM

## 2018-08-15 DIAGNOSIS — E785 Hyperlipidemia, unspecified: Secondary | ICD-10-CM

## 2018-08-15 DIAGNOSIS — Z7901 Long term (current) use of anticoagulants: Secondary | ICD-10-CM

## 2018-08-15 DIAGNOSIS — I2510 Atherosclerotic heart disease of native coronary artery without angina pectoris: Secondary | ICD-10-CM

## 2018-08-15 DIAGNOSIS — I480 Paroxysmal atrial fibrillation: Secondary | ICD-10-CM

## 2018-08-15 MED ORDER — METOPROLOL SUCCINATE ER 100 MG PO TB24: 100.0000 mg | ORAL_TABLET | Freq: Every day | ORAL | 3 refills | Status: DC

## 2018-08-20 ENCOUNTER — Ambulatory Visit (INDEPENDENT_AMBULATORY_CARE_PROVIDER_SITE_OTHER): Payer: Medicare Other | Admitting: *Deleted

## 2018-08-20 DIAGNOSIS — Z7901 Long term (current) use of anticoagulants: Secondary | ICD-10-CM | POA: Diagnosis not present

## 2018-08-20 DIAGNOSIS — I4891 Unspecified atrial fibrillation: Secondary | ICD-10-CM

## 2018-08-20 DIAGNOSIS — Z5181 Encounter for therapeutic drug level monitoring: Secondary | ICD-10-CM

## 2018-08-20 LAB — POCT INR: INR: 2.8 (ref 2.0–3.0)

## 2018-08-20 NOTE — Patient Instructions (Signed)
Description   Continue taking  same dosage 1.5 tablets daily except 1 tablet on Sundays, Tuesdays, and Thursdays.  Recheck in 4 weeks. Call us with any medication changes or concerns # [phone] [facility] Main # [phone].

## 2018-08-24 NOTE — Progress Notes (Signed)
lmom 

## 2018-09-12 ENCOUNTER — Encounter: Payer: Self-pay | Admitting: Pulmonary Disease

## 2018-09-12 ENCOUNTER — Ambulatory Visit (INDEPENDENT_AMBULATORY_CARE_PROVIDER_SITE_OTHER): Payer: Medicare Other | Admitting: Pulmonary Disease

## 2018-09-12 VITALS — BP 126/68 | HR 63 | Ht 66.25 in | Wt 213.4 lb

## 2018-09-12 DIAGNOSIS — R0602 Shortness of breath: Secondary | ICD-10-CM | POA: Diagnosis not present

## 2018-09-12 DIAGNOSIS — G4733 Obstructive sleep apnea (adult) (pediatric): Secondary | ICD-10-CM | POA: Diagnosis not present

## 2018-09-12 NOTE — Addendum Note (Signed)
Addended by: Parke Poisson E on: 09/12/2018 11:07 AM   Modules accepted: Orders

## 2018-09-12 NOTE — Patient Instructions (Signed)
COPD Obstructive sleep apnea  We will try and initiate contact with a DME company to start you on your CPAP  I will see you back in the office in about 3 months  Continue using your rescue inhaler as needed With significant concerns

## 2018-09-12 NOTE — Progress Notes (Signed)
[name]    [id number]    Jan 02, 1941  Primary Care Physician:Howell, Bryn Gulling, MD  Referring Physician: Helane Rima, MD [address] [place], [place] [address]  Chief complaint:   Shortness of breath Abnormal breathing when he is sleeping at night noted by family members-witnessed apneas noted  HPI: Symptoms have been relatively stable with no new symptoms Breathing feels relatively stable Gets short of breath with walking uphill or walking up steps  Mild obstructive sleep apnea Abnormal PFT showing moderate obstructive lung disease  Patient with a history of shortness of breath  Abnormal breathing at night He had a recent sleep study which revealed mild obstructive sleep apnea  Significant smoking history-to 3 packs a day 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 He is very active usually He gets short of breath walking up a flight of steps Gets short of breath with significant exertion  Denies a chronic cough Has no fevers or chills   Occupation: Trucking Smoking history: Reformed smoker  Outpatient Encounter Medications as of 09/12/2018  Medication Sig  . acetaminophen (TYLENOL) 500 MG tablet Take 1,000 mg by mouth daily.   Marland Kitchen albuterol (PROVENTIL HFA;VENTOLIN HFA) 108 (90 Base) MCG/ACT inhaler Inhale 2 puffs into the lungs every 6 (six) hours as needed for wheezing or shortness of breath.  . allopurinol (ZYLOPRIM) 300 MG tablet TAKE 1 TABLET (300 MG TOTAL) BY MOUTH DAILY.  Marland Kitchen amLODipine (NORVASC) 10 MG tablet TAKE ONE TABLET BY MOUTH DAILY  . Ascorbic Acid (VITAMIN C) 500 MG tablet Take 1,000 mg by mouth daily.   Marland Kitchen atorvastatin (LIPITOR) 40 MG tablet Take 1 tablet (40 mg total) by mouth daily.  Marland Kitchen augmented betamethasone dipropionate  (DIPROLENE-AF) 0.05 % cream Use as directed  . b complex vitamins tablet Take 1 tablet by mouth daily.   . cloNIDine (CATAPRES) 0.2 MG tablet Take 1 tablet (0.2 mg total) by mouth 2 (two) times daily.  . fish oil-omega-3 fatty acids 1000 MG capsule Take 1 g by mouth 2 (two) times daily.   . furosemide (LASIX) 20 MG tablet Take 1 tablet (20 mg total) by mouth daily.  Marland Kitchen GLIPIZIDE PO Take 5 mg by mouth daily.   . isosorbide mononitrate (IMDUR) 30 MG 24 hr tablet TAKE 1 TABLET BY MOUTH DAILY  . Lancets (ONETOUCH ULTRASOFT) lancets   . lisinopril (PRINIVIL,ZESTRIL) 40 MG tablet TAKE 1 TABLET BY MOUTH DAILY.  . metFORMIN (GLUCOPHAGE-XR) 500 MG 24 hr tablet Take 2 tablets by mouth 2 (two) times daily. Take 2 tabs twice daily  . metoprolol succinate (TOPROL-XL) 100 MG 24 hr tablet Take 1 tablet (100 mg total) by mouth daily. Take with or immediately following a meal.  . Misc Natural Products (GLUCOSAMINE CHOND MSM FORMULA PO) Take 2 capsules by mouth daily.  . Multiple Vitamin (MULTIVITAMIN WITH MINERALS) TABS tablet Take 1 tablet by mouth daily.  . nitroGLYCERIN (NITROSTAT) 0.4 MG SL tablet PLACE 1 TABLET (0.4 MG TOTAL) UNDER THE TONGUE EVERY 5  MINUTES AS NEEDED FOR CHEST PAIN.  . ONE TOUCH ULTRA TEST test strip   . umeclidinium-vilanterol (ANORO ELLIPTA) 62.5-25 MCG/INH AEPB Inhale 1 puff into the lungs daily.  Marland Kitchen warfarin (COUMADIN) 5 MG  tablet TAKE 1 TO 1 AND 1/2 TABLETS BY MOUTH DAILY AS DIRECTED BY COUMADIN CLINIC  . [DISCONTINUED] cromolyn (NASALCROM) 5.2 MG/ACT nasal spray Place 1 spray into the nose 3 (three) times daily.   No facility-administered encounter medications on file as of 09/12/2018.     Allergies as of 09/12/2018 - Review Complete 09/12/2018  Allergen Reaction Noted  . Cephalexin Rash 12/20/2010    Past Medical History:  Diagnosis Date  . Chest pain July 2012   s/p repeat cath; stable anatomy. Managed medically  . Coronary artery disease   . Current use of long term  anticoagulation   . Diabetes mellitus   . Dyslipidemia   . Edema   . Full dentures   . Gout   . History of acute myocardial infarction 11/2005   acute anteroseptal myocardial infarction  . Hyperlipidemia   . Hypertension   . Osteoarthritis   . Paroxysmal atrial fibrillation (HCC)   . Wears glasses     Past Surgical History:  Procedure Laterality Date  . CARDIAC CATHETERIZATION  05/18/2009  . CARDIAC CATHETERIZATION  July 2012   Stable anatomy. Manage medically  . COLONOSCOPY    . CORONARY ARTERY BYPASS GRAFT  2005  . DEBRIDEMENT AND CLOSURE WOUND Left 06/18/2014   Procedure: CLOSURE OF OPEN WOUND OF LEFT CHEEK/POSSIBLE CHEEK ADVANCEMENT/ROTATION;  Surgeon: Theodoro Kos, DO;  Location: [facility];  Service: Plastics;  Laterality: Left;  . INCISION AND DRAINAGE OF WOUND Left 07/17/2014   Procedure: IRRIGATION AND DEBRIDEMENT LEFT CHEEK WOUND WITH PLACEMENT OF ACELL;  Surgeon: Theodoro Kos, DO;  Location: [facility];  Service: Plastics;  Laterality: Left;  . TONSILLECTOMY      Family History  Problem Relation Age of Onset  . Coronary artery disease Mother   . Heart disease Mother   . Coronary artery disease Father   . Heart disease Father   . Cancer Brother   . Cancer Brother     Social History   Socioeconomic History  . Marital status: Single    Spouse name: Not on file  . Number of children: Not on file  . Years of education: Not on file  . Highest education level: Not on file  Occupational History  . Not on file  Social Needs  . Financial resource strain: Not on file  . Food insecurity:    Worry: Not on file    Inability: Not on file  . Transportation needs:    Medical: Not on file    Non-medical: Not on file  Tobacco Use  . Smoking status: Former Smoker    Types: Cigarettes    Start date: 09/28/1962    Last attempt to quit: 1989    Years since quitting: 31.0  . Smokeless tobacco: Never Used  Substance and Sexual Activity    . Alcohol use: No  . Drug use: No  . Sexual activity: Never  Lifestyle  . Physical activity:    Days per week: Not on file    Minutes per session: Not on file  . Stress: Not on file  Relationships  . Social connections:    Talks on phone: Not on file    Gets together: Not on file    Attends religious service: Not on file    Active member of club or organization: Not on file    Attends meetings of clubs or organizations: Not on file    Relationship status: Not on file  . Intimate partner violence:  Fear of current or ex partner: Not on file    Emotionally abused: Not on file    Physically abused: Not on file    Forced sexual activity: Not on file  Other Topics Concern  . Not on file  Social History Narrative  . Not on file    Review of Systems  Constitutional: Negative for fatigue.  HENT: Negative.   Eyes: Negative.   Respiratory: Positive for shortness of breath. Negative for apnea, cough and wheezing.   Cardiovascular: Negative.  Negative for chest pain and leg swelling.  Gastrointestinal: Negative.   Endocrine: Negative.   Skin: Negative.   Psychiatric/Behavioral: Positive for sleep disturbance.  All other systems reviewed and are negative.   Vitals:   09/12/18 0930  BP: 126/68  Pulse: 63  SpO2: 95%     Physical Exam  Constitutional: He appears well-developed and well-nourished. No distress.  Overweight  HENT:  Head: Normocephalic and atraumatic.  Eyes: Pupils are equal, round, and reactive to light. Conjunctivae and EOM are normal. Right eye exhibits no discharge. Left eye exhibits no discharge.  Neck: Normal range of motion. Neck supple. No thyromegaly present.  Cardiovascular: Normal rate, regular rhythm and normal heart sounds.  Pulmonary/Chest: Effort normal and breath sounds normal. No respiratory distress. He has no wheezes. He has no rales.  Reduced air movement bilaterally  Abdominal: Soft. Bowel sounds are normal. He exhibits no distension.  There is no abdominal tenderness.  Skin: He is not diaphoretic.  Psychiatric: He has a normal mood and affect. His behavior is normal.   Data Reviewed: Recent sleep study reviewed showing mild obstructive sleep apnea with mild oxygen desaturations  Echocardiogram from 2015 revealed ejection fraction of 60 to 83%, diastolic dysfunction  Pulmonary function study does reveal moderate obstructive disease Results of the Epworth flowsheet 09/12/2018  Sitting and reading 0  Watching TV 2  Sitting, inactive in a public place (e.g. a theatre or a meeting) 0  As a passenger in a car for an hour without a break 0  Lying down to rest in the afternoon when circumstances permit 3  Sitting and talking to someone 0  Sitting quietly after a lunch without alcohol 2  In a car, while stopped for a few minutes in traffic 0  Total score 7    Assessment:  Shortness of breath -Likely related to moderate obstructive lung disease -He was using Anoro but finds it very expensive -We will not be able to afford it -We will look into help we can offer to him -Otherwise to continue using rescue inhaler as needed  Mild obstructive sleep apnea -Sleep study reviewed -We will initiate CPAP therapy  Obesity Significant smoking history Significant history of heart disease, diastolic dysfunction, atrial fibrillation in the past  Plan/Recommendations: .  Continue albuterol as needed, Anoro if he can afford it  .  Agrees to CPAP-we will contact the DME company to set him up with CPAP  .  Autotitrating CPAP with settings of 5-15, follow-up with compliance monitoring  .  Regular exercises discussed at least 30 minutes a day-as able, challenging himself with walking uphill versus steps will also help  .  Encouraged to call if any significant concerns  .  I will see him back in 3 months   Sherrilyn Rist MD [facility] 09/12/2018, 9:36 AM  CC: Helane Rima, MD

## 2018-09-14 ENCOUNTER — Telehealth: Payer: Self-pay | Admitting: Pulmonary Disease

## 2018-09-14 DIAGNOSIS — G4733 Obstructive sleep apnea (adult) (pediatric): Secondary | ICD-10-CM

## 2018-09-14 NOTE — Telephone Encounter (Signed)
Called [facility] and spoke with Barbaraann Rondo. Per Barbaraann Rondo, pt's sleep study that he had performed is too old and pt is going to have to have another sleep study performed.  Dr. Ander Slade, please advise if you want [place] to order a HST or if pt will need to have the in-lab study. Thanks!

## 2018-09-17 ENCOUNTER — Ambulatory Visit (INDEPENDENT_AMBULATORY_CARE_PROVIDER_SITE_OTHER): Payer: Medicare Other | Admitting: *Deleted

## 2018-09-17 ENCOUNTER — Encounter (INDEPENDENT_AMBULATORY_CARE_PROVIDER_SITE_OTHER): Payer: Self-pay

## 2018-09-17 DIAGNOSIS — I4891 Unspecified atrial fibrillation: Secondary | ICD-10-CM | POA: Diagnosis not present

## 2018-09-17 DIAGNOSIS — Z5181 Encounter for therapeutic drug level monitoring: Secondary | ICD-10-CM | POA: Diagnosis not present

## 2018-09-17 DIAGNOSIS — Z7901 Long term (current) use of anticoagulants: Secondary | ICD-10-CM | POA: Diagnosis not present

## 2018-09-17 LAB — POCT INR: INR: 3.2 — AB (ref 2.0–3.0)

## 2018-09-17 NOTE — Patient Instructions (Signed)
Description   Today take 1 tablet then continue taking  same dosage 1.5 tablets daily except 1 tablet on Sundays, Tuesdays, and Thursdays.  Recheck in 3 weeks. Call us with any medication changes or concerns # [phone] [facility] Main # [phone].

## 2018-09-17 NOTE — Telephone Encounter (Signed)
Spoke with patient. He is aware that [facility] is requiring him to have another SS. Patient is ok with having a home sleep test. Advised patient of the process. He verbalized understanding. Order has been placed. Nothing further needed at time of call.

## 2018-09-17 NOTE — Telephone Encounter (Signed)
We can schedule a home sleep study

## 2018-09-24 ENCOUNTER — Telehealth: Payer: Self-pay | Admitting: Pulmonary Disease

## 2018-09-24 NOTE — Telephone Encounter (Signed)
Patient stated he needed the number to [name], nutrition office in [place]. No referrals in chart, and no upcoming appointments in [facility] for nutrition.  Patient stated that he needed to cancel appointment with them for 09/24/18.  He was unsure of office.  He stated that he was going to call around and see if he could find there number. Nothing further at this time.

## 2018-09-28 DIAGNOSIS — G4733 Obstructive sleep apnea (adult) (pediatric): Secondary | ICD-10-CM

## 2018-09-30 DIAGNOSIS — G4710 Hypersomnia, unspecified: Secondary | ICD-10-CM | POA: Diagnosis not present

## 2018-10-04 DIAGNOSIS — G4710 Hypersomnia, unspecified: Secondary | ICD-10-CM | POA: Diagnosis not present

## 2018-10-05 ENCOUNTER — Telehealth: Payer: Self-pay

## 2018-10-05 DIAGNOSIS — G4733 Obstructive sleep apnea (adult) (pediatric): Secondary | ICD-10-CM

## 2018-10-05 NOTE — Telephone Encounter (Signed)
Dr. Ander Slade has reviewed the home sleep test this test was negative for sleep apnea.  Dr . Ander Slade recommendation are Patient has a significant clinical concern for sleep disordered breathing. AO suggests to conduct an in lab split night sleep study for patient The HST study was limited by flow sensor signal falling off for a major portion of the study Encourage to pt regular exercise and weight loss efforts Caution against driving when sleepy and against medications with sedative side effects  Called and spoke with patient regarding results.  Informed the patient of results and recommendations today. Pt verbalized understanding and denied any questions or concerns at this time.  Placed order for in lab sleep study today. Patient is unsure when he would like to do this study or if he would like to complete it or not. Pt advised he will think on having this completed, and will call next week. Nothing further needed.

## 2018-10-09 ENCOUNTER — Ambulatory Visit (INDEPENDENT_AMBULATORY_CARE_PROVIDER_SITE_OTHER): Payer: Medicare Other | Admitting: *Deleted

## 2018-10-09 DIAGNOSIS — Z5181 Encounter for therapeutic drug level monitoring: Secondary | ICD-10-CM

## 2018-10-09 DIAGNOSIS — I4891 Unspecified atrial fibrillation: Secondary | ICD-10-CM | POA: Diagnosis not present

## 2018-10-09 DIAGNOSIS — Z7901 Long term (current) use of anticoagulants: Secondary | ICD-10-CM

## 2018-10-09 LAB — POCT INR: INR: 2.7 (ref 2.0–3.0)

## 2018-10-09 NOTE — Patient Instructions (Signed)
Description   Continue taking  same dosage 1.5 tablets daily except 1 tablet on Sundays, Tuesdays, and Thursdays.  Recheck in 4 weeks. Call us with any medication changes or concerns # [phone] [facility] Main # [phone].

## 2018-11-05 ENCOUNTER — Ambulatory Visit (INDEPENDENT_AMBULATORY_CARE_PROVIDER_SITE_OTHER): Payer: Medicare Other | Admitting: Pharmacist

## 2018-11-05 DIAGNOSIS — Z5181 Encounter for therapeutic drug level monitoring: Secondary | ICD-10-CM | POA: Diagnosis not present

## 2018-11-05 DIAGNOSIS — Z7901 Long term (current) use of anticoagulants: Secondary | ICD-10-CM | POA: Diagnosis not present

## 2018-11-05 DIAGNOSIS — I4891 Unspecified atrial fibrillation: Secondary | ICD-10-CM | POA: Diagnosis not present

## 2018-11-05 LAB — POCT INR: INR: 2.9 (ref 2.0–3.0)

## 2018-11-05 NOTE — Patient Instructions (Signed)
Description   Continue taking same dosage 1.5 tablets daily except 1 tablet on Sundays, Tuesdays, and Thursdays.  Recheck in 6 weeks. Call us with any medication changes or concerns # [phone] [facility], Main # [phone].

## 2018-11-07 ENCOUNTER — Ambulatory Visit: Payer: Medicare Other | Admitting: Pulmonary Disease

## 2018-11-17 NOTE — Progress Notes (Signed)
[name] Date of Birth: Jun 19, 1941   History of Present Illness: [name] is seen today for follow up CAD and atrial fibrillation/flutter.  He has a history of coronary disease with prior coronary bypass surgery in 2005. Last evaluation with cardiac catheterization in July of 2012 showed a patent LIMA graft to the LAD. The second marginal branch was occluded and the first marginal branch had a moderately severe stenosis. This is treated medically. His last Myoview study here in November 2013 showed a small apical fixed defect without ischemia. Ejection fraction 69%. He does have a history of paroxysmal atrial fibrillation and is on chronic Coumadin therapy.  In February 2015 he had atrial flutter that resolved with IV Cardizem.  Echo in March 2015 showed a normal study except for mild diastolic dysfunction. In June 2016 he was visiting family in [place] when he developed severe HTN with BP increased to 218/118. He was admitted to the hospital in [facility], [place] and had extensive work up there. Myoview study showed a fixed inferior defect without ischemia and normal EF. Echo was normal.    He is now followed by pulmonary for COPD and sleep apnea. He did have a home sleep study in April showing sleep apnea. He is planning to have a formal sleep study next week.  He denies any chest pain, palpitations, dizziness. He does have chronic edema that goes down at night. Wears support hose. Gained weight in December.   Allergies as of 11/19/2018      Reactions   Cephalexin Rash      Medication List       Accurate as of November 19, 2018 11:23 AM. Always use your most recent med list.        acetaminophen 500 MG tablet Commonly known as:  TYLENOL Take 1,000 mg by mouth daily.   albuterol 108 (90 Base) MCG/ACT inhaler Commonly known as:  PROVENTIL HFA;VENTOLIN HFA Inhale 2 puffs into the lungs every 6 (six) hours as needed for wheezing or shortness of breath.   allopurinol 300 MG  tablet Commonly known as:  ZYLOPRIM TAKE 1 TABLET (300 MG TOTAL) BY MOUTH DAILY.   amLODipine 10 MG tablet Commonly known as:  NORVASC TAKE ONE TABLET BY MOUTH DAILY   atorvastatin 40 MG tablet Commonly known as:  LIPITOR Take 1 tablet (40 mg total) by mouth daily.   augmented betamethasone dipropionate 0.05 % cream Commonly known as:  DIPROLENE-AF Use as directed   b complex vitamins tablet Take 1 tablet by mouth daily.   cloNIDine 0.2 MG tablet Commonly known as:  CATAPRES Take 1 tablet (0.2 mg total) by mouth 2 (two) times daily.   fish oil-omega-3 fatty acids 1000 MG capsule Take 1 g by mouth 2 (two) times daily.   furosemide 20 MG tablet Commonly known as:  LASIX Take 1 tablet (20 mg total) by mouth daily.   GLIPIZIDE PO Take 5 mg by mouth daily.   GLUCOSAMINE CHOND MSM FORMULA PO Take 2 capsules by mouth daily.   isosorbide mononitrate 30 MG 24 hr tablet Commonly known as:  IMDUR TAKE 1 TABLET BY MOUTH DAILY   lisinopril 40 MG tablet Commonly known as:  PRINIVIL,ZESTRIL TAKE 1 TABLET BY MOUTH DAILY.   metFORMIN 500 MG 24 hr tablet Commonly known as:  GLUCOPHAGE-XR Take 2 tablets by mouth 2 (two) times daily. Take 2 tabs twice daily   metoprolol succinate 100 MG 24 hr tablet Commonly known as:  TOPROL-XL Take 1  tablet (100 mg total) by mouth daily. Take with or immediately following a meal.   multivitamin with minerals Tabs tablet Take 1 tablet by mouth daily.   nitroGLYCERIN 0.4 MG SL tablet Commonly known as:  Nitrostat PLACE 1 TABLET (0.4 MG TOTAL) UNDER THE TONGUE EVERY 5  MINUTES AS NEEDED FOR CHEST PAIN.   ONE TOUCH ULTRA TEST test strip Generic drug:  glucose blood   onetouch ultrasoft lancets   umeclidinium-vilanterol 62.5-25 MCG/INH Aepb Commonly known as:  Anoro Ellipta Inhale 1 puff into the lungs daily.   vitamin C 500 MG tablet Commonly known as:  ASCORBIC ACID Take 1,000 mg by mouth daily.   warfarin 5 MG tablet Commonly  known as:  COUMADIN Take as directed by the anticoagulation clinic. If you are unsure how to take this medication, talk to your nurse or doctor. Original instructions:  TAKE 1 TO 1 AND 1/2 TABLETS BY MOUTH DAILY AS DIRECTED BY COUMADIN CLINIC       Allergies  Allergen Reactions  . Cephalexin Rash    Past Medical History:  Diagnosis Date  . Chest pain July 2012   s/p repeat cath; stable anatomy. Managed medically  . Coronary artery disease   . Current use of long term anticoagulation   . Diabetes mellitus   . Dyslipidemia   . Edema   . Full dentures   . Gout   . History of acute myocardial infarction 11/2005   acute anteroseptal myocardial infarction  . Hyperlipidemia   . Hypertension   . Osteoarthritis   . Paroxysmal atrial fibrillation (HCC)   . Wears glasses     Past Surgical History:  Procedure Laterality Date  . CARDIAC CATHETERIZATION  05/18/2009  . CARDIAC CATHETERIZATION  July 2012   Stable anatomy. Manage medically  . COLONOSCOPY    . CORONARY ARTERY BYPASS GRAFT  2005  . DEBRIDEMENT AND CLOSURE WOUND Left 06/18/2014   Procedure: CLOSURE OF OPEN WOUND OF LEFT CHEEK/POSSIBLE CHEEK ADVANCEMENT/ROTATION;  Surgeon: Theodoro Kos, DO;  Location: [place];  Service: Plastics;  Laterality: Left;  . INCISION AND DRAINAGE OF WOUND Left 07/17/2014   Procedure: IRRIGATION AND DEBRIDEMENT LEFT CHEEK WOUND WITH PLACEMENT OF ACELL;  Surgeon: Theodoro Kos, DO;  Location: [place];  Service: Plastics;  Laterality: Left;  . TONSILLECTOMY      Social History   Tobacco Use  Smoking Status Former Smoker  . Types: Cigarettes  . Start date: 09/28/1962  . Last attempt to quit: 1989  . Years since quitting: 31.2  Smokeless Tobacco Never Used    Social History   Substance and Sexual Activity  Alcohol Use No    Family History  Problem Relation Age of Onset  . Coronary artery disease Mother   . Heart disease Mother   . Coronary artery  disease Father   . Heart disease Father   . Cancer Brother   . Cancer Brother     Review of Systems: The review of systems is as above.  All other systems were reviewed and are negative.  Physical Exam: BP (!) 147/69 (BP Location: Left Arm, Patient Position: Sitting, Cuff Size: Normal)   Pulse 66   Ht 5' 6.25" (1.683 m)   Wt 213 lb (96.6 kg)   SpO2 93%   BMI 34.12 kg/m  GENERAL:  Well appearing WM in NAD HEENT:  PERRL, EOMI, sclera are clear. Oropharynx is clear. NECK:  No jugular venous distention, carotid upstroke brisk and symmetric, no  bruits, no thyromegaly or adenopathy LUNGS:  Clear to auscultation bilaterally CHEST:  Unremarkable HEART:  RRR,  PMI not displaced or sustained,S1 and S2 within normal limits, no S3, no S4: no clicks, no rubs, no murmurs ABD:  Soft, nontender. BS +, no masses or bruits. No hepatomegaly, no splenomegaly EXT:  2 + pulses throughout, 1-2+ edema- support hose in place, no cyanosis no clubbing SKIN:  Warm and dry.  No rashes NEURO:  Alert and oriented x 3. Cranial nerves II through XII intact. PSYCH:  Cognitively intact  LABORATORY DATA:    Lab Results  Component Value Date   WBC 8.7 10/28/2014   HGB 14.9 10/28/2014   HCT 44.0 10/28/2014   PLT 220 10/28/2014   GLUCOSE 128 (H) 10/28/2014   CHOL 135 04/02/2012   TRIG 130.0 04/02/2012   HDL 41.30 04/02/2012   LDLCALC 68 04/02/2012   ALT 28 03/18/2011   AST 27 03/18/2011   NA 136 10/28/2014   K 3.7 10/28/2014   CL 102 10/28/2014   CREATININE 0.95 10/28/2014   BUN 16 10/28/2014   CO2 26 10/28/2014   TSH 0.882 03/19/2011   INR 2.9 11/05/2018   HGBA1C (H) 05/16/2009    6.3 (NOTE) The ADA recommends the following therapeutic goal for glycemic control related to Hgb A1c measurement: Goal of therapy: <6.5 Hgb A1c  Reference: American Diabetes Association: Clinical Practice Recommendations 2010, Diabetes Care, 2010, 33: (Suppl  1).     Labs dated 05/31/16: glucose 126. CMET normal. A1c  6.1%.  Dated 12/01/15: cholesterol 143, triglycerides 95, LDL 72, HDL 52. Dated 11/28/16: A1c 6.9%. Glucose 156, otherwise CMET normal. Cholesterol 142, triglycerides 207, HDL 40, LDL 61 Dated 06/01/17: A1c 7%. Glucose 141, cholesterol 120, triglycerides 151, HDL 41, LDL 49. CMET normal.  Dated 11/30/17: A1c 7.1%. Glucose 137. Other chemistries normal.  Dated 10/05/18: glucose 152, A1c 7.2%. otherwise CMET and CBC normal. Cholesterol 126, triglycerides 174, HDL 37, LDL 54.   Ecg today shows NSR rate 59. Normal Ecg. I have personally reviewed and interpreted this study.   Assessment / Plan:  1. Coronary disease status post CABG. Vein graft to the OM is known to be occluded. He is asymptomatic on medical therapy. Myoview June 2016 without ischemia and normal EF.  He will continue with his medical therapy.  2. Atrial fibrillation/flutter- paroxysmal. He is on chronic Coumadin therapy. He remains in sinus rhythm at this time. Continue current therapy  3. Hyperlipidemia. On statin therapy and fish oil. LDL 54 which is at goal < 70.   4. HTN.  Continue Rx  5. 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 Last A1c 7.2%. Encouraged  weight loss. If additional therapy is needed would recommend addition of an SGLT 2 inhibitor given CV benefits.   6. Edema. Chronic.  Continue lasix, elevation, support hose, and sodium restriction  8. OSA- followed by pulmonary   9. 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  I will follow up in 6 months.

## 2018-11-19 ENCOUNTER — Encounter: Payer: Self-pay | Admitting: Cardiology

## 2018-11-19 ENCOUNTER — Ambulatory Visit (INDEPENDENT_AMBULATORY_CARE_PROVIDER_SITE_OTHER): Payer: Medicare Other | Admitting: Cardiology

## 2018-11-19 ENCOUNTER — Other Ambulatory Visit: Payer: Self-pay

## 2018-11-19 VITALS — BP 147/69 | HR 66 | Ht 66.25 in | Wt 213.0 lb

## 2018-11-19 DIAGNOSIS — I480 Paroxysmal atrial fibrillation: Secondary | ICD-10-CM | POA: Diagnosis not present

## 2018-11-19 DIAGNOSIS — I10 Essential (primary) hypertension: Secondary | ICD-10-CM

## 2018-11-19 DIAGNOSIS — G4730 Sleep apnea, unspecified: Secondary | ICD-10-CM

## 2018-11-19 DIAGNOSIS — I2510 Atherosclerotic heart disease of native coronary artery without angina pectoris: Secondary | ICD-10-CM

## 2018-11-20 ENCOUNTER — Ambulatory Visit (HOSPITAL_BASED_OUTPATIENT_CLINIC_OR_DEPARTMENT_OTHER): Payer: Medicare Other | Attending: Pulmonary Disease | Admitting: Pulmonary Disease

## 2018-11-20 VITALS — Ht 69.0 in | Wt 205.0 lb

## 2018-11-20 DIAGNOSIS — G4733 Obstructive sleep apnea (adult) (pediatric): Secondary | ICD-10-CM | POA: Diagnosis present

## 2018-11-21 ENCOUNTER — Other Ambulatory Visit: Payer: Self-pay | Admitting: Cardiology

## 2018-11-21 DIAGNOSIS — Z7901 Long term (current) use of anticoagulants: Secondary | ICD-10-CM

## 2018-11-21 DIAGNOSIS — E785 Hyperlipidemia, unspecified: Secondary | ICD-10-CM

## 2018-11-21 DIAGNOSIS — I480 Paroxysmal atrial fibrillation: Secondary | ICD-10-CM

## 2018-11-21 DIAGNOSIS — I2510 Atherosclerotic heart disease of native coronary artery without angina pectoris: Secondary | ICD-10-CM

## 2018-11-21 DIAGNOSIS — I10 Essential (primary) hypertension: Secondary | ICD-10-CM

## 2018-11-21 MED ORDER — ATORVASTATIN CALCIUM 40 MG PO TABS: 40.0000 mg | ORAL_TABLET | Freq: Every day | ORAL | 2 refills | Status: DC

## 2018-11-21 MED ORDER — FUROSEMIDE 20 MG PO TABS: 20.0000 mg | ORAL_TABLET | Freq: Every day | ORAL | 2 refills | Status: DC

## 2018-11-21 MED ORDER — CLONIDINE HCL 0.2 MG PO TABS: 0.2000 mg | ORAL_TABLET | Freq: Two times a day (BID) | ORAL | 2 refills | Status: DC

## 2018-11-21 MED ORDER — WARFARIN SODIUM 5 MG PO TABS: ORAL_TABLET | ORAL | 1 refills | Status: DC

## 2018-11-21 NOTE — Telephone Encounter (Signed)
°*  STAT* If patient is at the pharmacy, call can be transferred to refill team.   1. Which medications need to be refilled? (please list name of each medication and dose if known) Allopurinol, Metoprolol, Warfarin, Furosemide, Atorvastatin, Clonidine, Lisinopril, Amlodipine and Isosorbide  2. Which pharmacy/location (including street and city if local pharmacy) is medication to be sent to? [name] [phone] 3. Do they need a 30 day or 90 day supply? 90 days supply for each one- he wants to get them in case he will not be able to get out, because of the virus

## 2018-11-21 NOTE — Telephone Encounter (Signed)
Patient notified and voiced understanding.

## 2018-11-26 ENCOUNTER — Telehealth: Payer: Self-pay | Admitting: Pulmonary Disease

## 2018-11-26 DIAGNOSIS — G4733 Obstructive sleep apnea (adult) (pediatric): Secondary | ICD-10-CM

## 2018-11-26 NOTE — Telephone Encounter (Signed)
Sleep study result  Date of study: 3/17/ 2020  Impression Severe obstructive sleep apnea  Recommendation: Trial of Auto-CPAP therapy on 5 -18 cm H2O with a Small Wide size Resmed Nasal CPAP Mask AirFit N30i mask and heated humidification.  Follow-up as scheduled

## 2018-11-26 NOTE — Procedures (Signed)
POLYSOMNOGRAPHY  Last, First: [name], [name] MRN: [id number] Gender: Male Age (years): 14 Weight (lbs): 205 DOB: 11-11-1940 BMI: 30 Primary Care: No PCP Epworth Score: 7 Referring: Laurin Coder MD Technician: Carolin Coy Interpreting: Laurin Coder MD Study Type: Split Night CPAP Ordered Study Type: Split Night CPAP Study date: 11/20/2018 Location: [facility] CLINICAL INFORMATION [name] is a 78 year old Male and was referred to the sleep center for evaluation of G47.33 OSA: Adult and Pediatric (327.23). Indications include Diabetes, Fatigue, Hypertension, OSA, Snoring.   Most recent polysomnogram dated 12/07/2017 revealed an AHI of 12.2/h and RDI of 12.2/h. MEDICATIONS Patient self administered medications include: METFORMIN, WARFARIN, FISH OIL, CLONIDINE. Medications administered during study include No sleep medicine administered.  SLEEP STUDY TECHNIQUE The patient underwent an attended overnight level one polysomnography titration to assess the effects of CPAP therapy. The following variables were monitored: EEG (C4-A1, C3-A2, O1-A2, O2-A1), EOG, submental and leg EMG, ECG, oxyhemoglobin saturation by pulse oximetry, thoracic and abdominal respiratory effort belts, nasal/oral airflow by pressure sensor, body position sensor and snoring sensor. CPAP pressure was titrated to eliminate apneas, hypopneas and oxygen desaturation. Hypopneas were scored per AASM definition IB (4% desaturation)  The NPSG portion of the study ended at 1:52:54 AM . The CPAP titration was initiated at 1:59:09 AM AM with the CPAP portion of the study ending at 4:28:55 AM.  TECHNICIAN COMMENTS Comments added by Technician: PATIENT WAS ORDERED AS A SPLIT NIGHT STUDY. Patient had more than two awakenings to use the bathroom Comments added by Scorer: N/A SLEEP ARCHITECTURE The recording time for the entire night was 363.4 minutes. The diagnostic portion was initiated at 10:25:32 PM and  terminated at 1:52:54 AM. The time in bed was 207.4 minutes. EEG confirmed total sleep time was 126.0 minutes yielding a sleep efficiency of 60.8%%. Sleep onset after lights out was 16.3 minutes with a REM latency of N/A minutes. The patient spent 46.0%% of the night in stage N1 sleep, 54.0%% in stage N2 sleep, 0.0%% in stage N3 and 0% in REM. The Arousal Index was 51.4/hour.  The titration portion was initiated at 1:59:09 AM and terminated at 4:28:55 AM. The time in bed was 149.8 minutes. EEG confirmed total sleep time was 0.0 minutes yielding a sleep efficiency of 0.0%%. Sleep onset after CPAP initiation was N/A minutes with a REM latency of N/A minutes. The patient spent N/A% of the night in stage N1 sleep, N/A% in stage N2 sleep, N/A% in stage N3 and 0% in REM. The Arousal Index was N/A/hour. RESPIRATORY PARAMETERS During the diagnostic portion, there were a total of 126 respiratory disturbances recorded; 15 apneas ( 15 obstructive, 0 mixed, 0 central), 66 hypopneas and 45 RERAs. The apnea/hypopnea index 38.6 was events/hour and the RDI was 60.0 events/hour. The central sleep apnea index was 0.0 events/hour. The REM AHI was N/A /h and NREM AHI was 38.6/h. The REM RDI was 0.0 /h and NREM RDI was 60.0 /h. The supine AHI was 38.6/h, and the non supine AHI was 0/h; supine during 100.0%% of sleep. The supine RDI was 60.0/h, and the non supine RDI was N/A/h. Respiratory disturbances were associated with oxygen desaturation down to a nadir of 86.0 % during sleep. The mean oxygen saturation during the study was 91.8%. The cumulative time under 88% oxygen saturation was 1.1 minutes.  During the titration portion, the apnea/hypopnea index (AHI) was N/A events/hour and the RDI was 0.0 events/hour. The central sleep apnea index was events/hour. The most appropriate  setting of CPAP was IPAP/EPAP 5/5 cm H2O. At this setting, the sleep efficiency was % and the patient was supine for 0%. The AHI was N/A events per  hour(with N/A central events). Oxygen nadir was 86.0. LEG MOVEMENT DATA The periodic limb movement index was 0.0/hour with an associated arousal index of /hour. CARDIAC DATA The underlying cardiac rhythm was most consistent with sinus rhythm. Mean heart rate was 53.1 during diagnostic portion and N/A during titration portion of study. Additional rhythm abnormalities include PVCs.   IMPRESSIONS - Severe Obstructive Sleep apnea(OSA), titration was suboptimal-limited by insomnia. - Electrocardiographic data showed presence of PVCs. - No Significant Central Sleep Apnea (CSA) - Moderate Oxygen Desaturation - The patient snored with soft snoring volume. - EEG did not show alpha intrusion. - No significant periodic leg movements(PLMs) during sleep. No significant associated arousals. - Reduced sleep efficiency, normal primary sleep latency, no REM sleep latency and no slow wave latency.   DIAGNOSIS - Obstructive Sleep Apnea (327.23 [G47.33 ICD-10])   RECOMMENDATIONS - Trial of Auto-CPAP therapy on 5 -18 cm H2O with a Small Wide size Resmed Nasal CPAP Mask AirFit N30i mask and heated humidification. - Avoid alcohol, sedatives and other CNS depressants that may worsen sleep apnea and disrupt normal sleep architecture. - Sleep hygiene should be reviewed to assess factors that may improve sleep quality. - Weight management and regular exercise should be initiated or continued. - Return to Sleep Center for re-evaluation after 4 weeks of therapy  [Electronically signed] 11/26/2018 05:45 AM  Sherrilyn Rist MD NPI: [id number]

## 2018-11-27 NOTE — Telephone Encounter (Signed)
lmom to call back 

## 2018-11-29 NOTE — Telephone Encounter (Signed)
Patient is aware and order is placed nothing further is needed at this time.  

## 2018-12-14 ENCOUNTER — Telehealth: Payer: Self-pay

## 2018-12-14 NOTE — Telephone Encounter (Signed)

## 2018-12-17 ENCOUNTER — Other Ambulatory Visit: Payer: Self-pay

## 2018-12-17 ENCOUNTER — Ambulatory Visit (INDEPENDENT_AMBULATORY_CARE_PROVIDER_SITE_OTHER): Payer: Medicare Other | Admitting: Pharmacist

## 2018-12-17 DIAGNOSIS — Z5181 Encounter for therapeutic drug level monitoring: Secondary | ICD-10-CM | POA: Diagnosis not present

## 2018-12-17 DIAGNOSIS — I4891 Unspecified atrial fibrillation: Secondary | ICD-10-CM | POA: Diagnosis not present

## 2018-12-17 DIAGNOSIS — Z7901 Long term (current) use of anticoagulants: Secondary | ICD-10-CM

## 2018-12-17 LAB — POCT INR: INR: 3 (ref 2.0–3.0)

## 2018-12-26 ENCOUNTER — Ambulatory Visit: Payer: Medicare Other | Admitting: Pulmonary Disease

## 2019-02-05 ENCOUNTER — Telehealth: Payer: Self-pay

## 2019-02-05 NOTE — Telephone Encounter (Signed)
lmom for prescreen/move appt to 845am on that same day and made aware of in office

## 2019-02-18 ENCOUNTER — Telehealth: Payer: Self-pay

## 2019-02-18 NOTE — Telephone Encounter (Signed)
lmom for prescreen  

## 2019-02-25 ENCOUNTER — Other Ambulatory Visit: Payer: Self-pay

## 2019-02-25 ENCOUNTER — Ambulatory Visit (INDEPENDENT_AMBULATORY_CARE_PROVIDER_SITE_OTHER): Payer: Medicare Other

## 2019-02-25 DIAGNOSIS — Z7901 Long term (current) use of anticoagulants: Secondary | ICD-10-CM

## 2019-02-25 DIAGNOSIS — Z5181 Encounter for therapeutic drug level monitoring: Secondary | ICD-10-CM | POA: Diagnosis not present

## 2019-02-25 DIAGNOSIS — I4891 Unspecified atrial fibrillation: Secondary | ICD-10-CM

## 2019-02-25 LAB — POCT INR: INR: 2.2 (ref 2.0–3.0)

## 2019-02-25 NOTE — Patient Instructions (Signed)
Description   Continue taking same dosage 1.5 tablets daily except 1 tablet on Sundays, Tuesdays, and Thursdays.  Recheck in 8 weeks. Call us with any medication changes or concerns # [phone] [facility] # [phone].

## 2019-04-22 ENCOUNTER — Encounter (INDEPENDENT_AMBULATORY_CARE_PROVIDER_SITE_OTHER): Payer: Self-pay

## 2019-04-22 ENCOUNTER — Ambulatory Visit (INDEPENDENT_AMBULATORY_CARE_PROVIDER_SITE_OTHER): Payer: Medicare Other | Admitting: *Deleted

## 2019-04-22 ENCOUNTER — Other Ambulatory Visit: Payer: Self-pay

## 2019-04-22 DIAGNOSIS — Z5181 Encounter for therapeutic drug level monitoring: Secondary | ICD-10-CM | POA: Diagnosis not present

## 2019-04-22 DIAGNOSIS — Z7901 Long term (current) use of anticoagulants: Secondary | ICD-10-CM

## 2019-04-22 DIAGNOSIS — I4891 Unspecified atrial fibrillation: Secondary | ICD-10-CM | POA: Diagnosis not present

## 2019-04-22 LAB — POCT INR: INR: 2.8 (ref 2.0–3.0)

## 2019-04-22 NOTE — Patient Instructions (Signed)
Description   Continue taking same dosage 1.5 tablets daily except 1 tablet on Sundays, Tuesdays, and Thursdays.  Recheck in 8 weeks. Call us with any medication changes or concerns # [phone] [facility] # [phone].

## 2019-05-20 ENCOUNTER — Other Ambulatory Visit: Payer: Self-pay | Admitting: Cardiology

## 2019-05-28 ENCOUNTER — Other Ambulatory Visit: Payer: Self-pay | Admitting: Cardiology

## 2019-05-29 NOTE — Telephone Encounter (Signed)
This should be filled by his primary care  Peter Martinique MD, [facility]

## 2019-05-30 NOTE — Progress Notes (Signed)
[name] Date of Birth: 10-Feb-1941   History of Present Illness: Mr. Ericksen is seen today for follow up CAD and atrial fibrillation/flutter.  He has a history of coronary disease with prior coronary bypass surgery in 2005. Last evaluation with cardiac catheterization in July of 2012 showed a patent LIMA graft to the LAD. The second marginal branch was occluded and the first marginal branch had a moderately severe stenosis. This is treated medically. His last Myoview study here in November 2013 showed a small apical fixed defect without ischemia. Ejection fraction 69%. He does have a history of paroxysmal atrial fibrillation and is on chronic Coumadin therapy.  In February 2015 he had atrial flutter that resolved with IV Cardizem.  Echo in March 2015 showed a normal study except for mild diastolic dysfunction. In June 2016 he was visiting family in [place] when he developed severe HTN with BP increased to 218/118. He was admitted to the hospital in [facility], [place] and had extensive work up there. Myoview study showed a fixed inferior defect without ischemia and normal EF. Echo was normal.    He is now followed by pulmonary for COPD and sleep apnea. He is starting on CPAP.   He does note that his BP is elevated at bedtime. Feels woozy. He denies any chest pain, palpitations, dizziness. He does have chronic edema that is unchanged. Wears support hose.   Allergies as of 06/03/2019      Reactions   Cephalexin Rash      Medication List       Accurate as of June 03, 2019  4:20 PM. If you have any questions, ask your nurse or doctor.        acetaminophen 500 MG tablet Commonly known as: TYLENOL Take 1,000 mg by mouth daily.   albuterol 108 (90 Base) MCG/ACT inhaler Commonly known as: VENTOLIN HFA Inhale 2 puffs into the lungs every 6 (six) hours as needed for wheezing or shortness of breath.   allopurinol 300 MG tablet Commonly known as: ZYLOPRIM TAKE ONE TABLET BY MOUTH  DAILY   amLODipine 10 MG tablet Commonly known as: NORVASC TAKE ONE TABLET BY MOUTH DAILY   atorvastatin 40 MG tablet Commonly known as: LIPITOR Take 1 tablet (40 mg total) by mouth daily.   augmented betamethasone dipropionate 0.05 % cream Commonly known as: DIPROLENE-AF Use as directed   b complex vitamins tablet Take 1 tablet by mouth daily.   cloNIDine 0.2 MG tablet Commonly known as: CATAPRES Take 1 tablet (0.2 mg total) by mouth 2 (two) times daily.   fish oil-omega-3 fatty acids 1000 MG capsule Take 1 g by mouth 2 (two) times daily.   furosemide 20 MG tablet Commonly known as: LASIX Take 1 tablet (20 mg total) by mouth daily.   GLIPIZIDE PO Take 5 mg by mouth daily.   GLUCOSAMINE CHOND MSM FORMULA PO Take 2 capsules by mouth daily.   isosorbide mononitrate 30 MG 24 hr tablet Commonly known as: IMDUR TAKE 1 TABLET BY MOUTH DAILY   lisinopril 40 MG tablet Commonly known as: ZESTRIL TAKE 1 TABLET BY MOUTH DAILY.   metFORMIN 500 MG 24 hr tablet Commonly known as: GLUCOPHAGE-XR Take 2 tablets by mouth 2 (two) times daily. Take 2 tabs twice daily   metoprolol succinate 100 MG 24 hr tablet Commonly known as: TOPROL-XL Take 1 tablet (100 mg total) by mouth daily. Take with or immediately following a meal.   multivitamin with minerals Tabs tablet Take 1  tablet by mouth daily.   nitroGLYCERIN 0.4 MG SL tablet Commonly known as: Nitrostat PLACE 1 TABLET (0.4 MG TOTAL) UNDER THE TONGUE EVERY 5  MINUTES AS NEEDED FOR CHEST PAIN.   ONE TOUCH ULTRA TEST test strip Generic drug: glucose blood   onetouch ultrasoft lancets   umeclidinium-vilanterol 62.5-25 MCG/INH Aepb Commonly known as: Anoro Ellipta Inhale 1 puff into the lungs daily.   vitamin C 500 MG tablet Commonly known as: ASCORBIC ACID Take 1,000 mg by mouth daily.   warfarin 5 MG tablet Commonly known as: COUMADIN Take as directed by the anticoagulation clinic. If you are unsure how to take this  medication, talk to your nurse or doctor. Original instructions: TAKE 1 TO 1 AND 1/2 TABLETS BY MOUTH DAILY AS DIRECTED BY COUMADIN CLINIC       Allergies  Allergen Reactions  . Cephalexin Rash    Past Medical History:  Diagnosis Date  . Chest pain July 2012   s/p repeat cath; stable anatomy. Managed medically  . Coronary artery disease   . Current use of long term anticoagulation   . Diabetes mellitus   . Dyslipidemia   . Edema   . Full dentures   . Gout   . History of acute myocardial infarction 11/2005   acute anteroseptal myocardial infarction  . Hyperlipidemia   . Hypertension   . Osteoarthritis   . Paroxysmal atrial fibrillation (HCC)   . Wears glasses     Past Surgical History:  Procedure Laterality Date  . CARDIAC CATHETERIZATION  05/18/2009  . CARDIAC CATHETERIZATION  July 2012   Stable anatomy. Manage medically  . COLONOSCOPY    . CORONARY ARTERY BYPASS GRAFT  2005  . DEBRIDEMENT AND CLOSURE WOUND Left 06/18/2014   Procedure: CLOSURE OF OPEN WOUND OF LEFT CHEEK/POSSIBLE CHEEK ADVANCEMENT/ROTATION;  Surgeon: Theodoro Kos, DO;  Location: [place];  Service: Plastics;  Laterality: Left;  . INCISION AND DRAINAGE OF WOUND Left 07/17/2014   Procedure: IRRIGATION AND DEBRIDEMENT LEFT CHEEK WOUND WITH PLACEMENT OF ACELL;  Surgeon: Theodoro Kos, DO;  Location: [place];  Service: Plastics;  Laterality: Left;  . TONSILLECTOMY      Social History   Tobacco Use  Smoking Status Former Smoker  . Types: Cigarettes  . Start date: 09/28/1962  . Quit date: 46  . Years since quitting: 31.7  Smokeless Tobacco Never Used    Social History   Substance and Sexual Activity  Alcohol Use No    Family History  Problem Relation Age of Onset  . Coronary artery disease Mother   . Heart disease Mother   . Coronary artery disease Father   . Heart disease Father   . Cancer Brother   . Cancer Brother     Review of Systems: The  review of systems is as above.  All other systems were reviewed and are negative.  Physical Exam: BP (!) 158/74   Pulse 66   Temp (!) 97 F (36.1 C)   Ht 5\' 9"  (1.753 m)   Wt 210 lb (95.3 kg)   SpO2 95%   BMI 31.01 kg/m  GENERAL:  Well appearing WM in NAD HEENT:  PERRL, EOMI, sclera are clear. Oropharynx is clear. NECK:  No jugular venous distention, carotid upstroke brisk and symmetric, no bruits, no thyromegaly or adenopathy LUNGS:  Clear to auscultation bilaterally CHEST:  Unremarkable HEART:  RRR,  PMI not displaced or sustained,S1 and S2 within normal limits, no S3, no S4: no  clicks, no rubs, no murmurs ABD:  Soft, nontender. BS +, no masses or bruits. No hepatomegaly, no splenomegaly EXT:  2 + pulses throughout, 1+ edema- support hose in place, no cyanosis no clubbing SKIN:  Warm and dry.  No rashes NEURO:  Alert and oriented x 3. Cranial nerves II through XII intact. PSYCH:  Cognitively intact  LABORATORY DATA:    Lab Results  Component Value Date   WBC 8.7 10/28/2014   HGB 14.9 10/28/2014   HCT 44.0 10/28/2014   PLT 220 10/28/2014   GLUCOSE 128 (H) 10/28/2014   CHOL 135 04/02/2012   TRIG 130.0 04/02/2012   HDL 41.30 04/02/2012   LDLCALC 68 04/02/2012   ALT 28 03/18/2011   AST 27 03/18/2011   NA 136 10/28/2014   K 3.7 10/28/2014   CL 102 10/28/2014   CREATININE 0.95 10/28/2014   BUN 16 10/28/2014   CO2 26 10/28/2014   TSH 0.882 03/19/2011   INR 2.8 04/22/2019   HGBA1C (H) 05/16/2009    6.3 (NOTE) The ADA recommends the following therapeutic goal for glycemic control related to Hgb A1c measurement: Goal of therapy: <6.5 Hgb A1c  Reference: American Diabetes Association: Clinical Practice Recommendations 2010, Diabetes Care, 2010, 33: (Suppl  1).     Labs dated 05/31/16: glucose 126. CMET normal. A1c 6.1%.  Dated 12/01/15: cholesterol 143, triglycerides 95, LDL 72, HDL 52. Dated 11/28/16: A1c 6.9%. Glucose 156, otherwise CMET normal. Cholesterol 142,  triglycerides 207, HDL 40, LDL 61 Dated 06/01/17: A1c 7%. Glucose 141, cholesterol 120, triglycerides 151, HDL 41, LDL 49. CMET normal.  Dated 11/30/17: A1c 7.1%. Glucose 137. Other chemistries normal.  Dated 10/05/18: glucose 152, A1c 7.2%. otherwise CMET and CBC normal. Cholesterol 126, triglycerides 174, HDL 37, LDL 54.  Dated 04/05/19: glucose 204, A1c  7.3%. otherwise CMET normal   Assessment / Plan:  1. Coronary disease status post CABG. Vein graft to the OM is known to be occluded. He is asymptomatic on medical therapy. Myoview June 2016 without ischemia and normal EF.  He will continue with his medical therapy.  2. Atrial fibrillation/flutter- paroxysmal. He is on chronic Coumadin therapy. Appears to be in sinus rhythm at this time. Continue current therapy  3. Hyperlipidemia. On statin therapy and fish oil. Excellent control  4. HTN.  We discussed changing amlodipine to pm with his evening clonidine to try and smooth out his evening BP. He is on multiple antihypertensive agents. Hopefully starting on CPAP will help. Needs to restrict salt and exercise.   5. 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 Last A1c 7.3%.   6. Edema. Chronic.  Continue lasix, elevation, support hose, and sodium restriction  8. OSA- followed by pulmonary   9. 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  I will follow up in 6 months.

## 2019-06-03 ENCOUNTER — Other Ambulatory Visit: Payer: Self-pay

## 2019-06-03 ENCOUNTER — Ambulatory Visit (INDEPENDENT_AMBULATORY_CARE_PROVIDER_SITE_OTHER): Payer: Medicare Other | Admitting: Cardiology

## 2019-06-03 ENCOUNTER — Other Ambulatory Visit: Payer: Self-pay | Admitting: Cardiology

## 2019-06-03 ENCOUNTER — Encounter: Payer: Self-pay | Admitting: Cardiology

## 2019-06-03 VITALS — BP 158/74 | HR 66 | Temp 97.0°F | Ht 69.0 in | Wt 210.0 lb

## 2019-06-03 DIAGNOSIS — I480 Paroxysmal atrial fibrillation: Secondary | ICD-10-CM

## 2019-06-03 DIAGNOSIS — I2510 Atherosclerotic heart disease of native coronary artery without angina pectoris: Secondary | ICD-10-CM

## 2019-06-03 DIAGNOSIS — Z7901 Long term (current) use of anticoagulants: Secondary | ICD-10-CM

## 2019-06-03 DIAGNOSIS — I10 Essential (primary) hypertension: Secondary | ICD-10-CM

## 2019-06-03 DIAGNOSIS — E785 Hyperlipidemia, unspecified: Secondary | ICD-10-CM

## 2019-06-03 MED ORDER — NITROGLYCERIN 0.4 MG SL SUBL: SUBLINGUAL_TABLET | SUBLINGUAL | 1 refills | Status: DC

## 2019-06-03 NOTE — Patient Instructions (Signed)
Take the amlodipine in the evening with your evening clonidine  Continue your other therapy

## 2019-06-17 ENCOUNTER — Other Ambulatory Visit: Payer: Self-pay

## 2019-06-17 ENCOUNTER — Ambulatory Visit (INDEPENDENT_AMBULATORY_CARE_PROVIDER_SITE_OTHER): Payer: Medicare Other | Admitting: *Deleted

## 2019-06-17 ENCOUNTER — Encounter (INDEPENDENT_AMBULATORY_CARE_PROVIDER_SITE_OTHER): Payer: Self-pay

## 2019-06-17 DIAGNOSIS — Z7901 Long term (current) use of anticoagulants: Secondary | ICD-10-CM | POA: Diagnosis not present

## 2019-06-17 DIAGNOSIS — Z5181 Encounter for therapeutic drug level monitoring: Secondary | ICD-10-CM | POA: Diagnosis not present

## 2019-06-17 DIAGNOSIS — I480 Paroxysmal atrial fibrillation: Secondary | ICD-10-CM

## 2019-06-17 DIAGNOSIS — I4891 Unspecified atrial fibrillation: Secondary | ICD-10-CM

## 2019-06-17 LAB — POCT INR: INR: 2.8 (ref 2.0–3.0)

## 2019-06-17 NOTE — Patient Instructions (Signed)
Description   Continue taking same dosage 1.5 tablets daily except 1 tablet on Sundays, Tuesdays, and Thursdays.  Recheck in 8 weeks. Call us with any medication changes or concerns # [phone] [facility] Main # [phone].      

## 2019-07-15 ENCOUNTER — Other Ambulatory Visit: Payer: Self-pay

## 2019-07-15 ENCOUNTER — Encounter: Payer: Self-pay | Admitting: Pulmonary Disease

## 2019-07-15 ENCOUNTER — Ambulatory Visit (INDEPENDENT_AMBULATORY_CARE_PROVIDER_SITE_OTHER): Payer: Medicare Other | Admitting: Pulmonary Disease

## 2019-07-15 VITALS — BP 130/62 | HR 64 | Ht 69.0 in | Wt 209.4 lb

## 2019-07-15 DIAGNOSIS — G4733 Obstructive sleep apnea (adult) (pediatric): Secondary | ICD-10-CM

## 2019-07-15 NOTE — Progress Notes (Signed)
[name]    HC:[id number]    [birth year]  Primary Care Physician:Howell, Bryn Gulling, MD  Referring Physician: Helane Rima, MD [place] [address] [place],  [place] [id number]  Chief complaint:   Shortness of breath Abnormal breathing when he is sleeping at night noted by family members-witnessed apneas noted  HPI: Symptoms have been relatively stable with no new symptoms Breathing feels relatively stable Gets short of breath with walking uphill or walking up steps  He has been off Anoro Albuterol use only once or twice a week Given the level remains good  Mild obstructive sleep apnea Abnormal PFT showing moderate obstructive lung disease  Patient with a history of shortness of breath  Abnormal breathing at night He had a recent sleep study which revealed mild obstructive sleep apnea  Significant smoking history-to 3 packs a day 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 He is very active usually He gets short of breath walking up a flight of steps Gets short of breath with significant exertion  Denies a chronic cough Has no fevers or chills   Occupation: Trucking Smoking history: Reformed smoker  Outpatient Encounter Medications as of 07/15/2019  Medication Sig   acetaminophen (TYLENOL) 500 MG tablet Take 1,000 mg by mouth daily.    albuterol (PROVENTIL HFA;VENTOLIN HFA) 108 (90 Base) MCG/ACT inhaler Inhale 2 puffs into the lungs every 6 (six) hours as needed for wheezing or shortness of breath.   allopurinol (ZYLOPRIM) 300 MG tablet TAKE ONE TABLET BY MOUTH DAILY   amLODipine (NORVASC) 10 MG tablet TAKE ONE TABLET BY MOUTH DAILY   Ascorbic Acid (VITAMIN C) 500 MG tablet Take 1,000 mg by mouth daily.    atorvastatin (LIPITOR) 40 MG tablet Take 1 tablet  (40 mg total) by mouth daily.   augmented betamethasone dipropionate (DIPROLENE-AF) 0.05 % cream Use as directed   b complex vitamins tablet Take 1 tablet by mouth daily.    cloNIDine (CATAPRES) 0.2 MG tablet Take 1 tablet (0.2 mg total) by mouth 2 (two) times daily.   fish oil-omega-3 fatty acids 1000 MG capsule Take 1 g by mouth 2 (two) times daily.    furosemide (LASIX) 20 MG tablet Take 1 tablet (20 mg total) by mouth daily.   GLIPIZIDE PO Take 5 mg by mouth daily.    isosorbide mononitrate (IMDUR) 30 MG 24 hr tablet TAKE 1 TABLET BY MOUTH DAILY   Lancets (ONETOUCH ULTRASOFT) lancets    lisinopril (ZESTRIL) 40 MG tablet TAKE 1 TABLET BY MOUTH DAILY.   metFORMIN (GLUCOPHAGE-XR) 500 MG 24 hr tablet Take 2 tablets by mouth 2 (two) times daily. Take 2 tabs twice daily   metoprolol succinate (TOPROL-XL) 100 MG 24 hr tablet Take 1 tablet (100 mg total) by mouth daily. Take with or immediately following a meal.   Misc Natural Products (GLUCOSAMINE CHOND MSM FORMULA PO) Take 2 capsules by mouth daily.   Multiple Vitamin (MULTIVITAMIN WITH MINERALS) TABS tablet Take 1 tablet by mouth daily.   nitroGLYCERIN (NITROSTAT) 0.4 MG SL tablet PLACE 1 TABLET (0.4 MG TOTAL) UNDER THE TONGUE EVERY 5  MINUTES AS NEEDED FOR CHEST PAIN.   ONE TOUCH ULTRA TEST test strip    warfarin (COUMADIN)  5 MG tablet TAKE 1 TO 1 AND 1/2 TABLETS BY MOUTH DAILY AS DIRECTED BY COUMADIN CLINIC   umeclidinium-vilanterol (ANORO ELLIPTA) 62.5-25 MCG/INH AEPB Inhale 1 puff into the lungs daily. (Patient not taking: Reported on 07/15/2019)   No facility-administered encounter medications on file as of 07/15/2019.     Allergies as of 07/15/2019 - Review Complete 07/15/2019  Allergen Reaction Noted   Cephalexin Rash 12/20/2010    Past Medical History:  Diagnosis Date   Chest pain July 2012   s/p repeat cath; stable anatomy. Managed medically   Coronary artery disease    Current use of long term  anticoagulation    Diabetes mellitus    Dyslipidemia    Edema    Full dentures    Gout    History of acute myocardial infarction 11/2005   acute anteroseptal myocardial infarction   Hyperlipidemia    Hypertension    Osteoarthritis    Paroxysmal atrial fibrillation [facility])    Wears glasses     Past Surgical History:  Procedure Laterality Date   CARDIAC CATHETERIZATION  05/18/2009   CARDIAC CATHETERIZATION  July 2012   Stable anatomy. Manage medically   COLONOSCOPY     CORONARY ARTERY BYPASS GRAFT  2005   DEBRIDEMENT AND CLOSURE WOUND Left 06/18/2014   Procedure: CLOSURE OF OPEN WOUND OF LEFT CHEEK/POSSIBLE CHEEK ADVANCEMENT/ROTATION;  Surgeon: Theodoro Kos, DO;  Location: [place];  Service: Plastics;  Laterality: Left;   INCISION AND DRAINAGE OF WOUND Left 07/17/2014   Procedure: IRRIGATION AND DEBRIDEMENT LEFT CHEEK WOUND WITH PLACEMENT OF ACELL;  Surgeon: Theodoro Kos, DO;  Location: [place];  Service: Plastics;  Laterality: Left;   TONSILLECTOMY      Family History  Problem Relation Age of Onset   Coronary artery disease Mother    Heart disease Mother    Coronary artery disease Father    Heart disease Father    Cancer Brother    Cancer Brother     Social History   Socioeconomic History   Marital status: Single    Spouse name: Not on file   Number of children: Not on file   Years of education: Not on file   Highest education level: Not on file  Occupational History   Not on file  Social Needs   Financial resource strain: Not on file   Food insecurity    Worry: Not on file    Inability: Not on file   Transportation needs    Medical: Not on file    Non-medical: Not on file  Tobacco Use   Smoking status: Former Smoker    Types: Cigarettes    Start date: 09/28/1962    Quit date: 1989    Years since quitting: 31.8   Smokeless tobacco: Never Used  Substance and Sexual Activity   Alcohol  use: No   Drug use: No   Sexual activity: Never  Lifestyle   Physical activity    Days per week: Not on file    Minutes per session: Not on file   Stress: Not on file  Relationships   Social connections    Talks on phone: Not on file    Gets together: Not on file    Attends religious service: Not on file    Active member of club or organization: Not on file    Attends meetings of clubs or organizations: Not on file    Relationship status: Not on file   Intimate partner  violence    Fear of current or ex partner: Not on file    Emotionally abused: Not on file    Physically abused: Not on file    Forced sexual activity: Not on file  Other Topics Concern   Not on file  Social History Narrative   Not on file    Review of Systems  Constitutional: Negative for fatigue.  HENT: Negative.   Eyes: Negative.   Respiratory: Positive for shortness of breath. Negative for apnea, cough and wheezing.   Cardiovascular: Negative.  Negative for chest pain and leg swelling.  Gastrointestinal: Negative.   Endocrine: Negative.   Musculoskeletal: Negative.   Skin: Negative.   Psychiatric/Behavioral: Positive for sleep disturbance.  All other systems reviewed and are negative.  Vitals:   07/15/19 1103  BP: 130/62  Pulse: 64  SpO2: 95%   Physical Exam  Constitutional: He appears well-developed and well-nourished. No distress.  Overweight  HENT:  Head: Normocephalic and atraumatic.  Eyes: Pupils are equal, round, and reactive to light. Conjunctivae and EOM are normal. Right eye exhibits no discharge. Left eye exhibits no discharge.  Neck: Normal range of motion. Neck supple. No thyromegaly present.  Cardiovascular: Normal rate, regular rhythm and normal heart sounds.  Pulmonary/Chest: Effort normal and breath sounds normal. No respiratory distress. He has no wheezes. He has no rales. He exhibits no tenderness.  Reduced air movement bilaterally  Abdominal: Soft. Bowel sounds are  normal. He exhibits no distension. There is no abdominal tenderness.  Skin: He is not diaphoretic.  Psychiatric: He has a normal mood and affect. His behavior is normal.   Data Reviewed: Recent sleep study reviewed showing mild obstructive sleep apnea with mild oxygen desaturations  Echocardiogram from 2015 revealed ejection fraction of 60 to 123456, diastolic dysfunction  Pulmonary function study does reveal moderate obstructive disease Results of the Epworth flowsheet 07/15/2019 09/12/2018  Sitting and reading 2 0  Watching TV 2 2  Sitting, inactive in a public place (e.g. 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  Continue albuterol as needed .  Leave himself off Anoro at present .  Auto titrating CPAP will be appropriate but does not want to initiate any treatment until resolution of Covid issues .  Courage to call us if he decides to go ahead with CPAP  .  I will see him back in about 3 months  Sherrilyn Rist MD [facility] 07/15/2019, 11:06 AM  CC: Helane Rima, MD

## 2019-07-15 NOTE — Patient Instructions (Signed)
History of obstructive sleep apnea  Obstructive lung disease  As your symptoms have been controlled off Anoro, we will leave Anoro off completely  I will see you back in the office in about 3 months  Give [place] a call to have you set up for CPAP once you are ready to initiate CPAP

## 2019-08-12 ENCOUNTER — Other Ambulatory Visit: Payer: Self-pay

## 2019-08-12 ENCOUNTER — Ambulatory Visit (INDEPENDENT_AMBULATORY_CARE_PROVIDER_SITE_OTHER): Payer: Medicare Other | Admitting: *Deleted

## 2019-08-12 DIAGNOSIS — Z5181 Encounter for therapeutic drug level monitoring: Secondary | ICD-10-CM | POA: Diagnosis not present

## 2019-08-12 DIAGNOSIS — I480 Paroxysmal atrial fibrillation: Secondary | ICD-10-CM | POA: Diagnosis not present

## 2019-08-12 DIAGNOSIS — I4891 Unspecified atrial fibrillation: Secondary | ICD-10-CM

## 2019-08-12 DIAGNOSIS — Z7901 Long term (current) use of anticoagulants: Secondary | ICD-10-CM | POA: Diagnosis not present

## 2019-08-12 LAB — POCT INR: INR: 3.6 — AB (ref 2.0–3.0)

## 2019-08-12 NOTE — Patient Instructions (Addendum)
Description   Hold today, then continue taking same dosage 1.5 tablets daily except 1 tablet on Sundays, Tuesdays, and Thursdays.  Recheck in 3 weeks. Call us with any medication changes or concerns # [phone] [facility] Main # [phone].

## 2019-08-17 ENCOUNTER — Other Ambulatory Visit: Payer: Self-pay | Admitting: Cardiology

## 2019-08-18 ENCOUNTER — Other Ambulatory Visit: Payer: Self-pay | Admitting: Cardiology

## 2019-08-27 ENCOUNTER — Other Ambulatory Visit: Payer: Self-pay | Admitting: Cardiology

## 2019-08-27 DIAGNOSIS — I10 Essential (primary) hypertension: Secondary | ICD-10-CM

## 2019-08-27 DIAGNOSIS — Z7901 Long term (current) use of anticoagulants: Secondary | ICD-10-CM

## 2019-08-27 DIAGNOSIS — I480 Paroxysmal atrial fibrillation: Secondary | ICD-10-CM

## 2019-08-27 DIAGNOSIS — E785 Hyperlipidemia, unspecified: Secondary | ICD-10-CM

## 2019-08-27 DIAGNOSIS — I2510 Atherosclerotic heart disease of native coronary artery without angina pectoris: Secondary | ICD-10-CM

## 2019-09-02 ENCOUNTER — Other Ambulatory Visit: Payer: Self-pay

## 2019-09-02 ENCOUNTER — Ambulatory Visit (INDEPENDENT_AMBULATORY_CARE_PROVIDER_SITE_OTHER): Payer: Medicare Other | Admitting: Pharmacist

## 2019-09-02 DIAGNOSIS — Z7901 Long term (current) use of anticoagulants: Secondary | ICD-10-CM

## 2019-09-02 DIAGNOSIS — Z5181 Encounter for therapeutic drug level monitoring: Secondary | ICD-10-CM

## 2019-09-02 DIAGNOSIS — I4891 Unspecified atrial fibrillation: Secondary | ICD-10-CM | POA: Diagnosis not present

## 2019-09-02 LAB — POCT INR: INR: 3.1 — AB (ref 2.0–3.0)

## 2019-09-02 NOTE — Patient Instructions (Signed)
Take 1 tablet today then start taking 1 tablet daily except 1.5 tablets on Monday, Wedensday and Friday.  Recheck in 3 weeks. Call us with any medication changes or concerns # [phone] [facility] Main # [phone].

## 2019-09-23 ENCOUNTER — Ambulatory Visit (INDEPENDENT_AMBULATORY_CARE_PROVIDER_SITE_OTHER): Payer: Medicare Other | Admitting: Pharmacist

## 2019-09-23 ENCOUNTER — Other Ambulatory Visit: Payer: Self-pay

## 2019-09-23 DIAGNOSIS — Z7901 Long term (current) use of anticoagulants: Secondary | ICD-10-CM | POA: Diagnosis not present

## 2019-09-23 DIAGNOSIS — Z5181 Encounter for therapeutic drug level monitoring: Secondary | ICD-10-CM

## 2019-09-23 DIAGNOSIS — I4891 Unspecified atrial fibrillation: Secondary | ICD-10-CM | POA: Diagnosis not present

## 2019-09-23 LAB — POCT INR: INR: 1.9 — AB (ref 2.0–3.0)

## 2019-09-23 NOTE — Patient Instructions (Signed)
Description   Take 2  tablets today (2 of the 5 mg tablets for a total of 10 mg) then continue taking 1 tablet daily except 1.5 tablets on Monday, Wedensday and Friday.  Recheck in 3 weeks. Call us with any medication changes or concerns # [phone] [facility] Main # [phone].

## 2019-10-02 ENCOUNTER — Other Ambulatory Visit: Payer: Self-pay | Admitting: Pulmonary Disease

## 2019-10-14 ENCOUNTER — Other Ambulatory Visit: Payer: Self-pay

## 2019-10-14 ENCOUNTER — Ambulatory Visit (INDEPENDENT_AMBULATORY_CARE_PROVIDER_SITE_OTHER): Payer: Medicare Other | Admitting: *Deleted

## 2019-10-14 DIAGNOSIS — Z7901 Long term (current) use of anticoagulants: Secondary | ICD-10-CM | POA: Diagnosis not present

## 2019-10-14 DIAGNOSIS — I4891 Unspecified atrial fibrillation: Secondary | ICD-10-CM | POA: Diagnosis not present

## 2019-10-14 DIAGNOSIS — Z5181 Encounter for therapeutic drug level monitoring: Secondary | ICD-10-CM

## 2019-10-14 LAB — POCT INR: INR: 2 (ref 2.0–3.0)

## 2019-10-14 NOTE — Patient Instructions (Signed)
Description    Start taking 1.5 tablets daily except 1 tablet on Sunday, Tuesday and Thursdays.  Recheck in 3 weeks. Call us with any medication changes or concerns # [phone] [facility] Main # [phone].

## 2019-11-04 ENCOUNTER — Other Ambulatory Visit: Payer: Self-pay

## 2019-11-04 ENCOUNTER — Ambulatory Visit (INDEPENDENT_AMBULATORY_CARE_PROVIDER_SITE_OTHER): Payer: Medicare Other

## 2019-11-04 ENCOUNTER — Other Ambulatory Visit: Payer: Self-pay | Admitting: Cardiology

## 2019-11-04 DIAGNOSIS — I4891 Unspecified atrial fibrillation: Secondary | ICD-10-CM

## 2019-11-04 DIAGNOSIS — I480 Paroxysmal atrial fibrillation: Secondary | ICD-10-CM

## 2019-11-04 DIAGNOSIS — Z7901 Long term (current) use of anticoagulants: Secondary | ICD-10-CM | POA: Diagnosis not present

## 2019-11-04 DIAGNOSIS — E785 Hyperlipidemia, unspecified: Secondary | ICD-10-CM

## 2019-11-04 DIAGNOSIS — Z5181 Encounter for therapeutic drug level monitoring: Secondary | ICD-10-CM | POA: Diagnosis not present

## 2019-11-04 DIAGNOSIS — I10 Essential (primary) hypertension: Secondary | ICD-10-CM

## 2019-11-04 DIAGNOSIS — I2510 Atherosclerotic heart disease of native coronary artery without angina pectoris: Secondary | ICD-10-CM

## 2019-11-04 LAB — POCT INR: INR: 2.4 (ref 2.0–3.0)

## 2019-11-04 NOTE — Patient Instructions (Signed)
Description   Continue on same dosage 1.5 tablets daily except 1 tablet on Sundays, Tuesdays and Thursdays.  Recheck in 4 weeks. Call us with any medication changes or concerns # [phone] [facility], Main # [phone].

## 2019-11-16 ENCOUNTER — Other Ambulatory Visit: Payer: Self-pay | Admitting: Cardiology

## 2019-11-23 ENCOUNTER — Other Ambulatory Visit: Payer: Self-pay | Admitting: Cardiology

## 2019-11-30 NOTE — Progress Notes (Signed)
[name] Date of Birth: 03-04-1941   History of Present Illness: [name] is seen today for follow up CAD and atrial fibrillation/flutter.  He has a history of coronary disease with prior coronary bypass surgery in 2005. Last evaluation with cardiac catheterization in July of 2012 showed a patent LIMA graft to the LAD. The second marginal branch was occluded and the first marginal branch had a moderately severe stenosis. This is treated medically. His last Myoview study here in November 2013 showed a small apical fixed defect without ischemia. Ejection fraction 69%. He does have a history of paroxysmal atrial fibrillation and is on chronic Coumadin therapy.  In February 2015 he had atrial flutter that resolved with IV Cardizem.  Echo in March 2015 showed a normal study except for mild diastolic dysfunction. In June 2016 he was visiting family in [place] when he developed severe HTN with BP increased to 218/118. He was admitted to the hospital in [facility], [place] and had extensive work up there. Myoview study showed a fixed inferior defect without ischemia and normal EF. Echo was normal.    He is now followed by pulmonary for COPD and sleep apnea. He has not required CPAP yet.   On follow up today he is doing well. He still notes some swelling in his ankles. No chest pain or dyspnea. Uses albuterol occasionally. No palpitations. Has lost 4 lbs and states sugars have improved.   Allergies as of 12/05/2019      Reactions   Cephalexin Rash      Medication List       Accurate as of December 05, 2019  8:05 AM. If you have any questions, ask your nurse or doctor.        STOP taking these medications   umeclidinium-vilanterol 62.5-25 MCG/INH Aepb Commonly known as: Anoro Ellipta Stopped by: Dawnell Bryant [place], MD     TAKE these medications   acetaminophen 500 MG tablet Commonly known as: TYLENOL Take 1,000 mg by mouth daily.   albuterol 108 (90 Base) MCG/ACT inhaler Commonly known as:  VENTOLIN HFA INHALE TWO PUFFS INTO THE LUNGS EVERY 6 HOURS AS NEEDED FOR WHEEZING OR SHORTNESS OF BREATH   allopurinol 300 MG tablet Commonly known as: ZYLOPRIM TAKE ONE TABLET BY MOUTH DAILY   amLODipine 10 MG tablet Commonly known as: NORVASC TAKE ONE TABLET BY MOUTH DAILY   atorvastatin 40 MG tablet Commonly known as: LIPITOR Take 1 tablet (40 mg total) by mouth daily.   augmented betamethasone dipropionate 0.05 % cream Commonly known as: DIPROLENE-AF Use as directed   b complex vitamins tablet Take 1 tablet by mouth daily.   cloNIDine 0.2 MG tablet Commonly known as: CATAPRES TAKE ONE TABLET BY MOUTH TWICE A DAY   fish oil-omega-3 fatty acids 1000 MG capsule Take 1 g by mouth 2 (two) times daily.   furosemide 20 MG tablet Commonly known as: LASIX TAKE ONE TABLET BY MOUTH DAILY   GLIPIZIDE PO Take 5 mg by mouth daily.   GLUCOSAMINE CHOND MSM FORMULA PO Take 2 capsules by mouth daily.   isosorbide mononitrate 30 MG 24 hr tablet Commonly known as: IMDUR TAKE 1 TABLET BY MOUTH DAILY   lisinopril 40 MG tablet Commonly known as: ZESTRIL TAKE 1 TABLET BY MOUTH DAILY.   metFORMIN 500 MG 24 hr tablet Commonly known as: GLUCOPHAGE-XR Take 2 tablets by mouth 2 (two) times daily. Take 2 tabs twice daily   metoprolol succinate 100 MG 24 hr tablet Commonly known  as: TOPROL-XL TAKE ONE TABLET BY MOUTH DAILY WITH OR IMMEDIATEDLY FOLLOWING A MEAL   multivitamin with minerals Tabs tablet Take 1 tablet by mouth daily.   nitroGLYCERIN 0.4 MG SL tablet Commonly known as: Nitrostat PLACE 1 TABLET (0.4 MG TOTAL) UNDER THE TONGUE EVERY 5  MINUTES AS NEEDED FOR CHEST PAIN.   ONE TOUCH ULTRA TEST test strip Generic drug: glucose blood   onetouch ultrasoft lancets   vitamin C 500 MG tablet Commonly known as: ASCORBIC ACID Take 1,000 mg by mouth daily.   warfarin 5 MG tablet Commonly known as: COUMADIN Take as directed by the anticoagulation clinic. If you are unsure  how to take this medication, talk to your nurse or doctor. Original instructions: TAKE ONE TO ONE AND A HALF TABLETS BY MOUTH DAILY AS DIRECTED BY THE COUMADIN CLINIC       Allergies  Allergen Reactions  . Cephalexin Rash    Past Medical History:  Diagnosis Date  . Chest pain July 2012   s/p repeat cath; stable anatomy. Managed medically  . Coronary artery disease   . Current use of long term anticoagulation   . Diabetes mellitus   . Dyslipidemia   . Edema   . Full dentures   . Gout   . History of acute myocardial infarction 11/2005   acute anteroseptal myocardial infarction  . Hyperlipidemia   . Hypertension   . Osteoarthritis   . Paroxysmal atrial fibrillation (HCC)   . Wears glasses     Past Surgical History:  Procedure Laterality Date  . CARDIAC CATHETERIZATION  05/18/2009  . CARDIAC CATHETERIZATION  July 2012   Stable anatomy. Manage medically  . COLONOSCOPY    . CORONARY ARTERY BYPASS GRAFT  2005  . DEBRIDEMENT AND CLOSURE WOUND Left 06/18/2014   Procedure: CLOSURE OF OPEN WOUND OF LEFT CHEEK/POSSIBLE CHEEK ADVANCEMENT/ROTATION;  Surgeon: Theodoro Kos, DO;  Location: [place];  Service: Plastics;  Laterality: Left;  . INCISION AND DRAINAGE OF WOUND Left 07/17/2014   Procedure: IRRIGATION AND DEBRIDEMENT LEFT CHEEK WOUND WITH PLACEMENT OF ACELL;  Surgeon: Theodoro Kos, DO;  Location: [place];  Service: Plastics;  Laterality: Left;  . TONSILLECTOMY      Social History   Tobacco Use  Smoking Status Former Smoker  . Types: Cigarettes  . Start date: 09/28/1962  . Quit date: 37  . Years since quitting: 32.2  Smokeless Tobacco Never Used    Social History   Substance and Sexual Activity  Alcohol Use No    Family History  Problem Relation Age of Onset  . Coronary artery disease Mother   . Heart disease Mother   . Coronary artery disease Father   . Heart disease Father   . Cancer Brother   . Cancer Brother      Review of Systems: The review of systems is as above.  All other systems were reviewed and are negative.  Physical Exam: BP (!) 146/72   Pulse 78   Ht 5\' 9"  (1.753 m)   Wt 205 lb 9.6 oz (93.3 kg)   BMI 30.36 kg/m  GENERAL:  Well appearing WM in NAD HEENT:  PERRL, EOMI, sclera are clear. Oropharynx is clear. NECK:  No jugular venous distention, carotid upstroke brisk and symmetric, no bruits, no thyromegaly or adenopathy LUNGS:  Clear to auscultation bilaterally CHEST:  Unremarkable HEART:  RRR,  PMI not displaced or sustained,S1 and S2 within normal limits, no S3, no S4: no clicks, no rubs,  no murmurs ABD:  Soft, nontender. BS +, no masses or bruits. No hepatomegaly, no splenomegaly EXT:  2 + pulses throughout, 1+ edema- support hose in place, no cyanosis no clubbing SKIN:  Warm and dry.  No rashes NEURO:  Alert and oriented x 3. Cranial nerves II through XII intact. PSYCH:  Cognitively intact  LABORATORY DATA:    Lab Results  Component Value Date   WBC 8.7 10/28/2014   HGB 14.9 10/28/2014   HCT 44.0 10/28/2014   PLT 220 10/28/2014   GLUCOSE 128 (H) 10/28/2014   CHOL 135 04/02/2012   TRIG 130.0 04/02/2012   HDL 41.30 04/02/2012   LDLCALC 68 04/02/2012   ALT 28 03/18/2011   AST 27 03/18/2011   NA 136 10/28/2014   K 3.7 10/28/2014   CL 102 10/28/2014   CREATININE 0.95 10/28/2014   BUN 16 10/28/2014   CO2 26 10/28/2014   TSH 0.882 03/19/2011   INR 2.4 11/04/2019   HGBA1C (H) 05/16/2009    6.3 (NOTE) The ADA recommends the following therapeutic goal for glycemic control related to Hgb A1c measurement: Goal of therapy: <6.5 Hgb A1c  Reference: American Diabetes Association: Clinical Practice Recommendations 2010, Diabetes Care, 2010, 33: (Suppl  1).     Labs dated 05/31/16: glucose 126. CMET normal. A1c 6.1%.  Dated 12/01/15: cholesterol 143, triglycerides 95, LDL 72, HDL 52. Dated 11/28/16: A1c 6.9%. Glucose 156, otherwise CMET normal. Cholesterol 142,  triglycerides 207, HDL 40, LDL 61 Dated 06/01/17: A1c 7%. Glucose 141, cholesterol 120, triglycerides 151, HDL 41, LDL 49. CMET normal.  Dated 11/30/17: A1c 7.1%. Glucose 137. Other chemistries normal.  Dated 10/05/18: glucose 152, A1c 7.2%. otherwise CMET and CBC normal. Cholesterol 126, triglycerides 174, HDL 37, LDL 54.  Dated 04/05/19: glucose 204, A1c  7.3%. otherwise CMET normal Dated 10/23/19: cholesterol 120, triglycerides 198, HDL 35, LDL 53. A1c 7.4%. glucose 217 otherwise CMET normal. CBC normal  Ecg today shows NSR rate 78. Nonspecific ST abnormality. I have personally reviewed and interpreted this study.   Assessment / Plan:  1. Coronary disease status post CABG in 2005. Vein graft to the OM is known to be occluded. He is asymptomatic on medical therapy. Myoview June 2016 without ischemia and normal EF.  He will continue with his medical therapy.  2. Atrial fibrillation/flutter- paroxysmal. He is on chronic Coumadin therapy. In sinus rhythm at this time. Continue current therapy  3. Hyperlipidemia. On statin therapy and fish oil. Excellent control of LDL. Mildly elevated triglycerides. Focus on weight loss and diabetic control.   4. HTN.  Under good control.   5. 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 Last A1c 7.4%. encourage weight loss.   6. Edema. Chronic.  Continue lasix, elevation, support hose, and sodium restriction  8. OSA- followed by pulmonary - has not required CPAP yet.   9. 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  I will follow up in 6 months. Check INR today.

## 2019-12-05 ENCOUNTER — Encounter: Payer: Self-pay | Admitting: Cardiology

## 2019-12-05 ENCOUNTER — Ambulatory Visit (INDEPENDENT_AMBULATORY_CARE_PROVIDER_SITE_OTHER): Payer: Medicare Other | Admitting: Cardiology

## 2019-12-05 ENCOUNTER — Ambulatory Visit (INDEPENDENT_AMBULATORY_CARE_PROVIDER_SITE_OTHER): Payer: Medicare Other | Admitting: Pharmacist

## 2019-12-05 ENCOUNTER — Other Ambulatory Visit: Payer: Self-pay

## 2019-12-05 VITALS — BP 146/72 | HR 78 | Ht 69.0 in | Wt 205.6 lb

## 2019-12-05 DIAGNOSIS — I4891 Unspecified atrial fibrillation: Secondary | ICD-10-CM | POA: Diagnosis not present

## 2019-12-05 DIAGNOSIS — Z7901 Long term (current) use of anticoagulants: Secondary | ICD-10-CM | POA: Diagnosis not present

## 2019-12-05 DIAGNOSIS — I25708 Atherosclerosis of coronary artery bypass graft(s), unspecified, with other forms of angina pectoris: Secondary | ICD-10-CM | POA: Diagnosis not present

## 2019-12-05 DIAGNOSIS — I2510 Atherosclerotic heart disease of native coronary artery without angina pectoris: Secondary | ICD-10-CM

## 2019-12-05 DIAGNOSIS — I10 Essential (primary) hypertension: Secondary | ICD-10-CM

## 2019-12-05 DIAGNOSIS — E782 Mixed hyperlipidemia: Secondary | ICD-10-CM | POA: Diagnosis not present

## 2019-12-05 DIAGNOSIS — I480 Paroxysmal atrial fibrillation: Secondary | ICD-10-CM | POA: Diagnosis not present

## 2019-12-05 LAB — POCT INR: INR: 1.9 — AB (ref 2.0–3.0)

## 2019-12-05 MED ORDER — ATORVASTATIN CALCIUM 40 MG PO TABS: 40.0000 mg | ORAL_TABLET | Freq: Every day | ORAL | 3 refills | Status: DC

## 2019-12-05 MED ORDER — WARFARIN SODIUM 5 MG PO TABS: ORAL_TABLET | ORAL | 3 refills | Status: DC

## 2019-12-19 ENCOUNTER — Encounter: Payer: Self-pay | Admitting: Pulmonary Disease

## 2019-12-19 ENCOUNTER — Ambulatory Visit (INDEPENDENT_AMBULATORY_CARE_PROVIDER_SITE_OTHER): Payer: Medicare Other | Admitting: Pulmonary Disease

## 2019-12-19 ENCOUNTER — Other Ambulatory Visit: Payer: Self-pay

## 2019-12-19 VITALS — BP 112/60 | HR 82 | Temp 97.2°F | Ht 69.0 in | Wt 202.2 lb

## 2019-12-19 DIAGNOSIS — G4733 Obstructive sleep apnea (adult) (pediatric): Secondary | ICD-10-CM | POA: Diagnosis not present

## 2019-12-19 NOTE — Patient Instructions (Signed)
Mild obstructive sleep apnea No significant symptoms at present  We will keep monitoring symptoms Continue weight loss efforts  Sleep with the head of the bed elevated Encourage lateral sleep position  I will see you back in about 6 months Call if there is any significant changes to your health or how you feel regarding your sleep  Get more information about obstructive sleep apnea

## 2019-12-19 NOTE — Progress Notes (Signed)
[name]    [id number]    September 24, 1940  Primary Care Physician:Howell, Bryn Gulling, MD  Referring Physician: Helane Rima, MD [address] [place],  [place] [address]  Chief complaint:   Shortness of breath Abnormal breathing when he is sleeping at night noted by family members-witnessed apneas noted  HPI: Patient has a diagnosis of mild obstructive sleep apnea He feels well, denies any significant symptoms Feels he is sleeping well He has no significant concerns about his breathing currently  He was recently followed up by cardiology-doing well He has no new symptoms Breathing feels good Has been off Anoro  Albuterol use only once or twice a week  Mild obstructive sleep apnea Abnormal PFT showing moderate obstructive lung disease  Patient with a history of shortness of breath  Abnormal breathing at night He had a recent sleep study which revealed mild obstructive sleep apnea  Significant smoking history-to 3 packs a day 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 He is very active usually He gets short of breath walking up a flight of steps Gets short of breath with significant exertion  Denies a chronic cough Has no fevers or chills   Occupation: Trucking Smoking history: Reformed smoker  Outpatient Encounter Medications as of 12/19/2019  Medication Sig  . acetaminophen (TYLENOL) 500 MG tablet Take 1,000 mg by mouth daily.   Marland Kitchen albuterol (VENTOLIN HFA) 108 (90 Base) MCG/ACT inhaler INHALE TWO PUFFS INTO THE LUNGS EVERY 6 HOURS AS NEEDED FOR WHEEZING OR SHORTNESS OF BREATH  . allopurinol (ZYLOPRIM) 300 MG tablet TAKE ONE TABLET BY MOUTH DAILY  . amLODipine (NORVASC) 10 MG tablet TAKE ONE TABLET BY MOUTH DAILY  . Ascorbic Acid (VITAMIN C) 500 MG  tablet Take 1,000 mg by mouth daily.   Marland Kitchen atorvastatin (LIPITOR) 40 MG tablet Take 1 tablet (40 mg total) by mouth daily.  Marland Kitchen augmented betamethasone dipropionate (DIPROLENE-AF) 0.05 % cream Use as directed  . b complex vitamins tablet Take 1 tablet by mouth daily.   . cloNIDine (CATAPRES) 0.2 MG tablet TAKE ONE TABLET BY MOUTH TWICE A DAY  . fish oil-omega-3 fatty acids 1000 MG capsule Take 1 g by mouth 2 (two) times daily.   . furosemide (LASIX) 20 MG tablet TAKE ONE TABLET BY MOUTH DAILY  . GLIPIZIDE PO Take 5 mg by mouth daily.   . isosorbide mononitrate (IMDUR) 30 MG 24 hr tablet TAKE 1 TABLET BY MOUTH DAILY  . Lancets (ONETOUCH ULTRASOFT) lancets   . lisinopril (ZESTRIL) 40 MG tablet TAKE 1 TABLET BY MOUTH DAILY.  . metFORMIN (GLUCOPHAGE-XR) 500 MG 24 hr tablet Take 2 tablets by mouth 2 (two) times daily. Take 2 tabs twice daily  . metoprolol succinate (TOPROL-XL) 100 MG 24 hr tablet TAKE ONE TABLET BY MOUTH DAILY WITH OR IMMEDIATEDLY FOLLOWING A MEAL  . Misc Natural Products (GLUCOSAMINE CHOND MSM FORMULA PO) Take 2 capsules by mouth daily.  . Multiple Vitamin (MULTIVITAMIN WITH MINERALS) TABS tablet Take 1 tablet by mouth daily.  . nitroGLYCERIN (NITROSTAT) 0.4 MG SL tablet PLACE 1 TABLET (0.4 MG TOTAL) UNDER THE TONGUE EVERY 5  MINUTES AS NEEDED FOR CHEST PAIN.  . ONE TOUCH ULTRA TEST test strip   .  warfarin (COUMADIN) 5 MG tablet TAKE ONE TO ONE AND A HALF TABLETS BY MOUTH DAILY AS DIRECTED BY THE COUMADIN CLINIC   No facility-administered encounter medications on file as of 12/19/2019.    Allergies as of 12/19/2019 - Review Complete 12/19/2019  Allergen Reaction Noted  . Cephalexin Rash 12/20/2010    Past Medical History:  Diagnosis Date  . Chest pain July 2012   s/p repeat cath; stable anatomy. Managed medically  . Coronary artery disease   . Current use of long term anticoagulation   . Diabetes mellitus   . Dyslipidemia   . Edema   . Full dentures   . Gout   . History  of acute myocardial infarction 11/2005   acute anteroseptal myocardial infarction  . Hyperlipidemia   . Hypertension   . Osteoarthritis   . Paroxysmal atrial fibrillation (HCC)   . Wears glasses     Past Surgical History:  Procedure Laterality Date  . CARDIAC CATHETERIZATION  05/18/2009  . CARDIAC CATHETERIZATION  July 2012   Stable anatomy. Manage medically  . COLONOSCOPY    . CORONARY ARTERY BYPASS GRAFT  2005  . DEBRIDEMENT AND CLOSURE WOUND Left 06/18/2014   Procedure: CLOSURE OF OPEN WOUND OF LEFT CHEEK/POSSIBLE CHEEK ADVANCEMENT/ROTATION;  Surgeon: Theodoro Kos, DO;  Location: [place];  Service: Plastics;  Laterality: Left;  . INCISION AND DRAINAGE OF WOUND Left 07/17/2014   Procedure: IRRIGATION AND DEBRIDEMENT LEFT CHEEK WOUND WITH PLACEMENT OF ACELL;  Surgeon: Theodoro Kos, DO;  Location: [place];  Service: Plastics;  Laterality: Left;  . TONSILLECTOMY      Family History  Problem Relation Age of Onset  . Coronary artery disease Mother   . Heart disease Mother   . Coronary artery disease Father   . Heart disease Father   . Cancer Brother   . Cancer Brother     Social History   Socioeconomic History  . Marital status: Single    Spouse name: Not on file  . Number of children: Not on file  . Years of education: Not on file  . Highest education level: Not on file  Occupational History  . Not on file  Tobacco Use  . Smoking status: Former Smoker    Packs/day: 3.00    Years: 30.00    Pack years: 90.00    Types: Cigarettes    Start date: 09/28/1962    Quit date: 1989    Years since quitting: 32.3  . Smokeless tobacco: Never Used  Substance and Sexual Activity  . Alcohol use: No  . Drug use: No  . Sexual activity: Never  Other Topics Concern  . Not on file  Social History Narrative  . Not on file   Social Determinants of Health   Financial Resource Strain:   . Difficulty of Paying Living Expenses:   Food  Insecurity:   . Worried About Charity fundraiser in the Last Year:   . Arboriculturist in the Last Year:   Transportation Needs:   . Film/video editor (Medical):   Marland Kitchen Lack of Transportation (Non-Medical):   Physical Activity:   . Days of Exercise per Week:   . Minutes of Exercise per Session:   Stress:   . Feeling of Stress :   Social Connections:   . Frequency of Communication with Friends and Family:   . Frequency of Social Gatherings with Friends and Family:   . Attends Religious Services:   .  Active Member of Clubs or Organizations:   . Attends Archivist Meetings:   Marland Kitchen Marital Status:   Intimate Partner Violence:   . Fear of Current or Ex-Partner:   . Emotionally Abused:   Marland Kitchen Physically Abused:   . Sexually Abused:     Review of Systems  Constitutional: Negative for fatigue.  HENT: Negative.   Eyes: Negative.   Respiratory: Positive for apnea. Negative for cough, shortness of breath and wheezing.   Cardiovascular: Negative.  Negative for chest pain and leg swelling.  Gastrointestinal: Negative.   Endocrine: Negative.   Musculoskeletal: Negative.   Skin: Negative.   Psychiatric/Behavioral: Positive for sleep disturbance.  All other systems reviewed and are negative.  Vitals:   12/19/19 0848  BP: 112/60  Pulse: 82  Temp: (!) 97.2 F (36.2 C)  SpO2: 94%   Physical Exam  Constitutional: He appears well-developed and well-nourished. No distress.  Overweight  HENT:  Head: Normocephalic and atraumatic.  Eyes: Pupils are equal, round, and reactive to light. Conjunctivae and EOM are normal. Right eye exhibits no discharge. Left eye exhibits no discharge.  Neck: No thyromegaly present.  Cardiovascular: Normal rate, regular rhythm and normal heart sounds.  Pulmonary/Chest: Effort normal and breath sounds normal. No respiratory distress. He has no wheezes. He has no rales. He exhibits no tenderness.  Reduced air movement bilaterally  Musculoskeletal:      Cervical back: Normal range of motion and neck supple.  Skin: He is not diaphoretic.  Psychiatric: He has a normal mood and affect. His behavior is normal.   Data Reviewed: Recent sleep study reviewed showing mild obstructive sleep apnea with mild oxygen desaturations  Echocardiogram from 2015 revealed ejection fraction of 60 to 123456, diastolic dysfunction  Pulmonary function study does reveal moderate obstructive disease Results of the Epworth flowsheet 07/15/2019 09/12/2018  Sitting and reading 2 0  Watching TV 2 2  Sitting, inactive in a public place (e.g. a theatre or a meeting) 0 0  As a passenger in a car for an hour without a break 1 0  Lying down to rest in the afternoon when circumstances permit 3 3  Sitting and talking to someone 0 0  Sitting quietly after a lunch without alcohol 2 2  In a car, while stopped for a few minutes in traffic 0 0  Total score 10 7   Assessment:   Shortness of breath -Likely moderate obstructive lung disease -Has been off Anoro -Seems to be doing well with use of albuterol as needed  Mild obstructive sleep apnea -Sleep study reviewed -He wants to continue with symptom monitoring -Focus on weight loss efforts   Obesity Significant smoking history Significant history of heart disease, diastolic dysfunction, atrial fibrillation in the past Has no significant symptoms of shortness of breath at rest  Plan/Recommendations: .  Continue albuterol as needed .  Anoro discontinued .  Auto titrating CPAP will be appropriate but does not want to initiate any treatment at present, he is focusing on weight loss efforts .  Sleep position modification  .  Encourage to call us if he decides to go ahead with CPAP  .  I will see him back in about 6 months .  Seek more information about obstructive sleep apnea  Sherrilyn Rist MD [facility] 12/19/2019, 8:59 AM  CC: Helane Rima, MD

## 2019-12-23 ENCOUNTER — Ambulatory Visit (INDEPENDENT_AMBULATORY_CARE_PROVIDER_SITE_OTHER): Payer: Medicare Other | Admitting: *Deleted

## 2019-12-23 ENCOUNTER — Other Ambulatory Visit: Payer: Self-pay

## 2019-12-23 DIAGNOSIS — I4891 Unspecified atrial fibrillation: Secondary | ICD-10-CM | POA: Diagnosis not present

## 2019-12-23 DIAGNOSIS — Z5181 Encounter for therapeutic drug level monitoring: Secondary | ICD-10-CM

## 2019-12-23 DIAGNOSIS — Z7901 Long term (current) use of anticoagulants: Secondary | ICD-10-CM

## 2019-12-23 LAB — POCT INR: INR: 2.5 (ref 2.0–3.0)

## 2019-12-23 NOTE — Patient Instructions (Signed)
Description   Continue on same dosage 1.5 tablets daily except 1 tablet on Sundays, Tuesdays and Thursdays.  Recheck in 4 weeks. Call us with any medication changes or concerns # [phone] [facility] # [phone].

## 2020-01-20 ENCOUNTER — Other Ambulatory Visit: Payer: Self-pay

## 2020-01-20 ENCOUNTER — Ambulatory Visit (INDEPENDENT_AMBULATORY_CARE_PROVIDER_SITE_OTHER): Payer: Medicare Other | Admitting: *Deleted

## 2020-01-20 DIAGNOSIS — Z7901 Long term (current) use of anticoagulants: Secondary | ICD-10-CM | POA: Diagnosis not present

## 2020-01-20 DIAGNOSIS — Z5181 Encounter for therapeutic drug level monitoring: Secondary | ICD-10-CM

## 2020-01-20 DIAGNOSIS — I4891 Unspecified atrial fibrillation: Secondary | ICD-10-CM

## 2020-01-20 LAB — POCT INR: INR: 2.3 (ref 2.0–3.0)

## 2020-01-20 NOTE — Patient Instructions (Signed)
Description   Continue on same dosage 1.5 tablets daily except 1 tablet on Sundays, Tuesdays and Thursdays.  Recheck in 5 weeks. Call us with any medication changes or concerns # [phone] [facility] Main # [phone].

## 2020-02-15 ENCOUNTER — Other Ambulatory Visit: Payer: Self-pay | Admitting: Cardiology

## 2020-02-24 ENCOUNTER — Other Ambulatory Visit: Payer: Self-pay | Admitting: Cardiology

## 2020-02-24 DIAGNOSIS — I10 Essential (primary) hypertension: Secondary | ICD-10-CM

## 2020-02-24 DIAGNOSIS — I480 Paroxysmal atrial fibrillation: Secondary | ICD-10-CM

## 2020-02-24 DIAGNOSIS — Z7901 Long term (current) use of anticoagulants: Secondary | ICD-10-CM

## 2020-02-24 DIAGNOSIS — I2510 Atherosclerotic heart disease of native coronary artery without angina pectoris: Secondary | ICD-10-CM

## 2020-02-24 DIAGNOSIS — E785 Hyperlipidemia, unspecified: Secondary | ICD-10-CM

## 2020-02-25 ENCOUNTER — Other Ambulatory Visit: Payer: Self-pay | Admitting: Cardiology

## 2020-02-25 ENCOUNTER — Ambulatory Visit (INDEPENDENT_AMBULATORY_CARE_PROVIDER_SITE_OTHER): Payer: Medicare Other

## 2020-02-25 ENCOUNTER — Other Ambulatory Visit: Payer: Self-pay

## 2020-02-25 DIAGNOSIS — I4891 Unspecified atrial fibrillation: Secondary | ICD-10-CM | POA: Diagnosis not present

## 2020-02-25 DIAGNOSIS — Z7901 Long term (current) use of anticoagulants: Secondary | ICD-10-CM | POA: Diagnosis not present

## 2020-02-25 DIAGNOSIS — Z5181 Encounter for therapeutic drug level monitoring: Secondary | ICD-10-CM

## 2020-02-25 LAB — POCT INR: INR: 2.1 (ref 2.0–3.0)

## 2020-02-25 NOTE — Patient Instructions (Signed)
Continue on same dosage 1.5 tablets daily except 1 tablet on Sundays, Tuesdays and Thursdays.  Recheck in 6 weeks. Call us with any medication changes or concerns # [phone] [facility], Main # [phone].

## 2020-03-21 IMAGING — DX DG CHEST 2V
2 series · 2 of 2 positions shown · non-contrast
Comparison: Portable chest x-ray March 19, 2011

CLINICAL DATA: Chronic intermittent shortness of breath. Former
smoker. History of CABG.

EXAM:
CHEST - 2 VIEW

[chest pa]
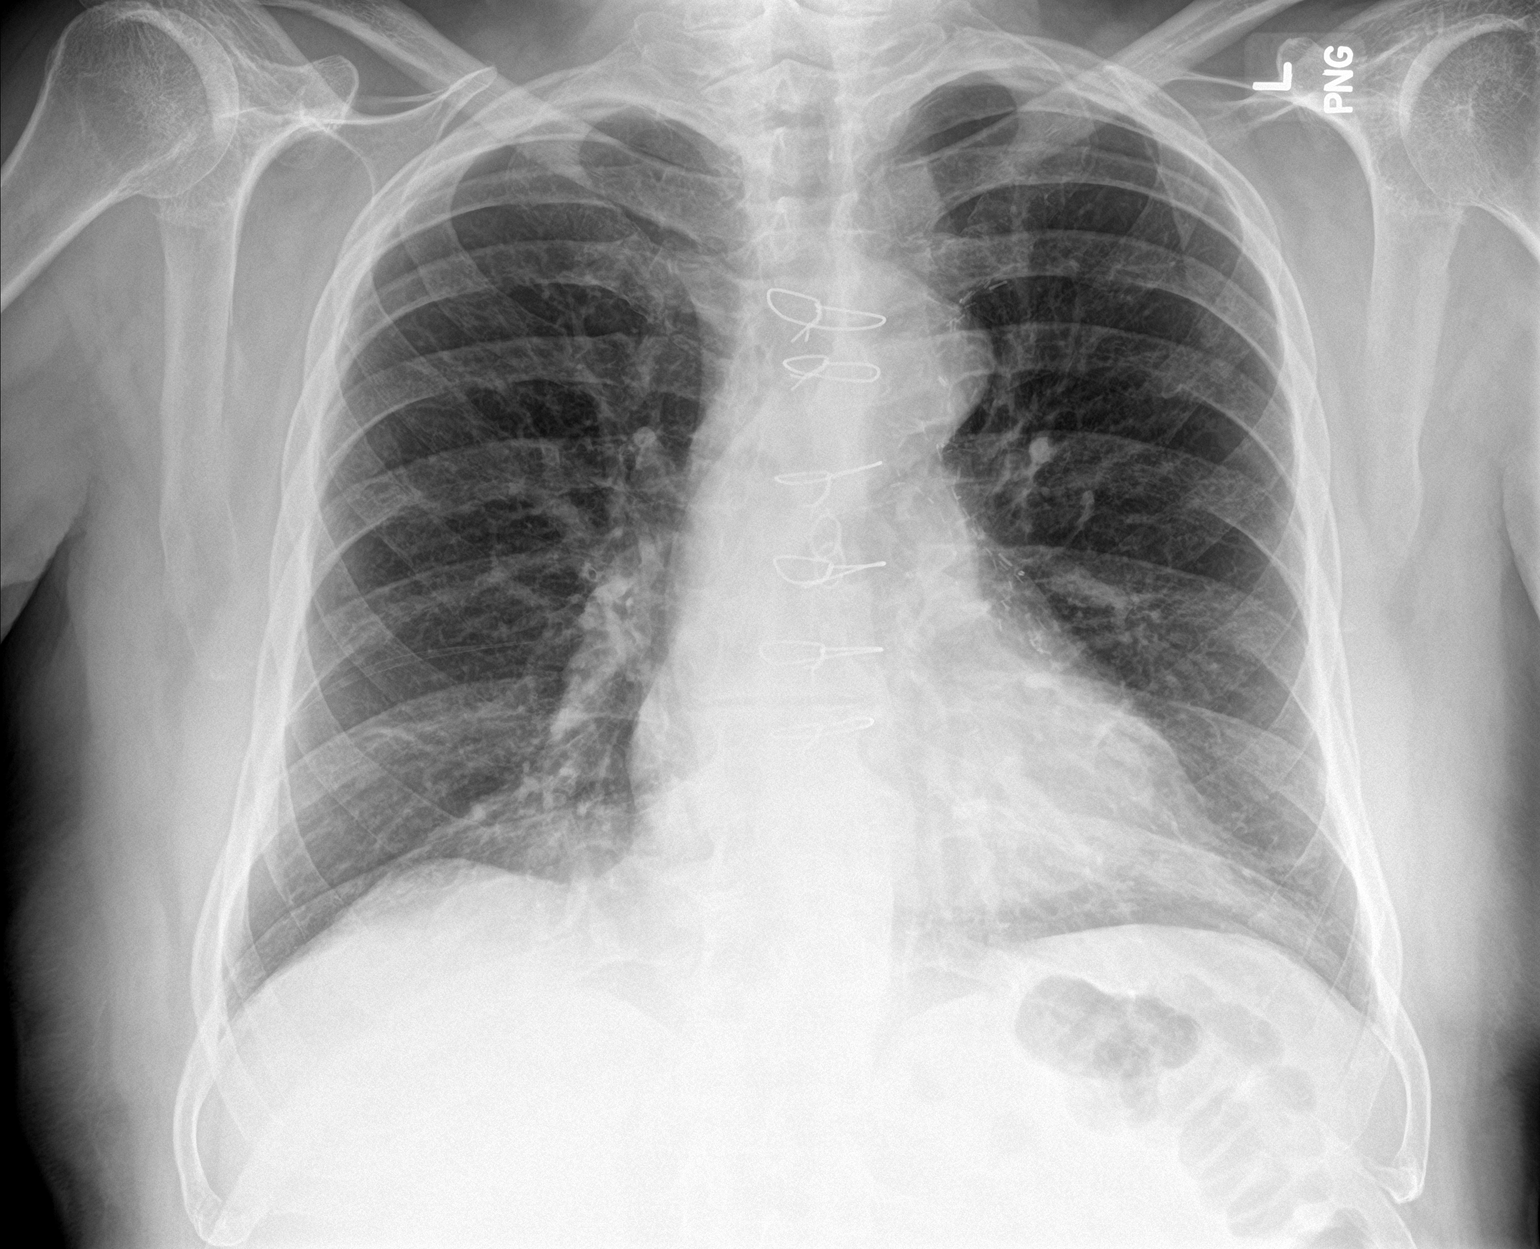

[chest lat]
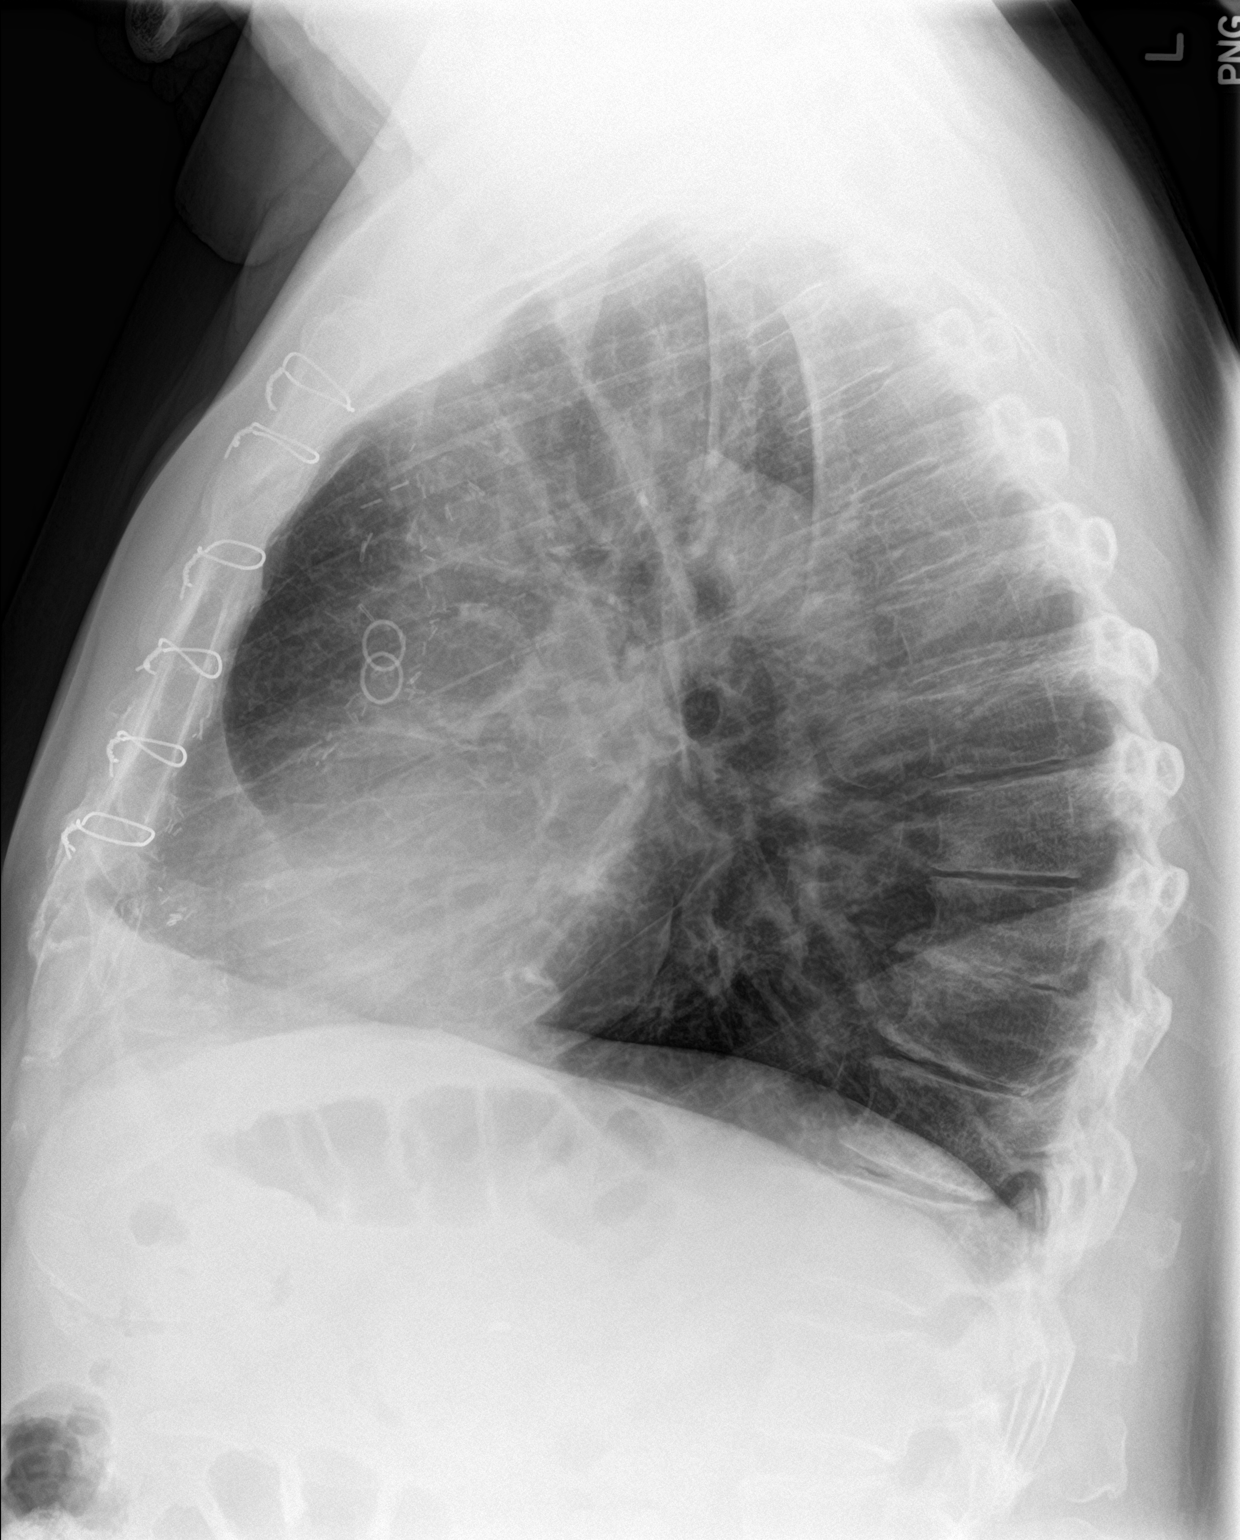

[2 of 2 positions shown; findings below may reference images not displayed]

FINDINGS: The lungs are adequately inflated and exhibit mild hemidiaphragm
flattening.. There is no focal infiltrate. The interstitial markings
are coarse though stable. The heart is normal in size. The pulmonary
vascularity is not engorged. There is mild prominence of the aortic
arch. There is mural calcification within the aorta. The sternal
wires are intact. There is multilevel degenerative disc disease of
the thoracic spine.
IMPRESSION: COPD.  No acute pneumonia.  Post CABG changes.  No CHF.

Thoracic aortic atherosclerosis.

## 2020-04-06 ENCOUNTER — Other Ambulatory Visit: Payer: Self-pay

## 2020-04-06 ENCOUNTER — Ambulatory Visit (INDEPENDENT_AMBULATORY_CARE_PROVIDER_SITE_OTHER): Payer: Medicare Other | Admitting: *Deleted

## 2020-04-06 DIAGNOSIS — Z5181 Encounter for therapeutic drug level monitoring: Secondary | ICD-10-CM | POA: Diagnosis not present

## 2020-04-06 DIAGNOSIS — Z7901 Long term (current) use of anticoagulants: Secondary | ICD-10-CM | POA: Diagnosis not present

## 2020-04-06 DIAGNOSIS — I4891 Unspecified atrial fibrillation: Secondary | ICD-10-CM

## 2020-04-06 LAB — POCT INR: INR: 2.8 (ref 2.0–3.0)

## 2020-04-06 NOTE — Patient Instructions (Signed)
Description   Continue on same dosage 1.5 tablets daily except 1 tablet on Sundays, Tuesdays and Thursdays.  Recheck in 6 weeks. Call us with any medication changes or concerns # [phone] [facility], Main # [phone].     

## 2020-05-02 ENCOUNTER — Other Ambulatory Visit: Payer: Self-pay | Admitting: Cardiology

## 2020-05-02 DIAGNOSIS — I10 Essential (primary) hypertension: Secondary | ICD-10-CM

## 2020-05-02 DIAGNOSIS — I2510 Atherosclerotic heart disease of native coronary artery without angina pectoris: Secondary | ICD-10-CM

## 2020-05-02 DIAGNOSIS — I480 Paroxysmal atrial fibrillation: Secondary | ICD-10-CM

## 2020-05-02 DIAGNOSIS — E785 Hyperlipidemia, unspecified: Secondary | ICD-10-CM

## 2020-05-02 DIAGNOSIS — Z7901 Long term (current) use of anticoagulants: Secondary | ICD-10-CM

## 2020-05-18 ENCOUNTER — Other Ambulatory Visit: Payer: Self-pay

## 2020-05-18 ENCOUNTER — Ambulatory Visit (INDEPENDENT_AMBULATORY_CARE_PROVIDER_SITE_OTHER): Payer: Medicare Other | Admitting: Pharmacist

## 2020-05-18 DIAGNOSIS — I4891 Unspecified atrial fibrillation: Secondary | ICD-10-CM

## 2020-05-18 DIAGNOSIS — Z5181 Encounter for therapeutic drug level monitoring: Secondary | ICD-10-CM | POA: Diagnosis not present

## 2020-05-18 DIAGNOSIS — Z7901 Long term (current) use of anticoagulants: Secondary | ICD-10-CM

## 2020-05-18 LAB — POCT INR: INR: 2.6 (ref 2.0–3.0)

## 2020-05-18 NOTE — Patient Instructions (Signed)
Description   Continue on same dosage 1.5 tablets daily except 1 tablet on Sundays, Tuesdays and Thursdays.  Recheck in 6 weeks. Call us with any medication changes or concerns # [phone] [facility], Main # [phone].     

## 2020-05-27 NOTE — Progress Notes (Signed)
[name] Date of Birth: [birth year]   History of Present Illness: [name] is seen today for follow up CAD and atrial fibrillation/flutter.  He has a history of coronary disease with prior coronary bypass surgery in 2005. Last evaluation with cardiac catheterization in July of 2012 showed a patent LIMA graft to the LAD. The second marginal branch was occluded and the first marginal branch had a moderately severe stenosis. This is treated medically. His last Myoview study here in November 2013 showed a small apical fixed defect without ischemia. Ejection fraction 69%. He does have a history of paroxysmal atrial fibrillation and is on chronic Coumadin therapy.  In February 2015 he had atrial flutter that resolved with IV Cardizem.  Echo in March 2015 showed a normal study except for mild diastolic dysfunction. In June 2016 he was visiting family in [place] when he developed severe HTN with BP increased to 218/118. He was admitted to the hospital in [facility], [place] and had extensive work up there. Myoview study showed a fixed inferior defect without ischemia and normal EF. Echo was normal.    He is now followed by pulmonary for COPD and sleep apnea. He has not required CPAP yet.   On follow up today he is seen with his son.  He states the swelling in his legs is stable.  No chest pain or dyspnea. Uses albuterol about once a week. No palpitations. Has lost another 5 lbs and states sugars have improved. INR 2.6 2 weeks ago. His son reports that every time his father visits him in [place] his BP will severely spike. No change in diet to explain this. He is sedentary. Reports BP OK at home but it is elevated today.   Allergies as of 06/02/2020      Reactions   Cephalexin Rash      Medication List       Accurate as of June 02, 2020  2:50 PM. If you have any questions, ask your nurse or doctor.        acetaminophen 500 MG tablet Commonly known as: TYLENOL Take 1,000 mg by mouth daily.    albuterol 108 (90 Base) MCG/ACT inhaler Commonly known as: VENTOLIN HFA INHALE TWO PUFFS INTO THE LUNGS EVERY 6 HOURS AS NEEDED FOR WHEEZING OR SHORTNESS OF BREATH   allopurinol 300 MG tablet Commonly known as: ZYLOPRIM TAKE ONE TABLET BY MOUTH DAILY   amLODipine 10 MG tablet Commonly known as: NORVASC TAKE ONE TABLET BY MOUTH DAILY   atorvastatin 40 MG tablet Commonly known as: LIPITOR Take 1 tablet (40 mg total) by mouth daily.   augmented betamethasone dipropionate 0.05 % cream Commonly known as: DIPROLENE-AF Use as directed   b complex vitamins tablet Take 1 tablet by mouth daily.   cloNIDine 0.2 MG tablet Commonly known as: CATAPRES TAKE ONE TABLET BY MOUTH TWICE A DAY   fish oil-omega-3 fatty acids 1000 MG capsule Take 1 g by mouth 2 (two) times daily.   furosemide 20 MG tablet Commonly known as: LASIX TAKE ONE TABLET BY MOUTH DAILY   GLIPIZIDE PO Take 5 mg by mouth daily.   GLUCOSAMINE CHOND MSM FORMULA PO Take 2 capsules by mouth daily.   hydrALAZINE 25 MG tablet Commonly known as: APRESOLINE Take 1 tablet (25 mg total) by mouth 3 (three) times daily. Started by: Berdella Bacot Martinique, MD   isosorbide mononitrate 30 MG 24 hr tablet Commonly known as: IMDUR TAKE ONE TABLET BY MOUTH DAILY   lisinopril 40  MG tablet Commonly known as: ZESTRIL TAKE 1 TABLET BY MOUTH DAILY.   metFORMIN 500 MG 24 hr tablet Commonly known as: GLUCOPHAGE-XR Take 2 tablets by mouth 2 (two) times daily. Take 2 tabs twice daily   metoprolol succinate 100 MG 24 hr tablet Commonly known as: TOPROL-XL TAKE ONE TABLET BY MOUTH DAILY WITH OR IMMEDIATELY FOLLOWING A MEAL   multivitamin with minerals Tabs tablet Take 1 tablet by mouth daily.   nitroGLYCERIN 0.4 MG SL tablet Commonly known as: Nitrostat PLACE 1 TABLET (0.4 MG TOTAL) UNDER THE TONGUE EVERY 5  MINUTES AS NEEDED FOR CHEST PAIN.   ONE TOUCH ULTRA TEST test strip Generic drug: glucose blood   onetouch ultrasoft  lancets   vitamin C 500 MG tablet Commonly known as: ASCORBIC ACID Take 1,000 mg by mouth daily.   warfarin 5 MG tablet Commonly known as: COUMADIN Take as directed by the anticoagulation clinic. If you are unsure how to take this medication, talk to your nurse or doctor. Original instructions: TAKE ONE TO ONE AND A HALF TABLETS BY MOUTH DAILY AS DIRECTED BY THE COUMADIN CLINIC       Allergies  Allergen Reactions   Cephalexin Rash    Past Medical History:  Diagnosis Date   Chest pain July 2012   s/p repeat cath; stable anatomy. Managed medically   Coronary artery disease    Current use of long term anticoagulation    Diabetes mellitus    Dyslipidemia    Edema    Full dentures    Gout    History of acute myocardial infarction 11/2005   acute anteroseptal myocardial infarction   Hyperlipidemia    Hypertension    Osteoarthritis    Paroxysmal atrial fibrillation [place])    Wears glasses     Past Surgical History:  Procedure Laterality Date   CARDIAC CATHETERIZATION  05/18/2009   CARDIAC CATHETERIZATION  July 2012   Stable anatomy. Manage medically   COLONOSCOPY     CORONARY ARTERY BYPASS GRAFT  2005   DEBRIDEMENT AND CLOSURE WOUND Left 06/18/2014   Procedure: CLOSURE OF OPEN WOUND OF LEFT CHEEK/POSSIBLE CHEEK ADVANCEMENT/ROTATION;  Surgeon: Theodoro Kos, DO;  Location: [place];  Service: Plastics;  Laterality: Left;   INCISION AND DRAINAGE OF WOUND Left 07/17/2014   Procedure: IRRIGATION AND DEBRIDEMENT LEFT CHEEK WOUND WITH PLACEMENT OF ACELL;  Surgeon: Theodoro Kos, DO;  Location: [place];  Service: Plastics;  Laterality: Left;   TONSILLECTOMY      Social History   Tobacco Use  Smoking Status Former Smoker   Packs/day: 3.00   Years: 30.00   Pack years: 90.00   Types: Cigarettes   Start date: 09/28/1962   Quit date: 1989   Years since quitting: 32.7  Smokeless Tobacco Never Used    Social  History   Substance and Sexual Activity  Alcohol Use No    Family History  Problem Relation Age of Onset   Coronary artery disease Mother    Heart disease Mother    Coronary artery disease Father    Heart disease Father    Cancer Brother    Cancer Brother     Review of Systems: The review of systems is as above.  All other systems were reviewed and are negative.  Physical Exam: BP (!) 158/66    Pulse 72    Ht 5\' 9"  (1.753 m)    Wt 197 lb (89.4 kg)    SpO2 95%  BMI 29.09 kg/m  GENERAL:  Well appearing WM in NAD HEENT:  PERRL, EOMI, sclera are clear. Oropharynx is clear. NECK:  No jugular venous distention, carotid upstroke brisk and symmetric, no bruits, no thyromegaly or adenopathy LUNGS:  Clear to auscultation bilaterally CHEST:  Unremarkable HEART:  RRR,  PMI not displaced or sustained,S1 and S2 within normal limits, no S3, no S4: no clicks, no rubs, no murmurs ABD:  Soft, nontender. BS +, no masses or bruits. No hepatomegaly, no splenomegaly EXT:  2 + pulses throughout, 1+ edema- support hose in place, no cyanosis no clubbing SKIN:  Warm and dry.  No rashes NEURO:  Alert and oriented x 3. Cranial nerves II through XII intact. PSYCH:  Cognitively intact  LABORATORY DATA:    Lab Results  Component Value Date   WBC 8.7 10/28/2014   HGB 14.9 10/28/2014   HCT 44.0 10/28/2014   PLT 220 10/28/2014   GLUCOSE 128 (H) 10/28/2014   CHOL 135 04/02/2012   TRIG 130.0 04/02/2012   HDL 41.30 04/02/2012   LDLCALC 68 04/02/2012   ALT 28 03/18/2011   AST 27 03/18/2011   NA 136 10/28/2014   K 3.7 10/28/2014   CL 102 10/28/2014   CREATININE 0.95 10/28/2014   BUN 16 10/28/2014   CO2 26 10/28/2014   TSH 0.882 03/19/2011   INR 2.6 05/18/2020   HGBA1C (H) 05/16/2009    6.3 (NOTE) The ADA recommends the following therapeutic goal for glycemic control related to Hgb A1c measurement: Goal of therapy: <6.5 Hgb A1c  Reference: American Diabetes Association: Clinical Practice  Recommendations 2010, Diabetes Care, 2010, 33: (Suppl  1).     Labs dated 05/31/16: glucose 126. CMET normal. A1c 6.1%.  Dated 12/01/15: cholesterol 143, triglycerides 95, LDL 72, HDL 52. Dated 11/28/16: A1c 6.9%. Glucose 156, otherwise CMET normal. Cholesterol 142, triglycerides 207, HDL 40, LDL 61 Dated 06/01/17: A1c 7%. Glucose 141, cholesterol 120, triglycerides 151, HDL 41, LDL 49. CMET normal.  Dated 11/30/17: A1c 7.1%. Glucose 137. Other chemistries normal.  Dated 10/05/18: glucose 152, A1c 7.2%. otherwise CMET and CBC normal. Cholesterol 126, triglycerides 174, HDL 37, LDL 54.  Dated 04/05/19: glucose 204, A1c  7.3%. otherwise CMET normal Dated 10/23/19: cholesterol 120, triglycerides 198, HDL 35, LDL 53. A1c 7.4%. glucose 217 otherwise CMET normal. CBC normal Dated 04/24/20: A1c 7.2%. Glucose 140, otherwise CMET normal.     Assessment / Plan:  1. Coronary disease status post CABG in 2005. Vein graft to the OM is known to be occluded. He is asymptomatic on medical therapy. Last Myoview in 2018 without ischemia and normal EF.  He will continue with his medical therapy.  2. Atrial fibrillation/flutter- paroxysmal. He is on chronic Coumadin therapy. In sinus rhythm at this time. Continue current therapy  3. Hyperlipidemia. On statin therapy and fish oil. Excellent control of LDL. Mildly elevated triglycerides. Focus on weight loss and diabetic control.   4. HTN.  BP elevated and he tends to develop hypertensive episodes when traveling. Will check renal dopplers to rule out RAS. Will add hydralazine 25 mg tid to current regimen. Instructed to take additional if BP is over 170. We discussed lifestyle modification. Really needs to increase aerobic activity. He is losing weight and does follow a low salt diet.   5. 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 Last A1c 7.4%. encourage weight loss.   6. Edema. Chronic.  Continue lasix, elevation, support hose, and sodium restriction  8. OSA- followed by pulmonary -  has  not required CPAP yet.   9. 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  I will follow up in 3 months.

## 2020-06-02 ENCOUNTER — Other Ambulatory Visit: Payer: Self-pay

## 2020-06-02 ENCOUNTER — Encounter: Payer: Self-pay | Admitting: Cardiology

## 2020-06-02 ENCOUNTER — Ambulatory Visit (INDEPENDENT_AMBULATORY_CARE_PROVIDER_SITE_OTHER): Payer: Medicare Other | Admitting: Cardiology

## 2020-06-02 VITALS — BP 158/66 | HR 72 | Ht 69.0 in | Wt 197.0 lb

## 2020-06-02 DIAGNOSIS — Z7901 Long term (current) use of anticoagulants: Secondary | ICD-10-CM

## 2020-06-02 DIAGNOSIS — I4891 Unspecified atrial fibrillation: Secondary | ICD-10-CM | POA: Diagnosis not present

## 2020-06-02 DIAGNOSIS — I10 Essential (primary) hypertension: Secondary | ICD-10-CM

## 2020-06-02 DIAGNOSIS — I25708 Atherosclerosis of coronary artery bypass graft(s), unspecified, with other forms of angina pectoris: Secondary | ICD-10-CM

## 2020-06-02 DIAGNOSIS — I2510 Atherosclerotic heart disease of native coronary artery without angina pectoris: Secondary | ICD-10-CM | POA: Diagnosis not present

## 2020-06-02 MED ORDER — HYDRALAZINE HCL 25 MG PO TABS
25.0000 mg | ORAL_TABLET | Freq: Three times a day (TID) | ORAL | 3 refills | Status: DC
Start: 2020-06-02 — End: 2021-08-23

## 2020-06-02 NOTE — Patient Instructions (Signed)
We will schedule you for a renal doppler.  Add hydralazine 25 mg three times a day for blood pressure  You may double dose as needed if BP spikes to greater than 461 systolic.

## 2020-06-22 ENCOUNTER — Other Ambulatory Visit: Payer: Self-pay

## 2020-06-22 ENCOUNTER — Ambulatory Visit (HOSPITAL_COMMUNITY)
Admission: RE | Admit: 2020-06-22 | Discharge: 2020-06-22 | Disposition: A | Discharge status: Home / Self Care | Payer: Medicare Other | Source: Ambulatory Visit | Attending: Cardiovascular Disease | Admitting: Cardiovascular Disease

## 2020-06-22 DIAGNOSIS — I10 Essential (primary) hypertension: Secondary | ICD-10-CM | POA: Insufficient documentation

## 2020-06-29 ENCOUNTER — Ambulatory Visit (INDEPENDENT_AMBULATORY_CARE_PROVIDER_SITE_OTHER): Payer: Medicare Other | Admitting: *Deleted

## 2020-06-29 ENCOUNTER — Other Ambulatory Visit: Payer: Self-pay

## 2020-06-29 DIAGNOSIS — I4891 Unspecified atrial fibrillation: Secondary | ICD-10-CM | POA: Diagnosis not present

## 2020-06-29 DIAGNOSIS — Z5181 Encounter for therapeutic drug level monitoring: Secondary | ICD-10-CM

## 2020-06-29 DIAGNOSIS — Z7901 Long term (current) use of anticoagulants: Secondary | ICD-10-CM | POA: Diagnosis not present

## 2020-06-29 LAB — POCT INR: INR: 2.8 (ref 2.0–3.0)

## 2020-06-29 NOTE — Patient Instructions (Signed)
Description   Continue on same dosage 1.5 tablets daily except 1 tablet on Sundays, Tuesdays and Thursdays.  Recheck in 6 weeks. Call us with any medication changes or concerns # [phone] [facility], Main # [phone].

## 2020-07-01 ENCOUNTER — Telehealth: Payer: Self-pay

## 2020-07-01 NOTE — Telephone Encounter (Signed)
Pt verbalized understanding if his renal ultrasound... 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 he will monitor and let us know and further discuss with his PCP... Dr. Lavone Neri... copy sent to her for her records. Pt to keep 08/2020 follow up.

## 2020-07-01 NOTE — Telephone Encounter (Signed)
-----   Message from Peter M Martinique, MD sent at 06/22/2020  9:08 AM EDT ----- This study demonstrates:  Renal dopplers show no evidence of renal artery stenosis. 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 I don't recall him having any abdominal complaints. 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 If he isn't having any of this then no further evaluation of this needed.  Please send results to the PCP:  Helane Rima, MD  Peter Martinique, MD 06/22/2020 9:05 AM

## 2020-07-31 NOTE — Progress Notes (Signed)
[name] Date of Birth: 1941/08/11   History of Present Illness: [name] is seen today for follow up CAD and atrial fibrillation/flutter.  He has a history of coronary disease with prior coronary bypass surgery in 2005. Last evaluation with cardiac catheterization in July of 2012 showed a patent LIMA graft to the LAD. The second marginal branch was occluded and the first marginal branch had a moderately severe stenosis. This is treated medically. His last Myoview study here in November 2013 showed a small apical fixed defect without ischemia. Ejection fraction 69%. He does have a history of paroxysmal atrial fibrillation and is on chronic Coumadin therapy.  In February 2015 he had atrial flutter that resolved with IV Cardizem.  Echo in March 2015 showed a normal study except for mild diastolic dysfunction. In June 2016 he was visiting family in [place] when he developed severe HTN with BP increased to 218/118. He was admitted to the hospital in [facility], [place] and had extensive work up there. Myoview study showed a fixed inferior defect without ischemia and normal EF. Echo was normal.    He is now followed by pulmonary for COPD and sleep apnea. He has not required CPAP yet. On his last visit he continued to have large spikes in his BP particularly when visiting family in [place]. RA dopplers were obtained and showed no significant stenosis. 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 Hydralazine 25 mg tid was added for BP control.   On follow up today he reports he is doing very well.  No chest pain or dyspnea. Uses albuterol about once a week. No palpitations. BP control has improved and he has no further spikes in BP even when visiting family in [place]. Tolerating medication well.   Allergies as of 08/05/2020      Reactions   Cephalexin Rash      Medication List       Accurate as of August 05, 2020 11:03 AM. If you have any questions, ask your nurse or doctor.          acetaminophen 500 MG tablet Commonly known as: TYLENOL Take 1,000 mg by mouth daily.   albuterol 108 (90 Base) MCG/ACT inhaler Commonly known as: VENTOLIN HFA INHALE TWO PUFFS INTO THE LUNGS EVERY 6 HOURS AS NEEDED FOR WHEEZING OR SHORTNESS OF BREATH   allopurinol 300 MG tablet Commonly known as: ZYLOPRIM TAKE ONE TABLET BY MOUTH DAILY   amLODipine 10 MG tablet Commonly known as: NORVASC TAKE ONE TABLET BY MOUTH DAILY   atorvastatin 40 MG tablet Commonly known as: LIPITOR Take 1 tablet (40 mg total) by mouth daily.   augmented betamethasone dipropionate 0.05 % cream Commonly known as: DIPROLENE-AF Use as directed   b complex vitamins tablet Take 1 tablet by mouth daily.   cloNIDine 0.2 MG tablet Commonly known as: CATAPRES TAKE ONE TABLET BY MOUTH TWICE A DAY   fish oil-omega-3 fatty acids 1000 MG capsule Take 1 g by mouth 2 (two) times daily.   furosemide 20 MG tablet Commonly known as: LASIX TAKE ONE TABLET BY MOUTH DAILY   GLIPIZIDE PO Take 5 mg by mouth daily.   GLUCOSAMINE CHOND MSM FORMULA PO Take 2 capsules by mouth daily.   hydrALAZINE 25 MG tablet Commonly known as: APRESOLINE Take 1 tablet (25 mg total) by mouth 3 (three) times daily.   isosorbide mononitrate 30 MG 24 hr tablet Commonly known as: IMDUR TAKE ONE TABLET BY MOUTH DAILY   lisinopril 40  MG tablet Commonly known as: ZESTRIL TAKE 1 TABLET BY MOUTH DAILY.   metFORMIN 500 MG 24 hr tablet Commonly known as: GLUCOPHAGE-XR Take 2 tablets by mouth 2 (two) times daily. Take 2 tabs twice daily   metoprolol succinate 100 MG 24 hr tablet Commonly known as: TOPROL-XL TAKE ONE TABLET BY MOUTH DAILY WITH OR IMMEDIATELY FOLLOWING A MEAL   multivitamin with minerals Tabs tablet Take 1 tablet by mouth daily.   nitroGLYCERIN 0.4 MG SL tablet Commonly known as: Nitrostat PLACE 1 TABLET (0.4 MG TOTAL) UNDER THE TONGUE EVERY 5  MINUTES AS NEEDED FOR CHEST PAIN.   ONE TOUCH ULTRA TEST test  strip Generic drug: glucose blood   onetouch ultrasoft lancets   vitamin C 500 MG tablet Commonly known as: ASCORBIC ACID Take 1,000 mg by mouth daily.   warfarin 5 MG tablet Commonly known as: COUMADIN Take as directed by the anticoagulation clinic. If you are unsure how to take this medication, talk to your nurse or doctor. Original instructions: TAKE ONE TO ONE AND A HALF TABLETS BY MOUTH DAILY AS DIRECTED BY THE COUMADIN CLINIC       Allergies  Allergen Reactions  . Cephalexin Rash    Past Medical History:  Diagnosis Date  . Chest pain July 2012   s/p repeat cath; stable anatomy. Managed medically  . Coronary artery disease   . Current use of long term anticoagulation   . Diabetes mellitus   . Dyslipidemia   . Edema   . Full dentures   . Gout   . History of acute myocardial infarction 11/2005   acute anteroseptal myocardial infarction  . Hyperlipidemia   . Hypertension   . Osteoarthritis   . Paroxysmal atrial fibrillation (HCC)   . Wears glasses     Past Surgical History:  Procedure Laterality Date  . CARDIAC CATHETERIZATION  05/18/2009  . CARDIAC CATHETERIZATION  July 2012   Stable anatomy. Manage medically  . COLONOSCOPY    . CORONARY ARTERY BYPASS GRAFT  2005  . DEBRIDEMENT AND CLOSURE WOUND Left 06/18/2014   Procedure: CLOSURE OF OPEN WOUND OF LEFT CHEEK/POSSIBLE CHEEK ADVANCEMENT/ROTATION;  Surgeon: Theodoro Kos, DO;  Location: [place];  Service: Plastics;  Laterality: Left;  . INCISION AND DRAINAGE OF WOUND Left 07/17/2014   Procedure: IRRIGATION AND DEBRIDEMENT LEFT CHEEK WOUND WITH PLACEMENT OF ACELL;  Surgeon: Theodoro Kos, DO;  Location: [place];  Service: Plastics;  Laterality: Left;  . TONSILLECTOMY      Social History   Tobacco Use  Smoking Status Former Smoker  . Packs/day: 3.00  . Years: 30.00  . Pack years: 90.00  . Types: Cigarettes  . Start date: 09/28/1962  . Quit date: 53  . Years since  quitting: 32.9  Smokeless Tobacco Never Used    Social History   Substance and Sexual Activity  Alcohol Use No    Family History  Problem Relation Age of Onset  . Coronary artery disease Mother   . Heart disease Mother   . Coronary artery disease Father   . Heart disease Father   . Cancer Brother   . Cancer Brother     Review of Systems: The review of systems is as above.  All other systems were reviewed and are negative.  Physical Exam: BP (!) 110/50 (BP Location: Left Arm, Patient Position: Sitting)   Pulse (!) 59   Ht 5\' 9"  (1.753 m)   Wt 197 lb 6.4 oz (89.5 kg)  SpO2 95%   BMI 29.15 kg/m  GENERAL:  Well appearing WM in NAD HEENT:  PERRL, EOMI, sclera are clear. Oropharynx is clear. NECK:  No jugular venous distention, carotid upstroke brisk and symmetric, no bruits, no thyromegaly or adenopathy LUNGS:  Clear to auscultation bilaterally CHEST:  Unremarkable HEART:  RRR,  PMI not displaced or sustained,S1 and S2 within normal limits, no S3, no S4: no clicks, no rubs, no murmurs ABD:  Soft, nontender. BS +, no masses or bruits. No hepatomegaly, no splenomegaly EXT:  2 + pulses throughout, 1+ edema- support hose in place, no cyanosis no clubbing SKIN:  Warm and dry.  No rashes NEURO:  Alert and oriented x 3. Cranial nerves II through XII intact. PSYCH:  Cognitively intact  LABORATORY DATA:    Lab Results  Component Value Date   WBC 8.7 10/28/2014   HGB 14.9 10/28/2014   HCT 44.0 10/28/2014   PLT 220 10/28/2014   GLUCOSE 128 (H) 10/28/2014   CHOL 135 04/02/2012   TRIG 130.0 04/02/2012   HDL 41.30 04/02/2012   LDLCALC 68 04/02/2012   ALT 28 03/18/2011   AST 27 03/18/2011   NA 136 10/28/2014   K 3.7 10/28/2014   CL 102 10/28/2014   CREATININE 0.95 10/28/2014   BUN 16 10/28/2014   CO2 26 10/28/2014   TSH 0.882 03/19/2011   INR 2.8 06/29/2020   HGBA1C (H) 05/16/2009    6.3 (NOTE) The ADA recommends the following therapeutic goal for glycemic control  related to Hgb A1c measurement: Goal of therapy: <6.5 Hgb A1c  Reference: American Diabetes Association: Clinical Practice Recommendations 2010, Diabetes Care, 2010, 33: (Suppl  1).     Labs dated 05/31/16: glucose 126. CMET normal. A1c 6.1%.  Dated 12/01/15: cholesterol 143, triglycerides 95, LDL 72, HDL 52. Dated 11/28/16: A1c 6.9%. Glucose 156, otherwise CMET normal. Cholesterol 142, triglycerides 207, HDL 40, LDL 61 Dated 06/01/17: A1c 7%. Glucose 141, cholesterol 120, triglycerides 151, HDL 41, LDL 49. CMET normal.  Dated 11/30/17: A1c 7.1%. Glucose 137. Other chemistries normal.  Dated 10/05/18: glucose 152, A1c 7.2%. otherwise CMET and CBC normal. Cholesterol 126, triglycerides 174, HDL 37, LDL 54.  Dated 04/05/19: glucose 204, A1c  7.3%. otherwise CMET normal Dated 10/23/19: cholesterol 120, triglycerides 198, HDL 35, LDL 53. A1c 7.4%. glucose 217 otherwise CMET normal. CBC normal Dated 04/24/20: A1c 7.2%. Glucose 140, otherwise CMET normal.     Assessment / Plan:  1. Coronary disease status post CABG in 2005. Vein graft to the OM is known to be occluded. He is asymptomatic on medical therapy. Last Myoview in 2018 without ischemia and normal EF.  He will continue with his medical therapy.  2. Atrial fibrillation/flutter- paroxysmal. He is on chronic Coumadin therapy. In sinus rhythm at this time. Continue current therapy  3. Hyperlipidemia. On statin therapy and fish oil. Excellent control of LDL. Mildly elevated triglycerides. Focus on weight loss and diabetic control.   4. HTN.  BP is much improved on hydralazine.  Instructed to take additional if BP is over 170. We discussed lifestyle modification. Really needs to increase aerobic activity.  5. 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 Last A1c 7.4%. encourage weight loss.   6. Edema. Chronic.  Continue lasix, elevation, support hose, and sodium restriction  8. OSA- followed by pulmonary - has not required CPAP yet.   9. COPD followed by  pulmonary.  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 Will monitor.   I will follow up in  6 months.

## 2020-08-05 ENCOUNTER — Encounter: Payer: Self-pay | Admitting: Cardiology

## 2020-08-05 ENCOUNTER — Other Ambulatory Visit: Payer: Self-pay | Admitting: Cardiology

## 2020-08-05 ENCOUNTER — Ambulatory Visit (INDEPENDENT_AMBULATORY_CARE_PROVIDER_SITE_OTHER): Payer: Medicare Other | Admitting: Cardiology

## 2020-08-05 ENCOUNTER — Other Ambulatory Visit: Payer: Self-pay

## 2020-08-05 VITALS — BP 110/50 | HR 59 | Ht 69.0 in | Wt 197.4 lb

## 2020-08-05 DIAGNOSIS — I480 Paroxysmal atrial fibrillation: Secondary | ICD-10-CM | POA: Diagnosis not present

## 2020-08-05 DIAGNOSIS — I10 Essential (primary) hypertension: Secondary | ICD-10-CM | POA: Diagnosis not present

## 2020-08-05 DIAGNOSIS — I2510 Atherosclerotic heart disease of native coronary artery without angina pectoris: Secondary | ICD-10-CM

## 2020-08-05 DIAGNOSIS — E782 Mixed hyperlipidemia: Secondary | ICD-10-CM | POA: Diagnosis not present

## 2020-08-05 DIAGNOSIS — I25708 Atherosclerosis of coronary artery bypass graft(s), unspecified, with other forms of angina pectoris: Secondary | ICD-10-CM | POA: Diagnosis not present

## 2020-08-06 DIAGNOSIS — E1140 Type 2 diabetes mellitus with diabetic neuropathy, unspecified: Secondary | ICD-10-CM | POA: Insufficient documentation

## 2020-08-10 ENCOUNTER — Ambulatory Visit (INDEPENDENT_AMBULATORY_CARE_PROVIDER_SITE_OTHER): Payer: Medicare Other

## 2020-08-10 ENCOUNTER — Other Ambulatory Visit: Payer: Self-pay

## 2020-08-10 DIAGNOSIS — Z7901 Long term (current) use of anticoagulants: Secondary | ICD-10-CM

## 2020-08-10 DIAGNOSIS — I4891 Unspecified atrial fibrillation: Secondary | ICD-10-CM

## 2020-08-10 DIAGNOSIS — Z5181 Encounter for therapeutic drug level monitoring: Secondary | ICD-10-CM

## 2020-08-10 LAB — POCT INR: INR: 2 (ref 2.0–3.0)

## 2020-08-10 NOTE — Patient Instructions (Signed)
Description   Take 2 tablets today, then resume same dosage 1.5 tablets daily except 1 tablet on Sundays, Tuesdays and Thursdays.  Recheck in 6 weeks. Call us with any medication changes or concerns # [phone] [facility], Main # [phone].

## 2020-08-12 ENCOUNTER — Other Ambulatory Visit: Payer: Self-pay | Admitting: Cardiology

## 2020-08-19 ENCOUNTER — Other Ambulatory Visit: Payer: Self-pay | Admitting: Cardiology

## 2020-08-19 DIAGNOSIS — I2510 Atherosclerotic heart disease of native coronary artery without angina pectoris: Secondary | ICD-10-CM

## 2020-08-19 DIAGNOSIS — Z7901 Long term (current) use of anticoagulants: Secondary | ICD-10-CM

## 2020-08-19 DIAGNOSIS — I10 Essential (primary) hypertension: Secondary | ICD-10-CM

## 2020-08-19 DIAGNOSIS — I480 Paroxysmal atrial fibrillation: Secondary | ICD-10-CM

## 2020-08-19 DIAGNOSIS — E785 Hyperlipidemia, unspecified: Secondary | ICD-10-CM

## 2020-08-19 NOTE — Telephone Encounter (Signed)
Rx has been sent to the pharmacy electronically. ° °

## 2020-08-24 ENCOUNTER — Other Ambulatory Visit: Payer: Self-pay | Admitting: Cardiology

## 2020-08-24 DIAGNOSIS — I480 Paroxysmal atrial fibrillation: Secondary | ICD-10-CM

## 2020-08-24 DIAGNOSIS — I10 Essential (primary) hypertension: Secondary | ICD-10-CM

## 2020-08-24 DIAGNOSIS — Z7901 Long term (current) use of anticoagulants: Secondary | ICD-10-CM

## 2020-08-24 DIAGNOSIS — I2510 Atherosclerotic heart disease of native coronary artery without angina pectoris: Secondary | ICD-10-CM

## 2020-08-24 DIAGNOSIS — E785 Hyperlipidemia, unspecified: Secondary | ICD-10-CM

## 2020-08-29 ENCOUNTER — Other Ambulatory Visit: Payer: Self-pay | Admitting: Cardiology

## 2020-08-29 DIAGNOSIS — E785 Hyperlipidemia, unspecified: Secondary | ICD-10-CM

## 2020-08-29 DIAGNOSIS — I480 Paroxysmal atrial fibrillation: Secondary | ICD-10-CM

## 2020-08-29 DIAGNOSIS — Z7901 Long term (current) use of anticoagulants: Secondary | ICD-10-CM

## 2020-08-29 DIAGNOSIS — I10 Essential (primary) hypertension: Secondary | ICD-10-CM

## 2020-08-29 DIAGNOSIS — I2510 Atherosclerotic heart disease of native coronary artery without angina pectoris: Secondary | ICD-10-CM

## 2020-09-24 ENCOUNTER — Other Ambulatory Visit: Payer: Self-pay

## 2020-09-24 ENCOUNTER — Ambulatory Visit (INDEPENDENT_AMBULATORY_CARE_PROVIDER_SITE_OTHER): Payer: Medicare Other | Admitting: *Deleted

## 2020-09-24 DIAGNOSIS — Z7901 Long term (current) use of anticoagulants: Secondary | ICD-10-CM

## 2020-09-24 DIAGNOSIS — I4891 Unspecified atrial fibrillation: Secondary | ICD-10-CM

## 2020-09-24 DIAGNOSIS — Z5181 Encounter for therapeutic drug level monitoring: Secondary | ICD-10-CM

## 2020-09-24 LAB — POCT INR: INR: 2.2 (ref 2.0–3.0)

## 2020-09-24 NOTE — Patient Instructions (Signed)
Description   Continue taking Warfarin 1.5 tablets daily except 1 tablet on Sundays, Tuesdays and Thursdays.  Recheck in 6 weeks. Call us with any medication changes or concerns # [phone] [facility], Main # [phone].

## 2020-11-09 ENCOUNTER — Ambulatory Visit (INDEPENDENT_AMBULATORY_CARE_PROVIDER_SITE_OTHER): Payer: Medicare Other | Admitting: *Deleted

## 2020-11-09 ENCOUNTER — Other Ambulatory Visit: Payer: Self-pay

## 2020-11-09 ENCOUNTER — Encounter (INDEPENDENT_AMBULATORY_CARE_PROVIDER_SITE_OTHER): Payer: Self-pay

## 2020-11-09 DIAGNOSIS — I4891 Unspecified atrial fibrillation: Secondary | ICD-10-CM | POA: Diagnosis not present

## 2020-11-09 DIAGNOSIS — Z5181 Encounter for therapeutic drug level monitoring: Secondary | ICD-10-CM

## 2020-11-09 DIAGNOSIS — Z7901 Long term (current) use of anticoagulants: Secondary | ICD-10-CM | POA: Diagnosis not present

## 2020-11-09 LAB — POCT INR: INR: 2 (ref 2.0–3.0)

## 2020-11-09 NOTE — Patient Instructions (Signed)
Description   Continue taking Warfarin 1.5 tablets daily except 1 tablet on Sundays, Tuesdays and Thursdays.  Recheck in 6 weeks. Call us with any medication changes or concerns # [phone] [facility], Main # [phone].

## 2020-11-16 ENCOUNTER — Telehealth: Payer: Self-pay

## 2020-11-16 NOTE — Telephone Encounter (Signed)
Left voicemail for patient to bring SD card from CPAP machine to visit tomorrow. Nothing further needed.  

## 2020-11-17 ENCOUNTER — Encounter: Payer: Self-pay | Admitting: Pulmonary Disease

## 2020-11-17 ENCOUNTER — Other Ambulatory Visit: Payer: Self-pay

## 2020-11-17 ENCOUNTER — Ambulatory Visit (INDEPENDENT_AMBULATORY_CARE_PROVIDER_SITE_OTHER): Payer: Medicare Other | Admitting: Pulmonary Disease

## 2020-11-17 VITALS — BP 118/62 | HR 59 | Temp 97.9°F | Ht 69.0 in | Wt 198.4 lb

## 2020-11-17 DIAGNOSIS — G4733 Obstructive sleep apnea (adult) (pediatric): Secondary | ICD-10-CM | POA: Diagnosis not present

## 2020-11-17 MED ORDER — ALBUTEROL SULFATE HFA 108 (90 BASE) MCG/ACT IN AERS: 2.0000 | INHALATION_SPRAY | Freq: Four times a day (QID) | RESPIRATORY_TRACT | 5 refills | Status: DC | PRN

## 2020-11-17 NOTE — Progress Notes (Signed)
[name]    [id number]    May 21, 1941  Primary Care Physician:Manfredi, Lesle Chris, MD  Referring Physician: Helane Rima, MD [address] [place],  [place] [address]  Chief complaint:   Initially seen for abnormal breathing when he is sleeping, shortness of breath Symptoms seem better In for follow-up  HPI:  Diagnosed with mild obstructive sleep apnea -Elected not to start CPAP and focus on weight loss efforts Has managed to lose about 10 pounds since his last visit  He feels his sleeping is better  He was on Anoro for shortness of breath but has been off Anoro and doing well with albuterol Rarely uses albuterol at present  He was recently followed up by cardiology-doing well He has no new symptoms Breathing feels good  Mild obstructive sleep apnea Abnormal PFT showing moderate obstructive lung disease  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 He is very active usually He gets short of breath walking up a flight of steps Gets short of breath with significant exertion  Denies a chronic cough Has no fevers or chills   Occupation: Trucking Smoking history: Reformed smoker  Outpatient Encounter Medications as of 11/17/2020  Medication Sig  . acetaminophen (TYLENOL) 500 MG tablet Take 1,000 mg by mouth daily.  Marland Kitchen allopurinol (ZYLOPRIM) 300 MG tablet TAKE ONE TABLET BY MOUTH DAILY  . amLODipine (NORVASC) 10 MG tablet TAKE ONE TABLET BY MOUTH DAILY  . Ascorbic Acid (VITAMIN C) 500 MG tablet Take 1,000 mg by mouth daily.  Marland Kitchen atorvastatin (LIPITOR) 40 MG tablet Take 1 tablet (40 mg total) by mouth daily.  Marland Kitchen augmented betamethasone dipropionate (DIPROLENE-AF) 0.05 % cream Use as directed  . b complex vitamins tablet Take 1 tablet by mouth daily.   . cloNIDine (CATAPRES) 0.2 MG tablet TAKE ONE TABLET BY MOUTH TWICE A DAY  . fish oil-omega-3 fatty acids 1000 MG capsule Take 1 g by mouth 2 (two) times daily.  . furosemide (LASIX) 20 MG tablet TAKE ONE TABLET BY MOUTH DAILY  . GLIPIZIDE PO Take 5 mg by mouth daily.   . hydrALAZINE (APRESOLINE) 25 MG tablet Take 1 tablet (25 mg total) by mouth 3 (three) times daily.  . isosorbide mononitrate (IMDUR) 30 MG 24 hr tablet TAKE ONE TABLET BY MOUTH DAILY  . Lancets (ONETOUCH ULTRASOFT) lancets   . lisinopril (ZESTRIL) 40 MG tablet TAKE ONE TABLET BY MOUTH DAILY  . metFORMIN (GLUCOPHAGE-XR) 500 MG 24 hr tablet Take 2 tablets by mouth 2 (two) times daily. Take 2 tabs twice daily  . metoprolol succinate (TOPROL-XL) 100 MG 24 hr tablet TAKE ONE TABLET BY MOUTH DAILY WITH OR IMMEDIATELY FOLLOWING A MEAL  . Misc Natural Products (GLUCOSAMINE CHOND MSM FORMULA PO) Take 2 capsules by mouth daily.  . Multiple Vitamin (MULTIVITAMIN WITH MINERALS) TABS tablet Take 1 tablet by mouth daily.  . nitroGLYCERIN (NITROSTAT) 0.4 MG SL tablet PLACE 1 TABLET (0.4 MG TOTAL) UNDER THE TONGUE EVERY 5  MINUTES AS NEEDED FOR CHEST PAIN.  . ONE TOUCH ULTRA TEST test strip   . warfarin (COUMADIN) 5 MG tablet TAKE ONE TO ONE AND A HALF TABLETS BY MOUTH DAILY AS DIRECTED BY THE COUMADIN CLINIC  . [  DISCONTINUED] albuterol (VENTOLIN HFA) 108 (90 Base) MCG/ACT inhaler INHALE TWO PUFFS INTO THE LUNGS EVERY 6 HOURS AS NEEDED FOR WHEEZING OR SHORTNESS OF BREATH  . albuterol (VENTOLIN HFA) 108 (90 Base) MCG/ACT inhaler Inhale 2 puffs into the lungs every 6 (six) hours as needed for wheezing or shortness of breath.   No facility-administered encounter medications on file as of 11/17/2020.    Allergies as of 11/17/2020 - Review Complete 11/17/2020  Allergen Reaction Noted  . Cephalexin Rash 12/20/2010    Past Medical History:  Diagnosis Date  . Chest pain July 2012   s/p repeat cath; stable anatomy. Managed medically  .  Coronary artery disease   . Current use of long term anticoagulation   . Diabetes mellitus   . Dyslipidemia   . Edema   . Full dentures   . Gout   . History of acute myocardial infarction 11/2005   acute anteroseptal myocardial infarction  . Hyperlipidemia   . Hypertension   . Osteoarthritis   . Paroxysmal atrial fibrillation (HCC)   . Wears glasses     Past Surgical History:  Procedure Laterality Date  . CARDIAC CATHETERIZATION  05/18/2009  . CARDIAC CATHETERIZATION  July 2012   Stable anatomy. Manage medically  . COLONOSCOPY    . CORONARY ARTERY BYPASS GRAFT  2005  . DEBRIDEMENT AND CLOSURE WOUND Left 06/18/2014   Procedure: CLOSURE OF OPEN WOUND OF LEFT CHEEK/POSSIBLE CHEEK ADVANCEMENT/ROTATION;  Surgeon: Theodoro Kos, DO;  Location: [facility];  Service: Plastics;  Laterality: Left;  . INCISION AND DRAINAGE OF WOUND Left 07/17/2014   Procedure: IRRIGATION AND DEBRIDEMENT LEFT CHEEK WOUND WITH PLACEMENT OF ACELL;  Surgeon: Theodoro Kos, DO;  Location: [facility];  Service: Plastics;  Laterality: Left;  . TONSILLECTOMY      Family History  Problem Relation Age of Onset  . Coronary artery disease Mother   . Heart disease Mother   . Coronary artery disease Father   . Heart disease Father   . Cancer Brother   . Cancer Brother     Social History   Socioeconomic History  . Marital status: Single    Spouse name: Not on file  . Number of children: Not on file  . Years of education: Not on file  . Highest education level: Not on file  Occupational History  . Not on file  Tobacco Use  . Smoking status: Former Smoker    Packs/day: 3.00    Years: 30.00    Pack years: 90.00    Types: Cigarettes    Start date: 09/28/1962    Quit date: 1989    Years since quitting: 33.2  . Smokeless tobacco: Never Used  Substance and Sexual Activity  . Alcohol use: No  . Drug use: No  . Sexual activity: Never  Other Topics Concern  . Not on file   Social History Narrative  . Not on file   Social Determinants of Health   Financial Resource Strain: Not on file  Food Insecurity: Not on file  Transportation Needs: Not on file  Physical Activity: Not on file  Stress: Not on file  Social Connections: Not on file  Intimate Partner Violence: Not on file    Review of Systems  Constitutional: Negative for fatigue.  HENT: Negative.   Eyes: Negative.   Respiratory: Positive for apnea. Negative for cough, shortness of breath and wheezing.   Cardiovascular: Negative.  Negative for chest pain and leg swelling.  Gastrointestinal: Negative.  Endocrine: Negative.   Musculoskeletal: Negative.   Skin: Negative.   Psychiatric/Behavioral: Negative for sleep disturbance.  All other systems reviewed and are negative.  Vitals:   11/17/20 1156  BP: 118/62  Pulse: (!) 59  Temp: 97.9 F (36.6 C)  SpO2: 96%   Physical Exam Constitutional:      General: He is not in acute distress.    Appearance: Normal appearance. He is well-developed. He is obese. He is not diaphoretic.     Comments: Overweight  HENT:     Head: Normocephalic and atraumatic.  Eyes:     Conjunctiva/sclera: Conjunctivae normal.  Neck:     Thyroid: No thyromegaly.  Cardiovascular:     Rate and Rhythm: Normal rate and regular rhythm.     Heart sounds: Normal heart sounds.  Pulmonary:     Effort: Pulmonary effort is normal. No respiratory distress.     Breath sounds: Normal breath sounds. No wheezing or rales.  Musculoskeletal:     Cervical back: No rigidity or tenderness.  Neurological:     Mental Status: He is alert.  Psychiatric:        Mood and Affect: Mood normal.    Data Reviewed: Recent sleep study reviewed showing mild obstructive sleep apnea with mild oxygen desaturations  Echocardiogram from 2015 revealed ejection fraction of 60 to 93%, diastolic dysfunction  Pulmonary function study does reveal moderate obstructive disease Results of the [facility]  flowsheet 07/15/2019 09/12/2018  Sitting and reading 2 0  Watching TV 2 2  Sitting, inactive in a public place (e.g. a theatre or a meeting) 0 0  As a passenger in a car for an hour without a break 1 0  Lying down to rest in the afternoon when circumstances permit 3 3  Sitting and talking to someone 0 0  Sitting quietly after a lunch without alcohol 2 2  In a car, while stopped for a few minutes in traffic 0 0  Total score 10 7   Assessment:   Shortness of breath -Feels a lot better -Rarely using albuterol  Mild obstructive sleep apnea -Wants to continue with symptom monitoring -Encouraged to continue to focus on weight loss efforts  Obesity Significant smoking history Significant history of heart disease, diastolic dysfunction, atrial fibrillation in the past Has no significant symptoms of shortness of breath at rest  Plan/Recommendations: .  Albuterol as needed  .  Sleep position modification and tenderness  .  Continue diet and exercise on a regular basis  .  I will see him in about a year  .  Encouraged to call with any significant concerns   Sherrilyn Rist MD [facility] 11/17/2020, 12:02 PM  CC: Helane Rima, MD

## 2020-11-17 NOTE — Patient Instructions (Signed)
Continue weight loss efforts  Continue regular exercise and diet  Call us with any significant concerns  Continue albuterol as needed  I will see you a year from now

## 2020-12-21 ENCOUNTER — Ambulatory Visit (INDEPENDENT_AMBULATORY_CARE_PROVIDER_SITE_OTHER): Payer: Medicare Other | Admitting: *Deleted

## 2020-12-21 ENCOUNTER — Other Ambulatory Visit: Payer: Self-pay

## 2020-12-21 DIAGNOSIS — I4891 Unspecified atrial fibrillation: Secondary | ICD-10-CM | POA: Diagnosis not present

## 2020-12-21 DIAGNOSIS — Z7901 Long term (current) use of anticoagulants: Secondary | ICD-10-CM | POA: Diagnosis not present

## 2020-12-21 DIAGNOSIS — Z5181 Encounter for therapeutic drug level monitoring: Secondary | ICD-10-CM

## 2020-12-21 LAB — POCT INR: INR: 2.6 (ref 2.0–3.0)

## 2020-12-21 NOTE — Patient Instructions (Signed)
Description   Continue taking Warfarin 1.5 tablets daily except 1 tablet on Sundays, Tuesdays and Thursdays.  Recheck in 7 weeks. Call us with any medication changes or concerns # [phone] [facility] # [phone].

## 2021-01-23 ENCOUNTER — Other Ambulatory Visit: Payer: Self-pay | Admitting: Cardiology

## 2021-01-23 DIAGNOSIS — I10 Essential (primary) hypertension: Secondary | ICD-10-CM

## 2021-01-23 DIAGNOSIS — Z7901 Long term (current) use of anticoagulants: Secondary | ICD-10-CM

## 2021-01-23 DIAGNOSIS — I480 Paroxysmal atrial fibrillation: Secondary | ICD-10-CM

## 2021-01-23 DIAGNOSIS — I2510 Atherosclerotic heart disease of native coronary artery without angina pectoris: Secondary | ICD-10-CM

## 2021-01-23 DIAGNOSIS — E785 Hyperlipidemia, unspecified: Secondary | ICD-10-CM

## 2021-01-25 NOTE — Telephone Encounter (Addendum)
Prescription refill request received for warfarin [place]: [place] 08/05/2020 Next INR check: 6/6 Warfarin tablet strength: 5mg 

## 2021-02-08 ENCOUNTER — Other Ambulatory Visit: Payer: Self-pay

## 2021-02-08 ENCOUNTER — Ambulatory Visit (INDEPENDENT_AMBULATORY_CARE_PROVIDER_SITE_OTHER): Payer: Medicare Other | Admitting: *Deleted

## 2021-02-08 DIAGNOSIS — I4891 Unspecified atrial fibrillation: Secondary | ICD-10-CM | POA: Diagnosis not present

## 2021-02-08 DIAGNOSIS — Z7901 Long term (current) use of anticoagulants: Secondary | ICD-10-CM | POA: Diagnosis not present

## 2021-02-08 DIAGNOSIS — Z5181 Encounter for therapeutic drug level monitoring: Secondary | ICD-10-CM

## 2021-02-08 LAB — POCT INR: INR: 2.4 (ref 2.0–3.0)

## 2021-02-08 NOTE — Patient Instructions (Signed)
Description   Continue taking Warfarin 1.5 tablets daily except 1 tablet on Sundays, Tuesdays and Thursdays.  Recheck in 7 weeks. Call us with any medication changes or concerns # [phone] [facility] Main # [phone].

## 2021-03-05 NOTE — Progress Notes (Signed)
[name] Date of Birth: 1940-09-21   History of Present Illness: [name] is seen today for follow up CAD and atrial fibrillation/flutter.  He has a history of coronary disease with prior coronary bypass surgery in 2005. Last evaluation with cardiac catheterization in July of 2012 showed a patent LIMA graft to the LAD. The second marginal branch was occluded and the first marginal branch had a moderately severe stenosis. This is treated medically. His last Myoview study here in November 2013 showed a small apical fixed defect without ischemia. Ejection fraction 69%. He does have a history of paroxysmal atrial fibrillation and is on chronic Coumadin therapy.  In February 2015 he had atrial flutter that resolved with IV Cardizem.  Echo in March 2015 showed a normal study except for mild diastolic dysfunction. In June 2016 he was visiting family in [place] when he developed severe HTN with BP increased to 218/118. He was admitted to the hospital in [facility], [place] and had extensive work up there. Myoview study showed a fixed inferior defect without ischemia and normal EF. Echo was normal.    He is now followed by pulmonary for COPD and sleep apnea. He has not required CPAP yet. On his last visit he continued to have large spikes in his BP particularly when visiting family in [place]. RA dopplers were obtained and showed no significant stenosis. 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 Hydralazine 25 mg tid was added for BP control.   On follow up today he reports he is doing very well.  No chest pain or dyspnea. No palpitations. BP control has improved and he has no further spikes in BP even when travelling. Tolerating medication well. Stays active with fishing, walking, and yard work. He has lost 4 lbs.   Allergies as of 03/10/2021       Reactions   Cephalexin Rash        Medication List        Accurate as of March 10, 2021  8:07 AM. If you have any questions, ask your nurse or  doctor.          acetaminophen 500 MG tablet Commonly known as: TYLENOL Take 1,000 mg by mouth daily.   albuterol 108 (90 Base) MCG/ACT inhaler Commonly known as: VENTOLIN HFA Inhale 2 puffs into the lungs every 6 (six) hours as needed for wheezing or shortness of breath.   allopurinol 300 MG tablet Commonly known as: ZYLOPRIM TAKE ONE TABLET BY MOUTH DAILY   amLODipine 10 MG tablet Commonly known as: NORVASC TAKE ONE TABLET BY MOUTH DAILY   atorvastatin 40 MG tablet Commonly known as: LIPITOR TAKE ONE TABLET BY MOUTH DAILY   augmented betamethasone dipropionate 0.05 % cream Commonly known as: DIPROLENE-AF Use as directed   b complex vitamins tablet Take 1 tablet by mouth daily.   cloNIDine 0.2 MG tablet Commonly known as: CATAPRES TAKE ONE TABLET BY MOUTH TWICE A DAY   fish oil-omega-3 fatty acids 1000 MG capsule Take 1 g by mouth 2 (two) times daily.   furosemide 20 MG tablet Commonly known as: LASIX TAKE ONE TABLET BY MOUTH DAILY   GLIPIZIDE PO Take 5 mg by mouth daily.   GLUCOSAMINE CHOND MSM FORMULA PO Take 2 capsules by mouth daily.   hydrALAZINE 25 MG tablet Commonly known as: APRESOLINE Take 1 tablet (25 mg total) by mouth 3 (three) times daily.   isosorbide mononitrate 30 MG 24 hr tablet Commonly known as: IMDUR TAKE ONE  TABLET BY MOUTH DAILY   lisinopril 40 MG tablet Commonly known as: ZESTRIL TAKE ONE TABLET BY MOUTH DAILY   metFORMIN 500 MG 24 hr tablet Commonly known as: GLUCOPHAGE-XR Take 2 tablets by mouth 2 (two) times daily. Take 2 tabs twice daily   metoprolol succinate 100 MG 24 hr tablet Commonly known as: TOPROL-XL TAKE ONE TABLET BY MOUTH DAILY WITH OR IMMEDIATELY FOLLOWING A MEAL; **MUST CALL MD FOR APPOINTMENT   multivitamin with minerals Tabs tablet Take 1 tablet by mouth daily.   nitroGLYCERIN 0.4 MG SL tablet Commonly known as: Nitrostat PLACE 1 TABLET (0.4 MG TOTAL) UNDER THE TONGUE EVERY 5  MINUTES AS NEEDED FOR  CHEST PAIN.   ONE TOUCH ULTRA TEST test strip Generic drug: glucose blood   onetouch ultrasoft lancets   vitamin C 500 MG tablet Commonly known as: ASCORBIC ACID Take 1,000 mg by mouth daily.   warfarin 5 MG tablet Commonly known as: COUMADIN Take as directed by the anticoagulation clinic. If you are unsure how to take this medication, talk to your nurse or doctor. Original instructions: TAKE ONE TO ONE AND A HALF TABLETS BY MOUTH DAILY AS DIRECTED BY COUMADIN CLINIC        Allergies  Allergen Reactions   Cephalexin Rash    Past Medical History:  Diagnosis Date   Chest pain July 2012   s/p repeat cath; stable anatomy. Managed medically   Coronary artery disease    Current use of long term anticoagulation    Diabetes mellitus    Dyslipidemia    Edema    Full dentures    Gout    History of acute myocardial infarction 11/2005   acute anteroseptal myocardial infarction   Hyperlipidemia    Hypertension    Osteoarthritis    Paroxysmal atrial fibrillation [facility])    Wears glasses     Past Surgical History:  Procedure Laterality Date   CARDIAC CATHETERIZATION  05/18/2009   CARDIAC CATHETERIZATION  July 2012   Stable anatomy. Manage medically   COLONOSCOPY     CORONARY ARTERY BYPASS GRAFT  2005   DEBRIDEMENT AND CLOSURE WOUND Left 06/18/2014   Procedure: CLOSURE OF OPEN WOUND OF LEFT CHEEK/POSSIBLE CHEEK ADVANCEMENT/ROTATION;  Surgeon: Theodoro Kos, DO;  Location: [facility];  Service: Plastics;  Laterality: Left;   INCISION AND DRAINAGE OF WOUND Left 07/17/2014   Procedure: IRRIGATION AND DEBRIDEMENT LEFT CHEEK WOUND WITH PLACEMENT OF ACELL;  Surgeon: Theodoro Kos, DO;  Location: [facility];  Service: Plastics;  Laterality: Left;   TONSILLECTOMY      Social History   Tobacco Use  Smoking Status Former   Packs/day: 3.00   Years: 30.00   Pack years: 90.00   Types: Cigarettes   Start date: 09/28/1962   Quit date: 1989   Years since  quitting: 33.5  Smokeless Tobacco Never    Social History   Substance and Sexual Activity  Alcohol Use No    Family History  Problem Relation Age of Onset   Coronary artery disease Mother    Heart disease Mother    Coronary artery disease Father    Heart disease Father    Cancer Brother    Cancer Brother     Review of Systems: The review of systems is as above.  All other systems were reviewed and are negative.  Physical Exam: BP (!) 122/58   Pulse 81   Ht 5\' 9"  (1.753 m)   Wt 194 lb 6.4 oz (  88.2 kg)   SpO2 97%   BMI 28.71 kg/m  GENERAL:  Well appearing WM in NAD HEENT:  PERRL, EOMI, sclera are clear. Oropharynx is clear. NECK:  No jugular venous distention, carotid upstroke brisk and symmetric, no bruits, no thyromegaly or adenopathy LUNGS:  Clear to auscultation bilaterally CHEST:  Unremarkable HEART:  IRRR,  PMI not displaced or sustained,S1 and S2 within normal limits, no S3, no S4: no clicks, no rubs, no murmurs ABD:  Soft, nontender. BS +, no masses or bruits. No hepatomegaly, no splenomegaly EXT:  2 + pulses throughout, tr edema- support hose in place, no cyanosis no clubbing SKIN:  Warm and dry.  No rashes NEURO:  Alert and oriented x 3. Cranial nerves II through XII intact. PSYCH:  Cognitively intact  LABORATORY DATA:    Lab Results  Component Value Date   WBC 8.7 10/28/2014   HGB 14.9 10/28/2014   HCT 44.0 10/28/2014   PLT 220 10/28/2014   GLUCOSE 128 (H) 10/28/2014   CHOL 135 04/02/2012   TRIG 130.0 04/02/2012   HDL 41.30 04/02/2012   LDLCALC 68 04/02/2012   ALT 28 03/18/2011   AST 27 03/18/2011   NA 136 10/28/2014   K 3.7 10/28/2014   CL 102 10/28/2014   CREATININE 0.95 10/28/2014   BUN 16 10/28/2014   CO2 26 10/28/2014   TSH 0.882 03/19/2011   INR 2.4 02/08/2021   HGBA1C (H) 05/16/2009    6.3 (NOTE) The ADA recommends the following therapeutic goal for glycemic control related to Hgb A1c measurement: Goal of therapy: <6.5 Hgb A1c   Reference: American Diabetes Association: Clinical Practice Recommendations 2010, Diabetes Care, 2010, 33: (Suppl  1).     Labs dated 05/31/16: glucose 126. CMET normal. A1c 6.1%.  Dated 12/01/15: cholesterol 143, triglycerides 95, LDL 72, HDL 52. Dated 11/28/16: A1c 6.9%. Glucose 156, otherwise CMET normal. Cholesterol 142, triglycerides 207, HDL 40, LDL 61 Dated 06/01/17: A1c 7%. Glucose 141, cholesterol 120, triglycerides 151, HDL 41, LDL 49. CMET normal.  Dated 11/30/17: A1c 7.1%. Glucose 137. Other chemistries normal.  Dated 10/05/18: glucose 152, A1c 7.2%. otherwise CMET and CBC normal. Cholesterol 126, triglycerides 174, HDL 37, LDL 54.  Dated 04/05/19: glucose 204, A1c  7.3%. otherwise CMET normal Dated 10/23/19: cholesterol 120, triglycerides 198, HDL 35, LDL 53. A1c 7.4%. glucose 217 otherwise CMET normal. CBC normal Dated 04/24/20: A1c 7.2%. Glucose 140, otherwise CMET normal.  Dated 11/06/20: cholesterol 138, triglycerides 209, HDL 38, LDL 67. A1c 6.3%. CMET normal.  Ecg today shows Afib with rate 81 bpm. Otherwise normal. I have personally reviewed and interpreted this study.   Assessment / Plan:  1. Coronary disease status post CABG in 2005. Vein graft to the OM is known to be occluded. He is asymptomatic on medical therapy. Last Myoview in 2018 without ischemia and normal EF.  He will continue with his medical therapy.  2. Atrial fibrillation/flutter- paroxysmal. He is on chronic Coumadin therapy. In Afib today with controlled rate. He is asymptomatic.  Continue current therapy  3. Hyperlipidemia. On statin therapy and fish oil. Excellent control of LDL. Mildly elevated triglycerides. Encouraged weight loss.   4. HTN.  BP is well controlled now.  5. 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 Last A1c down to 6.3%. encourage weight loss.   6. Edema. Chronic.  Continue lasix, elevation, support hose, and sodium restriction. Improved on exam today  8. OSA- followed by pulmonary - has not required CPAP  yet.   9. COPD  followed by pulmonary.  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 Will monitor.   I will follow up in 6 months.

## 2021-03-10 ENCOUNTER — Encounter: Payer: Self-pay | Admitting: Cardiology

## 2021-03-10 ENCOUNTER — Other Ambulatory Visit: Payer: Self-pay

## 2021-03-10 ENCOUNTER — Ambulatory Visit (INDEPENDENT_AMBULATORY_CARE_PROVIDER_SITE_OTHER): Payer: Medicare Other | Admitting: Cardiology

## 2021-03-10 VITALS — BP 122/58 | HR 81 | Ht 69.0 in | Wt 194.4 lb

## 2021-03-10 DIAGNOSIS — E782 Mixed hyperlipidemia: Secondary | ICD-10-CM

## 2021-03-10 DIAGNOSIS — I2510 Atherosclerotic heart disease of native coronary artery without angina pectoris: Secondary | ICD-10-CM

## 2021-03-10 DIAGNOSIS — I25708 Atherosclerosis of coronary artery bypass graft(s), unspecified, with other forms of angina pectoris: Secondary | ICD-10-CM

## 2021-03-10 DIAGNOSIS — I480 Paroxysmal atrial fibrillation: Secondary | ICD-10-CM | POA: Diagnosis not present

## 2021-03-10 DIAGNOSIS — I10 Essential (primary) hypertension: Secondary | ICD-10-CM | POA: Diagnosis not present

## 2021-03-10 MED ORDER — NITROGLYCERIN 0.4 MG SL SUBL: SUBLINGUAL_TABLET | SUBLINGUAL | 1 refills | Status: DC

## 2021-03-29 ENCOUNTER — Ambulatory Visit (INDEPENDENT_AMBULATORY_CARE_PROVIDER_SITE_OTHER): Payer: Medicare Other

## 2021-03-29 ENCOUNTER — Other Ambulatory Visit: Payer: Self-pay

## 2021-03-29 ENCOUNTER — Encounter (INDEPENDENT_AMBULATORY_CARE_PROVIDER_SITE_OTHER): Payer: Self-pay

## 2021-03-29 DIAGNOSIS — Z7901 Long term (current) use of anticoagulants: Secondary | ICD-10-CM | POA: Diagnosis not present

## 2021-03-29 DIAGNOSIS — Z5181 Encounter for therapeutic drug level monitoring: Secondary | ICD-10-CM

## 2021-03-29 DIAGNOSIS — I4891 Unspecified atrial fibrillation: Secondary | ICD-10-CM | POA: Diagnosis not present

## 2021-03-29 LAB — POCT INR: INR: 2.5 (ref 2.0–3.0)

## 2021-03-29 NOTE — Patient Instructions (Signed)
Description   Continue taking Warfarin 1.5 tablets daily except 1 tablet on Sundays, Tuesdays and Thursdays.  Recheck in 8 weeks. Call us with any medication changes or concerns # [phone] [facility] Main # [phone].

## 2021-05-19 ENCOUNTER — Other Ambulatory Visit: Payer: Self-pay | Admitting: Cardiology

## 2021-05-24 ENCOUNTER — Ambulatory Visit (INDEPENDENT_AMBULATORY_CARE_PROVIDER_SITE_OTHER): Payer: Medicare Other | Admitting: *Deleted

## 2021-05-24 ENCOUNTER — Encounter (INDEPENDENT_AMBULATORY_CARE_PROVIDER_SITE_OTHER): Payer: Self-pay

## 2021-05-24 ENCOUNTER — Other Ambulatory Visit: Payer: Self-pay

## 2021-05-24 DIAGNOSIS — I4891 Unspecified atrial fibrillation: Secondary | ICD-10-CM | POA: Diagnosis not present

## 2021-05-24 DIAGNOSIS — Z7901 Long term (current) use of anticoagulants: Secondary | ICD-10-CM

## 2021-05-24 DIAGNOSIS — Z5181 Encounter for therapeutic drug level monitoring: Secondary | ICD-10-CM | POA: Diagnosis not present

## 2021-05-24 LAB — POCT INR: INR: 3.5 — AB (ref 2.0–3.0)

## 2021-05-24 NOTE — Patient Instructions (Signed)
Description   Do not take any Warfarin today then continue taking Warfarin 1.5 tablets daily except 1 tablet on Sundays, Tuesdays and Thursdays.  Recheck in 6 weeks (normally 8 weeks). Call us with any medication changes or concerns # [phone] [facility], Main # [phone].

## 2021-07-05 ENCOUNTER — Other Ambulatory Visit: Payer: Self-pay

## 2021-07-05 ENCOUNTER — Ambulatory Visit (INDEPENDENT_AMBULATORY_CARE_PROVIDER_SITE_OTHER): Payer: Medicare Other | Admitting: *Deleted

## 2021-07-05 DIAGNOSIS — Z7901 Long term (current) use of anticoagulants: Secondary | ICD-10-CM | POA: Diagnosis not present

## 2021-07-05 DIAGNOSIS — Z5181 Encounter for therapeutic drug level monitoring: Secondary | ICD-10-CM | POA: Diagnosis not present

## 2021-07-05 DIAGNOSIS — I4891 Unspecified atrial fibrillation: Secondary | ICD-10-CM

## 2021-07-05 LAB — POCT INR: INR: 2.7 (ref 2.0–3.0)

## 2021-07-05 NOTE — Patient Instructions (Signed)
Description   Continue taking Warfarin 1.5 tablets daily except 1 tablet on Sundays, Tuesdays and Thursdays.  Recheck in 6 weeks. Call us with any medication changes or concerns # [phone] [facility], Main # [phone].

## 2021-07-23 ENCOUNTER — Telehealth: Payer: Self-pay | Admitting: Cardiology

## 2021-07-23 NOTE — Telephone Encounter (Signed)
Patient c/o Palpitations:  High priority if patient c/o lightheadedness, shortness of breath, or chest pain  How long have you had palpitations/irregular HR/ Afib? Are you having the symptoms now? PT HAD AFIB ON 07/18/21  Are you currently experiencing lightheadedness, SOB or CP? NO  Do you have a history of afib (atrial fibrillation) or irregular heart rhythm? YES  Have you checked your BP or HR? (document readings if available): 143/75 HR 64 THIS MORNING AT 7AM  Are you experiencing any other symptoms? NO OTHER SYMPTOMS. PT WASN'T AWARE OF THE AFIB HE ONLY KNEW HE WAS IN AFIB BECAUSE HIS APPLE WATCH ALERTED HIM.

## 2021-07-23 NOTE — Telephone Encounter (Signed)
Tried calling patient. Mailbox is full, unable to leave a message.

## 2021-07-26 NOTE — Telephone Encounter (Signed)
Unable to reach pt or leave a message mailbox is full 

## 2021-08-04 NOTE — Telephone Encounter (Signed)
Attempted to contact patient again. 3rd attempt.  Unable to leave a message as voicemail box is full.   Patient has coumadin clinic appointment on 12/12.

## 2021-08-05 ENCOUNTER — Other Ambulatory Visit: Payer: Self-pay | Admitting: Cardiology

## 2021-08-06 ENCOUNTER — Other Ambulatory Visit: Payer: Self-pay | Admitting: Cardiology

## 2021-08-09 ENCOUNTER — Other Ambulatory Visit: Payer: Self-pay | Admitting: Cardiology

## 2021-08-09 DIAGNOSIS — I480 Paroxysmal atrial fibrillation: Secondary | ICD-10-CM

## 2021-08-09 DIAGNOSIS — I2510 Atherosclerotic heart disease of native coronary artery without angina pectoris: Secondary | ICD-10-CM

## 2021-08-09 DIAGNOSIS — Z7901 Long term (current) use of anticoagulants: Secondary | ICD-10-CM

## 2021-08-09 DIAGNOSIS — E785 Hyperlipidemia, unspecified: Secondary | ICD-10-CM

## 2021-08-09 DIAGNOSIS — I10 Essential (primary) hypertension: Secondary | ICD-10-CM

## 2021-08-14 ENCOUNTER — Other Ambulatory Visit: Payer: Self-pay | Admitting: Cardiology

## 2021-08-14 DIAGNOSIS — I480 Paroxysmal atrial fibrillation: Secondary | ICD-10-CM

## 2021-08-14 DIAGNOSIS — I10 Essential (primary) hypertension: Secondary | ICD-10-CM

## 2021-08-14 DIAGNOSIS — Z7901 Long term (current) use of anticoagulants: Secondary | ICD-10-CM

## 2021-08-14 DIAGNOSIS — E785 Hyperlipidemia, unspecified: Secondary | ICD-10-CM

## 2021-08-14 DIAGNOSIS — I2510 Atherosclerotic heart disease of native coronary artery without angina pectoris: Secondary | ICD-10-CM

## 2021-08-16 ENCOUNTER — Ambulatory Visit (INDEPENDENT_AMBULATORY_CARE_PROVIDER_SITE_OTHER): Payer: Medicare Other | Admitting: *Deleted

## 2021-08-16 ENCOUNTER — Other Ambulatory Visit: Payer: Self-pay

## 2021-08-16 DIAGNOSIS — Z7901 Long term (current) use of anticoagulants: Secondary | ICD-10-CM

## 2021-08-16 DIAGNOSIS — Z5181 Encounter for therapeutic drug level monitoring: Secondary | ICD-10-CM

## 2021-08-16 DIAGNOSIS — I4891 Unspecified atrial fibrillation: Secondary | ICD-10-CM | POA: Diagnosis not present

## 2021-08-16 LAB — POCT INR: INR: 2.4 (ref 2.0–3.0)

## 2021-08-16 NOTE — Patient Instructions (Signed)
Description   Continue taking Warfarin 1.5 tablets daily except 1 tablet on Sundays, Tuesdays and Thursdays.  Recheck in 6 weeks. Call us with any medication changes or concerns # [phone] [facility], Main # [phone].

## 2021-08-21 ENCOUNTER — Other Ambulatory Visit: Payer: Self-pay | Admitting: Cardiology

## 2021-08-21 DIAGNOSIS — I10 Essential (primary) hypertension: Secondary | ICD-10-CM

## 2021-08-21 DIAGNOSIS — E785 Hyperlipidemia, unspecified: Secondary | ICD-10-CM

## 2021-08-21 DIAGNOSIS — I480 Paroxysmal atrial fibrillation: Secondary | ICD-10-CM

## 2021-08-21 DIAGNOSIS — Z7901 Long term (current) use of anticoagulants: Secondary | ICD-10-CM

## 2021-08-21 DIAGNOSIS — I2510 Atherosclerotic heart disease of native coronary artery without angina pectoris: Secondary | ICD-10-CM

## 2021-09-27 ENCOUNTER — Ambulatory Visit (INDEPENDENT_AMBULATORY_CARE_PROVIDER_SITE_OTHER): Payer: Medicare Other | Admitting: *Deleted

## 2021-09-27 ENCOUNTER — Other Ambulatory Visit: Payer: Self-pay

## 2021-09-27 DIAGNOSIS — I4891 Unspecified atrial fibrillation: Secondary | ICD-10-CM

## 2021-09-27 DIAGNOSIS — Z5181 Encounter for therapeutic drug level monitoring: Secondary | ICD-10-CM | POA: Diagnosis not present

## 2021-09-27 DIAGNOSIS — Z7901 Long term (current) use of anticoagulants: Secondary | ICD-10-CM

## 2021-09-27 LAB — POCT INR: INR: 2.4 (ref 2.0–3.0)

## 2021-09-27 NOTE — Patient Instructions (Signed)
Description   Continue taking Warfarin 1.5 tablets daily except 1 tablet on Sundays, Tuesdays and Thursdays.  Recheck in 6 weeks. Call us with any medication changes or concerns # [phone] [facility], Main # [phone].

## 2021-10-14 NOTE — Progress Notes (Signed)
[name] Date of Birth: 1940/10/19   History of Present Illness: Mr. Cardozo is seen today for follow up CAD and atrial fibrillation/flutter.  He has a history of coronary disease with prior coronary bypass surgery in 2005. Last evaluation with cardiac catheterization in July of 2012 showed a patent LIMA graft to the LAD. The second marginal branch was occluded and the first marginal branch had a moderately severe stenosis. This is treated medically. His last Myoview study here in November 2013 showed a small apical fixed defect without ischemia. Ejection fraction 69%. He does have a history of paroxysmal atrial fibrillation and is on chronic Coumadin therapy.  In February 2015 he had atrial flutter that resolved with IV Cardizem.  Echo in March 2015 showed a normal study except for mild diastolic dysfunction. In June 2016 he was visiting family in [place] when he developed severe HTN with BP increased to 218/118. He was admitted to the hospital in [facility], [place] and had extensive work up there. Myoview study showed a fixed inferior defect without ischemia and normal EF. Echo was normal.    He is now followed by pulmonary for COPD and sleep apnea. He has not required CPAP yet. On his last visit he continued to have large spikes in his BP particularly when visiting family in [place]. RA dopplers were obtained and showed no significant stenosis. 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 Hydralazine 25 mg tid was added for BP control.   On follow up today he is seen with his son. He reports he is doing very well.  No chest pain or dyspnea. No palpitations. BP control has improved. Tolerating medication well. Not as active this winter and he has gained weight. Notes Apple watch says he is in Afib at times but rate is controlled and he is asymptomatic.   Allergies as of 10/18/2021       Reactions   Cephalexin Rash        Medication List        Accurate as of October 18, 2021  8:11  AM. If you have any questions, ask your nurse or doctor.          acetaminophen 500 MG tablet Commonly known as: TYLENOL Take 1,000 mg by mouth daily.   albuterol 108 (90 Base) MCG/ACT inhaler Commonly known as: VENTOLIN HFA Inhale 2 puffs into the lungs every 6 (six) hours as needed for wheezing or shortness of breath.   allopurinol 300 MG tablet Commonly known as: ZYLOPRIM TAKE ONE TABLET BY MOUTH DAILY   amLODipine 10 MG tablet Commonly known as: NORVASC TAKE ONE TABLET BY MOUTH DAILY   atorvastatin 40 MG tablet Commonly known as: LIPITOR TAKE ONE TABLET BY MOUTH DAILY   augmented betamethasone dipropionate 0.05 % cream Commonly known as: DIPROLENE-AF Use as directed   b complex vitamins tablet Take 1 tablet by mouth daily.   cloNIDine 0.2 MG tablet Commonly known as: CATAPRES TAKE ONE TABLET BY MOUTH TWICE A DAY   fish oil-omega-3 fatty acids 1000 MG capsule Take 1 g by mouth 2 (two) times daily.   furosemide 20 MG tablet Commonly known as: LASIX TAKE ONE TABLET BY MOUTH DAILY   GLIPIZIDE PO Take 5 mg by mouth daily.   GLUCOSAMINE CHOND MSM FORMULA PO Take 2 capsules by mouth daily.   hydrALAZINE 25 MG tablet Commonly known as: APRESOLINE TAKE ONE TABLET BY MOUTH THREE TIMES A DAY   isosorbide mononitrate 30 MG 24 hr  tablet Commonly known as: IMDUR TAKE ONE TABLET BY MOUTH DAILY   lisinopril 40 MG tablet Commonly known as: ZESTRIL TAKE ONE TABLET BY MOUTH DAILY   metFORMIN 500 MG 24 hr tablet Commonly known as: GLUCOPHAGE-XR Take 2 tablets by mouth 2 (two) times daily. Take 2 tabs twice daily   metoprolol succinate 100 MG 24 hr tablet Commonly known as: TOPROL-XL TAKE ONE TABLET BY MOUTH DAILY WITH OR IMMEDIATELY FOLLOWING A MEAL; **MUST CALL MD FOR APPOINTMENT   multivitamin with minerals Tabs tablet Take 1 tablet by mouth daily.   nitroGLYCERIN 0.4 MG SL tablet Commonly known as: Nitrostat PLACE 1 TABLET (0.4 MG TOTAL) UNDER THE TONGUE  EVERY 5  MINUTES AS NEEDED FOR CHEST PAIN.   ONE TOUCH ULTRA TEST test strip Generic drug: glucose blood   onetouch ultrasoft lancets   vitamin C 500 MG tablet Commonly known as: ASCORBIC ACID Take 1,000 mg by mouth daily.   warfarin 5 MG tablet Commonly known as: COUMADIN Take as directed by the anticoagulation clinic. If you are unsure how to take this medication, talk to your nurse or doctor. Original instructions: TAKE 1 TO 1 AND A HALF TABLETS BY MOUTH DAILY AS DIRECTED BY COUMADIN CLINIC        Allergies  Allergen Reactions   Cephalexin Rash    Past Medical History:  Diagnosis Date   Chest pain July 2012   s/p repeat cath; stable anatomy. Managed medically   Coronary artery disease    Current use of long term anticoagulation    Diabetes mellitus    Dyslipidemia    Edema    Full dentures    Gout    History of acute myocardial infarction 11/2005   acute anteroseptal myocardial infarction   Hyperlipidemia    Hypertension    Osteoarthritis    Paroxysmal atrial fibrillation [facility])    Wears glasses     Past Surgical History:  Procedure Laterality Date   CARDIAC CATHETERIZATION  05/18/2009   CARDIAC CATHETERIZATION  July 2012   Stable anatomy. Manage medically   COLONOSCOPY     CORONARY ARTERY BYPASS GRAFT  2005   DEBRIDEMENT AND CLOSURE WOUND Left 06/18/2014   Procedure: CLOSURE OF OPEN WOUND OF LEFT CHEEK/POSSIBLE CHEEK ADVANCEMENT/ROTATION;  Surgeon: Theodoro Kos, DO;  Location: [place];  Service: Plastics;  Laterality: Left;   INCISION AND DRAINAGE OF WOUND Left 07/17/2014   Procedure: IRRIGATION AND DEBRIDEMENT LEFT CHEEK WOUND WITH PLACEMENT OF ACELL;  Surgeon: Theodoro Kos, DO;  Location: [place];  Service: Plastics;  Laterality: Left;   TONSILLECTOMY      Social History   Tobacco Use  Smoking Status Former   Packs/day: 3.00   Years: 30.00   Pack years: 90.00   Types: Cigarettes   Start date: 09/28/1962    Quit date: 1989   Years since quitting: 34.1  Smokeless Tobacco Never    Social History   Substance and Sexual Activity  Alcohol Use No    Family History  Problem Relation Age of Onset   Coronary artery disease Mother    Heart disease Mother    Coronary artery disease Father    Heart disease Father    Cancer Brother    Cancer Brother     Review of Systems: The review of systems is as above.  All other systems were reviewed and are negative.  Physical Exam: BP 138/72    Pulse 74    Ht 5\' 9"  (1.753  m)    Wt 206 lb 12.8 oz (93.8 kg)    SpO2 96%    BMI 30.54 kg/m  GENERAL:  Well appearing WM in NAD HEENT:  PERRL, EOMI, sclera are clear. Oropharynx is clear. NECK:  No jugular venous distention, carotid upstroke brisk and symmetric, no bruits, no thyromegaly or adenopathy LUNGS:  Clear to auscultation bilaterally CHEST:  Unremarkable HEART:  IRRR,  PMI not displaced or sustained,S1 and S2 within normal limits, no S3, no S4: no clicks, no rubs, no murmurs ABD:  Soft, nontender. BS +, no masses or bruits. No hepatomegaly, no splenomegaly EXT:  2 + pulses throughout, tr edema- support hose in place, no cyanosis no clubbing SKIN:  Warm and dry.  No rashes NEURO:  Alert and oriented x 3. Cranial nerves II through XII intact. PSYCH:  Cognitively intact  LABORATORY DATA:    Lab Results  Component Value Date   WBC 8.7 10/28/2014   HGB 14.9 10/28/2014   HCT 44.0 10/28/2014   PLT 220 10/28/2014   GLUCOSE 128 (H) 10/28/2014   CHOL 135 04/02/2012   TRIG 130.0 04/02/2012   HDL 41.30 04/02/2012   LDLCALC 68 04/02/2012   ALT 28 03/18/2011   AST 27 03/18/2011   NA 136 10/28/2014   K 3.7 10/28/2014   CL 102 10/28/2014   CREATININE 0.95 10/28/2014   BUN 16 10/28/2014   CO2 26 10/28/2014   TSH 0.882 03/19/2011   INR 2.4 09/27/2021   HGBA1C (H) 05/16/2009    6.3 (NOTE) The ADA recommends the following therapeutic goal for glycemic control related to Hgb A1c measurement: Goal of  therapy: <6.5 Hgb A1c  Reference: American Diabetes Association: Clinical Practice Recommendations 2010, Diabetes Care, 2010, 33: (Suppl  1).     Labs dated 05/31/16: glucose 126. CMET normal. A1c 6.1%.  Dated 12/01/15: cholesterol 143, triglycerides 95, LDL 72, HDL 52. Dated 11/28/16: A1c 6.9%. Glucose 156, otherwise CMET normal. Cholesterol 142, triglycerides 207, HDL 40, LDL 61 Dated 06/01/17: A1c 7%. Glucose 141, cholesterol 120, triglycerides 151, HDL 41, LDL 49. CMET normal.  Dated 11/30/17: A1c 7.1%. Glucose 137. Other chemistries normal.  Dated 10/05/18: glucose 152, A1c 7.2%. otherwise CMET and CBC normal. Cholesterol 126, triglycerides 174, HDL 37, LDL 54.  Dated 04/05/19: glucose 204, A1c  7.3%. otherwise CMET normal Dated 10/23/19: cholesterol 120, triglycerides 198, HDL 35, LDL 53. A1c 7.4%. glucose 217 otherwise CMET normal. CBC normal Dated 04/24/20: A1c 7.2%. Glucose 140, otherwise CMET normal.  Dated 11/06/20: cholesterol 138, triglycerides 209, HDL 38, LDL 67. A1c 6.3%. CMET normal. Dated 05/08/21: cholesterol 139, triglycerides 181, HDL 42, LDL 67, Dated 08/07/21: A1c 6.6%. sodium 147. Otherwise CMET and CBC normal  Ecg today shows Afib with rate 81 bpm. Otherwise normal. I have personally reviewed and interpreted this study.   Assessment / Plan:  1. Coronary disease status post CABG in 2005. Vein graft to the OM is known to be occluded. He is asymptomatic on medical therapy. Last Myoview in 2018 without ischemia and normal EF.  He will continue with his medical therapy.  2. Atrial fibrillation/flutter- paroxysmal. He is on chronic Coumadin therapy. Looks like rate is well controlled when in Afib. He is asymptomatic.  Continue current therapy  3. Hyperlipidemia. On statin therapy and fish oil. Excellent control of LDL. Mildly elevated triglycerides. Encouraged weight loss.   4. HTN.  BP is well controlled now.  5. 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 Last A1c 6.6%. encourage weight loss.  6.  Edema. Chronic.  Continue lasix, elevation, support hose, and sodium restriction. Stable on exam today  8. OSA- followed by pulmonary - has not required CPAP yet.   9. 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 Will monitor.   I will follow up in 6 months.

## 2021-10-18 ENCOUNTER — Ambulatory Visit (INDEPENDENT_AMBULATORY_CARE_PROVIDER_SITE_OTHER): Payer: Medicare Other | Admitting: Cardiology

## 2021-10-18 ENCOUNTER — Encounter: Payer: Self-pay | Admitting: Cardiology

## 2021-10-18 ENCOUNTER — Other Ambulatory Visit: Payer: Self-pay

## 2021-10-18 VITALS — BP 138/72 | HR 74 | Ht 69.0 in | Wt 206.8 lb

## 2021-10-18 DIAGNOSIS — I10 Essential (primary) hypertension: Secondary | ICD-10-CM

## 2021-10-18 DIAGNOSIS — I480 Paroxysmal atrial fibrillation: Secondary | ICD-10-CM

## 2021-10-18 DIAGNOSIS — E782 Mixed hyperlipidemia: Secondary | ICD-10-CM | POA: Diagnosis not present

## 2021-10-18 DIAGNOSIS — I25708 Atherosclerosis of coronary artery bypass graft(s), unspecified, with other forms of angina pectoris: Secondary | ICD-10-CM

## 2021-10-18 MED ORDER — WARFARIN SODIUM 5 MG PO TABS: ORAL_TABLET | ORAL | 3 refills | Status: DC

## 2021-10-21 ENCOUNTER — Other Ambulatory Visit: Payer: Self-pay | Admitting: Cardiology

## 2021-11-08 ENCOUNTER — Ambulatory Visit (INDEPENDENT_AMBULATORY_CARE_PROVIDER_SITE_OTHER): Payer: Medicare Other | Admitting: *Deleted

## 2021-11-08 ENCOUNTER — Other Ambulatory Visit: Payer: Self-pay

## 2021-11-08 DIAGNOSIS — Z7901 Long term (current) use of anticoagulants: Secondary | ICD-10-CM | POA: Diagnosis not present

## 2021-11-08 DIAGNOSIS — I4891 Unspecified atrial fibrillation: Secondary | ICD-10-CM

## 2021-11-08 DIAGNOSIS — I480 Paroxysmal atrial fibrillation: Secondary | ICD-10-CM | POA: Diagnosis not present

## 2021-11-08 DIAGNOSIS — Z5181 Encounter for therapeutic drug level monitoring: Secondary | ICD-10-CM

## 2021-11-08 LAB — POCT INR: INR: 2.3 (ref 2.0–3.0)

## 2021-11-08 NOTE — Patient Instructions (Signed)
Description   ?Continue taking Warfarin 1.5 tablets daily except 1 tablet on Sundays, Tuesdays and Thursdays.  Recheck in 6 weeks. Call us with any medication changes or concerns # [phone] [facility], Main # [phone].  ?  ?  ?

## 2021-11-22 ENCOUNTER — Other Ambulatory Visit: Payer: Self-pay | Admitting: Pulmonary Disease

## 2021-11-29 ENCOUNTER — Ambulatory Visit (INDEPENDENT_AMBULATORY_CARE_PROVIDER_SITE_OTHER): Payer: Medicare Other | Admitting: Pulmonary Disease

## 2021-11-29 ENCOUNTER — Other Ambulatory Visit: Payer: Self-pay

## 2021-11-29 ENCOUNTER — Encounter: Payer: Self-pay | Admitting: Pulmonary Disease

## 2021-11-29 VITALS — BP 110/64 | HR 68 | Temp 97.6°F | Ht 69.0 in | Wt 207.0 lb

## 2021-11-29 DIAGNOSIS — G4733 Obstructive sleep apnea (adult) (pediatric): Secondary | ICD-10-CM

## 2021-11-29 NOTE — Patient Instructions (Addendum)
History of obstructive sleep apnea ?Shortness of breath ? ?With no significant symptoms at present, I do believe we do not need to make any changes ? ?Continue weight loss efforts ?Regular exercises ? ?Your last study did show that there was significant number of events, if your weight were to continue to increase, I believe your sleep apnea symptoms will get worse ? ?Call with significant concerns ? ?I will see you a year from now ? ?Continue albuterol ?

## 2021-11-29 NOTE — Progress Notes (Signed)
? ?      ?Arath Kaigler    [id number]    07/18/1941 ? ?Primary Care Physician:Manfredi, Lesle Chris, MD ? ?Referring Physician: Jolinda Croak, MD ?[address] ?[address] ?[place],  [place] [address] ? ?Chief complaint:   ?In for follow-up of shortness of breath, abnormal breathing at night ? ?HPI: ? ?Diagnosed with mild obstructive sleep apnea ?-Elected not to start CPAP and focus on weight loss efforts ?Repeat sleep study in March 2020 did show severe number of events ? ?Weight has fluctuated, weight is increased about 10 pounds compared to last visit ? ?He sleeps better, no longer having any difficulty with his sleep at night ?Has not been told about snoring ?Feels he is waking up feeling like he is at a decent nights rest ? ?He was on Anoro for shortness of breath but has been off Anoro and doing well with albuterol ?Rarely uses albuterol at present ?-Albuterol use is only about once to twice a month ? ?-Inhaler technique reviewed today and he was taught how to use inhalers better ? ?Has an upcoming appointment with cardiology and also with his primary care doctor ?He has no new symptoms ?Breathing feels good ? ?Abnormal PFT showing moderate obstructive lung disease ? ?He was a trucker ?Smoked 3 packs a day for many years, reformed ? ?He usually tries to go to bed between 10 and 12 midnight, takes him about 5 to 10 minutes to fall asleep, wakes up a couple of times during the night, wakes up between 5 and 6 AM in the morning. ?He is very active usually ?He gets short of breath walking up a flight of steps ?Gets short of breath with significant exertion ?He does not feel limited with any activities, he is aware to take his time ? ?Denies a chronic cough ?Has no fevers or chills ? ? ?Occupation: Trucking ?Smoking history: Reformed smoker ? ?Outpatient Encounter Medications as of 11/29/2021  ?Medication Sig  ? acetaminophen (TYLENOL) 500 MG tablet Take 1,000 mg by mouth daily.  ? albuterol (VENTOLIN HFA)  108 (90 Base) MCG/ACT inhaler INHALE TWO PUFFS BY MOUTH EVERY 6 HOURS AS NEEDED FOR WHEEZING OR FOR SHORTNESS OF BREATH  ? allopurinol (ZYLOPRIM) 300 MG tablet TAKE ONE TABLET BY MOUTH DAILY  ? amLODipine (NORVASC) 10 MG tablet TAKE ONE TABLET BY MOUTH DAILY  ? Ascorbic Acid (VITAMIN C) 500 MG tablet Take 1,000 mg by mouth daily.  ? atorvastatin (LIPITOR) 40 MG tablet TAKE ONE TABLET BY MOUTH DAILY  ? augmented betamethasone dipropionate (DIPROLENE-AF) 0.05 % cream Use as directed  ? b complex vitamins tablet Take 1 tablet by mouth daily.  ? cloNIDine (CATAPRES) 0.2 MG tablet TAKE ONE TABLET BY MOUTH TWICE A DAY  ? fish oil-omega-3 fatty acids 1000 MG capsule Take 1 g by mouth 2 (two) times daily.  ? furosemide (LASIX) 20 MG tablet TAKE ONE TABLET BY MOUTH DAILY  ? GLIPIZIDE PO Take 5 mg by mouth daily.   ? hydrALAZINE (APRESOLINE) 25 MG tablet TAKE ONE TABLET BY MOUTH THREE TIMES A DAY  ? isosorbide mononitrate (IMDUR) 30 MG 24 hr tablet TAKE ONE TABLET BY MOUTH DAILY  ? Lancets (ONETOUCH ULTRASOFT) lancets   ? lisinopril (ZESTRIL) 40 MG tablet TAKE ONE TABLET BY MOUTH DAILY  ? metFORMIN (GLUCOPHAGE-XR) 500 MG 24 hr tablet Take 2 tablets by mouth 2 (two) times daily. Take 2 tabs twice daily  ? metoprolol succinate (TOPROL-XL) 100 MG 24 hr tablet TAKE  ONE TABLET BY MOUTH DAILY WITH OR IMMEDIATELY FOLLOWING A MEAL; **MUST CALL MD FOR APPOINTMENT  ? Misc Natural Products (GLUCOSAMINE CHOND MSM FORMULA PO) Take 2 capsules by mouth daily.  ? Multiple Vitamin (MULTIVITAMIN WITH MINERALS) TABS tablet Take 1 tablet by mouth daily.  ? nitroGLYCERIN (NITROSTAT) 0.4 MG SL tablet PLACE 1 TABLET (0.4 MG TOTAL) UNDER THE TONGUE EVERY 5  MINUTES AS NEEDED FOR CHEST PAIN.  ? ONE TOUCH ULTRA TEST test strip   ? warfarin (COUMADIN) 5 MG tablet TAKE ONE TO ONE AND ONE-HALF TABLETS BY MOUTH DAILY AS DIRECTED BY COUMADIN CLINIC  ? ?No facility-administered encounter medications on file as of 11/29/2021.  ? ? ?Allergies as of 11/29/2021  - Review Complete 11/29/2021  ?Allergen Reaction Noted  ? Cephalexin Rash 12/20/2010  ? ? ?Past Medical History:  ?Diagnosis Date  ? Chest pain July 2012  ? s/p repeat cath; stable anatomy. Managed medically  ? Coronary artery disease   ? Current use of long term anticoagulation   ? Diabetes mellitus   ? Dyslipidemia   ? Edema   ? Full dentures   ? Gout   ? History of acute myocardial infarction 11/2005  ? acute anteroseptal myocardial infarction  ? Hyperlipidemia   ? Hypertension   ? Osteoarthritis   ? Paroxysmal atrial fibrillation (HCC)   ? Wears glasses   ? ? ?Past Surgical History:  ?Procedure Laterality Date  ? CARDIAC CATHETERIZATION  05/18/2009  ? CARDIAC CATHETERIZATION  July 2012  ? Stable anatomy. Manage medically  ? COLONOSCOPY    ? CORONARY ARTERY BYPASS GRAFT  2005  ? DEBRIDEMENT AND CLOSURE WOUND Left 06/18/2014  ? Procedure: CLOSURE OF OPEN WOUND OF LEFT CHEEK/POSSIBLE CHEEK ADVANCEMENT/ROTATION;  Surgeon: Theodoro Kos, DO;  Location: [place];  Service: Plastics;  Laterality: Left;  ? INCISION AND DRAINAGE OF WOUND Left 07/17/2014  ? Procedure: IRRIGATION AND DEBRIDEMENT LEFT CHEEK WOUND WITH PLACEMENT OF ACELL;  Surgeon: Theodoro Kos, DO;  Location: [facility];  Service: Plastics;  Laterality: Left;  ? TONSILLECTOMY    ? ? ?Family History  ?Problem Relation Age of Onset  ? Coronary artery disease Mother   ? Heart disease Mother   ? Coronary artery disease Father   ? Heart disease Father   ? Cancer Brother   ? Cancer Brother   ? ? ?Social History  ? ?Socioeconomic History  ? Marital status: Single  ?  Spouse name: Not on file  ? Number of children: Not on file  ? Years of education: Not on file  ? Highest education level: Not on file  ?Occupational History  ? Not on file  ?Tobacco Use  ? Smoking status: Former  ?  Packs/day: 3.00  ?  Years: 30.00  ?  Pack years: 90.00  ?  Types: Cigarettes  ?  Start date: 09/28/1962  ?  Quit date: 1989  ?  Years since quitting: 34.2   ? Smokeless tobacco: Never  ?Substance and Sexual Activity  ? Alcohol use: No  ? Drug use: No  ? Sexual activity: Never  ?Other Topics Concern  ? Not on file  ?Social History Narrative  ? Not on file  ? ?Social Determinants of Health  ? ?Financial Resource Strain: Not on file  ?Food Insecurity: Not on file  ?Transportation Needs: Not on file  ?Physical Activity: Not on file  ?Stress: Not on file  ?Social Connections: Not on file  ?Intimate Partner Violence: Not on  file  ? ? ?Review of Systems  ?Constitutional:  Negative for fatigue.  ?HENT: Negative.  Negative for congestion.   ?Eyes: Negative.   ?Respiratory:  Positive for apnea. Negative for cough, shortness of breath and wheezing.   ?Cardiovascular: Negative.  Negative for chest pain and leg swelling.  ?Gastrointestinal: Negative.   ?Endocrine: Negative.   ?Musculoskeletal: Negative.   ?Skin: Negative.   ?Neurological:  Negative for dizziness.  ?Psychiatric/Behavioral:  Negative for agitation and sleep disturbance.   ?All other systems reviewed and are negative. ?Vitals:  ? 11/29/21 0923  ?BP: 110/64  ?Pulse: 68  ?Temp: 97.6 ?F (36.4 ?C)  ?SpO2: 97%  ? ?Physical Exam ?Constitutional:   ?   General: He is not in acute distress. ?   Appearance: Normal appearance. He is well-developed. He is obese. He is not diaphoretic.  ?   Comments: Overweight  ?HENT:  ?   Head: Normocephalic and atraumatic.  ?Eyes:  ?   Conjunctiva/sclera: Conjunctivae normal.  ?Neck:  ?   Thyroid: No thyromegaly.  ?Cardiovascular:  ?   Rate and Rhythm: Normal rate and regular rhythm.  ?   Heart sounds: Normal heart sounds.  ?Pulmonary:  ?   Effort: Pulmonary effort is normal. No respiratory distress.  ?   Breath sounds: Normal breath sounds. No wheezing or rales.  ?Musculoskeletal:  ?   Cervical back: No rigidity or tenderness.  ?Neurological:  ?   Mental Status: He is alert.  ?Psychiatric:     ?   Mood and Affect: Mood normal.  ? ?Data Reviewed: ?Study from March 2020 reviewed showing severe  obstructive sleep apnea, optimal titration was not achieved, recommended auto CPAP ? ?Echocardiogram from 2015 revealed ejection fraction of 60 to 51%, diastolic dysfunction ? ?Pulmonary function study

## 2021-12-20 ENCOUNTER — Ambulatory Visit (INDEPENDENT_AMBULATORY_CARE_PROVIDER_SITE_OTHER): Payer: Medicare Other | Admitting: *Deleted

## 2021-12-20 DIAGNOSIS — Z7901 Long term (current) use of anticoagulants: Secondary | ICD-10-CM

## 2021-12-20 DIAGNOSIS — I4891 Unspecified atrial fibrillation: Secondary | ICD-10-CM | POA: Diagnosis not present

## 2021-12-20 DIAGNOSIS — Z5181 Encounter for therapeutic drug level monitoring: Secondary | ICD-10-CM

## 2021-12-20 DIAGNOSIS — I480 Paroxysmal atrial fibrillation: Secondary | ICD-10-CM | POA: Diagnosis not present

## 2021-12-20 LAB — POCT INR: INR: 2.3 (ref 2.0–3.0)

## 2021-12-20 NOTE — Patient Instructions (Signed)
Description   ?Continue taking Warfarin 1.5 tablets daily except 1 tablet on Sundays, Tuesdays and Thursdays.  Recheck in 6 weeks. Call us with any medication changes or concerns # [phone] [facility], Main # [phone].  ?  ?  ?

## 2022-01-18 ENCOUNTER — Other Ambulatory Visit: Payer: Self-pay | Admitting: Cardiology

## 2022-01-18 DIAGNOSIS — I2510 Atherosclerotic heart disease of native coronary artery without angina pectoris: Secondary | ICD-10-CM

## 2022-01-18 DIAGNOSIS — I10 Essential (primary) hypertension: Secondary | ICD-10-CM

## 2022-01-18 DIAGNOSIS — I480 Paroxysmal atrial fibrillation: Secondary | ICD-10-CM

## 2022-01-18 DIAGNOSIS — Z7901 Long term (current) use of anticoagulants: Secondary | ICD-10-CM

## 2022-01-18 DIAGNOSIS — E785 Hyperlipidemia, unspecified: Secondary | ICD-10-CM

## 2022-02-07 ENCOUNTER — Ambulatory Visit (INDEPENDENT_AMBULATORY_CARE_PROVIDER_SITE_OTHER): Payer: Medicare Other

## 2022-02-07 DIAGNOSIS — I4891 Unspecified atrial fibrillation: Secondary | ICD-10-CM

## 2022-02-07 DIAGNOSIS — Z7901 Long term (current) use of anticoagulants: Secondary | ICD-10-CM | POA: Diagnosis not present

## 2022-02-07 DIAGNOSIS — Z5181 Encounter for therapeutic drug level monitoring: Secondary | ICD-10-CM

## 2022-02-07 LAB — POCT INR: INR: 2.2 (ref 2.0–3.0)

## 2022-02-07 NOTE — Patient Instructions (Signed)
Continue taking Warfarin 1.5 tablets daily except 1 tablet on Sundays, Tuesdays and Thursdays.  Recheck in 6 weeks. Call us with any medication changes or concerns # [phone] [facility], Main # [phone].  

## 2022-03-21 ENCOUNTER — Ambulatory Visit (INDEPENDENT_AMBULATORY_CARE_PROVIDER_SITE_OTHER): Payer: Medicare Other

## 2022-03-21 DIAGNOSIS — Z7901 Long term (current) use of anticoagulants: Secondary | ICD-10-CM | POA: Diagnosis not present

## 2022-03-21 DIAGNOSIS — Z5181 Encounter for therapeutic drug level monitoring: Secondary | ICD-10-CM

## 2022-03-21 DIAGNOSIS — I4891 Unspecified atrial fibrillation: Secondary | ICD-10-CM

## 2022-03-21 LAB — POCT INR: INR: 2.4 (ref 2.0–3.0)

## 2022-03-21 NOTE — Patient Instructions (Signed)
Continue taking Warfarin 1.5 tablets daily except 1 tablet on Sundays, Tuesdays and Thursdays.  Recheck in 6 weeks. Call us with any medication changes or concerns # [phone] [facility], Main # [phone].

## 2022-04-15 NOTE — Progress Notes (Unsigned)
[name] Date of Birth: June 12, 1941   History of Present Illness: [name] is seen today for follow up CAD and atrial fibrillation/flutter.  He has a history of coronary disease with prior coronary bypass surgery in 2005. Last evaluation with cardiac catheterization in July of 2012 showed a patent LIMA graft to the LAD. The second marginal branch was occluded and the first marginal branch had a moderately severe stenosis. This is treated medically. Myoview study here in November 2013 showed a small apical fixed defect without ischemia. Ejection fraction 69%. He does have a history of paroxysmal atrial fibrillation and is on chronic Coumadin therapy.  In February 2015 he had atrial flutter that resolved with IV Cardizem.  Echo in March 2015 showed a normal study except for mild diastolic dysfunction. In June 2016 he was visiting family in [place] when he developed severe HTN with BP increased to 218/118. He was admitted to the hospital in [facility], [place] and had extensive work up there. Myoview study showed a fixed inferior defect without ischemia and normal EF. Echo was normal.    He is now followed by pulmonary for COPD and sleep apnea. He has not required CPAP yet. Previously he was having large spikes in his BP particularly when visiting family in [place]. RA dopplers were obtained and showed no significant stenosis. 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 Hydralazine 25 mg tid was added for BP control.   On follow up today he is  doing very well.  No chest pain or dyspnea. No palpitations. BP control is good. Tolerating medication well.he has lost 8 lbs.  Notes Apple watch says he is in Afib at times but rate is controlled and he is asymptomatic. He is limited by chronic hip and knee arthritis.   Allergies as of 04/19/2022       Reactions   Cephalexin Rash        Medication List        Accurate as of April 19, 2022  8:21 AM. If you have any questions, ask your nurse or  doctor.          acetaminophen 500 MG tablet Commonly known as: TYLENOL Take 1,000 mg by mouth daily.   albuterol 108 (90 Base) MCG/ACT inhaler Commonly known as: VENTOLIN HFA INHALE TWO PUFFS BY MOUTH EVERY 6 HOURS AS NEEDED FOR WHEEZING OR FOR SHORTNESS OF BREATH   allopurinol 300 MG tablet Commonly known as: ZYLOPRIM TAKE ONE TABLET BY MOUTH DAILY   amLODipine 10 MG tablet Commonly known as: NORVASC TAKE ONE TABLET BY MOUTH DAILY   ascorbic acid 500 MG tablet Commonly known as: VITAMIN C Take 1,000 mg by mouth daily.   atorvastatin 40 MG tablet Commonly known as: LIPITOR TAKE ONE TABLET BY MOUTH DAILY   augmented betamethasone dipropionate 0.05 % cream Commonly known as: DIPROLENE-AF Use as directed   b complex vitamins tablet Take 1 tablet by mouth daily.   cloNIDine 0.2 MG tablet Commonly known as: CATAPRES TAKE ONE TABLET BY MOUTH TWICE A DAY   fish oil-omega-3 fatty acids 1000 MG capsule Take 1 g by mouth 2 (two) times daily.   furosemide 20 MG tablet Commonly known as: LASIX TAKE ONE TABLET BY MOUTH DAILY   glipiZIDE 10 MG 24 hr tablet Commonly known as: GLUCOTROL XL Take by mouth. What changed: Another medication with the same name was removed. Continue taking this medication, and follow the directions you see here. Changed by: Leah Skora Martinique, MD  GLUCOSAMINE CHOND MSM FORMULA PO Take 2 capsules by mouth daily.   hydrALAZINE 25 MG tablet Commonly known as: APRESOLINE TAKE ONE TABLET BY MOUTH THREE TIMES A DAY   isosorbide mononitrate 30 MG 24 hr tablet Commonly known as: IMDUR TAKE ONE TABLET BY MOUTH DAILY   lisinopril 40 MG tablet Commonly known as: ZESTRIL TAKE ONE TABLET BY MOUTH DAILY   metFORMIN 500 MG 24 hr tablet Commonly known as: GLUCOPHAGE-XR Take 2 tablets by mouth 2 (two) times daily. Take 2 tabs twice daily   metoprolol succinate 100 MG 24 hr tablet Commonly known as: TOPROL-XL TAKE ONE TABLET BY MOUTH DAILY WITH OR  IMMEDIATELY FOLLOWING A MEAL; **MUST CALL MD FOR APPOINTMENT FOR FURTHER REFILLS   multivitamin with minerals Tabs tablet Take 1 tablet by mouth daily.   nitroGLYCERIN 0.4 MG SL tablet Commonly known as: Nitrostat PLACE 1 TABLET (0.4 MG TOTAL) UNDER THE TONGUE EVERY 5  MINUTES AS NEEDED FOR CHEST PAIN.   ONE TOUCH ULTRA TEST test strip Generic drug: glucose blood   onetouch ultrasoft lancets   warfarin 5 MG tablet Commonly known as: COUMADIN Take as directed by the anticoagulation clinic. If you are unsure how to take this medication, talk to your nurse or doctor. Original instructions: TAKE ONE TO ONE AND ONE-HALF TABLETS BY MOUTH DAILY AS DIRECTED BY COUMADIN CLINIC        Allergies  Allergen Reactions   Cephalexin Rash    Past Medical History:  Diagnosis Date   Chest pain July 2012   s/p repeat cath; stable anatomy. Managed medically   Coronary artery disease    Current use of long term anticoagulation    Diabetes mellitus    Dyslipidemia    Edema    Full dentures    Gout    History of acute myocardial infarction 11/2005   acute anteroseptal myocardial infarction   Hyperlipidemia    Hypertension    Osteoarthritis    Paroxysmal atrial fibrillation [facility])    Wears glasses     Past Surgical History:  Procedure Laterality Date   CARDIAC CATHETERIZATION  05/18/2009   CARDIAC CATHETERIZATION  July 2012   Stable anatomy. Manage medically   COLONOSCOPY     CORONARY ARTERY BYPASS GRAFT  2005   DEBRIDEMENT AND CLOSURE WOUND Left 06/18/2014   Procedure: CLOSURE OF OPEN WOUND OF LEFT CHEEK/POSSIBLE CHEEK ADVANCEMENT/ROTATION;  Surgeon: Theodoro Kos, DO;  Location: [facility];  Service: Plastics;  Laterality: Left;   INCISION AND DRAINAGE OF WOUND Left 07/17/2014   Procedure: IRRIGATION AND DEBRIDEMENT LEFT CHEEK WOUND WITH PLACEMENT OF ACELL;  Surgeon: Theodoro Kos, DO;  Location: [place];  Service: Plastics;  Laterality: Left;    TONSILLECTOMY      Social History   Tobacco Use  Smoking Status Former   Packs/day: 3.00   Years: 30.00   Total pack years: 90.00   Types: Cigarettes   Start date: 09/28/1962   Quit date: 1989   Years since quitting: 34.6  Smokeless Tobacco Never    Social History   Substance and Sexual Activity  Alcohol Use No    Family History  Problem Relation Age of Onset   Coronary artery disease Mother    Heart disease Mother    Coronary artery disease Father    Heart disease Father    Cancer Brother    Cancer Brother     Review of Systems: The review of systems is as above.  All other  systems were reviewed and are negative.  Physical Exam: BP 128/74   Pulse 78   Ht '5\' 9"'$  (1.753 m)   Wt 199 lb 6.4 oz (90.4 kg)   SpO2 95%   BMI 29.45 kg/m  GENERAL:  Well appearing WM in NAD HEENT:  PERRL, EOMI, sclera are clear. Oropharynx is clear. NECK:  No jugular venous distention, carotid upstroke brisk and symmetric, no bruits, no thyromegaly or adenopathy LUNGS:  Clear to auscultation bilaterally CHEST:  Unremarkable HEART:  IRRR,  PMI not displaced or sustained,S1 and S2 within normal limits, no S3, no S4: no clicks, no rubs, no murmurs ABD:  Soft, nontender. BS +, no masses or bruits. No hepatomegaly, no splenomegaly EXT:  2 + pulses throughout, 1+ edema- support hose in place, no cyanosis no clubbing SKIN:  Warm and dry.  No rashes NEURO:  Alert and oriented x 3. Cranial nerves II through XII intact. PSYCH:  Cognitively intact  LABORATORY DATA:    Lab Results  Component Value Date   WBC 8.7 10/28/2014   HGB 14.9 10/28/2014   HCT 44.0 10/28/2014   PLT 220 10/28/2014   GLUCOSE 128 (H) 10/28/2014   CHOL 135 04/02/2012   TRIG 130.0 04/02/2012   HDL 41.30 04/02/2012   LDLCALC 68 04/02/2012   ALT 28 03/18/2011   AST 27 03/18/2011   NA 136 10/28/2014   K 3.7 10/28/2014   CL 102 10/28/2014   CREATININE 0.95 10/28/2014   BUN 16 10/28/2014   CO2 26 10/28/2014   TSH  0.882 03/19/2011   INR 2.4 03/21/2022   HGBA1C (H) 05/16/2009    6.3 (NOTE) The ADA recommends the following therapeutic goal for glycemic control related to Hgb A1c measurement: Goal of therapy: <6.5 Hgb A1c  Reference: American Diabetes Association: Clinical Practice Recommendations 2010, Diabetes Care, 2010, 33: (Suppl  1).     Labs dated 05/31/16: glucose 126. CMET normal. A1c 6.1%.  Dated 12/01/15: cholesterol 143, triglycerides 95, LDL 72, HDL 52. Dated 11/28/16: A1c 6.9%. Glucose 156, otherwise CMET normal. Cholesterol 142, triglycerides 207, HDL 40, LDL 61 Dated 06/01/17: A1c 7%. Glucose 141, cholesterol 120, triglycerides 151, HDL 41, LDL 49. CMET normal.  Dated 11/30/17: A1c 7.1%. Glucose 137. Other chemistries normal.  Dated 10/05/18: glucose 152, A1c 7.2%. otherwise CMET and CBC normal. Cholesterol 126, triglycerides 174, HDL 37, LDL 54.  Dated 04/05/19: glucose 204, A1c  7.3%. otherwise CMET normal Dated 10/23/19: cholesterol 120, triglycerides 198, HDL 35, LDL 53. A1c 7.4%. glucose 217 otherwise CMET normal. CBC normal Dated 04/24/20: A1c 7.2%. Glucose 140, otherwise CMET normal.  Dated 11/06/20: cholesterol 138, triglycerides 209, HDL 38, LDL 67. A1c 6.3%. CMET normal. Dated 05/08/21: cholesterol 139, triglycerides 181, HDL 42, LDL 67, Dated 08/07/21: A1c 6.6%. sodium 147. Otherwise CMET and CBC normal Dated 03/11/22: glucose 220, otherwise CMET. WBC 11.8 otherwise normal CBC. A1c 6.8%. cholesterol 124, triglycerides 127, HDL 43, LDL  53.  Ecg today shows Afib with rate 78 bpm. Nonspecific ST abnormality. I have personally reviewed and interpreted this study.   Assessment / Plan:  1. Coronary disease status post CABG in 2005. Vein graft to the OM is known to be occluded. He is asymptomatic on medical therapy. Last Myoview in 2018 without ischemia and normal EF.  He will continue with his medical therapy.  2. Atrial fibrillation/flutter- paroxysmal. He is on chronic Coumadin therapy. Rate  is well controlled when in Afib. He is asymptomatic.  Continue current therapy  3. Hyperlipidemia. On  statin therapy and fish oil. Excellent control of LDL. Encouraged weight loss.   4. HTN.  BP is well controlled now.  5. 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 Last A1c 6.8%. encourage weight loss.   6. Edema. Chronic.  Continue lasix, elevation, support hose, and sodium restriction. Stable on exam today  8. OSA- followed by pulmonary - has not required CPAP yet.   9. 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 Will monitor.   I will follow up in 6 months.

## 2022-04-19 ENCOUNTER — Ambulatory Visit (INDEPENDENT_AMBULATORY_CARE_PROVIDER_SITE_OTHER): Payer: Medicare Other | Admitting: Cardiology

## 2022-04-19 ENCOUNTER — Encounter: Payer: Self-pay | Admitting: Cardiology

## 2022-04-19 VITALS — BP 128/74 | HR 78 | Ht 69.0 in | Wt 199.4 lb

## 2022-04-19 DIAGNOSIS — Z7901 Long term (current) use of anticoagulants: Secondary | ICD-10-CM | POA: Diagnosis not present

## 2022-04-19 DIAGNOSIS — I25708 Atherosclerosis of coronary artery bypass graft(s), unspecified, with other forms of angina pectoris: Secondary | ICD-10-CM

## 2022-04-19 DIAGNOSIS — I2510 Atherosclerotic heart disease of native coronary artery without angina pectoris: Secondary | ICD-10-CM

## 2022-04-19 DIAGNOSIS — I480 Paroxysmal atrial fibrillation: Secondary | ICD-10-CM

## 2022-04-19 DIAGNOSIS — I10 Essential (primary) hypertension: Secondary | ICD-10-CM

## 2022-04-19 DIAGNOSIS — E785 Hyperlipidemia, unspecified: Secondary | ICD-10-CM

## 2022-04-19 DIAGNOSIS — E782 Mixed hyperlipidemia: Secondary | ICD-10-CM

## 2022-04-19 MED ORDER — METOPROLOL SUCCINATE ER 100 MG PO TB24: ORAL_TABLET | ORAL | 3 refills | Status: AC

## 2022-05-02 ENCOUNTER — Ambulatory Visit: Payer: Medicare Other | Attending: Cardiology | Admitting: *Deleted

## 2022-05-02 DIAGNOSIS — I480 Paroxysmal atrial fibrillation: Secondary | ICD-10-CM | POA: Diagnosis not present

## 2022-05-02 DIAGNOSIS — Z7901 Long term (current) use of anticoagulants: Secondary | ICD-10-CM | POA: Diagnosis not present

## 2022-05-02 DIAGNOSIS — Z5181 Encounter for therapeutic drug level monitoring: Secondary | ICD-10-CM | POA: Diagnosis not present

## 2022-05-02 DIAGNOSIS — I4891 Unspecified atrial fibrillation: Secondary | ICD-10-CM | POA: Diagnosis not present

## 2022-05-02 LAB — POCT INR: INR: 3.2 — AB (ref 2.0–3.0)

## 2022-05-02 NOTE — Patient Instructions (Signed)
Description   Today take 1 tablet then continue taking Warfarin 1.5 tablets daily except 1 tablet on Sundays, Tuesdays and Thursdays.  Recheck in 5 weeks. Call us with any medication changes or concerns # [phone] [facility], Main # [phone].

## 2022-05-03 ENCOUNTER — Other Ambulatory Visit: Payer: Self-pay | Admitting: Pulmonary Disease

## 2022-05-06 ENCOUNTER — Other Ambulatory Visit: Payer: Self-pay | Admitting: Cardiology

## 2022-06-06 ENCOUNTER — Ambulatory Visit: Payer: Medicare Other | Attending: Family Medicine

## 2022-06-15 ENCOUNTER — Encounter (INDEPENDENT_AMBULATORY_CARE_PROVIDER_SITE_OTHER): Payer: Self-pay | Admitting: Family Medicine

## 2022-06-15 ENCOUNTER — Ambulatory Visit (INDEPENDENT_AMBULATORY_CARE_PROVIDER_SITE_OTHER): Payer: Medicare Other | Admitting: Family Medicine

## 2022-06-15 VITALS — BP 126/64 | HR 54 | Temp 98.5°F | Resp 14 | Ht 65.75 in | Wt 201.8 lb

## 2022-06-15 DIAGNOSIS — Z0000 Encounter for general adult medical examination without abnormal findings: Secondary | ICD-10-CM

## 2022-06-15 DIAGNOSIS — Z23 Encounter for immunization: Secondary | ICD-10-CM

## 2022-06-15 NOTE — Patient Instructions (Signed)
Eat healthy and avoid sugar and sweets as much as possible.   Stay active as much as you can.  Just don't fall down.   Come back in a month for diabetes recheck fasting for 9 hours except for water and black coffee.   Thank you, [name], MD

## 2022-06-15 NOTE — Progress Notes (Signed)
[facility]            Carl [name] is a 81 y.o. male who presents today for the following Medicare Wellness Visit:  []  Initial Preventive Physical Exam (IPPE) - "Welcome to Medicare" preventive visit (Vision Screening required)   []  Annual Wellness Visit - Initial  [x]  Annual Wellness Visit - Subsequent       Health Risk Assessment:   During the past month, how would you rate your general health?:  Good  Which of the following tasks can you do without assistance - drive or take the bus alone; shop for groceries or clothes; prepare your own meals; do your own housework/laundry; handle your own finances/pay bills; eat, bathe or get around your home?: Drive or take the bus alone, Handle your own finances/pay bills, Shop for groceries or clothes, Eat, bathe, dress or get around your home, Prepare your own meals, Do your own housework/laundry  Which of the following problems have you been bothered by in the past month - dizzy when standing up; problems using the phone; feeling tired or fatigued; moderate or severe body pain?: None of these  Do you exercise for about 20 minutes 3 or more days per week?:Yes  During the past month was someone available to help if you needed and wanted help?  For example, if you felt nervous, lonely, got sick and had to stay in bed, needed someone to talk to, needed help with daily chores or needed help just taking care of yourself.: Yes  Do you always wear a seat belt?: Yes  Do you have any trouble taking medications the way you have been told to take them?: No  Have you been given any information that can help you with keeping track of your medications?: No  Do you have trouble paying for your medications?: No  Have you been given any information that can help you with hazards in your house, such as scatter rugs, furniture, etc?: No  Do you feel unsteady when standing or walking?: No  Do you worry about falling?: No  Have you fallen two or more times in the  past year?: No  Did you suffer any injuries from your falls in the past year?: No     Care Team:   Patient Care Team:  Lakshmi Sundeen, [place], MD as PCP - General (Family Medicine)      Hospitalizations:   Hospitalization within past year: [x]  No  []  Yes     Diagnosis:      Screenings:       06/15/2022    11:13 AM 06/15/2022    11:26 AM   Ambulatory Screenings   Falls Risk: Fallen more than 2 times in past year N N   Falls Risk: Suffer any injuries? N N        Substance Use Disorder Screen:  In the past year, how often have you used the following?  1) Alcohol (For men, 5 or more drinks a day. For women, 4 or more drinks a day)  [x]  Never []  Once or Twice []  Monthly []  Weekly []  Daily or Almost Daily  2) Tobacco Products  [x]  Never []  Once or Twice []  Monthly []  Weekly []  Daily or Almost Daily  3) Prescription Drugs for Non-Medical Reasons  [x]  Never []  Once or Twice []  Monthly []  Weekly []  Daily or Almost Daily  4) Illegal Drugs  [x]  Never []  Once or Twice []  Monthly []  Weekly []  Daily or Almost Daily  Male Alcohol Risk Factor Screening >82yrs:   1)  Do you average more than 1 drink per night or more than 7 drinks a week:                no  2)  On any one occasion in the past three months, have you have had more than 4        drinks containing alcohol: no     Addiction Behaviors Checklist:  Instructions: Code only for patients prescribed opioid or sedative analgesics on behaviors exhibited ''since  last visit'' and ''within the current visit'' (NA = not assessed)    Addiction behaviors--since last visit:  1. Patient used illicit drugs or evidences problem drinking    []  Yes  []  No  []  NA  2. Patient has hoarded meds       []  Yes  []  No  []  NA  3. Patient used more narcotic than prescribed    []  Yes  []  No  []  NA  4. Patient ran out of meds early       []  Yes  []  No  []  NA  5. Patient has increased use of narcotics      []  Yes  []  No  []  NA  6. Patient used analgesics PRN when prescription      is for time  contingent use        []  Yes  []  No  []  NA  7. Patient received narcotics from more than one provider   []  Yes  []  No  []  NA  8. Patient bought meds on the streets     []  Yes  []  No  []  NA    Addiction behaviors--within current visit:  1. Patient appears sedated or confused       (e.g., slurred speech, unresponsive)    []  Yes  []  No  []  NA  2. Patient expresses worries about addiction               []  Yes  []  No  []  NA  3. Patient expressed a strong preference for a specific       type of analgesic or a specific route of administration   []  Yes  []  No  []  NA  4. Patient expresses concern about future availability of narcotic  []  Yes  []  No  []  NA  5. Patient reports worsened relationships with family  []  Yes  []  No  []  NA  6. Patient misrepresented analgesic prescription or use   []  Yes  []  No  []  NA  7. Patient indicated she or he ''needs'' or       ''must have'' analgesic meds      []  Yes  []  No  []  NA  8. Discussion of analgesic meds was the predominant       issue of visit        []  Yes  []  No  []  NA  9. Patient exhibited lack of interest in rehab or self-management []  Yes  []  No  []  NA   10. Patient reports minimal/inadequate relief        from narcotic analgesic       []  Yes  []  No  []  NA  11.Patient indicated difficulty with using medication agreement []  Yes  []  No  []  NA   Other   1. Significant others express concern over patient's   []  Yes  []  No  []  NA  use of analgesics     ABC Score: ______  Score of ?3 indicates possible inappropriate opioid use and should flag for further examination of specific signs of misuse and more careful patient monitoring (i.e., urine screening, pill counts, removal of opioid).  DAST-10:  Which recreation drugs have you used in past 12 months?   []  Methamphetamines (speed, crystal) []  Cannabis (marijuana, hash) []  Inhalants (paint thinner, aerosol, glue, etc.) []  Tranquilizers (valium) []  Cocaine (crack)   []  Narcotics (heroin, hydrocodone, etc.)    []  Other  __________________     These questions refer to the past 12 months  1. Have you used drugs other than those required for medical reasons? []  No (0 pt)       []  Yes (1 pt)  2. Do you abuse more than one drug at a time? []  No (0 pt) []  Yes (1 pt)  3. Are you always able to stop using drugs when you want to? (if never use drugs,  answer "yes") []  No (1 pt) []  Yes (0 pt)   4. Have you had "blackouts" or "flashbacks" as a result of drug use? []  No (0 pt) []  Yes  (1 pt)  5. Do you ever feel bad or guilty about your drug use? If never use drugs, choose "no."  []  No (0 pt) []  Yes (1 pt)   6. Does your spouse (or parent) ever complain about your involvement with drugs?       []  No (0 pt) []  Yes (1 pt)   7. Have you neglected your family because of your use of drugs? []  No (0 pt) []  Yes       (1 pt)  8. Have you engaged in illegal activities in order to obtain drugs? []  No (0 pt) []  Yes      (1 pt)  9. Have you ever experienced withdrawal symptoms (felt sick) when you stopped taking  drugs? []  No (0 pt) []  Yes (1 pt)  10. Have you had medical problems as a result of your drug use (e.g., memory loss)?        []  No (0 pt) []  Yes (1 pt)    Score:  Degree of Problems Related to Drug Abuse   Score: 0 No problems reported  Score: 1-2 Low Risk  Score: 3-5 Moderate Risk   Score: 6-10 High Risk     Functional Ability/Level of Safety:   Falls Risk/Home Safety Assessment:  ( see HRA and Screenings sections for additional assessment)  Home Safety: [x]  Stair handrails  []  Skid-resistant rugs/remove throw rugs   [x]  Grab bars  [x]  Clear pathways between rooms  [x]  Proper lighting stairs/ bathrooms/bedrooms  Get Up and Go (optional):  []   <20 secs  []   >20 secs    []   High risk for falls - Home Safety/Falls Risk Precautions reviewed with pt/family    Hearing Assessment:  Concerns for hearing loss: [x]  Yes  []   No  Hearing aids:   []   Right  []   Left  [x]   Bilateral   []   None  Whisper Test (optional):  []  Normal  []   Slightly decreased  []    Significantly decreased    Exercise:  Frequency:  []   No formal exercise  []   1-2x/wk  [x]   3-4x/wk  []   >4x/wk  Duration:  []   15-30 mins/day  [x]   30-45 mins/day  []   45+ mins/day  Intensity:  []   Light  [x]   Moderate  []   Heavy        Activities of Daily Living:   ADL's Independent Minimal  Assistance Moderate  Assistance Total   Assistance   Bathing [x]  []  []  []    Dressing [x]  []  []  []    Mobility   [x]  []  []  []    Transfer [x]  []  []  []    Eating [x]  []  []  []    Toileting [x]  []  []  []      IADL's Independent Minimal  Assistance Moderate  Assistance Total   Assistance   Phone [x]  []  []  []    Housekeeping [x]  []  []  []    Laundry [x]  []  []  []    Transportation [x]  []  []  []    Medications [x]  []  []  []    Finances [x]  []  []  []       ADL assistance: [x]  No assistance needed  []  Spouse  []  Sibling  []  Son   []  Daughter []  Children  []  Home Health Aide []  Other:       Advance Care Planning:   Discussion of Advance Directives:   []  Advance Directive in chart  []  Advance Directive not in chart - requested to provide []  No Advance Directive.  Form Provided  [x]  No Advance Directive.  Pt declines. []  Not addressed today  []  Other:     Exam:   BP 126/64 (BP Site: Right arm, Patient Position: Sitting, Cuff Size: Large)   Pulse (!) 54   Temp 98.5 F (36.9 C)   Resp 14   Ht 1.67 m (5' 5.75")   Wt 91.5 kg (201 lb 12.8 oz)   SpO2 95%   BMI 32.82 kg/m      Physical Exam     Vision Screening (Required for IPPE):     Uncorrected Near Uncorrected  Far   Right Eye 20/25 20/25   Left Eye 20/25 20/25   Both Eyes 20/25 20/25      Corrected  Near Corrected  Far   Right Eye 20/25 20/25   Left Eye 20/25 20/25   Both Eyes 20/25 20/25     []  Patient refused vision screening     Evaluation of Cognitive Function:   Mood/affect: [x]  Appropriate  []   Other:   Appearance: [x]  Neatly groomed  [x]  Adequately nourished  []  Other:  Family member/caregiver input: []  Present - no concerns  []   Not present in room  []  Present - concerns:    Cognitive  Assessment:  Mini-Cog Result (three word registration- banana, sunrise, chair / clock drawing):   [x]   > 3 points - negative screen for dementia   []  3 recalled words - negative screen for dementia   []  1-2 recalled words and normal clock draw - negative for cognitive impairment   []  1-2 recalled words and abnormal clock draw - positive for cognitive impairment   []  0 recalled words - positive for cognitive impairment    Mini-Cog Test:  AWV Mini-Cog Result:  1-2 recalled words and normal clock draw - negative for cognitive impairment         Assessment/Plan:   1. Medicare annual wellness visit, subsequent  - Flu vaccine HIGH-DOSE QUAD (PF) 65 yrs and older      Education/Counseling:  During the course of the visit, the patient was educated and counseled about appropriate screening and preventive services including:   Influenza vaccine     Maize Brittingham [name], MD    06/15/2022     The following sections were reviewed this encounter by the provider:   Tobacco  Allergies  Meds  Problems  Med Hx  Surg Hx  Fam Hx          History:   Patient Active Problem List   Diagnosis    Arteriosclerosis of coronary artery    Atrial flutter    Type 2 diabetes mellitus with other specified complication    Hyperlipidemia associated with type 2 diabetes mellitus    Hypertension associated with type 2 diabetes mellitus    Neuropathy due to type 2 diabetes mellitus    Obesity (BMI 30-39.9)    OSA (obstructive sleep apnea)    Subjective tinnitus of both ears      Past Medical History:   Diagnosis Date    Hypertension      Past Surgical History:   Procedure Laterality Date    CARDIAC SURGERY      TONSILLECTOMY       Allergies   Allergen Reactions    Cephalexin Rash      Outpatient Medications Marked as Taking for the 06/15/22 encounter (Office Visit) with Nihal Marzella Mounaf, MD   Medication Sig Dispense Refill    albuterol sulfate HFA (PROVENTIL) 108 (90 Base) MCG/ACT inhaler INHALE TWO PUFFS BY MOUTH EVERY 6 HOURS AS NEEDED FOR  WHEEZING OR SHORTNESS OF BREATH      allopurinol (ZYLOPRIM) 300 MG tablet Take 1 tablet (300 mg) by mouth daily      amLODIPine (NORVASC) 10 MG tablet Take 1 tablet (10 mg) by mouth daily      atorvastatin (LIPITOR) 40 MG tablet Take 1 tablet (40 mg) by mouth daily      cloNIDine (CATAPRES) 0.2 MG tablet Take 1 tablet (0.2 mg) by mouth 2 (two) times daily      furosemide (LASIX) 20 MG tablet Take 1 tablet (20 mg) by mouth daily      glipiZIDE (GLUCOTROL) 10 MG 24 hr tablet Take by mouth      glucose blood (OneTouch Ultra) test strip Use to test blood glucose daily as directed by your doctor ONE TOUCH ULTRA - Diagnosis Code E11.9      hydrALAZINE (APRESOLINE) 25 MG tablet Take 1 tablet (25 mg) by mouth 3 (three) times daily      isosorbide mononitrate (IMDUR) 30 MG 24 hr tablet Take 1 tablet (30 mg) by mouth daily      Lancets (OneTouch Delica Plus Lancet33G) Misc USE DAILY AS DIRECTED      lisinopril (ZESTRIL) 40 MG tablet Take 1 tablet (40 mg) by mouth daily      metFORMIN (GLUCOPHAGE-XR) 500 MG 24 hr tablet Take 2 tablets (1,000 mg) by mouth 2 (two) times daily      metoprolol succinate (TOPROL-XL) 100 MG 24 hr tablet TAKE ONE TABLET BY MOUTH DAILY WITH OR IMMEDIATELY FOLLOWING A MEAL; **MUST CALL MD FOR APPOINTMENT FOR FURTHER REFILLS      nitroglycerin (NITROSTAT) 0.4 MG SL tablet DISSOLVE 1 TAB UNDER TONGUE FOR CHEST PAIN - IF PAIN REMAINS AFTER 5 MIN, CALL 911 AND REPEAT DOSE. MAX 3 TABS IN 15 MINUTES      warfarin (COUMADIN) 5 MG tablet TAKE ONE TO ONE AND ONE-HALF TABLETS BY MOUTH DAILY AS DIRECTED BY COUMADIN CLINIC       Social History     Tobacco Use    Smoking status: Never   Vaping Use    Vaping Use: Never used   Substance Use Topics    Alcohol use: Not Currently    Drug use: Not Currently  Family History   Problem Relation Age of Onset    Cancer Brother            ===================================================================    Additional Documentation:     Chief Complaint:           HPI:              ROS:             Physical Exam:           Assessment/Plan:   1. Medicare annual wellness visit, subsequent  - Flu vaccine HIGH-DOSE QUAD (PF) 65 yrs and older      Amount and/or Complexity of Data Reviewed:  ===STRAIGHTFORWARD (minimal or none)===             Follow-up:   Return in about 1 month (around 07/16/2022) for Diabetes, HTN.       Rasheda Ledger [name], MD

## 2022-06-24 ENCOUNTER — Encounter (INDEPENDENT_AMBULATORY_CARE_PROVIDER_SITE_OTHER): Payer: Self-pay | Admitting: Family Medicine

## 2022-07-20 ENCOUNTER — Encounter (INDEPENDENT_AMBULATORY_CARE_PROVIDER_SITE_OTHER): Payer: Self-pay | Admitting: Family Medicine

## 2022-07-20 ENCOUNTER — Ambulatory Visit (INDEPENDENT_AMBULATORY_CARE_PROVIDER_SITE_OTHER): Payer: Medicare Other | Admitting: Family Medicine

## 2022-07-20 VITALS — BP 130/70 | HR 59 | Resp 16 | Ht 69.0 in | Wt 209.8 lb

## 2022-07-20 DIAGNOSIS — L2084 Intrinsic (allergic) eczema: Secondary | ICD-10-CM

## 2022-07-20 DIAGNOSIS — I4811 Longstanding persistent atrial fibrillation: Secondary | ICD-10-CM

## 2022-07-20 DIAGNOSIS — M170 Bilateral primary osteoarthritis of knee: Secondary | ICD-10-CM

## 2022-07-20 DIAGNOSIS — E1159 Type 2 diabetes mellitus with other circulatory complications: Secondary | ICD-10-CM

## 2022-07-20 DIAGNOSIS — I152 Hypertension secondary to endocrine disorders: Secondary | ICD-10-CM

## 2022-07-20 DIAGNOSIS — E785 Hyperlipidemia, unspecified: Secondary | ICD-10-CM

## 2022-07-20 DIAGNOSIS — E1169 Type 2 diabetes mellitus with other specified complication: Secondary | ICD-10-CM

## 2022-07-20 DIAGNOSIS — D0330 Melanoma in situ of unspecified part of face: Secondary | ICD-10-CM

## 2022-07-20 DIAGNOSIS — M160 Bilateral primary osteoarthritis of hip: Secondary | ICD-10-CM

## 2022-07-20 DIAGNOSIS — R053 Chronic cough: Secondary | ICD-10-CM

## 2022-07-20 LAB — COMPREHENSIVE METABOLIC PANEL
ALT: 29 U/L (ref 0–55)
AST (SGOT): 30 U/L (ref 5–41)
Albumin/Globulin Ratio: 1.3 (ref 0.9–2.2)
Albumin: 3.7 g/dL (ref 3.5–5.0)
Alkaline Phosphatase: 86 U/L (ref 37–117)
Anion Gap: 6 (ref 5.0–15.0)
BUN: 10 mg/dL (ref 9.0–28.0)
Bilirubin, Total: 0.8 mg/dL (ref 0.2–1.2)
CO2: 30 mEq/L — ABNORMAL HIGH (ref 17–29)
Calcium: 9.4 mg/dL (ref 7.9–10.2)
Chloride: 104 mEq/L (ref 99–111)
Creatinine: 0.8 mg/dL (ref 0.5–1.5)
Globulin: 2.9 g/dL (ref 2.0–3.6)
Glucose: 142 mg/dL — ABNORMAL HIGH (ref 70–100)
Potassium: 4.7 mEq/L (ref 3.5–5.3)
Protein, Total: 6.6 g/dL (ref 6.0–8.3)
Sodium: 140 mEq/L (ref 135–145)
eGFR: >60 mL/min/{1.73_m2} (ref >=60)

## 2022-07-20 LAB — CBC AND DIFFERENTIAL
Absolute NRBC: 0 10*3/uL (ref 0.00–0.00)
Basophils Absolute Automated: 0.04 10*3/uL (ref 0.00–0.08)
Basophils Automated: 0.4 %
Eosinophils Absolute Automated: 0.25 10*3/uL (ref 0.00–0.44)
Eosinophils Automated: 2.2 %
Hematocrit: 44 % (ref 37.6–49.6)
Hgb: 14.3 g/dL (ref 12.5–17.1)
Immature Granulocytes Absolute: 0.04 10*3/uL (ref 0.00–0.07)
Immature Granulocytes: 0.4 %
Instrument Absolute Neutrophil Count: 7.16 10*3/uL — ABNORMAL HIGH (ref 1.10–6.33)
Lymphocytes Absolute Automated: 2.83 10*3/uL (ref 0.42–3.22)
Lymphocytes Automated: 25.1 %
MCH: 32.1 pg (ref 25.1–33.5)
MCHC: 32.5 g/dL (ref 31.5–35.8)
MCV: 98.7 fL — ABNORMAL HIGH (ref 78.0–96.0)
MPV: 11.7 fL (ref 8.9–12.5)
Monocytes Absolute Automated: 0.97 10*3/uL — ABNORMAL HIGH (ref 0.21–0.85)
Monocytes: 8.6 %
Neutrophils Absolute: 7.16 10*3/uL — ABNORMAL HIGH (ref 1.10–6.33)
Neutrophils: 63.3 %
Nucleated RBC: 0 /100 WBC (ref 0.0–0.0)
Platelets: 248 10*3/uL (ref 142–346)
RBC: 4.46 10*6/uL (ref 4.20–5.90)
RDW: 14 % (ref 11–15)
WBC: 11.29 10*3/uL — ABNORMAL HIGH (ref 3.10–9.50)

## 2022-07-20 LAB — LIPID PANEL
Cholesterol / HDL Ratio: 3.1 Index
Cholesterol: 155 mg/dL (ref 0–199)
HDL: 50 mg/dL (ref 40–9999)
LDL Calculated: 88 mg/dL (ref 0–99)
Triglycerides: 86 mg/dL (ref 34–149)
VLDL Calculated: 17 mg/dL (ref 10–40)

## 2022-07-20 LAB — TSH: TSH: 1.17 u[IU]/mL (ref 0.35–4.94)

## 2022-07-20 LAB — HEMOLYSIS INDEX: Hemolysis Index: 13 Index (ref 0–24)

## 2022-07-20 LAB — MICROALBUMIN, RANDOM URINE
Urine Creatinine, Random: 41.2 mg/dL
Urine Microalbumin, Random: <5 ug/ml (ref 0.0–30.0)

## 2022-07-20 LAB — HEMOGLOBIN A1C
Average Estimated Glucose: 139.9 mg/dL
Hemoglobin A1C: 6.5 % — ABNORMAL HIGH (ref 4.6–5.6)

## 2022-07-20 NOTE — Patient Instructions (Signed)
Lab work today   Form filled out for Engineer, drilling.   Eat healthy and avoid sugar and sweets as much as possible.   Don't fall down.   Stay active as much as you can.  Thank you, [name], MD

## 2022-07-20 NOTE — Progress Notes (Signed)
Have you seen any specialists since your last visit with us?  No      The patient was informed that the following HM items are still outstanding:   nothing at this time, HM is up-to-date.

## 2022-07-20 NOTE — Progress Notes (Signed)
Subjective:      Date: 07/20/2022 9:11 AM   Patient ID: Carl [name] is a 81 y.o. male.    Chief Complaint:  Chief Complaint   Patient presents with    Follow Up       HPI:  Diabetes , and the patient is taking the medication daily without side effects.  No results found for: "HGBA1C"  No results found for: "GLU", "MICROALB", "LDL", "CREAT"  Hyperlipidemia , and the patient is taking the medication daily without side effects.  Right knee pain lateral   His son is with him today  Needs a cardiology, pulmonology and derm referrals.   History of a fib . On blood thinners.   Arthritis knees and hips.   Needs handicap form filled out.     Problem List:  Patient Active Problem List   Diagnosis    Arteriosclerosis of coronary artery    Atrial flutter    Type 2 diabetes mellitus with other specified complication    Hyperlipidemia associated with type 2 diabetes mellitus    Hypertension associated with type 2 diabetes mellitus    Neuropathy due to type 2 diabetes mellitus    Obesity (BMI 30-39.9)    OSA (obstructive sleep apnea)    Subjective tinnitus of both ears    Melanoma in situ of face    Arthritis of both knees    Bilateral hip joint arthritis    Intrinsic eczema    Chronic cough       Current Medications:  Outpatient Medications Marked as Taking for the 07/20/22 encounter (Office Visit) with Kamorah Nevils Mounaf, MD   Medication Sig Dispense Refill    acetaminophen (TYLENOL) 500 MG tablet Take 1 tablet (500 mg) by mouth every 6 (six) hours as needed      albuterol sulfate HFA (PROVENTIL) 108 (90 Base) MCG/ACT inhaler INHALE TWO PUFFS BY MOUTH EVERY 6 HOURS AS NEEDED FOR WHEEZING OR SHORTNESS OF BREATH      allopurinol (ZYLOPRIM) 300 MG tablet Take 1 tablet (300 mg) by mouth daily      amLODIPine (NORVASC) 10 MG tablet Take 1 tablet (10 mg) by mouth daily      Ascorbic Acid (vitamin C) 1000 MG tablet Take 1 tablet (1,000 mg) by mouth      atorvastatin (LIPITOR) 40 MG tablet Take 1 tablet (40 mg) by mouth daily       B Complex Vitamins (vitamin B complex) Tab Take 1 tablet by mouth daily      cloNIDine (CATAPRES) 0.2 MG tablet Take 1 tablet (0.2 mg) by mouth 2 (two) times daily      furosemide (LASIX) 20 MG tablet Take 1 tablet (20 mg) by mouth daily      glipiZIDE (GLUCOTROL) 10 MG 24 hr tablet Take by mouth      glucose blood (OneTouch Ultra) test strip Use to test blood glucose daily as directed by your doctor ONE TOUCH ULTRA - Diagnosis Code E11.9      hydrALAZINE (APRESOLINE) 25 MG tablet Take 1 tablet (25 mg) by mouth 3 (three) times daily      isosorbide mononitrate (IMDUR) 30 MG 24 hr tablet Take 1 tablet (30 mg) by mouth daily      Lancets (OneTouch Delica Plus Lancet33G) Misc USE DAILY AS DIRECTED      lisinopril (ZESTRIL) 40 MG tablet Take 1 tablet (40 mg) by mouth daily      metFORMIN (GLUCOPHAGE-XR) 500 MG 24 hr tablet Take 2 tablets (  1,000 mg) by mouth 2 (two) times daily      metoprolol succinate (TOPROL-XL) 100 MG 24 hr tablet TAKE ONE TABLET BY MOUTH DAILY WITH OR IMMEDIATELY FOLLOWING A MEAL; **MUST CALL MD FOR APPOINTMENT FOR FURTHER REFILLS      Multiple Vitamin (Multi-Vitamin) tablet Take 1 tablet by mouth daily      nitroglycerin (NITROSTAT) 0.4 MG SL tablet DISSOLVE 1 TAB UNDER TONGUE FOR CHEST PAIN - IF PAIN REMAINS AFTER 5 MIN, CALL 911 AND REPEAT DOSE. MAX 3 TABS IN 15 MINUTES      Omega-3 Fatty Acids (fish oil) 1000 MG Cap capsule Take 1 capsule (1,000 mg) by mouth 2 (two) times daily      warfarin (COUMADIN) 5 MG tablet TAKE ONE TO ONE AND ONE-HALF TABLETS BY MOUTH DAILY AS DIRECTED BY COUMADIN CLINIC         Allergies:  Allergies   Allergen Reactions    Cephalexin Rash       Past Medical History:  Past Medical History:   Diagnosis Date    Hypertension        Past Surgical History:  Past Surgical History:   Procedure Laterality Date    CARDIAC SURGERY      TONSILLECTOMY         Family History:  Family History   Problem Relation Age of Onset    Cancer Brother        Social History:  Social History      Tobacco Use    Smoking status: Never   Vaping Use    Vaping Use: Never used   Substance Use Topics    Alcohol use: Not Currently    Drug use: Not Currently        The following sections were reviewed this encounter by the provider:   Tobacco  Allergies  Meds  Problems  Med Hx  Surg Hx  Fam Hx         ROS:  Review of system was negative except for what was mentioned in HPI    Objective:     Vitals:  BP 130/70 (BP Site: Right arm, Patient Position: Sitting, Cuff Size: Large)   Pulse (!) 59   Resp 16   Ht 1.753 m (5\' 9" )   Wt 95.2 kg (209 lb 12.8 oz)   SpO2 96%   BMI 30.98 kg/m     Physical Exam:  Physical Exam  General Examination:  GENERAL APPEARANCE: alert, in no acute distress, well developed, well nourished, oriented to time, place, and person.   HEAD: normal appearance.   EYES: extraocular movement intact (EOMI), pupils equal, round, reactive to light and accommodation, sclera anicteric.   NECK/THYROID: neck supple,no lymphadenopathy, no thyromegaly.   SKIN: no rashes  HEART: S1, S2 normal, no murmurs, rubs, gallops, regular rate and rhythm.   LUNGS: normal effort / no distress, normal breath sounds, clear to auscultation bilaterally, no wheezes, rales, rhonchi.   ABDOMEN: bowel sounds present, no hepatosplenomegaly, soft, nontender, nondistended.   EXTREMITIES: no clubbing, cyanosis, or edema.   NEUROLOGIC: normal.   PSYCH: alert, oriented, cognitive function intact.  Tenderness lateral right knee with mild swelling behind the knee.     Assessment:       1. Type 2 diabetes mellitus with other specified complication, without long-term current use of insulin  - CBC and differential  - Comprehensive metabolic panel  - TSH  - Hemoglobin A1C  - Urine Microalbumin Random    2. Melanoma in  situ of face    3. Longstanding persistent atrial fibrillation  - Ambulatory referral to Cardiology; Future    4. Hyperlipidemia associated with type 2 diabetes mellitus  - Lipid panel  - Ambulatory referral to  Cardiology; Future    5. Hypertension associated with type 2 diabetes mellitus  - CBC and differential  - Comprehensive metabolic panel    6. Chronic cough  - Referral to Pulmonology; Future    7. Intrinsic eczema  - Ambulatory referral to Dermatology; Future    8. Arthritis of both knees    9. Bilateral hip joint arthritis        Plan:   Please see patient instructions sheet for plan.       Follow-Up:   Return in about 3 months (around 10/20/2022) for Diabetes, HTN.    Patient Instructions   Lab work today   Form filled out for handicap sticker.   Eat healthy and avoid sugar and sweets as much as possible.   Don't fall down.   Stay active as much as you can.  Thank you, [name], MD     Bartlett Enke [name], MD

## 2022-07-21 ENCOUNTER — Telehealth (INDEPENDENT_AMBULATORY_CARE_PROVIDER_SITE_OTHER): Payer: Self-pay | Admitting: Cardiovascular Disease

## 2022-07-21 NOTE — Telephone Encounter (Signed)
Called pt in regard to a referral I received by primary care provider indicating  pt need to see a cardiologist. Please call me my extension # is [phone] or place pt on day DR. Tehrani Is av available   okay to double book - per DR.Tehrani

## 2022-08-01 ENCOUNTER — Telehealth (INDEPENDENT_AMBULATORY_CARE_PROVIDER_SITE_OTHER): Payer: Self-pay | Admitting: Cardiovascular Disease

## 2022-08-01 NOTE — Telephone Encounter (Signed)
Patient:  Carl [name]   Patient Phone Number:  Preferred: [phone] / Other: [phone] (home)     Appointment Date: 09/01/2022   Cardiologist: Urban Gibson, MD    Location: [facility]    Diagnosis:   Longstanding persistent atrial fibrillation [I48.11]  Hyperlipidemia associated with type 2 diabetes mellitus [E11.69, E78.5]    Referring MD:    Dr. Jalene Mullet   Referring MD Phone Number:  [phone]

## 2022-08-05 ENCOUNTER — Telehealth: Payer: Self-pay | Admitting: Cardiology

## 2022-08-05 NOTE — Telephone Encounter (Signed)
Left detailed message on patient's personal phone that he can contact [facility] to have his medical information sent to his new physician.

## 2022-08-05 NOTE — Telephone Encounter (Signed)
Patient states he recently moved and he is requesting to have his medical records transferred to his new doctor, Benjaman Lobe, MD. Please return call to patient to confirm. Patient provided a phone and fax number for Dr. Kellie Moor office: Phone#: [phone] Fax#: [phone]

## 2022-08-08 ENCOUNTER — Other Ambulatory Visit (INDEPENDENT_AMBULATORY_CARE_PROVIDER_SITE_OTHER): Payer: Self-pay | Admitting: Family Medicine

## 2022-08-08 ENCOUNTER — Other Ambulatory Visit (INDEPENDENT_AMBULATORY_CARE_PROVIDER_SITE_OTHER): Payer: Self-pay

## 2022-08-08 ENCOUNTER — Telehealth (INDEPENDENT_AMBULATORY_CARE_PROVIDER_SITE_OTHER): Payer: Self-pay | Admitting: Family Medicine

## 2022-08-08 MED ORDER — GLIPIZIDE ER 10 MG PO TB24
10.0000 mg | ORAL_TABLET | Freq: Every day | ORAL | 3 refills | Status: DC
Start: 2022-08-08 — End: 2023-07-12

## 2022-08-08 NOTE — Progress Notes (Signed)
THE PRESCRIPTION/S WILL BE SENT TO THE PHARMACY..

## 2022-08-08 NOTE — Telephone Encounter (Signed)
Patient came in requesting a refill of glipiZIDE (GLUCOTROL) 10 MG 24 hr tablet  to be sent to the [facility] in his chart. He states that he discussed with PCP about getting refill and is took his last pill today. Please advise,     Cb# [phone]

## 2022-08-15 ENCOUNTER — Encounter (INDEPENDENT_AMBULATORY_CARE_PROVIDER_SITE_OTHER): Payer: Self-pay | Admitting: Family Medicine

## 2022-08-16 ENCOUNTER — Telehealth: Payer: Self-pay | Admitting: Cardiology

## 2022-08-16 DIAGNOSIS — E785 Hyperlipidemia, unspecified: Secondary | ICD-10-CM

## 2022-08-16 DIAGNOSIS — Z7901 Long term (current) use of anticoagulants: Secondary | ICD-10-CM

## 2022-08-16 DIAGNOSIS — I480 Paroxysmal atrial fibrillation: Secondary | ICD-10-CM

## 2022-08-16 DIAGNOSIS — I2510 Atherosclerotic heart disease of native coronary artery without angina pectoris: Secondary | ICD-10-CM

## 2022-08-16 DIAGNOSIS — I10 Essential (primary) hypertension: Secondary | ICD-10-CM

## 2022-08-16 MED ORDER — ISOSORBIDE MONONITRATE ER 30 MG PO TB24: 30.0000 mg | ORAL_TABLET | Freq: Every day | ORAL | 3 refills | Status: AC

## 2022-08-16 MED ORDER — CLONIDINE HCL 0.2 MG PO TABS: 0.2000 mg | ORAL_TABLET | Freq: Two times a day (BID) | ORAL | 3 refills | Status: AC

## 2022-08-16 MED ORDER — ATORVASTATIN CALCIUM 40 MG PO TABS: 40.0000 mg | ORAL_TABLET | Freq: Every day | ORAL | 3 refills | Status: AC

## 2022-08-16 MED ORDER — LISINOPRIL 40 MG PO TABS: 40.0000 mg | ORAL_TABLET | Freq: Every day | ORAL | 3 refills | Status: AC

## 2022-08-16 MED ORDER — AMLODIPINE BESYLATE 10 MG PO TABS: 10.0000 mg | ORAL_TABLET | Freq: Every day | ORAL | 3 refills | Status: AC

## 2022-08-16 NOTE — Telephone Encounter (Signed)
*  STAT* If patient is at the pharmacy, call can be transferred to refill team.   1. Which medications need to be refilled? (please list name of each medication and dose if known)  atorvastatin (LIPITOR) 40 MG tablet  isosorbide mononitrate (IMDUR) 30 MG 24 hr tablet  lisinopril (ZESTRIL) 40 MG tablet   cloNIDine (CATAPRES) 0.2 MG tablet    amLODipine (NORVASC) 10 MG tablet   2. Which pharmacy/location (including street and city if local pharmacy) is medication to be sent to?  [facility] [id number] - [place], [address]    3. Do they need a 30 day or 90 day supply? [place]

## 2022-08-18 ENCOUNTER — Other Ambulatory Visit (INDEPENDENT_AMBULATORY_CARE_PROVIDER_SITE_OTHER): Payer: Self-pay | Admitting: Family Medicine

## 2022-08-18 DIAGNOSIS — E1169 Type 2 diabetes mellitus with other specified complication: Secondary | ICD-10-CM

## 2022-08-18 MED ORDER — ONETOUCH DELICA PLUS LANCET33G MISC
1.0000 [IU] | Freq: Every day | 5 refills | Status: DC
Start: 2022-08-18 — End: 2022-11-17

## 2022-08-18 MED ORDER — ONETOUCH ULTRA VI STRP
1.0000 | ORAL_STRIP | Freq: Every day | 5 refills | Status: DC
Start: 2022-08-18 — End: 2023-09-10

## 2022-08-18 MED ORDER — METFORMIN HCL ER 500 MG PO TB24
1000.0000 mg | ORAL_TABLET | Freq: Two times a day (BID) | ORAL | 0 refills | Status: DC
Start: 2022-08-18 — End: 2022-11-24

## 2022-08-18 NOTE — Telephone Encounter (Signed)
Name, strength, directions of requested refill(s):    Lancets (OneTouch Delica Plus Lancet33G) Misc     glucose blood (OneTouch Ultra) test strip     metFORMIN (GLUCOPHAGE-XR) 500 MG 24 hr tablet       How much medication is remaining: none     Pharmacy to send refill to or patient to pick up rx from office (mark requested pharmacy in BOLD):      [facility] [id number] [place], [place] - [address]  [phone]  [place] [address]  Phone: [phone] Fax: [phone]        Please mark "X" next to the preferred call back number:    Mobile: [phone] (mobile) X    Home: [phone] (home)    Work: @WORKPHONE @        Medication refill request, see above. Thank you   Patient has been informed that medication refill requests should be called in up to one week prior to running out of medication.    Additional Notes:  Next Visit: MM/DD/YY

## 2022-09-01 ENCOUNTER — Other Ambulatory Visit (INDEPENDENT_AMBULATORY_CARE_PROVIDER_SITE_OTHER): Payer: Medicare Other

## 2022-09-01 ENCOUNTER — Encounter (INDEPENDENT_AMBULATORY_CARE_PROVIDER_SITE_OTHER): Payer: Self-pay | Admitting: Cardiovascular Disease

## 2022-09-01 ENCOUNTER — Other Ambulatory Visit (FREE_STANDING_LABORATORY_FACILITY): Payer: Medicare Other

## 2022-09-01 ENCOUNTER — Ambulatory Visit (INDEPENDENT_AMBULATORY_CARE_PROVIDER_SITE_OTHER): Payer: Medicare Other | Admitting: Cardiovascular Disease

## 2022-09-01 VITALS — BP 138/71 | HR 71 | Ht 69.0 in | Wt 208.0 lb

## 2022-09-01 DIAGNOSIS — G4733 Obstructive sleep apnea (adult) (pediatric): Secondary | ICD-10-CM

## 2022-09-01 DIAGNOSIS — Z951 Presence of aortocoronary bypass graft: Secondary | ICD-10-CM

## 2022-09-01 DIAGNOSIS — E669 Obesity, unspecified: Secondary | ICD-10-CM

## 2022-09-01 DIAGNOSIS — I2510 Atherosclerotic heart disease of native coronary artery without angina pectoris: Secondary | ICD-10-CM

## 2022-09-01 DIAGNOSIS — Z136 Encounter for screening for cardiovascular disorders: Secondary | ICD-10-CM

## 2022-09-01 DIAGNOSIS — E1169 Type 2 diabetes mellitus with other specified complication: Secondary | ICD-10-CM

## 2022-09-01 DIAGNOSIS — R0602 Shortness of breath: Secondary | ICD-10-CM

## 2022-09-01 DIAGNOSIS — I480 Paroxysmal atrial fibrillation: Secondary | ICD-10-CM

## 2022-09-01 DIAGNOSIS — E785 Hyperlipidemia, unspecified: Secondary | ICD-10-CM

## 2022-09-01 LAB — CBC AND DIFFERENTIAL
Absolute NRBC: 0 10*3/uL (ref 0.00–0.00)
Basophils Absolute Automated: 0.04 10*3/uL (ref 0.00–0.08)
Basophils Automated: 0.4 %
Eosinophils Absolute Automated: 0.11 10*3/uL (ref 0.00–0.44)
Eosinophils Automated: 1.1 %
Hematocrit: 42.7 % (ref 37.6–49.6)
Hgb: 14.5 g/dL (ref 12.5–17.1)
Immature Granulocytes Absolute: 0.04 10*3/uL (ref 0.00–0.07)
Immature Granulocytes: 0.4 %
Instrument Absolute Neutrophil Count: 6.16 10*3/uL (ref 1.10–6.33)
Lymphocytes Absolute Automated: 2.88 10*3/uL (ref 0.42–3.22)
Lymphocytes Automated: 28.8 %
MCH: 31.7 pg (ref 25.1–33.5)
MCHC: 34 g/dL (ref 31.5–35.8)
MCV: 93.2 fL (ref 78.0–96.0)
MPV: 11.1 fL (ref 8.9–12.5)
Monocytes Absolute Automated: 0.78 10*3/uL (ref 0.21–0.85)
Monocytes: 7.8 %
Neutrophils Absolute: 6.16 10*3/uL (ref 1.10–6.33)
Neutrophils: 61.5 %
Nucleated RBC: 0 /100 WBC (ref 0.0–0.0)
Platelets: 268 10*3/uL (ref 142–346)
RBC: 4.58 10*6/uL (ref 4.20–5.90)
RDW: 13 % (ref 11–15)
WBC: 10.01 10*3/uL — ABNORMAL HIGH (ref 3.10–9.50)

## 2022-09-01 LAB — ECG 12-LEAD
Atrial Rate: 71 {beats}/min
IHS MUSE NARRATIVE AND IMPRESSION: NORMAL
P Axis: 67 degrees
P-R Interval: 198 ms
Q-T Interval: 408 ms
QRS Duration: 82 ms
QTC Calculation (Bezet): 443 ms
R Axis: 55 degrees
T Axis: 40 degrees
Ventricular Rate: 71 {beats}/min

## 2022-09-01 LAB — PT/INR
PT INR: 3.4 — ABNORMAL HIGH (ref 0.9–1.1)
PT: 39.6 s — ABNORMAL HIGH (ref 10.1–12.9)

## 2022-09-01 LAB — BASIC METABOLIC PANEL
Anion Gap: 8 (ref 5.0–15.0)
BUN: 15 mg/dL (ref 9.0–28.0)
CO2: 29 mEq/L (ref 17–29)
Calcium: 10 mg/dL (ref 7.9–10.2)
Chloride: 104 mEq/L (ref 99–111)
Creatinine: 1 mg/dL (ref 0.5–1.5)
Glucose: 104 mg/dL — ABNORMAL HIGH (ref 70–100)
Potassium: 4.7 mEq/L (ref 3.5–5.3)
Sodium: 141 mEq/L (ref 135–145)
eGFR: >60 mL/min/{1.73_m2} (ref >=60)

## 2022-09-01 LAB — HEMOLYSIS INDEX: Hemolysis Index: 10 Index (ref 0–24)

## 2022-09-01 LAB — HEPATIC FUNCTION PANEL (LFT)
ALT: 32 U/L (ref 0–55)
AST (SGOT): 28 U/L (ref 5–41)
Albumin/Globulin Ratio: 1.1 (ref 0.9–2.2)
Albumin: 3.7 g/dL (ref 3.5–5.0)
Alkaline Phosphatase: 98 U/L (ref 37–117)
Bilirubin Direct: 0.2 mg/dL (ref 0.0–0.5)
Bilirubin Indirect: 0.4 mg/dL (ref 0.2–1.0)
Bilirubin, Total: 0.6 mg/dL (ref 0.2–1.2)
Globulin: 3.5 g/dL (ref 2.0–3.6)
Protein, Total: 7.2 g/dL (ref 6.0–8.3)

## 2022-09-01 LAB — LIPID PANEL
Cholesterol / HDL Ratio: 3.9 Index
Cholesterol: 147 mg/dL (ref 0–199)
HDL: 38 mg/dL — ABNORMAL LOW (ref 40–9999)
LDL Calculated: 79 mg/dL (ref 0–99)
Triglycerides: 150 mg/dL — ABNORMAL HIGH (ref 34–149)
VLDL Calculated: 30 mg/dL (ref 10–40)

## 2022-09-01 LAB — MAGNESIUM: Magnesium: 1.8 mg/dL (ref 1.6–2.6)

## 2022-09-01 MED ORDER — NITROGLYCERIN 0.4 MG SL SUBL
0.4000 mg | SUBLINGUAL_TABLET | SUBLINGUAL | 5 refills | Status: DC | PRN
Start: 2022-09-01 — End: 2023-09-11

## 2022-09-01 MED ORDER — WARFARIN SODIUM 5 MG PO TABS
5.0000 mg | ORAL_TABLET | Freq: Every day | ORAL | 0 refills | Status: DC
Start: 2022-09-01 — End: 2022-12-01

## 2022-09-01 MED ORDER — WARFARIN SODIUM 5 MG PO TABS
5.0000 mg | ORAL_TABLET | Freq: Every day | ORAL | 0 refills | Status: DC
Start: 2022-09-01 — End: 2022-09-01

## 2022-09-01 NOTE — Patient Instructions (Addendum)
Nuclear stress test  Echo  Heart monitor  Let me know if you want to change to eliquis  For now Dr. Flonnie Overman will manage coumadin  Goal blood pressure less than 130/80, see instructions below  Goal HgB A1C <7  Goal to lose 10 lbs in next 6 months, see below  Dr. Carney Living, pulmonary medicine  Follow up 3 months with nurse practitioner  Blood work today      Check blood pressure once a day for 7 to 10 days, after relaxing for 5 minutes and than 2-3 minutes later repeat again.      When you check your blood pressure once a day, change the time of day from previous days so we can understand if you blood pressure fluctuates during the day.    Your ideal goal blood pressure is less than 130/80 mm Hg as long as you are not dizzy or tired at this blood pressure.    Call if your average blood pressure is higher than 130/80 to adjust your medications    You can purchase an Omron blood pressure cuff for your upper arm if you do not have one.    Low salt diet less than 2000 mg per day.    Avoid alcohol which raises blood pressure    Weight loss reduces blood pressure        Mediterranean diet  Lose 10 lbs of body weight in next 6 months  Try to do light to moderate exercise daily, such as recumbent bike and gradually to build up to one half hour a day      Understanding the Mediterranean Diet  A Mediterranean-style diet is a healthy way of eating, not a specific diet to lose weight. It includes a lot of foods from plants such as vegetables, fruits, and whole grains, plus olive oil and seafood. It also includes dairy foods and meats, but in smaller amounts. And it includes a moderate amount of wine. Many studies over time have shown health benefits to eating this way. It focuses on making fresh food that's full of flavor.  This plan of eating is inspired by how people eat in countries around the [employer]. They include [place], [place], [place], [place], [place], and [place]. But it uses foods you can buy in almost any  grocery store.  Health benefits of a Mediterranean diet  This diet is high in fiber, lean protein, and healthy oils. It's low in saturated fats and sugar. The diet has been shown to help prevent or manage:  Depression  Diabetes  Heart disease  High blood pressure  Parkinson disease  Alzheimer disease  Cancers of the colon, prostate, and breast  What do I eat on a Mediterranean diet?  Plan each meal around vegetables and whole grains. Use olive oil. Add nuts and legumes. Include fish or lean protein. Foods to focus your meals around include:  Food type What to eat   Vegetables This includes leafy greens, tomatoes, squash, peppers, cucumbers, green beans, eggplant, avocados, potatoes, and olives. You can use fresh or frozen vegetables.   Fruits This includes apples, raspberries, strawberries, grapes, citrus fruit such as oranges and grapefruit, stone fruit such as apricots and peaches, plus figs, dates, and melon.   Whole grains This includes brown rice, whole oats, quinoa, millet, whole grain bread, whole-wheat pasta, and crackers made with whole grains.   Beans and legumes These include lentils, chickpeas, and beans such as pinto, fava, kidney, and black beans. Peanuts are also  legumes.   Seafood This includes fish such as salmon, trout, mackerel, haddock, tuna, sardines, anchovies, and whitefish. It also includes shellfish such as shrimp, oysters, mussels, and clams.   Nuts and seeds These include walnuts, almonds, sunflower seeds, cashews, [place] nuts, and pecans.   Healthy oil Olive oil is the most common oil in the Mediterranean diet. But other healthy oils are canola, sunflower, safflower, and corn oils.   Herbs and spices Season food with oregano, pepper, sage, tarragon, thyme, basil, cinnamon, and cumin.   Wine Enjoy a glass of wine each day with a meal. Skip this if you need to not have alcohol for any reason.   Foods to eat in smaller amounts  You can add these foods in a few days a week, in smaller  amounts:  Dairy foods, such as cheese, yogurt, and butter  Poultry, such as chicken and duck  Eggs  Occasional treats  Limit these foods in your weekly eating plan:  Red meats, such as beef, lamb, and pork  Refined grains, such as white rice and foods made with white flour  Sugary treats, such as chocolate, candy, or pastries  Adding lots of flavor  You can liven up fresh foods with many kinds of flavor. Try these sauces, dips, and seasonings:  Hummus  Marinara sauce  Salsa  Vinaigrette dressing  Meal planning  You can find Mediterranean diet grocery lists and recipes at krameshealthyconnections.com/medstyle.  Tips for eating out  At restaurants:  Skip fried foods. These have a lot of saturated fat.  Look for fish dishes that are cooked without cream or butter.  Pick salads that have nuts and seeds.  Choose vegetarian options that don't have too much cheese.  StayWell last reviewed this educational content on 01/04/2019   2000-2020 [employer], [place]. [address] [place], [place] [address]. All rights reserved. This information is not intended as a substitute for professional medical care. Always follow your healthcare professional's instructions.      Recommend healthy diet, limiting fried/fatty foods, processed foods, saturated/trans saturated fats, and sugary foods. Engage in at least 150 minutes of moderate-intensity exercise weekly. 7-8 hours of sleep is recommended

## 2022-09-01 NOTE — Progress Notes (Signed)
[name] Consultation      Referring Physician: Flonnie Overman, [place], MD    I had the pleasure of seeing Carl [name] today for assumption of CV care. [place]  He is a 81 y.o. with a history of PAF.  CABG 2005, had heart burn in chest.     Tums usually relieves his heart burn episgastric.  Last few years sob occasionally at rest, and resolved with inhaler.  No cp or sob with yoga,  Walks with a cane and can walk a block.  No cp or sob walking in from parking lot.  No syncope.  No dizziness.  Rare single skipped beat.  A week ago had heart burn after a meal with hot sauce.  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, small MI  No CHF  No CVA  Had DM, HTN, PAF, HLD    PAST SURGICAL HISTORY:   Past Surgical History:   Procedure Laterality Date    CARDIAC SURGERY      TONSILLECTOMY         MEDICATIONS:   Current Outpatient Medications:     acetaminophen (TYLENOL) 500 MG tablet, Take 1 tablet (500 mg) by mouth every 6 (six) hours as needed, Disp: , Rfl:     albuterol sulfate HFA (PROVENTIL) 108 (90 Base) MCG/ACT inhaler, INHALE TWO PUFFS BY MOUTH EVERY 6 HOURS AS NEEDED FOR WHEEZING OR SHORTNESS OF BREATH, Disp: , Rfl:     allopurinol (ZYLOPRIM) 300 MG tablet, Take 1 tablet (300 mg) by mouth daily, Disp: , Rfl:     amLODIPine (NORVASC) 10 MG tablet, Take 1 tablet (10 mg) by mouth daily, Disp: , Rfl:     Ascorbic Acid (vitamin C) 1000 MG tablet, Take 1 tablet (1,000 mg) by mouth, Disp: , Rfl:     atorvastatin (LIPITOR) 40 MG tablet, Take 1 tablet (40 mg) by mouth daily, Disp: , Rfl:     B Complex Vitamins (vitamin B complex) Tab, Take 1 tablet by mouth daily, Disp: , Rfl:     cloNIDine (CATAPRES) 0.2 MG tablet, Take 1 tablet (0.2 mg) by mouth 2 (two) times daily, Disp: , Rfl:     furosemide (LASIX) 20 MG tablet, Take 1 tablet (20 mg) by mouth daily, Disp: , Rfl:     glipiZIDE  (GLUCOTROL) 10 MG 24 hr tablet, Take 1 tablet (10 mg) by mouth daily, Disp: 90 tablet, Rfl: 3    glucose blood (OneTouch Ultra) test strip, 1 each by Other route daily Use as instructed, Disp: 100 strip, Rfl: 5    hydrALAZINE (APRESOLINE) 25 MG tablet, Take 1 tablet (25 mg) by mouth 3 (three) times daily Takes as needed, Disp: , Rfl:     isosorbide mononitrate (IMDUR) 30 MG 24 hr tablet, Take 1 tablet (30 mg) by mouth daily, Disp: , Rfl:     Lancets (OneTouch Delica Plus Lancet33G) Misc, Inject 1 Unit into the skin daily USE DAILY AS DIRECTED, Disp: 100 each, Rfl: 5    lisinopril (ZESTRIL) 40 MG tablet, Take 1 tablet (40 mg) by mouth daily, Disp: , Rfl:     metFORMIN (GLUCOPHAGE-XR) 500 MG 24 hr tablet, Take 2 tablets (1,000 mg) by mouth 2 (two) times daily, Disp: 360 tablet, Rfl: 0  metoprolol succinate (TOPROL-XL) 100 MG 24 hr tablet, TAKE ONE TABLET BY MOUTH DAILY WITH OR IMMEDIATELY FOLLOWING A MEAL; **MUST CALL MD FOR APPOINTMENT FOR FURTHER REFILLS, Disp: , Rfl:     Multiple Vitamin (Multi-Vitamin) tablet, Take 1 tablet by mouth daily, Disp: , Rfl:     nitroglycerin (NITROSTAT) 0.4 MG SL tablet, DISSOLVE 1 TAB UNDER TONGUE FOR CHEST PAIN - IF PAIN REMAINS AFTER 5 MIN, CALL 911 AND REPEAT DOSE. MAX 3 TABS IN 15 MINUTES, Disp: , Rfl:     Omega-3 Fatty Acids (fish oil) 1000 MG Cap capsule, Take 1 capsule (1,000 mg) by mouth 2 (two) times daily, Disp: , Rfl:     warfarin (COUMADIN) 5 MG tablet, TAKE ONE TO ONE AND ONE-HALF TABLETS BY MOUTH DAILY AS DIRECTED BY COUMADIN CLINIC, Disp: , Rfl:     ALLERGIES:   Allergies   Allergen Reactions    Cephalexin Rash        FAMILY HISTORY: His family history includes Cancer in his brother.  Mother had heart disease  SOCIAL HISTORY: He reports that he has never smoked. He does not have any smokeless tobacco history on file. He reports that he does not currently use alcohol. He reports that he does not currently use drugs.    REVIEW OF SYSTEMS: All other systems reviewed and  were negative except as stated above.     PHYSICAL EXAMINATION  General Appearance:  A well-appearing male in no acute distress.    Vital Signs: BP 138/71   Pulse 71   Ht 1.753 m (5\' 9" )   Wt 94.3 kg (208 lb)   SpO2 94%   BMI 30.72 kg/m    HEENT: Sclera anicteric, conjunctiva without pallor, moist mucous membranes, normal dentition. No arcus.   Neck:  Supple without jugular venous distention. Thyroid nonpalpable. Normal carotid upstroke without bruits.   Chest: Clear to auscultation bilaterally with good air movement and respiratory effort and no wheezes, rales, or rhonchi   Cardiovascular: Normal S1 and physiologically split S2 without murmurs, gallops or rub. PMI of normal size and nondisplaced.   Abdomen: Soft, nontender, nondistended, with normoactive bowel sounds. No organomegaly.  No pulsatile masses, or bruits.   Extremities: 1+ edema L>R. clubbing, or cyanosis. All peripheral pulses are full and equal.   Skin: No rash, xanthoma or xanthelasma.   Neuro: Alert and oriented x3. Grossly intact. Strength is symmetrical. Normal mood and affect.     ECG:      Narrative:      NORMAL SINUS RHYTHM  LEFT ATRIAL ABNORMALITY  NO PREVIOUS ECGS AVAILABLE  Confirmed by Nile Riggs MD, Dawon Troop ([id number]) on 09/01/2022 9:39:35 AM           LABS:   Lab Results   Component Value Date    CHOL 155 07/20/2022    LDL 88 07/20/2022    HDL 50 07/20/2022    TRIG 86 07/20/2022    NA 140 07/20/2022    K 4.7 07/20/2022    CL 104 07/20/2022    CO2 30 (H) 07/20/2022    BUN 10.0 07/20/2022    CREAT 0.8 07/20/2022           IMPRESSION/RECOMMENDATIONS: Carl [name] is a 81 y.o. male     Encounter Diagnoses   Name Primary?    H/O coronary artery bypass surgery     SOB (shortness of breath)     Arteriosclerosis of coronary artery     PAF (paroxysmal atrial fibrillation)  Hyperlipidemia associated with type 2 diabetes mellitus     Screening for cardiovascular condition Yes    Obesity (BMI 30-39.9)     OSA (obstructive sleep apnea)      1.  Coronary disease status post CABG in 2005. Vein graft to the OM is known to be occluded. He is asymptomatic on medical therapy. Last Myoview in 2018 without ischemia and normal EF.     2. Atrial fibrillation/flutter- paroxysmal. He is on chronic Coumadin therapy. Rate is well controlled when in Afib. He is asymptomatic.     3. Hyperlipidemia. On statin therapy and fish oil. Excellent control of LDL    4. HTN. BP is well controlled     5. 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 Last A1c 6.8%.     6. Edema. Chronic.     8. OSA- followed by pulmonary - has not required CPAP yet.    9. 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 Will monitor.      Chayanne Speir presents for cardiology consultation for known cardiac disease including CAD/CABG, PAF, obesity, OSA.  He has occasional sob that gets better with an inhaler. His angina pre cabg was chest burning which does not occur, though he gets epigastric burning better with tums and is likely gerd.  He is in NSR today. He is on coumadin for PAF. No INR in last 3 months,    Plan:  Nuclear stress test  Echo  Heart monitor  Let me know if you want to change to eliquis  For now Dr. Flonnie Overman will manage coumadin  Goal blood pressure less than 130/80, see instructions below  Goal HgB A1C <7  Goal to lose 10 lbs in next 6 months, see below  Dr. Carney Living, pulmonary medicine  Follow up 3 months with nurse practitioner  Blood work today

## 2022-09-04 ENCOUNTER — Encounter (INDEPENDENT_AMBULATORY_CARE_PROVIDER_SITE_OTHER): Payer: Self-pay | Admitting: Cardiovascular Disease

## 2022-09-06 ENCOUNTER — Telehealth (INDEPENDENT_AMBULATORY_CARE_PROVIDER_SITE_OTHER): Payer: Self-pay

## 2022-09-06 ENCOUNTER — Encounter (INDEPENDENT_AMBULATORY_CARE_PROVIDER_SITE_OTHER): Payer: Self-pay

## 2022-09-06 DIAGNOSIS — I4892 Unspecified atrial flutter: Secondary | ICD-10-CM

## 2022-09-06 DIAGNOSIS — I480 Paroxysmal atrial fibrillation: Secondary | ICD-10-CM

## 2022-09-06 NOTE — Telephone Encounter (Addendum)
-----   Message from Brunetta Jeans, MD sent at 09/02/2022  4:50 PM EST -----  INR 3.4 take 2.5 mg coumadin on Sunday instead of 5 mg and continue usually and repeat INR next Tuesday.    Lmtcb

## 2022-09-06 NOTE — Telephone Encounter (Signed)
Patient will take 2.5 tonight  and recheck tomorrow      Pt aware

## 2022-09-07 ENCOUNTER — Other Ambulatory Visit: Payer: Medicare Other

## 2022-09-07 DIAGNOSIS — I480 Paroxysmal atrial fibrillation: Secondary | ICD-10-CM

## 2022-09-07 DIAGNOSIS — I4892 Unspecified atrial flutter: Secondary | ICD-10-CM

## 2022-09-08 ENCOUNTER — Other Ambulatory Visit (FREE_STANDING_LABORATORY_FACILITY): Payer: Medicare Other

## 2022-09-08 ENCOUNTER — Ambulatory Visit (INDEPENDENT_AMBULATORY_CARE_PROVIDER_SITE_OTHER): Payer: Self-pay

## 2022-09-08 DIAGNOSIS — I4892 Unspecified atrial flutter: Secondary | ICD-10-CM

## 2022-09-08 DIAGNOSIS — I480 Paroxysmal atrial fibrillation: Secondary | ICD-10-CM

## 2022-09-08 LAB — PT/INR
PT INR: 1.9 — ABNORMAL HIGH (ref 0.9–1.1)
PT: 22.3 s — ABNORMAL HIGH (ref 10.1–12.9)

## 2022-09-08 NOTE — Progress Notes (Signed)
Mailbox is full will try to call tomorrow

## 2022-09-08 NOTE — Progress Notes (Signed)
September 08, 2022     I had the pleasure of speaking with Carl [name], via telephone to introduce the [facility] service.    Patient is aware of referral to the clinic for warfarin management and all questions have been answered at this time.     Patient checks INR via lab draw     Initial anticoagulation encounter scheduled for 09/22/2021 via clinic visit at which time [facility] clinic will take over management of warfarin therapy.         Amedeo Gory, PharmD, [facility] Specialist

## 2022-09-13 NOTE — Progress Notes (Signed)
Will continue current dose of 5 mg x 3 days , 7.5 mg x 4 days    Will check with anticoagulation clinic

## 2022-09-21 NOTE — Progress Notes (Signed)
September 21, 2022    INR:    Lab Results   Component Value Date    INR 1.8 (A) 09/22/2022     via clinic POCT    INR Goal:  2 to 3    Warfarin Indication: Paroxysmal AFib/AFlutter    Referring Provider: Dr. Benjaman Lobe    Referral Date:  09/06/22  Last visit with referring provider:  09/01/22    CHA2DS2-VASc Score:  5 HTN (1), Age >/=75 (2), DM (1), and Vascular Dx (1)  HASBLED Score:  1 Elderly (Age>65)    PMH:    Past Medical History:   Diagnosis Date    Hypertension      Current Warfarin Dose: 5 mg Tues/Thurs/Sun and 7.5 mg all other days.  Patient recently moved here from [place], [place]. Warfarin previously managed by [facility] clinic there.    Warfarin Prescription(s):  5 mg    Dietary intake of vitamin K:  Low vitamin K diet (vitamin K 20-60 mcg/day on average:  Consists of 1-2 servings of medium vitamin K vegetables OR low vitamin K vegetables such as arugula, avocado, carrots, cauliflower, celery, iceberg lettuce most days of the week). May have 1 serving of high vitamin K vegetables (collard greens) once monthly. Inconsistent. Reports serving of collard greens last Sunday. Patient is in assisted living facility and eats whatever is served at the facility.    Alcohol consumption: No alcohol consumption since 1989.    Tobacco use: Former, quit in [place]:  Home meds reviewed for DDI and reconciled in [place].      Concurrent significant interacting medications/supplements:    Medications that may enhance anticoagulant effect or bleeding risks:  APAP (patient takes prn <2000 mg/day)  Allopurinol  Glucosamine  Omega-3 fatty acids  Medications that may diminish anticoagulant effect:  Vitamin C  Metformin  MVI ([employer]irkland brand, vitamin K 25 mcg).    Bleed History or current s/sx of bleeding/clotting: Nose bleed requiring cauterization ~2 years ago.    Fall History: One fall 3 years ago slipped on driveway in the rain, denies hitting head. Patient uses cane to assist with mobility.    Upcoming  procedures/surgeries:  Denies    DOAC: unable to discuss at this visit due to time. Will discuss at next visit.    Ht/Wt:   Wt Readings from Last 1 Encounters:   09/01/22 94.3 kg (208 lb)       Ht Readings from Last 1 Encounters:   09/01/22 1.753 m (5\' 9" )            BMI Readings from Last 1 Encounters:   09/01/22 30.72 kg/m     Baseline Labs:  Lab Results   Component Value Date    BUN 15.0 09/01/2022    CREAT 1.0 09/01/2022     Lab Results   Component Value Date    ALB 3.7 09/01/2022    ALB 3.7 07/20/2022     Lab Results   Component Value Date    AST 28 09/01/2022    AST 30 07/20/2022    ALT 32 09/01/2022    ALT 29 07/20/2022    ALKPHOS 98 09/01/2022    ALKPHOS 86 07/20/2022     Lab Results   Component Value Date    HGB 14.5 09/01/2022    HGB 14.3 07/20/2022    HCT 42.7 09/01/2022    HCT 44.0 07/20/2022    PLT 268 09/01/2022    PLT 248 07/20/2022  A/P:    INR 1.8 slightly below therapeutic range likely due to fluctuation in vitamin K intake.  Counseled patient on importance of consistency with vitamin K intake. Patient enjoys collard greens and unable to give them up entirely. Patient reports he eats what is served at facility and has little control over the menu. Will work with patient to try to establish weekly goal number of servings of greens. Requested that he complete food diary for this upcoming week to bring to next appt.  Warfarin 7.5 mg X 1 tonight (09/22/22), then continue 5 mg Tues/Thurs/Sun and 7.5 mg all other days.  Recheck INR in 1 week on 09/28/22 via lab draw.  Patient was counseled on the following:  Indication, mechanism of action, and dosing  INR monitoring and follow-up   Importance of adherence and instructions for missed doses  Keep an updated list of medications and inform all providers of all medication changes, including herbal products and supplements  Drug interactions (including alcohol, aspirin/NSAIDs, and acetaminophen)  Excessive alcohol can increase INR and increase risk of  bleeding.  Counseled patient to limit alcohol to no more than 1-2 standard drinks per day   Instructions for prescriptions/refills  Increased risk of bleeding/bruising while on warfarin  Take precaution to prevent falls; seek immediate medical attention if all involves trauma to the head  Signs/symptoms of bleeding and blood clots  The effect of Vitamin K on INR and the importance of maintaining a consistent amount of Vitamin K in diet.  Vitamin K Food list provided to patient.  Inform all healthcare providers before invasive procedures or surgery  Patient signed the [facility] CPA Consent and Patient Responsibilities forms today.  Warfarin managed per Clinic Protocol    Patient demonstrated understanding via teach-back.   Advised to contact our clinic at [phone] should any questions/concerns arise.      Patient verbalized understanding of information provided.    Thank you,    Paulo Fruit, PharmD, [place] Specialist   [facility]   Ph: [phone]

## 2022-09-22 ENCOUNTER — Ambulatory Visit (INDEPENDENT_AMBULATORY_CARE_PROVIDER_SITE_OTHER): Payer: Medicare Other

## 2022-09-22 ENCOUNTER — Ambulatory Visit
Admission: RE | Admit: 2022-09-22 | Discharge: 2022-09-22 | Disposition: A | Discharge status: Home / Self Care | Payer: Medicare Other | Source: Ambulatory Visit | Attending: Cardiovascular Disease | Admitting: Cardiovascular Disease

## 2022-09-22 ENCOUNTER — Other Ambulatory Visit (INDEPENDENT_AMBULATORY_CARE_PROVIDER_SITE_OTHER): Payer: Self-pay

## 2022-09-22 DIAGNOSIS — I4892 Unspecified atrial flutter: Secondary | ICD-10-CM

## 2022-09-22 DIAGNOSIS — Z951 Presence of aortocoronary bypass graft: Secondary | ICD-10-CM | POA: Insufficient documentation

## 2022-09-22 DIAGNOSIS — E669 Obesity, unspecified: Secondary | ICD-10-CM

## 2022-09-22 DIAGNOSIS — E785 Hyperlipidemia, unspecified: Secondary | ICD-10-CM | POA: Insufficient documentation

## 2022-09-22 DIAGNOSIS — I480 Paroxysmal atrial fibrillation: Secondary | ICD-10-CM | POA: Insufficient documentation

## 2022-09-22 DIAGNOSIS — G4733 Obstructive sleep apnea (adult) (pediatric): Secondary | ICD-10-CM | POA: Insufficient documentation

## 2022-09-22 DIAGNOSIS — Z136 Encounter for screening for cardiovascular disorders: Secondary | ICD-10-CM | POA: Insufficient documentation

## 2022-09-22 DIAGNOSIS — E1169 Type 2 diabetes mellitus with other specified complication: Secondary | ICD-10-CM | POA: Insufficient documentation

## 2022-09-22 DIAGNOSIS — R0602 Shortness of breath: Secondary | ICD-10-CM

## 2022-09-22 DIAGNOSIS — I2510 Atherosclerotic heart disease of native coronary artery without angina pectoris: Secondary | ICD-10-CM | POA: Insufficient documentation

## 2022-09-22 LAB — POCT PROTHROMBIN TIME (PT): INR POCT: 1.8 — AB (ref 2–3)

## 2022-09-22 MED ORDER — FUROSEMIDE 20 MG PO TABS
20.0000 mg | ORAL_TABLET | Freq: Every day | ORAL | 0 refills | Status: DC
Start: 2022-09-22 — End: 2022-12-16

## 2022-09-22 MED ORDER — SULFUR HEXAFLUORIDE MICROSPH 60.7-25 MG IJ SUSR
5.0000 mL | Freq: Once | INTRAMUSCULAR | Status: AC
Start: 2022-09-22 — End: 2022-09-22
  Administered 2022-09-22: 5 mL via INTRAVENOUS
  Filled 2022-09-22: qty 5

## 2022-09-22 MED ORDER — SULFUR HEXAFLUORIDE MICROSPH 60.7-25 MG IJ SUSR
INTRAMUSCULAR | Status: AC
Start: 2022-09-22 — End: ?
  Filled 2022-09-22: qty 5

## 2022-09-22 NOTE — Telephone Encounter (Signed)
Prescription Refill Checklist    Completed Process Reviewed Notes   [x]  Verified med on pt chart.    [x]  2.  Reviewed patient allergies    [x]  3.  Review last encounters since OV    [x]  4.  Determine if pt has f/u as instructed or needs OV    []  5.  If OV required, pend 30 day supply of medication    []  6.  Review labs are up to date.    []  7. Filled by RN 90 day x 0 May only be done 1 x yearly   DATE:         Reviewed -  JR    Last appt-09/01/2022  Next appt-12/01/2022

## 2022-09-26 ENCOUNTER — Encounter (INDEPENDENT_AMBULATORY_CARE_PROVIDER_SITE_OTHER): Payer: Self-pay

## 2022-09-27 LAB — ECHO ADULT TTE COMPLETE
AV Area (Cont Eq VTI): 1.827
AV Area (Cont Eq VTI): 1.86
AV Mean Gradient: 7
AV Mean Gradient: 8
AV Mean Gradient: 8
AV Mean Gradient: 9
AV Mean Gradient: 9
AV Peak Velocity: 1.96
AV Peak Velocity: 2.01
AV Peak Velocity: 2.01
AV Peak Velocity: 2.07
AV Peak Velocity: 2.08
Ao Root Diameter (2D): 2.9
BP Mod LV Ejection Fraction: 67.5
IVS Diastolic Thickness (2D): 1.01
LA Dimension (2D): 4.5
LA Dimension (2D): 4.8
LA Volume Index (BP A-L): 38
LVID diastole (2D): 4.92
LVID systole (2D): 3.02
MV Area (PHT): 3.313
MV E/A: 1.363
MV E/A: 1.4
MV E/e' (Average): 8.3
Mitral Valve Findings: NORMAL
Prox Ascending Aorta Diameter: 3.1
Prox Ascending Aorta Diameter: 3.3
Prox Ascending Aorta Diameter: 3.3
Pulmonary Valve Findings: NORMAL
RV Basal Diastolic Dimension: 3.19
RV Function: NORMAL
RV Systolic Pressure: 19
RV Systolic Pressure: 21.318
Site RA Size (AS): NORMAL
Site RV Size (AS): NORMAL
Summary: NORMAL
Summary: NORMAL
Summary: NORMAL
Summary: NORMAL
Summary: NORMAL
Summary: NORMAL
TAPSE: 2.03
Tricuspid Valve Findings: NORMAL

## 2022-09-28 ENCOUNTER — Other Ambulatory Visit (FREE_STANDING_LABORATORY_FACILITY): Payer: Medicare Other

## 2022-09-28 DIAGNOSIS — I4892 Unspecified atrial flutter: Secondary | ICD-10-CM

## 2022-09-28 LAB — PT/INR
PT INR: 3.2 — ABNORMAL HIGH (ref 0.9–1.1)
PT: 37.6 s — ABNORMAL HIGH (ref 10.1–12.9)

## 2022-09-28 NOTE — Addendum Note (Signed)
Addended by: Noberto Retort LEE on: 09/28/2022 11:49 AM     Modules accepted: Orders

## 2022-09-29 ENCOUNTER — Ambulatory Visit (INDEPENDENT_AMBULATORY_CARE_PROVIDER_SITE_OTHER): Payer: Self-pay

## 2022-09-29 NOTE — Progress Notes (Signed)
September 29, 2022    INR:    Lab Results   Component Value Date    INR 3.2 (H) 09/28/2022     via lab draw    INR Goal:  2-3    Warfarin Indication: Paroxysmal AFib/AFlutter     CHA2DS2-VASc Score:  5 HTN (1), Age >/=75 (2), DM (1), and Vascular Dx (1)  HASBLED Score:  1 Elderly (Age>65)    Referring Provider: Dr. Benjaman Lobe     Referral Date:  09/06/22     Last visit with referring provider:  09/01/22     PMH:    Past Medical History:   Diagnosis Date    Hypertension        Current warfarin dose:    Instructed Dose Reported Dose   Warfarin 7.5 mg X 1 tonight (09/22/22), then continue 5 mg Tues/Thurs/Sun and 7.5 mg all other days. No deviation from instructed dose.  Denies missed or extras dose(s).   7-day warfarin total:  47.5mg  7-day warfarin total:  47.5mg      Warfarin Tab Strengths on hand:  5mg     Lab Results   Component Value Date    INR 3.2 (H) 09/28/2022    INR 1.8 (A) 09/22/2022    INR 1.9 (H) 09/08/2022    INR 3.4 (H) 09/01/2022       Dietary intake of vitamin K:  Low Vitamin K diet. Inconsistent. May have 1 serving of high vitamin K vegetables (collard greens) once monthly.  Patient is in assisted living facility and eats whatever is served at the facility.  Did not keep food diary as instructed.  Patient enjoys collard greens and unable to give them up entirely. Patient reports he eats what is served at facility and has little control over the menu.     Alcohol consumption:  No alcohol consumption since 1989.     Tobacco use: Former, quit in 1989     Medication changes:  Denies    New Medication/Dietary/Herbal Interactions: na    Concurrent significant interacting medications/supplements:    Medications that may enhance anticoagulant effect or bleeding risks:  APAP (patient takes prn <2000 mg/day)  Allopurinol  Glucosamine  Omega-3 fatty acids  Medications that may diminish anticoagulant effect:  Vitamin C  Metformin  MVI ([employer] brand, vitamin K 25 mcg).    S/sx of bleeding/clotting:  Denies    Recent  Falls:  Denies  Patient uses cane to assist with mobility.     Upcoming procedures/surgeries:  Denies    Is patient a candidate for DOAC?  Yes    If yes, date and outcome of discussion: Will discuss at next encounter.     Others:  na    ===========================    A/P:    INR 3.2 above therapeutic range.  Supratherapeutic INR with unidentified cause, potentially due to dietary vit K fluctuation.   Continue Warfarin 5 mg Tues/Thurs/Sun and 7.5 mg all other days (45mg /wk)  Recheck INR in 1 week(s) on 10/06/22 via lab draw  Counseled patient on importance of consistency with vitamin K intake. Instructed pt to keep a food diary of his vegetable consumption each day, pt agreed.   TTR 44.2%, will discuss the option of DOAC at next encounter.   Warfarin has been managed per Clinic Protocol      Patient verbalized understanding of instructions provided.  Encounter conducted via the telephone.     Ola Spurr, PharmD, BCPS  Clinical Pharmacy Specialist-Anticoagulation

## 2022-09-30 ENCOUNTER — Ambulatory Visit
Admission: RE | Admit: 2022-09-30 | Discharge: 2022-09-30 | Disposition: A | Discharge status: Home / Self Care | Payer: Medicare Other | Source: Ambulatory Visit | Attending: Cardiovascular Disease | Admitting: Cardiovascular Disease

## 2022-09-30 DIAGNOSIS — R0602 Shortness of breath: Secondary | ICD-10-CM | POA: Insufficient documentation

## 2022-09-30 DIAGNOSIS — I2510 Atherosclerotic heart disease of native coronary artery without angina pectoris: Secondary | ICD-10-CM | POA: Insufficient documentation

## 2022-09-30 DIAGNOSIS — I480 Paroxysmal atrial fibrillation: Secondary | ICD-10-CM | POA: Insufficient documentation

## 2022-09-30 DIAGNOSIS — E1169 Type 2 diabetes mellitus with other specified complication: Secondary | ICD-10-CM | POA: Insufficient documentation

## 2022-09-30 DIAGNOSIS — Z136 Encounter for screening for cardiovascular disorders: Secondary | ICD-10-CM | POA: Insufficient documentation

## 2022-09-30 DIAGNOSIS — Z951 Presence of aortocoronary bypass graft: Secondary | ICD-10-CM | POA: Insufficient documentation

## 2022-09-30 DIAGNOSIS — E785 Hyperlipidemia, unspecified: Secondary | ICD-10-CM | POA: Insufficient documentation

## 2022-09-30 DIAGNOSIS — E669 Obesity, unspecified: Secondary | ICD-10-CM | POA: Insufficient documentation

## 2022-09-30 DIAGNOSIS — G4733 Obstructive sleep apnea (adult) (pediatric): Secondary | ICD-10-CM | POA: Insufficient documentation

## 2022-09-30 MED ORDER — TECHNETIUM TC 99M TETROFOSMIN IV KIT
10.0000 | PACK | Freq: Once | INTRAVENOUS | Status: AC | PRN
Start: 2022-09-30 — End: 2022-09-30
  Administered 2022-09-30: 10 via INTRAVENOUS
  Filled 2022-09-30: qty 100

## 2022-09-30 MED ORDER — REGADENOSON 0.4 MG/5ML IV SOLN
0.4000 mg | Freq: Once | INTRAVENOUS | Status: AC | PRN
Start: 2022-09-30 — End: 2022-09-30
  Administered 2022-09-30: 0.4 mg via INTRAVENOUS

## 2022-09-30 MED ORDER — TECHNETIUM TC 99M TETROFOSMIN IV KIT
35.0000 | PACK | Freq: Once | INTRAVENOUS | Status: AC | PRN
Start: 2022-09-30 — End: 2022-09-30
  Administered 2022-09-30: 35 via INTRAVENOUS
  Filled 2022-09-30: qty 100

## 2022-09-30 MED ORDER — REGADENOSON 0.4 MG/5ML IV SOLN
INTRAVENOUS | Status: AC
Start: 2022-09-30 — End: ?
  Filled 2022-09-30: qty 5

## 2022-09-30 NOTE — Progress Notes (Signed)
Left message with pt's son gene to have patient call [facility] to discuss INR result.  Per pt, did not receive call from son.     Ola Spurr, PharmD, BCPS  Clinical Pharmacy Specialist-Anticoagulation

## 2022-10-02 ENCOUNTER — Encounter (INDEPENDENT_AMBULATORY_CARE_PROVIDER_SITE_OTHER): Payer: Self-pay | Admitting: Cardiovascular Disease

## 2022-10-03 ENCOUNTER — Ambulatory Visit (INDEPENDENT_AMBULATORY_CARE_PROVIDER_SITE_OTHER): Payer: Medicare Other | Admitting: Family Nurse Practitioner

## 2022-10-03 ENCOUNTER — Encounter (INDEPENDENT_AMBULATORY_CARE_PROVIDER_SITE_OTHER): Payer: Self-pay

## 2022-10-03 VITALS — BP 133/82 | HR 80 | Temp 99.8°F | Resp 18 | Ht 69.0 in | Wt 209.0 lb

## 2022-10-03 DIAGNOSIS — R112 Nausea with vomiting, unspecified: Secondary | ICD-10-CM

## 2022-10-03 DIAGNOSIS — Z20822 Contact with and (suspected) exposure to covid-19: Secondary | ICD-10-CM

## 2022-10-03 DIAGNOSIS — R197 Diarrhea, unspecified: Secondary | ICD-10-CM

## 2022-10-03 DIAGNOSIS — K529 Noninfective gastroenteritis and colitis, unspecified: Secondary | ICD-10-CM

## 2022-10-03 LAB — NM MYOCARDIAL PERFUSION SPECT W STRESS AND REST: Stress LV Ejection Fraction: 69

## 2022-10-03 LAB — POCT INFLUENZA A/B
POCT Rapid Influenza A AG: NEGATIVE
POCT Rapid Influenza B AG: NEGATIVE

## 2022-10-03 LAB — POC COVID QUICKVUE ANTIGEN: QuickVue SARS COV2 Antigen POCT: NEGATIVE

## 2022-10-03 LAB — POCT GLUCOSE: Whole Blood Glucose POCT: 200 mg/dL — AB (ref 70–100)

## 2022-10-03 MED ORDER — FAMOTIDINE 20 MG PO TABS
20.0000 mg | ORAL_TABLET | Freq: Two times a day (BID) | ORAL | 0 refills | Status: AC
Start: 2022-10-03 — End: 2022-10-13

## 2022-10-03 MED ORDER — ONDANSETRON 4 MG PO TBDP
4.0000 mg | ORAL_TABLET | Freq: Once | ORAL | Status: DC
Start: 2022-10-03 — End: 2022-10-03

## 2022-10-03 MED ORDER — ONDANSETRON HCL 4 MG/5ML PO SOLN
4.0000 mg | Freq: Once | ORAL | Status: AC
Start: 2022-10-03 — End: 2022-10-03
  Administered 2022-10-03: 4 mg via ORAL

## 2022-10-03 MED ORDER — ONDANSETRON 4 MG PO TBDP
4.0000 mg | ORAL_TABLET | Freq: Three times a day (TID) | ORAL | 0 refills | Status: AC | PRN
Start: 2022-10-03 — End: 2022-10-10

## 2022-10-03 MED ORDER — FAMOTIDINE 10 MG PO TABS
20.0000 mg | ORAL_TABLET | Freq: Once | ORAL | Status: AC
Start: 2022-10-03 — End: 2022-10-03
  Administered 2022-10-03: 20 mg via ORAL

## 2022-10-03 NOTE — Patient Instructions (Signed)
Covid,flu negative  Zofran for n/v  Encourage fluid  BRAT diet  Motrin or tylenol as needed for pain or fever  Follow up with PCP if not better

## 2022-10-03 NOTE — Progress Notes (Signed)
[place]  CARE  PROGRESS NOTE     Patient: Carl [name]   Date: 10/03/2022   MRN: [id number]         HISTORY     History obtained from: Patient    Chief Complaint   Patient presents with    Emesis     Pt c/o diarrhea, vomiting, dizzy, jittery since last night   Had ham burger last night from cafeteria   Blood glucose fasting earlier today - 209           HPI ;Carl [name] is a 82 y.o. male presenting to UC C/O nausea and vomtiting and diarrhea,mild dizziness from vomiting , feeling of shakiness since last night. Symtpoms started right after eating dinner in [place], had a burger for dinner. Denies any fever or chills, abdominal pain, dysuria,bloody stool or urine. BS was 209. But did not have any breakfast this morning.      Review of Systems   Constitutional: Negative.  Negative for fever.   HENT: Negative.     Respiratory:  Negative for shortness of breath.    Cardiovascular:  Negative for chest pain and leg swelling.   Gastrointestinal:  Positive for diarrhea, nausea and vomiting. Negative for abdominal distention, abdominal pain, anal bleeding, blood in stool, constipation and rectal pain.   Skin:  Negative for rash.   Neurological: Negative.    Psychiatric/Behavioral:  Negative for behavioral problems.        History:    Pertinent Past Medical, Surgical, Family and Social History were reviewed.        Current Outpatient Medications:     albuterol sulfate HFA (PROVENTIL) 108 (90 Base) MCG/ACT inhaler, INHALE TWO PUFFS BY MOUTH EVERY 6 HOURS AS NEEDED FOR WHEEZING OR SHORTNESS OF BREATH, Disp: , Rfl:     allopurinol (ZYLOPRIM) 300 MG tablet, Take 1 tablet (300 mg) by mouth daily, Disp: , Rfl:     amLODIPine (NORVASC) 10 MG tablet, Take 1 tablet (10 mg) by mouth daily, Disp: , Rfl:     Ascorbic Acid (vitamin C) 1000 MG tablet, Take 1 tablet (1,000 mg) by mouth daily, Disp: , Rfl:     atorvastatin (LIPITOR) 40 MG tablet, Take 1 tablet (40 mg) by mouth daily, Disp: , Rfl:     B Complex Vitamins (vitamin B  complex) Tab, Take 1 tablet by mouth daily, Disp: , Rfl:     cloNIDine (CATAPRES) 0.2 MG tablet, Take 1 tablet (0.2 mg) by mouth 2 (two) times daily, Disp: , Rfl:     furosemide (LASIX) 20 MG tablet, Take 1 tablet (20 mg) by mouth daily, Disp: 90 tablet, Rfl: 0    glipiZIDE (GLUCOTROL) 10 MG 24 hr tablet, Take 1 tablet (10 mg) by mouth daily, Disp: 90 tablet, Rfl: 3    Glucosamine-Chondroit-Vit C-Mn (GLUCOSAMINE 1500 COMPLEX PO), Take 2 tablets by mouth daily, Disp: , Rfl:     hydrALAZINE (APRESOLINE) 25 MG tablet, Take 1 tablet (25 mg) by mouth 3 (three) times daily Takes as needed, Disp: , Rfl:     isosorbide mononitrate (IMDUR) 30 MG 24 hr tablet, Take 1 tablet (30 mg) by mouth daily, Disp: , Rfl:     lisinopril (ZESTRIL) 40 MG tablet, Take 1 tablet (40 mg) by mouth daily, Disp: , Rfl:     metFORMIN (GLUCOPHAGE-XR) 500 MG 24 hr tablet, Take 2 tablets (1,000 mg) by mouth 2 (two) times daily, Disp: 360 tablet, Rfl: 0    metoprolol succinate (TOPROL-XL) 100 MG  24 hr tablet, TAKE ONE TABLET BY MOUTH DAILY WITH OR IMMEDIATELY FOLLOWING A MEAL; **MUST CALL MD FOR APPOINTMENT FOR FURTHER REFILLS, Disp: , Rfl:     Multiple Vitamin (Multi-Vitamin) tablet, Take 1 tablet by mouth daily, Disp: , Rfl:     nitroglycerin (NITROSTAT) 0.4 MG SL tablet, Place 1 tablet (0.4 mg) under the tongue every 5 (five) minutes as needed for Chest pain, Disp: 30 tablet, Rfl: 5    Omega-3 Fatty Acids (fish oil) 1000 MG Cap capsule, Take 1 capsule (1,000 mg) by mouth 2 (two) times daily, Disp: , Rfl:     Vitamin D, Cholecalciferol, 50 MCG (2000 UT) Cap, Take 2,000 Units by mouth daily, Disp: , Rfl:     warfarin (COUMADIN) 5 MG tablet, Take 1 tablet (5 mg) by mouth daily Take 5 mg Sunday, Tuesday, Thursday Take 7.5 mg Monday, Wednesday, Friday, saturday, Disp: 90 tablet, Rfl: 0    acetaminophen (TYLENOL) 500 MG tablet, Take 1 tablet (500 mg) by mouth every 6 (six) hours as needed (Patient not taking: Reported on 10/03/2022), Disp: , Rfl:      famotidine (PEPCID) 20 MG tablet, Take 1 tablet (20 mg) by mouth 2 (two) times daily for 10 days, Disp: 20 tablet, Rfl: 0    glucose blood (OneTouch Ultra) test strip, 1 each by Other route daily Use as instructed, Disp: 100 strip, Rfl: 5    Lancets (OneTouch Delica Plus [id number]) Misc, Inject 1 Unit into the skin daily USE DAILY AS DIRECTED, Disp: 100 each, Rfl: 5    ondansetron (ZOFRAN-ODT) 4 MG disintegrating tablet, Take 1 tablet (4 mg) by mouth every 8 (eight) hours as needed for Nausea, Disp: 10 tablet, Rfl: 0  No current facility-administered medications for this visit.    Allergies   Allergen Reactions    Cephalexin Rash       Medications and Allergies reviewed.    PHYSICAL EXAM     Vitals:    10/03/22 0953   BP: 133/82   Pulse: 80   Resp: 18   Temp: 99.8 F (37.7 C)   TempSrc: Tympanic   SpO2: 97%   Weight: 94.8 kg (209 lb)   Height: 1.753 m (5\' 9" )       Physical Exam  Constitutional:       General: He is not in acute distress.     Appearance: He is well-developed. He is not ill-appearing, toxic-appearing or diaphoretic.   HENT:      Head: Normocephalic and atraumatic.      Mouth/Throat:      Pharynx: Posterior oropharyngeal erythema present.   Cardiovascular:      Rate and Rhythm: Normal rate and regular rhythm.      Heart sounds: Normal heart sounds. No murmur heard.  Pulmonary:      Effort: Pulmonary effort is normal. No respiratory distress.      Breath sounds: Normal breath sounds. No stridor. No wheezing, rhonchi or rales.   Abdominal:      General: Bowel sounds are normal. There is no distension.      Palpations: Abdomen is soft. There is no mass.      Tenderness: There is no abdominal tenderness. There is no right CVA tenderness, left CVA tenderness, guarding or rebound. Negative signs include Murphy's sign, Rovsing's sign, McBurney's sign, psoas sign and obturator sign.      Hernia: No hernia is present. There is no hernia in the umbilical area, ventral area, left inguinal area, right femoral  area, left femoral area or right inguinal area.   Musculoskeletal:      Cervical back: Normal range of motion and neck supple.   Neurological:      Mental Status: He is alert and oriented to person, place, and time.   Skin:     General: Skin is warm and dry.      Findings: No rash.   Chest:      Chest wall: No tenderness.   Vitals and nursing note reviewed.       UCC COURSE     LABS  The following POCT tests were ordered, reviewed and discussed with the patient/family.     Results       Procedure Component Value Units Date/Time    QuickVue SARS-COV-2 Antigen POCT [[id number]]  (Normal) Collected: 10/03/22 1036    Specimen: Nasal Swab COVID-19 from [facility] Updated: 10/03/22 1036     QuickVue SARS COV2 Antigen POCT Negative    POCT Influenza A/B [[id number]]  (Normal) Collected: 10/03/22 1036     Updated: 10/03/22 1036     POCT QC Pass     POCT Rapid Influenza A AG Negative     POCT Rapid Influenza B AG Negative    POCT Glucose [[id number]]  (Abnormal) Collected: 10/03/22 1031     Updated: 10/03/22 1031     Whole Blood Glucose POCT 200 mg/dL             There were no x-rays reviewed with this patient during the visit.    No current facility-administered medications for this visit.       PROCEDURES     Procedures    MEDICAL DECISION MAKING     History, physical, labs/studies most consistent with gastroenteritis as the diagnosis.    Chart Review:  Prior PCP, Specialist and/or ED notes reviewed today: Not Applicable  Prior labs/images/studies reviewed today: Not Applicable    Differential Diagnosis: DDX: liver/gall bladder problems, stomach/acid reflux/gastritis, food sensitivity, pancreatitis, appendicitis, obstruction, hernia        ASSESSMENT     Encounter Diagnoses   Name Primary?    Gastroenteritis Yes    Nausea vomiting and diarrhea             PLAN      PLAN:   Covid,flu  negative  Zofran for n/v  Encourage fluid  Give BRAT diet  tylenol as needed for pain or fever  Based on the clinical findings, suspicious for  gastritis/reflux or food sensitivity    Recommend monitor/keep food and symptom log.   Avoid triggers.  Eat small amounts frequently.   Take daily pepcid for burning    Will start work up but follow up with your doctor in 3-5 days to recheck     Meanwhile, if pain worse, vomiting, fever, chest pain, dizziness, bloody vomit / stool, breathing problem - go to ER.          Orders Placed This Encounter   Procedures    QuickVue SARS-COV-2 Antigen POCT    POCT Influenza A/B    POCT Glucose     Requested Prescriptions     Signed Prescriptions Disp Refills    ondansetron (ZOFRAN-ODT) 4 MG disintegrating tablet 10 tablet 0     Sig: Take 1 tablet (4 mg) by mouth every 8 (eight) hours as needed for Nausea    famotidine (PEPCID) 20 MG tablet 20 tablet 0     Sig: Take 1 tablet (20 mg) by mouth 2 (two) times daily  for 10 days       Discussed results and diagnosis with patient/family.  Reviewed warning signs for worsening condition, as well as, indications for follow-up with primary care physician and return to urgent care clinic.   Patient/family expressed understanding of instructions.     An After Visit Summary was provided to the patient.

## 2022-10-05 ENCOUNTER — Ambulatory Visit (INDEPENDENT_AMBULATORY_CARE_PROVIDER_SITE_OTHER): Payer: Self-pay

## 2022-10-05 ENCOUNTER — Encounter (INDEPENDENT_AMBULATORY_CARE_PROVIDER_SITE_OTHER): Payer: Self-pay

## 2022-10-05 ENCOUNTER — Other Ambulatory Visit (FREE_STANDING_LABORATORY_FACILITY): Payer: Medicare Other

## 2022-10-05 DIAGNOSIS — I4892 Unspecified atrial flutter: Secondary | ICD-10-CM

## 2022-10-05 LAB — PT/INR
PT INR: 3.8 — ABNORMAL HIGH (ref 0.9–1.1)
PT: 44.6 s — ABNORMAL HIGH (ref 10.1–12.9)

## 2022-10-05 NOTE — Progress Notes (Signed)
October 05, 2022    INR:    Lab Results   Component Value Date    INR 3.8 (H) 10/05/2022     via lab draw    INR Goal:  2-3    Warfarin Indication: Paroxysmal AFib/AFlutter     CHA2DS2-VASc Score:  5 HTN (1), Age >/=75 (2), DM (1), and Vascular Dx (1)  HASBLED Score:  1 Elderly (Age>65)    Referring Provider: Dr. Benjaman Lobe     Referral Date:  09/06/22     Last visit with referring provider:  09/01/22     PMH:    Past Medical History:   Diagnosis Date    Diabetes mellitus     Hyperlipidemia     Hypertension        Current warfarin dose:    Instructed Dose Reported Dose   5 mg every Sun/Tues/Thurs; 7.5 mg all other days of the week  Unable to assess    7-day warfarin total: 45 mg 7-day warfarin total:  unable to assess.      Warfarin Tab Strengths on hand:  5mg     Lab Results   Component Value Date    INR 3.8 (H) 10/05/2022    INR 3.2 (H) 09/28/2022    INR 1.8 (A) 09/22/2022    INR 1.9 (H) 09/08/2022    INR 3.4 (H) 09/01/2022     ===========================    A/P:    INR 3.8 above therapeutic range. Of note per chart review patient seen at [facility] for gastritis on 10/03/22. Unable to reach patient via telephone after two attempts. LVM with instructions for patient to hold warfarin today (10/05/22) and contact clinic tomorrow to discuss INR results. Instructions also sent via [place].   Warfarin has been managed per Nixon, PharmD, [facility] Specialist

## 2022-10-06 ENCOUNTER — Ambulatory Visit (INDEPENDENT_AMBULATORY_CARE_PROVIDER_SITE_OTHER): Payer: Self-pay

## 2022-10-06 NOTE — Progress Notes (Signed)
October 06, 2022    INR:    Lab Results   Component Value Date    INR 3.8 (H) 10/05/2022     via lab draw    INR Goal:  2-3    Warfarin Indication: Paroxysmal AFib/AFlutter     CHA2DS2-VASc Score:  5 HTN (1), Age >/=75 (2), DM (1), and Vascular Dx (1)  HASBLED Score:  1 Elderly (Age>65)    Referring Provider: Dr. Benjaman Lobe     Referral Date:  09/06/22     Last visit with referring provider:  09/01/22     PMH:    Past Medical History:   Diagnosis Date    Diabetes mellitus     Hyperlipidemia     Hypertension      Current warfarin dose:    Instructed Dose Reported Dose   Warfarin 7.5 mg X 1 (09/22/22), then continue 5 mg Tues/Thurs/Sun and 7.5 mg all other days.  Unable to reach patient yesterday to discuss INR. LVM 10/05/22 for pt to hold warfarin dose X 1 for INR 3.8 Patient confirms holding warfarin dose yesterday (10/05/22)   7-day warfarin total:  45 mg 7-day warfarin total:  45 mg     Warfarin Tab Strengths on hand:  5mg     Lab Results   Component Value Date    INR 3.8 (H) 10/05/2022    INR 3.2 (H) 09/28/2022    INR 1.8 (A) 09/22/2022    INR 1.9 (H) 09/08/2022    INR 3.4 (H) 09/01/2022     Dietary intake of vitamin K:  Low Vitamin K diet. Inconsistent. May have 1 serving of high vitamin K vegetables (collard greens) once monthly.  Patient is in assisted living facility and eats whatever is served at the facility.  Did not keep food diary as instructed.  Patient enjoys collard greens and unable to give them up entirely. Patient reports he eats what is served at facility and has little control over the menu.   Appetite reduced the last several days due to gastritis. Patient resumed regular diet yesterday. Denies any [employer] vegetables since last week.    Alcohol consumption:  No alcohol consumption since 1989.     Tobacco use: Former, quit in 1989     Medication changes:    Famotidine 20 mg BID  Ondansetron 4 mg every 8 hours, no longer requiring.    New Medication/Dietary/Herbal Interactions: No interaction between  warfarin and famotidine or ondansetron.    Concurrent significant interacting medications/supplements:    Medications that may enhance anticoagulant effect or bleeding risks:  APAP (patient takes prn <2000 mg/day)  Allopurinol  Glucosamine  Omega-3 fatty acids  Medications that may diminish anticoagulant effect:  Vitamin C  Metformin  MVI ([employer] brand, vitamin K 25 mcg).    S/sx of bleeding/clotting:  Denies    Recent Falls:  Denies. Mentions having some dizziness on Monday when sick, none since.  Patient uses cane to assist with mobility.     Upcoming procedures/surgeries:  Denies    Is patient a candidate for DOAC?  Yes    If yes, date and outcome of discussion: 10/06/22: Patient interested in [place] but prefers to investigate affordability before considering.    Others: Gastritis with N/V started on Monday, has not had any symptoms since Monday.     ===========================    A/P:    INR 3.8 above therapeutic range potentially due to dietary vit K fluctuation with recent gastritis. Symptoms have improved and patient reports  resuming regular vegetable intake.  As patient confirms holding dose last evening, will continue warfarin 5 mg Tues/Thurs/Sun and 7.5 mg all other days (45mg /wk).  Recheck INR in 1 week(s) on 10/12/22 via lab draw  Counseled patient on importance of consistency with vitamin K intake. Requested pt to keep a food diary of his vegetable consumption each day, pt agreed.   Message sent to pharmacy liaison to determine copay for Eliquis.  Warfarin has been managed per Clinic Protocol    Patient verbalized understanding of instructions provided.  Encounter conducted via the telephone.     Thank you,    Paulo Fruit, PharmD, [place] Specialist   [facility]   Ph: [phone]

## 2022-10-06 NOTE — Progress Notes (Signed)
Per pharmacy liaison, copay for Eliquis is $771.30/90 days (periodic deductible $545+coinsurance $226.30). Once deductible met, Eliquis would be ~$75/month. Patient has Medicare and not eligible for copay card. Will discuss with patient at next visit.    Thank you,    Paulo Fruit, PharmD, [place] Specialist   [facility]   Ph: [phone]

## 2022-10-13 ENCOUNTER — Ambulatory Visit (INDEPENDENT_AMBULATORY_CARE_PROVIDER_SITE_OTHER): Payer: Self-pay

## 2022-10-13 ENCOUNTER — Other Ambulatory Visit (FREE_STANDING_LABORATORY_FACILITY): Payer: Medicare Other

## 2022-10-13 DIAGNOSIS — I4892 Unspecified atrial flutter: Secondary | ICD-10-CM

## 2022-10-13 LAB — PT/INR
PT INR: 2.6 — ABNORMAL HIGH (ref 0.9–1.1)
PT: 30.6 s — ABNORMAL HIGH (ref 10.1–12.9)

## 2022-10-13 NOTE — Progress Notes (Signed)
October 13, 2022    INR:    Lab Results   Component Value Date    INR 2.6 (H) 10/13/2022     via lab draw    INR Goal:  2-3    Warfarin Indication: Paroxysmal AFib/AFlutter     CHA2DS2-VASc Score:  5 HTN (1), Age >/=75 (2), DM (1), and Vascular Dx (1)  HASBLED Score:  1 Elderly (Age>65)    Referring Provider: Dr. Benjaman Lobe     Referral Date:  09/06/22     Last visit with referring provider:  09/01/22     PMH:    Past Medical History:   Diagnosis Date    Diabetes mellitus     Hyperlipidemia     Hypertension      Current warfarin dose:    Instructed Dose Reported Dose   HOLD warfarin X 1 (10/05/22), then continue 5 mg Tues/Thurs/Sun and 7.5 mg all other days.   No deviation from instructed dose.  Denies missed or extras dose(s).   7-day warfarin total:  45 mg 7-day warfarin total:  45 mg     Warfarin Tab Strengths on hand:  72m    Lab Results   Component Value Date    INR 2.6 (H) 10/13/2022    INR 3.8 (H) 10/05/2022    INR 3.2 (H) 09/28/2022    INR 1.8 (A) 09/22/2022    INR 1.9 (H) 09/08/2022    INR 3.4 (H) 09/01/2022     Dietary intake of vitamin K:  Low Vitamin K diet. Inconsistent. May have 1 serving of high vitamin K vegetables (collard greens) once monthly.  Patient is in assisted living facility and eats whatever is served at the facility.  Did not keep food diary.  Patient enjoys collard greens and unable to give them up entirely. Patient reports he eats what is served at facility and has little control over the menu.   Patient resumed regular diet after recent gastritis. Serving of turnip greens on Sunday afternoon and 3 servings of Alishah Schulte beans, will try to stay consistent this upcoming week.    Alcohol consumption:  No alcohol consumption since 1989.     Tobacco use: Former, quit in 1989     Medication changes:    Famotidine 20 mg BID  Ondansetron 4 mg every 8 hours, no longer requiring.    New Medication/Dietary/Herbal Interactions: No interaction between warfarin and famotidine or  ondansetron.    Concurrent significant interacting medications/supplements:    Medications that may enhance anticoagulant effect or bleeding risks:  APAP (patient takes prn <2000 mg/day)  Allopurinol  Glucosamine  Omega-3 fatty acids  Medications that may diminish anticoagulant effect:  Vitamin C  Metformin  MVI ([employer] brand, vitamin K 25 mcg).    S/sx of bleeding/clotting:  Denies    Recent Falls:  Denies.   Patient uses cane to assist with mobility.     Upcoming procedures/surgeries:  Denies    Is patient a candidate for DOAC?  Yes    If yes, date and outcome of discussion: 10/06/22: Per pharmacy liaison, copay for Eliquis is $771.30/90 days (periodic deductible $545+coinsurance $226.30). Once deductible met, Eliquis would be ~$75/month. Patient has Medicare and not eligible for copay card.  Patient prefers to continue warfarin for now.    Others: Recent gastritis with N/V, has not had any symptoms since 10/03/22. [facility] more [facility]    ===========================    A/P:    INR 2.6 within therapeutic  range.   Continue warfarin 5 mg Tues/Thurs/Sun and 7.5 mg all other days (83m/wk).  Recheck INR in 1 week(s) on 10/20/22 via lab draw  Counseled patient on importance of consistency with vitamin K intake. Requested pt to keep a food diary of his vegetable consumption each day, pt agreed.   Warfarin has been managed per Clinic Protocol    Patient verbalized understanding of instructions provided.  Encounter conducted via the telephone.     Thank you,    JPaulo Fruit PharmD, B[facility]neySpecialist   [facility]  Ph: [phone]

## 2022-10-20 ENCOUNTER — Other Ambulatory Visit (FREE_STANDING_LABORATORY_FACILITY): Payer: Medicare Other

## 2022-10-20 ENCOUNTER — Encounter (INDEPENDENT_AMBULATORY_CARE_PROVIDER_SITE_OTHER): Payer: Self-pay | Admitting: Family Medicine

## 2022-10-20 ENCOUNTER — Ambulatory Visit (INDEPENDENT_AMBULATORY_CARE_PROVIDER_SITE_OTHER): Payer: Medicare Other | Admitting: Family Medicine

## 2022-10-20 VITALS — BP 119/71 | HR 77 | Temp 97.4°F | Resp 16 | Ht 69.0 in | Wt 214.6 lb

## 2022-10-20 DIAGNOSIS — E785 Hyperlipidemia, unspecified: Secondary | ICD-10-CM

## 2022-10-20 DIAGNOSIS — I152 Hypertension secondary to endocrine disorders: Secondary | ICD-10-CM

## 2022-10-20 DIAGNOSIS — E1159 Type 2 diabetes mellitus with other circulatory complications: Secondary | ICD-10-CM

## 2022-10-20 DIAGNOSIS — I4892 Unspecified atrial flutter: Secondary | ICD-10-CM

## 2022-10-20 DIAGNOSIS — E1169 Type 2 diabetes mellitus with other specified complication: Secondary | ICD-10-CM

## 2022-10-20 LAB — PT/INR
PT INR: 2.6 — ABNORMAL HIGH (ref 0.9–1.1)
PT: 30.7 s — ABNORMAL HIGH (ref 10.1–12.9)

## 2022-10-20 NOTE — Patient Instructions (Signed)
Lab work soon fasting for 9 hours except for water and black coffee.   Try to avoid sugar drinks and sweets, and exercise daily.  Thank you, Roxan Diesel, MD

## 2022-10-20 NOTE — Progress Notes (Signed)
Have you seen any specialists since your last visit with us?  No      The patient was informed that the following HM items are still outstanding:   eye exam

## 2022-10-20 NOTE — Progress Notes (Signed)
Subjective:      Date: 10/20/2022 9:00 AM   Patient ID: Carl [name] is a 82 y.o. male.    Chief Complaint:  Chief Complaint   Patient presents with    Diabetes    Hypertension     Follow up       HPI:  Hypertension , and the patient is taking the medication daily without side effects.  Diabetes , and the patient is taking the medication daily without side effects.  Lab Results   Component Value Date    HGBA1C 6.5 (H) 07/20/2022     Lab Results   Component Value Date    GLU 104 (H) 09/01/2022    LDL 79 09/01/2022    CREAT 1.0 09/01/2022     His son is with him.     Problem List:  Patient Active Problem List   Diagnosis    Arteriosclerosis of coronary artery    Atrial flutter    Type 2 diabetes mellitus with other specified complication    Hyperlipidemia associated with type 2 diabetes mellitus    Hypertension associated with type 2 diabetes mellitus    Neuropathy due to type 2 diabetes mellitus    Obesity (BMI 30-39.9)    OSA (obstructive sleep apnea)    Subjective tinnitus of both ears    Melanoma in situ of face    Arthritis of both knees    Bilateral hip joint arthritis    Intrinsic eczema    Chronic cough       Current Medications:  Outpatient Medications Marked as Taking for the 10/20/22 encounter (Office Visit) with Micholas Drumwright Mounaf, MD   Medication Sig Dispense Refill    albuterol sulfate HFA (PROVENTIL) 108 (90 Base) MCG/ACT inhaler INHALE TWO PUFFS BY MOUTH EVERY 6 HOURS AS NEEDED FOR WHEEZING OR SHORTNESS OF BREATH      allopurinol (ZYLOPRIM) 300 MG tablet Take 1 tablet (300 mg) by mouth daily      amLODIPine (NORVASC) 10 MG tablet Take 1 tablet (10 mg) by mouth daily      Ascorbic Acid (vitamin C) 1000 MG tablet Take 1 tablet (1,000 mg) by mouth daily      atorvastatin (LIPITOR) 40 MG tablet Take 1 tablet (40 mg) by mouth daily      B Complex Vitamins (vitamin B complex) Tab Take 1 tablet by mouth daily      cloNIDine (CATAPRES) 0.2 MG tablet Take 1 tablet (0.2 mg) by mouth 2 (two) times daily       furosemide (LASIX) 20 MG tablet Take 1 tablet (20 mg) by mouth daily 90 tablet 0    glipiZIDE (GLUCOTROL) 10 MG 24 hr tablet Take 1 tablet (10 mg) by mouth daily 90 tablet 3    Glucosamine-Chondroit-Vit C-Mn (GLUCOSAMINE 1500 COMPLEX PO) Take 2 tablets by mouth daily      glucose blood (OneTouch Ultra) test strip 1 each by Other route daily Use as instructed 100 strip 5    hydrALAZINE (APRESOLINE) 25 MG tablet Take 1 tablet (25 mg) by mouth 3 (three) times daily Takes as needed      isosorbide mononitrate (IMDUR) 30 MG 24 hr tablet Take 1 tablet (30 mg) by mouth daily      Lancets (OneTouch Delica Plus 0000000) Misc Inject 1 Unit into the skin daily USE DAILY AS DIRECTED 100 each 5    lisinopril (ZESTRIL) 40 MG tablet Take 1 tablet (40 mg) by mouth daily      metFORMIN (  GLUCOPHAGE-XR) 500 MG 24 hr tablet Take 2 tablets (1,000 mg) by mouth 2 (two) times daily 360 tablet 0    metoprolol succinate (TOPROL-XL) 100 MG 24 hr tablet TAKE ONE TABLET BY MOUTH DAILY WITH OR IMMEDIATELY FOLLOWING A MEAL; **MUST CALL MD FOR APPOINTMENT FOR FURTHER REFILLS      Multiple Vitamin (Multi-Vitamin) tablet Take 1 tablet by mouth daily      nitroglycerin (NITROSTAT) 0.4 MG SL tablet Place 1 tablet (0.4 mg) under the tongue every 5 (five) minutes as needed for Chest pain 30 tablet 5    Omega-3 Fatty Acids (fish oil) 1000 MG Cap capsule Take 1 capsule (1,000 mg) by mouth 2 (two) times daily      Vitamin D, Cholecalciferol, 50 MCG (2000 UT) Cap Take 2,000 Units by mouth daily      warfarin (COUMADIN) 5 MG tablet Take 1 tablet (5 mg) by mouth daily Take 5 mg Sunday, Tuesday, Thursday  Take 7.5 mg Monday, Wednesday, Friday, saturday 90 tablet 0       Allergies:  Allergies   Allergen Reactions    Cephalexin Rash       Past Medical History:  Past Medical History:   Diagnosis Date    Diabetes mellitus     Hyperlipidemia     Hypertension        Past Surgical History:  Past Surgical History:   Procedure Laterality Date    CARDIAC SURGERY       TONSILLECTOMY         Family History:  Family History   Problem Relation Age of Onset    Cancer Brother        Social History:  Social History     Tobacco Use    Smoking status: Never   Vaping Use    Vaping Use: Never used   Substance Use Topics    Alcohol use: Not Currently    Drug use: Not Currently        The following sections were reviewed this encounter by the provider:        ROS:  Review of system was negative except for what was mentioned in HPI    Objective:     Vitals:  BP 119/71 (BP Site: Left arm, Patient Position: Sitting, Cuff Size: X-Large)   Pulse 77   Temp 97.4 F (36.3 C)   Resp 16   Ht 1.753 m (5' 9"$ )   Wt 97.3 kg (214 lb 9.6 oz)   SpO2 95%   BMI 31.69 kg/m     Physical Exam:  Physical Exam  General Examination:  GENERAL APPEARANCE: alert, in no acute distress, well developed, well nourished, oriented to time, place, and person.   HEAD: normal appearance.   EYES: extraocular movement intact (EOMI), pupils equal, round, reactive to light and accommodation, sclera anicteric.   NECK/THYROID: neck supple,no lymphadenopathy, no thyromegaly.   SKIN: no rashes  HEART: S1, S2 normal, no murmurs, rubs, gallops, regular rate and rhythm.   LUNGS: normal effort / no distress, normal breath sounds, clear to auscultation bilaterally, no wheezes, rales, rhonchi.   ABDOMEN: bowel sounds present, no hepatosplenomegaly, soft, nontender, nondistended.   EXTREMITIES: no clubbing, cyanosis, or edema.   NEUROLOGIC: normal.   PSYCH: alert, oriented, cognitive function intact.    Assessment:       1. Type 2 diabetes mellitus with other specified complication, without long-term current use of insulin  - Hemoglobin A1C; Future  - Comprehensive metabolic panel; Future  2. Hyperlipidemia associated with type 2 diabetes mellitus  - Lipid panel; Future    3. Hypertension associated with type 2 diabetes mellitus  - Comprehensive metabolic panel; Future        Plan:   Please see patient instructions sheet for plan.        Follow-Up:   Return in about 3 months (around 01/18/2023) for Diabetes, HTN, Hyperlipidemia.    Patient Instructions   Lab work soon fasting for 9 hours except for water and black coffee.   Try to avoid sugar drinks and sweets, and exercise daily.  Thank you, Roxan Diesel, MD     Shoni Quijas Roxan Diesel, MD

## 2022-10-21 ENCOUNTER — Ambulatory Visit (INDEPENDENT_AMBULATORY_CARE_PROVIDER_SITE_OTHER): Payer: Self-pay

## 2022-10-21 NOTE — Progress Notes (Addendum)
 October 21, 2022    INR:    Lab Results   Component Value Date    INR 2.6 (H) 10/20/2022     via lab draw    INR Goal:  2-3    Warfarin Indication: Paroxysmal AFib/AFlutter     CHA2DS2-VASc Score:  5 HTN (1), Age >/=75 (2), DM (1), and Vascular Dx (1)  HASBLED Score:  1 Elderly (Age>65)    Referring Provider: Dr. Demetrius Revel     Referral Date:  09/06/22     Last visit with referring provider:  09/01/22     PMH:    Past Medical History:   Diagnosis Date    Diabetes mellitus     Hyperlipidemia     Hypertension      Current warfarin dose:    Instructed Dose Reported Dose   Warfarin 5 mg Tues/Thurs/Sun and 7.5 mg all other days.   No deviation from instructed dose.  Denies missed or extras dose(s).   7-day warfarin total:  45 mg 7-day warfarin total:  45 mg     Warfarin Tab Strengths on hand:  5mg     Lab Results   Component Value Date    INR 2.6 (H) 10/20/2022    INR 2.6 (H) 10/13/2022    INR 3.8 (H) 10/05/2022    INR 3.2 (H) 09/28/2022    INR 1.8 (A) 09/22/2022    INR 1.9 (H) 09/08/2022     Dietary intake of vitamin K: Inconsistent. Typically 4-5 servings of green beans and salad (Spring mix, carrots, tomatoes, onions) per week. May have 1 serving of high vitamin K vegetables (collard greens) once monthly.  Patient is in assisted living facility and eats whatever is served at the facility.  Reports to eating one serving of brussel sprouts last week. States he enjoys eating vegetables and doesn't mind being a vegetarian.  Pt inquired about vegetable selections for DM.  Did not keep food diary.   Patient enjoys collard greens and unable to give them up entirely (does not care for Spinach). Patient reports he eats what is served at facility and has little control over the menu.     Alcohol consumption:  No alcohol consumption since 1989.     Tobacco use: Former, quit in 1989     Medication changes:  Denies    New Medication/Dietary/Herbal Interactions: na    Concurrent significant interacting medications/supplements:     Medications that may enhance anticoagulant effect or bleeding risks:  APAP (patient takes prn <2000 mg/day)  Allopurinol  Glucosamine  Omega-3 fatty acids  Medications that may diminish anticoagulant effect:  Vitamin C  Metformin  MVI ([employer] brand, vitamin K 25 mcg).    S/sx of bleeding/clotting:  Denies    Recent Falls:  Denies.   Patient uses cane to assist with mobility.     Upcoming procedures/surgeries:  Denies    Is patient a candidate for DOAC?  Yes    If yes, date and outcome of discussion: 10/06/22: Per pharmacy liaison, copay for Eliquis is $771.30/90 days (periodic deductible $545+coinsurance $226.30). Once deductible met, Eliquis would be ~$75/month. Patient has Medicare and not eligible for copay card.  Patient prefers to continue warfarin for now.    Others: na    ===========================    A/P:    INR 2.6 within therapeutic range.   Continue warfarin 5 mg Tues/Thurs/Sun and 7.5 mg all other days (45mg /wk).  Recheck INR in 2 week(s) on 11/02/22 via lab draw  Counseled patient  on importance of consistency with vitamin K intake. Requested pt to keep a food diary of his vegetable consumption each day.  Discussed the possibility of adjusting warfarin dose to accommodate for increased vit K in diet should pt decide to increase vegetable intake to control BG.  Pt may increase low vitamin K vegetables, but avoid all high vitamin K vegetables at this time.   Warfarin has been managed per Clinic Protocol    Patient verbalized understanding of instructions provided.  Encounter conducted via the telephone.     Burnell Blanks, PharmD, BCPS  Clinical Pharmacy Specialist-Anticoagulation

## 2022-10-26 ENCOUNTER — Telehealth (INDEPENDENT_AMBULATORY_CARE_PROVIDER_SITE_OTHER): Payer: Self-pay

## 2022-10-26 ENCOUNTER — Other Ambulatory Visit (FREE_STANDING_LABORATORY_FACILITY): Payer: Medicare Other

## 2022-10-26 ENCOUNTER — Encounter (INDEPENDENT_AMBULATORY_CARE_PROVIDER_SITE_OTHER): Payer: Self-pay | Admitting: Cardiovascular Disease

## 2022-10-26 DIAGNOSIS — E1159 Type 2 diabetes mellitus with other circulatory complications: Secondary | ICD-10-CM

## 2022-10-26 DIAGNOSIS — Z951 Presence of aortocoronary bypass graft: Secondary | ICD-10-CM

## 2022-10-26 DIAGNOSIS — R0602 Shortness of breath: Secondary | ICD-10-CM

## 2022-10-26 DIAGNOSIS — I152 Hypertension secondary to endocrine disorders: Secondary | ICD-10-CM

## 2022-10-26 DIAGNOSIS — I480 Paroxysmal atrial fibrillation: Secondary | ICD-10-CM

## 2022-10-26 DIAGNOSIS — Z136 Encounter for screening for cardiovascular disorders: Secondary | ICD-10-CM

## 2022-10-26 DIAGNOSIS — G4733 Obstructive sleep apnea (adult) (pediatric): Secondary | ICD-10-CM

## 2022-10-26 DIAGNOSIS — E785 Hyperlipidemia, unspecified: Secondary | ICD-10-CM

## 2022-10-26 DIAGNOSIS — E669 Obesity, unspecified: Secondary | ICD-10-CM

## 2022-10-26 DIAGNOSIS — I2510 Atherosclerotic heart disease of native coronary artery without angina pectoris: Secondary | ICD-10-CM

## 2022-10-26 DIAGNOSIS — E1169 Type 2 diabetes mellitus with other specified complication: Secondary | ICD-10-CM

## 2022-10-26 LAB — COMPREHENSIVE METABOLIC PANEL
ALT: 25 U/L (ref 0–55)
AST (SGOT): 26 U/L (ref 5–41)
Albumin/Globulin Ratio: 1.3 (ref 0.9–2.2)
Albumin: 3.6 g/dL (ref 3.5–5.0)
Alkaline Phosphatase: 82 U/L (ref 37–117)
Anion Gap: 5 (ref 5.0–15.0)
BUN: 14 mg/dL (ref 9.0–28.0)
Bilirubin, Total: 0.8 mg/dL (ref 0.2–1.2)
CO2: 30 mEq/L — ABNORMAL HIGH (ref 17–29)
Calcium: 9.5 mg/dL (ref 7.9–10.2)
Chloride: 105 mEq/L (ref 99–111)
Creatinine: 1 mg/dL (ref 0.5–1.5)
Globulin: 2.7 g/dL (ref 2.0–3.6)
Glucose: 160 mg/dL — ABNORMAL HIGH (ref 70–100)
Potassium: 4.7 mEq/L (ref 3.5–5.3)
Protein, Total: 6.3 g/dL (ref 6.0–8.3)
Sodium: 140 mEq/L (ref 135–145)
eGFR: >60 mL/min/{1.73_m2} (ref >=60)

## 2022-10-26 LAB — LIPID PANEL
Cholesterol / HDL Ratio: 3.2 Index
Cholesterol: 144 mg/dL (ref 0–199)
HDL: 45 mg/dL (ref 40–9999)
LDL Calculated: 75 mg/dL (ref 0–99)
Triglycerides: 121 mg/dL (ref 34–149)
VLDL Calculated: 24 mg/dL (ref 10–40)

## 2022-10-26 LAB — HOLTER MONITOR
# of A-fib episodes: 1
# of PACs: 12700
# of PVC beats in couplets: 2
# of PVCs: 832
# of SVT episodes: 9
# of VT epidoses: 0
# of pauses: 0
% Noise burden: 0.2994 %
% PACs (burden): 2.0366 %
% PVCs (burden): 0.1334 %
Average HR: 62 {beats}/min
Longest SVT Episode: 2.275 s
Max HR: 128 {beats}/min
Min HR: 42 {beats}/min
Time in A-fib (burden): 18.8662 %

## 2022-10-26 LAB — HEMOLYSIS INDEX(SOFT): Hemolysis Index: 11 Index (ref 0–24)

## 2022-10-26 LAB — HEMOGLOBIN A1C
Average Estimated Glucose: 165.7 mg/dL
Hemoglobin A1C: 7.4 % — ABNORMAL HIGH (ref 4.6–5.6)

## 2022-10-26 NOTE — Telephone Encounter (Signed)
[name] informed and verbalized .States he will continue with current medications. He will call us with any new concerns or symptoms.    Medications     acetaminophen (TYLENOL) 500 MG tablet    albuterol sulfate HFA (PROVENTIL) 108 (90 Base) MCG/ACT inhaler  allopurinol (ZYLOPRIM) 300 MG tablet    amLODIPine (NORVASC) 10 MG tablet qd  Ascorbic Acid (vitamin C) 1000 MG tablet    atorvastatin (LIPITOR) 40 MG tablet qd  B Complex Vitamins (vitamin B complex) Tab  cloNIDine (CATAPRES) 0.2 MG tablet     furosemide (LASIX) 20 MG tablet     glipiZIDE (GLUCOTROL) 10 MG 24 hr tablet  Glucosamine-Chondroit-Vit C-Mn (GLUCOSAMINE 1500 COMPLEX PO)  glucose blood (OneTouch Ultra) test strip    hydrALAZINE (APRESOLINE) 25 MG tablet prn    isosorbide mononitrate (IMDUR) 30 MG 24 hr tablet qd  Lancets (OneTouch Delica Plus 0000000) Misc  lisinopril (ZESTRIL) 40 MG tablet qd    metFORMIN (GLUCOPHAGE-XR) 500 MG 24 hr tablet    metoprolol succinate (TOPROL-XL) 100 MG 24 hr tablet qd  Multiple Vitamin (Multi-Vitamin) tablet    nitroglycerin (NITROSTAT) 0.4 MG SL tablet not needed it  Omega-3 Fatty Acids (fish oil) 1000 MG Cap capsule  Vitamin D, Cholecalciferol, 50 MCG (2000 UT) Cap    warfarin (COUMADIN) 5 MG tablet

## 2022-10-26 NOTE — Telephone Encounter (Addendum)
-----   Message from Brunetta Jeans, MD sent at 10/25/2022  8:11 PM EST -----  Nuke stress small area of mild inferolateral ischemia. As long as no change since last visit we will treat with current medications. 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 If you need help understanding this let me know,.          lmtcb

## 2022-11-01 ENCOUNTER — Ambulatory Visit (INDEPENDENT_AMBULATORY_CARE_PROVIDER_SITE_OTHER): Payer: Medicare Other

## 2022-11-01 DIAGNOSIS — I10 Essential (primary) hypertension: Secondary | ICD-10-CM

## 2022-11-01 DIAGNOSIS — E785 Hyperlipidemia, unspecified: Secondary | ICD-10-CM

## 2022-11-02 ENCOUNTER — Other Ambulatory Visit (FREE_STANDING_LABORATORY_FACILITY): Payer: Medicare Other

## 2022-11-02 DIAGNOSIS — I4892 Unspecified atrial flutter: Secondary | ICD-10-CM

## 2022-11-02 LAB — PT/INR
PT INR: 2.2 — ABNORMAL HIGH (ref 0.9–1.1)
PT: 25.6 s — ABNORMAL HIGH (ref 10.1–12.9)

## 2022-11-03 ENCOUNTER — Ambulatory Visit (INDEPENDENT_AMBULATORY_CARE_PROVIDER_SITE_OTHER): Payer: Self-pay

## 2022-11-03 NOTE — Progress Notes (Signed)
November 03, 2022    INR:    Lab Results   Component Value Date    INR 2.2 (H) 11/02/2022     via lab draw    INR Goal:  2-3    Warfarin Indication: Paroxysmal AFib/AFlutter     CHA2DS2-VASc Score:  5 HTN (1), Age >/=75 (2), DM (1), and Vascular Dx (1)  HASBLED Score:  1 Elderly (Age>65)    Referring Provider: Dr. Benjaman Lobe     Referral Date:  09/06/22     Last visit with referring provider:  09/01/22     PMH:    Past Medical History:   Diagnosis Date    Diabetes mellitus     Hyperlipidemia     Hypertension      Current warfarin dose:    Instructed Dose Reported Dose   Warfarin 5 mg Tues/Thurs/Sun and 7.5 mg all other days.   No deviation from instructed dose.  Denies missed or extras dose(s).   7-day warfarin total:  45 mg 7-day warfarin total:  45 mg     Warfarin Tab Strengths on hand:  '5mg'$     Lab Results   Component Value Date    INR 2.2 (H) 11/02/2022    INR 2.6 (H) 10/20/2022    INR 2.6 (H) 10/13/2022    INR 3.8 (H) 10/05/2022    INR 3.2 (H) 09/28/2022    INR 1.8 (A) 09/22/2022     Dietary intake of vitamin K: Inconsistent. Typically 4-5 servings of green beans and salad (Spring mix, carrots, tomatoes, onions) per week. May have 1 serving of high vitamin K vegetables (collard greens) once monthly.  Patient is in assisted living facility and eats whatever is served at the facility.  States he enjoys eating vegetables and doesn't mind being a vegetarian.  Pt reports at least 3 servings of vegetables and no collard greens this past week.  Did not keep food diary   Patient enjoys collard greens and unable to give them up entirely (does not care for Spinach). Patient reports he eats what is served at facility and has little control over the menu.     Alcohol consumption:  No alcohol consumption since 1989.     Tobacco use: Former, quit in 1989     Medication changes:  Denies    New Medication/Dietary/Herbal Interactions: na    Concurrent significant interacting medications/supplements:    Medications that may  enhance anticoagulant effect or bleeding risks:  APAP (patient takes prn <2000 mg/day)  Allopurinol  Glucosamine  Omega-3 fatty acids  Medications that may diminish anticoagulant effect:  Vitamin C  Metformin  MVI ([employer] brand, vitamin K 25 mcg).    S/sx of bleeding/clotting:  Denies    Recent Falls:  Denies.   Patient uses cane to assist with mobility.     Upcoming procedures/surgeries:  Denies    Is patient a candidate for DOAC?  Yes    If yes, date and outcome of discussion: 10/06/22: Per pharmacy liaison, copay for Eliquis is $771.30/90 days (periodic deductible $545+coinsurance $226.30). Once deductible met, Eliquis would be ~$75/month. Patient has Medicare and not eligible for copay card.  Patient prefers to continue warfarin for now.    Others: na    ===========================    A/P:    INR 2.2 within therapeutic range.   Continue warfarin 5 mg Tues/Thurs/Sun and 7.5 mg all other days ('45mg'$ /wk).  Recheck INR in 2 week(s) on 11/16/22 via lab draw  Counseled on consistent vit  K in diet, and avoid high vitamin K vegetables such as collard green.  Encouraged patient to keep food diary of vegetable intake during the week to better manage his Warfarin.  Warfarin has been managed per Clinic Protocol    Patient verbalized understanding of instructions provided.  Encounter conducted via the telephone.       Interview conducted by Leotis Shames, PharmD Candidate    Pharmacy Preceptor Attestation: I attest that all information presented by Leotis Shames, PharmD Candidate, is accurate and has been reviewed. I agree with all findings as documented in this encounter.     Ola Spurr, PharmD, BCPS  Clinical Pharmacy Specialist-Anticoagulation

## 2022-11-17 ENCOUNTER — Telehealth (INDEPENDENT_AMBULATORY_CARE_PROVIDER_SITE_OTHER): Payer: Self-pay | Admitting: Family Medicine

## 2022-11-17 ENCOUNTER — Other Ambulatory Visit (FREE_STANDING_LABORATORY_FACILITY): Payer: Medicare Other

## 2022-11-17 ENCOUNTER — Other Ambulatory Visit (INDEPENDENT_AMBULATORY_CARE_PROVIDER_SITE_OTHER): Payer: Self-pay | Admitting: Family Medicine

## 2022-11-17 DIAGNOSIS — E1169 Type 2 diabetes mellitus with other specified complication: Secondary | ICD-10-CM

## 2022-11-17 DIAGNOSIS — I4892 Unspecified atrial flutter: Secondary | ICD-10-CM

## 2022-11-17 LAB — PT/INR
PT INR: 2.1 — ABNORMAL HIGH (ref 0.9–1.1)
PT: 24.4 s — ABNORMAL HIGH (ref 10.1–12.9)

## 2022-11-17 MED ORDER — ONETOUCH DELICA PLUS LANCET33G MISC
1.0000 [IU] | Freq: Every day | 5 refills | Status: DC
Start: 2022-11-17 — End: 2023-09-10

## 2022-11-17 NOTE — Telephone Encounter (Signed)
Refill req sent to pcp

## 2022-11-17 NOTE — Telephone Encounter (Signed)
Pt came into office and is requesting for medication to be refilled. States he only has about 2-3 days left of medication.    Lancets (OneTouch Delica Plus 0000000) Misc     Please advise

## 2022-11-18 ENCOUNTER — Ambulatory Visit (INDEPENDENT_AMBULATORY_CARE_PROVIDER_SITE_OTHER): Payer: Self-pay

## 2022-11-18 NOTE — Progress Notes (Signed)
November 18, 2022    INR:    Lab Results   Component Value Date    INR 2.1 (H) 11/17/2022     via lab draw    INR Goal:  2-3    Warfarin Indication: Paroxysmal AFib/AFlutter     CHA2DS2-VASc Score:  5 HTN (1), Age >/=75 (2), DM (1), and Vascular Dx (1)  HASBLED Score:  1 Elderly (Age>65)    Referring Provider: Dr. Benjaman Lobe     Referral Date:  09/06/22     Last visit with referring provider:  09/01/22     PMH:    Past Medical History:   Diagnosis Date    Diabetes mellitus     Hyperlipidemia     Hypertension      Current warfarin dose:    Instructed Dose Reported Dose   Warfarin 5 mg Tues/Thurs/Sun and 7.5 mg all other days.   No deviation from instructed dose.  Denies missed or extras dose(s).   7-day warfarin total:  45 mg 7-day warfarin total:  45 mg     Warfarin Tab Strengths on hand:  5mg     Lab Results   Component Value Date    INR 2.1 (H) 11/17/2022    INR 2.2 (H) 11/02/2022    INR 2.6 (H) 10/20/2022    INR 2.6 (H) 10/13/2022    INR 3.8 (H) 10/05/2022    INR 3.2 (H) 09/28/2022     Dietary intake of vitamin K: Inconsistent. Typically 4-5 servings of green beans and salad (Spring mix, carrots, tomatoes, onions) per week. May have 1 serving of high vitamin K vegetables (collard greens) once monthly.  Patient is in assisted living facility and eats whatever is served at the facility. Reports no significant change in diet, still watching what he eats, avoiding collard greens.     Alcohol consumption:  No alcohol consumption since 1989.     Tobacco use: Former, quit in 1989     Medication changes:  Denies    New Medication/Dietary/Herbal Interactions: na    Concurrent significant interacting medications/supplements:    Medications that may enhance anticoagulant effect or bleeding risks:  APAP (patient takes prn <2000 mg/day)  Allopurinol  Glucosamine  Omega-3 fatty acids  Medications that may diminish anticoagulant effect:  Vitamin C  Metformin  MVI ([employer] brand, vitamin K 25 mcg).    S/sx of bleeding/clotting:   Denies    Recent Falls:  Denies.   Patient uses cane to assist with mobility.     Upcoming procedures/surgeries:  Denies    Is patient a candidate for DOAC?  Yes    If yes, date and outcome of discussion: 10/06/22: Per pharmacy liaison, copay for Eliquis is $771.30/90 days (periodic deductible $545+coinsurance $226.30). Once deductible met, Eliquis would be ~$75/month. Patient has Medicare and not eligible for copay card.  Patient prefers to continue warfarin for now.    Others: na    ===========================    A/P:    INR 2.1 within therapeutic range.   Continue warfarin 5 mg Tues/Thurs/Sun and 7.5 mg all other days (45mg /wk).  Recheck INR in 2 week(s) on 12/01/22 via lab draw  Reminded pt the importance of consistent vit K diet, and to avoid high vitamin K vegetables such as collard green.  Warfarin has been managed per Clinic Protocol    Patient verbalized understanding of instructions provided.  Encounter conducted via the telephone.     Verdene Lennert, [facility]  Cosigned by:    Ola Spurr, PharmD, [place]

## 2022-11-24 ENCOUNTER — Other Ambulatory Visit (INDEPENDENT_AMBULATORY_CARE_PROVIDER_SITE_OTHER): Payer: Self-pay

## 2022-11-24 ENCOUNTER — Telehealth (INDEPENDENT_AMBULATORY_CARE_PROVIDER_SITE_OTHER): Payer: Self-pay | Admitting: Family Medicine

## 2022-11-24 DIAGNOSIS — E1169 Type 2 diabetes mellitus with other specified complication: Secondary | ICD-10-CM

## 2022-11-24 MED ORDER — METFORMIN HCL ER 500 MG PO TB24
1000.0000 mg | ORAL_TABLET | Freq: Two times a day (BID) | ORAL | 0 refills | Status: DC
Start: 2022-11-24 — End: 2023-02-08

## 2022-11-24 NOTE — Telephone Encounter (Signed)
Pending and routing refill for approval from  Dr Rodrigo Ran

## 2022-11-24 NOTE — Telephone Encounter (Signed)
Pt came in requesting for pcp to send medication refill to be sent to pharmacy . Pt only has medication to last until tomorrow   metFORMIN (GLUCOPHAGE-XR) 500 MG 24 hr tablet     Please refill ASAP

## 2022-11-29 ENCOUNTER — Other Ambulatory Visit (INDEPENDENT_AMBULATORY_CARE_PROVIDER_SITE_OTHER): Payer: Self-pay | Admitting: Family Medicine

## 2022-11-30 ENCOUNTER — Telehealth (INDEPENDENT_AMBULATORY_CARE_PROVIDER_SITE_OTHER): Payer: Self-pay | Admitting: Nurse Practitioner

## 2022-11-30 ENCOUNTER — Other Ambulatory Visit (FREE_STANDING_LABORATORY_FACILITY): Payer: Medicare Other

## 2022-11-30 DIAGNOSIS — I4892 Unspecified atrial flutter: Secondary | ICD-10-CM

## 2022-11-30 LAB — PT/INR
PT INR: 2.5 — ABNORMAL HIGH (ref 0.9–1.1)
PT: 29.6 s — ABNORMAL HIGH (ref 10.1–12.9)

## 2022-11-30 NOTE — Telephone Encounter (Signed)
LVM to cancel the app since Senait,NP won't be in the office due her emergency situation!

## 2022-12-01 ENCOUNTER — Ambulatory Visit (INDEPENDENT_AMBULATORY_CARE_PROVIDER_SITE_OTHER): Payer: Self-pay

## 2022-12-01 ENCOUNTER — Ambulatory Visit (INDEPENDENT_AMBULATORY_CARE_PROVIDER_SITE_OTHER): Payer: Medicare Other

## 2022-12-01 ENCOUNTER — Ambulatory Visit (INDEPENDENT_AMBULATORY_CARE_PROVIDER_SITE_OTHER): Payer: Medicare Other | Admitting: Nurse Practitioner

## 2022-12-01 DIAGNOSIS — E785 Hyperlipidemia, unspecified: Secondary | ICD-10-CM

## 2022-12-01 DIAGNOSIS — G4733 Obstructive sleep apnea (adult) (pediatric): Secondary | ICD-10-CM

## 2022-12-01 DIAGNOSIS — Z136 Encounter for screening for cardiovascular disorders: Secondary | ICD-10-CM

## 2022-12-01 DIAGNOSIS — I2510 Atherosclerotic heart disease of native coronary artery without angina pectoris: Secondary | ICD-10-CM

## 2022-12-01 DIAGNOSIS — Z951 Presence of aortocoronary bypass graft: Secondary | ICD-10-CM

## 2022-12-01 DIAGNOSIS — E669 Obesity, unspecified: Secondary | ICD-10-CM

## 2022-12-01 DIAGNOSIS — I480 Paroxysmal atrial fibrillation: Secondary | ICD-10-CM

## 2022-12-01 DIAGNOSIS — I10 Essential (primary) hypertension: Secondary | ICD-10-CM

## 2022-12-01 DIAGNOSIS — R0602 Shortness of breath: Secondary | ICD-10-CM

## 2022-12-01 MED ORDER — WARFARIN SODIUM 5 MG PO TABS
5.0000 mg | ORAL_TABLET | Freq: Every day | ORAL | 0 refills | Status: DC
Start: 2022-12-01 — End: 2022-12-12

## 2022-12-01 NOTE — Progress Notes (Signed)
December 01, 2022    INR:    Lab Results   Component Value Date    INR 2.5 (H) 11/30/2022     via lab draw    INR Goal:  2-3    Warfarin Indication: Paroxysmal AFib/AFlutter     CHA2DS2-VASc Score:  5 HTN (1), Age >/=75 (2), DM (1), and Vascular Dx (1)  HASBLED Score:  1 Elderly (Age>65)    Referring Provider: Dr. Benjaman Lobe     Referral Date:  09/06/22     Last visit with referring provider:  09/01/22     PMH:    Past Medical History:   Diagnosis Date    Diabetes mellitus     Hyperlipidemia     Hypertension      Current warfarin dose:    Instructed Dose Reported Dose   Warfarin 5 mg Tues/Thurs/Sun and 7.5 mg all other days.   No deviation from instructed dose.  Denies missed or extras dose(s).   7-day warfarin total:  45 mg 7-day warfarin total:  45 mg     Warfarin Tab Strengths on hand:  5mg  (Rx 12/01/22 #120, RF 0)    Lab Results   Component Value Date    INR 2.5 (H) 11/30/2022    INR 2.1 (H) 11/17/2022    INR 2.2 (H) 11/02/2022    INR 2.6 (H) 10/20/2022    INR 2.6 (H) 10/13/2022    INR 3.8 (H) 10/05/2022     Dietary intake of vitamin K: Inconsistent. Typically 4-5 servings of green beans and salad (Spring mix, carrots, tomatoes, onions) per week. May have 1 serving of high vitamin K vegetables (collard greens) once monthly.  Patient is in assisted living facility and eats whatever is served at the facility. Reports no significant change in diet, still watching what he eats, avoiding collard greens.     Alcohol consumption:  No alcohol consumption since 1989.     Tobacco use: Former, quit in 1989     Medication changes:  Denies    New Medication/Dietary/Herbal Interactions: na    Concurrent significant interacting medications/supplements:    Medications that may enhance anticoagulant effect or bleeding risks:  APAP (patient takes prn <2000 mg/day)  Allopurinol  Glucosamine  Omega-3 fatty acids  Medications that may diminish anticoagulant effect:  Vitamin C  Metformin  MVI ([employer] brand, vitamin K 25 mcg).    S/sx  of bleeding/clotting:  Denies    Recent Falls:  Denies.   Patient uses cane to assist with mobility.     Upcoming procedures/surgeries:  Denies    Is patient a candidate for DOAC?  Yes    If yes, date and outcome of discussion: 10/06/22: Per pharmacy liaison, copay for Eliquis is $771.30/90 days (periodic deductible $545+coinsurance $226.30). Once deductible met, Eliquis would be ~$75/month. Patient has Medicare and not eligible for copay card.  Patient prefers to continue warfarin for now.    Others: na    ===========================    A/P:    INR 2.5 within therapeutic range.   Continue warfarin 5 mg Tues/Thurs/Sun and 7.5 mg all other days (45mg /wk).  Recheck INR in 4 week(s) on 12/28/22 via lab draw  Reminded pt the importance of consistent vit K diet, and to avoid high vitamin K vegetables such as collard green.  Warfarin 5mg  Rx sent to pharmacy  Warfarin has been managed per Clinic Protocol    Patient verbalized understanding of instructions provided.  Encounter conducted via the telephone.  Ola Spurr, PharmD, BCPS  Clinical Pharmacy Specialist-Anticoagulation

## 2022-12-01 NOTE — Progress Notes (Deleted)
[facility] [place] OFFICE VISIT    I had the pleasure of seeing Carl [name] today for cardiovascular follow up. He is a pleasant 82 y.o. male with a history of PAF. CABG 2005, had heart burn in chest. who presents ***     MEDICATIONS:   Current Outpatient Medications:     acetaminophen (TYLENOL) 500 MG tablet, Take 1 tablet (500 mg) by mouth every 6 (six) hours as needed (Patient not taking: Reported on 10/03/2022), Disp: , Rfl:     albuterol sulfate HFA (PROVENTIL) 108 (90 Base) MCG/ACT inhaler, INHALE TWO PUFFS BY MOUTH EVERY 6 HOURS AS NEEDED FOR WHEEZING OR SHORTNESS OF BREATH, Disp: , Rfl:     allopurinol (ZYLOPRIM) 300 MG tablet, Take 1 tablet (300 mg) by mouth daily, Disp: , Rfl:     amLODIPine (NORVASC) 10 MG tablet, Take 1 tablet (10 mg) by mouth daily, Disp: , Rfl:     Ascorbic Acid (vitamin C) 1000 MG tablet, Take 1 tablet (1,000 mg) by mouth daily, Disp: , Rfl:     atorvastatin (LIPITOR) 40 MG tablet, Take 1 tablet (40 mg) by mouth daily, Disp: , Rfl:     B Complex Vitamins (vitamin B complex) Tab, Take 1 tablet by mouth daily, Disp: , Rfl:     cloNIDine (CATAPRES) 0.2 MG tablet, Take 1 tablet (0.2 mg) by mouth 2 (two) times daily, Disp: , Rfl:     furosemide (LASIX) 20 MG tablet, Take 1 tablet (20 mg) by mouth daily, Disp: 90 tablet, Rfl: 0    glipiZIDE (GLUCOTROL) 10 MG 24 hr tablet, Take 1 tablet (10 mg) by mouth daily, Disp: 90 tablet, Rfl: 3    Glucosamine-Chondroit-Vit C-Mn (GLUCOSAMINE 1500 COMPLEX PO), Take 2 tablets by mouth daily, Disp: , Rfl:     glucose blood (OneTouch Ultra) test strip, 1 each by Other route daily Use as instructed, Disp: 100 strip, Rfl: 5    hydrALAZINE (APRESOLINE) 25 MG tablet, Take 1 tablet (25 mg) by mouth 3 (three) times daily Takes as needed, Disp: , Rfl:     isosorbide mononitrate (IMDUR) 30 MG 24 hr tablet, Take 1 tablet (30 mg) by mouth daily, Disp: , Rfl:     Lancets (OneTouch Delica Plus 0000000) Misc, Inject 1 Unit into the skin daily USE DAILY AS  DIRECTED, Disp: 100 each, Rfl: 5    lisinopril (ZESTRIL) 40 MG tablet, Take 1 tablet (40 mg) by mouth daily, Disp: , Rfl:     metFORMIN (GLUCOPHAGE-XR) 500 MG 24 hr tablet, Take 2 tablets (1,000 mg) by mouth 2 (two) times daily, Disp: 360 tablet, Rfl: 0    metoprolol succinate (TOPROL-XL) 100 MG 24 hr tablet, TAKE ONE TABLET BY MOUTH DAILY WITH OR IMMEDIATELY FOLLOWING A MEAL; **MUST CALL MD FOR APPOINTMENT FOR FURTHER REFILLS, Disp: , Rfl:     Multiple Vitamin (Multi-Vitamin) tablet, Take 1 tablet by mouth daily, Disp: , Rfl:     nitroglycerin (NITROSTAT) 0.4 MG SL tablet, Place 1 tablet (0.4 mg) under the tongue every 5 (five) minutes as needed for Chest pain, Disp: 30 tablet, Rfl: 5    Omega-3 Fatty Acids (fish oil) 1000 MG Cap capsule, Take 1 capsule (1,000 mg) by mouth 2 (two) times daily, Disp: , Rfl:     Vitamin D, Cholecalciferol, 50 MCG (2000 UT) Cap, Take 2,000 Units by mouth daily, Disp: , Rfl:     warfarin (COUMADIN) 5 MG tablet, Take 1 tablet (5 mg) by mouth daily Take 5 mg Sunday,  Tuesday, Thursday Take 7.5 mg Monday, Wednesday, Friday, saturday, Disp: 90 tablet, Rfl: 0     PHYSICAL EXAMINATION  Health Related Quality of Life:     Vital Signs: There were no vitals taken for this visit.   Chest: Clear to auscultation bilaterally  Cardiovascular: No murmurs or gallops.   Abdomen: Soft, nontender. No pulsatile masses or bruits.    Extremities: Warm without edema. Peripheral pulses are full and equal.    ECG: {ekg findings:315101::"normal sinus rhythm at *** bpm","normal axis and intervals","no significant ST/T changes","normal EKG","no significant change from prior"}.   Transthoracic Echocardiogram(TTE) 09/22/2022    Summary    * The left ventricle is normal in size.    * There is normal left ventricular geometry.    * Left ventricular systolic function is normal with an ejection fraction by  Biplane Method of Discs of  67 %.    * Left ventricular segmental wall motion is normal.    * Left ventricular  diastolic filling parameters demonstrate normal diastolic  function.    * The right ventricular cavity size is normal in size.    * Normal right ventricular systolic function.    * The left atrium is mildly dilated.    * There is mild aortic valve sclerosis/fibrocalcific changes vs mild aortic  stenosis.    * No pulmonary hypertension with estimated right ventricular systolic  pressure of  21 mmHg.    * There is moderate mitral annular calcification.    * No prior study is available for comparison.    Holter Monitor 3 to 7 days      Interpretation Summary    Predominant rhythm is normal sinus rhythm.   * 18.87 % of Atrial fibrillation/Atrial flutter with longest episode of 1d 7h.     * 9 supraventricular episodes were found. Longest SVT Episode 5 beats     Overall PVC Burden at 0.13 %  Overall PSVC Burden at 2.04 %     There is a total of 0 patient events.    Nuclear Stress Test 09/30/2022  Summary    1. There is a small sized area of decreased activity in the mid  inferolateral wall which is mild in intensity and reversible on rest images.  This is consistent with a small area of ischemia in the territory of the LCx  or RCA.    2. Gated wall motion study demonstrates normal left ventricular function  with calculated ejection fraction >65% at rest and post stress.    3. Frequent unifocal PVCs.    4. No prior studies are available for comparison.  LABS:   Lab Results   Component Value Date    WBC 10.01 (H) 09/01/2022    HGB 14.5 09/01/2022    HCT 42.7 09/01/2022    PLT 268 09/01/2022    NA 140 10/26/2022    K 4.7 10/26/2022    MG 1.8 09/01/2022    BUN 14.0 10/26/2022    CREAT 1.0 10/26/2022    GLU 160 (H) 10/26/2022    CHOL 144 10/26/2022    TRIG 121 10/26/2022    HDL 45 10/26/2022    LDL 75 10/26/2022    AST 26 10/26/2022    ALT 25 10/26/2022    HGBA1C 7.4 (H) 10/26/2022    TSH 1.17 07/20/2022        IMPRESSION/RECOMMENDATIONS: Carl [name] is a 82 y.o. male who presents for follow up. ***    ***

## 2022-12-03 ENCOUNTER — Other Ambulatory Visit: Payer: Self-pay | Admitting: Cardiology

## 2022-12-05 NOTE — Telephone Encounter (Signed)
Per 08/05/22 telephone note pt has moved and has a new Cardiologist, Dr. Gershon Crane in [place]. Pt will need to contact them for warfarin refills as they are monitoring INR & dosing warfarin.

## 2022-12-07 ENCOUNTER — Ambulatory Visit (INDEPENDENT_AMBULATORY_CARE_PROVIDER_SITE_OTHER): Payer: Medicare Other

## 2022-12-07 DIAGNOSIS — I10 Essential (primary) hypertension: Secondary | ICD-10-CM

## 2022-12-07 DIAGNOSIS — E785 Hyperlipidemia, unspecified: Secondary | ICD-10-CM

## 2022-12-11 ENCOUNTER — Other Ambulatory Visit: Payer: Self-pay | Admitting: Cardiology

## 2022-12-12 ENCOUNTER — Other Ambulatory Visit (INDEPENDENT_AMBULATORY_CARE_PROVIDER_SITE_OTHER): Payer: Self-pay

## 2022-12-12 DIAGNOSIS — R0602 Shortness of breath: Secondary | ICD-10-CM

## 2022-12-12 DIAGNOSIS — Z951 Presence of aortocoronary bypass graft: Secondary | ICD-10-CM

## 2022-12-12 DIAGNOSIS — E669 Obesity, unspecified: Secondary | ICD-10-CM

## 2022-12-12 DIAGNOSIS — E785 Hyperlipidemia, unspecified: Secondary | ICD-10-CM

## 2022-12-12 DIAGNOSIS — Z136 Encounter for screening for cardiovascular disorders: Secondary | ICD-10-CM

## 2022-12-12 DIAGNOSIS — I480 Paroxysmal atrial fibrillation: Secondary | ICD-10-CM

## 2022-12-12 DIAGNOSIS — I2510 Atherosclerotic heart disease of native coronary artery without angina pectoris: Secondary | ICD-10-CM

## 2022-12-12 DIAGNOSIS — G4733 Obstructive sleep apnea (adult) (pediatric): Secondary | ICD-10-CM

## 2022-12-12 MED ORDER — WARFARIN SODIUM 5 MG PO TABS
5.0000 mg | ORAL_TABLET | Freq: Every day | ORAL | 0 refills | Status: DC
Start: 2022-12-12 — End: 2022-12-12

## 2022-12-12 MED ORDER — WARFARIN SODIUM 5 MG PO TABS
7.5000 mg | ORAL_TABLET | Freq: Every evening | ORAL | 2 refills | Status: DC
Start: 2022-12-12 — End: 2022-12-12

## 2022-12-12 MED ORDER — WARFARIN SODIUM 5 MG PO TABS
7.5000 mg | ORAL_TABLET | Freq: Every evening | ORAL | 2 refills | Status: DC
Start: 2022-12-12 — End: 2023-07-21

## 2022-12-12 NOTE — Addendum Note (Signed)
Addended by: Burnell Blanks on: 12/12/2022 01:35 PM     Modules accepted: Orders

## 2022-12-12 NOTE — Addendum Note (Signed)
Addended by: Burnell Blanks on: 12/12/2022 02:12 PM     Modules accepted: Orders

## 2022-12-14 ENCOUNTER — Other Ambulatory Visit (INDEPENDENT_AMBULATORY_CARE_PROVIDER_SITE_OTHER): Payer: Self-pay | Admitting: Hospitalist

## 2022-12-16 ENCOUNTER — Telehealth: Payer: Self-pay | Admitting: Cardiovascular Disease

## 2022-12-16 ENCOUNTER — Other Ambulatory Visit (HOSPITAL_BASED_OUTPATIENT_CLINIC_OR_DEPARTMENT_OTHER): Payer: Self-pay

## 2022-12-16 MED ORDER — FUROSEMIDE 20 MG PO TABS
20.0000 mg | ORAL_TABLET | Freq: Every day | ORAL | 0 refills | Status: DC
Start: 2022-12-16 — End: 2022-12-20

## 2022-12-16 NOTE — Telephone Encounter (Signed)
Prescription Refill Checklist    Completed Process Reviewed Notes   [x]  Verified med on pt chart.    [x]  2.  Reviewed patient allergies    [x]  3.  Review last encounters since OV    [x]  4.  Determine if pt has f/u as instructed or needs OV    []  5.  If OV required, pend 30 day supply of medication    []  6.  Review labs are up to date.    []  7. Filled by RN 90 day x 0 May only be done 1 x yearly   DATE:         Reviewed -  JR    Last appt- 09/01/2022  Next appt - 12/23/2022

## 2022-12-16 NOTE — Telephone Encounter (Signed)
Refill request sent to [facility] for approval

## 2022-12-16 NOTE — Telephone Encounter (Signed)
Pt calling for refill on furosemide (LASIX) 20 MG tablet pt states he only has a couple days of the medication left and he is going out of town on 4/14 and he needs medication to take with him. Pt is scheduled to see Dr. Nile Riggs on 4/19.    Pharmacy:  [id number] [place], [place] - [address] Phone: [phone]   Fax: [phone]

## 2022-12-19 ENCOUNTER — Other Ambulatory Visit (INDEPENDENT_AMBULATORY_CARE_PROVIDER_SITE_OTHER): Payer: Self-pay | Admitting: Nurse Practitioner

## 2022-12-23 ENCOUNTER — Ambulatory Visit (INDEPENDENT_AMBULATORY_CARE_PROVIDER_SITE_OTHER): Payer: Self-pay

## 2022-12-23 ENCOUNTER — Encounter (INDEPENDENT_AMBULATORY_CARE_PROVIDER_SITE_OTHER): Payer: Self-pay | Admitting: Cardiovascular Disease

## 2022-12-23 ENCOUNTER — Ambulatory Visit
Admission: RE | Admit: 2022-12-23 | Discharge: 2022-12-23 | Disposition: A | Discharge status: Home / Self Care | Payer: Medicare Other | Source: Ambulatory Visit | Attending: Cardiovascular Disease | Admitting: Cardiovascular Disease

## 2022-12-23 ENCOUNTER — Ambulatory Visit (INDEPENDENT_AMBULATORY_CARE_PROVIDER_SITE_OTHER): Payer: Medicare Other | Admitting: Cardiovascular Disease

## 2022-12-23 ENCOUNTER — Other Ambulatory Visit: Payer: Medicare Other

## 2022-12-23 VITALS — BP 125/73 | HR 70 | Ht 69.0 in | Wt 213.0 lb

## 2022-12-23 DIAGNOSIS — I480 Paroxysmal atrial fibrillation: Secondary | ICD-10-CM

## 2022-12-23 DIAGNOSIS — R0602 Shortness of breath: Secondary | ICD-10-CM

## 2022-12-23 DIAGNOSIS — I4892 Unspecified atrial flutter: Secondary | ICD-10-CM

## 2022-12-23 DIAGNOSIS — I2510 Atherosclerotic heart disease of native coronary artery without angina pectoris: Secondary | ICD-10-CM | POA: Insufficient documentation

## 2022-12-23 DIAGNOSIS — Z0000 Encounter for general adult medical examination without abnormal findings: Secondary | ICD-10-CM

## 2022-12-23 DIAGNOSIS — R600 Localized edema: Secondary | ICD-10-CM

## 2022-12-23 LAB — CBC AND DIFFERENTIAL
Absolute NRBC: 0 10*3/uL (ref 0.00–0.00)
Basophils Absolute Automated: 0.04 10*3/uL (ref 0.00–0.08)
Basophils Automated: 0.4 %
Eosinophils Absolute Automated: 0.14 10*3/uL (ref 0.00–0.44)
Eosinophils Automated: 1.3 %
Hematocrit: 41.4 % (ref 37.6–49.6)
Hgb: 14.1 g/dL (ref 12.5–17.1)
Immature Granulocytes Absolute: 0.03 10*3/uL (ref 0.00–0.07)
Immature Granulocytes: 0.3 %
Instrument Absolute Neutrophil Count: 7.18 10*3/uL — ABNORMAL HIGH (ref 1.10–6.33)
Lymphocytes Absolute Automated: 2.36 10*3/uL (ref 0.42–3.22)
Lymphocytes Automated: 22.1 %
MCH: 32.1 pg (ref 25.1–33.5)
MCHC: 34.1 g/dL (ref 31.5–35.8)
MCV: 94.3 fL (ref 78.0–96.0)
MPV: 11.3 fL (ref 8.9–12.5)
Monocytes Absolute Automated: 0.94 10*3/uL — ABNORMAL HIGH (ref 0.21–0.85)
Monocytes: 8.8 %
Neutrophils Absolute: 7.18 10*3/uL — ABNORMAL HIGH (ref 1.10–6.33)
Neutrophils: 67.1 %
Nucleated RBC: 0 /100 WBC (ref 0.0–0.0)
Platelets: 231 10*3/uL (ref 142–346)
RBC: 4.39 10*6/uL (ref 4.20–5.90)
RDW: 14 % (ref 11–15)
WBC: 10.69 10*3/uL — ABNORMAL HIGH (ref 3.10–9.50)

## 2022-12-23 LAB — ECG 12-LEAD
Q-T Interval: 418 ms
QRS Duration: 82 ms
QTC Calculation (Bezet): 431 ms
R Axis: 33 degrees
T Axis: 15 degrees
Ventricular Rate: 64 {beats}/min

## 2022-12-23 LAB — BASIC METABOLIC PANEL
Anion Gap: 8 (ref 5.0–15.0)
BUN: 15 mg/dL (ref 9.0–28.0)
CO2: 28 mEq/L (ref 17–29)
Calcium: 9.7 mg/dL (ref 7.9–10.2)
Chloride: 106 mEq/L (ref 99–111)
Creatinine: 1 mg/dL (ref 0.5–1.5)
Glucose: 140 mg/dL — ABNORMAL HIGH (ref 70–100)
Potassium: 4.8 mEq/L (ref 3.5–5.3)
Sodium: 142 mEq/L (ref 135–145)
eGFR: >60 mL/min/{1.73_m2} (ref >=60)

## 2022-12-23 LAB — PT/INR
PT INR: 2.7 — ABNORMAL HIGH (ref 0.9–1.1)
PT: 31.2 s — ABNORMAL HIGH (ref 10.1–12.9)

## 2022-12-23 LAB — HEMOLYSIS INDEX: Hemolysis Index: 11 Index (ref 0–24)

## 2022-12-23 LAB — MAGNESIUM: Magnesium: 1.7 mg/dL (ref 1.6–2.6)

## 2022-12-23 LAB — NT-PROBNP: NT-proBNP: 1338 pg/mL — ABNORMAL HIGH (ref 0–450)

## 2022-12-23 NOTE — Patient Instructions (Addendum)
Blood work  CXR  Ultrasound of legs  CT coronary angiogram  Nurse practitioner follow up 6 weeks      Try to lose 10 lbs in the next 6 months  1500-1750 calories a day to lose weight  Low salt diet  Reduce saturated fats  Reduce carbohydrates            Check blood pressure once a day for 7 to 10 days, after relaxing for 5 minutes and than 2-3 minutes later repeat again.      When you check your blood pressure once a day, change the time of day from previous days so we can understand if you blood pressure fluctuates during the day.    Your ideal goal blood pressure is less than 130/80 mm Hg as long as you are not dizzy or tired at this blood pressure.    Call if your average blood pressure is higher than 130/80 to adjust your medications    You can purchase an Omron blood pressure cuff for your upper arm if you do not have one.    Low salt diet less than 2000 mg per day.    Avoid alcohol which raises blood pressure    Weight loss reduces blood pressure

## 2022-12-23 NOTE — Progress Notes (Signed)
[name] Consultation      Referring Physician: Flonnie Overman, [place], MD    I had the pleasure of seeing [name] today for assumption of CV care. [place].  He is a 82 y.o. with a history of PAF.  CABG 2005, had heart burn in chest.     Tums usually relieves his heart burn episgastric.  Last few years sob occasionally at rest, and resolved with inhaler.  No cp or sob with yoga,  Walks with a cane and can walk a block.  No cp or sob walking in from parking lot.  No syncope.  No dizziness.  Rare single skipped beat.  A week ago had heart burn after a meal with hot sauce.  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, small MI  No CHF  No CVA  Had DM, HTN, PAF, HLD    PAST SURGICAL HISTORY:   Past Surgical History:   Procedure Laterality Date    CARDIAC SURGERY      TONSILLECTOMY         MEDICATIONS:   Current Outpatient Medications:     albuterol sulfate HFA (PROVENTIL) 108 (90 Base) MCG/ACT inhaler, INHALE TWO PUFFS BY MOUTH EVERY 6 HOURS AS NEEDED FOR WHEEZING OR SHORTNESS OF BREATH, Disp: , Rfl:     allopurinol (ZYLOPRIM) 300 MG tablet, Take 1 tablet (300 mg) by mouth daily, Disp: , Rfl:     amLODIPine (NORVASC) 10 MG tablet, Take 1 tablet (10 mg) by mouth daily, Disp: , Rfl:     Ascorbic Acid (vitamin C) 1000 MG tablet, Take 1 tablet (1,000 mg) by mouth daily, Disp: , Rfl:     atorvastatin (LIPITOR) 40 MG tablet, Take 1 tablet (40 mg) by mouth  daily, Disp: , Rfl:     B Complex Vitamins (vitamin B complex) Tab, Take 1 tablet by mouth daily, Disp: , Rfl:     cloNIDine (CATAPRES) 0.2 MG tablet, Take 1 tablet (0.2 mg) by mouth 2 (two) times daily, Disp: , Rfl:     furosemide (LASIX) 20 MG tablet, TAKE 1 TABLET (20 MG) BY MOUTH DAILY, Disp: 90 tablet, Rfl: 0    glipiZIDE (GLUCOTROL) 10 MG 24 hr tablet, Take 1 tablet (10 mg) by mouth daily, Disp: 90 tablet, Rfl: 3    Glucosamine-Chondroit-Vit C-Mn (GLUCOSAMINE 1500 COMPLEX PO), Take 2 tablets by mouth daily, Disp: , Rfl:     glucose blood (OneTouch Ultra) test strip, 1 each by Other route daily Use as instructed, Disp: 100 strip, Rfl: 5    hydrALAZINE (APRESOLINE) 25 MG tablet, Take 1 tablet (25 mg) by mouth as needed Takes as needed, Disp: , Rfl:     isosorbide mononitrate (IMDUR) 30 MG 24 hr tablet, Take  1 tablet (30 mg) by mouth daily, Disp: , Rfl:     Lancets (OneTouch Delica Plus Lancet33G) Misc, Inject 1 Unit into the skin daily USE DAILY AS DIRECTED, Disp: 100 each, Rfl: 5    lisinopril (ZESTRIL) 40 MG tablet, Take 1 tablet (40 mg) by mouth daily, Disp: , Rfl:     metFORMIN (GLUCOPHAGE-XR) 500 MG 24 hr tablet, Take 2 tablets (1,000 mg) by mouth 2 (two) times daily, Disp: 360 tablet, Rfl: 0    metoprolol succinate (TOPROL-XL) 100 MG 24 hr tablet, TAKE ONE TABLET BY MOUTH DAILY WITH OR IMMEDIATELY FOLLOWING A MEAL; **MUST CALL MD FOR APPOINTMENT FOR FURTHER REFILLS, Disp: , Rfl:     Multiple Vitamin (Multi-Vitamin) tablet, Take 1 tablet by mouth daily, Disp: , Rfl:     nitroglycerin (NITROSTAT) 0.4 MG SL tablet, Place 1 tablet (0.4 mg) under the tongue every 5 (five) minutes as needed for Chest pain, Disp: 30 tablet, Rfl: 5    Omega-3 Fatty Acids (fish oil) 1000 MG Cap capsule, Take 1 capsule (1,000 mg) by mouth 2 (two) times daily, Disp: , Rfl:     Vitamin D, Cholecalciferol, 50 MCG (2000 UT) Cap, Take 2,000 Units by mouth daily, Disp: , Rfl:     warfarin (COUMADIN) 5 MG tablet, Take 1.5 tablets (7.5 mg)  by mouth every evening, Disp: 90 tablet, Rfl: 2    acetaminophen (TYLENOL) 500 MG tablet, Take 1 tablet (500 mg) by mouth every 6 (six) hours as needed (Patient not taking: Reported on 10/03/2022), Disp: , Rfl:     ALLERGIES:   Allergies   Allergen Reactions    Cephalexin Rash        FAMILY HISTORY: His family history includes Cancer in his brother.  Mother had heart disease  SOCIAL HISTORY: He reports that he has never smoked. He does not have any smokeless tobacco history on file. He reports that he does not currently use alcohol. He reports that he does not currently use drugs.    REVIEW OF SYSTEMS: All other systems reviewed and were negative except as stated above.     PHYSICAL EXAMINATION  General Appearance:  A well-appearing male in no acute distress.    Vital Signs: BP 125/73 (BP Site: Left arm, Patient Position: Sitting, Cuff Size: Medium)   Pulse 70   Ht 1.753 m (5\' 9" )   Wt 96.6 kg (213 lb)   SpO2 94%   BMI 31.45 kg/m    HEENT: Sclera anicteric, conjunctiva without pallor, moist mucous membranes, normal dentition. No arcus.   Neck:  Supple without jugular venous distention. Thyroid nonpalpable. Normal carotid upstroke without bruits.   Chest: Clear to auscultation bilaterally with good air movement and respiratory effort and no wheezes, rales, or rhonchi   Cardiovascular: Normal S1 and physiologically split S2 without murmurs, gallops or rub. PMI of normal size and nondisplaced.   Abdomen: Soft, nontender, nondistended, with normoactive bowel sounds. No organomegaly.  No pulsatile masses, or bruits.   Extremities: 1+ edema L>R. clubbing, or cyanosis. All peripheral pulses are full and equal.   Skin: No rash, xanthoma or xanthelasma.   Neuro: Alert and oriented x3. Grossly intact. Strength is symmetrical. Normal mood and affect.     ECG:        Narrative:      ATRIAL FIBRILLATION  ABNORMAL ECG  WHEN COMPARED WITH ECG OF 01-Sep-2022 09:13,  ATRIAL FIBRILLATION HAS REPLACED SINUS RHYTHM  Confirmed by  [facility] MD, Raelie Lohr ([id number]) on 12/23/2022 8:45:50  AM               LABS:   Lab Results   Component Value Date    CHOL 144 10/26/2022    LDL 75 10/26/2022    HDL 45 10/26/2022    TRIG 098 10/26/2022    NA 140 10/26/2022    K 4.7 10/26/2022    CL 105 10/26/2022    CO2 30 (H) 10/26/2022    BUN 14.0 10/26/2022    CREAT 1.0 10/26/2022     Summary  09/2022    1. There is a small sized area of decreased activity in the mid  inferolateral wall which is mild in intensity and reversible on rest images.  This is consistent with a small area of ischemia in the territory of the LCx  or RCA.    2. Gated wall motion study demonstrates normal left ventricular function  with calculated ejection fraction >65% at rest and post stress.    3. Frequent unifocal PVCs.    4. No prior studies are available for comparison.    Summary  09/2022    * The left ventricle is normal in size.    * There is normal left ventricular geometry.    * Left ventricular systolic function is normal with an ejection fraction by  Biplane Method of Discs of  67 %.    * Left ventricular segmental wall motion is normal.    * Left ventricular diastolic filling parameters demonstrate normal diastolic  function.    * The right ventricular cavity size is normal in size.    * Normal right ventricular systolic function.    * The left atrium is mildly dilated.    * There is mild aortic valve sclerosis/fibrocalcific changes vs mild aortic  stenosis.    * No pulmonary hypertension with estimated right ventricular systolic  pressure of  21 mmHg.    * There is moderate mitral annular calcification.    * No prior study is available for comparison.      08/2022  Predominant rhythm is normal sinus rhythm.   * 18.87 % of Atrial fibrillation/Atrial flutter with longest episode of 1d 7h.     * 9 supraventricular episodes were found. Longest SVT Episode 5 beats     Overall PVC Burden at 0.13 %  Overall PSVC Burden at 2.04 %     There is a total of 0 patient  events.  IMPRESSION/RECOMMENDATIONS: Mr. Tousley is a 82 y.o. male     Encounter Diagnoses   Name Primary?    SOB (shortness of breath) Yes    PAF (paroxysmal atrial fibrillation)     Arteriosclerosis of coronary artery     Bilateral leg edema     Routine general medical examination at health care facility      1. Coronary disease status post CABG in 2005. Vein graft to the OM is known to be occluded. He is asymptomatic on medical therapy. Last Myoview in 2018 without ischemia and normal EF.     2. Atrial fibrillation/flutter- paroxysmal. He is on chronic Coumadin therapy. Rate is well controlled when in Afib. He is asymptomatic.     3. Hyperlipidemia. On statin therapy and fish oil. Excellent control of LDL    4. HTN. BP is well controlled     5. 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 Last A1c 6.8%.     6. Edema. Chronic.     8. OSA- followed by pulmonary - has not required  CPAP yet.    9. 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 Will monitor.      Carl [name] presents for cardiology follow up for known cardiac disease including CAD/CABG, PAF, obesity, OSA.  For the last month has increasing sob with exertion. Not clearly volume overloaded. No chest pain like in past. Has gained 20 lbs since last fall which may be a factor. Getting more alerts on apple watch for PAF. Suspect venous insufficiency of legs. EKG AF CVR unchanged. BP has been above goal, but not relaxing for 5 min prior. 09/2022 nuke stress possible small area of inferolateral ischemia vs soft tissue attenuation.    Plan:  CT coronary angiogram  Blood work  CXR  Ultrasound of legs for venous insufficiency  Nurse practitioner follow up 6 weeks      Try to lose 10 lbs in the next 6 months  1500-1750 calories a day to lose weight  Low salt diet  Reduce saturated fats  Reduce carbohydrates            Check blood pressure once a day for 7 to 10 days, after relaxing for 5 minutes and than 2-3 minutes later repeat again.       When you check your blood pressure once a day, change the time of day from previous days so we can understand if you blood pressure fluctuates during the day.    Your ideal goal blood pressure is less than 130/80 mm Hg as long as you are not dizzy or tired at this blood pressure.    Call if your average blood pressure is higher than 130/80 to adjust your medications    You can purchase an Omron blood pressure cuff for your upper arm if you do not have one.    Low salt diet less than 2000 mg per day.    Avoid alcohol which raises blood pressure    Weight loss reduces blood pressure

## 2022-12-23 NOTE — Progress Notes (Signed)
December 23, 2022    INR:    Lab Results   Component Value Date    INR 2.7 (H) 12/23/2022     via lab draw    INR Goal:  2-3    Warfarin Indication: Paroxysmal AFib/AFlutter     CHA2DS2-VASc Score:  5 HTN (1), Age >/=75 (2), DM (1), and Vascular Dx (1)  HASBLED Score:  1 Elderly (Age>65)    Referring Provider: Dr. Demetrius Revel     Referral Date:  09/06/22     Last visit with referring provider:  09/01/22     PMH:    Past Medical History:   Diagnosis Date    Diabetes mellitus     Hyperlipidemia     Hypertension      Current warfarin dose:    Instructed Dose Reported Dose   Warfarin 5 mg Tues/Thurs/Sun and 7.5 mg all other days.   No deviation from instructed dose.  Denies missed or extras dose(s).   7-day warfarin total:  45 mg 7-day warfarin total:  45 mg     Warfarin Tab Strengths on hand:  5mg  (Rx 12/01/22 #120, RF 0)    Lab Results   Component Value Date    INR 2.7 (H) 12/23/2022    INR 2.5 (H) 11/30/2022    INR 2.1 (H) 11/17/2022    INR 2.2 (H) 11/02/2022    INR 2.6 (H) 10/20/2022    INR 2.6 (H) 10/13/2022     Dietary intake of vitamin K: Inconsistent. Typically 4-5 servings of green beans and salad (Spring mix, carrots, tomatoes, onions) per week. May have 1 serving of high vitamin K vegetables (collard greens) once monthly.  Patient is in assisted living facility and eats whatever is served at the facility. Reports no significant change in diet, still watching what he eats, avoiding collard greens.     Alcohol consumption:  No alcohol consumption since 1989.     Tobacco use: Former, quit in 1989     Medication changes:  Denies    New Medication/Dietary/Herbal Interactions: na    Concurrent significant interacting medications/supplements:    Medications that may enhance anticoagulant effect or bleeding risks:  APAP (patient takes prn <2000 mg/day)  Allopurinol  Glucosamine  Omega-3 fatty acids  Medications that may diminish anticoagulant effect:  Vitamin C  Metformin  MVI ([employer] brand, vitamin K 25 mcg).    S/sx  of bleeding/clotting:  Denies    Recent Falls:  Denies.   Patient uses cane to assist with mobility.     Upcoming procedures/surgeries:  Denies    Is patient a candidate for DOAC?  Yes    If yes, date and outcome of discussion: 10/06/22: Per pharmacy liaison, copay for Eliquis is $771.30/90 days (periodic deductible $545+coinsurance $226.30). Once deductible met, Eliquis would be ~$75/month. Patient has Medicare and not eligible for copay card.  Patient prefers to continue warfarin for now.    Others: na    ===========================    A/P:    INR 2.7 within therapeutic range.   Continue warfarin 5 mg Tues/Thurs/Sun and 7.5 mg all other days (45mg /wk).  Recheck INR in 4 week(s) on 01/19/23 via lab draw  Reminded pt the importance of consistent vit K diet, and to avoid high vitamin K vegetables such as collard green.  Warfarin has been managed per Clinic Protocol    Patient verbalized understanding of instructions provided.  Encounter conducted via the telephone.     Anson [place], PharmD, BCACP  Clinical  Pharmacy Specialist

## 2022-12-26 ENCOUNTER — Telehealth (INDEPENDENT_AMBULATORY_CARE_PROVIDER_SITE_OTHER): Payer: Self-pay

## 2022-12-26 DIAGNOSIS — Z79899 Other long term (current) drug therapy: Secondary | ICD-10-CM

## 2022-12-26 NOTE — Telephone Encounter (Signed)
-----   Message from Urban Gibson, MD sent at 12/25/2022  9:54 PM EDT -----  CXR shows CHF, sent you other message regarding lasix

## 2022-12-26 NOTE — Telephone Encounter (Signed)
-----   Message from Urban Gibson, MD sent at 12/25/2022  9:54 PM EDT -----  Blood work and CXR indicate mild CHF, confirm on lasix 20 mg daily and increase lasix from 20 mg to 40 mg daily and repeat bmp mg one week.

## 2022-12-27 NOTE — Telephone Encounter (Signed)
LMTCB

## 2022-12-30 ENCOUNTER — Telehealth (INDEPENDENT_AMBULATORY_CARE_PROVIDER_SITE_OTHER): Payer: Self-pay | Admitting: Cardiovascular Disease

## 2022-12-30 NOTE — Telephone Encounter (Signed)
Patient is returning Gillie Manners , RN call back.      Call back number is [phone]          Elton Sin  [facility]

## 2023-01-03 NOTE — Telephone Encounter (Signed)
Pt aware.

## 2023-01-09 ENCOUNTER — Other Ambulatory Visit (FREE_STANDING_LABORATORY_FACILITY): Payer: Medicare Other

## 2023-01-09 DIAGNOSIS — I4892 Unspecified atrial flutter: Secondary | ICD-10-CM

## 2023-01-09 DIAGNOSIS — Z79899 Other long term (current) drug therapy: Secondary | ICD-10-CM

## 2023-01-09 LAB — PT/INR
PT INR: 3 — ABNORMAL HIGH (ref 0.9–1.1)
PT: 35.5 s — ABNORMAL HIGH (ref 10.1–12.9)

## 2023-01-09 LAB — BASIC METABOLIC PANEL
Anion Gap: 7 (ref 5.0–15.0)
BUN: 12 mg/dL (ref 9.0–28.0)
CO2: 31 mEq/L — ABNORMAL HIGH (ref 17–29)
Calcium: 9.6 mg/dL (ref 7.9–10.2)
Chloride: 105 mEq/L (ref 99–111)
Creatinine: 1 mg/dL (ref 0.5–1.5)
Glucose: 125 mg/dL — ABNORMAL HIGH (ref 70–100)
Potassium: 4.8 mEq/L (ref 3.5–5.3)
Sodium: 143 mEq/L (ref 135–145)
eGFR: >60 mL/min/{1.73_m2} (ref >=60)

## 2023-01-09 LAB — MAGNESIUM: Magnesium: 1.8 mg/dL (ref 1.6–2.6)

## 2023-01-09 LAB — HEMOLYSIS INDEX(SOFT): Hemolysis Index: 11 Index (ref 0–24)

## 2023-01-10 ENCOUNTER — Ambulatory Visit (INDEPENDENT_AMBULATORY_CARE_PROVIDER_SITE_OTHER): Payer: Self-pay

## 2023-01-10 NOTE — Progress Notes (Signed)
Jan 10, 2023    INR:    Lab Results   Component Value Date    INR 3.0 (H) 01/09/2023     via lab draw    INR Goal:  2-3    Warfarin Indication: Paroxysmal AFib/AFlutter     CHA2DS2-VASc Score:  5 HTN (1), Age >/=75 (2), DM (1), and Vascular Dx (1)  HASBLED Score:  1 Elderly (Age>65)    Referring Provider: Dr. Demetrius Revel     Referral Date:  09/06/22     Last visit with referring provider:  09/01/22     PMH:    Past Medical History:   Diagnosis Date    Diabetes mellitus     Hyperlipidemia     Hypertension      Current warfarin dose:    Instructed Dose Reported Dose   Warfarin 5 mg Tues/Thurs/Sun and 7.5 mg all other days.   No deviation from instructed dose.  Denies missed or extras dose(s).   7-day warfarin total:  45 mg 7-day warfarin total:  45 mg     Warfarin Tab Strengths on hand:  5mg  (Rx 12/01/22 #120, RF 0)    Lab Results   Component Value Date    INR 3.0 (H) 01/09/2023    INR 2.7 (H) 12/23/2022    INR 2.5 (H) 11/30/2022    INR 2.1 (H) 11/17/2022    INR 2.2 (H) 11/02/2022    INR 2.6 (H) 10/20/2022     Dietary intake of vitamin K: Inconsistent. Typically 4-5 servings of green beans and salad (Spring mix, carrots, tomatoes, onions) per week. May have 1 serving of high vitamin K vegetables (collard greens) once monthly.  Patient is in assisted living facility and eats whatever is served at the facility. Reports no significant change in diet, still watching what he eats, avoiding collard greens.  Interested in drinking green tea.     Alcohol consumption:  No alcohol consumption since 1989.     Tobacco use: Former, quit in 1989     Medication changes:  Lasix dose increased from 20mg  daily to 40mg  daily x 1 week per Dr. Nile Riggs instruction per pt.    New Medication/Dietary/Herbal Interactions: na    Concurrent significant interacting medications/supplements:    Medications that may enhance anticoagulant effect or bleeding risks:  APAP (patient takes prn <2000 mg/day)  Allopurinol  Glucosamine  Omega-3 fatty  acids  Medications that may diminish anticoagulant effect:  Vitamin C  Metformin  MVI ([employer] brand, vitamin K 25 mcg).    S/sx of bleeding/clotting:  Denies    Recent Falls:  Denies.   Patient uses cane to assist with mobility.     Upcoming procedures/surgeries:  Denies    Is patient a candidate for DOAC?  Yes    If yes, date and outcome of discussion: 10/06/22: Per pharmacy liaison, copay for Eliquis is $771.30/90 days (periodic deductible $545+coinsurance $226.30). Once deductible met, Eliquis would be ~$75/month. Patient has Medicare and not eligible for copay card.  Patient prefers to continue warfarin for now.    Others: na    ===========================    A/P:    INR 3.0 within therapeutic range.   Continue warfarin 5 mg Tues/Thurs/Sun and 7.5 mg all other days (45mg /wk).  INR on the upward trend, will recheck INR in 2 week(s) on 01/23/23 via lab draw  Reminded pt the importance of consistent vit K diet, and to avoid high vitamin K vegetables such as collard green.  Ok to have  one cup of green tea 2-3 times per week to start  Advised pt to monitor weight daily and reach out to provider if weight gain > 2 lbs in a day or >5 lbs within the course of the week or increasing SOB or fatigue.   Warfarin has been managed per Clinic Protocol    Patient verbalized understanding of instructions provided.  Encounter conducted via the telephone.     Burnell Blanks, PharmD, BCPS  Clinical Pharmacy Specialist-Anticoagulation

## 2023-01-11 ENCOUNTER — Ambulatory Visit (INDEPENDENT_AMBULATORY_CARE_PROVIDER_SITE_OTHER): Payer: Medicare Other

## 2023-01-11 DIAGNOSIS — E785 Hyperlipidemia, unspecified: Secondary | ICD-10-CM

## 2023-01-11 DIAGNOSIS — I10 Essential (primary) hypertension: Secondary | ICD-10-CM

## 2023-01-12 NOTE — Telephone Encounter (Signed)
See other encounter.

## 2023-01-18 ENCOUNTER — Ambulatory Visit (INDEPENDENT_AMBULATORY_CARE_PROVIDER_SITE_OTHER): Payer: Medicare Other | Admitting: Family Medicine

## 2023-01-18 ENCOUNTER — Encounter (INDEPENDENT_AMBULATORY_CARE_PROVIDER_SITE_OTHER): Payer: Self-pay | Admitting: Family Medicine

## 2023-01-18 ENCOUNTER — Ambulatory Visit (INDEPENDENT_AMBULATORY_CARE_PROVIDER_SITE_OTHER): Payer: Self-pay

## 2023-01-18 ENCOUNTER — Other Ambulatory Visit (FREE_STANDING_LABORATORY_FACILITY): Payer: Medicare Other

## 2023-01-18 VITALS — BP 138/79 | HR 64 | Temp 97.6°F | Resp 16 | Ht 69.0 in | Wt 210.0 lb

## 2023-01-18 DIAGNOSIS — I4892 Unspecified atrial flutter: Secondary | ICD-10-CM

## 2023-01-18 DIAGNOSIS — E785 Hyperlipidemia, unspecified: Secondary | ICD-10-CM

## 2023-01-18 DIAGNOSIS — I5022 Chronic systolic (congestive) heart failure: Secondary | ICD-10-CM

## 2023-01-18 DIAGNOSIS — Z8739 Personal history of other diseases of the musculoskeletal system and connective tissue: Secondary | ICD-10-CM

## 2023-01-18 DIAGNOSIS — I152 Hypertension secondary to endocrine disorders: Secondary | ICD-10-CM

## 2023-01-18 DIAGNOSIS — E1159 Type 2 diabetes mellitus with other circulatory complications: Secondary | ICD-10-CM

## 2023-01-18 DIAGNOSIS — I5032 Chronic diastolic (congestive) heart failure: Secondary | ICD-10-CM | POA: Insufficient documentation

## 2023-01-18 DIAGNOSIS — E1169 Type 2 diabetes mellitus with other specified complication: Secondary | ICD-10-CM

## 2023-01-18 DIAGNOSIS — I4811 Longstanding persistent atrial fibrillation: Secondary | ICD-10-CM

## 2023-01-18 LAB — PT/INR
PT INR: 2.5 — ABNORMAL HIGH (ref 0.9–1.1)
PT: 29.5 s — ABNORMAL HIGH (ref 10.1–12.9)

## 2023-01-18 LAB — HEMOGLOBIN A1C
Average Estimated Glucose: 148.5 mg/dL
Hemoglobin A1C: 6.8 % — ABNORMAL HIGH (ref 4.6–5.6)

## 2023-01-18 MED ORDER — METOPROLOL SUCCINATE ER 100 MG PO TB24
100.0000 mg | ORAL_TABLET | Freq: Every day | ORAL | 3 refills | Status: AC
Start: 2023-01-18 — End: ?

## 2023-01-18 MED ORDER — ALLOPURINOL 300 MG PO TABS
300.0000 mg | ORAL_TABLET | Freq: Every day | ORAL | 3 refills | Status: AC
Start: 2023-01-18 — End: ?

## 2023-01-18 NOTE — Addendum Note (Signed)
Addended byFlonnie Overman, Dustine Bertini Lauraine Rinne on: 01/18/2023 09:08 AM     Modules accepted: Orders

## 2023-01-18 NOTE — Progress Notes (Unsigned)
Jan 18, 2023    INR:    Lab Results   Component Value Date    INR 2.5 (H) 01/18/2023     via lab draw    INR Goal:  2-3    Warfarin Indication: Paroxysmal AFib/AFlutter     CHA2DS2-VASc Score:  5 HTN (1), Age >/=75 (2), DM (1), and Vascular Dx (1)  HASBLED Score:  1 Elderly (Age>65)    Referring Provider: Dr. Demetrius Revel     Referral Date:  09/06/22     Last visit with referring provider:  09/01/22     PMH:    Past Medical History:   Diagnosis Date    Diabetes mellitus     Hyperlipidemia     Hypertension      Current warfarin dose:    Instructed Dose Reported Dose   Warfarin 5 mg Tues/Thurs/Sun and 7.5 mg all other days.   No deviation from instructed dose.  Denies missed or extras dose(s).   7-day warfarin total:  45 mg 7-day warfarin total:  45 mg     Warfarin Tab Strengths on hand:  5mg  (Rx 12/01/22 #120, RF 0)    Lab Results   Component Value Date    INR 2.5 (H) 01/18/2023    INR 3.0 (H) 01/09/2023    INR 2.7 (H) 12/23/2022    INR 2.5 (H) 11/30/2022    INR 2.1 (H) 11/17/2022    INR 2.2 (H) 11/02/2022     Dietary intake of vitamin K: Inconsistent. Typically 4-5 servings of green beans and salad (Spring mix, carrots, tomatoes, onions) per week. May have 1 serving of high vitamin K vegetables (collard greens) once monthly.  Patient is in assisted living facility and eats whatever is served at the facility. Reports no significant change in diet, still watching what he eats, avoiding collard greens.  Interested in drinking green tea.     Alcohol consumption:  No alcohol consumption since 1989.     Tobacco use: Former, quit in 1989     Medication changes:  Lasix dose increased from 20mg  daily to 40mg  daily x 1 week per Dr. Nile Riggs instruction per pt.    New Medication/Dietary/Herbal Interactions: na    Concurrent significant interacting medications/supplements:    Medications that may enhance anticoagulant effect or bleeding risks:  APAP (patient takes prn <2000 mg/day)  Allopurinol  Glucosamine  Omega-3 fatty  acids  Medications that may diminish anticoagulant effect:  Vitamin C  Metformin  MVI ([employer] brand, vitamin K 25 mcg).    S/sx of bleeding/clotting:  Denies    Recent Falls:  Denies.   Patient uses cane to assist with mobility.     Upcoming procedures/surgeries:  Denies    Is patient a candidate for DOAC?  Yes    If yes, date and outcome of discussion: 10/06/22: Per pharmacy liaison, copay for Eliquis is $771.30/90 days (periodic deductible $545+coinsurance $226.30). Once deductible met, Eliquis would be ~$75/month. Patient has Medicare and not eligible for copay card.  Patient prefers to continue warfarin for now.    Others: na    ===========================    A/P:    INR 3.0 within therapeutic range.   Continue warfarin 5 mg Tues/Thurs/Sun and 7.5 mg all other days (45mg /wk).  INR on the upward trend, will recheck INR in 2 week(s) on 01/23/23 via lab draw  Reminded pt the importance of consistent vit K diet, and to avoid high vitamin K vegetables such as collard green.  Ok to have  one cup of green tea 2-3 times per week to start  Advised pt to monitor weight daily and reach out to provider if weight gain > 2 lbs in a day or >5 lbs within the course of the week or increasing SOB or fatigue.   Warfarin has been managed per Clinic Protocol    Patient verbalized understanding of instructions provided.  Encounter conducted via the telephone.     Burnell Blanks, PharmD, BCPS  Clinical Pharmacy Specialist-Anticoagulation

## 2023-01-18 NOTE — Patient Instructions (Signed)
Lab work today.   Check the A1C and INR.   Eat healthy and avoid sugar and sweets as much as possible.   Stay active as much as you can.  Avoid falling down.   Thank you, [name], MD

## 2023-01-18 NOTE — Progress Notes (Signed)
Subjective:      Date: 01/18/2023 9:05 AM   Patient ID: Carl [name] is a 82 y.o. male.    Chief Complaint:  Chief Complaint   Patient presents with    Diabetes     Fasting/ blood work    Hyperlipidemia       HPI:  Diabetes , and the patient is taking the medication daily without side effects..     Lab Results   Component Value Date    HGBA1C 7.4 (H) 10/26/2022    HGBA1C 6.5 (H) 07/20/2022     Lab Results   Component Value Date    GLU 125 (H) 01/09/2023    LDL 75 10/26/2022    CREAT 1.0 01/09/2023   Sugar in the 150s  Diet is okay.   Using a cane to ambulate  His son is with him.     A fib taking coumadin for it.   Needs INR check.     Problem List:  Patient Active Problem List   Diagnosis    Arteriosclerosis of coronary artery    Atrial flutter    Type 2 diabetes mellitus with other specified complication    Hyperlipidemia associated with type 2 diabetes mellitus    Hypertension associated with type 2 diabetes mellitus    Neuropathy due to type 2 diabetes mellitus    Obesity (BMI 30-39.9)    OSA (obstructive sleep apnea)    Subjective tinnitus of both ears    Melanoma in situ of face    Arthritis of both knees    Bilateral hip joint arthritis    Intrinsic eczema    Chronic cough       Current Medications:  Outpatient Medications Marked as Taking for the 01/18/23 encounter (Office Visit) with Nunzio Banet Mounaf, MD   Medication Sig Dispense Refill    acetaminophen (TYLENOL) 500 MG tablet Take 1 tablet (500 mg) by mouth every 6 (six) hours as needed      albuterol sulfate HFA (PROVENTIL) 108 (90 Base) MCG/ACT inhaler INHALE TWO PUFFS BY MOUTH EVERY 6 HOURS AS NEEDED FOR WHEEZING OR SHORTNESS OF BREATH      allopurinol (ZYLOPRIM) 300 MG tablet Take 1 tablet (300 mg) by mouth daily      amLODIPine (NORVASC) 10 MG tablet Take 1 tablet (10 mg) by mouth daily      Ascorbic Acid (vitamin C) 1000 MG tablet Take 1 tablet (1,000 mg) by mouth daily      atorvastatin (LIPITOR) 40 MG tablet Take 1 tablet (40 mg) by mouth  daily      B Complex Vitamins (vitamin B complex) Tab Take 1 tablet by mouth daily      cloNIDine (CATAPRES) 0.2 MG tablet Take 1 tablet (0.2 mg) by mouth 2 (two) times daily      furosemide (LASIX) 20 MG tablet TAKE 1 TABLET (20 MG) BY MOUTH DAILY 90 tablet 0    glipiZIDE (GLUCOTROL) 10 MG 24 hr tablet Take 1 tablet (10 mg) by mouth daily 90 tablet 3    Glucosamine-Chondroit-Vit C-Mn (GLUCOSAMINE 1500 COMPLEX PO) Take 2 tablets by mouth daily      glucose blood (OneTouch Ultra) test strip 1 each by Other route daily Use as instructed 100 strip 5    hydrALAZINE (APRESOLINE) 25 MG tablet Take 1 tablet (25 mg) by mouth as needed Takes as needed      isosorbide mononitrate (IMDUR) 30 MG 24 hr tablet Take 1 tablet (30 mg) by mouth daily  Lancets (OneTouch Delica Plus Lancet33G) Misc Inject 1 Unit into the skin daily USE DAILY AS DIRECTED 100 each 5    lisinopril (ZESTRIL) 40 MG tablet Take 1 tablet (40 mg) by mouth daily      metFORMIN (GLUCOPHAGE-XR) 500 MG 24 hr tablet Take 2 tablets (1,000 mg) by mouth 2 (two) times daily 360 tablet 0    metoprolol succinate (TOPROL-XL) 100 MG 24 hr tablet TAKE ONE TABLET BY MOUTH DAILY WITH OR IMMEDIATELY FOLLOWING A MEAL; **MUST CALL MD FOR APPOINTMENT FOR FURTHER REFILLS      Multiple Vitamin (Multi-Vitamin) tablet Take 1 tablet by mouth daily      nitroglycerin (NITROSTAT) 0.4 MG SL tablet Place 1 tablet (0.4 mg) under the tongue every 5 (five) minutes as needed for Chest pain 30 tablet 5    Omega-3 Fatty Acids (fish oil) 1000 MG Cap capsule Take 1 capsule (1,000 mg) by mouth 2 (two) times daily      Vitamin D, Cholecalciferol, 50 MCG (2000 UT) Cap Take 2,000 Units by mouth daily      warfarin (COUMADIN) 5 MG tablet Take 1.5 tablets (7.5 mg) by mouth every evening 90 tablet 2       Allergies:  Allergies   Allergen Reactions    Cephalexin Rash       Past Medical History:  Past Medical History:   Diagnosis Date    Diabetes mellitus     Hyperlipidemia     Hypertension         Past Surgical History:  Past Surgical History:   Procedure Laterality Date    CARDIAC SURGERY      TONSILLECTOMY         Family History:  Family History   Problem Relation Age of Onset    Cancer Brother        Social History:  Social History     Tobacco Use    Smoking status: Never   Vaping Use    Vaping status: Never Used   Substance Use Topics    Alcohol use: Not Currently    Drug use: Not Currently        The following sections were reviewed this encounter by the provider:   Tobacco  Allergies  Meds  Problems  Med Hx  Surg Hx  Fam Hx         ROS:  Review of system was negative except for what was mentioned in HPI    Objective:     Vitals:  BP 138/79 (BP Site: Right arm, Patient Position: Sitting)   Pulse 64   Temp 97.6 F (36.4 C) (Oral)   Resp 16   Ht 1.753 m (5\' 9" )   Wt 95.3 kg (210 lb)   SpO2 96%   BMI 31.01 kg/m     Physical Exam:  Physical Exam  General Examination:  GENERAL APPEARANCE: alert, in no acute distress, well developed, well nourished, oriented to time, place, and person.   HEAD: normal appearance.   EYES: extraocular movement intact (EOMI), pupils equal, round, reactive to light and accommodation, sclera anicteric.   NECK/THYROID: neck supple,no lymphadenopathy, no thyromegaly.   SKIN: no rashes  HEART: S1, S2 normal, no murmurs, rubs, gallops, regular rate and rhythm.   LUNGS: normal effort / no distress, normal breath sounds, clear to auscultation bilaterally, no wheezes, rales, rhonchi.   ABDOMEN: bowel sounds present, no hepatosplenomegaly, soft, nontender, nondistended.   EXTREMITIES: no clubbing, cyanosis, or edema.   NEUROLOGIC: normal.  PSYCH: alert, oriented, cognitive function intact.    Assessment:       1. Type 2 diabetes mellitus with other specified complication, without long-term current use of insulin  - Hemoglobin A1C    2. Hyperlipidemia associated with type 2 diabetes mellitus    3. Hypertension associated with type 2 diabetes mellitus    4. Longstanding  persistent atrial fibrillation        Plan:   Please see patient instructions sheet for plan.       Follow-Up:   Return in about 3 months (around 04/20/2023) for Hyperlipidemia, Diabetes.    Patient Instructions   Lab work today.   Check the A1C and INR.   Eat healthy and avoid sugar and sweets as much as possible.   Stay active as much as you can.  Avoid falling down.   Thank you, [name], MD     Abyan Cadman [name], MD

## 2023-01-18 NOTE — Progress Notes (Signed)
Have you seen any specialists since your last visit with [place]?  Yes   Cardiologist    The patient was informed that the following HM items are still outstanding:   eye exam

## 2023-01-21 ENCOUNTER — Encounter (INDEPENDENT_AMBULATORY_CARE_PROVIDER_SITE_OTHER): Payer: Self-pay | Admitting: Cardiovascular Disease

## 2023-01-27 ENCOUNTER — Ambulatory Visit: Admission: RE | Admit: 2023-01-27 | Payer: Medicare Other | Source: Ambulatory Visit

## 2023-02-07 ENCOUNTER — Telehealth: Payer: Self-pay

## 2023-02-07 NOTE — Telephone Encounter (Signed)
Called pt, left voicemail to call back and confirm appt on 6/19. Thank you.

## 2023-02-07 NOTE — Telephone Encounter (Signed)
Son called and confirmed venous insuff scan on 02/09/23 and venous insuff consult on 02/22/23.

## 2023-02-08 ENCOUNTER — Encounter (INDEPENDENT_AMBULATORY_CARE_PROVIDER_SITE_OTHER): Payer: Self-pay | Admitting: Nurse Practitioner

## 2023-02-08 ENCOUNTER — Other Ambulatory Visit (FREE_STANDING_LABORATORY_FACILITY): Payer: Medicare Other

## 2023-02-08 ENCOUNTER — Other Ambulatory Visit (INDEPENDENT_AMBULATORY_CARE_PROVIDER_SITE_OTHER): Payer: Self-pay | Admitting: Family Medicine

## 2023-02-08 ENCOUNTER — Ambulatory Visit (INDEPENDENT_AMBULATORY_CARE_PROVIDER_SITE_OTHER): Payer: Self-pay

## 2023-02-08 ENCOUNTER — Ambulatory Visit (INDEPENDENT_AMBULATORY_CARE_PROVIDER_SITE_OTHER): Payer: Medicare Other | Admitting: Nurse Practitioner

## 2023-02-08 ENCOUNTER — Encounter (INDEPENDENT_AMBULATORY_CARE_PROVIDER_SITE_OTHER): Payer: Self-pay

## 2023-02-08 VITALS — BP 115/69 | HR 68 | Ht 69.0 in | Wt 214.0 lb

## 2023-02-08 DIAGNOSIS — R600 Localized edema: Secondary | ICD-10-CM

## 2023-02-08 DIAGNOSIS — I4892 Unspecified atrial flutter: Secondary | ICD-10-CM

## 2023-02-08 DIAGNOSIS — E1169 Type 2 diabetes mellitus with other specified complication: Secondary | ICD-10-CM

## 2023-02-08 DIAGNOSIS — I480 Paroxysmal atrial fibrillation: Secondary | ICD-10-CM

## 2023-02-08 DIAGNOSIS — I5022 Chronic systolic (congestive) heart failure: Secondary | ICD-10-CM

## 2023-02-08 LAB — BASIC METABOLIC PANEL
Anion Gap: 6 (ref 5.0–15.0)
BUN: 16 mg/dL (ref 9.0–28.0)
CO2: 31 mEq/L — ABNORMAL HIGH (ref 17–29)
Calcium: 10 mg/dL (ref 7.9–10.2)
Chloride: 105 mEq/L (ref 99–111)
Creatinine: 1.1 mg/dL (ref 0.5–1.5)
Glucose: 111 mg/dL — ABNORMAL HIGH (ref 70–100)
Potassium: 4.7 mEq/L (ref 3.5–5.3)
Sodium: 142 mEq/L (ref 135–145)
eGFR: >60 mL/min/{1.73_m2} (ref >=60)

## 2023-02-08 LAB — PT/INR
PT INR: 3.1 — ABNORMAL HIGH (ref 0.9–1.1)
PT: 36 s — ABNORMAL HIGH (ref 10.1–12.9)

## 2023-02-08 LAB — HEMOLYSIS INDEX: Hemolysis Index: 6 Index (ref 0–24)

## 2023-02-08 MED ORDER — FUROSEMIDE 20 MG PO TABS
40.0000 mg | ORAL_TABLET | Freq: Every day | ORAL | 3 refills | Status: DC
Start: 2023-02-08 — End: 2023-08-09

## 2023-02-08 NOTE — Progress Notes (Signed)
February 08, 2023    INR:    Lab Results   Component Value Date    INR 3.1 (H) 02/08/2023     via lab draw    INR Goal:  2-3    Warfarin Indication: Paroxysmal AFib/AFlutter     CHA2DS2-VASc Score:  5 HTN (1), Age >/=75 (2), DM (1), and Vascular Dx (1)  HASBLED Score:  1 Elderly (Age>65)    Referring Provider: Dr. Demetrius Revel     Referral Date:  09/06/22     Last visit with referring provider:  09/01/22     PMH:    Past Medical History:   Diagnosis Date    Diabetes mellitus     Hyperlipidemia     Hypertension      Current warfarin dose:    Instructed Dose Reported Dose   Warfarin 5 mg Tues/Thurs/Sun and 7.5 mg all other days.   No deviation from instructed dose.  Denies missed or extras dose(s).   7-day warfarin total:  45 mg 7-day warfarin total:  45 mg       A/P:    Unable to reach patient via telephone at 3:43 PM. INR 3.1 slightly above therapeutic range. INR has been trending upward over last several readings.   LVM with instructions to take 5 mg today (02/08/23) instead of 7.5 mg dose. Requested patient contact [facility] clinic to discuss INR results.   Recheck INR in 1 week.   Warfarin has been managed per Clinic Protocol    Patient verbalized understanding of instructions provided.  Encounter conducted via the telephone.         Magdalene Molly, PharmD, BCPS  Clinical Pharmacy Specialist

## 2023-02-08 NOTE — Patient Instructions (Addendum)
BPM today.   Continue Lasix 40 mg daily.

## 2023-02-08 NOTE — Progress Notes (Signed)
[place] [facility] [place]  OFFICE VISIT    I had the pleasure of seeing Carl [name] today for cardiovascular follow up. He is a pleasant 82 y.o. male with a history of paroxysmal atrial fibrillation, CABG in 2005, and heartburn who presents for follow-up.  Patient was seen by Dr. Nile Riggs on December 23, 2022 with complaints of dyspnea on exertion.  At that time patient was not volume overloaded.  Patient was asked to complete coronary CTA, complete blood work, chest x-ray, venous ultrasound, and follow-up with our office.    He did complete the chest x-ray and blood work.  Coronary CT angiogram and venous ultrasound are scheduled to be done next few days.    MEDICATIONS:   Current Outpatient Medications:     acetaminophen (TYLENOL) 500 MG tablet, Take 1 tablet (500 mg) by mouth every 6 (six) hours as needed, Disp: , Rfl:     albuterol sulfate HFA (PROVENTIL) 108 (90 Base) MCG/ACT inhaler, INHALE TWO PUFFS BY MOUTH EVERY 6 HOURS AS NEEDED FOR WHEEZING OR SHORTNESS OF BREATH, Disp: , Rfl:     allopurinol (ZYLOPRIM) 300 MG tablet, Take 1 tablet (300 mg) by mouth daily, Disp: 90 tablet, Rfl: 3    amLODIPine (NORVASC) 10 MG tablet, Take 1 tablet (10 mg) by mouth daily, Disp: , Rfl:     Ascorbic Acid (vitamin C) 1000 MG tablet, Take 1 tablet (1,000 mg) by mouth daily, Disp: , Rfl:     atorvastatin (LIPITOR) 40 MG tablet, Take 1 tablet (40 mg) by mouth daily, Disp: , Rfl:     B Complex Vitamins (vitamin B complex) Tab, Take 1 tablet by mouth daily, Disp: , Rfl:     cloNIDine (CATAPRES) 0.2 MG tablet, Take 1 tablet (0.2 mg) by mouth 2 (two) times daily, Disp: , Rfl:     furosemide (LASIX) 20 MG tablet, TAKE 1 TABLET (20 MG) BY MOUTH DAILY, Disp: 90 tablet, Rfl: 0    glipiZIDE (GLUCOTROL) 10 MG 24 hr tablet, Take 1 tablet (10 mg) by mouth daily, Disp: 90 tablet, Rfl: 3    Glucosamine-Chondroit-Vit C-Mn (GLUCOSAMINE 1500 COMPLEX PO), Take 2 tablets by mouth daily, Disp: , Rfl:     glucose blood (OneTouch Ultra) test strip, 1  each by Other route daily Use as instructed, Disp: 100 strip, Rfl: 5    hydrALAZINE (APRESOLINE) 25 MG tablet, Take 1 tablet (25 mg) by mouth as needed Takes as needed, Disp: , Rfl:     isosorbide mononitrate (IMDUR) 30 MG 24 hr tablet, Take 1 tablet (30 mg) by mouth daily, Disp: , Rfl:     Lancets (OneTouch Delica Plus Lancet33G) Misc, Inject 1 Unit into the skin daily USE DAILY AS DIRECTED, Disp: 100 each, Rfl: 5    lisinopril (ZESTRIL) 40 MG tablet, Take 1 tablet (40 mg) by mouth daily, Disp: , Rfl:     metFORMIN (GLUCOPHAGE-XR) 500 MG 24 hr tablet, Take 2 tablets (1,000 mg) by mouth 2 (two) times daily, Disp: 360 tablet, Rfl: 0    metoprolol succinate (TOPROL-XL) 100 MG 24 hr tablet, Take 1 tablet (100 mg) by mouth daily, Disp: 90 tablet, Rfl: 3    Multiple Vitamin (Multi-Vitamin) tablet, Take 1 tablet by mouth daily, Disp: , Rfl:     nitroglycerin (NITROSTAT) 0.4 MG SL tablet, Place 1 tablet (0.4 mg) under the tongue every 5 (five) minutes as needed for Chest pain, Disp: 30 tablet, Rfl: 5    Omega-3 Fatty Acids (fish oil) 1000 MG Cap capsule,  Take 1 capsule (1,000 mg) by mouth 2 (two) times daily, Disp: , Rfl:     Vitamin D, Cholecalciferol, 50 MCG (2000 UT) Cap, Take 2,000 Units by mouth daily, Disp: , Rfl:     warfarin (COUMADIN) 5 MG tablet, Take 1.5 tablets (7.5 mg) by mouth every evening, Disp: 90 tablet, Rfl: 2     REVIEW OF SYSTEMS: All other systems reviewed and negative except as above.    PHYSICAL EXAMINATION  General Appearance: A well-appearing male in no acute distress.   Vital Signs: BP 115/69 (BP Site: Left arm, Patient Position: Sitting, Cuff Size: Medium)   Pulse 68   Ht 1.753 m (5\' 9" )   Wt 97.1 kg (214 lb)   SpO2 94%   BMI 31.60 kg/m    HEENT: Sclera anicteric, conjunctiva without pallor, moist mucous membranes, normal dentition.   Neck: Supple without jugular venous distention. Thyroid nonpalpable. Normal carotid upstrokes without bruits.  Chest: Clear to auscultation bilaterally with  good air movement and respiratory effort and no wheezes, rales, or rhonchi  Cardiovascular: Normal S1 and physiologically split S2 without murmurs, gallops or rub. PMI of normal size and nondisplaced.   Abdomen: Soft, nontender. No organomegaly.  No pulsatile masses or bruits.    Extremities: Warm without edema. All peripheral pulses are full and equal.  Skin: No rash, xanthoma or xanthelasma.   Neuro: Alert and oriented x3. Grossly intact. Strength is symmetrical. Normal mood and affect.         LABS:   Lab Results   Component Value Date    WBC 10.69 (H) 12/23/2022    HGB 14.1 12/23/2022    HCT 41.4 12/23/2022    PLT 231 12/23/2022    NA 143 01/09/2023    K 4.8 01/09/2023    MG 1.8 01/09/2023    BUN 12.0 01/09/2023    CREAT 1.0 01/09/2023    GLU 125 (H) 01/09/2023    CHOL 144 10/26/2022    TRIG 121 10/26/2022    HDL 45 10/26/2022    LDL 75 10/26/2022    AST 26 10/26/2022    ALT 25 10/26/2022    HGBA1C 6.8 (H) 01/18/2023    TSH 1.17 07/20/2022      Echocardiogram 09/22/2022:     Summary    * The left ventricle is normal in size.    * There is normal left ventricular geometry.    * Left ventricular systolic function is normal with an ejection fraction by  Biplane Method of Discs of  67 %.    * Left ventricular segmental wall motion is normal.    * Left ventricular diastolic filling parameters demonstrate normal diastolic  function.    * The right ventricular cavity size is normal in size.    * Normal right ventricular systolic function.    * The left atrium is mildly dilated.    * There is mild aortic valve sclerosis/fibrocalcific changes vs mild aortic  stenosis.    * No pulmonary hypertension with estimated right ventricular systolic  pressure of  21 mmHg.    * There is moderate mitral annular calcification.    * No prior study is available for comparison.    IMPRESSION/RECOMMENDATIONS: Carl [name] is a 82 y.o. male who presents for follow up.     He was on lasix 20 mg daily which was increased after she was noted  to have elevated BNP and pulmonary vascular congestion and mild cardiomegaly.   He reports that he  is going to the bathroom often but the dyspnea on exertion has improved a great deal.    The coronary CT angiogram is scheduled to be done on June 10 and he is scheduled to complete venous ultrasound tomorrow.    Patient will complete BMP today to monitor his kidney function and electrolytes.  At this time no changes will be made to his current medications.  Patient will follow-up with our office in 4 weeks or sooner as needed.    Incident to service performed with physician present in the office in accordance with this patient's established plan of care.

## 2023-02-09 ENCOUNTER — Encounter (INDEPENDENT_AMBULATORY_CARE_PROVIDER_SITE_OTHER): Payer: Self-pay

## 2023-02-09 ENCOUNTER — Ambulatory Visit
Admission: RE | Admit: 2023-02-09 | Discharge: 2023-02-09 | Disposition: A | Discharge status: Home / Self Care | Payer: Medicare Other | Source: Ambulatory Visit | Attending: Cardiovascular Disease | Admitting: Cardiovascular Disease

## 2023-02-09 DIAGNOSIS — I480 Paroxysmal atrial fibrillation: Secondary | ICD-10-CM | POA: Insufficient documentation

## 2023-02-09 DIAGNOSIS — I2510 Atherosclerotic heart disease of native coronary artery without angina pectoris: Secondary | ICD-10-CM | POA: Insufficient documentation

## 2023-02-09 DIAGNOSIS — R0602 Shortness of breath: Secondary | ICD-10-CM | POA: Insufficient documentation

## 2023-02-09 DIAGNOSIS — R600 Localized edema: Secondary | ICD-10-CM | POA: Insufficient documentation

## 2023-02-09 DIAGNOSIS — Z0000 Encounter for general adult medical examination without abnormal findings: Secondary | ICD-10-CM | POA: Insufficient documentation

## 2023-02-09 MED ORDER — METFORMIN HCL ER 500 MG PO TB24
1000.0000 mg | ORAL_TABLET | Freq: Two times a day (BID) | ORAL | 0 refills | Status: DC
Start: 2023-02-09 — End: 2023-08-10

## 2023-02-09 NOTE — Progress Notes (Signed)
February 09, 2023    Attempted to reach patient via telephone to discuss INR results again today. Unable to reach patient. Phone disconnected while leaving message.     A/P:     INR 3.1 above therapeutic range.   LVM on 02/08/23 with instructions to take warfarin 5 mg instead of 7.5 mg and contact the [facility] clinic today.   Attempted to reach patient again today without success. MyChart message sent with instructions to continue previous maintenance dose and recheck INR in 1 week.       Magdalene Molly, PharmD, BCPS  Clinical Pharmacy Specialist

## 2023-02-13 ENCOUNTER — Ambulatory Visit
Admission: RE | Admit: 2023-02-13 | Discharge: 2023-02-13 | Disposition: A | Discharge status: Home / Self Care | Payer: Medicare Other | Source: Ambulatory Visit | Attending: Cardiovascular Disease | Admitting: Cardiovascular Disease

## 2023-02-13 DIAGNOSIS — R600 Localized edema: Secondary | ICD-10-CM | POA: Insufficient documentation

## 2023-02-13 DIAGNOSIS — I480 Paroxysmal atrial fibrillation: Secondary | ICD-10-CM

## 2023-02-13 DIAGNOSIS — R0602 Shortness of breath: Secondary | ICD-10-CM | POA: Insufficient documentation

## 2023-02-13 DIAGNOSIS — Z0000 Encounter for general adult medical examination without abnormal findings: Secondary | ICD-10-CM

## 2023-02-13 DIAGNOSIS — I2510 Atherosclerotic heart disease of native coronary artery without angina pectoris: Secondary | ICD-10-CM | POA: Insufficient documentation

## 2023-02-13 MED ORDER — IOHEXOL 350 MG/ML IV SOLN
100.0000 mL | Freq: Once | INTRAVENOUS | Status: AC | PRN
Start: 2023-02-13 — End: 2023-02-13
  Administered 2023-02-13: 100 mL via INTRAVENOUS

## 2023-02-13 MED ORDER — NITROGLYCERIN 0.4 MG SL SUBL
SUBLINGUAL_TABLET | SUBLINGUAL | Status: AC
Start: 2023-02-13 — End: 2023-02-13
  Administered 2023-02-13: 0.4 mg via SUBLINGUAL
  Filled 2023-02-13: qty 25

## 2023-02-13 MED ORDER — NITROGLYCERIN 0.4 MG SL SUBL
0.4000 mg | SUBLINGUAL_TABLET | Freq: Once | SUBLINGUAL | Status: AC | PRN
Start: 2023-02-13 — End: 2023-02-13

## 2023-02-13 MED ORDER — METOPROLOL TARTRATE 25 MG PO TABS
ORAL_TABLET | ORAL | Status: AC
Start: 2023-02-13 — End: 2023-02-13
  Administered 2023-02-13: 100 mg via ORAL
  Filled 2023-02-13: qty 4

## 2023-02-13 MED ORDER — METOPROLOL TARTRATE 25 MG PO TABS
100.0000 mg | ORAL_TABLET | Freq: Once | ORAL | Status: AC | PRN
Start: 2023-02-13 — End: 2023-02-13

## 2023-02-15 ENCOUNTER — Ambulatory Visit (INDEPENDENT_AMBULATORY_CARE_PROVIDER_SITE_OTHER): Payer: Medicare Other

## 2023-02-15 DIAGNOSIS — I10 Essential (primary) hypertension: Secondary | ICD-10-CM

## 2023-02-15 DIAGNOSIS — E785 Hyperlipidemia, unspecified: Secondary | ICD-10-CM

## 2023-02-22 ENCOUNTER — Ambulatory Visit: Payer: Medicare Other | Attending: Cardiovascular Disease | Admitting: Interventional Radiology and Diagnostic Radiology

## 2023-02-22 ENCOUNTER — Other Ambulatory Visit (INDEPENDENT_AMBULATORY_CARE_PROVIDER_SITE_OTHER): Payer: Self-pay

## 2023-02-22 VITALS — BP 126/62 | HR 67 | Ht 69.0 in | Wt 210.0 lb

## 2023-02-22 DIAGNOSIS — R600 Localized edema: Secondary | ICD-10-CM

## 2023-02-22 NOTE — Progress Notes (Signed)
Left VMM for Mr. Mender regarding his past due INR check.    [name], CPhT III  [facility]

## 2023-02-22 NOTE — Progress Notes (Signed)
Interventional Radiology  Consultation Note    Referring Clinician: Demetrius Revel MD  PCP: Flonnie Overman MD    HPI:   Carl [name] presents today with his son for evaluation of his bilateral lower extremity symptoms.    He is a very pleasant 82yo M with a PMH of previous 30 pack year smoker (quit 1989), CAD s/p CABG in 2005, DM2, HTN, HLD, skin cancer, and cataracts reports primary complaints of edema.  Patient states that he has had a greater than 1 year history of bilateral lower extremity edema.  He states edema equally affects bilateral lower extremities.  He has worn compression stockings daily for the past 1 year with minimal improvement. He reports edema is improved with leg elevation.  He has also been trialed on Lasix, which dose has been increased to 40mg  from 20mg  recently. He admits to LE cramping at night. He eats some mustard to relieve the cramping with minimal improvement. He denies claudication. There is no history of celluitis, DVT, and ulcer.  He denies aching, burning, fatigue, dermatitis, and episodes of superficial thrombophlebitis.     Body mass index is 31.01 kg/m.      Past Medical History:     Past Medical History:   Diagnosis Date    Diabetes mellitus     Hyperlipidemia     Hypertension        Past Surgical History:     Past Surgical History:   Procedure Laterality Date    CARDIAC SURGERY      TONSILLECTOMY         Allergies:     Allergies   Allergen Reactions    Cephalexin Rash       Medications:     Current Outpatient Medications   Medication Sig Dispense Refill    acetaminophen (TYLENOL) 500 MG tablet Take 1 tablet (500 mg) by mouth every 6 (six) hours as needed      albuterol sulfate HFA (PROVENTIL) 108 (90 Base) MCG/ACT inhaler INHALE TWO PUFFS BY MOUTH EVERY 6 HOURS AS NEEDED FOR WHEEZING OR SHORTNESS OF BREATH      allopurinol (ZYLOPRIM) 300 MG tablet Take 1 tablet (300 mg) by mouth daily 90 tablet 3    amLODIPine (NORVASC) 10 MG tablet Take 1 tablet (10 mg) by mouth daily       Ascorbic Acid (vitamin C) 1000 MG tablet Take 1 tablet (1,000 mg) by mouth daily      atorvastatin (LIPITOR) 40 MG tablet Take 1 tablet (40 mg) by mouth daily      B Complex Vitamins (vitamin B complex) Tab Take 1 tablet by mouth daily      cloNIDine (CATAPRES) 0.2 MG tablet Take 1 tablet (0.2 mg) by mouth 2 (two) times daily      furosemide (LASIX) 20 MG tablet Take 2 tablets (40 mg) by mouth daily 180 tablet 3    glipiZIDE (GLUCOTROL) 10 MG 24 hr tablet Take 1 tablet (10 mg) by mouth daily 90 tablet 3    Glucosamine-Chondroit-Vit C-Mn (GLUCOSAMINE 1500 COMPLEX PO) Take 2 tablets by mouth daily      glucose blood (OneTouch Ultra) test strip 1 each by Other route daily Use as instructed 100 strip 5    hydrALAZINE (APRESOLINE) 25 MG tablet Take 1 tablet (25 mg) by mouth as needed Takes as needed      isosorbide mononitrate (IMDUR) 30 MG 24 hr tablet Take 1 tablet (30 mg) by mouth daily      Lancets (OneTouch  Delica Plus Lancet33G) Misc Inject 1 Unit into the skin daily USE DAILY AS DIRECTED 100 each 5    lisinopril (ZESTRIL) 40 MG tablet Take 1 tablet (40 mg) by mouth daily      metFORMIN (GLUCOPHAGE-XR) 500 MG 24 hr tablet Take 2 tablets (1,000 mg) by mouth 2 (two) times daily 360 tablet 0    metoprolol succinate (TOPROL-XL) 100 MG 24 hr tablet Take 1 tablet (100 mg) by mouth daily 90 tablet 3    Multiple Vitamin (Multi-Vitamin) tablet Take 1 tablet by mouth daily      nitroglycerin (NITROSTAT) 0.4 MG SL tablet Place 1 tablet (0.4 mg) under the tongue every 5 (five) minutes as needed for Chest pain 30 tablet 5    Omega-3 Fatty Acids (fish oil) 1000 MG Cap capsule Take 1 capsule (1,000 mg) by mouth 2 (two) times daily      Vitamin D, Cholecalciferol, 50 MCG (2000 UT) Cap Take 2,000 Units by mouth daily      warfarin (COUMADIN) 5 MG tablet Take 1.5 tablets (7.5 mg) by mouth every evening 90 tablet 2     No current facility-administered medications for this visit.       Medication list, dosage, frequency and  administration were reviewed.    Family History:     Family History   Problem Relation Age of Onset    Cancer Brother        Social History:     Social History     Socioeconomic History    Marital status: Widowed   Tobacco Use    Smoking status: Former     Types: Cigarettes    Tobacco comments:     Quit smoking June 1989, smoked 30 years before   Vaping Use    Vaping status: Never Used   Substance and Sexual Activity    Alcohol use: Not Currently    Drug use: Not Currently    Sexual activity: Not Currently     Social Determinants of Health     Financial Resource Strain: Low Risk  (03/11/2022)    Received from [facility]    Overall Financial Resource Strain (CARDIA)     Difficulty of Paying Living Expenses: Not hard at all   Food Insecurity: No Food Insecurity (03/11/2022)    Received from [facility]    Hunger Vital Sign     Worried About Running Out of Food in the Last Year: Never true     Ran Out of Food in the Last Year: Never true   Transportation Needs: No Transportation Needs (02/18/2021)    Received from [facility] - Transportation     Lack of Transportation (Medical): No     Lack of Transportation (Non-Medical): No   Physical Activity: Insufficiently Active (02/08/2023)    Exercise Vital Sign     Days of Exercise per Week: 3 days     Minutes of Exercise per Session: 30 min   Stress: No Stress Concern Present (03/11/2022)    Received from [facility] - Occupational Stress Questionnaire     Feeling of Stress : Not at all   Social Connections: Socially Integrated (03/11/2022)    Received from [facility]    Social Network     How would you rate your social network (family, work, friends)?: Good participation with social networks   Intimate Partner Violence: Not At Risk (03/11/2022)    Received from [facility]    HITS     Over the last 12 months how often did your partner physically hurt you?: 1     Over the last 12 months how often did your partner insult  you or talk down to you?: 1     Over the last 12 months how often did your partner threaten you with physical harm?: 1     Over the last 12 months how often did your partner scream or curse at you?: 1   Housing Stability: Low Risk  (02/18/2021)    Received from [facility] Stability Vital Sign     Unable to Pay for Housing in the Last Year: No     Number of Places Lived in the Last Year: 1     Unstable Housing in the Last Year: No       Review of Systems:   A comprehensive review of systems is negative, except for:    [x] General: weight: []  Fatigue    [] Weight Loss     [] Fever     []  Chills    []  Rigors    [x] Respiratory: [] Shortness of breath     []  Cough     [] Wheeze   []  Hemoptysis    [x] Cardiac: []  Chest pain     []  Palpitations     []  PND     [x] GI: []  Reflux      []  Dyspepsia      []  abdominal pain   []  nausea    [] vomiting                                           []  diarrhea      []  constipation       []  blood in stool    [x] Genito-urinary: []  Burning with urination     [x]  Frequency     []  Urgency    []  Hematuria                                  [x] Musculoskeletal: []  joint pain       []  Arthritis    [x] CNS: []  focal weakness     []  headache     []  Seizures      []  double vision     []  fainting    [x] Endocrine:   [] No polyuria        []  No polydipsia          [] General weakness    [x] Skin: []  Rash         []  Pruritis         []   Ecchymosis        []  Stasis Ulcer    Physical Exam:     Vitals:    02/22/23 1130   BP: 126/62   Pulse: 67   SpO2: 97%     General: NAD, cooperative  HEENT: atraumatic, normocephalic, trachea midline  Lungs: normal respiratory effort  Right lower extremity: 2+ edema extending from ankle to proximal leg. No bulging veins , hyperpigmentation, ulcer, or tenderness to palpation identified.  Left lower extremity: spider veins and 2+ edema extending from ankle to proximal leg. No bulging veins , ulcer, or tenderness to palpation identified.  Neuro: AAOx3, grossly normal  Venous  Duplex:  Narrative & Impression   HISTORY: Bilateral lower extremity swelling x1 year. History of partial  right GSV harvesting for CABG     TECHNIQUE/FINDINGS: The right and left lower extremity venous systems were  evaluated using high resolution gray scale imaging, color Doppler, and  spectral waveform analysis.  No deep or superficial thrombosis is  identified.  The deep venous systems were evaluated from the posterior  tibial veins through the common femoral veins.  Evaluation for reflux was  performed in the standing position.     Examination of the deep systems is normal without deep system reflux.     FINDINGS:     Right lower extremity--  the right common femoral vein, femoral vein,  popliteal vein are all widely patent and compressible with no evidence of  DVT. Pulsatile flow in the deep system venous reflux on the right.     Measurements-- right side:     SFJ  6.0 mm  Proximal thigh GSV 2.0 mm  mid thigh GSV not visualized   distal thigh GSV not visualized    proximal calf GSV 4.0 mm  mid calf GSV 2.9 mm  distal calf GSV 3.2 mm  proximal calf SSV 2.7 mm  mid calf SSV 2.2 mm  distal calf SSV 3.1 mm     VENOUS REFLUX-- right side:     Diagnostic reflux maneuvers were performed in the following listed  segments. Reflux was elicited (secs)/ not elicited:     SFJ not elicited  Proximal thigh not elicited  mid thigh GSV not visualized  distal thigh GSV not visualized  proximal calf GSV not elicited  mid calf GSV not elicited  distal calf GSV not elicited  proximal calf SSV not elicited  mid calf SSV not elicited  distal calf SSV not elicited     right sided accessory saphenous vein(s): None identified  right sided thigh extension vein from the SSV: None identified     Left lower extremity--  the left common femoral vein, femoral vein,  popliteal vein are all widely patent and compressible with no evidence of  DVT. Pulsatile flow in the deep system venous reflux on the left.     Measurements--left side:      SFJ  6.2 mm  Proximal thigh GSV 3.9 mm  mid thigh GSV 3.8 mm  distal thigh GSV 3.8 mm  proximal calf GSV 4.0 mm  mid calf GSV 3.1 mm  distal calf GSV 3.7 mm  proximal calf SSV 3.8 mm  mid calf SSV 2.4 mm  distal calf SSV 2.5 mm     VENOUS REFLUX--left side:     Diagnostic reflux maneuvers were performed in the following listed  segments. Reflux was elicited (secs)/ not elicited:     SFJ not elicited  Proximal thigh not elicited  mid thigh GSV not elicited  distal thigh GSV not elicited  proximal calf GSV not elicited  mid calf GSV not elicited  distal calf GSV not elicited  proximal calf SSV not elicited  mid calf SSV not elicited  distal calf SSV not elicited     Left sided accessory saphenous vein(s): None identified  Left sided thigh extension vein from the SSV: None identified     IMPRESSION:      1.  No superficial venous insufficiency within the bilateral lower  extremities. Expected postoperative changes from partial right GSV  harvesting for bypass grafting.  2.  Patent bilateral lower extremity deep venous system with pulsatile  flow.  Barnie Alderman, DO  02/10/2023 1:48 PM       Assessment:   82yo M with history of CABG and right partial GSV harvest in 2005 with >1 year history of progressive bilateral LE edema  No evidence of superficial venous insufficiency on venous duplex study or clinical exam  Pulsatile flow in deep venous system and mild tricuspid regurgitation on prior ECHO    Plan:   Reviewed results of imaging studies with Jenesis and his son today.  Recommend conservative management with leg elevation, daily -20-59mm Hg knee high compression stocking use, calf exercises, sodium restriction  Follow up with cardiologist and PCP  Follow up with CVIR PRN  All questions answered    More than half of this 40 minute visit was spent in discussion and coordination of care.    Signed by: Viann Shove, PA  CVIR Department   [facility]  [phone]      I have evaluated the patient  and agree with the above documented evaluation and plan.    Signed by: Suszanne Finch, MD    [facility]  [facility], Division of Vascular & Interventional Radiology  IA[facility]- [phone]  [facility] - [phone]  IFH--[id number] IL[id number] IF[facility]--[id number]  After Hours Service [phone]

## 2023-03-02 ENCOUNTER — Other Ambulatory Visit (INDEPENDENT_AMBULATORY_CARE_PROVIDER_SITE_OTHER): Payer: Self-pay

## 2023-03-02 NOTE — Progress Notes (Signed)
Spoke to Carl [name] regarding his past due INR check.  Pt states he has appt with lab tomorrow at noon.       [name], CPhT III  [facility]

## 2023-03-03 ENCOUNTER — Other Ambulatory Visit (FREE_STANDING_LABORATORY_FACILITY): Payer: Medicare Other

## 2023-03-03 DIAGNOSIS — I4892 Unspecified atrial flutter: Secondary | ICD-10-CM

## 2023-03-03 LAB — PT/INR
INR: 2.4 (ref 0.9–1.1)
PT: 27.9 s — ABNORMAL HIGH (ref 10.1–12.9)

## 2023-03-06 ENCOUNTER — Ambulatory Visit (INDEPENDENT_AMBULATORY_CARE_PROVIDER_SITE_OTHER): Payer: Self-pay

## 2023-03-06 NOTE — Progress Notes (Addendum)
March 06, 2023    INR:    Lab Results   Component Value Date    INR 2.4 03/03/2023     via lab draw    INR Goal:  2-3    Warfarin Indication: Paroxysmal AFib/AFlutter     CHA2DS2-VASc Score:  5 HTN (1), Age >/=75 (2), DM (1), and Vascular Dx (1)  HASBLED Score:  1 Elderly (Age>65)    Referring Provider: Dr. Demetrius Revel     Referral Date:  09/06/22     Last visit with referring provider:  09/01/22     PMH:    Past Medical History:   Diagnosis Date    Diabetes mellitus     Hyperlipidemia     Hypertension      Current warfarin dose:    Instructed Dose Reported Dose   Warfarin 5 mg Tues/Thurs/Sun and 7.5 mg all other days.   No deviation from instructed dose.  Denies missed or extras dose(s).   7-day warfarin total:  45 mg 7-day warfarin total:  45 mg   Dietary intake of vitamin K: Inconsistent. Typically 4-5 servings of green beans and salad (Spring mix, carrots, tomatoes, onions) per week. May have 1 serving of high vitamin K vegetables (collard greens) once monthly.  Patient is in assisted living facility and eats whatever is served at the facility. Reports no significant change in diet, still watching what he eats, avoiding collard greens.  Does not plan to start drinking green tea.      Alcohol consumption:  No alcohol consumption since 1989.      Tobacco use: Former, quit in 1989      Medication changes:  Denies      New Medication/Dietary/Herbal Interactions: na     Concurrent significant interacting medications/supplements:    Medications that may enhance anticoagulant effect or bleeding risks:  APAP (patient takes prn <2000 mg/day)  Allopurinol  Glucosamine  Omega-3 fatty acids  Medications that may diminish anticoagulant effect:  Vitamin C  Metformin  MVI ([employer] brand, vitamin K 25 mcg).     S/sx of bleeding/clotting:  Denies     Recent Falls:  Denies.   Patient uses cane to assist with mobility.      Upcoming procedures/surgeries:  Denies     Is patient a candidate for DOAC?  Yes    If yes, date and  outcome of discussion: 10/06/22: Per pharmacy liaison, copay for Eliquis is $771.30/90 days (periodic deductible $545+coinsurance $226.30). Once deductible met, Eliquis would be ~$75/month. Patient has Medicare and not eligible for copay card.  Patient prefers to continue warfarin for now.     Others: na     =======================================    A/P:    INR 2.4 within therapeutic range.   Continue warfarin Warfarin 5 mg Tues/Thurs/Sun and 7.5 mg all other days.  Recheck INR in 3-4 weeks on 7/24 via lab   Instructed pt to check INR in the morning M-Th.  INR received after 3pm will be addressed the following business day.  Instructed patient to call [facility] if no call received by 3PM the day after INR testing.   Warfarin has been managed per Clinic Protocol    Patient verbalized understanding of instructions provided.  Encounter conducted via the telephone.       Burnell Blanks, PharmD, BCPS  Clinical Pharmacy Specialist-Anticoagulation

## 2023-03-07 ENCOUNTER — Ambulatory Visit (INDEPENDENT_AMBULATORY_CARE_PROVIDER_SITE_OTHER): Payer: Medicare Other

## 2023-03-07 DIAGNOSIS — I10 Essential (primary) hypertension: Secondary | ICD-10-CM

## 2023-03-07 DIAGNOSIS — E785 Hyperlipidemia, unspecified: Secondary | ICD-10-CM

## 2023-03-12 ENCOUNTER — Encounter (INDEPENDENT_AMBULATORY_CARE_PROVIDER_SITE_OTHER): Payer: Self-pay | Admitting: Cardiovascular Disease

## 2023-04-03 ENCOUNTER — Other Ambulatory Visit (FREE_STANDING_LABORATORY_FACILITY): Payer: Medicare Other

## 2023-04-03 DIAGNOSIS — I4892 Unspecified atrial flutter: Secondary | ICD-10-CM

## 2023-04-03 LAB — PT/INR
INR: 2.7 (ref 0.9–1.1)
PT: 30.4 s — ABNORMAL HIGH (ref 10.1–12.9)

## 2023-04-04 ENCOUNTER — Ambulatory Visit (INDEPENDENT_AMBULATORY_CARE_PROVIDER_SITE_OTHER): Payer: Self-pay

## 2023-04-04 NOTE — Progress Notes (Signed)
April 04, 2023    INR:    Lab Results   Component Value Date    INR 2.7 04/03/2023     via lab draw    INR Goal:  2-3    Warfarin Indication: Paroxysmal AFib/AFlutter     CHA2DS2-VASc Score:  5 HTN (1), Age >/=75 (2), DM (1), and Vascular Dx (1)  HASBLED Score:  1 Elderly (Age>65)    Referring Provider: Dr. Demetrius Revel     Referral Date:  09/06/22     Last visit with referring provider:  09/01/22     PMH:    Past Medical History:   Diagnosis Date    Diabetes mellitus     Hyperlipidemia     Hypertension      Current warfarin dose:    Instructed Dose Reported Dose   Warfarin 5 mg Tues/Thurs/Sun and 7.5 mg all other days.   No deviation from instructed dose.  Denies missed or extras dose(s).   7-day warfarin total:  45 mg 7-day warfarin total:  45 mg     Dietary intake of vitamin K: Inconsistent. Typically 4-5 servings of green beans and salad (Spring mix, carrots, tomatoes, onions) per week. May have 1 serving of high vitamin K vegetables (collard greens) once monthly.  Patient is in assisted living facility and eats whatever is served at the facility. Reports no significant change in diet, still watching what he eats, avoiding collard greens.  Does not plan to start drinking green tea.      Alcohol consumption:  No alcohol consumption since 1989.      Tobacco use: Former, quit in 1989      Medication changes:  Denies      New Medication/Dietary/Herbal Interactions: na     Concurrent significant interacting medications/supplements:    Medications that may enhance anticoagulant effect or bleeding risks:  APAP (patient takes prn <2000 mg/day)  Allopurinol  Glucosamine  Omega-3 fatty acids  Medications that may diminish anticoagulant effect:  Vitamin C  Metformin  MVI ([employer] brand, vitamin K 25 mcg).     S/sx of bleeding/clotting:  Denies     Recent Falls:  Denies.   Patient uses cane to assist with mobility.      Upcoming procedures/surgeries:  Denies.  Had a procedure done on his jaw week ago from last Friday, had  found cancer.  Procedure went well, no concerns per pt.      Is patient a candidate for DOAC?  Yes    If yes, date and outcome of discussion: 10/06/22: Per pharmacy liaison, copay for Eliquis is $771.30/90 days (periodic deductible $545+coinsurance $226.30). Once deductible met, Eliquis would be ~$75/month. Patient has Medicare and not eligible for copay card.  Patient prefers to continue warfarin for now.     Others: na     =======================================    A/P:    INR 2.7 within therapeutic range.   Continue warfarin Warfarin 5 mg Tues/Thurs/Sun and 7.5 mg all other days.  Recheck INR in 3-4 weeks on 08/27 via lab   Instructed pt to check INR in the morning M-Th.  INR received after 3pm will be addressed the following business day.  Instructed patient to call [facility] if no call received by 3PM the day after INR testing.   Warfarin has been managed per Clinic Protocol    Patient verbalized understanding of instructions provided.  Encounter conducted via the telephone.     [name], CPhT III  [facility]  Cosigned by:    Burnell Blanks, PharmD, BCPS  Clinical Pharmacy Specialist-Anticoagulation

## 2023-04-07 ENCOUNTER — Encounter (INDEPENDENT_AMBULATORY_CARE_PROVIDER_SITE_OTHER): Payer: Self-pay | Admitting: Family Medicine

## 2023-04-07 ENCOUNTER — Ambulatory Visit (INDEPENDENT_AMBULATORY_CARE_PROVIDER_SITE_OTHER): Payer: Medicare Other | Admitting: Family Medicine

## 2023-04-07 VITALS — BP 117/69 | HR 73 | Temp 98.1°F | Resp 15 | Wt 211.2 lb

## 2023-04-07 DIAGNOSIS — E1169 Type 2 diabetes mellitus with other specified complication: Secondary | ICD-10-CM

## 2023-04-07 DIAGNOSIS — E1159 Type 2 diabetes mellitus with other circulatory complications: Secondary | ICD-10-CM

## 2023-04-07 DIAGNOSIS — I152 Hypertension secondary to endocrine disorders: Secondary | ICD-10-CM

## 2023-04-07 DIAGNOSIS — E669 Obesity, unspecified: Secondary | ICD-10-CM

## 2023-04-07 DIAGNOSIS — E785 Hyperlipidemia, unspecified: Secondary | ICD-10-CM

## 2023-04-07 DIAGNOSIS — I2510 Atherosclerotic heart disease of native coronary artery without angina pectoris: Secondary | ICD-10-CM

## 2023-04-07 DIAGNOSIS — I5022 Chronic systolic (congestive) heart failure: Secondary | ICD-10-CM

## 2023-04-07 LAB — POCT HEMOGLOBIN A1C: POCT Hgb A1C: 7.2 % — AB (ref 3.9–5.9)

## 2023-04-07 MED ORDER — SEMAGLUTIDE(0.25 OR 0.5MG/DOS) 2 MG/3ML SC SOPN
PEN_INJECTOR | SUBCUTANEOUS | 3 refills | Status: DC
Start: 2023-04-07 — End: 2023-08-05

## 2023-04-07 NOTE — Patient Instructions (Signed)
Lets try Ozempic for diabetes and weight loss  Eat healthy and avoid sugar and sweets as much as possible.   Avoid starchy food such as rice, pasta, bread, and potatos  Eat small healthy portions.   Thank you, [name], MD

## 2023-04-07 NOTE — Progress Notes (Signed)
Have you seen any specialists since your last visit with us?  No      The patient was informed that the following HM items are still outstanding:   eye exam

## 2023-04-07 NOTE — Progress Notes (Signed)
Subjective:      Date: 04/07/2023 12:00 PM   Patient ID: Carl [name] is a 82 y.o. male.    Chief Complaint:  Chief Complaint   Patient presents with    Diabetes       HPI:  Diabetes , and the patient is taking the medication daily without side effects.  Lab Results   Component Value Date    HGBA1C 7.2 (A) 04/07/2023    HGBA1C 6.8 (H) 01/18/2023    HGBA1C 7.4 (H) 10/26/2022     Lab Results   Component Value Date    GLU 111 (H) 02/08/2023    LDL 75 10/26/2022    CREAT 1.1 02/08/2023     Sugar in the am 150s or higher not sure why.   Used to be better around 130 mg/dl  Doesn't mind trying the shots for weight loss and diabetes.     Problem List:  Problem List[1]    Current Medications:  Medications Taking[2]  Current Medications[3]      Allergies:  Allergies[4]      Past Medical History:  Medical History[5]    Past Surgical History:  Past Surgical History:   Procedure Laterality Date    CARDIAC SURGERY      TONSILLECTOMY         Family History:  Family History   Problem Relation Age of Onset    Cancer Brother        Social History:  Social History[6]     The following sections were reviewed this encounter by the provider:        ROS:  Review of system was negative except for what was mentioned in HPI    Objective:     Vitals:  BP 117/69 (BP Site: Left arm, Patient Position: Sitting, Cuff Size: X-Large)   Pulse 73   Temp 98.1 F (36.7 C)   Resp 15   Wt 95.8 kg (211 lb 3.2 oz)   SpO2 93%   BMI 31.19 kg/m     Physical Exam:  Physical Exam  General Examination:  GENERAL APPEARANCE: alert, in no acute distress, well developed, well nourished, oriented to time, place, and person.   HEAD: normal appearance.   EYES: extraocular movement intact (EOMI), pupils equal, round, reactive to light and accommodation, sclera anicteric.   NECK/THYROID: neck supple,no lymphadenopathy, no thyromegaly.   SKIN: no rashes  HEART: S1, S2 normal, no murmurs, rubs, gallops, regular rate and rhythm.   LUNGS: normal effort / no  distress, normal breath sounds, clear to auscultation bilaterally, no wheezes, rales, rhonchi.   ABDOMEN: bowel sounds present, no hepatosplenomegaly, soft, nontender, nondistended.   EXTREMITIES: no clubbing, cyanosis, or edema.   NEUROLOGIC: normal.   PSYCH: alert, oriented, cognitive function intact.    Assessment:       1. Type 2 diabetes mellitus with other specified complication, without long-term current use of insulin  - semaglutide (OZEMPIC) 2 MG/3ML; Inject 0.25 mg into the skin once a week for 28 days, THEN 0.5 mg once a week.  Dispense: 3 mL; Refill: 3  - POCT Hemoglobin A1C    2. Hyperlipidemia associated with type 2 diabetes mellitus    3. Hypertension associated with type 2 diabetes mellitus    4. Chronic systolic congestive heart failure  - semaglutide (OZEMPIC) 2 MG/3ML; Inject 0.25 mg into the skin once a week for 28 days, THEN 0.5 mg once a week.  Dispense: 3 mL; Refill: 3    5. Obesity (BMI  30-39.9)  - semaglutide (OZEMPIC) 2 MG/3ML; Inject 0.25 mg into the skin once a week for 28 days, THEN 0.5 mg once a week.  Dispense: 3 mL; Refill: 3    6. Arteriosclerosis of coronary artery  - semaglutide (OZEMPIC) 2 MG/3ML; Inject 0.25 mg into the skin once a week for 28 days, THEN 0.5 mg once a week.  Dispense: 3 mL; Refill: 3        Plan:   Please see patient instructions sheet for plan.       Follow-Up:   Return in about 3 months (around 07/08/2023) for Diabetes, Hyperlipidemia, HTN.    Patient Instructions   Lets try Ozempic for diabetes and weight loss  Eat healthy and avoid sugar and sweets as much as possible.   Avoid starchy food such as rice, pasta, bread, and potatos  Eat small healthy portions.   Thank you, [name], MD     [name] [name], MD         [1]   Patient Active Problem List  Diagnosis    Arteriosclerosis of coronary artery    Atrial flutter    Type 2 diabetes mellitus with other specified complication    Hyperlipidemia associated with type 2 diabetes mellitus    Hypertension  associated with type 2 diabetes mellitus    Neuropathy due to type 2 diabetes mellitus    Obesity (BMI 30-39.9)    OSA (obstructive sleep apnea)    Subjective tinnitus of both ears    Melanoma in situ of face    Arthritis of both knees    Bilateral hip joint arthritis    Intrinsic eczema    Chronic cough    Chronic systolic congestive heart failure   [2]   Outpatient Medications Marked as Taking for the 04/07/23 encounter (Office Visit) with Sir Mallis Mounaf, MD   Medication Sig Dispense Refill    acetaminophen (TYLENOL) 500 MG tablet Take 1 tablet (500 mg) by mouth every 6 (six) hours as needed      albuterol sulfate HFA (PROVENTIL) 108 (90 Base) MCG/ACT inhaler INHALE TWO PUFFS BY MOUTH EVERY 6 HOURS AS NEEDED FOR WHEEZING OR SHORTNESS OF BREATH      allopurinol (ZYLOPRIM) 300 MG tablet Take 1 tablet (300 mg) by mouth daily 90 tablet 3    amLODIPine (NORVASC) 10 MG tablet Take 1 tablet (10 mg) by mouth daily      Ascorbic Acid (vitamin C) 1000 MG tablet Take 1 tablet (1,000 mg) by mouth daily      atorvastatin (LIPITOR) 40 MG tablet Take 1 tablet (40 mg) by mouth daily      B Complex Vitamins (vitamin B complex) Tab Take 1 tablet by mouth daily      cloNIDine (CATAPRES) 0.2 MG tablet Take 1 tablet (0.2 mg) by mouth 2 (two) times daily      furosemide (LASIX) 20 MG tablet Take 2 tablets (40 mg) by mouth daily 180 tablet 3    glipiZIDE (GLUCOTROL) 10 MG 24 hr tablet Take 1 tablet (10 mg) by mouth daily 90 tablet 3    Glucosamine-Chondroit-Vit C-Mn (GLUCOSAMINE 1500 COMPLEX PO) Take 2 tablets by mouth daily      glucose blood (OneTouch Ultra) test strip 1 each by Other route daily Use as instructed 100 strip 5    hydrALAZINE (APRESOLINE) 25 MG tablet Take 1 tablet (25 mg) by mouth as needed Takes as needed      isosorbide mononitrate (IMDUR) 30 MG 24  hr tablet Take 1 tablet (30 mg) by mouth daily      Lancets (OneTouch Delica Plus Lancet33G) Misc Inject 1 Unit into the skin daily USE DAILY AS DIRECTED 100 each 5     lisinopril (ZESTRIL) 40 MG tablet Take 1 tablet (40 mg) by mouth daily      metFORMIN (GLUCOPHAGE-XR) 500 MG 24 hr tablet Take 2 tablets (1,000 mg) by mouth 2 (two) times daily 360 tablet 0    metoprolol succinate (TOPROL-XL) 100 MG 24 hr tablet Take 1 tablet (100 mg) by mouth daily 90 tablet 3    Multiple Vitamin (Multi-Vitamin) tablet Take 1 tablet by mouth daily      nitroglycerin (NITROSTAT) 0.4 MG SL tablet Place 1 tablet (0.4 mg) under the tongue every 5 (five) minutes as needed for Chest pain 30 tablet 5    Omega-3 Fatty Acids (fish oil) 1000 MG Cap capsule Take 1 capsule (1,000 mg) by mouth 2 (two) times daily      Vitamin D, Cholecalciferol, 50 MCG (2000 UT) Cap Take 2,000 Units by mouth daily      warfarin (COUMADIN) 5 MG tablet Take 1.5 tablets (7.5 mg) by mouth every evening 90 tablet 2   [3]   Current Outpatient Medications:     acetaminophen (TYLENOL) 500 MG tablet, Take 1 tablet (500 mg) by mouth every 6 (six) hours as needed, Disp: , Rfl:     albuterol sulfate HFA (PROVENTIL) 108 (90 Base) MCG/ACT inhaler, INHALE TWO PUFFS BY MOUTH EVERY 6 HOURS AS NEEDED FOR WHEEZING OR SHORTNESS OF BREATH, Disp: , Rfl:     allopurinol (ZYLOPRIM) 300 MG tablet, Take 1 tablet (300 mg) by mouth daily, Disp: 90 tablet, Rfl: 3    amLODIPine (NORVASC) 10 MG tablet, Take 1 tablet (10 mg) by mouth daily, Disp: , Rfl:     Ascorbic Acid (vitamin C) 1000 MG tablet, Take 1 tablet (1,000 mg) by mouth daily, Disp: , Rfl:     atorvastatin (LIPITOR) 40 MG tablet, Take 1 tablet (40 mg) by mouth daily, Disp: , Rfl:     B Complex Vitamins (vitamin B complex) Tab, Take 1 tablet by mouth daily, Disp: , Rfl:     cloNIDine (CATAPRES) 0.2 MG tablet, Take 1 tablet (0.2 mg) by mouth 2 (two) times daily, Disp: , Rfl:     furosemide (LASIX) 20 MG tablet, Take 2 tablets (40 mg) by mouth daily, Disp: 180 tablet, Rfl: 3    glipiZIDE (GLUCOTROL) 10 MG 24 hr tablet, Take 1 tablet (10 mg) by mouth daily, Disp: 90 tablet, Rfl: 3     Glucosamine-Chondroit-Vit C-Mn (GLUCOSAMINE 1500 COMPLEX PO), Take 2 tablets by mouth daily, Disp: , Rfl:     glucose blood (OneTouch Ultra) test strip, 1 each by Other route daily Use as instructed, Disp: 100 strip, Rfl: 5    hydrALAZINE (APRESOLINE) 25 MG tablet, Take 1 tablet (25 mg) by mouth as needed Takes as needed, Disp: , Rfl:     isosorbide mononitrate (IMDUR) 30 MG 24 hr tablet, Take 1 tablet (30 mg) by mouth daily, Disp: , Rfl:     Lancets (OneTouch Delica Plus Lancet33G) Misc, Inject 1 Unit into the skin daily USE DAILY AS DIRECTED, Disp: 100 each, Rfl: 5    lisinopril (ZESTRIL) 40 MG tablet, Take 1 tablet (40 mg) by mouth daily, Disp: , Rfl:     metFORMIN (GLUCOPHAGE-XR) 500 MG 24 hr tablet, Take 2 tablets (1,000 mg) by mouth 2 (two) times daily,  Disp: 360 tablet, Rfl: 0    metoprolol succinate (TOPROL-XL) 100 MG 24 hr tablet, Take 1 tablet (100 mg) by mouth daily, Disp: 90 tablet, Rfl: 3    Multiple Vitamin (Multi-Vitamin) tablet, Take 1 tablet by mouth daily, Disp: , Rfl:     nitroglycerin (NITROSTAT) 0.4 MG SL tablet, Place 1 tablet (0.4 mg) under the tongue every 5 (five) minutes as needed for Chest pain, Disp: 30 tablet, Rfl: 5    Omega-3 Fatty Acids (fish oil) 1000 MG Cap capsule, Take 1 capsule (1,000 mg) by mouth 2 (two) times daily, Disp: , Rfl:     Vitamin D, Cholecalciferol, 50 MCG (2000 UT) Cap, Take 2,000 Units by mouth daily, Disp: , Rfl:     warfarin (COUMADIN) 5 MG tablet, Take 1.5 tablets (7.5 mg) by mouth every evening, Disp: 90 tablet, Rfl: 2    semaglutide (OZEMPIC) 2 MG/3ML, Inject 0.25 mg into the skin once a week for 28 days, THEN 0.5 mg once a week., Disp: 3 mL, Rfl: 3  [4]   Allergies  Allergen Reactions    Cephalexin Rash   [5]   Past Medical History:  Diagnosis Date    Diabetes mellitus     Hyperlipidemia     Hypertension    [6]   Social History  Tobacco Use    Smoking status: Former     Types: Cigarettes    Tobacco comments:     Quit smoking June 1989, smoked 30 years before    Vaping Use    Vaping status: Never Used   Substance Use Topics    Alcohol use: Not Currently    Drug use: Not Currently

## 2023-04-10 ENCOUNTER — Ambulatory Visit (INDEPENDENT_AMBULATORY_CARE_PROVIDER_SITE_OTHER): Payer: Medicare Other

## 2023-04-10 DIAGNOSIS — I10 Essential (primary) hypertension: Secondary | ICD-10-CM

## 2023-04-10 DIAGNOSIS — E785 Hyperlipidemia, unspecified: Secondary | ICD-10-CM

## 2023-04-18 ENCOUNTER — Ambulatory Visit (INDEPENDENT_AMBULATORY_CARE_PROVIDER_SITE_OTHER): Payer: Medicare Other | Admitting: Family Medicine

## 2023-04-18 ENCOUNTER — Encounter (INDEPENDENT_AMBULATORY_CARE_PROVIDER_SITE_OTHER): Payer: Self-pay | Admitting: Family Medicine

## 2023-04-25 ENCOUNTER — Telehealth (INDEPENDENT_AMBULATORY_CARE_PROVIDER_SITE_OTHER): Payer: Self-pay

## 2023-04-25 NOTE — Telephone Encounter (Signed)
Relayed to the patient as above. He is feeling well, the condition is stable.

## 2023-04-25 NOTE — Telephone Encounter (Signed)
-----   Message from Urban Gibson sent at 04/20/2023  3:45 PM EDT -----  CT coronary angiogram with moderate blockages and patent LIMA bypass graft. Recommend continued medical management. Make sure no change in way he feels since last visit with senait. Have him come in for follow up NP visit to review in detail.

## 2023-05-02 ENCOUNTER — Ambulatory Visit (INDEPENDENT_AMBULATORY_CARE_PROVIDER_SITE_OTHER): Payer: Medicare Other | Admitting: Family Medicine

## 2023-05-03 ENCOUNTER — Other Ambulatory Visit (INDEPENDENT_AMBULATORY_CARE_PROVIDER_SITE_OTHER): Payer: Self-pay

## 2023-05-03 NOTE — Progress Notes (Signed)
Spoke to Carl [name] regarding his past due INR check. Pt states he'll try to get INR done this week.     [name], CPhT III  [facility]

## 2023-05-09 ENCOUNTER — Other Ambulatory Visit (INDEPENDENT_AMBULATORY_CARE_PROVIDER_SITE_OTHER): Payer: Self-pay

## 2023-05-09 NOTE — Progress Notes (Signed)
Left VMM for Carl [name] regarding his past due INR check.    Carl [name], CPhT III  [facility]

## 2023-05-11 ENCOUNTER — Ambulatory Visit (INDEPENDENT_AMBULATORY_CARE_PROVIDER_SITE_OTHER): Payer: Medicare Other | Admitting: Nurse Practitioner

## 2023-05-11 ENCOUNTER — Encounter (INDEPENDENT_AMBULATORY_CARE_PROVIDER_SITE_OTHER): Payer: Self-pay | Admitting: Nurse Practitioner

## 2023-05-11 ENCOUNTER — Ambulatory Visit (INDEPENDENT_AMBULATORY_CARE_PROVIDER_SITE_OTHER): Payer: Medicare Other

## 2023-05-11 VITALS — BP 112/65 | HR 80 | Ht 69.0 in | Wt 212.0 lb

## 2023-05-11 DIAGNOSIS — I152 Hypertension secondary to endocrine disorders: Secondary | ICD-10-CM

## 2023-05-11 DIAGNOSIS — I4892 Unspecified atrial flutter: Secondary | ICD-10-CM

## 2023-05-11 DIAGNOSIS — E1159 Type 2 diabetes mellitus with other circulatory complications: Secondary | ICD-10-CM

## 2023-05-11 DIAGNOSIS — I2510 Atherosclerotic heart disease of native coronary artery without angina pectoris: Secondary | ICD-10-CM

## 2023-05-11 DIAGNOSIS — E1169 Type 2 diabetes mellitus with other specified complication: Secondary | ICD-10-CM

## 2023-05-11 DIAGNOSIS — I5022 Chronic systolic (congestive) heart failure: Secondary | ICD-10-CM

## 2023-05-11 DIAGNOSIS — E785 Hyperlipidemia, unspecified: Secondary | ICD-10-CM

## 2023-05-11 LAB — POCT PROTHROMBIN TIME (PT): INR POCT: 3.1 — AB (ref 2–3)

## 2023-05-11 NOTE — Progress Notes (Signed)
May 11, 2023    INR:    Lab Results   Component Value Date    INR 3.1 (A) 05/11/2023     via lab draw    INR Goal:  2-3    Warfarin Indication: Paroxysmal AFib/AFlutter     CHA2DS2-VASc Score:  5 HTN (1), Age >/=75 (2), DM (1), and Vascular Dx (1)  HASBLED Score:  1 Elderly (Age>65)    Referring Provider: Dr. Demetrius Revel     Referral Date:  09/06/22     Last visit with referring provider:  09/01/22     PMH:    Past Medical History:   Diagnosis Date    Diabetes mellitus     Hyperlipidemia     Hypertension      Current warfarin dose:    Instructed Dose Reported Dose   Warfarin 5 mg Tues/Thurs/Sun and 7.5 mg all other days.   No deviation from instructed dose.  Denies missed or extras dose(s).   7-day warfarin total:  45 mg 7-day warfarin total:  45 mg      Lab Results   Component Value Date    INR 3.1 (A) 05/11/2023    INR 2.7 04/03/2023    INR 2.4 03/03/2023    INR 3.1 (H) 02/08/2023    INR 2.5 (H) 01/18/2023    INR 3.0 (H) 01/09/2023     Dietary intake of vitamin K: Inconsistent. Typically 4-5 servings of green beans and salad (Spring mix, carrots, tomatoes, onions) per week. May have 1 serving of high vitamin K vegetables (collard greens) once monthly.  Patient is in assisted living facility and eats whatever is served at the facility. Reports no significant change in diet, denies any recent collard greens.      Alcohol consumption:  No alcohol consumption since 1989.      Tobacco use: Former, quit in 1989      Medication changes:  Lasix 20mg  increased to 40mg  daily for leg edema last month.     New Medication/Dietary/Herbal Interactions: na     Concurrent significant interacting medications/supplements:    Medications that may enhance anticoagulant effect or bleeding risks:  APAP (patient takes prn <2000 mg/day)  Allopurinol  Glucosamine  Omega-3 fatty acids  Medications that may diminish anticoagulant effect:  Vitamin C  Metformin  MVI ([employer] brand, vitamin K 25 mcg).     S/sx of bleeding/clotting:   Denies     Recent Falls:  Denies.   Patient uses cane to assist with mobility.      Upcoming procedures/surgeries:  Denies.  Clarified that no cancer found on jaw.      Is patient a candidate for DOAC?  Yes    If yes, date and outcome of discussion: 10/06/22: Per pharmacy liaison, copay for Eliquis is $771.30/90 days (periodic deductible $545+coinsurance $226.30). Once deductible met, Eliquis would be ~$75/month. Patient has Medicare and not eligible for copay card.  Patient prefers to continue warfarin for now.     Others: na  Instructed pt to check INR in the morning M-Th.  INR received after 3pm will be addressed the following business day.  Instructed patient to call [facility] if no call received by 3PM the day after INR testing.     Best Contact:  Best time to reach patient is 8a-3pm.     =======================================    A/P:    INR 3.1 slightly above therapeutic range.   Continue warfarin 5 mg Tues/Thurs/Sun and 7.5 mg all other days.  Recheck INR in 3-4 weeks on 06/06/23 via lab   Pt is interested in home POCT, will submit to [employer]  Discussed the importance of consistent vit K diet and how vit K affects INR.  Vit K food list provided to patient.   Advised pt to monitor weight daily and reach out to provider if weight gain > 2 lbs in a day or >5 lbs within the course of the week.   Warfarin has been managed per Clinic Protocol    Patient verbalized understanding of instructions provided.      Burnell Blanks, PharmD, BCPS  Clinical Pharmacy Specialist-Anticoagulation

## 2023-05-11 NOTE — Patient Instructions (Addendum)
F/U with Dr. Nile Riggs in 3 month.   INR check today if possible.

## 2023-05-11 NOTE — Progress Notes (Signed)
[facility]  OFFICE VISIT    I had the pleasure of seeing Carl [name] today for cardiovascular follow up. He is a pleasant 82 y.o. male with a history of paroxysmal atrial fibrillation, CABG in 2005, and heartburn who presents for a follow up.     He is not having any chest pain shortness of breath. He uses the gym at the retirement community.       He continues to experience bilateral lower extremity edema.  He is currently on Lasix 40 mg once a day and wears compression stockings which helps with the swelling.    He does admit that he eats deli meat for lunch every day and eats dinner at the retirement community.  We discussed about possibly requesting for him to get normal sodium dinners which she will try to do.    MEDICATIONS:   Current Outpatient Medications:     acetaminophen (TYLENOL) 500 MG tablet, Take 1 tablet (500 mg) by mouth every 6 (six) hours as needed, Disp: , Rfl:     albuterol sulfate HFA (PROVENTIL) 108 (90 Base) MCG/ACT inhaler, INHALE TWO PUFFS BY MOUTH EVERY 6 HOURS AS NEEDED FOR WHEEZING OR SHORTNESS OF BREATH, Disp: , Rfl:     allopurinol (ZYLOPRIM) 300 MG tablet, Take 1 tablet (300 mg) by mouth daily, Disp: 90 tablet, Rfl: 3    amLODIPine (NORVASC) 10 MG tablet, Take 1 tablet (10 mg) by mouth daily, Disp: , Rfl:     Ascorbic Acid (vitamin C) 1000 MG tablet, Take 1 tablet (1,000 mg) by mouth daily, Disp: , Rfl:     atorvastatin (LIPITOR) 40 MG tablet, Take 1 tablet (40 mg) by mouth daily, Disp: , Rfl:     B Complex Vitamins (vitamin B complex) Tab, Take 1 tablet by mouth daily, Disp: , Rfl:     cloNIDine (CATAPRES) 0.2 MG tablet, Take 1 tablet (0.2 mg) by mouth 2 (two) times daily, Disp: , Rfl:     furosemide (LASIX) 20 MG tablet, Take 2 tablets (40 mg) by mouth daily, Disp: 180 tablet, Rfl: 3    glipiZIDE (GLUCOTROL) 10 MG 24 hr tablet, Take 1 tablet (10 mg) by mouth daily, Disp: 90 tablet, Rfl: 3    Glucosamine-Chondroit-Vit C-Mn (GLUCOSAMINE 1500 COMPLEX PO), Take 2  tablets by mouth daily, Disp: , Rfl:     glucose blood (OneTouch Ultra) test strip, 1 each by Other route daily Use as instructed, Disp: 100 strip, Rfl: 5    hydrALAZINE (APRESOLINE) 25 MG tablet, Take 1 tablet (25 mg) by mouth as needed Takes as needed, Disp: , Rfl:     isosorbide mononitrate (IMDUR) 30 MG 24 hr tablet, Take 1 tablet (30 mg) by mouth daily, Disp: , Rfl:     Lancets (OneTouch Delica Plus Lancet33G) Misc, Inject 1 Unit into the skin daily USE DAILY AS DIRECTED, Disp: 100 each, Rfl: 5    lisinopril (ZESTRIL) 40 MG tablet, Take 1 tablet (40 mg) by mouth daily, Disp: , Rfl:     metoprolol succinate (TOPROL-XL) 100 MG 24 hr tablet, Take 1 tablet (100 mg) by mouth daily, Disp: 90 tablet, Rfl: 3    Multiple Vitamin (Multi-Vitamin) tablet, Take 1 tablet by mouth daily, Disp: , Rfl:     nitroglycerin (NITROSTAT) 0.4 MG SL tablet, Place 1 tablet (0.4 mg) under the tongue every 5 (five) minutes as needed for Chest pain, Disp: 30 tablet, Rfl: 5    Omega-3 Fatty Acids (fish oil) 1000 MG Cap capsule,  Take 1 capsule (1,000 mg) by mouth 2 (two) times daily, Disp: , Rfl:     Vitamin D, Cholecalciferol, 50 MCG (2000 UT) Cap, Take 2,000 Units by mouth daily, Disp: , Rfl:     warfarin (COUMADIN) 5 MG tablet, Take 1.5 tablets (7.5 mg) by mouth every evening, Disp: 90 tablet, Rfl: 2    metFORMIN (GLUCOPHAGE-XR) 500 MG 24 hr tablet, Take 2 tablets (1,000 mg) by mouth 2 (two) times daily, Disp: 360 tablet, Rfl: 0    semaglutide (OZEMPIC) 2 MG/3ML, Inject 0.25 mg into the skin once a week for 28 days, THEN 0.5 mg once a week. (Patient not taking: Reported on 05/11/2023), Disp: 3 mL, Rfl: 3     REVIEW OF SYSTEMS: All other systems reviewed and negative except as above.    PHYSICAL EXAMINATION  General Appearance: A well-appearing male in no acute distress.   Vital Signs: BP 112/65 (BP Site: Left arm, Patient Position: Sitting, Cuff Size: Medium)   Pulse 80   Ht 1.753 m (5\' 9" )   Wt 96.2 kg (212 lb)   SpO2 95%   BMI 31.31  kg/m    Wt Readings from Last 4 Encounters:   05/11/23 0946 96.2 kg (212 lb)   04/07/23 1125 95.8 kg (211 lb 3.2 oz)   02/22/23 1130 95.3 kg (210 lb)   02/08/23 0928 97.1 kg (214 lb)     HEENT: Sclera anicteric, conjunctiva without pallor, moist mucous membranes, normal dentition.   Neck: Supple without jugular venous distention. Thyroid nonpalpable. Normal carotid upstrokes without bruits.  Chest: Clear to auscultation bilaterally with good air movement and respiratory effort and no wheezes, rales, or rhonchi  Cardiovascular: Normal S1 and physiologically split S2 without murmurs, gallops or rub. PMI of normal size and nondisplaced.   Abdomen: Soft, nontender. No organomegaly.  No pulsatile masses or bruits.    Extremities: Warm without edema. All peripheral pulses are full and equal.  Skin: No rash, xanthoma or xanthelasma.   Neuro: Alert and oriented x3. Grossly intact. Strength is symmetrical. Normal mood and affect.       LABS:   Lab Results   Component Value Date    WBC 10.69 (H) 12/23/2022    HGB 14.1 12/23/2022    HCT 41.4 12/23/2022    PLT 231 12/23/2022    NA 142 02/08/2023    K 4.7 02/08/2023    MG 1.8 01/09/2023    BUN 16.0 02/08/2023    CREAT 1.1 02/08/2023    GLU 111 (H) 02/08/2023    CHOL 144 10/26/2022    TRIG 121 10/26/2022    HDL 45 10/26/2022    LDL 75 10/26/2022    AST 26 10/26/2022    ALT 25 10/26/2022    HGBA1C 7.2 (A) 04/07/2023    TSH 1.17 07/20/2022      Coronary CTA 02/13/2023:   CARDIAC:     1. Status post CABG with a patent LIMA to LAD graft. Distal to the graft,  mixed calcified and noncalcified plaque in the distal LAD results in  moderate (50-69%) stenosis.  2. Calcified plaque in the mid circumflex artery results in mild (25-49%)  stenosis. Left dominant coronary artery system.  3. Patent small caliber RCA without plaque or stenosis.     NON-CARDIAC:     1. Mild emphysema.     Carlynn Spry, MD  02/13/2023 6:07 PM      Echocardiogram 09/22/2022:     Summary    * The  left ventricle is  normal in size.    * There is normal left ventricular geometry.    * Left ventricular systolic function is normal with an ejection fraction by  Biplane Method of Discs of  67 %.    * Left ventricular segmental wall motion is normal.    * Left ventricular diastolic filling parameters demonstrate normal diastolic  function.    * The right ventricular cavity size is normal in size.    * Normal right ventricular systolic function.    * The left atrium is mildly dilated.    * There is mild aortic valve sclerosis/fibrocalcific changes vs mild aortic  stenosis.    * No pulmonary hypertension with estimated right ventricular systolic  pressure of  21 mmHg.    * There is moderate mitral annular calcification.    * No prior study is available for comparison.    IMPRESSION/RECOMMENDATIONS: Carl [name] is a 82 y.o. male who presents for follow up.     For history of paroxysmal atrial fibrillation patient is on Eliquis for stroke prophylaxis without any bleeding complications.  He does use a cane for balance.  We discussed about Watchman device and the benefit it may have for him.  He will discuss this further with Dr. Nile Riggs on his next office visit which would be in 3 months.      He has a history of CABG in 2005 and completed coronary CT angiogram on February 13, 2023 which showed moderate disease, 50-69% stenosis and patent bypass graft.  The recommendation by Dr. Nile Riggs is to continue medical therapy.      Plan:   Continue current medications.  Continue to wear compression stockings to help with lower extremity edema.  Limit sodium intake to less than 2000 mg a day.  Follow-up with our office in 3 months.  Incident to service performed with physician present in the office in accordance with this patient's established plan of care.

## 2023-05-16 ENCOUNTER — Encounter (INDEPENDENT_AMBULATORY_CARE_PROVIDER_SITE_OTHER): Payer: Self-pay

## 2023-05-16 ENCOUNTER — Other Ambulatory Visit (INDEPENDENT_AMBULATORY_CARE_PROVIDER_SITE_OTHER): Payer: Self-pay | Admitting: Family Medicine

## 2023-06-14 ENCOUNTER — Other Ambulatory Visit (INDEPENDENT_AMBULATORY_CARE_PROVIDER_SITE_OTHER): Payer: Self-pay

## 2023-06-14 NOTE — Progress Notes (Signed)
Spoke to [name] regarding his past due INR check.  Pt states he will go tomorrow for his PT/INR.       [name], CPhT III  [facility]

## 2023-06-19 ENCOUNTER — Ambulatory Visit (INDEPENDENT_AMBULATORY_CARE_PROVIDER_SITE_OTHER): Payer: Self-pay

## 2023-06-19 DIAGNOSIS — I4892 Unspecified atrial flutter: Secondary | ICD-10-CM

## 2023-06-19 LAB — EXTERNAL PT/INR: PT INR: 3.5 — AB (ref 0.9–1.1)

## 2023-06-19 NOTE — Progress Notes (Signed)
June 19, 2023    INR:    Lab Results   Component Value Date    INR 3.5 (A) 06/19/2023     via home POCT. This is the first INR via home meter.     INR Goal:  2-3    Warfarin Indication: Paroxysmal AFib/AFlutter     CHA2DS2-VASc Score:  5 HTN (1), Age >/=75 (2), DM (1), and Vascular Dx (1)  HASBLED Score:  1 Elderly (Age>65)    Referring Provider: Dr. Demetrius Revel     Referral Date:  09/06/22     Last visit with referring provider:  09/01/22     PMH:    Past Medical History:   Diagnosis Date    Diabetes mellitus     Hyperlipidemia     Hypertension      Current warfarin dose:    Instructed Dose Reported Dose   Warfarin 5 mg Tues/Thurs/Sun and 7.5 mg all other days.   No deviation from instructed dose.  Denies missed or extras dose(s).   7-day warfarin total:  45 mg 7-day warfarin total:  45 mg      Lab Results   Component Value Date    INR 3.5 (A) 06/19/2023    INR 3.1 (A) 05/11/2023    INR 2.7 04/03/2023    INR 2.4 03/03/2023    INR 3.1 (H) 02/08/2023    INR 2.5 (H) 01/18/2023     Dietary intake of vitamin K: Inconsistent. Typically 4-5 servings of green beans and salad (Spring mix, carrots, tomatoes, onions) per week. May have 1 serving of high vitamin K vegetables (collard greens) once monthly. Patient is in assisted living facility and eats whatever is served at the facility. Reports no significant change in diet, denies any recent collard greens. Pt might have collard greens sometime this week.      Alcohol consumption:  No alcohol consumption since 1989.      Tobacco use: Former, quit in 1989      Medication changes:  Denies new.      New Medication/Dietary/Herbal Interactions: N/A     Concurrent significant interacting medications/supplements:    Medications that may enhance anticoagulant effect or bleeding risks:  APAP (patient takes prn <2000 mg/day)  Allopurinol  Glucosamine  Omega-3 fatty acids  Medications that may diminish anticoagulant effect:  Vitamin C  Metformin  MVI ([employer] brand, vitamin K 25  mcg).     S/sx of bleeding/clotting:  Denies.     Recent Falls: Denies.   Patient uses cane to assist with mobility.      Upcoming procedures/surgeries:  Denies.  Clarified that no cancer found on jaw.      Is patient a candidate for DOAC?  Yes    If yes, date and outcome of discussion: 10/06/22: Per pharmacy liaison, copay for Eliquis is $771.30/90 days (periodic deductible $545+coinsurance $226.30). Once deductible met, Eliquis would be ~$75/month. Patient has Medicare and not eligible for copay card.  Patient prefers to continue warfarin for now.     Others:   Instructed pt to check INR in the morning M-Th.  INR received after 3pm will be addressed the following business day.  Instructed patient to call [facility] if no call received by 3PM the day after INR testing.   06/19/23: Reports last week wt 209 lbs. Reports swelling same not improved but not worsened either. Denies fever, N/V/D, cough/SOB/congestion. Denies experiencing significant amount of stress or acute pain.     Best Contact:  Best time to reach patient is 8a-3pm.    =======================================    A/P:    INR 3.5 above therapeutic range due to unclear reason per pt interview. Of note, pt got [employer] training today and this was 1st INR reported. Pt feels comfortable with machine and states he can do the next INR check.  Warfarin 5 mg x1 (10/14) then continue warfarin 5 mg Tues/Thurs/Sun and 7.5 mg all other days.  Recheck INR in 1 week on 06/26/23 via home POCT.   Discussed the importance of consistent vit K diet and how vit K affects INR.    Advised pt to monitor weight daily and reach out to provider if weight gain > 2 lbs in a day or >5 lbs within the course of the week.   Warfarin has been managed per Clinic Protocol    Patient verbalized understanding of instructions provided.      Lucendia Herrlich, PharmD  Clinical Pharmacy Specialist  [facility]  Ph: [phone]

## 2023-06-29 ENCOUNTER — Other Ambulatory Visit (INDEPENDENT_AMBULATORY_CARE_PROVIDER_SITE_OTHER): Payer: Self-pay

## 2023-06-29 NOTE — Progress Notes (Signed)
Left VMM for Carl [name] regarding his past due INR check.    [name], CPhT III  [facility]

## 2023-07-03 ENCOUNTER — Other Ambulatory Visit (INDEPENDENT_AMBULATORY_CARE_PROVIDER_SITE_OTHER): Payer: Self-pay

## 2023-07-03 NOTE — Progress Notes (Signed)
Spoke to [name] regarding his past due INR check. Pt will check his INR today.     Carl [name], CPhT III  [facility]

## 2023-07-04 ENCOUNTER — Ambulatory Visit (INDEPENDENT_AMBULATORY_CARE_PROVIDER_SITE_OTHER): Payer: Self-pay

## 2023-07-04 LAB — EXTERNAL PT/INR: PT INR: 2.7 — AB (ref 0.9–1.1)

## 2023-07-04 NOTE — Progress Notes (Signed)
July 04, 2023    INR:    Lab Results   Component Value Date    INR 2.7 (A) 07/04/2023     via home POCT     INR Goal:  2-3    Warfarin Indication: Paroxysmal AFib/AFlutter     CHA2DS2-VASc Score:  5 HTN (1), Age >/=75 (2), DM (1), and Vascular Dx (1)  HASBLED Score:  1 Elderly (Age>65)    Referring Provider: Dr. Demetrius Revel     Referral Date:  09/06/22     Last visit with referring provider:  09/01/22     PMH:    Past Medical History:   Diagnosis Date    Diabetes mellitus     Hyperlipidemia     Hypertension      Current warfarin dose:    Instructed Dose Reported Dose   Warfarin 5 mg x1 (10/14) then continue warfarin 5 mg Tues/Thurs/Sun and 7.5 mg all other days.   No deviation from instructed dose.  Denies missed or extras dose(s).   7-day warfarin total:  45 mg 7-day warfarin total:  45 mg      Lab Results   Component Value Date    INR 2.7 (A) 07/04/2023    INR 3.5 (A) 06/19/2023    INR 3.1 (A) 05/11/2023    INR 2.7 04/03/2023    INR 2.4 03/03/2023    INR 3.1 (H) 02/08/2023     Dietary intake of vitamin K: Inconsistent. Typically 4-5 servings of green beans and salad (Spring mix, carrots, tomatoes, onions) per week. May have 1 serving of high vitamin K vegetables (collard greens) once monthly. Patient is in assisted living facility and eats whatever is served at the facility. Reports no significant change in diet.      Alcohol consumption:  No alcohol consumption since 1989.      Tobacco use: Former, quit in 1989      Medication changes:  Denies new.      New Medication/Dietary/Herbal Interactions: N/A     Concurrent significant interacting medications/supplements:    Medications that may enhance anticoagulant effect or bleeding risks:  APAP (patient takes prn <2000 mg/day)  Allopurinol  Glucosamine  Omega-3 fatty acids  Medications that may diminish anticoagulant effect:  Vitamin C  Metformin  MVI ([employer] brand, vitamin K 25 mcg).     S/sx of bleeding/clotting:  Denies.     Recent Falls: Denies.   Patient  uses cane to assist with mobility.      Upcoming procedures/surgeries:  Denies.  Clarified that no cancer found on jaw.      Is patient a candidate for DOAC?  Yes    If yes, date and outcome of discussion: 10/06/22: Per pharmacy liaison, copay for Eliquis is $771.30/90 days (periodic deductible $545+coinsurance $226.30). Once deductible met, Eliquis would be ~$75/month. Patient has Medicare and not eligible for copay card.  Patient prefers to continue warfarin for now.     Others:   Instructed pt to check INR in the morning M-Th.  INR received after 3pm will be addressed the following business day.  Instructed patient to call [facility] if no call received by 3PM the day after INR testing.   06/19/23: Reports last week wt 209 lbs. Reports swelling same not improved but not worsened either. Denies fever, N/V/D, cough/SOB/congestion. Denies experiencing significant amount of stress or acute pain.     Best Contact:  Best time to reach patient is 8a-3pm.    =======================================  A/P:    INR 2.7 within therapeutic range with adjusted dose.  Last INR (3.5) above therapeutic range due to unclear reason per pt interview. Of note, pt received [employer] training that day and this was his 1st INR reported. Pt feels comfortable with machine and states he can do the next INR check.  Continue warfarin 5 mg Tues/Thurs/Sun and 7.5 mg all other days.  Recheck INR in 1 week on 07/10/23 via home POCT. Pt prefers to check Monday.   Pt has been advised previously to monitor weight daily and reach out to provider if weight gain > 2 lbs in a day or >5 lbs within the course of the week.   Warfarin has been managed per Clinic Protocol    Patient verbalized understanding of instructions provided.  Interview conducted via telephone.    [name], CPhT III  [facility]    Cosigned by:    Carolynne Edouard, PharmD  Pharmacy Clinical Specialist   [facility]   Ph: [phone]

## 2023-07-10 ENCOUNTER — Ambulatory Visit (INDEPENDENT_AMBULATORY_CARE_PROVIDER_SITE_OTHER): Payer: Self-pay

## 2023-07-10 LAB — EXTERNAL PT/INR: PT INR: 2.6 — AB (ref 0.9–1.1)

## 2023-07-10 NOTE — Progress Notes (Signed)
 July 10, 2023    INR:    Lab Results   Component Value Date    INR 2.6 (A) 07/10/2023     via home POCT     INR Goal:  2-3    Warfarin Indication: Paroxysmal AFib/AFlutter     CHA2DS2-VASc Score:  5 HTN (1), Age >/=75 (2), DM (1), and Vascular Dx (1)

## 2023-07-12 ENCOUNTER — Encounter (INDEPENDENT_AMBULATORY_CARE_PROVIDER_SITE_OTHER): Payer: Self-pay | Admitting: Family Medicine

## 2023-07-12 ENCOUNTER — Ambulatory Visit (INDEPENDENT_AMBULATORY_CARE_PROVIDER_SITE_OTHER): Payer: Medicare Other | Admitting: Family Medicine

## 2023-07-12 VITALS — BP 115/63 | HR 72 | Temp 98.2°F | Resp 18 | Ht 69.0 in | Wt 213.0 lb

## 2023-07-12 DIAGNOSIS — E1169 Type 2 diabetes mellitus with other specified complication: Secondary | ICD-10-CM

## 2023-07-12 DIAGNOSIS — D0330 Melanoma in situ of unspecified part of face: Secondary | ICD-10-CM

## 2023-07-12 DIAGNOSIS — E1159 Type 2 diabetes mellitus with other circulatory complications: Secondary | ICD-10-CM

## 2023-07-12 DIAGNOSIS — E162 Hypoglycemia, unspecified: Secondary | ICD-10-CM

## 2023-07-12 DIAGNOSIS — I152 Hypertension secondary to endocrine disorders: Secondary | ICD-10-CM

## 2023-07-12 DIAGNOSIS — E669 Obesity, unspecified: Secondary | ICD-10-CM

## 2023-07-12 DIAGNOSIS — I5022 Chronic systolic (congestive) heart failure: Secondary | ICD-10-CM

## 2023-07-12 DIAGNOSIS — E785 Hyperlipidemia, unspecified: Secondary | ICD-10-CM

## 2023-07-12 LAB — POCT HEMOGLOBIN A1C: POCT Hgb A1C: 7.3 % — AB (ref 3.9–5.9)

## 2023-07-12 MED ORDER — DULAGLUTIDE 0.75 MG/0.5ML SC SOAJ
0.7500 mg | SUBCUTANEOUS | 3 refills | Status: DC
Start: 2023-07-12 — End: 2023-09-10

## 2023-07-12 MED ORDER — DEXCOM G7 RECEIVER DEVI
1.0000 | 0 refills | Status: DC
Start: 2023-07-12 — End: 2023-09-10

## 2023-07-12 MED ORDER — GLIPIZIDE ER 5 MG PO TB24
5.0000 mg | ORAL_TABLET | Freq: Every day | ORAL | 3 refills | Status: DC
Start: 2023-07-12 — End: 2023-09-27

## 2023-07-12 NOTE — Patient Instructions (Addendum)
 Lets try dex com to keep an eye on the sugar.   Lets try trulicity for diabetes and weight loss  Come back in 1 month for medicare wellness.   Fasting for 9 hours except for water and black coffee.   cut back on the glipizide to 5 mg a day.     Thank you,

## 2023-07-12 NOTE — Progress Notes (Signed)
 Have you seen any specialists since your last visit with [place]?  Yes      The patient was informed that the following HM items are still outstanding:     Health Maintenance Due   Topic Date Due    Advance Directive on File  Never done    OPHTHALMOLOGY EXAM  N

## 2023-07-12 NOTE — Progress Notes (Signed)
 Subjective:      Date: 07/12/2023 9:16 AM   Patient ID: Carl [name] is a 82 y.o. male.    Chief Complaint:  Chief Complaint   Patient presents with    Diabetes Follow-up       HPI:  Diabetes , and the patient is taking the medication daily without side

## 2023-07-17 ENCOUNTER — Ambulatory Visit (INDEPENDENT_AMBULATORY_CARE_PROVIDER_SITE_OTHER): Payer: Self-pay

## 2023-07-17 DIAGNOSIS — I4892 Unspecified atrial flutter: Secondary | ICD-10-CM

## 2023-07-17 LAB — EXTERNAL PT/INR: PT INR: 3.6 — AB (ref 0.9–1.1)

## 2023-07-17 NOTE — Progress Notes (Signed)
 July 17, 2023    INR:    Lab Results   Component Value Date    INR 3.6 (A) 07/17/2023     via home POCT     INR Goal:  2-3    Warfarin Indication: Paroxysmal AFib/AFlutter     CHA2DS2-VASc Score:  5 HTN (1), Age >/=75 (2), DM (1), and Vascular Dx (1)

## 2023-07-21 ENCOUNTER — Other Ambulatory Visit (INDEPENDENT_AMBULATORY_CARE_PROVIDER_SITE_OTHER): Payer: Self-pay | Admitting: Cardiovascular Disease

## 2023-07-21 DIAGNOSIS — I480 Paroxysmal atrial fibrillation: Secondary | ICD-10-CM

## 2023-07-21 MED ORDER — WARFARIN SODIUM 5 MG PO TABS
7.5000 mg | ORAL_TABLET | Freq: Every evening | ORAL | 2 refills | Status: DC
Start: 2023-07-21 — End: 2023-10-17

## 2023-07-21 NOTE — Telephone Encounter (Signed)
 Name, strength, directions of requested refill(s):    warfarin (COUMADIN) 5 MG tablet    How much medication is remaining: 1 pill    Pharmacy to send refill to or patient to pick up rx from office (mark requested pharmacy in [place]):      [facility]

## 2023-07-25 ENCOUNTER — Ambulatory Visit (INDEPENDENT_AMBULATORY_CARE_PROVIDER_SITE_OTHER): Payer: Self-pay

## 2023-07-25 LAB — EXTERNAL PT/INR: PT INR: 2.6 — AB (ref 0.9–1.1)

## 2023-07-25 NOTE — Progress Notes (Signed)
 July 25, 2023    INR:    Lab Results   Component Value Date    INR 2.6 (A) 07/25/2023     via home POCT     INR Goal:  2-3    Warfarin Indication: Paroxysmal AFib/AFlutter     CHA2DS2-VASc Score:  5 HTN (1), Age >/=75 (2), DM (1), and Vascular Dx (1)

## 2023-08-01 ENCOUNTER — Ambulatory Visit (INDEPENDENT_AMBULATORY_CARE_PROVIDER_SITE_OTHER): Payer: Self-pay

## 2023-08-01 DIAGNOSIS — I4892 Unspecified atrial flutter: Secondary | ICD-10-CM

## 2023-08-01 LAB — EXTERNAL PT/INR: PT INR: 2.7 — AB (ref 0.9–1.1)

## 2023-08-01 NOTE — Progress Notes (Signed)
August 01, 2023    INR Goal:  2-3    Warfarin Indication: Paroxysmal AFib/AFlutter     CHA2DS2-VASc Score:  5 HTN (1), Age >/=75 (2), DM (1), and Vascular Dx (1)  HASBLED Score:  1 Elderly (Age>65)    Referring Provider: Dr. Demetrius Revel     Referral Date:  09/06/22    Last visit with referring provider:  09/01/22     PMH:    Past Medical History:   Diagnosis Date    Diabetes mellitus     Hyperlipidemia     Hypertension      Current warfarin dose:    Instructed Dose Reported Dose   Warfarin 5 mg Tues/Thurs/Sun and 7.5 mg all other days    No deviation from instructed dose. dDenies missed or extras dose(s).   7-day warfarin total:  45 mg 7-day warfarin total:  45 mg      Lab Results   Component Value Date    INR 2.7 (A) 08/01/2023    INR 2.6 (A) 07/25/2023    INR 3.6 (A) 07/17/2023    INR 2.6 (A) 07/10/2023    INR 2.7 (A) 07/04/2023    INR 3.5 (A) 06/19/2023     Dietary intake of vitamin K: Inconsistent. Typically 4-5 servings of green beans and salad (Spring mix, carrots, tomatoes, onions) per week. May have 1 serving of high vitamin K vegetables (collard greens) once monthly. Patient is in assisted living facility and eats whatever is served at the facility. Pt denies changes.      Alcohol consumption: No alcohol consumption since 1989.      Tobacco use: Former, quit in 1989      Medication changes:  Denies new.      New Medication/Dietary/Herbal Interactions: N/A     Concurrent significant interacting medications/supplements:    Medications that may enhance anticoagulant effect or bleeding risks:  APAP (patient takes prn <2000 mg/day)  Allopurinol  Glucosamine  Omega-3 fatty acids  Medications that may diminish anticoagulant effect:  Vitamin C  Metformin  MVI ([employer] brand, vitamin K 25 mcg).     S/sx of bleeding/clotting:  Denies.     Recent Falls: Denies.   Patient uses cane to assist with mobility.      Upcoming procedures/surgeries: Reports he'll have a dermatology procedure on 08/08/23 with [facility]  dermatology, does not recall the name of procedure and no plans for warfarin interruption.  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 LVM requesting CB about instructions.      Is patient a candidate for DOAC?  Yes    If yes, date and outcome of discussion: 10/06/22: Per pharmacy liaison, copay for Eliquis is $771.30/90 days (periodic deductible $545+coinsurance $226.30). Once deductible met, Eliquis would be ~$75/month. Patient has Medicare and not eligible for copay card.  Patient prefers to continue warfarin for now.     Others:   Instructed pt to check INR in the morning M-Th.  INR received after 3pm will be addressed the following business day.  Instructed patient to call [facility] if no call received by 3PM the day after INR testing.     Best Contact:  Best time to reach patient is 8a-3pm.    INR Result:  Lab Results   Component Value Date    INR 2.7 (A) 08/01/2023     via home POCT    =======================================  A/P:  INR 2.7 within therapeutic range.   Continue warfarin 5 mg Tues/Thurs/Sun and 7.5 mg all other days (total 45 mg/wk).  Recheck INR in 1 week on 08/08/23 via home POCT. Pt prefers Mondays.  Discussed the potential enhanced anticoagulant effect from excessive cranberry consumption. Advised pt to minimize intake of alcohol (no more than 1-2 drinks) and cranberry products if served, and maintain consistent dietary vit K over the Thanksgiving celebration.   Instructed pt to call dermatology office and inquire about type of procedure/ if warfarin interruption is required and call [facility] back with more information.   Warfarin has been managed per Clinic Protocol    Patient verbalized understanding of instructions provided.  Interview conducted via telephone.    Thank you,    Lucendia Herrlich, PharmD  Clinical Pharmacy Specialist  [facility]  Ph: [phone]

## 2023-08-02 NOTE — Progress Notes (Addendum)
August 02, 2023    [facility]ology office. Byrd Hesselbach, front desk, confirmed with Dr. Barbaraann Faster from office no warfarin interruption needed for procedure (basal cell carcinoma removal) on 08/08/23. LVM to update pt.     Lucendia Herrlich, PharmD  Clinical Pharmacy Specialist  [facility]  Ph: [phone]

## 2023-08-04 ENCOUNTER — Other Ambulatory Visit (INDEPENDENT_AMBULATORY_CARE_PROVIDER_SITE_OTHER): Payer: Self-pay | Admitting: Cardiovascular Disease

## 2023-08-04 DIAGNOSIS — I2510 Atherosclerotic heart disease of native coronary artery without angina pectoris: Secondary | ICD-10-CM

## 2023-08-04 DIAGNOSIS — E1169 Type 2 diabetes mellitus with other specified complication: Secondary | ICD-10-CM

## 2023-08-04 DIAGNOSIS — I4811 Longstanding persistent atrial fibrillation: Secondary | ICD-10-CM

## 2023-08-04 DIAGNOSIS — I5022 Chronic systolic (congestive) heart failure: Secondary | ICD-10-CM

## 2023-08-04 DIAGNOSIS — I480 Paroxysmal atrial fibrillation: Secondary | ICD-10-CM

## 2023-08-04 DIAGNOSIS — Z951 Presence of aortocoronary bypass graft: Secondary | ICD-10-CM

## 2023-08-04 DIAGNOSIS — I4892 Unspecified atrial flutter: Secondary | ICD-10-CM

## 2023-08-04 DIAGNOSIS — I152 Hypertension secondary to endocrine disorders: Secondary | ICD-10-CM

## 2023-08-04 DIAGNOSIS — R600 Localized edema: Secondary | ICD-10-CM

## 2023-08-04 NOTE — Telephone Encounter (Signed)
Name, strength, directions of requested refill(s):  furosemide (LASIX) 20 MG tablet   isosorbide mononitrate (IMDUR) 30 MG 24 hr tablet   atorvastatin (LIPITOR) 40 MG tablet   cloNIDine (CATAPRES) 0.2 MG tablet   amLODIPine (NORVASC) 10 MG tablet   lisinopril (ZESTRIL) 40 MG tablet     Pharmacy to send refill to mark requested pharmacy in [place]):      [facility] [id number] [place], [place] - [address]  [address]  [place] [address]  Phone: [phone] Fax: [phone]    [facility] [id number] - [place], [place] - [address] [facility] [address]  [address]  [place] [address]  Phone: [phone] Fax: [phone]        Patient Preferred Callback Number: [phone]        Next visit: 08/09/2023         Carl [name]  [facility]

## 2023-08-05 ENCOUNTER — Ambulatory Visit (INDEPENDENT_AMBULATORY_CARE_PROVIDER_SITE_OTHER): Payer: Medicare Other | Admitting: Family

## 2023-08-05 ENCOUNTER — Encounter (INDEPENDENT_AMBULATORY_CARE_PROVIDER_SITE_OTHER): Payer: Self-pay | Admitting: Family

## 2023-08-05 VITALS — BP 113/73 | HR 92 | Temp 98.8°F | Resp 18 | Ht 69.0 in | Wt 205.0 lb

## 2023-08-05 DIAGNOSIS — R059 Cough, unspecified: Secondary | ICD-10-CM

## 2023-08-05 DIAGNOSIS — J189 Pneumonia, unspecified organism: Secondary | ICD-10-CM

## 2023-08-05 LAB — POC COVID QUICKVUE ANTIGEN: QuickVue SARS COV2 Antigen POCT: NEGATIVE

## 2023-08-05 LAB — POCT INFLUENZA A/B
POCT Rapid Influenza A AG: NEGATIVE
POCT Rapid Influenza B AG: NEGATIVE

## 2023-08-05 LAB — STREP A POCT GOHEALTH: Rapid Strep A Screen POCT: NEGATIVE

## 2023-08-05 MED ORDER — ALBUTEROL SULFATE HFA 108 (90 BASE) MCG/ACT IN AERS
2.0000 | INHALATION_SPRAY | RESPIRATORY_TRACT | 0 refills | Status: DC | PRN
Start: 2023-08-05 — End: 2023-09-11

## 2023-08-05 MED ORDER — AMOXICILLIN-POT CLAVULANATE 875-125 MG PO TABS
1.0000 | ORAL_TABLET | Freq: Two times a day (BID) | ORAL | 0 refills | Status: AC
Start: 2023-08-05 — End: 2023-08-12

## 2023-08-05 MED ORDER — BENZONATATE 200 MG PO CAPS
200.0000 mg | ORAL_CAPSULE | Freq: Three times a day (TID) | ORAL | 0 refills | Status: DC | PRN
Start: 2023-08-05 — End: 2023-09-10

## 2023-08-05 NOTE — Progress Notes (Signed)
[facility]  URGENT  CARE  PROGRESS NOTE     Patient: Carl [name]   Date: 08/05/2023   MRN: [id number]       Carl [name] is a 82 y.o. male      HISTORY     History obtained from: Patient    Chief Complaint   Patient presents with    Shortness of Breath     Pt c/o cough, chest congestion and shortness of breath. Onset 3 days ago (11/27). No otc medications today.         HPI   82 year old male with history of diabetes arrives for complaint of cough, congestion.  Reports that cough is productive.  Does report when he gets up he does get shortness of breath with exertion.  States symptoms began 3 days ago.  Denies any sore throat, headache, chest pain, nausea vomiting or diarrhea.  Review of Systems   Constitutional:  Negative for fever.   HENT:  Positive for congestion. Negative for sore throat.    Respiratory:  Positive for cough and shortness of breath.    Cardiovascular:  Negative for chest pain.       History:    Pertinent Past Medical, Surgical, Family and Social History were reviewed.      Current Medications[1]    Allergies[2]    Medications and Allergies reviewed.    PHYSICAL EXAM     Vitals:    08/05/23 1017   BP: 113/73   BP Site: Right arm   Patient Position: Sitting   Cuff Size: Medium   Pulse: 92   Resp: 18   Temp: 98.8 F (37.1 C)   TempSrc: Tympanic   SpO2: 96%   Weight: 93 kg (205 lb)   Height: 1.753 m (5\' 9" )       Physical Exam  Constitutional:       Appearance: Normal appearance.   HENT:      Head: Normocephalic and atraumatic.      Nose: Congestion present.      Mouth/Throat:      Mouth: Mucous membranes are moist.      Pharynx: No oropharyngeal exudate or posterior oropharyngeal erythema.   Cardiovascular:      Rate and Rhythm: Normal rate and regular rhythm.      Pulses: Normal pulses.      Heart sounds: Normal heart sounds.   Pulmonary:      Effort: Pulmonary effort is normal.      Breath sounds: Wheezing present.      Comments: And expiratory wheezing noted on the right  side  Neurological:      Mental Status: He is alert and oriented to person, place, and time.   Vitals and nursing note reviewed.         UCC COURSE     LABS  The following POCT tests were ordered, reviewed and discussed with the patient/family.     Results       Procedure Component Value Units Date/Time    QuickVue SARS-COV-2 Antigen POCT [[id number]]  (Normal) Collected: 08/05/23 1043    Specimen: Nasal Swab COVID-19 from Nares Updated: 08/05/23 1043     QuickVue SARS COV2 Antigen POCT Negative    POCT Influenza A/B [[id number]]  (Normal) Collected: 08/05/23 1043     Updated: 08/05/23 1043     POCT QC Pass     POCT Rapid Influenza A AG Negative     POCT Rapid Influenza B AG Negative    Rapid Strep A  POCT [[id number]]  (Normal) Collected: 08/05/23 1032    Specimen: Throat Updated: 08/05/23 1032     POCT QC Pass     Rapid Strep A Screen POCT Negative            There were no x-rays reviewed with this patient during the visit.    Current Inpatient Medications with Last Dose Taken[3]    PROCEDURES     Procedures    MEDICAL DECISION MAKING     History, physical, labs/studies most consistent with pneumonia as the diagnosis.        Chart Review:  Prior PCP, Specialist and/or ED notes reviewed today: No  Prior labs/images/studies reviewed today: No    Differential Diagnosis: Acute URI, influenza, COVID infection, pneumonia, strep, allergic rhinitis       ASSESSMENT     Encounter Diagnoses   Name Primary?    Cough, unspecified type Yes    Pneumonia due to infectious organism, unspecified laterality, unspecified part of lung                 PLAN      PLAN: COVID, flu, strep are negative.  Discussed with patient I suspect he likely has pneumonia due to the wheezing, shortness of breath with exertion.  Will start on a course of Augmentin however we will refrain from azithromycin or doxycycline for broader coverage since patient is on warfarin.  Recommend that he follow-up with his PCP and have this discussion if he needs to be on  a second antibiotic.  Will give a refill of his ProAir along with a course of Tessalon.  Instructed patient any new or worsening symptoms, can return to clinic or report to the ER.            Orders Placed This Encounter   Procedures    QuickVue SARS-COV-2 Antigen POCT    POCT Influenza A/B    Rapid Strep A POCT     Requested Prescriptions     Signed Prescriptions Disp Refills    amoxicillin-clavulanate (AUGMENTIN) 875-125 MG per tablet 14 tablet 0     Sig: Take 1 tablet by mouth 2 (two) times daily for 7 days    benzonatate (TESSALON) 200 MG capsule 30 capsule 0     Sig: Take 1 capsule (200 mg) by mouth 3 (three) times daily as needed for Cough    albuterol sulfate HFA (ProAir HFA) 108 (90 Base) MCG/ACT inhaler 1 each 0     Sig: Inhale 2 puffs into the lungs every 4 (four) hours as needed for Wheezing       Discussed results and diagnosis with patient/family.  Reviewed warning signs for worsening condition, as well as, indications for follow-up with primary care physician and return to urgent care clinic.   Patient/family expressed understanding of instructions.     An After Visit Summary was provided to the patient.           [1]   Current Outpatient Medications:     acetaminophen (TYLENOL) 500 MG tablet, Take 1 tablet (500 mg) by mouth every 6 (six) hours as needed, Disp: , Rfl:     albuterol sulfate HFA (PROVENTIL) 108 (90 Base) MCG/ACT inhaler, INHALE TWO PUFFS BY MOUTH EVERY 6 HOURS AS NEEDED FOR WHEEZING OR SHORTNESS OF BREATH, Disp: , Rfl:     allopurinol (ZYLOPRIM) 300 MG tablet, Take 1 tablet (300 mg) by mouth daily, Disp: 90 tablet, Rfl: 3    amLODIPine (NORVASC) 10 MG tablet, Take  1 tablet (10 mg) by mouth daily, Disp: , Rfl:     Ascorbic Acid (vitamin C) 1000 MG tablet, Take 1 tablet (1,000 mg) by mouth daily, Disp: , Rfl:     atorvastatin (LIPITOR) 40 MG tablet, Take 1 tablet (40 mg) by mouth daily, Disp: , Rfl:     B Complex Vitamins (vitamin B complex) Tab, Take 1 tablet by mouth daily, Disp: , Rfl:      cloNIDine (CATAPRES) 0.2 MG tablet, Take 1 tablet (0.2 mg) by mouth 2 (two) times daily, Disp: , Rfl:     Continuous Glucose Receiver (Dexcom G7 Receiver) Device, Use 1 Dose every 10 (ten) days Use as directed as per manufacturer, Disp: 9 each, Rfl: 0    dulaglutide (TRULICITY) 0.75 MG/0.5ML, Inject 0.75 mg into the skin once a week, Disp: 12 each, Rfl: 3    furosemide (LASIX) 20 MG tablet, Take 2 tablets (40 mg) by mouth daily, Disp: 180 tablet, Rfl: 3    glipiZIDE (GLUCOTROL XL) 5 MG 24 hr tablet, Take 1 tablet (5 mg) by mouth daily, Disp: 90 tablet, Rfl: 3    Glucosamine-Chondroit-Vit C-Mn (GLUCOSAMINE 1500 COMPLEX PO), Take 2 tablets by mouth daily, Disp: , Rfl:     glucose blood (OneTouch Ultra) test strip, 1 each by Other route daily Use as instructed, Disp: 100 strip, Rfl: 5    hydrALAZINE (APRESOLINE) 25 MG tablet, Take 1 tablet (25 mg) by mouth as needed Takes as needed, Disp: , Rfl:     isosorbide mononitrate (IMDUR) 30 MG 24 hr tablet, Take 1 tablet (30 mg) by mouth daily, Disp: , Rfl:     Lancets (OneTouch Delica Plus Lancet33G) Misc, Inject 1 Unit into the skin daily USE DAILY AS DIRECTED, Disp: 100 each, Rfl: 5    lisinopril (ZESTRIL) 40 MG tablet, Take 1 tablet (40 mg) by mouth daily, Disp: , Rfl:     metoprolol succinate (TOPROL-XL) 100 MG 24 hr tablet, Take 1 tablet (100 mg) by mouth daily, Disp: 90 tablet, Rfl: 3    Multiple Vitamin (Multi-Vitamin) tablet, Take 1 tablet by mouth daily, Disp: , Rfl:     nitroglycerin (NITROSTAT) 0.4 MG SL tablet, Place 1 tablet (0.4 mg) under the tongue every 5 (five) minutes as needed for Chest pain, Disp: 30 tablet, Rfl: 5    Omega-3 Fatty Acids (fish oil) 1000 MG Cap capsule, Take 1 capsule (1,000 mg) by mouth 2 (two) times daily, Disp: , Rfl:     Vitamin D, Cholecalciferol, 50 MCG (2000 UT) Cap, Take 2,000 Units by mouth daily, Disp: , Rfl:     warfarin (COUMADIN) 5 MG tablet, Take 1.5 tablets (7.5 mg) by mouth every evening, Disp: 135 tablet, Rfl: 2     albuterol sulfate HFA (ProAir HFA) 108 (90 Base) MCG/ACT inhaler, Inhale 2 puffs into the lungs every 4 (four) hours as needed for Wheezing, Disp: 1 each, Rfl: 0    amoxicillin-clavulanate (AUGMENTIN) 875-125 MG per tablet, Take 1 tablet by mouth 2 (two) times daily for 7 days, Disp: 14 tablet, Rfl: 0    benzonatate (TESSALON) 200 MG capsule, Take 1 capsule (200 mg) by mouth 3 (three) times daily as needed for Cough, Disp: 30 capsule, Rfl: 0    metFORMIN (GLUCOPHAGE-XR) 500 MG 24 hr tablet, Take 2 tablets (1,000 mg) by mouth 2 (two) times daily, Disp: 360 tablet, Rfl: 0  [2]   Allergies  Allergen Reactions    Cephalexin Rash   [3]   No current  facility-administered medications for this visit.

## 2023-08-05 NOTE — Patient Instructions (Signed)
You were seen in clinic today and evaluated for cough and congestion.  I suspect you likely have pneumonia.  You have been started on antibiotic however I do recommend that you follow-up with your primary care provider and have a discussion on whether or not you need to be on azithromycin or doxycycline for broader coverage.  You have also been given medication use as needed for cough along with a refill of your inhaler.  If you do develop any new or worsening symptoms, please report to the ER.

## 2023-08-08 ENCOUNTER — Ambulatory Visit (INDEPENDENT_AMBULATORY_CARE_PROVIDER_SITE_OTHER): Payer: Self-pay

## 2023-08-08 DIAGNOSIS — I4892 Unspecified atrial flutter: Secondary | ICD-10-CM

## 2023-08-08 LAB — EXTERNAL PT/INR: PT INR: 3.3 — AB (ref 0.9–1.1)

## 2023-08-08 NOTE — Progress Notes (Signed)
August 08, 2023    INR Goal:  2-3    Warfarin Indication: Paroxysmal AFib/AFlutter     CHA2DS2-VASc Score:  5 HTN (1), Age >/=75 (2), DM (1), and Vascular Dx (1)  HASBLED Score:  1 Elderly (Age>65)    Referring Provider: Dr. Demetrius Revel     Referral Date:  09/06/22    Last visit with referring provider:  09/01/22     PMH:    Past Medical History:   Diagnosis Date    Diabetes mellitus     Hyperlipidemia     Hypertension      Current warfarin dose:    Instructed Dose Reported Dose   Warfarin 5 mg Tues/Thurs/Sun and 7.5 mg all other days    No deviation from instructed dose. dDenies missed or extras dose(s).   7-day warfarin total:  45 mg 7-day warfarin total:  45 mg      Lab Results   Component Value Date    INR 3.3 (A) 08/08/2023    INR 2.7 (A) 08/01/2023    INR 2.6 (A) 07/25/2023    INR 3.6 (A) 07/17/2023    INR 2.6 (A) 07/10/2023    INR 2.7 (A) 07/04/2023     Dietary intake of vitamin K: Inconsistent. Typically 4-5 servings of green beans and salad (Spring mix, carrots, tomatoes, onions) per week. May have 1 serving of high vitamin K vegetables (collard greens) once monthly. Patient is in assisted living facility and eats whatever is served at the facility. Pt reports some extra brussels sprouts on Thanksgiving.      Alcohol consumption: No alcohol consumption since 1989.      Tobacco use: Former, quit in 1989      Medication changes:  Denies new, but dermatologist told him to take OTC pain meds for pain after numbing wears off     New Medication/Dietary/Herbal Interactions: N/A     Concurrent significant interacting medications/supplements:    Medications that may enhance anticoagulant effect or bleeding risks:  APAP (patient takes prn <2000 mg/day)  Allopurinol  Glucosamine  Omega-3 fatty acids  Medications that may diminish anticoagulant effect:  Vitamin C  Metformin  MVI ([employer] brand, vitamin K 25 mcg).     S/sx of bleeding/clotting:  Denies.     Recent Falls: Denies.   Patient uses cane to assist with  mobility.      Upcoming procedures/surgeries: Denies future procedures. Basal cell carcinoma removal went well today without any abnormal bleeding     Is patient a candidate for DOAC?  Yes    If yes, date and outcome of discussion: 10/06/22: Per pharmacy liaison, copay for Eliquis is $771.30/90 days (periodic deductible $545+coinsurance $226.30). Once deductible met, Eliquis would be ~$75/month. Patient has Medicare and not eligible for copay card.  Patient prefers to continue warfarin for now.     Others:   Instructed pt to check INR in the morning M-Th.  INR received after 3pm will be addressed the following business day.  Instructed patient to call [facility] if no call received by 3PM the day after INR testing.     Best Contact:  Best time to reach patient is 8a-3pm.    INR Result:  Lab Results   Component Value Date    INR 3.3 (A) 08/08/2023     via home POCT    =======================================    A/P:  INR 3.3 above therapeutic range without clear cause.   Warfarin 2.5 mg today (12/3), then resume warfarin 5  mg Tues/Thurs/Sun and 7.5 mg all other days (total 42.5 mg over next 7 days).  Recheck INR in 1 week on 08/15/23 via home POCT. Pt prefers Mondays.  Warfarin has been managed per Clinic Protocol    Patient verbalized understanding of instructions provided.  Interview conducted via telephone.    Thank you,    Anson Oregon, PharmD, [facility]  Clinical Pharmacy Specialist, Anticoagulation  Ph: [phone]

## 2023-08-08 NOTE — Telephone Encounter (Signed)
Prescription Refill Checklist    Completed Process Reviewed Notes   [x]  Verified med on pt chart.    [x]  2.  Reviewed patient allergies    [x]  3.  Review last encounters since OV Office Visit with Carl Kida, NP (05/11/2023)    [x]  4.  Determine if pt has f/u as instructed or needs OV    [x]  5.  If OV required, pend 30 day supply of medication    [x]  6.  Review labs are up to date. Up to date   []  7. Filled by RN 90 day x 0 May only be done 1 x yearly   DATE:         Reviewed -  em

## 2023-08-09 ENCOUNTER — Ambulatory Visit (INDEPENDENT_AMBULATORY_CARE_PROVIDER_SITE_OTHER): Payer: Medicare Other | Admitting: Family

## 2023-08-09 ENCOUNTER — Other Ambulatory Visit (INDEPENDENT_AMBULATORY_CARE_PROVIDER_SITE_OTHER): Payer: Medicare Other

## 2023-08-09 ENCOUNTER — Encounter (INDEPENDENT_AMBULATORY_CARE_PROVIDER_SITE_OTHER): Payer: Self-pay | Admitting: Cardiovascular Disease

## 2023-08-09 ENCOUNTER — Ambulatory Visit (INDEPENDENT_AMBULATORY_CARE_PROVIDER_SITE_OTHER): Payer: Medicare Other | Admitting: Cardiovascular Disease

## 2023-08-09 VITALS — BP 158/81 | HR 99 | Temp 97.7°F | Resp 18

## 2023-08-09 VITALS — BP 119/75 | HR 84 | Ht 69.0 in | Wt 198.0 lb

## 2023-08-09 DIAGNOSIS — I152 Hypertension secondary to endocrine disorders: Secondary | ICD-10-CM

## 2023-08-09 DIAGNOSIS — E1169 Type 2 diabetes mellitus with other specified complication: Secondary | ICD-10-CM

## 2023-08-09 DIAGNOSIS — R051 Acute cough: Secondary | ICD-10-CM

## 2023-08-09 DIAGNOSIS — I4892 Unspecified atrial flutter: Secondary | ICD-10-CM

## 2023-08-09 DIAGNOSIS — I480 Paroxysmal atrial fibrillation: Secondary | ICD-10-CM

## 2023-08-09 DIAGNOSIS — R600 Localized edema: Secondary | ICD-10-CM

## 2023-08-09 DIAGNOSIS — E1159 Type 2 diabetes mellitus with other circulatory complications: Secondary | ICD-10-CM

## 2023-08-09 DIAGNOSIS — R6889 Other general symptoms and signs: Secondary | ICD-10-CM

## 2023-08-09 DIAGNOSIS — I5022 Chronic systolic (congestive) heart failure: Secondary | ICD-10-CM

## 2023-08-09 LAB — ECG 12-LEAD
Q-T Interval: 376 ms
QRS Duration: 78 ms
QTC Calculation (Bezet): 470 ms
R Axis: 9 degrees
T Axis: 23 degrees
Ventricular Rate: 94 {beats}/min

## 2023-08-09 LAB — BASIC METABOLIC PANEL
Anion Gap: 9 (ref 5.0–15.0)
BUN: 15 mg/dL (ref 9–28)
CO2: 28 meq/L (ref 17–29)
Calcium: 10.1 mg/dL (ref 7.9–10.2)
Chloride: 106 meq/L (ref 99–111)
Creatinine: 0.9 mg/dL (ref 0.5–1.5)
GFR: >60 mL/min/{1.73_m2} (ref >=60.0)
Glucose: 194 mg/dL — ABNORMAL HIGH (ref 70–100)
Hemolysis Index: 9 {index}
Potassium: 4.1 meq/L (ref 3.5–5.3)
Sodium: 143 meq/L (ref 135–145)

## 2023-08-09 LAB — MAGNESIUM: Magnesium: 1.9 mg/dL (ref 1.6–2.6)

## 2023-08-09 MED ORDER — ISOSORBIDE MONONITRATE ER 30 MG PO TB24
30.0000 mg | ORAL_TABLET | Freq: Every day | ORAL | 3 refills | Status: AC
Start: 2023-08-09 — End: ?

## 2023-08-09 MED ORDER — AMLODIPINE BESYLATE 5 MG PO TABS
5.0000 mg | ORAL_TABLET | Freq: Every day | ORAL | 3 refills | Status: DC
Start: 2023-08-09 — End: 2023-09-20

## 2023-08-09 MED ORDER — ALBUTEROL-IPRATROPIUM 2.5-0.5 (3) MG/3ML IN SOLN
3.0000 mL | Freq: Once | RESPIRATORY_TRACT | Status: AC
Start: 2023-08-09 — End: 2023-08-09
  Administered 2023-08-09: 3 mL via RESPIRATORY_TRACT

## 2023-08-09 MED ORDER — LISINOPRIL 40 MG PO TABS
40.0000 mg | ORAL_TABLET | Freq: Every day | ORAL | 3 refills | Status: DC
Start: 2023-08-09 — End: 2023-09-20

## 2023-08-09 MED ORDER — FUROSEMIDE 20 MG PO TABS
20.0000 mg | ORAL_TABLET | Freq: Every day | ORAL | 3 refills | Status: DC
Start: 2023-08-09 — End: 2023-10-16

## 2023-08-09 MED ORDER — CLONIDINE HCL 0.2 MG PO TABS
0.2000 mg | ORAL_TABLET | Freq: Two times a day (BID) | ORAL | 3 refills | Status: DC
Start: 2023-08-09 — End: 2023-09-20

## 2023-08-09 MED ORDER — ATORVASTATIN CALCIUM 40 MG PO TABS
40.0000 mg | ORAL_TABLET | Freq: Every day | ORAL | 3 refills | Status: DC
Start: 2023-08-09 — End: 2023-10-24

## 2023-08-09 NOTE — Patient Instructions (Addendum)
Dr Carney Living, pulmonary  Reduce lasix to 20 mg daily  Reduce amlodipine from 10 mg to 5 mg daily  Blood work today and than if ok, will start chlorthalidone 12.5 mg daily  Continue other medications the same  Elevate legs   Goal blood pressure less than 130/80      Eating Heart-Healthy Food: Using the DASH Plan    Eating for your heart doesn't have to be hard or boring. You just need to know how to make healthier choices. The DASH eating plan was created to help you do just that. DASH stands for Dietary Approaches to Stop Hypertension. This plan is proven to be healthier for your heart and to lower your risk for high blood pressure. It can also help lower your risk for cancer, stroke, heart disease, osteoporosis, and diabetes.  Choosing from each food group  Choose foods from each of the food groups below each day. Try to get the recommended number of servings for each food group. The serving numbers are based on a diet of 2,000 calories a day. Talk with your healthcare provider if you're not sure about your calorie needs. Along with getting the correct servings, the DASH plan also advises less than 2,300 mg of salt (sodium) per day. Lowering sodium intake to 1,500 mg per day lowers blood pressure even more. (There's about 2,300 mg of sodium in 1 teaspoon of salt.)     Grains  Servings: 6 to 8 a day  A serving is:  1 slice bread  1 ounce dry cereal  Half a cup cooked rice, pasta, or cereal  Best choices: Whole grains and any high-fiber grains Vegetables  Servings: 4 to 5 a day  A serving is:  1 cup raw leafy vegetable  Half a cup cut-up raw or cooked vegetable  Half a cup vegetable juice  Best choices: Fresh or frozen vegetables prepared without added salt or fat   Fruits  Servings: 4 to 5 a day  A serving is:  1 medium fruit  One-quarter cup dried fruit  Half a cup fresh, frozen, or canned fruit  Half a cup of 100% fruit juices  Best choices: A variety of fresh fruits of different colors. Whole fruits are a better  choice than fruit juices. Low-fat or fat-free dairy  Servings: 2 to 3 a day  A serving is:  1 cup milk  1 cup yogurt  One and a half ounces cheese  Best choices: Skim or 1% milk, low-fat or fat-free yogurt or buttermilk, and low-fat cheeses         Lean meats, poultry, fish  Servings: 6 or fewer a day  A serving is:  1 ounce cooked meats, poultry, or fish  1 egg  Best choices: Teacher, English as a foreign language and fish. Trim away visible fat. Broil, grill, roast, or boil instead of frying. Remove skin from poultry before eating. Limit how much red meat you eat.  Nuts, seeds, beans  Servings: 4 to 5 a week  A serving is:  One-third cup nuts (1.5 ounces)  2 tablespoons nut butter or seeds  Half a cup cooked dry beans or legumes  Best choices: Dry roasted nuts with no salt added, lentils, kidney beans, garbanzo beans, and whole pinto beans   Fats and oils  Servings: 2 to 3 a day  A serving is:  1 teaspoon vegetable oil  1 teaspoon soft margarine  1 tablespoon mayonnaise  2 tablespoons salad dressing  Best choices: Nut and  vegetable oils (nontropical vegetable oils), such as olive and canola oil Sweets  Servings: 5 a week or fewer  A serving is:  1 tablespoon sugar, maple syrup, or honey  1 tablespoon jam or jelly  1 half-ounce jelly beans (about 15)  1 cup lemonade  Best choices: Dried fruit can be a satisfying sweet. Choose low-fat sweets. And watch your serving sizes!      For more on the DASH eating plan, visit:  National Heart, Lung, and Blood Institute at [employer]   [employer]Well last reviewed this educational content on 06/05/2020   2000-2023 [employer], [place]. All rights reserved. This information is not intended as a substitute for professional medical care. Always follow your healthcare professional's instructions.  Managing High Blood Pressure with the DASH Diet   What you eat can help lower your blood pressure and reduce your risk for stroke and heart disease.   One such diet, the  Dietary Approaches to Stop Hypertension (DASH) diet, has been shown to reduce blood pressure. This diet is low in saturated fat, cholesterol, and total fat. The diet emphasizes fruits, vegetables, and low-fat dairy products.   Your blood pressure can be unhealthy even if it stays only slightly above 120/80 mm Hg. The higher above that level, the greater your health risk. Over time, high blood pressure makes your heart work too hard. This can cause stroke, heart disease, heart failure, kidney disease, and even blindness.   Blood pressure measurements are given as 2 numbers. Systolic blood pressure is the upper number. This is the pressure when the heart contracts. Diastolic blood pressure is the lower number. This is the pressure when the heart relaxes between beats. Blood pressure is categorized as normal, elevated, or stage 1 or stage 2 high blood pressure:   Normal blood pressure is systolic of less than 120 and diastolic of less than 80 (120/80)  Elevated blood pressure is systolic of 120 to 129 and diastolic less than 80  Stage 1 high blood pressure is systolic of 130 to 139 or diastolic between 80 to 89  Stage 2 high blood pressure is when systolic is 140 or higher or the diastolic is 90 or higher  High blood pressure is diagnosed when multiple, separate readings show blood pressure above 130/80 mmHg. Talk with your healthcare provider if you have questions or concerns about your blood pressure readings.   Why this diet works  Why is the DASH diet so effective at reducing blood pressure? It combines many nutrients that have been shown to help reduce blood pressure. Those nutrients include calcium, potassium, magnesium, protein, and fiber, as well as lower total fat and saturated fat.   The DASH diet is naturally low in salt. The DASH diet recommends menus containing 2,300 mg of sodium. The lower sodium DASH diet contains up to 1,500 mg of sodium a day. (One teaspoon of salt contains 2,300 mg of sodium.)     Further, following the DASH diet may delay your need to take blood pressure lowering medicine, and may even keep you from needing to take it at all. And if you're already on medicine, it may help you reduce the amount you take.   Doing the DASH  The DASH diet is a 2,000-calorie diet that includes:  Six to 8 daily servings of grains and grain products, such as whole-wheat bread, cereal, oatmeal, crackers, unsalted pretzels, and popcorn. A serving size is 1 slice of bread, 1 cup of ready-to-eat cereal, or 1/2-cup of  rice, pasta, or cereal.  Four to 5 daily servings of vegetables, the darker in color, the better. A serving size is 1 cup of raw leafy vegetables, a 1/2-cup of cooked vegetables, or 6 ounces of vegetable juice.  Four to 5 daily servings of fruit. A serving is 1 medium fruit, 1/4-cup of dried fruit, 1/2-cup of fresh, frozen, or canned fruit, or 6 ounces of fruit juice.  Two or 3 daily servings of low-fat or fat-free dairy products. A serving is 8 ounces of milk, 1 cup of yogurt, or 1 ounces of cheese.  Six or fewer daily servings of lean meat, poultry, or fish. A serving is 1 ounce of cooked meats, skinless poultry, or fish.  Four to 5 servings per week of nuts, seeds, and dry beans. A serving is 1/3-cup or 1 ounces of nuts, 1 tablespoon or 1/2-ounce of seeds, or 1/2-cup cooked dried beans.  Two to 3 small daily servings of fats and oils like olive oil and low-fat salad dressing. A serving is 1 teaspoon soft margarine, 1 tablespoon low-fat mayonnaise, 2 tablespoons light salad dressing, or 1 teaspoon vegetable oil. Don't have fats that are saturated (animal fats) or trans fats.  Five or fewer servings per week of sweets like maple syrup, sorbet, or gelatin. A serving is 1 tablespoon sugar, 1 tablespoon jelly or jam, 1/2-ounce jellybeans, or 8 ounces of lemonade.  The DASH diet isn't designed for weight loss, it can promote it if you reduce the number of servings you eat. Most of the food the diet  features is big on volume and low in calories.   Still, there are parts of the DASH diet that may not be easy to follow. For one, it's packed with dark-colored fruits and vegetables. So be prepared to be choosier at [employer]. Also, if it's very different from what you normally eat, it may be hard to adjust.   If you're serious about following the diet, it's a good idea to work with a Museum/gallery exhibitions officer (RD) for support and guidance. (For the names of RDs in your area who know about the DASH diet, visit the Academy of Nutrition and Dietetics at http://www.long-jenkins.com/ .)   Moving forward  If you decide to go it alone, adopt the DASH diet slowly. By doing so, you'll be more likely to stick to it long-term. For instance, add 1 more serving of vegetables at lunch and dinner if you eat only 1 or 2 servings a day now. You might also add fruit to meals and snacks if you now only have juice for breakfast. In addition, slowly increase your dairy products to 3 servings per day. Try drinking skim milk with lunch or dinner, instead of soda, alcohol, or tea.   To get the most out of the DASH, lose weight if you need to and exercise regularly. Thirty to 40 minutes of moderate to vigorous intensity aerobic exercise 4 to 5 days a week is recommended.    More dish on the DASH  The "DASH Eating Plan" is an online guide published by the [employer]. It offers a reader-friendly explanation of high blood pressure, detailed daily servings charts to help you plan your menus, a week of suggested DASH menus, plus tips to reduce sodium.   For more information about diet and other lifestyle factors to reduce hypertension, visit the DASH Eating Plan at [employer].    2000-2023 [employer], [place]. All rights reserved. This information is not intended as a substitute for  professional medical care. Always follow your healthcare professional's instructions.

## 2023-08-09 NOTE — Progress Notes (Signed)
[facility]  URGENT  CARE  PROGRESS NOTE     Patient: Carl [name]   Date: 08/09/2023   MRN: [id number]       Carl [name] is a 82 y.o. male      HISTORY     History obtained from: Patient    Chief Complaint   Patient presents with    Oral Swelling     Throat swelling x10 minutes. Took Benzonatate and it got stuck, felt like his throat was closing.         Pt is here for evaluation of throat swelling. Reports that he has been on tessalon Perles since Friday for cough. He took a dose of Benzonatate 20 mins ago and it got stuck in his throat. He couldn't swallow it or spit it out. Then he started to cough-he used albuterol inhaler and felt like his throat was swelling so he came here. Reports that his symptoms have improved since. No sob or chest tightness. No swelling or itchiness. No wheezing.            Review of Systems    History:    Pertinent Past Medical, Surgical, Family and Social History were reviewed.      Current Medications[1]    Allergies[2]    Medications and Allergies reviewed.    PHYSICAL EXAM     Vitals:    08/09/23 1400   BP: 158/81   Pulse: 99   Resp: 18   Temp: 97.7 F (36.5 C)   TempSrc: Tympanic   SpO2: 97%       Physical Exam  Constitutional:       Appearance: Normal appearance.   HENT:      Mouth/Throat:      Lips: Pink. No lesions.      Mouth: No angioedema.      Pharynx: Oropharynx is clear. Uvula midline. No pharyngeal swelling.   Pulmonary:      Effort: Pulmonary effort is normal.      Breath sounds: Normal breath sounds. No stridor. No wheezing.   Neurological:      Mental Status: He is alert.         UCC COURSE     There were no labs reviewed with this patient during the visit.    There were no x-rays reviewed with this patient during the visit.    Current Inpatient Medications with Last Dose Taken[3]    PROCEDURES     Nebulizer Treatment    Date/Time: 08/09/2023 2:58 PM    Performed by: Cristy Friedlander, FNP  Authorized by: Cristy Friedlander, FNP    Consent:     Consent obtained:   Verbal    Consent given by:  Patient    Alternatives discussed:  No treatment    Indication of treatment:  Acute bronchitis  Pre-Nebulizer:     Disposition:  Stable    O2 saturation:  96    Meds administered and amount:  Atrovent/Ipratropium (Duo-Neb) neb solution  Post-nebulizer:     Disposition:  Patient tolerated well    02 saturation:  96    Patient instructions:  Follow prescription instructions      MEDICAL DECISION MAKING     History, physical, labs/studies most consistent with cough as the diagnosis.        Chart Review:  Prior PCP, Specialist and/or ED notes reviewed today: No  Prior labs/images/studies reviewed today: Yes    Differential Diagnosis: URI, pneumonia, foreign body in throat, bronchitis  ASSESSMENT     Encounter Diagnoses   Name Primary?    Acute cough Yes    Sensation of swollen throat                 PLAN      PLAN: Discussed with pt that the sensation of throat swelling likely due to benzonatate that got stuck and popped in his throat which cause numbness of throat. Pt was monitored for one hour for worsening symptoms and reported that symptoms have resolved after drinking fluids- denies any throat swelling or sob. Pt was given a dose of due-neb to help with cough. Pt has an appt with pcp for evaluation of URI/Pneumonia tomorrow.  Strict emergency department precautions were discussed at length. All questions and concerns were discussed with the patient.               Orders Placed This Encounter   Procedures    Nebulizer Treatment     Requested Prescriptions      No prescriptions requested or ordered in this encounter       Discussed results and diagnosis with patient/family.  Reviewed warning signs for worsening condition, as well as, indications for follow-up with primary care physician and return to urgent care clinic.   Patient/family expressed understanding of instructions.     An After Visit Summary was provided to the patient.           [1]   Current Outpatient Medications:      albuterol sulfate HFA (PROVENTIL) 108 (90 Base) MCG/ACT inhaler, INHALE TWO PUFFS BY MOUTH EVERY 6 HOURS AS NEEDED FOR WHEEZING OR SHORTNESS OF BREATH, Disp: , Rfl:     allopurinol (ZYLOPRIM) 300 MG tablet, Take 1 tablet (300 mg) by mouth daily, Disp: 90 tablet, Rfl: 3    amLODIPine (NORVASC) 5 MG tablet, Take 1 tablet (5 mg) by mouth daily, Disp: 90 tablet, Rfl: 3    amoxicillin-clavulanate (AUGMENTIN) 875-125 MG per tablet, Take 1 tablet by mouth 2 (two) times daily for 7 days, Disp: 14 tablet, Rfl: 0    Ascorbic Acid (vitamin C) 1000 MG tablet, Take 1 tablet (1,000 mg) by mouth daily, Disp: , Rfl:     atorvastatin (LIPITOR) 40 MG tablet, Take 1 tablet (40 mg) by mouth daily, Disp: 90 tablet, Rfl: 3    B Complex Vitamins (vitamin B complex) Tab, Take 1 tablet by mouth daily, Disp: , Rfl:     benzonatate (TESSALON) 200 MG capsule, Take 1 capsule (200 mg) by mouth 3 (three) times daily as needed for Cough, Disp: 30 capsule, Rfl: 0    cloNIDine (CATAPRES) 0.2 MG tablet, Take 1 tablet (0.2 mg) by mouth 2 (two) times daily, Disp: 180 tablet, Rfl: 3    Continuous Glucose Receiver (Dexcom G7 Receiver) Device, Use 1 Dose every 10 (ten) days Use as directed as per manufacturer, Disp: 9 each, Rfl: 0    furosemide (LASIX) 20 MG tablet, Take 1 tablet (20 mg) by mouth daily, Disp: 180 tablet, Rfl: 3    glipiZIDE (GLUCOTROL XL) 5 MG 24 hr tablet, Take 1 tablet (5 mg) by mouth daily, Disp: 90 tablet, Rfl: 3    Glucosamine-Chondroit-Vit C-Mn (GLUCOSAMINE 1500 COMPLEX PO), Take 2 tablets by mouth daily, Disp: , Rfl:     glucose blood (OneTouch Ultra) test strip, 1 each by Other route daily Use as instructed, Disp: 100 strip, Rfl: 5    isosorbide mononitrate (IMDUR) 30 MG 24 hr tablet, Take 1 tablet (30  mg) by mouth daily, Disp: 90 tablet, Rfl: 3    Lancets (OneTouch Delica Plus Lancet33G) Misc, Inject 1 Unit into the skin daily USE DAILY AS DIRECTED, Disp: 100 each, Rfl: 5    lisinopril (ZESTRIL) 40 MG tablet, Take 1 tablet (40  mg) by mouth daily, Disp: 90 tablet, Rfl: 3    metFORMIN (GLUCOPHAGE-XR) 500 MG 24 hr tablet, Take 2 tablets (1,000 mg) by mouth 2 (two) times daily, Disp: 360 tablet, Rfl: 0    metoprolol succinate (TOPROL-XL) 100 MG 24 hr tablet, Take 1 tablet (100 mg) by mouth daily, Disp: 90 tablet, Rfl: 3    Multiple Vitamin (Multi-Vitamin) tablet, Take 1 tablet by mouth daily, Disp: , Rfl:     nitroglycerin (NITROSTAT) 0.4 MG SL tablet, Place 1 tablet (0.4 mg) under the tongue every 5 (five) minutes as needed for Chest pain, Disp: 30 tablet, Rfl: 5    Omega-3 Fatty Acids (fish oil) 1000 MG Cap capsule, Take 1 capsule (1,000 mg) by mouth 2 (two) times daily, Disp: , Rfl:     Vitamin D, Cholecalciferol, 50 MCG (2000 UT) Cap, Take 2,000 Units by mouth daily, Disp: , Rfl:     warfarin (COUMADIN) 5 MG tablet, Take 1.5 tablets (7.5 mg) by mouth every evening, Disp: 135 tablet, Rfl: 2    acetaminophen (TYLENOL) 500 MG tablet, Take 1 tablet (500 mg) by mouth every 6 (six) hours as needed, Disp: , Rfl:     albuterol sulfate HFA (ProAir HFA) 108 (90 Base) MCG/ACT inhaler, Inhale 2 puffs into the lungs every 4 (four) hours as needed for Wheezing, Disp: 1 each, Rfl: 0    clobetasol (TEMOVATE) 0.05 % cream, Apply topically as needed, Disp: , Rfl:     dulaglutide (TRULICITY) 0.75 MG/0.5ML, Inject 0.75 mg into the skin once a week (Patient not taking: Reported on 08/09/2023), Disp: 12 each, Rfl: 3    triamcinolone (KENALOG) 0.1 % cream, Apply topically as needed, Disp: , Rfl:   No current facility-administered medications for this visit.  [2]   Allergies  Allergen Reactions    Cephalexin Rash   [3]   No current facility-administered medications for this visit.

## 2023-08-09 NOTE — Progress Notes (Signed)
[name] Consultation      Referring Physician: Flonnie Overman, [place], MD    I had the pleasure of seeing Carl [name] today for assumption of CV care. [place]  He is a 82 y.o. with a history of PAF.  CABG 2005, had heart burn in chest.     Tums usually relieves his heart burn episgastric.  Last few years sob occasionally at rest, and resolved with inhaler.  No cp or sob with yoga,  Walks with a cane and can walk a block.  No cp or sob walking in from parking lot.  No syncope.  No dizziness.  Rare single skipped beat.  A week ago had heart burn after a meal with hot sauce.  Avg bp 140-150/60's        12/05/22  Increased sob with exertion, one block or flight of stairs; new in last month  One week ago woke up with muscular sensation in chest worse with moving isolated resolved and not reproduced with exertion  No other chest pain  No syncope   No dizzines  No palps    130-145/65-80 on avg  HR 80-85  No bleeding issues    Getting increased notifications that he has AFib; he does not feel it    08/09/23  Still sob with one block or flight of stairs with a cold  Without cold, breathing better    No cp  Has cold and cough with congestion  No orthopnea, no pnd  No syncope  No dizziness  No palps  Does not feels AFib  Apple watch, has had for last 4-5 days with cold  Avg bp 120-123/70-80    Leg swelling resolved when wakes up  Lasix 40 mg did not help with swelling.  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, small MI  No CHF  No CVA  Had DM, HTN, PAF, HLD    PAST SURGICAL HISTORY:   Past Surgical History:   Procedure Laterality Date    CARDIAC SURGERY      TONSILLECTOMY         MEDICATIONS:   Current Outpatient Medications:     acetaminophen (TYLENOL) 500 MG tablet, Take 1 tablet (500 mg) by mouth every 6 (six) hours as needed, Disp: , Rfl:     albuterol sulfate HFA (ProAir  HFA) 108 (90 Base) MCG/ACT inhaler, Inhale 2 puffs into the lungs every 4 (four) hours as needed for Wheezing, Disp: 1 each, Rfl: 0    albuterol sulfate HFA (PROVENTIL) 108 (90 Base) MCG/ACT inhaler, INHALE TWO PUFFS BY MOUTH EVERY 6 HOURS AS NEEDED FOR WHEEZING OR SHORTNESS OF BREATH, Disp: , Rfl:     allopurinol (ZYLOPRIM) 300 MG tablet, Take 1 tablet (300 mg) by mouth daily, Disp: 90 tablet, Rfl: 3    amLODIPine (NORVASC) 10 MG tablet, Take 1 tablet (10 mg) by mouth daily, Disp: , Rfl:     amoxicillin-clavulanate (AUGMENTIN) 875-125 MG per tablet, Take 1 tablet by mouth 2 (two) times daily for 7 days, Disp: 14 tablet, Rfl: 0    Ascorbic Acid (vitamin C) 1000 MG tablet, Take 1 tablet (1,000 mg) by mouth daily, Disp: , Rfl:     atorvastatin (LIPITOR) 40 MG tablet, Take 1 tablet (40 mg) by mouth daily, Disp: , Rfl:  B Complex Vitamins (vitamin B complex) Tab, Take 1 tablet by mouth daily, Disp: , Rfl:     benzonatate (TESSALON) 200 MG capsule, Take 1 capsule (200 mg) by mouth 3 (three) times daily as needed for Cough, Disp: 30 capsule, Rfl: 0    clobetasol (TEMOVATE) 0.05 % cream, Apply topically as needed, Disp: , Rfl:     cloNIDine (CATAPRES) 0.2 MG tablet, Take 1 tablet (0.2 mg) by mouth 2 (two) times daily, Disp: , Rfl:     Continuous Glucose Receiver (Dexcom G7 Receiver) Device, Use 1 Dose every 10 (ten) days Use as directed as per manufacturer, Disp: 9 each, Rfl: 0    furosemide (LASIX) 20 MG tablet, Take 2 tablets (40 mg) by mouth daily, Disp: 180 tablet, Rfl: 3    glipiZIDE (GLUCOTROL XL) 5 MG 24 hr tablet, Take 1 tablet (5 mg) by mouth daily, Disp: 90 tablet, Rfl: 3    Glucosamine-Chondroit-Vit C-Mn (GLUCOSAMINE 1500 COMPLEX PO), Take 2 tablets by mouth daily, Disp: , Rfl:     glucose blood (OneTouch Ultra) test strip, 1 each by Other route daily Use as instructed, Disp: 100 strip, Rfl: 5    isosorbide mononitrate (IMDUR) 30 MG 24 hr tablet, Take 1 tablet (30 mg) by mouth daily, Disp: , Rfl:     Lancets  (OneTouch Delica Plus Lancet33G) Misc, Inject 1 Unit into the skin daily USE DAILY AS DIRECTED, Disp: 100 each, Rfl: 5    lisinopril (ZESTRIL) 40 MG tablet, Take 1 tablet (40 mg) by mouth daily, Disp: , Rfl:     metFORMIN (GLUCOPHAGE-XR) 500 MG 24 hr tablet, Take 2 tablets (1,000 mg) by mouth 2 (two) times daily, Disp: 360 tablet, Rfl: 0    metoprolol succinate (TOPROL-XL) 100 MG 24 hr tablet, Take 1 tablet (100 mg) by mouth daily, Disp: 90 tablet, Rfl: 3    Multiple Vitamin (Multi-Vitamin) tablet, Take 1 tablet by mouth daily, Disp: , Rfl:     nitroglycerin (NITROSTAT) 0.4 MG SL tablet, Place 1 tablet (0.4 mg) under the tongue every 5 (five) minutes as needed for Chest pain, Disp: 30 tablet, Rfl: 5    Omega-3 Fatty Acids (fish oil) 1000 MG Cap capsule, Take 1 capsule (1,000 mg) by mouth 2 (two) times daily, Disp: , Rfl:     triamcinolone (KENALOG) 0.1 % cream, Apply topically as needed, Disp: , Rfl:     Vitamin D, Cholecalciferol, 50 MCG (2000 UT) Cap, Take 2,000 Units by mouth daily, Disp: , Rfl:     warfarin (COUMADIN) 5 MG tablet, Take 1.5 tablets (7.5 mg) by mouth every evening, Disp: 135 tablet, Rfl: 2    dulaglutide (TRULICITY) 0.75 MG/0.5ML, Inject 0.75 mg into the skin once a week (Patient not taking: Reported on 08/09/2023), Disp: 12 each, Rfl: 3    ALLERGIES:   Allergies   Allergen Reactions    Cephalexin Rash        FAMILY HISTORY: His family history includes Cancer in his brother.  Mother had heart disease  SOCIAL HISTORY: He reports that he has quit smoking. His smoking use included cigarettes. He does not have any smokeless tobacco history on file. He reports that he does not currently use alcohol. He reports that he does not currently use drugs.    REVIEW OF SYSTEMS: All other systems reviewed and were negative except as stated above.     PHYSICAL EXAMINATION  General Appearance:  A well-appearing male in no acute distress.    Vital Signs: BP  119/75 (BP Site: Left arm, Patient Position: Sitting, Cuff  Size: Medium)   Pulse 84   Ht 1.753 m (5\' 9" )   Wt 89.8 kg (198 lb)   SpO2 92%   BMI 29.24 kg/m        07/12/2023 08/05/2023 08/09/2023   Weight Monitoring   Height 175.3 cm 175.3 cm 175.3 cm   Weight 96.616 kg 92.987 kg 89.812 kg   BMI (calculated) 31.4 kg/m2 30.3 kg/m2 29.2 kg/m2         Vital Signs: BP 119/75 (BP Site: Left arm, Patient Position: Sitting, Cuff Size: Medium)   Pulse 84   Ht 1.753 m (5\' 9" )   Wt 89.8 kg (198 lb)   SpO2 92%   BMI 29.24 kg/m      General Appearance:  Alert, cooperative, no distress, appears stated age   Head:  Normocephalic, without obvious abnormality, atraumatic   Lungs:   Breathing comfortably without accessory muscle use. Normal respiratory effort.    Eyes:  Sclera anicteric   Musculoskeletal: No apparent gait or station abnormalities    Skin: Normal skin color, texture, no visible rashes or lesions   Neurologic: Alert and oriented x3, normal mood and affect. Grossly intact.    Extremities: 1+ edema L>R. clubbing, or cyanosis. All peripheral pulses are full and equal.        Narrative:      ATRIAL FIBRILLATION  NONSPECIFIC ST ABNORMALITY  ABNORMAL ECG  WHEN COMPARED WITH ECG OF 23-Dec-2022 08:41,  NO SIGNIFICANT CHANGE WAS FOUND             LABS:   Lab Results   Component Value Date    CHOL 144 10/26/2022    LDL 75 10/26/2022    HDL 45 10/26/2022    TRIG 161 10/26/2022    NA 142 02/08/2023    K 4.7 02/08/2023    CL 105 02/08/2023    CO2 31 (H) 02/08/2023    BUN 16.0 02/08/2023    CREAT 1.1 02/08/2023   Interpretation Summary  Show Result ComparisonPredominant rhythm is normal sinus rhythm.   * 18.87 % of Atrial fibrillation/Atrial flutter with longest episode of 1d 7h.     * 9 supraventricular episodes were found. Longest SVT Episode 5 beats     Overall PVC Burden at 0.13 %  Overall PSVC Burden at 2.04 %     There is a total of 0 patient events.  Summary  09/2022    1. There is a small sized area of decreased activity in the mid  inferolateral wall which is mild in  intensity and reversible on rest images.  This is consistent with a small area of ischemia in the territory of the LCx  or RCA.    2. Gated wall motion study demonstrates normal left ventricular function  with calculated ejection fraction >65% at rest and post stress.    3. Frequent unifocal PVCs.    4. No prior studies are available for comparison.    Summary  09/2022    * The left ventricle is normal in size.    * There is normal left ventricular geometry.    * Left ventricular systolic function is normal with an ejection fraction by  Biplane Method of Discs of  67 %.    * Left ventricular segmental wall motion is normal.    * Left ventricular diastolic filling parameters demonstrate normal diastolic  function.    * The right ventricular cavity size is normal  in size.    * Normal right ventricular systolic function.    * The left atrium is mildly dilated.    * There is mild aortic valve sclerosis/fibrocalcific changes vs mild aortic  stenosis.    * No pulmonary hypertension with estimated right ventricular systolic  pressure of  21 mmHg.    * There is moderate mitral annular calcification.    * No prior study is available for comparison.      08/2022  Predominant rhythm is normal sinus rhythm.   * 18.87 % of Atrial fibrillation/Atrial flutter with longest episode of 1d 7h.     * 9 supraventricular episodes were found. Longest SVT Episode 5 beats     Overall PVC Burden at 0.13 %  Overall PSVC Burden at 2.04 %     There is a total of 0 patient events.      Coronary CTA 02/13/2023:   CARDIAC:     1. Status post CABG with a patent LIMA to LAD graft. Distal to the graft,  mixed calcified and noncalcified plaque in the distal LAD results in  moderate (50-69%) stenosis.  2. Calcified plaque in the mid circumflex artery results in mild (25-49%)  stenosis. Left dominant coronary artery system.  3. Patent small caliber RCA without plaque or stenosis.     NON-CARDIAC:     1. Mild emphysema.     Carlynn Spry, MD  02/13/2023  6:07 PM      IMPRESSION:      1.  No superficial venous insufficiency within the bilateral lower  extremities. Expected postoperative changes from partial right GSV  harvesting for bypass grafting.  2.  Patent bilateral lower extremity deep venous system with pulsatile  flow.     Barnie Alderman, DO  02/10/2023 1:48 PM  Echocardiogram 09/22/2022:      Summary    * The left ventricle is normal in size.    * There is normal left ventricular geometry.    * Left ventricular systolic function is normal with an ejection fraction by  Biplane Method of Discs of  67 %.    * Left ventricular segmental wall motion is normal.    * Left ventricular diastolic filling parameters demonstrate normal diastolic  function.    * The right ventricular cavity size is normal in size.    * Normal right ventricular systolic function.    * The left atrium is mildly dilated.    * There is mild aortic valve sclerosis/fibrocalcific changes vs mild aortic  stenosis.    * No pulmonary hypertension with estimated right ventricular systolic  pressure of  21 mmHg.    * There is moderate mitral annular calcification.    * No prior study is available for comparison.  IMPRESSION/RECOMMENDATIONS: Mr. Rosal is a 82 y.o. male     Encounter Diagnoses   Name Primary?    Atrial flutter, unspecified type Yes    Hypertension associated with type 2 diabetes mellitus     PAF (paroxysmal atrial fibrillation)     Chronic systolic congestive heart failure      1. Coronary disease status post CABG in 2005. Vein graft to the OM is known to be occluded. He is asymptomatic on medical therapy. Last Myoview in 2018 without ischemia and normal EF.     2. Atrial fibrillation/flutter- paroxysmal. He is on chronic Coumadin therapy. Rate is well controlled when in Afib. He is asymptomatic.     3. Hyperlipidemia. On statin therapy  and fish oil. Excellent control of LDL    4. HTN. BP is well controlled     5. 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 Last A1c 6.8%.     6. Edema. Chronic.      8. OSA- followed by pulmonary - has not required CPAP yet.    9. 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 Will monitor.      Djon Hartzog presents for cardiology follow up for known cardiac disease including CAD/CABG, PAF, obesity, OSA.  Still has dyspnea on exertion unlikely to be secondary to cardiac etiology with CTA or coronary arteries (02/13/23) showing patent LIMA to LAD and non obstructive CAD.  Leg edema may be secondary to amlodipine 10 mg daily.    Plan:  Dr Carney Living, pulmonary  Reduce lasix to 20 mg daily  Reduce amlodipine from 10 mg to 5 mg daily to improve leg edema  Blood work today and than if ok, will start chlorthalidone 12.5 mg daily to compensate for reduced dose of amlodpine.  Continue other medications the same  Elevate legs   Goal blood pressure less than 130/80  DASH eating plan  NP follow up 2 months

## 2023-08-10 ENCOUNTER — Ambulatory Visit (INDEPENDENT_AMBULATORY_CARE_PROVIDER_SITE_OTHER): Payer: Medicare Other | Admitting: Family Medicine

## 2023-08-10 ENCOUNTER — Encounter (INDEPENDENT_AMBULATORY_CARE_PROVIDER_SITE_OTHER): Payer: Self-pay | Admitting: Family Medicine

## 2023-08-10 ENCOUNTER — Ambulatory Visit
Admission: RE | Admit: 2023-08-10 | Discharge: 2023-08-10 | Disposition: A | Discharge status: Home / Self Care | Payer: Medicare Other | Source: Ambulatory Visit | Attending: Family Medicine | Admitting: Family Medicine

## 2023-08-10 ENCOUNTER — Telehealth (INDEPENDENT_AMBULATORY_CARE_PROVIDER_SITE_OTHER): Payer: Self-pay

## 2023-08-10 VITALS — BP 122/77 | HR 103 | Temp 97.8°F | Resp 18 | Ht 69.0 in | Wt 201.4 lb

## 2023-08-10 DIAGNOSIS — J189 Pneumonia, unspecified organism: Secondary | ICD-10-CM | POA: Insufficient documentation

## 2023-08-10 DIAGNOSIS — Z79899 Other long term (current) drug therapy: Secondary | ICD-10-CM

## 2023-08-10 DIAGNOSIS — E1169 Type 2 diabetes mellitus with other specified complication: Secondary | ICD-10-CM

## 2023-08-10 MED ORDER — CHLORTHALIDONE 25 MG PO TABS
12.5000 mg | ORAL_TABLET | Freq: Every day | ORAL | 3 refills | Status: DC
Start: 2023-08-10 — End: 2023-10-17

## 2023-08-10 MED ORDER — METFORMIN HCL ER 500 MG PO TB24
1000.0000 mg | ORAL_TABLET | Freq: Two times a day (BID) | ORAL | 0 refills | Status: DC
Start: 2023-08-10 — End: 2023-11-08

## 2023-08-10 NOTE — Progress Notes (Signed)
Have you seen any specialists since your last visit with [place]?  Yes, dermatologist, cardiologist       The patient was informed that the following HM items are still outstanding:   eye exam and medicare wellness, covid booster, urine microalbumin, advance directive,

## 2023-08-10 NOTE — Progress Notes (Signed)
[name]                       Date of Exam: 08/10/2023 9:25 AM        Patient ID: Carl [name] is a 82 y.o. male.  Attending Physician: Elinor Dodge, MD        Chief Complaint:    Chief Complaint   Patient presents with    Pneumonia     UC follow up 08/05/23    Cough    Medication Review    Diabetes Follow-up               HPI:    HPI  Her with son.   36 yr smokeing.   1 week ago started with cough/chest congestion, mucus, some sob, no fever.    Went to UC on Sat breathiing heavier and difficulty getting full breath and neg covid/flu/strep neg.  Dx'd with pneumonia . Started augmentin. On the last dose today. Was not able to chest xray.  Still cough but less, breathing better but still have rattling in chest.     No med for mucus now but taking benzonate for cough. Went to UC again yesterday because trouble swallowing benzonate/throat swelling. Did neb in UC. No steroid. Swallowing normal.     Uses inhaler/ typically twice a week/not needed it more.     2 days ago skin cancer resection / left eye bruising due to resection.     Has appt with pulm next Tues.     Saw Dr. Mervyn Skeeters, prescribed trulicity. Too expensive and has not started.                      Problem List:    Problem List[1]          Current Meds:    Medications Taking[2]       Allergies:    Allergies[3]          Past Surgical History:    Past Surgical History[4]        Family History:    Family History[5]        Social History:    Social History[6]        The following sections were reviewed this encounter by the provider:   Tobacco  Allergies  Meds  Problems  Med Hx  Surg Hx  Fam Hx             Vital Signs:    BP 122/77 (BP Site: Left arm, Patient Position: Sitting, Cuff Size: Large)   Pulse (!) 103   Temp 97.8 F (36.6 C) (Oral)   Resp 18   Ht 1.753 m (5\' 9" )   Wt 91.4 kg (201 lb 6.4 oz)   SpO2 95%   BMI 29.74 kg/m    Wt Readings from Last 3 Encounters:   08/10/23 91.4 kg (201 lb 6.4 oz)   08/09/23 89.8 kg  (198 lb)   08/05/23 93 kg (205 lb)           ROS:    Review of Systems           Physical Exam:    Physical Exam  Vitals and nursing note reviewed.   Constitutional:       General: He is not in acute distress.     Appearance: Normal appearance. He is not ill-appearing or toxic-appearing.   HENT:      Head: Normocephalic.  Right Ear: Tympanic membrane, ear canal and external ear normal.      Left Ear: Tympanic membrane, ear canal and external ear normal.      Nose: No congestion or rhinorrhea.      Mouth/Throat:      Mouth: Mucous membranes are moist.      Pharynx: No oropharyngeal exudate or posterior oropharyngeal erythema.   Eyes:      General: No scleral icterus.        Right eye: No discharge.         Left eye: No discharge.      Extraocular Movements: Extraocular movements intact.      Conjunctiva/sclera: Conjunctivae normal.      Pupils: Pupils are equal, round, and reactive to light.   Cardiovascular:      Rate and Rhythm: Rhythm irregular.      Heart sounds: Murmur heard.   Pulmonary:      Effort: Pulmonary effort is normal. No respiratory distress.      Breath sounds: No wheezing.      Comments: Diffuse crackles B.   Musculoskeletal:      Cervical back: Neck supple. No rigidity.   Lymphadenopathy:      Cervical: Cervical adenopathy present.   Neurological:      General: No focal deficit present.      Mental Status: He is alert and oriented to person, place, and time.   Psychiatric:         Mood and Affect: Mood normal.         Behavior: Behavior normal.                Assessment:      1. Pneumonia due to infectious organism, unspecified laterality, unspecified part of lung  - X-ray chest PA and lateral; Future  Reviewed UC notes  Agreed likely resolving pneumonia  Unclear if lung exam chronic.   Agreed will do xray.   Keep pulm appt  Return if sob or cough worse  Stop tessalon.  Use mucinex DM or robutussin DM for mucus and cough  Do not sleep flat  If breathing worse, may need to go to ER    2. Type 2  diabetes mellitus with other specified complication, without long-term current use of insulin  - metFORMIN (GLUCOPHAGE-XR) 500 MG 24 hr tablet; Take 2 tablets (1,000 mg) by mouth 2 (two) times daily  Dispense: 360 tablet; Refill: 0  Lab Results   Component Value Date    HGBA1C 7.3 (A) 07/12/2023    HGBA1C 7.2 (A) 04/07/2023    HGBA1C 6.8 (H) 01/18/2023      No results found for: "MICROALB"    Trulicity not started per pt due to cost  Using metformin and glipizide.   A1C reviewed- close to goal without trulicity  Refilled med.   Keep follow up in 12/17 to repeat test. And consider options.   Advised glucose may be elevated when sick    Recommend low carb diet and aerobic exercise measures.    Reduce simple carbs: rice, flour, pasta, noodle, bread, sugar, potato, alcohol, etc    Eat lots vegetables, healthy protein and healthy fat.    Recommend daily exercise:  - Stretching exercises daily & Aerobic/cardio exercise: 30-60 minutes, 5 times weekly  - Weight-bearing/resistance exercise (all major muscles - including shoulders, biceps, triceps, chest, abs, quads and hamstrings): 3 times weekly.  Follow-up:     No follow-ups on file.     Deaisha Welborn Frederic Jericho, MD               [1]   Patient Active Problem List  Diagnosis    Arteriosclerosis of coronary artery    Atrial flutter    Type 2 diabetes mellitus with other specified complication    Hyperlipidemia associated with type 2 diabetes mellitus    Hypertension associated with type 2 diabetes mellitus    Neuropathy due to type 2 diabetes mellitus    Obesity (BMI 30-39.9)    OSA (obstructive sleep apnea)    Subjective tinnitus of both ears    Melanoma in situ of face    Arthritis of both knees    Bilateral hip joint arthritis    Intrinsic eczema    Chronic cough    Chronic systolic congestive heart failure   [2]   Outpatient Medications Marked as Taking for the 08/10/23 encounter (Office Visit) with Elinor Dodge, MD   Medication Sig Dispense Refill    acetaminophen  (TYLENOL) 500 MG tablet Take 1 tablet (500 mg) by mouth every 6 (six) hours as needed      albuterol sulfate HFA (ProAir HFA) 108 (90 Base) MCG/ACT inhaler Inhale 2 puffs into the lungs every 4 (four) hours as needed for Wheezing 1 each 0    albuterol sulfate HFA (PROVENTIL) 108 (90 Base) MCG/ACT inhaler INHALE TWO PUFFS BY MOUTH EVERY 6 HOURS AS NEEDED FOR WHEEZING OR SHORTNESS OF BREATH      allopurinol (ZYLOPRIM) 300 MG tablet Take 1 tablet (300 mg) by mouth daily 90 tablet 3    amLODIPine (NORVASC) 5 MG tablet Take 1 tablet (5 mg) by mouth daily 90 tablet 3    amoxicillin-clavulanate (AUGMENTIN) 875-125 MG per tablet Take 1 tablet by mouth 2 (two) times daily for 7 days 14 tablet 0    Ascorbic Acid (vitamin C) 1000 MG tablet Take 1 tablet (1,000 mg) by mouth daily      atorvastatin (LIPITOR) 40 MG tablet Take 1 tablet (40 mg) by mouth daily 90 tablet 3    B Complex Vitamins (vitamin B complex) Tab Take 1 tablet by mouth daily      benzonatate (TESSALON) 200 MG capsule Take 1 capsule (200 mg) by mouth 3 (three) times daily as needed for Cough 30 capsule 0    clobetasol (TEMOVATE) 0.05 % cream Apply topically as needed      cloNIDine (CATAPRES) 0.2 MG tablet Take 1 tablet (0.2 mg) by mouth 2 (two) times daily 180 tablet 3    dulaglutide (TRULICITY) 0.75 MG/0.5ML Inject 0.75 mg into the skin once a week 12 each 3    furosemide (LASIX) 20 MG tablet Take 1 tablet (20 mg) by mouth daily 180 tablet 3    glipiZIDE (GLUCOTROL XL) 5 MG 24 hr tablet Take 1 tablet (5 mg) by mouth daily 90 tablet 3    Glucosamine-Chondroit-Vit C-Mn (GLUCOSAMINE 1500 COMPLEX PO) Take 2 tablets by mouth daily      glucose blood (OneTouch Ultra) test strip 1 each by Other route daily Use as instructed 100 strip 5    isosorbide mononitrate (IMDUR) 30 MG 24 hr tablet Take 1 tablet (30 mg) by mouth daily 90 tablet 3    Lancets (OneTouch Delica Plus Lancet33G) Misc Inject 1 Unit into the skin daily USE DAILY AS DIRECTED 100 each 5    lisinopril  (ZESTRIL) 40 MG tablet Take 1  tablet (40 mg) by mouth daily 90 tablet 3    metoprolol succinate (TOPROL-XL) 100 MG 24 hr tablet Take 1 tablet (100 mg) by mouth daily 90 tablet 3    Multiple Vitamin (Multi-Vitamin) tablet Take 1 tablet by mouth daily      nitroglycerin (NITROSTAT) 0.4 MG SL tablet Place 1 tablet (0.4 mg) under the tongue every 5 (five) minutes as needed for Chest pain 30 tablet 5    Omega-3 Fatty Acids (fish oil) 1000 MG Cap capsule Take 1 capsule (1,000 mg) by mouth 2 (two) times daily      triamcinolone (KENALOG) 0.1 % cream Apply topically as needed      Vitamin D, Cholecalciferol, 50 MCG (2000 UT) Cap Take 2,000 Units by mouth daily      warfarin (COUMADIN) 5 MG tablet Take 1.5 tablets (7.5 mg) by mouth every evening 135 tablet 2     Current Facility-Administered Medications for the 08/10/23 encounter (Office Visit) with Elinor Dodge, MD   Medication Dose Route Frequency Provider Last Rate Last Admin    [COMPLETED] albuterol-ipratropium (DUO-NEB) 2.5-0.5(3) mg/3 mL nebulizer 3 mL  3 mL Nebulization Once    3 mL at 08/09/23 1446   [3]   Allergies  Allergen Reactions    Cephalexin Rash   [4]   Past Surgical History:  Procedure Laterality Date    CARDIAC SURGERY      SKIN BIOPSY      TONSILLECTOMY     [5]   Family History  Problem Relation Name Age of Onset    Lung cancer Brother     [6]   Social History  Tobacco Use    Smoking status: Former     Types: Cigarettes    Tobacco comments:     Quit smoking June 1989, smoked 30 years before   Vaping Use    Vaping status: Never Used   Substance Use Topics    Alcohol use: Not Currently    Drug use: Not Currently

## 2023-08-10 NOTE — Telephone Encounter (Addendum)
-----   Message from Urban Gibson sent at 08/09/2023  5:43 PM EST -----  Start chlorthalidone 12.5 mg daily, repeat bmp mg one week    Pt aware

## 2023-08-14 ENCOUNTER — Other Ambulatory Visit (INDEPENDENT_AMBULATORY_CARE_PROVIDER_SITE_OTHER): Payer: Self-pay | Admitting: Family Medicine

## 2023-08-14 MED ORDER — AMOXICILLIN-POT CLAVULANATE 875-125 MG PO TABS
1.0000 | ORAL_TABLET | Freq: Two times a day (BID) | ORAL | 0 refills | Status: AC
Start: 2023-08-14 — End: 2023-08-24

## 2023-08-15 ENCOUNTER — Ambulatory Visit (INDEPENDENT_AMBULATORY_CARE_PROVIDER_SITE_OTHER): Payer: Self-pay

## 2023-08-15 LAB — EXTERNAL PT/INR: PT INR: 6.7 — AB (ref 0.9–1.1)

## 2023-08-15 NOTE — Progress Notes (Signed)
August 15, 2023    INR Goal:  2-3    Warfarin Indication: Paroxysmal AFib/AFlutter     CHA2DS2-VASc Score:  5 HTN (1), Age >/=75 (2), DM (1), and Vascular Dx (1)  HASBLED Score:  1 Elderly (Age>65)    Referring Provider: Dr. Demetrius Revel     Referral Date:  09/06/22    Last visit with referring provider:  09/01/22     PMH:    Past Medical History:   Diagnosis Date    Diabetes mellitus     Hyperlipidemia     Hypertension      Current warfarin dose:    Instructed Dose Reported Dose   Warfarin 5 mg Tues/Thurs/Sun and 7.5 mg all other days   Warfarin 2.5 mg on 12/3 for INR 3.3    No deviation from instructed dose. dDenies missed or extras dose(s).   7-day warfarin total:  42.5 mg 7-day warfarin total:  42.5 mg      Lab Results   Component Value Date    INR 3.3 (A) 08/08/2023    INR 2.7 (A) 08/01/2023    INR 2.6 (A) 07/25/2023    INR 3.6 (A) 07/17/2023    INR 2.6 (A) 07/10/2023    INR 2.7 (A) 07/04/2023     Dietary intake of vitamin K: Inconsistent. Typically 4-5 servings of green beans and salad (Spring mix, carrots, tomatoes, onions) per week. May have 1 serving of high vitamin K vegetables (collard greens) once monthly. Patient is in assisted living facility and eats whatever is served at the facility. Reports decreased vegetable intake due to PNA.      Alcohol consumption: No alcohol consumption since 1989.      Tobacco use: Former, quit in 1989      Medication changes:    Augmentin 875/125 1 tab BID (12/1-12/17) for PNA.  Pt reports initially prescribed for 7 days, and extended for another 10 days due to CXR showing presence of PNA after initial course.   Reports to taking APAP ES 500mg  1 tab BID for a few days last week, but stopped taking it this week.   Benzonatate for cough      New Medication/Dietary/Herbal Interactions:   Amox/Cla may enhance the anticoagulant effect or bleeding risk of warfarin.  Pt no longer taking APAP, previously APAP 2000mg /day.  Benzonatate does not interact with warfarin       Concurrent significant interacting medications/supplements:    Medications that may enhance anticoagulant effect or bleeding risks:  APAP (patient takes prn <2000 mg/day)  Allopurinol  Glucosamine  Omega-3 fatty acids  Medications that may diminish anticoagulant effect:  Vitamin C  Metformin  MVI ([facility] brand, vitamin K 25 mcg).     S/sx of bleeding/clotting:    Denies red or dark brown urine, red or black, tarry stool, vomiting or coughing up blood, unexplained bruising, bleeding that doesn't stop or is very heavy.   Denies sudden weakness in an arm or leg, facial drooping, numbness/tingling anywhere, vision changes or loss of sight in either eye, sudden painful headache, slurred speech or not being able to speak.     Recent Falls: Denies.   Patient uses cane to assist with mobility.      Upcoming procedures/surgeries: Denies      Is patient a candidate for DOAC?  Yes    If yes, date and outcome of discussion: 10/06/22: Per pharmacy liaison, copay for Eliquis is $771.30/90 days (periodic deductible $545+coinsurance $226.30). Once deductible met, Eliquis would be ~$  75/month. Patient has Medicare and not eligible for copay card.  Patient prefers to continue warfarin for now.     Others: PNA with chest congestion and productive cough.  Denies fever and diarrhea.   Instructed pt to check INR in the morning M-Th.  INR received after 3pm will be addressed the following business day.  Instructed patient to call [facility] if no call received by 3PM the day after INR testing.     Best Contact:  Best time to reach patient is 8a-3pm.    INR Result:  Lab Results   Component Value Date    INR 6.7 (A) 08/15/2023     via home POCT Pt reports initial reading was 7.0, and second attempt resulted 6.7.    =======================================    A/P:  INR 6.7 above therapeutic range likely due to a combination of factors to include infection (PNA), decreased vit K in diet, and DDI with abx.   Hold warfarin today  (08/14/23)  Obtain confirmatory INR for INR >4.5 per protocol. Pt will recheck INR tomorrow via home POCT.  Denies red or dark brown urine, red or black, tarry stool, vomiting or coughing up blood, unexplained bruising, bleeding that doesn't stop or is very heavy.   Counseled on increased risk of bleeding with elevated INR and monitor for s/sx of unusual bleeding/bruising.  Seek immediate medical attention if red or dark brown urine, red or black, tarry stool, vomiting or coughing up blood, unexplained bruising, bleeding that doesn't stop or is very heavy, or s/sx of stroke.   Warfarin has been managed per Clinic Protocol    Patient verbalized understanding of instructions provided.  Interview conducted via telephone.    Burnell Blanks, PharmD, BCPS  Clinical Pharmacy Specialist-Anticoagulation

## 2023-08-15 NOTE — Progress Notes (Unsigned)
[facility]      Patient Name: Carl [name]    Date of Visit: 08/16/2023   Date of Birth: [birth year]  AGE: 82 y.o.  Medical Record #: [id number]  Requesting Physician: MHD [name], MD    HISTORY OF PRESENT ILLNESS:  Initial History 08/16/2023:  Carl [name]  is a 82 y.o.  male with a PMHX of dyspnea on exertion, emphysema, former tobacco use, OSA, afib, HTN, HLD, s/p CABG in 2005, type 2 diabetes, and GERD. He presents today for evaluation and management of his COPD and cough. He is accompanied by his son, Carl [name], today.     Pt was seen at [name] on 08/05/2023 for dyspnea, cough, and chest congestion x 3 days. He was wheezing on exam. COVID, flu, and strep tests were all negative and he was started on Tessalon perles and a course of Augmentin for suspected pneumonia. He was advised to follow up soon with his PCP.     He was seen again at [facility] urgent care on 08/09/2023 after dysphagia with Tessalon perles and a feeling of his throat closing up. He had been unable to either swallow or spit out a Tesslaon perle at home and started to cough. He used his albuterol inhaler and then started to feel like his throat was swelling. His symptoms improved by the time he was seen at UC (20-30 minutes later). He was advised that the sensation of throat swelling was likely due to the benzonatate popping when it got stuck in his throat and causing numbness. He was monitored for one hour and given a DuoNeb to help with his cough. His symptoms resolved after drinking some water.     His PCP saw him on 08/10/2023 and his coughing had improved on Augmentin. He was advised to stop Tessalon perles and use Mucinex DM or Robitussin DM for mucous and cough. A CXR was ordered, which was done that day and showed a new lingular infiltrate. He was told to complete the Augmentin as prescribed. His symptoms persisted after the course of abx and he was prescribed another course of Augmentin on  08/14/2023.     His cough has improved somewhat but is still productive of intermittant green phlegm. He had multiple +sick contacts just before Thanksgiving, one of whom was treated with abx. His son managed his symptoms with OTC medications but notes that his cough was fairly severe.     Before his recent URI, he had been experiencing a baseline level of shortness of breath for a while. His son has noticed that the patient is more winded with exertion now than he used to be. He previously followed with a pulmonologist in [place], [place]. He does not remember what the official diagnosis was, but he was given albuterol to use as a rescue inhaler. He was also diagnosed with OSA in the past and thinks they talked about CPAP but he did not end up starting PAP therapy.    He has daily edema of BLE, which goes down at nighttime. Increased Lasix only caused frequent urination but did not seem to make a difference in the edema. His amlodipine dose was just decreased about a week ago and he was started on chlorthalidone.    He had skin cancer resection on the left side of his face on 08/08/2023.    He is a former smoker, about 2 ppd or more x 20 years.    MEDICATIONS:    Current Outpatient Medications:  acetaminophen (TYLENOL) 500 MG tablet, Take 1 tablet (500 mg) by mouth every 6 (six) hours as needed, Disp: , Rfl:     albuterol sulfate HFA (ProAir HFA) 108 (90 Base) MCG/ACT inhaler, Inhale 2 puffs into the lungs every 4 (four) hours as needed for Wheezing, Disp: 1 each, Rfl: 0    allopurinol (ZYLOPRIM) 300 MG tablet, Take 1 tablet (300 mg) by mouth daily, Disp: 90 tablet, Rfl: 3    amLODIPine (NORVASC) 5 MG tablet, Take 1 tablet (5 mg) by mouth daily, Disp: 90 tablet, Rfl: 3    amoxicillin-clavulanate (AUGMENTIN) 875-125 MG per tablet, Take 1 tablet by mouth 2 (two) times daily for 10 days, Disp: 20 tablet, Rfl: 0    Ascorbic Acid (vitamin C) 1000 MG tablet, Take 1 tablet (1,000 mg) by mouth daily, Disp: , Rfl:      atorvastatin (LIPITOR) 40 MG tablet, Take 1 tablet (40 mg) by mouth daily, Disp: 90 tablet, Rfl: 3    B Complex Vitamins (vitamin B complex) Tab, Take 1 tablet by mouth daily, Disp: , Rfl:     benzonatate (TESSALON) 200 MG capsule, Take 1 capsule (200 mg) by mouth 3 (three) times daily as needed for Cough, Disp: 30 capsule, Rfl: 0    chlorthalidone 25 MG tablet, Take 0.5 tablets (12.5 mg) by mouth daily, Disp: 45 tablet, Rfl: 3    clobetasol (TEMOVATE) 0.05 % cream, Apply topically as needed, Disp: , Rfl:     cloNIDine (CATAPRES) 0.2 MG tablet, Take 1 tablet (0.2 mg) by mouth 2 (two) times daily, Disp: 180 tablet, Rfl: 3    Continuous Glucose Receiver (Dexcom G7 Receiver) Device, Use 1 Dose every 10 (ten) days Use as directed as per manufacturer, Disp: 9 each, Rfl: 0    furosemide (LASIX) 20 MG tablet, Take 1 tablet (20 mg) by mouth daily, Disp: 180 tablet, Rfl: 3    glipiZIDE (GLUCOTROL XL) 5 MG 24 hr tablet, Take 1 tablet (5 mg) by mouth daily, Disp: 90 tablet, Rfl: 3    Glucosamine-Chondroit-Vit C-Mn (GLUCOSAMINE 1500 COMPLEX PO), Take 2 tablets by mouth daily, Disp: , Rfl:     isosorbide mononitrate (IMDUR) 30 MG 24 hr tablet, Take 1 tablet (30 mg) by mouth daily, Disp: 90 tablet, Rfl: 3    lisinopril (ZESTRIL) 40 MG tablet, Take 1 tablet (40 mg) by mouth daily, Disp: 90 tablet, Rfl: 3    metFORMIN (GLUCOPHAGE-XR) 500 MG 24 hr tablet, Take 2 tablets (1,000 mg) by mouth 2 (two) times daily, Disp: 360 tablet, Rfl: 0    metoprolol succinate (TOPROL-XL) 100 MG 24 hr tablet, Take 1 tablet (100 mg) by mouth daily, Disp: 90 tablet, Rfl: 3    Multiple Vitamin (Multi-Vitamin) tablet, Take 1 tablet by mouth daily, Disp: , Rfl:     nitroglycerin (NITROSTAT) 0.4 MG SL tablet, Place 1 tablet (0.4 mg) under the tongue every 5 (five) minutes as needed for Chest pain, Disp: 30 tablet, Rfl: 5    Omega-3 Fatty Acids (fish oil) 1000 MG Cap capsule, Take 1 capsule (1,000 mg) by mouth 2 (two) times daily, Disp: , Rfl:      triamcinolone (KENALOG) 0.1 % cream, Apply topically as needed, Disp: , Rfl:     Vitamin D, Cholecalciferol, 50 MCG (2000 UT) Cap, Take 2,000 Units by mouth daily, Disp: , Rfl:     warfarin (COUMADIN) 5 MG tablet, Take 1.5 tablets (7.5 mg) by mouth every evening, Disp: 135 tablet, Rfl: 2    albuterol  sulfate HFA (PROVENTIL) 108 (90 Base) MCG/ACT inhaler, INHALE TWO PUFFS BY MOUTH EVERY 6 HOURS AS NEEDED FOR WHEEZING OR SHORTNESS OF BREATH (Patient not taking: Reported on 08/16/2023), Disp: , Rfl:     dulaglutide (TRULICITY) 0.75 MG/0.5ML, Inject 0.75 mg into the skin once a week (Patient not taking: Reported on 08/16/2023), Disp: 12 each, Rfl: 3    fluticasone-umeclidinium-vilanterol (TRELEGY) 200-62.5-25 MCG/ACT Aerosol Pwdr, Breath Activated, Inhale 1 puff into the lungs daily, Disp: 3 each, Rfl: 3    glucose blood (OneTouch Ultra) test strip, 1 each by Other route daily Use as instructed (Patient not taking: Reported on 08/16/2023), Disp: 100 strip, Rfl: 5    Lancets (OneTouch Delica Plus Lancet33G) Misc, Inject 1 Unit into the skin daily USE DAILY AS DIRECTED (Patient not taking: Reported on 08/16/2023), Disp: 100 each, Rfl: 5      Vitals:    08/16/23 0851   BP: 126/68   Pulse: (!) 101   Resp: 20   Temp: 98.3 F (36.8 C)   SpO2: 93%    On Room air   Physical Exam  HENT:      Head: Normocephalic.      Nose: Septal deviation (to the right) present.      Mouth/Throat:      Comments: Mallampati III  Eyes:      Extraocular Movements: Extraocular movements intact.      Pupils: Pupils are equal, round, and reactive to light.   Cardiovascular:      Rate and Rhythm: Normal rate and regular rhythm.   Pulmonary:      Effort: Pulmonary effort is normal.      Breath sounds: Normal breath sounds. Decreased air movement present.   Musculoskeletal:      Cervical back: Normal range of motion and neck supple.      Right lower leg: Edema present.      Left lower leg: Edema present.      Comments: BLE edema, L>R   Neurological:       General: No focal deficit present.      Mental Status: He is alert and oriented to person, place, and time.   Psychiatric:         Mood and Affect: Mood normal.         Behavior: Behavior normal.         PULMONARY DIAGNOSTICS:  Spirometry on 08/16/2023: FVC 2.05 (63%), FEV1 1.16 (47%), FEV1/FVC 57%-- moderate  obstruction  Spirometry on 08/08/2018 at [facility]: FVC pre 2.45 (68%), post 2.70 (75%); FEV1 pre 1.60 (62%), post 1.77 (69%); FEV1/FVC pre 65%, post 66%-- moderate obstruction with mild improvement after administration of bronchodilator       LABS:  Lab Results   Component Value Date    WBC 10.69 (H) 12/23/2022    HCT 41.4 12/23/2022    HGB 14.1 12/23/2022    PLT 231 12/23/2022    ABSEOSAUTOMA 0.14 12/23/2022    ABSEOSAUTOMA 0.11 09/01/2022    ABSEOSAUTOMA 0.25 07/20/2022     Lab Results   Component Value Date    GLU 194 (H) 08/09/2023    ALKPHOS 82 10/26/2022    AST 26 10/26/2022    ALT 25 10/26/2022    CREAT 0.9 08/09/2023    CO2 28 08/09/2023        IMAGING:  CXR done on 08/10/2023 was personally reviewed and demonstrates: New moderate lingular infiltrate.     CTA coronary done on 02/13/2023 was personally reviewed and demonstrates: Status  post CABG with a patent LIMA to LAD graft. Distal to the graft, mixed calcified and noncalcified plaque in the distal LAD results in moderate (50-69%) stenosis. Calcified plaque in the mid circumflex artery results in mild (25-49%) stenosis. Left dominant coronary artery system. Patent small caliber RCA without plaque or stenosis. Mild emphysema.     SLEEP DATA:  Split-night sleep study done on 11/26/2018 was personally reviewed and demonstrates: An AHI of 38.6 events/hour. An RDI of 60.0 events/hour. The oxygen saturation nadir was 86%. Diagnosis: Severe sleep apnea and the second portion of the study (CPAP titration) was unable to be completed due to patient's insomnia. AutoCPAP was recommended    Overnight home sleep study done on 12/07/2017 was personally  reviewed and demonstrates: An AHI of 12.2 events/hour. The oxygen saturation nadir was 79%. Diagnosis: Mild sleep apnea       IMMUNIZATIONS:      IMPRESSION:  Mr. Pearsall is a 83 y.o. male with the following problems:    Dyspnea on exertion  Likely related to COPD and some level of deconditioning  Currently recovering from PNA with likely elements of COPD flare    Emphysema/COPD  >40 pack years  His in-office spirometry shows moderate obstruction with an FEV1 of 47% predicted. This is down compared to his spirometry done in [place] on 08/08/2018, which showed an FEV1 of 62% predicted, likely due to his recent pneumonia.   Anticipate getting baseline PFTs and once he is feeling better.   He would benefit from starting Trelegy for better management of his COPD.     Pneumonia   Exam at [facility] on 08/05/2023 revealed wheezing. He was started on Augmentin for suspected pneumonia.   COVID, flu, strep negative  CXR done on 08/10/2023 showed a new moderate lingular infiltrate.   Symptoms persisted after initial course of abx. He was given a second course of Augmentin on 12/09 and has taken 2 days of it so far. He should complete it as prescribed.   Symptoms improving, although his cough persists. If cough remains productive, can check sputum cultures  Will plan to get a repeat CXR in a few weeks to ensure resolution of pneumonia    OSA  Severe per split-night study done in 2020.   Not on CPAP  We discussed that he should be on CPAP therapy, given the severity of his OSA; however, he is very hesitant to start CPAP. He would likely need an updated sleep study to re-evaluate his sleep apnea before starting treatment - he is amenable to this.     Former smoker   >40 pack years    RECOMMENDATIONS:  Order for an overnight inlab split night sleep study to re-evaluate his OSA  Pt is advised to contact the [facility] if he does not hear from them in the next 5-7 days  Anticipate initiation of CPAP if positive for sleep  apnea.  Complete Augmentin course  Check sputum cultures if cough remains productive  Orders provided for bacterial, fungal, and AFB cultures  Get CXR done in a few weeks  Order provided today   Start Trelegy Ellipta 1 puff QD  Pt advised to rinse and spit after using to prevent thrush  Continue albuterol HFA 2 puffs every 4-6 hours prn for shortness of breath, wheezing, chest tightness  Return in about 2 months (around 10/17/2023).   Pt is advised to call sooner if any worsening respiratory issues arise.  Orders Placed This Encounter   Procedures    Culture, Sputum and Lower Respiratory    Culture and Smear, Fungal (Order)    Culture and Smear, Acid Fast Bacilli (AFB/Mycobacteria)(Order)    X-ray chest PA and lateral    Spirometry    PSG Split Night Study       A total of 60 minutes was spent on this visit reviewing previous notes, reviewing imaging, counseling the patient and/or family members regarding the patient's condition(s), ordering of tests, managing medications, and documenting the findings in the note.    I personally scribed for Donzetta Matters, MD  on 08/16/2023. Electronically signed by Harrel Carina on 08/16/2023.    The documentation recorded by the scribe accurately reflects the service I personally performed and the decisions made by me, Donzetta Matters, MD , 08/16/2023.

## 2023-08-16 ENCOUNTER — Telehealth (INDEPENDENT_AMBULATORY_CARE_PROVIDER_SITE_OTHER): Payer: Self-pay

## 2023-08-16 ENCOUNTER — Encounter (INDEPENDENT_AMBULATORY_CARE_PROVIDER_SITE_OTHER): Payer: Self-pay | Admitting: Internal Medicine

## 2023-08-16 ENCOUNTER — Ambulatory Visit (INDEPENDENT_AMBULATORY_CARE_PROVIDER_SITE_OTHER): Payer: Medicare Other | Admitting: Internal Medicine

## 2023-08-16 VITALS — BP 126/68 | HR 101 | Temp 98.3°F | Resp 20 | Ht 65.0 in | Wt 194.8 lb

## 2023-08-16 DIAGNOSIS — R051 Acute cough: Secondary | ICD-10-CM

## 2023-08-16 DIAGNOSIS — I4892 Unspecified atrial flutter: Secondary | ICD-10-CM

## 2023-08-16 DIAGNOSIS — I152 Hypertension secondary to endocrine disorders: Secondary | ICD-10-CM

## 2023-08-16 DIAGNOSIS — J439 Emphysema, unspecified: Secondary | ICD-10-CM

## 2023-08-16 DIAGNOSIS — R0609 Other forms of dyspnea: Secondary | ICD-10-CM

## 2023-08-16 DIAGNOSIS — R0602 Shortness of breath: Secondary | ICD-10-CM

## 2023-08-16 DIAGNOSIS — J189 Pneumonia, unspecified organism: Secondary | ICD-10-CM

## 2023-08-16 DIAGNOSIS — Z87891 Personal history of nicotine dependence: Secondary | ICD-10-CM

## 2023-08-16 DIAGNOSIS — I480 Paroxysmal atrial fibrillation: Secondary | ICD-10-CM

## 2023-08-16 DIAGNOSIS — E1169 Type 2 diabetes mellitus with other specified complication: Secondary | ICD-10-CM

## 2023-08-16 DIAGNOSIS — G4733 Obstructive sleep apnea (adult) (pediatric): Secondary | ICD-10-CM

## 2023-08-16 DIAGNOSIS — E1159 Type 2 diabetes mellitus with other circulatory complications: Secondary | ICD-10-CM

## 2023-08-16 DIAGNOSIS — I5022 Chronic systolic (congestive) heart failure: Secondary | ICD-10-CM

## 2023-08-16 DIAGNOSIS — R600 Localized edema: Secondary | ICD-10-CM

## 2023-08-16 LAB — SPIROMETRY
FEF 25-75% (Pre-Bronch) %Pred: 22 %
FEF 25-75% (Pre-Bronch) Actual: 0.39 L/s
FEF 25-75% (Pre-Bronch) Pred: 1.76 L/s
FEF Max (Pre-Bronch) %Pred: 42 %
FEF Max (Pre-Bronch) Actual: 2.56 L/s
FEF Max (Pre-Bronch) Pred: 6.02 L/s
FEF2575 (Lower Limit of Normal): 0.68 L/s
FEF2575 (Standard Deviation): 0.81 L/s
FEFMax (Lower Limit of Normal): 4.02 L/s
FEFMax (Standard Deviation): 1.21 L/s
FEV1 (Lower Limit of Normal): 1.67 L
FEV1 (Pre-Bronch) %Pred: 47 %
FEV1 (Pre-Bronch) Actual: 1.16 L
FEV1 (Pre-Bronch) Pred: 2.42 L
FEV1 (Standard Deviation): 0.43 L
FEV1/FVC (Pre-Bronch) %Pred: 74 %
FEV1/FVC (Pre-Bronch) Actual: 57 %
FEV1/FVC (Pre-Bronch) Pred: 75 %
FVC (Lower Limit of Normal): 2.32 L
FVC (Pre-Bronch) %Pred: 63 %
FVC (Pre-Bronch) Actual: 2.05 L
FVC (Pre-Bronch) Pred: 3.23 L
FVC (Standard Deviation): 0.56 L
PEF (Pre-Actual): 153.4 L/min

## 2023-08-16 LAB — EXTERNAL PT/INR: PT INR: 5.2 — AB (ref 0.9–1.1)

## 2023-08-16 MED ORDER — FLUTICASONE-UMECLIDIN-VILANT 200-62.5-25 MCG/ACT IN AEPB
1.0000 | INHALATION_SPRAY | Freq: Every day | RESPIRATORY_TRACT | 3 refills | Status: DC
Start: 2023-08-16 — End: 2023-08-23

## 2023-08-16 NOTE — Progress Notes (Signed)
August 16, 2023     Recheck INR today 5.2 after 1-day hold of 5mg  dose.    Continue to hold warfarin tonight (7.5mg  dose, 12/11), then warfarin 5mg  daily until the next INR check.     Patient verbalized understanding of instructions provided.  Encounter conducted via the telephone.     Burnell Blanks, PharmD, BCPS  Clinical Pharmacy Specialist-Anticoagulation

## 2023-08-16 NOTE — Telephone Encounter (Signed)
Patient was in to see Dr. Lisette Grinder today and went to pick up fluticasone-umeclidinium-vilanterol (TRELEGY) 200-62.5-25 MCG/ACT Aerosol Pwdr, Breath Activated but copay was going to be $802. Patient son would like to know if there is an alternative or to lower the cost. Please advise [phone]

## 2023-08-16 NOTE — Patient Instructions (Addendum)
Please call [phone] to schedule your sleep study  Results take 7-14 days after the test has been completed  Please ensure you have a follow up appointment with your ordering physician to review the study   Proceed with repeat CXR in about 4 weeks  Complete the course of Augmentin  If your cough/sputum production is not continuing to improve, turn in a sputum sample to the lab  Start Trelegy  inhaler - be sure to rinse and spit with water  after use

## 2023-08-18 ENCOUNTER — Encounter (INDEPENDENT_AMBULATORY_CARE_PROVIDER_SITE_OTHER): Payer: Self-pay | Admitting: Internal Medicine

## 2023-08-18 NOTE — Telephone Encounter (Addendum)
Called pharmacy to discuss Trelegy.  Per pharmacist the Trelegy does not require a PA it is covered. The copay is $800 for a 3 month supply.   What would you like to do?

## 2023-08-21 ENCOUNTER — Ambulatory Visit (INDEPENDENT_AMBULATORY_CARE_PROVIDER_SITE_OTHER): Payer: Self-pay

## 2023-08-21 ENCOUNTER — Telehealth (INDEPENDENT_AMBULATORY_CARE_PROVIDER_SITE_OTHER): Payer: Self-pay

## 2023-08-21 DIAGNOSIS — I4892 Unspecified atrial flutter: Secondary | ICD-10-CM

## 2023-08-21 LAB — EXTERNAL PT/INR: PT INR: 3 — AB (ref 0.9–1.1)

## 2023-08-21 NOTE — Telephone Encounter (Signed)
Called to get a test calim for [employer]. No PA needed. Breztri 1 month is $544. Please advise on an alternative.

## 2023-08-21 NOTE — Progress Notes (Signed)
 August 21, 2023    INR Goal:  2-3    Warfarin Indication: Paroxysmal AFib/AFlutter     CHA2DS2-VASc Score:  5 HTN (1), Age >/=75 (2), DM (1), and Vascular Dx (1)  HASBLED Score:  1 Elderly (Age>65)    Referring Provider: Dr. Demetrius Revel     Referral D

## 2023-08-21 NOTE — Telephone Encounter (Signed)
Nausea and having trouble getting a full breath. Son states the data on the pts apple watch has been showing as low at 88-89. This morning he was at 92. Son would like to try the zpak.

## 2023-08-22 ENCOUNTER — Ambulatory Visit (INDEPENDENT_AMBULATORY_CARE_PROVIDER_SITE_OTHER): Payer: Medicare Other | Admitting: Family Medicine

## 2023-08-23 ENCOUNTER — Ambulatory Visit: Payer: Medicare Other

## 2023-08-23 ENCOUNTER — Ambulatory Visit
Admission: RE | Admit: 2023-08-23 | Discharge: 2023-08-23 | Disposition: A | Discharge status: Home / Self Care | Payer: Medicare Other | Source: Ambulatory Visit | Attending: Internal Medicine | Admitting: Internal Medicine

## 2023-08-23 DIAGNOSIS — J189 Pneumonia, unspecified organism: Secondary | ICD-10-CM

## 2023-08-23 DIAGNOSIS — J439 Emphysema, unspecified: Secondary | ICD-10-CM | POA: Insufficient documentation

## 2023-08-23 DIAGNOSIS — R051 Acute cough: Secondary | ICD-10-CM | POA: Insufficient documentation

## 2023-08-23 DIAGNOSIS — R0602 Shortness of breath: Secondary | ICD-10-CM | POA: Insufficient documentation

## 2023-08-23 MED ORDER — TIOTROPIUM BROMIDE MONOHYDRATE 2.5 MCG/ACT IN AERS
2.0000 | INHALATION_SPRAY | Freq: Every day | RESPIRATORY_TRACT | 5 refills | Status: DC
Start: 2023-08-23 — End: 2023-09-11

## 2023-08-23 MED ORDER — AZITHROMYCIN 250 MG PO TABS
ORAL_TABLET | ORAL | 0 refills | Status: AC
Start: 2023-08-23 — End: 2023-08-29

## 2023-08-23 MED ORDER — MOMETASONE FURO-FORMOTEROL FUM 200-5 MCG/ACT IN AERO
2.0000 | INHALATION_SPRAY | Freq: Two times a day (BID) | RESPIRATORY_TRACT | 5 refills | Status: DC
Start: 2023-08-23 — End: 2023-09-11

## 2023-08-23 NOTE — Addendum Note (Signed)
 Addended by: Donzetta Matters on: 08/23/2023 05:15 PM     Modules accepted: Orders

## 2023-08-23 NOTE — Addendum Note (Signed)
 Addended by: Donzetta Matters on: 08/23/2023 05:21 PM     Modules accepted: Orders

## 2023-08-24 ENCOUNTER — Telehealth: Payer: Self-pay

## 2023-08-24 DIAGNOSIS — R053 Chronic cough: Secondary | ICD-10-CM

## 2023-08-24 NOTE — Telephone Encounter (Signed)
 Copied from CRM #[id number]. Topic: Clinical Support - Medical Question  >> Aug 24, 2023 12:13 PM Cung Masterson Whitney Post wrote:  [facility], Jabier EUGENE called about Clinical Support - Medical Question.  Additional details:  Patient's son called stating that insura

## 2023-08-24 NOTE — Telephone Encounter (Signed)
"  I d/c'd Trelegy.  Looks like Mier and Spiriva are preferred. Please let him know that I had to break it down into 2 inhalers. He should let us know if copays are prohibitive. Thanks"    Son advised and verbalized understanding.

## 2023-08-24 NOTE — Telephone Encounter (Signed)
 Script sent with caveat that needs to closely monitor INR. This is why I did not initially prescribe. There are notes that his levels have been running high as it stands.  If sats remain low, low threshhold to go to ER for eval/admission.  Son advised and

## 2023-08-25 MED ORDER — FLUTICASONE FUROATE-VILANTEROL 200-25 MCG/ACT IN AEPB
1.0000 | INHALATION_SPRAY | Freq: Every day | RESPIRATORY_TRACT | 2 refills | Status: DC
Start: 2023-08-25 — End: 2023-10-09

## 2023-08-25 NOTE — Addendum Note (Signed)
 Addended by: Massie Bougie on: 08/25/2023 12:17 PM     Modules accepted: Orders

## 2023-08-25 NOTE — Telephone Encounter (Signed)
 Called pharmacy and the dulera needs a PA. Insurance want BREO or Advair first. Sherrye Payor is $513 for a 1 month supply.     PA initiated via [facility] for: [facility] 200-5MCG/ACT aerosol    Key: [id number]  PA Case ID #: [id number]  Awaiting response.

## 2023-08-25 NOTE — Telephone Encounter (Signed)
 Approved by [facility] NCPDP 2017  PA Case: [id number], Status: Approved, Coverage Starts on: 09/05/2022 12:00:00 AM, Coverage Ends on: 09/04/2024 12:00:00 AM. Questions? Contact [phone].  Authorization Expiration Date: 09/03/2024    Called to advise pharm

## 2023-08-25 NOTE — Telephone Encounter (Signed)
 Per Dr. Lisette Grinder ok for [facility] or Advair. Incruse is about $160 for 1 month, BREO $70 for 1 month. I sent in the BREO 200

## 2023-08-28 ENCOUNTER — Ambulatory Visit (INDEPENDENT_AMBULATORY_CARE_PROVIDER_SITE_OTHER): Payer: Self-pay

## 2023-08-28 ENCOUNTER — Telehealth (INDEPENDENT_AMBULATORY_CARE_PROVIDER_SITE_OTHER): Payer: Self-pay | Admitting: Internal Medicine

## 2023-08-28 DIAGNOSIS — I4892 Unspecified atrial flutter: Secondary | ICD-10-CM

## 2023-08-28 DIAGNOSIS — R0609 Other forms of dyspnea: Secondary | ICD-10-CM

## 2023-08-28 DIAGNOSIS — J189 Pneumonia, unspecified organism: Secondary | ICD-10-CM

## 2023-08-28 LAB — EXTERNAL PT/INR: PT INR: 2.9 — AB (ref 0.9–1.1)

## 2023-08-28 NOTE — Telephone Encounter (Signed)
 CXR with persistent LLL infiltrate and effusion. Recommend CT chest.

## 2023-08-28 NOTE — Progress Notes (Signed)
 August 28, 2023    INR Goal:  2-3    Warfarin Indication: Paroxysmal AFib/AFlutter     CHA2DS2-VASc Score:  5 HTN (1), Age >/=75 (2), DM (1), and Vascular Dx (1)  HASBLED Score:  1 Elderly (Age>65)    Referring Provider: Dr. Demetrius Revel     Referral D

## 2023-08-29 ENCOUNTER — Encounter (INDEPENDENT_AMBULATORY_CARE_PROVIDER_SITE_OTHER): Payer: Self-pay | Admitting: Internal Medicine

## 2023-08-29 ENCOUNTER — Encounter (INDEPENDENT_AMBULATORY_CARE_PROVIDER_SITE_OTHER): Payer: Self-pay

## 2023-08-31 ENCOUNTER — Ambulatory Visit (INDEPENDENT_AMBULATORY_CARE_PROVIDER_SITE_OTHER): Payer: Self-pay

## 2023-08-31 DIAGNOSIS — I4892 Unspecified atrial flutter: Secondary | ICD-10-CM

## 2023-08-31 LAB — EXTERNAL PT/INR: PT INR: 3 — AB (ref 0.9–1.1)

## 2023-08-31 NOTE — Progress Notes (Signed)
 August 31, 2023    INR Goal:  2-3    Warfarin Indication: Paroxysmal AFib/AFlutter     CHA2DS2-VASc Score:  5 HTN (1), Age >/=75 (2), DM (1), and Vascular Dx (1)  HASBLED Score:  1 Elderly (Age>65)    Referring Provider: Dr. Demetrius Revel     Referral D

## 2023-09-01 ENCOUNTER — Ambulatory Visit
Admission: RE | Admit: 2023-09-01 | Discharge: 2023-09-01 | Disposition: A | Discharge status: Home / Self Care | Payer: Medicare Other | Source: Ambulatory Visit | Attending: Internal Medicine | Admitting: Internal Medicine

## 2023-09-01 DIAGNOSIS — J189 Pneumonia, unspecified organism: Secondary | ICD-10-CM | POA: Insufficient documentation

## 2023-09-01 DIAGNOSIS — R0609 Other forms of dyspnea: Secondary | ICD-10-CM | POA: Insufficient documentation

## 2023-09-04 ENCOUNTER — Encounter: Payer: Self-pay | Admitting: Internal Medicine

## 2023-09-07 ENCOUNTER — Ambulatory Visit (INDEPENDENT_AMBULATORY_CARE_PROVIDER_SITE_OTHER): Payer: Self-pay

## 2023-09-07 DIAGNOSIS — I4892 Unspecified atrial flutter: Secondary | ICD-10-CM

## 2023-09-07 LAB — EXTERNAL PT/INR: PT INR: 2.5 — AB (ref 0.9–1.1)

## 2023-09-07 NOTE — Progress Notes (Signed)
September 07, 2023    INR Goal:  2-3    Warfarin Indication: Paroxysmal AFib/AFlutter     CHA2DS2-VASc Score:  5 HTN (1), Age >/=75 (2), DM (1), and Vascular Dx (1)  HASBLED Score:  1 Elderly (Age>65)    Referring Provider: Dr. Demetrius Revel     Referral Date:  09/06/22    Last visit with referring provider:  09/01/22     PMH:    Past Medical History:   Diagnosis Date    Diabetes mellitus     Hyperlipidemia     Hypertension      Current warfarin dose:    Instructed Dose Reported Dose   Warfarin 2.5 mg on Fri and 5 mg AOD   Pt states he misplaced his dosing instruction paper he wrote at last encounter. Reports he mixed up the days of 5 and 2.5 mg, see AC Tracker (2.5 mg Thur/Sun/Tue and 5 mg AOD).    7-day warfarin total: 32.5 mg 7-day warfarin total: 27.5 mg      Lab Results   Component Value Date    INR 2.5 (A) 09/07/2023    INR 3.0 (A) 08/31/2023    INR 2.9 (A) 08/28/2023    INR 3.0 (A) 08/21/2023    INR 5.2 (A) 08/16/2023    INR 6.7 (A) 08/15/2023     Dietary intake of vitamin K: Inconsistent. Typically 4-5 servings of green beans and salad (Spring mix, carrots, tomatoes, onions) per week. May have 1 serving of high vitamin K vegetables (collard greens) once monthly. Patient is in assisted living facility and eats whatever is served at the facility. Reports a little less over the past week.     Alcohol consumption: No alcohol consumption since 1989.      Tobacco use: Former, quit in 1989      Medication changes:  Denies new med changes.      New Medication/Dietary/Herbal Interactions:   Pt no longer taking APAP, previously APAP 2000mg /day.       Concurrent significant interacting medications/supplements:    Medications that may enhance anticoagulant effect or bleeding risks:  APAP (patient takes prn <2000 mg/day)  Allopurinol  Glucosamine  Omega-3 fatty acids  Medications that may diminish anticoagulant effect:  Vitamin C  Metformin  MVI ([employer] brand, vitamin K 25 mcg).     S/sx of bleeding/clotting:    Denies  unusual bleeding or bruising.      Recent Falls: Denies.   Patient uses cane to assist with mobility.      Upcoming procedures/surgeries: Denies      Is patient a candidate for DOAC?  Yes    If yes, date and outcome of discussion: 10/06/22: Per pharmacy liaison, copay for Eliquis is $771.30/90 days (periodic deductible $545+coinsurance $226.30). Once deductible met, Eliquis would be ~$75/month. Patient has Medicare and not eligible for copay card.  Patient prefers to continue warfarin for now.     Others: PNA with chest congestion and productive cough. Denies fever and diarrhea. 08/31/23: Pt reports he's feeling much better now and s/sx have now resolved.   Instructed pt to check INR in the morning M-Th.  INR received after 3pm will be addressed the following business day.  Instructed patient to call [facility] if no call received by 3PM the day after INR testing.     Best Contact:  Best time to reach patient is 8a-3pm.    INR Result:  Lab Results   Component Value Date    INR  2.5 (A) 09/07/2023     via home POCT    =======================================    A/P:  INR 2.5 within therapeutic range, dropped from 3.0 as pt took different doses than instructed and overall lower TWD. Recent supra-therapeutic INRs due to PNA and Augmentin DDI. Of note, pt had been in range with TWD of 45 mg/wk a few weeks ago.   Warfarin 2.5 mg on Friday and 5 mg AOD until next INR check (7-d total will be 30 mg/wk).  Patient confirmed understanding of instructions via the Teachback method. Patient verbally repeated dosing instructions back without error.  Recheck INR in 4 days on 09/11/23 via home POCT  Warfarin has been managed per Clinic Protocol    Patient verbalized understanding of instructions provided.  Interview conducted via telephone.    Lucendia Herrlich, PharmD  Clinical Pharmacy Specialist  [facility]  Ph: [phone]

## 2023-09-10 ENCOUNTER — Ambulatory Visit (INDEPENDENT_AMBULATORY_CARE_PROVIDER_SITE_OTHER): Payer: Medicare Other | Admitting: Family

## 2023-09-10 ENCOUNTER — Emergency Department: Payer: Medicare Other

## 2023-09-10 ENCOUNTER — Encounter (INDEPENDENT_AMBULATORY_CARE_PROVIDER_SITE_OTHER): Payer: Self-pay

## 2023-09-10 ENCOUNTER — Inpatient Hospital Stay: Payer: Medicare Other

## 2023-09-10 ENCOUNTER — Inpatient Hospital Stay
Admission: EM | Admit: 2023-09-10 | Discharge: 2023-09-20 | DRG: 180 | Disposition: A | Discharge status: Home Health | Payer: Medicare Other | Attending: Internal Medicine | Admitting: Internal Medicine

## 2023-09-10 VITALS — BP 153/83 | HR 116 | Temp 98.3°F | Resp 18 | Ht 65.0 in | Wt 189.8 lb

## 2023-09-10 DIAGNOSIS — R0902 Hypoxemia: Secondary | ICD-10-CM | POA: Diagnosis present

## 2023-09-10 DIAGNOSIS — C3402 Malignant neoplasm of left main bronchus: Principal | ICD-10-CM | POA: Diagnosis present

## 2023-09-10 DIAGNOSIS — J439 Emphysema, unspecified: Secondary | ICD-10-CM | POA: Diagnosis present

## 2023-09-10 DIAGNOSIS — J440 Chronic obstructive pulmonary disease with acute lower respiratory infection: Secondary | ICD-10-CM | POA: Diagnosis present

## 2023-09-10 DIAGNOSIS — Z6833 Body mass index (BMI) 33.0-33.9, adult: Secondary | ICD-10-CM

## 2023-09-10 DIAGNOSIS — J9601 Acute respiratory failure with hypoxia: Secondary | ICD-10-CM | POA: Diagnosis present

## 2023-09-10 DIAGNOSIS — R0602 Shortness of breath: Secondary | ICD-10-CM

## 2023-09-10 DIAGNOSIS — J189 Pneumonia, unspecified organism: Principal | ICD-10-CM | POA: Diagnosis present

## 2023-09-10 DIAGNOSIS — Z801 Family history of malignant neoplasm of trachea, bronchus and lung: Secondary | ICD-10-CM

## 2023-09-10 DIAGNOSIS — G4733 Obstructive sleep apnea (adult) (pediatric): Secondary | ICD-10-CM | POA: Diagnosis present

## 2023-09-10 DIAGNOSIS — Z951 Presence of aortocoronary bypass graft: Secondary | ICD-10-CM

## 2023-09-10 DIAGNOSIS — E876 Hypokalemia: Secondary | ICD-10-CM | POA: Diagnosis not present

## 2023-09-10 DIAGNOSIS — C801 Malignant (primary) neoplasm, unspecified: Secondary | ICD-10-CM

## 2023-09-10 DIAGNOSIS — C109 Malignant neoplasm of oropharynx, unspecified: Secondary | ICD-10-CM | POA: Insufficient documentation

## 2023-09-10 DIAGNOSIS — E1169 Type 2 diabetes mellitus with other specified complication: Secondary | ICD-10-CM

## 2023-09-10 DIAGNOSIS — R000 Tachycardia, unspecified: Secondary | ICD-10-CM

## 2023-09-10 DIAGNOSIS — Z87891 Personal history of nicotine dependence: Secondary | ICD-10-CM

## 2023-09-10 DIAGNOSIS — I110 Hypertensive heart disease with heart failure: Secondary | ICD-10-CM | POA: Diagnosis present

## 2023-09-10 DIAGNOSIS — I2510 Atherosclerotic heart disease of native coronary artery without angina pectoris: Secondary | ICD-10-CM | POA: Diagnosis present

## 2023-09-10 DIAGNOSIS — I480 Paroxysmal atrial fibrillation: Secondary | ICD-10-CM | POA: Diagnosis present

## 2023-09-10 DIAGNOSIS — E669 Obesity, unspecified: Secondary | ICD-10-CM | POA: Diagnosis present

## 2023-09-10 DIAGNOSIS — E785 Hyperlipidemia, unspecified: Secondary | ICD-10-CM | POA: Diagnosis present

## 2023-09-10 DIAGNOSIS — Z79899 Other long term (current) drug therapy: Secondary | ICD-10-CM

## 2023-09-10 DIAGNOSIS — E1165 Type 2 diabetes mellitus with hyperglycemia: Secondary | ICD-10-CM | POA: Diagnosis present

## 2023-09-10 DIAGNOSIS — Z7984 Long term (current) use of oral hypoglycemic drugs: Secondary | ICD-10-CM

## 2023-09-10 DIAGNOSIS — Z7901 Long term (current) use of anticoagulants: Secondary | ICD-10-CM

## 2023-09-10 DIAGNOSIS — C3400 Malignant neoplasm of unspecified main bronchus: Secondary | ICD-10-CM | POA: Diagnosis present

## 2023-09-10 DIAGNOSIS — A419 Sepsis, unspecified organism: Secondary | ICD-10-CM

## 2023-09-10 DIAGNOSIS — C799 Secondary malignant neoplasm of unspecified site: Secondary | ICD-10-CM | POA: Diagnosis present

## 2023-09-10 DIAGNOSIS — I5032 Chronic diastolic (congestive) heart failure: Secondary | ICD-10-CM | POA: Diagnosis present

## 2023-09-10 DIAGNOSIS — C7800 Secondary malignant neoplasm of unspecified lung: Secondary | ICD-10-CM | POA: Insufficient documentation

## 2023-09-10 DIAGNOSIS — J910 Malignant pleural effusion: Secondary | ICD-10-CM | POA: Diagnosis present

## 2023-09-10 DIAGNOSIS — R609 Edema, unspecified: Secondary | ICD-10-CM

## 2023-09-10 LAB — COMPREHENSIVE METABOLIC PANEL
ALT: 25 U/L (ref <=55)
AST (SGOT): 36 U/L (ref <=41)
Albumin/Globulin Ratio: 0.6 — ABNORMAL LOW (ref 0.9–2.2)
Albumin: 3 g/dL — ABNORMAL LOW (ref 3.5–5.0)
Alkaline Phosphatase: 192 U/L — ABNORMAL HIGH (ref 37–117)
Anion Gap: 13 (ref 5.0–15.0)
BUN: 29 mg/dL — ABNORMAL HIGH (ref 9–28)
Bilirubin, Total: 0.8 mg/dL (ref 0.2–1.2)
CO2: 26 meq/L (ref 17–29)
Calcium: 10.5 mg/dL — ABNORMAL HIGH (ref 7.9–10.2)
Chloride: 101 meq/L (ref 99–111)
Creatinine: 0.9 mg/dL (ref 0.5–1.5)
GFR: >60 mL/min/{1.73_m2} (ref >=60.0)
Globulin: 5.2 g/dL — ABNORMAL HIGH (ref 2.0–3.6)
Glucose: 220 mg/dL — ABNORMAL HIGH (ref 70–100)
Potassium: 3.9 meq/L (ref 3.5–5.3)
Protein, Total: 8.2 g/dL (ref 6.0–8.3)
Sodium: 140 meq/L (ref 135–145)

## 2023-09-10 LAB — LACTIC ACID
Whole Blood Lactic Acid: 3.2 mmol/L — ABNORMAL HIGH (ref 0.2–2.0)
Whole Blood Lactic Acid: 2.5 mmol/L — ABNORMAL HIGH (ref 0.2–2.0)

## 2023-09-10 LAB — ECG 12-LEAD
Q-T Interval: 308 ms
QRS Duration: 74 ms
QTC Calculation (Bezet): 431 ms
R Axis: 1 degrees
T Axis: 32 degrees
Ventricular Rate: 118 {beats}/min

## 2023-09-10 LAB — LAB USE ONLY - CBC WITH DIFFERENTIAL
Absolute Basophils: 0.03 10*3/uL (ref 0.00–0.08)
Absolute Eosinophils: 0.03 10*3/uL (ref 0.00–0.44)
Absolute Immature Granulocytes: 0.07 10*3/uL (ref 0.00–0.07)
Absolute Lymphocytes: 2.03 10*3/uL (ref 0.42–3.22)
Absolute Monocytes: 1.02 10*3/uL — ABNORMAL HIGH (ref 0.21–0.85)
Absolute Neutrophils: 11.98 10*3/uL — ABNORMAL HIGH (ref 1.10–6.33)
Absolute nRBC: 0 10*3/uL (ref <=0.00)
Basophils %: 0.2 %
Eosinophils %: 0.2 %
Hematocrit: 47.1 % (ref 37.6–49.6)
Hemoglobin: 15.7 g/dL (ref 12.5–17.1)
Immature Granulocytes %: 0.5 %
Lymphocytes %: 13.4 %
MCH: 30.7 pg (ref 25.1–33.5)
MCHC: 33.3 g/dL (ref 31.5–35.8)
MCV: 92 fL (ref 78.0–96.0)
MPV: 11.6 fL (ref 8.9–12.5)
Monocytes %: 6.7 %
Neutrophils %: 79 %
Platelet Count: 367 10*3/uL — ABNORMAL HIGH (ref 142–346)
Preliminary Absolute Neutrophil Count: 11.98 10*3/uL — ABNORMAL HIGH (ref 1.10–6.33)
RBC: 5.12 10*6/uL (ref 4.20–5.90)
RDW: 14 % (ref 11–15)
WBC: 15.16 10*3/uL — ABNORMAL HIGH (ref 3.10–9.50)
nRBC %: 0 /100{WBCs} (ref <=0.0)

## 2023-09-10 LAB — NT-PROBNP: NT-ProBNP: 1059 pg/mL — ABNORMAL HIGH (ref <450)

## 2023-09-10 LAB — HIGH SENSITIVITY TROPONIN-I: hs Troponin: 8 ng/L (ref <=35.0)

## 2023-09-10 LAB — PT/INR
INR: 2.9 (ref 0.9–1.1)
PT: 31.9 s — ABNORMAL HIGH (ref 10.1–12.9)

## 2023-09-10 LAB — COVID-19 (SARS-COV-2) & INFLUENZA  A/B, NAA (ROCHE LIAT)
Influenza A RNA: NOT DETECTED
Influenza B RNA: NOT DETECTED
SARS-CoV-2 (COVID-19) RNA: NOT DETECTED

## 2023-09-10 MED ORDER — LEVALBUTEROL HCL 1.25 MG/3ML IN NEBU
1.2500 mg | INHALATION_SOLUTION | Freq: Once | RESPIRATORY_TRACT | Status: DC
Start: 2023-09-10 — End: 2023-09-10
  Administered 2023-09-10: 1.25 mg via RESPIRATORY_TRACT

## 2023-09-10 MED ORDER — BENZOCAINE-MENTHOL MT LOZG (WRAP)
1.0000 | LOZENGE | OROMUCOSAL | Status: DC | PRN
Start: 2023-09-10 — End: 2023-09-20

## 2023-09-10 MED ORDER — MAGNESIUM SULFATE IN D5W 1-5 GM/100ML-% IV SOLN
1.0000 g | INTRAVENOUS | Status: DC | PRN
Start: 2023-09-10 — End: 2023-09-17
  Administered 2023-09-12 – 2023-09-15 (×4): 1 g via INTRAVENOUS
  Filled 2023-09-10 (×4): qty 100

## 2023-09-10 MED ORDER — STERILE WATER FOR INJECTION IJ/IV SOLN (WRAP)
2.0000 g | Freq: Once | Status: AC
Start: 2023-09-10 — End: 2023-09-10
  Administered 2023-09-10: 2 g via INTRAVENOUS

## 2023-09-10 MED ORDER — GLUCOSE 40 % PO GEL (WRAP)
15.0000 g | ORAL | Status: DC | PRN
Start: 2023-09-10 — End: 2023-09-20

## 2023-09-10 MED ORDER — POTASSIUM CHLORIDE 10 MEQ/100ML IV SOLN (WRAP)
10.0000 meq | INTRAVENOUS | Status: DC | PRN
Start: 2023-09-10 — End: 2023-09-20

## 2023-09-10 MED ORDER — FUROSEMIDE 10 MG/ML IJ SOLN
40.0000 mg | Freq: Once | INTRAMUSCULAR | Status: AC
Start: 2023-09-10 — End: 2023-09-10
  Administered 2023-09-10: 40 mg via INTRAVENOUS
  Filled 2023-09-10: qty 4

## 2023-09-10 MED ORDER — POTASSIUM CHLORIDE CRYS ER 20 MEQ PO TBCR
0.0000 meq | EXTENDED_RELEASE_TABLET | ORAL | Status: AC | PRN
Start: 2023-09-10 — End: ?
  Administered 2023-09-19: 20 meq via ORAL
  Filled 2023-09-10: qty 1

## 2023-09-10 MED ORDER — ACETAMINOPHEN 325 MG PO TABS
650.0000 mg | ORAL_TABLET | ORAL | Status: DC | PRN
Start: 2023-09-10 — End: 2023-09-20
  Administered 2023-09-16 – 2023-09-19 (×3): 650 mg via ORAL
  Filled 2023-09-10 (×3): qty 2

## 2023-09-10 MED ORDER — CARBOXYMETHYLCELLULOSE SOD PF 0.5 % OP SOLN
1.0000 [drp] | Freq: Three times a day (TID) | OPHTHALMIC | Status: DC | PRN
Start: 2023-09-10 — End: 2023-09-20

## 2023-09-10 MED ORDER — DEXTROSE 50 % IV SOLN
12.5000 g | INTRAVENOUS | Status: DC | PRN
Start: 2023-09-10 — End: 2023-09-20

## 2023-09-10 MED ORDER — NALOXONE HCL 0.4 MG/ML IJ SOLN (WRAP)
0.2000 mg | INTRAMUSCULAR | Status: DC | PRN
Start: 2023-09-10 — End: 2023-09-20

## 2023-09-10 MED ORDER — POTASSIUM & SODIUM PHOSPHATES 280-160-250 MG PO PACK
2.0000 | PACK | ORAL | Status: DC | PRN
Start: 2023-09-10 — End: 2023-09-20

## 2023-09-10 MED ORDER — BENZONATATE 100 MG PO CAPS
100.0000 mg | ORAL_CAPSULE | Freq: Three times a day (TID) | ORAL | Status: DC | PRN
Start: 2023-09-10 — End: 2023-09-20

## 2023-09-10 MED ORDER — POTASSIUM CHLORIDE 20 MEQ PO PACK
0.0000 meq | PACK | ORAL | Status: DC | PRN
Start: 2023-09-10 — End: 2023-09-20
  Administered 2023-09-12: 60 meq via ORAL
  Administered 2023-09-16: 40 meq via ORAL
  Filled 2023-09-10: qty 2
  Filled 2023-09-10: qty 3

## 2023-09-10 MED ORDER — SALINE SPRAY 0.65 % NA SOLN
2.0000 | NASAL | Status: DC | PRN
Start: 2023-09-10 — End: 2023-09-20

## 2023-09-10 MED ORDER — GLUCAGON 1 MG IJ SOLR (WRAP)
1.0000 mg | INTRAMUSCULAR | Status: DC | PRN
Start: 2023-09-10 — End: 2023-09-20

## 2023-09-10 MED ORDER — SODIUM CHLORIDE 0.9 % IV BOLUS
500.0000 mL | Freq: Once | INTRAVENOUS | Status: DC
Start: 2023-09-10 — End: 2023-09-10

## 2023-09-10 MED ORDER — ALBUTEROL-IPRATROPIUM 2.5-0.5 (3) MG/3ML IN SOLN
3.0000 mL | RESPIRATORY_TRACT | Status: DC
Start: 2023-09-11 — End: 2023-09-11
  Administered 2023-09-11 (×2): 3 mL via RESPIRATORY_TRACT
  Filled 2023-09-10 (×3): qty 3

## 2023-09-10 MED ORDER — DEXTROSE 10 % IV BOLUS
12.5000 g | INTRAVENOUS | Status: DC | PRN
Start: 2023-09-10 — End: 2023-09-20

## 2023-09-10 MED ORDER — IOHEXOL 350 MG/ML IV SOLN
80.0000 mL | Freq: Once | INTRAVENOUS | Status: AC | PRN
Start: 2023-09-10 — End: 2023-09-10
  Administered 2023-09-10: 80 mL via INTRAVENOUS

## 2023-09-10 MED ORDER — MELATONIN 3 MG PO TABS
3.0000 mg | ORAL_TABLET | Freq: Every evening | ORAL | Status: AC | PRN
Start: 2023-09-10 — End: ?
  Administered 2023-09-19: 3 mg via ORAL
  Filled 2023-09-10: qty 1

## 2023-09-10 MED ORDER — VANCOMYCIN HCL IN NACL 1.75-0.9 GM/500ML-% IV SOLN
20.0000 mg/kg | Freq: Once | INTRAVENOUS | Status: AC
Start: 2023-09-10 — End: 2023-09-11
  Administered 2023-09-10: 1750 mg via INTRAVENOUS
  Filled 2023-09-10: qty 500

## 2023-09-10 NOTE — ED to IP RN Note (Signed)
 [facility] HOSPITAL EMERGENCY DEPT  ED NURSING NOTE FOR THE RECEIVING INPATIENT NURSE   ED NURSE Burnard pee   [facility] [id number]   ED charge bethany   ADMISSION INFORMATION   Keven Osborn is a 83 y.o. male admitted with an ED diagnosis of:    1. Pneumonia of left lower lobe due to infectious organism    2. Hypoxia    3. Sepsis, due to unspecified organism, unspecified whether acute organ dysfunction present         Isolation: None   Allergies: Cephalexin   Holding Orders confirmed? No   Belongings Documented? N/A   Home medications sent to pharmacy confirmed? N/A   NURSING CARE   Patient Comes From:   Mental Status: Home Independent  alert and oriented   ADL: Independent with all ADLs   Ambulation: ambulates with: cane without difficulty   Pertinent Information  and Safety Concerns:     [place] Violence Risk Level: Low Pt presents to ED d/t worsening SOB and dyspnea with minimal exertion since dx of walking PNA around Thanksgiving. D/t bilateral leg swelling, thought to possibly have CHF, no official diagnosis as of yet. Does not use oxygen at home, currently on 3L [place] d/t sp02 ~90%RA.     CT / NIH   CT Head ordered on this patient?  No   NIH/Dysphagia assessment done prior to admission? N/A   VITAL SIGNS (at the time of this note)      Vitals:    09/10/23 2133   BP: 152/85   Pulse: (!) 119   Resp: 16   Temp: 98.3 F (36.8 C)   SpO2: 95%     Pain Score: 0-No pain (09/10/23 2133)

## 2023-09-10 NOTE — Progress Notes (Signed)
 [facility]  PROGRESS NOTE     Patient: Carl [name]   Date: 09/10/2023   MRN: [id number]       Carl [name] is a 83 y.o. male      HISTORY     History obtained from: Patient    Chief Complaint   Patient presents with    URI     Pt c/o difficulty breathing, chest congestion, cough. No otc med. Started since thanksgiving.         HPI patient presents with difficulty taking a deep breath, chest congestion, coughing. Patient states symptoms have been present since Thanksgiving. Feels like he started to worsen around Christmas time- 11 days ago. Denies known fever. Denies chills and sweats. Patient states cough to be non productive. Patient denies known wheezing but he does have a rattle when he breathes. Admits to history of smoking many years ago- quit 35-36 years ago now. Denies known history of COPD but was told he might have it. Patient just saw pulmonologist 3-4 weeks ago who wanted to test for COPD and get baseline results but has not been tested as patient has been sick since then. Denies chest pains. Son noticed that after the patient was walking around in the grocery store he could not catch his breath.     Review of Systems  See above HPI    History:    Pertinent Past Medical, Surgical, Family and Social History were reviewed.      Current Medications[1]    Allergies[2]    Medications and Allergies reviewed.    PHYSICAL EXAM     Vitals:    09/10/23 1706 09/10/23 1830   BP: 153/83    BP Site: Right arm    Patient Position: Sitting    Cuff Size: Medium    Pulse: (!) 120 (!) 116   Resp: 18    Temp: 98.3 F (36.8 C)    TempSrc: Tympanic    SpO2: 95% 96%   Weight: 86.1 kg (189 lb 12.8 oz)    Height: 1.651 m (5' 5)        Physical Exam  Constitutional:       General: He is awake. He is not in acute distress.     Appearance: Normal appearance. He is well-developed. He is not ill-appearing, toxic-appearing or diaphoretic.   HENT:      Head: Normocephalic and atraumatic.     Eyes:  Conjunctivae are normal. Right eye exhibits no discharge. Left eye exhibits no discharge. Cardiovascular:      Rate and Rhythm: Tachycardia present. Rhythm irregular.      Heart sounds: Normal heart sounds.   Pulmonary:      Effort: Pulmonary effort is normal. No respiratory distress.      Breath sounds: Decreased air movement present. Examination of the left-middle field reveals decreased breath sounds. Examination of the right-lower field reveals decreased breath sounds. Examination of the left-lower field reveals decreased breath sounds. Decreased breath sounds present.      Comments: Post neb- lung sounds improved to upper lobe bilaterally but decreased/diminished lung sound remain to lower lobe- left worse than the right. Intermittent faint crackle to the RLL  Musculoskeletal:      Cervical back: Neck supple.      Right lower leg: Pitting Edema present.      Left lower leg: Pitting Edema present.   Neurological:      Mental Status: He is alert and oriented to person, place,  and time.   Skin:     General: Skin is warm and dry.   Psychiatric:         Mood and Affect: Mood normal.         Behavior: Behavior normal. Behavior is cooperative.         Thought Content: Thought content normal.         Judgment: Judgment normal.   Vitals and nursing note reviewed.         UCC COURSE     There were no labs reviewed with this patient during the visit.    There were no x-rays reviewed with this patient during the visit.    Current Inpatient Medications with Last Dose Taken[3]    PROCEDURES     Nebulizer Treatment    Date/Time: 09/10/2023 6:30 PM    Performed by: Valery Ronal BRAVO, FNP  Authorized by: Valery Ronal BRAVO, FNP    Consent:     Consent obtained:  Verbal    Consent given by:  Patient    Indication of treatment:  Shortness of breath  Pre-Nebulizer:     Disposition:  Stable    O2 saturation:  94    Meds administered and amount:  Levalbuterol  (Xopenex ) neb solution  Post-nebulizer:     Disposition:  Patient tolerated well     02 saturation:  96  EKG Read    Date/Time: 09/10/2023 10:49 PM    Performed by: Valery Ronal BRAVO, FNP  Authorized by: Valery Ronal BRAVO, FNP    Previous ECG:     Previous ECG:  Compared to current    Comparison ECG info:  No significant change noted    Similarity:  No change  Interpretation:     Interpretation: abnormal    Rate:     ECG rate assessment: tachycardic    Rhythm:     Rhythm: atrial fibrillation    ST segments:     ST segments:  Non-specific      MEDICAL DECISION MAKING     History, physical, labs/studies most consistent with tachycardia, pitting edema, SOB, hypoxia  as the diagnosis.    Chart Review:  Prior PCP, Specialist and/or ED notes reviewed today: Yes reviewed prior xray results and EKG results in the chart  Prior labs/images/studies reviewed today: Yes xray and EKG results    Differential Diagnosis: pneumonia, PE, CHF exacerbation, afib with RVR, cute cardiac event      ASSESSMENT     Encounter Diagnoses   Name Primary?    Tachycardia Yes    Shortness of breath     Pitting edema     Hypoxia             PLAN      PLAN:   EKG performed in clinic- no major change from prior on chart  Neb provided in clinic- tolerated well. Only slight increase in oxygen saturation but breath sounds did not improve  Discussed several times with patient and son the importance of patient going to the ED for further workup  Discussed my limitations in the urgent care setting to give patient an appropriate thorough workup that he needs (no xray available, unable to get STAT labs)  Discussed differentials with patient and son and that I cannot appropriately treat patient or send him home without having a full workup completed.     After long discussion- they agree to go to the ED via private vehicle with son driving      Orders  Placed This Encounter   Procedures    Nebulizer Treatment    EKG Read     Requested Prescriptions      No prescriptions requested or ordered in this encounter       Discussed results and diagnosis with  patient/family.  Reviewed warning signs for worsening condition, as well as, indications for follow-up with primary care physician and return to urgent care clinic.   Patient/family expressed understanding of instructions.     An After Visit Summary was provided to the patient.           [1] No current facility-administered medications for this visit.  No current outpatient medications on file.    Facility-Administered Medications Ordered in Other Visits:     vancomycin  (VANCOCIN ) 1750 mg in sodium chloride  0.9% 500 mL (premix), 20 mg/kg, Intravenous, Once, Vickerman, Laura K, FNP, Last Rate: 250 mL/hr at 09/10/23 2125, 1,750 mg at 09/10/23 2125  [2]   Allergies  Allergen Reactions    Cephalexin Rash   [3]   No current facility-administered medications for this visit.     Facility-Administered Medications Ordered in Other Visits   Medication Dose Route Frequency Last Rate Last Admin    vancomycin  (VANCOCIN ) 1750 mg in sodium chloride  0.9% 500 mL (premix)  20 mg/kg Intravenous Once 250 mL/hr at 09/10/23 2125 1,750 mg at 09/10/23 2125

## 2023-09-10 NOTE — ED Provider Notes (Signed)
 [facility] EMERGENCY DEPARTMENT  ATTENDING PHYSICIAN ATTESTATION NOTE     Patient Name: Carl [name], Carl [name]  Encounter Date:  09/10/2023  Attending Physician: Madelyn Rosina LABOR, MD   Midlevel Provider: Hobson Leita POUR, FNP  Patient DOB:  [birth year]  Age: 83 y.o. male  MRN:  [id number]  PCP: Hassell Lorre Khat, MD  Room:  N 37/N 37         Diagnosis/Disposition:     Final Impression  Final diagnoses:   Hypoxia   Pneumonia of left lower lobe due to infectious organism     Disposition  ED Disposition       ED Disposition   Admit    Condition   --    Date/Time   Sun Sep 10, 2023  9:08 PM    Comment   Admitting Physician: RONALD MYLAR [[id number]]   Service:: Medicine [[id number]]   Estimated Length of Stay: > or = to 2 midnights   Tentative Discharge Plan?: Home or Self Care [1]   Does patient need telemetry?: Yes   Is patient 18 yrs or greater?: Yes   Telemetry type (separate Telemetry order is also required):: Adult telemetry   Was admission to another facility considered?: No   Detail:: [facility] resource required               Follow up  No follow-up provider specified.  Prescriptions  New Prescriptions    No medications on file           MDM:      Nursing Triage note: ambulatory with cane c/o worsening sob. states went to a pulm once & was told might have CHF. no official dx. sob worsening today. states hx of afib & hasnt missed any doses of medicaiton. SOB worsens when laying flat    Attending Attestation:  The patient was seen and examined by the resident or APP (physician assistant or nurse practitioner) and the plan of care was discussed with me.   Please see the separately documented midlevel note for additional information including but not limited to full history of present illness, review of systems and comprehensive physical exam.  83 y/o male presenting with persistent SOB despite 2 round of abx for PNA.  He is hypoxic on arrival with pulse ox 87% on RA and this is improved with 3L NC.    He is afebrile, WBC  elevated to 15, lactate elevated to 3.2  Repeat lactate improved to 2.5  COVID and flu are negative  CXR is showing moderate left pleural effusion, increased compared to Dec 18th  CTA chest showing increased moderate to large left pleural effusion with assoc compressive atelectasis of the left lower lobe and lingula.  Multiple enlarging pulmonary nodules and enlarging left anterior cardiophrenic mass  He is given cefepime  and vancomycin   The patient was admitted and handed off to Dr. Esmati, Saliman, MD (medicine). We discussed all aspects of the work-up that had been completed in the ED, any pending studies/therapies, and all clinical decision making at the time care was handed off.     ED Course as of 09/10/23 2122   [place] Sep 10, 2023   1954 Patient hypoxic it 89% on room air.  Placed on 3 L nasal cannula. [LV]   2000 Concern for fluid overload.  IVF's discontinued [LV]   2039 Requested admission through hospital service [LV]   2108 Patient was accepted by internal medicine, Dr. RONALD  at this time. [LV]      ED  Course User Index  [LV] Hobson Leita POUR, FNP       Medical Decision Making  Amount and/or Complexity of Data Reviewed  Labs: ordered.  Radiology: ordered.  ECG/medicine tests: ordered.    Risk  Prescription drug management.  Decision regarding hospitalization.           Interpretations, Clinical Decision Tools and Critical Care:       EKG: I reviewed and independently interpreted the patient's EKG as: atrial fibrillation at 118 bpm, nonspecific ST changes.          Radiology: All available radiologist's interpretations were reviewed if not separately interpreted by me.           O2 Sat:  The patient's oxygen saturation was 87% on room air. This was independently interpreted by me as Hypoxic.   Cardiac Monitoring: I independely reviewed and interpreted the patient's rhythm strip as atrial fibrillation at 113 bpm.        Procedures:   Procedures        ATTESTATIONS     Scribe Attestation: I was acting  as a scribe for Madelyn Rosina LABOR, MD on [facility]   Treatment Team: Scribe: Lorene Marseille R    Documentation Notes:  Parts of this note were generated by the Epic EMR system/ Dragon speech recognition and may contain inherent errors or omissions not intended by the user. Grammatical errors, random word insertions, deletions, pronoun errors and incomplete sentences are occasional consequences of this technology due to software limitations. Not all errors are caught or corrected.  My documentation is often completed after the patient is no longer under my clinical care. In some cases, the Epic EMR may pull updated results into the above documentation which may not reflect all results or information that were available to me at the time of my medical decision making.   If there are questions or concerns about the content of this note or information contained within the body of this dictation they should be addressed directly with the author for clarification.                  Madelyn Rosina LABOR, MD  09/11/23 [phone]

## 2023-09-10 NOTE — H&P (Signed)
 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     # Acute hypoxic respiratory failure with loculated left side effusion   # Persistent left lower lobe pneumonia with underlying COPD   # Left side loculated effusion     -Failed outpatient oral antibiotics treatment  -CTX and CTA - persistance LLL infiltration vs loculated effusion - Pending official read  -Obtain Sputum culture, MRSA swab, Urine antigens, Mycoplasma, Procal  -Check Fungal culture and AFB cultures  -Start on Cefepime  and Zyvox  for MRSA and Zithromax  for atypical coverage   -Discontinue Zyvox  if MRSA swab is negative  -Continue Duoneb q4hr  -On home Trelegy Ellipta daily   -No need for steroid as no wheezing or concern for exacerbation  -Following with [place] pulmonary team - Consultation requested via epic chat   -Patient likely need IP for drainage and chest tube placement   -Holding home warfarin in anticipation of tube placement    # CAD s/p CABG with Chronic CHF   # Paroxysmal A-fib  # Hypertension   -Following with [place] [facility]   -ECHO - Normal EF with EF of 67%  -Rate controlled with metoprolol  Xl 100 mg daily - continue Coumadin  therapy   -Continue lasix  20 mg daily   -Continue Norvasc  5 mg, Chlorthalidone  12.5 mg daily, lisinopril  40 mg daily, Imdur  30 mg daily   -Holding home Warfarin for now     # OSA - Undergoing outpatient sleep studies - Not on CPAP as of now.     # DM - Sliding scale with coverage     Full code   Regular diet   Lovenox      Additional Diagnoses:     Patient has BMI=Body mass index is 33.66 kg/m.  Diagnosis: Obesity based on BMI criteria          Disposition: (Please see  PAF column for Expected D/C Date)   Today's date: 09/11/2023  Admit Date: 09/10/2023  7:27 PM  Service status: Inpatient: risk of morbidity and mortality, risk of progressive disease, and risk of readmission  Clinical Milestones: TBD  Anticipated discharge needs: TBD    History of Presenting Illness:     Carl [name] is a 83 y.o. male with COPD/emphysema, former tobacco use, S/p CABG 2005, pAF on Coumadin  OSA, GERD, HTN, HLD, DM presented to hospital with SOB.     Patient with persistent shortness of breath and dyspnea on exertion since Thanksgiving, completed 2 course of oral antibiotic as an outpatient follow-up with the pulmonary team in [place], repeated CT chest was requested by pulmonary team which was done on 12/27 and repeated CT angio today in the emergency room shows enlargement increase in effusion and infiltration in the right side.  Patient was placed on supplemental oxygen, denies any active symptoms at the moment resting comfortably in bed.  Symptoms are positive for shortness of breath, dyspnea but denies any fever, chills, abdominal and urinary symptoms.  Patient was admitted for further evaluation    Past Medical History:     Past Medical History:   Diagnosis Date    Diabetes mellitus  Hyperlipidemia     Hypertension      Available old records reviewed, including:  previous charts and outside hospital workup.     Past Surgical History:   Past Surgical History[1]    Social History:   Tobacco Use History[2]  Social History     Substance and Sexual Activity   Alcohol Use Not Currently     Social History     Substance and Sexual Activity   Drug Use Not Currently     Allergies:   Allergies[3]    Medications:     Home Medications       Med List Status: RN Completed Set By: Claude Evalene Marit JONELLE, RN at 09/10/2023 11:05 PM              allopurinol  (ZYLOPRIM ) 300 MG tablet     Take 1 tablet (300 mg) by mouth daily     amLODIPine  (NORVASC ) 5 MG tablet     Take 1 tablet (5 mg) by mouth daily      atorvastatin  (LIPITOR) 40 MG tablet     Take 1 tablet (40 mg) by mouth daily     chlorthalidone  25 MG tablet     Take 0.5 tablets (12.5 mg) by mouth daily     clobetasol (TEMOVATE) 0.05 % cream     Apply topically as needed     cloNIDine  (CATAPRES ) 0.2 MG tablet     Take 1 tablet (0.2 mg) by mouth 2 (two) times daily     fluticasone  furoate-vilanterol (BREO ELLIPTA ) 200-25 MCG/ACT Aerosol Pwdr, Breath Activated     Inhale 1 puff into the lungs daily     furosemide  (LASIX ) 20 MG tablet     Take 1 tablet (20 mg) by mouth daily     glipiZIDE  (GLUCOTROL  XL) 5 MG 24 hr tablet     Take 1 tablet (5 mg) by mouth daily     Glucosamine-Chondroit-Vit C-Mn (GLUCOSAMINE 1500 COMPLEX PO)     Take 2 tablets by mouth daily     isosorbide  mononitrate (IMDUR ) 30 MG 24 hr tablet     Take 1 tablet (30 mg) by mouth daily     levalbuterol  (XOPENEX ) nebulizer solution 1.25 mg (Expired)     1.25 mg, Nebulization, Once, On Sun 09/10/23 at 1830, For 1 dose     lisinopril  (ZESTRIL ) 40 MG tablet     Take 1 tablet (40 mg) by mouth daily     metFORMIN  (GLUCOPHAGE -XR) 500 MG 24 hr tablet     Take 2 tablets (1,000 mg) by mouth 2 (two) times daily     metoprolol  succinate (TOPROL -XL) 100 MG 24 hr tablet     Take 1 tablet (100 mg) by mouth daily     Vitamin D, Cholecalciferol, 50 MCG (2000 UT) Cap     Take 2,000 Units by mouth daily     warfarin (COUMADIN ) 5 MG tablet     Take 1.5 tablets (7.5 mg) by mouth every evening          Method by which medications were confirmed on admission: From previous chart     Review of Systems:   Review of Systems   Constitutional:  Negative for chills, fever, malaise/fatigue and weight loss.   HENT:  Negative for hearing loss and nosebleeds.    Respiratory:  Positive for cough, sputum production and shortness of breath. Negative for hemoptysis, wheezing and stridor.    Cardiovascular:  Negative for chest pain, palpitations, orthopnea and leg swelling.  Gastrointestinal:  Negative for abdominal pain, blood in  stool, constipation, diarrhea, heartburn, melena, nausea and vomiting.   Genitourinary:  Negative for dysuria, flank pain, frequency, hematuria and urgency.   Musculoskeletal:  Negative for back pain, joint pain and myalgias.   Neurological:  Negative for speech change, focal weakness, seizures and weakness.   Psychiatric/Behavioral:  The patient is not nervous/anxious.        Physical Exam:   Patient Vitals for the past 24 hrs:   BP Temp Temp src Pulse Resp SpO2 Height Weight   09/10/23 2340 152/80 97.3 F (36.3 C) Oral (!) 118 18 -- 1.6 m (5' 3) 86.2 kg (190 lb)   09/10/23 2258 152/80 97.3 F (36.3 C) Oral (!) 118 16 96 % -- --   09/10/23 2133 152/85 98.3 F (36.8 C) Oral (!) 119 16 95 % -- --   09/10/23 2043 132/74 -- -- (!) 113 -- 96 % -- --   09/10/23 2033 132/81 -- -- (!) 113 -- 95 % -- --   09/10/23 2014 131/70 -- -- (!) 111 -- 96 % -- --   09/10/23 1959 131/74 -- -- (!) 115 -- 95 % -- --   09/10/23 1924 -- -- -- -- -- 95 % -- --   09/10/23 1921 (!) 174/94 98.7 F (37.1 C) Oral (!) 116 19 (!) 87 % 1.6 m (5' 3) 86.2 kg (190 lb)   09/10/23 1908 -- -- -- 68 -- 95 % -- --     Body mass index is 33.66 kg/m.    Intake/Output Summary (Last 24 hours) at 09/11/2023 0055  Last data filed at 09/10/2023 2300  Gross per 24 hour   Intake 100 ml   Output 300 ml   Net -200 ml       Physical Exam  Constitutional:       Appearance: Normal appearance.   HENT:      Head: Normocephalic and atraumatic.   Cardiovascular:      Rate and Rhythm: Regular rhythm. Tachycardia present.      Heart sounds: No murmur heard.     No friction rub.   Pulmonary:      Effort: Pulmonary effort is normal. No respiratory distress.      Breath sounds: Normal breath sounds. No stridor. No wheezing or rales.      Comments: Nasal cannula in place/decreased breath sounds on left side  Chest:      Chest wall: No tenderness.   Abdominal:      General: Abdomen is flat. There is no distension.      Palpations: Abdomen is soft.      Tenderness: There is no  abdominal tenderness. There is no right CVA tenderness or left CVA tenderness.   Musculoskeletal:         General: No swelling or tenderness.      Right lower leg: No edema.      Left lower leg: No edema.   Skin:     Coloration: Skin is not jaundiced or pale.      Findings: No erythema, lesion or rash.   Neurological:      General: No focal deficit present.      Mental Status: He is alert and oriented to person, place, and time. Mental status is at baseline.      Motor: No weakness.        Labs:     Results       Procedure Component Value Units Date/Time  Lactic Acid [[id number]]  (Abnormal) Collected: 09/10/23 2214    Specimen: Blood, Venous Updated: 09/10/23 2223     Whole Blood Lactic Acid 2.5 mmol/L     PT/INR [[id number]]  (Abnormal) Collected: 09/10/23 1946    Specimen: Blood, Venous Updated: 09/10/23 2023     PT 31.9 sec      INR 2.9    NT-ProBNP [[id number]]  (Abnormal) Collected: 09/10/23 1946    Specimen: Blood, Venous Updated: 09/10/23 2022     NT-ProBNP 1,059 pg/mL     High Sensitivity Troponin-I [[id number]]  (Normal) Collected: 09/10/23 1946    Specimen: Blood, Venous Updated: 09/10/23 2022     hs Troponin 8.0 ng/L     Comprehensive Metabolic Panel [[id number]]  (Abnormal) Collected: 09/10/23 1946    Specimen: Blood, Venous Updated: 09/10/23 2020     Glucose 220 mg/dL      BUN 29 mg/dL      Creatinine 0.9 mg/dL      Sodium 859 mEq/L      Potassium 3.9 mEq/L      Chloride 101 mEq/L      CO2 26 mEq/L      Calcium  10.5 mg/dL      Anion Gap 86.9     GFR >60.0 mL/min/1.73 m2      AST (SGOT) 36 U/L      ALT 25 U/L      Alkaline Phosphatase 192 U/L      Albumin  3.0 g/dL      Protein, Total 8.2 g/dL      Globulin 5.2 g/dL      Albumin /Globulin Ratio 0.6     Bilirubin, Total 0.8 mg/dL     RNCPI-80 and Influenza (Liat) (symptomatic) [[id number]]  (Normal) Collected: 09/10/23 1946    Specimen: Swab from Anterior Nares Updated: 09/10/23 2019     SARS-CoV-2 (COVID-19) RNA Not Detected     Influenza A RNA Not  Detected     Influenza B RNA Not Detected    Narrative:      A result of Detected indicates POSITIVE for the presence of viral RNA  A result of Not Detected indicates NEGATIVE for the presence of viral RNA    Test performed using the Roche cobas Liat SARS-CoV-2 & Influenza A/B assay. This is a multiplex real-time RT-PCR assay for the detection of SARS-CoV-2, influenza A, and influenza B virus RNA. Viral nucleic acids may persist in vivo, independent of viability. Detection of viral nucleic acid does not imply the presence of infectious virus, or that virus nucleic acid is the cause of clinical symptoms. Negative results do not preclude SARS-CoV-2, influenza A, and/or influenza B infection and should not be used as the sole basis for diagnosis, treatment or other patient management decisions. Invalid results may be due to inhibiting substances in the specimen and recollection should occur.     CBC with Differential (Order) [[id number]]  (Abnormal) Collected: 09/10/23 1946    Specimen: Blood, Venous Updated: 09/10/23 2006    Narrative:      The following orders were created for panel order CBC with Differential (Order).  Procedure                               Abnormality         Status                     ---------                               -----------         ------  CBC with Differential (...[[id number]]  Abnormal            Final result                 Please view results for these tests on the individual orders.    CBC with Differential (Component) [[id number]]  (Abnormal) Collected: 09/10/23 1946    Specimen: Blood, Venous Updated: 09/10/23 2006     WBC 15.16 x10 3/uL      Hemoglobin 15.7 g/dL      Hematocrit 52.8 %      Platelet Count 367 x10 3/uL      MPV 11.6 fL      RBC 5.12 x10 6/uL      MCV 92.0 fL      MCH 30.7 pg      MCHC 33.3 g/dL      RDW 14 %      nRBC % 0.0 /100 WBC      Absolute nRBC 0.00 x10 3/uL      Preliminary Absolute Neutrophil Count 11.98 x10 3/uL      Neutrophils  % 79.0 %      Lymphocytes % 13.4 %      Monocytes % 6.7 %      Eosinophils % 0.2 %      Basophils % 0.2 %      Immature Granulocytes % 0.5 %      Absolute Neutrophils 11.98 x10 3/uL      Absolute Lymphocytes 2.03 x10 3/uL      Absolute Monocytes 1.02 x10 3/uL      Absolute Eosinophils 0.03 x10 3/uL      Absolute Basophils 0.03 x10 3/uL      Absolute Immature Granulocytes 0.07 x10 3/uL     Lactic Acid [[id number]]  (Abnormal) Collected: 09/10/23 1946    Specimen: Blood, Venous Updated: 09/10/23 1956     Whole Blood Lactic Acid 3.2 mmol/L     Culture, Blood, Aerobic And Anaerobic [[id number]] Collected: 09/10/23 1946    Specimen: Blood, Venous Updated: 09/10/23 1955          Imaging personally reviewed, including:   XR Chest  AP Portable    Result Date: 09/10/2023  Moderate left pleural effusion, increased compared to December 18. Hildegard Bayard, MD 09/10/2023 8:04 PM     Safety Checklist  DVT prophylaxis:  CHEST guideline (See page e199S) Chemical   Foley:  [place] Rn Foley protocol Not present   IVs:  Peripheral IV   PT/OT: Ordered   Daily CBC & or Chem ordered:  SHM/ABIM guidelines (see #5) Yes, due to clinical and lab instability     Karma Rail, MD  Department of Medicine  [facility]            [1]   Past Surgical History:  Procedure Laterality Date    CARDIAC SURGERY  2004    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    SKIN BIOPSY      TONSILLECTOMY     [2]   Social History  Tobacco Use   Smoking Status Former    Types: Cigarettes   Smokeless Tobacco Not on file   Tobacco Comments    Quit smoking June 1989, smoked 30 years before   [3]   Allergies  Allergen Reactions    Cephalexin Rash

## 2023-09-10 NOTE — ED Provider Notes (Signed)
 [facility] EMERGENCY DEPARTMENT  ADVANCED PRACTICE PROVIDER HISTORY AND PHYSICAL EXAM     Patient Name: Carl [name], Carl [name]  Department:[facility] EMERGENCY DEPT  Encounter Date:  09/10/2023  Attending Physician: Madelyn Rosina LABOR, MD   Age: 83 y.o. male  Patient Room: N 37/N 37  PCP: Hassell Lorre Khat, MD        Diagnosis/Disposition:     Final diagnoses:   Hypoxia   Pneumonia of left lower lobe due to infectious organism   Sepsis, due to unspecified organism, unspecified whether acute organ dysfunction present       ED Disposition       ED Disposition   Admit    Condition   --    Date/Time   Sun Sep 10, 2023  9:08 PM    Comment   Admitting Physician: RONALD MYLAR [[id number]]   Service:: Medicine [106]   Estimated Length of Stay: > or = to 2 midnights   Tentative Discharge Plan?: Home or Self Care [1]   Does patient need telemetry?: Yes   Is patient 18 yrs or greater?: Yes   Telemetry type (separate Telemetry order is also required):: Adult telemetry   Was admission to another facility considered?: No   Detail:: [facility] resource required                 Follow-Up Providers (if applicable)    No follow-up provider specified.     New Prescriptions    No medications on file           Medical Decision Making:     Initial Differential Diagnosis:  Initial differential diagnosis to include but not limited to: pulmonary embolism, congestive heart failure, asthma/COPD, pneumothorax, pneumonia, pleural effusion, upper respiratory infection, medication reaction    Plan:  Patient is to be admitted to internal medicine services for further evaluation and workup.  Pending CTA for PE    Final Impression:    The patient was admitted and handed off to Dr. Ronald. We discussed all aspects of the work-up that had been completed in the ED, any pending studies/therapies, and all clinical decision making at the time care was handed off.     ED Course as of 09/10/23 2217   [place] Sep 10, 2023   1954 Patient hypoxic it 89% on room air.  Placed on 3 L  nasal cannula. [LV]   2000 Concern for fluid overload.  IVF's discontinued [LV]   2039 Requested admission through hospital service [LV]   2108 Patient was accepted by internal medicine, Dr. RONALD  at this time. [LV]      ED Course User Index  [LV] Hobson Leita POUR, FNP       Medical Decision Making  Patient presents emergency department with concerns for persistent shortness of breath after 2 rounds of outpatient antibiotics for treatment of pneumonia.  Patient was hypoxic upon arrival with a respiratory rate of 87 was placed on 3 L nasal cannula.  Notably patient does not wear any oxygen at baseline. Patient was negative for COVID, flu.  EKG was obtained on arrival shows atrial fibrillation similar to the patient's previous with a ventricular rate of 118, QTc 431, no acute ST elevation, depression, acute cardiac dysrhythmia was noted at this time.  Patient is currently taking Coumadin .  His troponin is unremarkable.  CMP was obtained that does show hyperglycemia with a glucose of 220, BUN of 29 however the patient's creatinine and GFR within defined limits.  Patient also does not have an  anion gap.  Alkaline phosphate is elevated at 192 however patient is abdomen is nontender at this time.  BMP was obtained related to the patient's pitting edema to his bilateral lower extremities as well as history of CHF and shortness of breath and was 1059.  Patient received 40 mg of Lasix  IV which she tolerated well.  PT/INR was obtained with a PTT of 31.9, INR of 2.9.  Sepsis was suspected upon patient's arrival related to his presentation his lactic acid level was elevated at 3.21 blood culture was obtained.  Patient CBC does show a white blood cell count of 15.16.  Considered giving him 500 mL of normal saline however IV fluids were held related to his history of CHF with concern for acute exacerbation.  Patient received cefepime  as well as vancomycin  in the ED which he tolerated well.  Patient admitted pending CTA for  PE.  Hospitalist team is aware that CTA is pending at this time.  Patient was admitted in stable condition    Problems Addressed:  Hypoxia: acute illness or injury  Pneumonia of left lower lobe due to infectious organism: acute illness or injury    Amount and/or Complexity of Data Reviewed  External Data Reviewed: labs, radiology, ECG and notes.  Labs: ordered. Decision-making details documented in ED Course.  Radiology: ordered and independent interpretation performed. Decision-making details documented in ED Course.  ECG/medicine tests: ordered and independent interpretation performed. Decision-making details documented in ED Course.    Risk  Prescription drug management.  Decision regarding hospitalization.  Diagnosis or treatment significantly limited by social determinants of health.        Was management discussed with a consultant? : I discussed management with internal medicine . The patient's condition and all pertinent labs and/or radiology studies discussed. The consultant recommended admission.           Social Determinants of Health Considerations : poor health literacy           History of Presenting Illness:     Nursing Triage note: ambulatory with cane c/o worsening sob. states went to a pulm once & was told might have CHF. no official dx. sob worsening today. states hx of afib & hasnt missed any doses of medicaiton. SOB worsens when laying flat  Chief complaint: Shortness of Breath, Cough, and Chest congestion    Carl [name] is a 83 y.o. male with a significant medical history of atrial fibrillation he is currently taking Coumadin , type 2 diabetes, hyperlipidemia, hypertension presents emergency department with persistent and worsening shortness of breath since he was treated for pneumonia with 2 rounds of outpatient antibiotics after Thanksgiving.  Patient states that he has been progressively short of breath over the last 2 months.  Denies any cough, acute chest pain, abdominal pain,  nausea, vomiting, diarrhea.  He states that his legs have been increasingly swollen and denies taking any diuretics.  Upon initial assessment patient is hypoxic on room air at 87% was placed on 3 L nasal cannula.  He is alert and oriented however is in no acute distress.          Review of Systems:  Physical Exam:     Review of Systems   Constitutional:  Positive for fatigue.   Respiratory:  Positive for shortness of breath.    Cardiovascular:  Positive for leg swelling.       Positive and negative ROS per above and in HPI. All other systems reviewed and negative.  Pulse 68  BP (!) 174/94  Resp 19  SpO2 95 %  Temp 98.7 F (37.1 C)     Physical Exam  Vitals and nursing note reviewed.   Constitutional:       General: He is not in acute distress.     Appearance: Normal appearance. He is normal weight. He is not ill-appearing, toxic-appearing or diaphoretic.   HENT:      Head: Normocephalic and atraumatic.      Nose: Nose normal.      Mouth/Throat:      Mouth: Mucous membranes are moist.      Pharynx: Oropharynx is clear.   Eyes:      Extraocular Movements: Extraocular movements intact.      Conjunctiva/sclera: Conjunctivae normal.      Pupils: Pupils are equal, round, and reactive to light.   Cardiovascular:      Rate and Rhythm: Tachycardia present. Rhythm irregular.      Pulses: Normal pulses.      Heart sounds: Normal heart sounds.   Pulmonary:      Effort: Pulmonary effort is normal.      Breath sounds: Examination of the left-middle field reveals decreased breath sounds. Examination of the right-lower field reveals decreased breath sounds. Examination of the left-lower field reveals decreased breath sounds. Decreased breath sounds present.   Abdominal:      General: Abdomen is flat. Bowel sounds are normal.      Palpations: Abdomen is soft.      Tenderness: There is no abdominal tenderness.   Musculoskeletal:         General: Normal range of motion.      Right lower leg: 2+ Pitting Edema present.      Left  lower leg: 2+ Pitting Edema present.   Skin:     General: Skin is warm and dry.      Capillary Refill: Capillary refill takes less than 2 seconds.   Neurological:      Mental Status: He is alert. Mental status is at baseline.   Psychiatric:         Mood and Affect: Mood normal.         Behavior: Behavior normal.             Interpretations, Clinical Decision Tools and Critical Care:            Critical Care Time(not including procedures): 26 minutes.  Due to the high risk of critical illness or multi-organ failure at initial presentation and/or during ED course.   System(s) at risk for compromise:  respiratory  Critical Diagnosis:   1. Pneumonia of left lower lobe due to infectious organism    2. Hypoxia    3. Sepsis, due to unspecified organism, unspecified whether acute organ dysfunction present       The patient was Hypotensive:   No    The patient was Hypoxic:   Yes  This does not including time spent performing other reported procedures or services.  Critical care time involved full attention to the patient's condition and included:   Review of nursing notes and/or old charts - Yes  Documentation time - Yes  Care, transfer of care, and discharge plans - Yes  Obtaining necessary history from family, EMS, nursing home staff and/or treating physicians - Yes  Review of medications, allergies, and vital signs - Yes   Consultant collaboration on findings and treatment options - Yes  Ordering, interpreting, and reviewing diagnostic studies/tab tests - Yes  Procedures:   Procedures      Attestations:     Documentation Notes:  Parts of this note were generated by the Epic EMR system/ Dragon speech recognition and may contain inherent errors or omissions not intended by the user. Grammatical errors, random word insertions, deletions, pronoun errors and incomplete sentences are occasional consequences of this technology due to software limitations. Not all errors are caught or corrected.  My documentation is often  completed after the patient is no longer under my clinical care. In some cases, the Epic EMR may pull updated results into the above documentation which may not reflect all results or information that were available to me at the time of my medical decision making.   If there are questions or concerns about the content of this note or information contained within the body of this dictation they should be addressed directly with the author for clarification.                  Tayten Bergdoll K, FNP  09/10/23 2138       Hobson Leita POUR, FNP  09/10/23 2217

## 2023-09-10 NOTE — ED Triage Notes (Signed)
 Pt presents to ED d/t continued SOB worsening with minimal exertion since dx of walking PNA over Thanksgiving. -CP, - respiratory hx. -N/V/fever, - recent sick contacts. AAOX4. Respirations even and unlabored at rest, dyspneic with movement

## 2023-09-11 DIAGNOSIS — I480 Paroxysmal atrial fibrillation: Secondary | ICD-10-CM

## 2023-09-11 DIAGNOSIS — G4733 Obstructive sleep apnea (adult) (pediatric): Secondary | ICD-10-CM

## 2023-09-11 DIAGNOSIS — J9601 Acute respiratory failure with hypoxia: Secondary | ICD-10-CM

## 2023-09-11 DIAGNOSIS — J410 Simple chronic bronchitis: Secondary | ICD-10-CM

## 2023-09-11 DIAGNOSIS — R053 Chronic cough: Secondary | ICD-10-CM

## 2023-09-11 DIAGNOSIS — J440 Chronic obstructive pulmonary disease with acute lower respiratory infection: Secondary | ICD-10-CM

## 2023-09-11 LAB — CBC
Absolute nRBC: 0 10*3/uL (ref <=0.00)
Absolute nRBC: 0 10*3/uL (ref <=0.00)
Hematocrit: 39.6 % (ref 37.6–49.6)
Hematocrit: 42 % (ref 37.6–49.6)
Hemoglobin: 13.2 g/dL (ref 12.5–17.1)
Hemoglobin: 13.7 g/dL (ref 12.5–17.1)
MCH: 30.5 pg (ref 25.1–33.5)
MCH: 30 pg (ref 25.1–33.5)
MCHC: 33.3 g/dL (ref 31.5–35.8)
MCHC: 32.6 g/dL (ref 31.5–35.8)
MCV: 91.5 fL (ref 78.0–96.0)
MCV: 92.1 fL (ref 78.0–96.0)
MPV: 11.9 fL (ref 8.9–12.5)
MPV: 11.7 fL (ref 8.9–12.5)
Platelet Count: 325 10*3/uL (ref 142–346)
Platelet Count: 340 10*3/uL (ref 142–346)
RBC: 4.33 10*6/uL (ref 4.20–5.90)
RBC: 4.56 10*6/uL (ref 4.20–5.90)
RDW: 14 % (ref 11–15)
RDW: 14 % (ref 11–15)
WBC: 16.11 10*3/uL — ABNORMAL HIGH (ref 3.10–9.50)
WBC: 17.55 10*3/uL — ABNORMAL HIGH (ref 3.10–9.50)
nRBC %: 0 /100{WBCs} (ref <=0.0)
nRBC %: 0 /100{WBCs} (ref <=0.0)

## 2023-09-11 LAB — COMPREHENSIVE METABOLIC PANEL
ALT: 17 U/L (ref <=55)
AST (SGOT): 43 U/L — ABNORMAL HIGH (ref <=41)
Albumin/Globulin Ratio: 0.6 — ABNORMAL LOW (ref 0.9–2.2)
Albumin: 2.4 g/dL — ABNORMAL LOW (ref 3.5–5.0)
Alkaline Phosphatase: 141 U/L — ABNORMAL HIGH (ref 37–117)
Anion Gap: 15 (ref 5.0–15.0)
BUN: 28 mg/dL (ref 9–28)
Bilirubin, Total: 0.6 mg/dL (ref 0.2–1.2)
CO2: 23 meq/L (ref 17–29)
Calcium: 9.5 mg/dL (ref 7.9–10.2)
Chloride: 103 meq/L (ref 99–111)
Creatinine: 0.9 mg/dL (ref 0.5–1.5)
GFR: >60 mL/min/{1.73_m2} (ref >=60.0)
Globulin: 4.1 g/dL — ABNORMAL HIGH (ref 2.0–3.6)
Glucose: 188 mg/dL — ABNORMAL HIGH (ref 70–100)
Potassium: 4.1 meq/L (ref 3.5–5.3)
Protein, Total: 6.5 g/dL (ref 6.0–8.3)
Sodium: 141 meq/L (ref 135–145)

## 2023-09-11 LAB — URINE STREPTOCOCCUS PNEUMONIAE ANTIGEN: Urine Streptococcus pneumoniae Antigen: NEGATIVE

## 2023-09-11 LAB — PT/INR
INR: 2.8 (ref 0.9–1.1)
INR: 3 (ref 0.9–1.1)
PT: 31 s — ABNORMAL HIGH (ref 10.1–12.9)
PT: 33.5 s — ABNORMAL HIGH (ref 10.1–12.9)

## 2023-09-11 LAB — URINE LEGIONELLA PNEUMOPHILA SEROGROUP 1 ANTIGEN: Urine Legionella pneumophila serogroup 1 Antigen: NEGATIVE

## 2023-09-11 LAB — NARES, MRSA (METHICILLIN-RESISTANT STAPHYLOCOCCUS AUREUS) SCREENING, PCR: MRSA (methicillin resistant Staphylococcus aureus) DNA: NOT DETECTED

## 2023-09-11 MED ORDER — AMLODIPINE BESYLATE 5 MG PO TABS
5.0000 mg | ORAL_TABLET | Freq: Every day | ORAL | Status: DC
Start: 2023-09-11 — End: 2023-09-16
  Administered 2023-09-11 – 2023-09-15 (×5): 5 mg via ORAL
  Filled 2023-09-11 (×6): qty 1

## 2023-09-11 MED ORDER — ALLOPURINOL 100 MG PO TABS
300.0000 mg | ORAL_TABLET | Freq: Every day | ORAL | Status: DC
Start: 2023-09-11 — End: 2023-09-20
  Administered 2023-09-11 – 2023-09-20 (×10): 300 mg via ORAL
  Filled 2023-09-11 (×9): qty 3

## 2023-09-11 MED ORDER — METOPROLOL SUCCINATE ER 50 MG PO TB24
100.0000 mg | ORAL_TABLET | Freq: Every day | ORAL | Status: DC
Start: 2023-09-11 — End: 2023-09-16
  Administered 2023-09-11 – 2023-09-16 (×7): 100 mg via ORAL
  Filled 2023-09-11 (×7): qty 2

## 2023-09-11 MED ORDER — AZITHROMYCIN 500 MG IN 250 ML NS IVPB VIAL-MATE (CNR)
500.0000 mg | INTRAVENOUS | Status: DC
Start: 2023-09-11 — End: 2023-09-11
  Administered 2023-09-11: 500 mg via INTRAVENOUS
  Filled 2023-09-11: qty 250

## 2023-09-11 MED ORDER — FUROSEMIDE 20 MG PO TABS
20.0000 mg | ORAL_TABLET | Freq: Every day | ORAL | Status: DC
Start: 2023-09-11 — End: 2023-09-15
  Administered 2023-09-11 – 2023-09-14 (×4): 20 mg via ORAL
  Filled 2023-09-11 (×4): qty 1

## 2023-09-11 MED ORDER — HYDROCHLOROTHIAZIDE 25 MG PO TABS
25.0000 mg | ORAL_TABLET | Freq: Every day | ORAL | Status: DC
Start: 2023-09-11 — End: 2023-09-16
  Administered 2023-09-11 – 2023-09-15 (×5): 25 mg via ORAL
  Filled 2023-09-11 (×6): qty 1

## 2023-09-11 MED ORDER — ALBUTEROL-IPRATROPIUM 2.5-0.5 (3) MG/3ML IN SOLN
3.0000 mL | Freq: Four times a day (QID) | RESPIRATORY_TRACT | Status: DC
Start: 2023-09-11 — End: 2023-09-20
  Administered 2023-09-11 – 2023-09-20 (×25): 3 mL via RESPIRATORY_TRACT
  Filled 2023-09-11 (×22): qty 3

## 2023-09-11 MED ORDER — LINEZOLID 600 MG PO TABS
600.0000 mg | ORAL_TABLET | Freq: Two times a day (BID) | ORAL | Status: DC
Start: 2023-09-11 — End: 2023-09-11
  Administered 2023-09-11: 600 mg via ORAL
  Filled 2023-09-11 (×3): qty 1

## 2023-09-11 MED ORDER — ISOSORBIDE MONONITRATE ER 30 MG PO TB24
30.0000 mg | ORAL_TABLET | Freq: Every day | ORAL | Status: DC
Start: 2023-09-11 — End: 2023-09-16
  Administered 2023-09-11 – 2023-09-16 (×6): 30 mg via ORAL
  Filled 2023-09-11 (×7): qty 1

## 2023-09-11 MED ORDER — FLUTICASONE FUROATE-VILANTEROL 200-25 MCG/ACT IN AEPB
1.0000 | INHALATION_SPRAY | Freq: Every morning | RESPIRATORY_TRACT | Status: DC
Start: 2023-09-11 — End: 2023-09-20
  Administered 2023-09-11 – 2023-09-20 (×10): 1 via RESPIRATORY_TRACT
  Filled 2023-09-11: qty 14

## 2023-09-11 MED ORDER — ATORVASTATIN CALCIUM 40 MG PO TABS
40.0000 mg | ORAL_TABLET | Freq: Every day | ORAL | Status: DC
Start: 2023-09-11 — End: 2023-09-20
  Administered 2023-09-11 – 2023-09-20 (×10): 40 mg via ORAL
  Filled 2023-09-11 (×9): qty 1

## 2023-09-11 MED ORDER — LISINOPRIL 20 MG PO TABS
40.0000 mg | ORAL_TABLET | Freq: Every day | ORAL | Status: DC
Start: 2023-09-11 — End: 2023-09-16
  Administered 2023-09-11 – 2023-09-15 (×5): 40 mg via ORAL
  Filled 2023-09-11 (×5): qty 2

## 2023-09-11 MED ORDER — HEPARIN SODIUM (PORCINE) 5000 UNIT/ML IJ SOLN
4000.0000 [IU] | Freq: Once | INTRAMUSCULAR | Status: DC
Start: 2023-09-11 — End: 2023-09-11

## 2023-09-11 MED ORDER — VITAMIN K1 10 MG/ML IJ SOLN
5.0000 mg | Freq: Once | INTRAVENOUS | Status: AC
Start: 2023-09-11 — End: 2023-09-11
  Administered 2023-09-11: 5 mg via INTRAVENOUS
  Filled 2023-09-11: qty 0.5

## 2023-09-11 MED ORDER — HEPARIN (PORCINE) IN D5W 50-5 UNIT/ML-% IV SOLN (UNITS/KG/HR ONLY)
11.6000 [IU]/kg/h | INTRAVENOUS | Status: DC
Start: 2023-09-11 — End: 2023-09-11

## 2023-09-11 MED ORDER — AZITHROMYCIN 250 MG PO TABS
500.0000 mg | ORAL_TABLET | ORAL | Status: AC
Start: 2023-09-12 — End: 2023-09-13
  Administered 2023-09-12 – 2023-09-13 (×2): 500 mg via ORAL
  Filled 2023-09-11 (×2): qty 2

## 2023-09-11 MED ORDER — SODIUM CHLORIDE 0.9 % IV MBP
2.0000 g | Freq: Three times a day (TID) | INTRAVENOUS | Status: DC
Start: 2023-09-11 — End: 2023-09-16
  Administered 2023-09-11 – 2023-09-16 (×16): 2 g via INTRAVENOUS
  Filled 2023-09-11 (×13): qty 2

## 2023-09-11 NOTE — Progress Notes (Signed)
 CM will continue to follow for discharge needs/planning.     09/11/23 1336   Patient Type   Within 30 Days of Previous Admission? No   Healthcare Decisions   Interviewed: Patient;Family   Interviewee Contact Information: Jonothan Rakers (Son)  [phone] (Mobile) is a the bedside and confers with the patient during assesment.   Orientation/Decision Making Abilities of Patient Alert and Oriented x3, able to make decisions   Advance Directive Patient does not have advance directive   Prior to admission   Prior level of function Independent with ADLs;Ambulates with assistive device  (SPC)   Type of Residence Private residence  ([address] [place], [place], elevator)   Home Layout One level   Have running water , electricity, heat, etc? Yes   Living Arrangements Alone   How do you get to your MD appointments? son   How do you get your groceries? self   Who fixes your meals? self   Who does your laundry? self   Who picks up your prescriptions? self, mail order   Dressing Independent   Grooming Independent   Feeding Independent   Bathing Independent   Toileting Independent   DME Currently at [facility] [place];Other (Comment);BP Cuff;Glucometer;ADL- Grab Bars  (INR machine)   Home Care/Community Services None   Prior SNF admission? (Detail) NA   Prior Rehab admission? (Detail) NA   Discharge Planning   Support Systems Children   Patient expects to be discharged to: home   Anticipated Veteran plan discussed with: Patient   Mode of transportation: Private car (family member)   Does the patient have perscription coverage? Yes  (Insurance verified)   Physicist, medical Strain   How hard is it for you to pay for the very basics like food, housing, medical care, and heating? Not hard   Housing Stability   In the last 12 months, was there a time when you were not able to pay the mortgage or rent on time? N   In the past 12 months, how many times have you moved where you were living? 0   At any time in the past 12  months, were you homeless or living in a shelter (including now)? N   Transportation Needs   In the past 12 months, has lack of transportation kept you from medical appointments or from getting medications? no   In the past 12 months, has lack of transportation kept you from meetings, work, or from getting things needed for daily living? No   Consults/Providers   PT Evaluation Needed 1   OT Evalulation Needed 1   SLP Evaluation Needed 2   Correct PCP listed in Epic?   (Transitioning to a new doctor. Has an appointment in 3 days.  New provider is in the same office.)   Family and PCP   In case you are admitted, transferred or discharged, would like family notified? Yes   Name of family member to be notified Jonothan Rakers (Son)  [phone] [facility])   In case you are admitted, transferred or discharged, would like your PCP notified? Yes     Richerd Lehmann MSN, RN, CCM   [facility] - Remote Case Manager

## 2023-09-11 NOTE — Plan of Care (Addendum)
 Adult Observation Progress Note    Shift Note:    General: Patient VSS stable, in no apparent distress at this time.  Neuro: Patient is AOx4. Patient denies numbness or tingling. Perrla.   Cardio: Not on telemetry,S1, S2 auscultated.   Resp: Patient on 3LPM via NC with lung sounds bilaterally on auscultation.   Integ: See flowsheet  MSK: Patient ambulates: independently with: standby assist. low/mod/high: Mod falls risk.   GI: Bowel sounds auscultated all 4 quadrants.   GU: Patient is continent of urine.   IV Access: IV: 22g  in: LAC.     No significant events or provider communication. Patient denies pain at this time, and is resting comfortably in bed.    BM during shift: YES/NO: no    Pending Orders:     IV Abx  Vancomycin   Lasix  PO  DW  Nebs   Sputum    Discharge Plan: To TBD, pending medical clearance    Social/Family Visits: Na    POC update: pt updated with POC during bedside report      Vitals:    09/11/23 1246 09/11/23 1712 09/11/23 1942 09/11/23 2330   BP: 116/72 120/69 131/80 137/77   Pulse: (!) 102 100 (!) 105 (!) 108   Resp: 18 17 18 17    Temp: 98.1 F (36.7 C) 97.8 F (36.6 C) 97.7 F (36.5 C) 98.4 F (36.9 C)   TempSrc: Oral Oral Oral Oral   SpO2: 96% 97% 96% 95%   Weight:       Height:           Patient Lines/Drains/Airways Status       Active Lines, Drains and Airways       Name Placement date Placement time Site Days    Peripheral IV 09/10/23 22 G Diffusion Left Antecubital 09/10/23  1954  Antecubital  1                    Problem: Moderate/High Fall Risk Score >5  Goal: Patient will remain free of falls  Outcome: Progressing  Flowsheets (Taken 09/11/2023 2100)  Moderate Risk (6-13):   MOD-Consider activation of bed alarm if appropriate   MOD-Apply bed exit alarm if patient is confused   MOD-Floor mat at bedside (where available) if appropriate   MOD-Consider a move closer to Nurses Station   MOD-Remain with patient during toileting   MOD-Place bedside commode and assistive devices out of sight  when not in use   MOD-Re-orient confused patients   MOD-Utilize diversion activities   MOD-Perform dangle, stand, walk (DSW) prior to mobilization   MOD-Request PT/OT consult order for patients with gait/mobility impairment   MOD-Use gait belt when appropriate   MOD-include family in multidisciplinary POC discussions   MOD- Consider video monitoring     Problem: Compromised Sensory Perception  Goal: Sensory Perception Interventions  Outcome: Progressing  Flowsheets (Taken 09/11/2023 2100)  Sensory Perception Interventions: Offload heels, Pad bony prominences, Reposition q 2hrs/turn Clock, Q2 hour skin assessment under devices if present     Problem: Compromised Activity/Mobility  Goal: Activity/Mobility Interventions  Outcome: Progressing  Flowsheets (Taken 09/11/2023 2100)  Activity/Mobility Interventions: Pad bony prominences, TAP Seated positioning system when OOB, Promote PMP, Reposition q 2 hrs / turn clock, Offload heels     Problem: Inadequate Gas Exchange  Goal: Adequate oxygenation and improved ventilation  Outcome: Progressing  Flowsheets (Taken 09/11/2023 2111)  Adequate oxygenation and improved ventilation:   Assess lung sounds   Provide mechanical and  oxygen support to facilitate gas exchange   Position for maximum ventilatory efficiency   Teach/reinforce use of incentive spirometer 10 times per hour while awake, cough and deep breath as needed   Increase activity as tolerated/progressive mobility   Plan activities to conserve energy: plan rest periods   Consult/collaborate with Respiratory Therapy  Goal: Patent Airway maintained  Outcome: Progressing  Flowsheets (Taken 09/11/2023 2111)  Patent airway maintained:   Position patient for maximum ventilatory efficiency   Provide adequate fluid intake to liquefy secretions   Suction secretions as needed   Reinforce use of ordered respiratory interventions (i.e. CPAP, BiPAP, Incentive Spirometer, Acapella, etc.)   Reposition patient every 2 hours and as needed  unless able to self-reposition

## 2023-09-11 NOTE — Progress Notes (Signed)
 Active Hospital Problems    Diagnosis    Hypoxia        LOS # 1      Summary of Discharge Plan:  TBD    Identified Possible Discharge Barriers:  Medical Clearance  3LO2  IV abx  Pulmonology following  Thoracentesis pending INR <1.5 (pt on chronic warfarin)    CM Interventions and Outcome:  Per Covering CM chart review, pt not medically ready for discharge pending resolution of barriers noted above.     Discussed above Discharge Plan with (patient, family, Care Team, others):      Case Management will continue to follow on patient's discharge needs.     Sari Forster RN, BSN  Case Manager I  [facility]

## 2023-09-11 NOTE — Progress Notes (Signed)
 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      # Acute hypoxic respiratory failure with loculated left side effusion   # Persistent left lower lobe pneumonia   # Left side loculated effusion   # COPD, not in acute exacerbation     Patient currently on 3 liters nasal canula  Failed outpatient oral antibiotics treatment  CTX and CTA - persistance LLL infiltration vs loculated effusion   MRSA nares negative. Will discontinue Linezolid    Continue cefepime  and azithromycin   Will follow-up cultures  Continue Duoneb q4hr  On home Trelegy Ellipta daily   No need for steroid as no wheezing or concern for exacerbation  Pulmonology consulted. Plan for thoracentesis when INR <1.5     # Paroxysmal atrial fibrillation on Coumadin   Coumadin  held on admission. Discussed with Pulmonology, Patient given IV vitamin K 5 mg in anticipation for procedure. INR 3.0. Will hold heparin  infusion at this time.    # CAD s/p CABG with Chronic CHF   # Hypertension   Following with [place] cardiology   ECHO - Normal EF with EF of 67%  Rate controlled with metoprolol  Xl 100 mg daily   Continue lasix  20 mg daily   Continue Norvasc  5 mg, Chlorthalidone  12.5 mg daily, lisinopril  40 mg daily, Imdur  30 mg daily   Holding home Warfarin for now      # OSA   Undergoing outpatient sleep studies - Not on CPAP as of now.      # DM   Sliding scale insulin       Case discussed with: Patient, patient's son, RN, Pulmonology     Additional Diagnoses:                 Safety Checklist:     DVT prophylaxis:  CHEST guideline (See page [phone]) Mechanical   Foley:  [place] Rn Foley protocol Not present   IVs:  Peripheral IV   PT/OT: Not needed   Daily CBC & or Chem ordered:  SHM/ABIM guidelines (see #5) Yes, due  to clinical and lab instability   Reference for approximate charges of common labs: CBC auto diff - $76  BMP - $99  Mg - $79    Lines:     Patient Lines/Drains/Airways Status       Active PICC Line / CVC Line / PIV Line / Drain / Airway / Intraosseous Line / Epidural Line / ART Line / Line / Wound / Pressure Ulcer / NG/OG Tube       Name Placement date Placement time Site Days    Peripheral IV 09/10/23 22 G Diffusion Left Antecubital 09/10/23  1954  Antecubital  less than 1                     Disposition: (Please see PAF column for Expected D/C Date)   Today's date: 09/11/2023  Admit Date: 09/10/2023  7:27 PM  LOS: 1  Clinical Milestones:   Anticipated discharge needs:       Subjective     CC: Hypoxia    Interval History/24 hour events: No acute events    HPI/Subjective: Patient reports breathing improved. He denies chest pain, nausea, vomiting, abdominal pain.  Review of Systems:     As per HPI    Physical Exam:     VITAL SIGNS PHYSICAL EXAM   Temp:  [97.3 F (36.3 C)-98.7 F (37.1 C)] 98.1 F (36.7 C)  Heart Rate:  [68-120] 102  Resp Rate:  [16-19] 18  BP: (116-174)/(70-94) 116/72        Intake/Output Summary (Last 24 hours) at 09/11/2023 1610  Last data filed at 09/10/2023 2300  Gross per 24 hour   Intake 100 ml   Output 300 ml   Net -200 ml    Physical Exam  General: awake, alert X 3  Cardiovascular: regular rate and rhythm, no murmurs, rubs or gallops  Lungs: clear to auscultation bilaterally, without wheezing, rhonchi, or rales  Abdomen: soft, non-tender, non-distended; no palpable masses,  normoactive bowel sounds  Extremities: 1+ bilateral lower extremity edema         Meds:     Medications were reviewed:  Current Facility-Administered Medications   Medication Dose Route Frequency    albuterol -ipratropium  3 mL Nebulization Q6H WA    allopurinol   300 mg Oral Daily    amLODIPine   5 mg Oral Daily    atorvastatin   40 mg Oral Daily    [START ON 09/12/2023] azithromycin   500 mg Oral Q24H    cefepime   2 g  Intravenous Q8H    fluticasone  furoate-vilanterol  1 puff Inhalation QAM    furosemide   20 mg Oral Daily    hydroCHLOROthiazide   25 mg Oral Daily    isosorbide  mononitrate  30 mg Oral Daily    linezolid   600 mg Oral Q12H SCH    lisinopril   40 mg Oral Daily    metoprolol  succinate  100 mg Oral Daily     Infusion Meds[1]  PRN Medications[2]      Labs:     Labs (last 72 hours):    Recent Labs   Lab 09/11/23  1405 09/11/23  0617   WBC 17.55* 16.11*   Hemoglobin 13.7 13.2   Hematocrit 42.0 39.6   Platelet Count 340 325       Recent Labs   Lab 09/11/23  1405 09/11/23  0617   PT 33.5* 31.0*   INR 3.0 2.8    Recent Labs   Lab 09/11/23  0617 09/10/23  1946   Sodium 141 140   Potassium 4.1 3.9   Chloride 103 101   CO2 23 26   BUN 28 29*   Creatinine 0.9 0.9   Calcium  9.5 10.5*   Albumin  2.4* 3.0*   Protein, Total 6.5 8.2   Bilirubin, Total 0.6 0.8   Alkaline Phosphatase 141* 192*   ALT 17 25   AST (SGOT) 43* 36   Glucose 188* 220*                   Microbiology, reviewed and are significant for:  Microbiology Results (last 15 days)       Procedure Component Value Units Date/Time    Urine Streptococcus pneumoniae Antigen [[id number]] Collected: 09/11/23 0602    Order Status: Sent Specimen: Urine, Clean Catch Updated: 09/11/23 0646    Urine Legionella pneumophila serogroup 1 Antigen [[id number]] Collected: 09/11/23 0602    Order Status: Sent Specimen: Urine, Clean Catch Updated: 09/11/23 0646    Nares, MRSA (methicillin-resistant Staphylococcus aureus) Screening, PCR [[id number]]  (Normal) Collected: 09/11/23 0143    Order Status: Completed Specimen: Swab from Nares Updated: 09/11/23 0426     MRSA (methicillin resistant  Staphylococcus aureus) DNA Not Detected     Comment: Testing performed with the Xpert MRSA NxG assay. This test is intended for the detection of methicillin-resistant Staphylococcus aureus (MRSA) DNA. This test is not intended to diagnose, guide, or monitor treatment for MRSA infections, or provide results of  susceptibility to methicillin. A positive test result does not necessarily indicate the presence of viable organisms. A negative result does not preclude MRSA nasal colonization.       Culture, Sputum and Lower Respiratory [[id number]]     Order Status: Sent Specimen: Sputum, Expectorated     COVID-19 and Influenza ([place]) (symptomatic) [[id number]]  (Normal) Collected: 09/10/23 1946    Order Status: Completed Specimen: Swab from Anterior Nares Updated: 09/10/23 2019     SARS-CoV-2 (COVID-19) RNA Not Detected     Influenza A RNA Not Detected     Influenza B RNA Not Detected    Narrative:      A result of Detected indicates POSITIVE for the presence of viral RNA  A result of Not Detected indicates NEGATIVE for the presence of viral RNA    Test performed using the Roche cobas [place] SARS-CoV-2 & Influenza A/B assay. This is a multiplex real-time RT-PCR assay for the detection of SARS-CoV-2, influenza A, and influenza B virus RNA. Viral nucleic acids may persist in vivo, independent of viability. Detection of viral nucleic acid does not imply the presence of infectious virus, or that virus nucleic acid is the cause of clinical symptoms. Negative results do not preclude SARS-CoV-2, influenza A, and/or influenza B infection and should not be used as the sole basis for diagnosis, treatment or other patient management decisions. Invalid results may be due to inhibiting substances in the specimen and recollection should occur.     Culture, Blood, Aerobic And Anaerobic [[id number]] Collected: 09/10/23 1946    Order Status: Resulted Specimen: Blood, Venous Updated: 09/10/23 1955            Imaging, reviewed and are significant for:    Portable chest x-ray 09/10/2023  Moderate left pleural effusion, increased compared to December 18.    CT Angiogram Chest 09/10/2023  1.No acute pulmonary emboli.  2.Increased moderate to large left pleural effusion with associated  compressive atelectasis of the left lower lobe and  lingula.  3.Multiple enlarging pulmonary nodules and enlarging left anterior  cardiophrenic mass/nodal mass, highly suspicious for metastases.      Signed by: Adylin Hankey Eleonor M Isabela Nardelli, MD             [1]   Current Facility-Administered Medications   Medication Dose Route Frequency Last Rate   [2]   Current Facility-Administered Medications   Medication Dose Route    acetaminophen   650 mg Oral    benzocaine -menthol   1 lozenge Buccal    benzonatate   100 mg Oral    carboxymethylcellulose sodium  1 drop Both Eyes    dextrose   15 g of glucose Oral    Or    dextrose   12.5 g Intravenous    Or    dextrose   12.5 g Intravenous    Or    glucagon  (rDNA)  1 mg Intramuscular    magnesium  sulfate  1 g Intravenous    melatonin  3 mg Oral    naloxone   0.2 mg Intravenous    potassium & sodium phosphates   2 packet Oral    potassium chloride   0-60 mEq Oral    Or    potassium chloride   0-60 mEq Oral  Or    potassium chloride   10 mEq Intravenous    saline  2 spray Each Nare

## 2023-09-11 NOTE — Nursing Progress Note (Signed)
 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     Last 24 hrs ( any critical events):  Admitted from ED accompanied by son, Patient is alert and oriented, independent, denies pain, on 3L O2 via NC, dyspneic on exertion. Due antibiotics given, MRSA swab taken pending result. VSS. Safety and fall precautions in placed.     Abnormal labs/VS(needing any orders): VS WNL.  [phone]- Labs pending result    Respiratory (changes in oxygenation): 3L O2 via NC    GI/GU(changes in output, BM,foley): continent x2. Urinary frequency d/t lasix     Neuro changes: Alert and oriented x4    Nutrition: Regular Diet    Activity/ Safety/mobility questions (need for PT/OT,sitter): Independent. Standby assist OOB    LDA access issues, line removal: PIV G20 Left AC    Weight trend if applicable:     Tele/Rhythm- if changes: not on tele    Skin/Wounds-if applicable: Scattered bruises    VTE Prophylaxis:     Upcoming procedures: Pending sputum and urine collection    Discharge date/plans: TBD    Questions/orders needed:

## 2023-09-11 NOTE — Progress Notes (Signed)
 Respiratory Therapy Patient Assessment    FO569/FO569.01  09/11/23 1:29 PM  RT: Dorn FORBES Mania, RT      Admitting DX: Hypoxia [R09.02]  Pneumonia of left lower lobe due to infectious organism [J18.9]  Sepsis, due to unspecified organism, unspecified whether acute organ dysfunction present [A41.9]    Pulmonary History: None    Other Pulm Hx:      Therapy ordered:       Respiratory Orders   (From admission, onward)                 Start     Ordered    09/11/23 0800  Resp Re-Assess Adult (RT Use Only)  2 times daily (RT)       09/11/23 9451    09/11/23 0549  Nebulizer treatment intermittent  Every 4 hours scheduled (RT)      Comments:   All Adult patients ordered for Respiratory Therapy, i.e., inhaled meds, secretion clearance/lung expansion or Oxygen greater than 5 liters/min will be evaluated by a Respiratory Therapist and assessed per Respiratory Therapy Patient Driven Protocol.  Initial assessment and changes made per protocol can be found in the progress note section of the patient chart.    09/11/23 9451    Unscheduled  MDI/DPI  RN to Administer      Comments:   All Adult patients ordered for Respiratory Therapy, i.e., inhaled meds, secretion clearance/lung expansion or Oxygen greater than 5 liters/min will be evaluated by a Respiratory Therapist and assessed per Respiratory Therapy Patient Driven Protocol.  Initial assessment and changes made per protocol can be found in the progress note section of the patient chart.    09/11/23 0548                   IP Meds - Nasal and Inhaled (From admission, onward)      Start     Stop Status Route Frequency Ordered    09/11/23 0900  fluticasone  furoate-vilanterol (BREO ELLIPTA ) 200-25 MCG/ACT 1 puff         -- Dispensed IN RT - Every morning 09/11/23 0042    09/11/23 0000  albuterol -ipratropium (DUO-NEB) 2.5-0.5(3) mg/3 mL nebulizer 3 mL         -- Dispensed NEBULIZATION RT - Every 4 hours scheduled 09/10/23 2322             Subjective: IM A LITTLSHORT OF BREATH PT able  to take deep breath? Yes            Surgical Status: None  Airway: Natural   Mobility: Ambulatory with or without assistance  CXR: Moderate left pleural effusion    Cough Effort: Strong            Can clear secretions with cough? Yes  Can clear secretions with suctioning? N/A     Tobacco Use History[1]     Breath Sounds:  Bilateral Breath Sounds: Diminished  R Breath Sounds: Diminished  L Breath Sounds: Diminished    Heart Rate: (!) 102 Resp Rate: 18  SpO2: 96 % O2 Device: Nasal cannula     O2 Flow Rate (L/min): 3 L/min    Home regimen:  Home Treatments: y (alb MDI prn)  Home Oxygen: no   Home CPAP/BiLevel: no    Criteria for therapy:  Secretion Clearance: None indicated  Lung Expansion: None indicated  Medications: None indicated    Recommendations/Interventions:  Recommendations/Interventions: Bronchodilators     Expected Outcomes:        Meds:  Return to baseline home regimen      Re-Evaluation:  Follow-up Date:   Improving with Therapy: N/A    Plan of Care Recommendations:  Plan of Care: change the Duoneb to Q6WA with RN providing        [1]   Social History  Tobacco Use   Smoking Status Former    Types: Cigarettes   Smokeless Tobacco Not on file   Tobacco Comments    Quit smoking June 1989, smoked 30 years before

## 2023-09-11 NOTE — Plan of Care (Addendum)
 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      Last 24 hrs ( any critical events): Patient is alert and oriented, independent, denies pain, on 3L O2 via NC, dyspneic on exertion. Due antibiotics given, VSS. Safety and fall precautions in placed. Patient son at bedside.      Abnormal labs/VS(needing any orders): WBC 16.11  Respiratory (changes in oxygenation): 3L O2 via NC     GI/GU(changes in output, BM,foley): continent x2. Urinary frequency d/t lasix , LBM prior to admission     Neuro changes: Alert and oriented x4     Nutrition: Regular Diet     Activity/ Safety/mobility questions (need for PT/OT,sitter): Independent. Standby assist OOB     LDA access issues, line removal: PIV G20 Left AC     Weight trend if applicable:      Tele/Rhythm- if changes: not on tele     Skin/Wounds-if applicable: Scattered bruises     VTE Prophylaxis:      Upcoming procedures: Pending sputum collection      Discharge date/plans: TBD     Questions/orders needed:   Problem: Moderate/High Fall Risk Score >5  Goal: Patient will remain free of falls  Outcome: Progressing  Flowsheets (Taken 09/11/2023 1012)  Moderate Risk (6-13):   MOD- Consider video monitoring   MOD-Use gait belt when appropriate   MOD-Perform dangle, stand, walk (DSW) prior to mobilization   MOD-Re-orient confused patients   MOD-Remain with patient during toileting     Problem: Compromised Sensory Perception  Goal: Sensory Perception Interventions  Outcome: Progressing  Flowsheets (Taken 09/11/2023 1012)  Sensory Perception Interventions: Offload heels, Pad bony prominences, Reposition q 2hrs/turn Clock, Q2 hour skin assessment under devices if present     Problem: Compromised Activity/Mobility  Goal: Activity/Mobility Interventions  Outcome: Progressing  Flowsheets (Taken 09/11/2023 1012)  Activity/Mobility Interventions: Pad  bony prominences, TAP Seated positioning system when OOB, Promote PMP, Reposition q 2 hrs / turn clock, Offload heels     Problem: Inadequate Gas Exchange  Goal: Adequate oxygenation and improved ventilation  Outcome: Progressing  Flowsheets (Taken 09/11/2023 1012)  Adequate oxygenation and improved ventilation:   Assess lung sounds   Provide mechanical and oxygen support to facilitate gas exchange   Monitor SpO2 and treat as needed   Monitor and treat ETCO2   Position for maximum ventilatory efficiency  Goal: Patent Airway maintained  Outcome: Progressing  Flowsheets (Taken 09/11/2023 1012)  Patent airway maintained:   Position patient for maximum ventilatory efficiency   Provide adequate fluid intake to liquefy secretions   Suction secretions as needed

## 2023-09-11 NOTE — Plan of Care (Addendum)
 4 eyes in 4 hours pressure injury assessment note:      Completed with: MARGARETH, RN  Unit & Time admitted: 5O,2300H             Bony Prominences: Check appropriate box; if wound is present enter wound assessment in LDA     Occiput:                 [x] WNL  []  Wound present  Face:                     [x] WNL  []  Wound present  Ears:                      [x] WNL  []  Wound present  Spine:                    [x] WNL  []  Wound present  Shoulders:             [x] WNL  []  Wound present  Elbows:                  [x] WNL  []  Wound present  Sacrum/coccyx:     [x] WNL  []  Wound present  Ischial Tuberosity:  [x] WNL  []  Wound present  Trochanter/Hip:      [x] WNL  []  Wound present  Knees:                   [x] WNL  []  Wound present  Ankles:                   [x] WNL  []  Wound present  Heels:                    [x] WNL  []  Wound present  Other pressure areas:  []  Wound location       Device related: []  Device name:         LDA completed if wound present: yes/no  Consult WOCN if necessary    Other skin related issues, ie tears, rash, etc, document in Integumentary flowsheet       Problem: Moderate/High Fall Risk Score >5  Goal: Patient will remain free of falls  Flowsheets  Taken 09/10/2023 2300 by Claude Evalene Marit JONELLE, RN  Moderate Risk (6-13):   MOD-Consider activation of bed alarm if appropriate   MOD-Apply bed exit alarm if patient is confused   MOD-Floor mat at bedside (where available) if appropriate   MOD-Consider a move closer to Nurses Station   MOD-Remain with patient during toileting   MOD-Place bedside commode and assistive devices out of sight when not in use   MOD-Re-orient confused patients   MOD-Utilize diversion activities   MOD-Perform dangle, stand, walk (DSW) prior to mobilization   MOD-Request PT/OT consult order for patients with gait/mobility impairment   MOD-Use gait belt when appropriate   MOD-include family in multidisciplinary POC discussions   MOD- Consider video monitoring  Taken 09/10/2023 1923 by Neysa Editha HERO, RN  High (Greater than 13):   HIGH-Consider use of low bed   HIGH-Apply yellow Fall Risk arm band     Problem: Compromised Sensory Perception  Goal: Sensory Perception Interventions  Flowsheets (Taken 09/10/2023 2300)  Sensory Perception Interventions: Offload heels, Pad bony prominences, Reposition q 2hrs/turn Clock, Q2 hour skin assessment under devices if present     Problem: Compromised Activity/Mobility  Goal: Activity/Mobility Interventions  Flowsheets (Taken 09/10/2023 2300)  Activity/Mobility Interventions: Pad bony prominences,  TAP Seated positioning system when OOB, Promote PMP, Reposition q 2 hrs / turn clock, Offload heels     Problem: Inadequate Gas Exchange  Goal: Adequate oxygenation and improved ventilation  Flowsheets (Taken 09/11/2023 0303)  Adequate oxygenation and improved ventilation:   Assess lung sounds   Monitor SpO2 and treat as needed   Position for maximum ventilatory efficiency   Provide mechanical and oxygen support to facilitate gas exchange   Increase activity as tolerated/progressive mobility   Teach/reinforce use of incentive spirometer 10 times per hour while awake, cough and deep breath as needed   Consult/collaborate with Respiratory Therapy   Plan activities to conserve energy: plan rest periods  Goal: Patent Airway maintained  Reactivated

## 2023-09-11 NOTE — Consults (Signed)
 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 Paroxysmal atrial fib-on coumadin  for 20 years  4. CAD-s/p CABG in 2005  5. OSA-not on CPAP currently  6. HTN/Hyperlipidemia/DM  7. Family history of HIT, son, not pt  8. Elevated lactic acid but not sure he really has an infection-short term antibiotics for now    Recommendations:   Would reverse INR   If using heparin  watch for HIT  Will tap once INR is less than 1.5  4. Continue antibiotics for now but if ID workup negative would stop      The case was discussed with team.    The patient presents complex issues, as noted above, and requires continued hospitalization and monitoring given the risk of deterioration.        Thank you for allowing us  to participate in the care of this patient. We will follow along with you.         History:   Carl [name] is a 83 y.o. male admitted on 09/10/2023.  We have been asked by Dr. Gailen, to provide pulmonary consultation regarding pleural effusion    Patient with smoking history -2-3 packs per day x 20 years, quit 35 years ago. Has always had a rattle in chest per son. He developed increase in cough and dyspnea around Thanksgiving. Was given antibiotics x 2 and saw Dr. Loyde who prescribed dulera and albuterol . He had a CT chest on 09/01/23 which looks like it is not read yet.     Came in yesterday with worsening dyspnea. No fever, does have a  cough. No purulent sputum.     CT shows mediastinal mass, several nodules which are a bit larger than on previous scan and left effusion which is larger than on Dec 27.     Pt on coumadin  for paroxysmal atrial fib and INR today is 2.8    Of note, son has history of HIT. Patient has not had a problem with heparin  to his knowledge      Past Medical History:     Past Medical History:   Diagnosis Date    Diabetes mellitus     Hyperlipidemia     Hypertension        Past Surgical History:   Past Surgical History[1]    Family History:   Family History[2]    Social History:     Social History[3]       Allergies:   Allergies[4]    Medications:     Facility-Administered Medications Prior to Admission   Medication    [COMPLETED] levalbuterol  (XOPENEX ) nebulizer solution 1.25 mg     Medications Prior to Admission   Medication Sig    allopurinol  (ZYLOPRIM ) 300 MG tablet Take 1 tablet (300 mg) by mouth daily    amLODIPine  (NORVASC ) 5 MG tablet Take 1 tablet (5 mg) by mouth daily    atorvastatin  (LIPITOR) 40 MG tablet Take 1  tablet (40 mg) by mouth daily    chlorthalidone  25 MG tablet Take 0.5 tablets (12.5 mg) by mouth daily    clobetasol (TEMOVATE) 0.05 % cream Apply topically as needed    cloNIDine  (CATAPRES ) 0.2 MG tablet Take 1 tablet (0.2 mg) by mouth 2 (two) times daily    fluticasone  furoate-vilanterol (BREO ELLIPTA ) 200-25 MCG/ACT Aerosol Pwdr, Breath Activated Inhale 1 puff into the lungs daily    furosemide  (LASIX ) 20 MG tablet Take 1 tablet (20 mg) by mouth daily    glipiZIDE  (GLUCOTROL  XL) 5 MG 24 hr tablet Take 1 tablet (5 mg) by mouth daily    Glucosamine-Chondroit-Vit C-Mn (GLUCOSAMINE 1500 COMPLEX PO) Take 2 tablets by mouth daily    isosorbide  mononitrate (IMDUR ) 30 MG 24 hr tablet Take 1 tablet (30 mg) by mouth daily    lisinopril  (ZESTRIL ) 40 MG tablet Take 1 tablet (40 mg) by mouth daily    metFORMIN  (GLUCOPHAGE -XR) 500 MG 24 hr tablet Take 2 tablets (1,000 mg) by mouth 2 (two) times daily    metoprolol   succinate (TOPROL -XL) 100 MG 24 hr tablet Take 1 tablet (100 mg) by mouth daily    Vitamin D, Cholecalciferol, 50 MCG (2000 UT) Cap Take 2,000 Units by mouth daily    warfarin (COUMADIN ) 5 MG tablet Take 1.5 tablets (7.5 mg) by mouth every evening        Current Facility-Administered Medications   Medication Dose Route Frequency    albuterol -ipratropium  3 mL Nebulization Q4H SCH    allopurinol   300 mg Oral Daily    amLODIPine   5 mg Oral Daily    atorvastatin   40 mg Oral Daily    azithromycin   500 mg Intravenous Q24H    cefepime   2 g Intravenous Q8H    fluticasone  furoate-vilanterol  1 puff Inhalation QAM    furosemide   20 mg Oral Daily    hydroCHLOROthiazide   25 mg Oral Daily    isosorbide  mononitrate  30 mg Oral Daily    linezolid   600 mg Oral Q12H SCH    lisinopril   40 mg Oral Daily    metoprolol  succinate  100 mg Oral Daily         Review of Systems:    Comprehensive review of systems including constitutional, eyes, ears, nose, mouth, throat, cardiovascular, GI, GU, musculoskeletal, integumentary, respiratory, neurologic, psychiatric, and endocrine is negative other than what is mentioned already in the history of present illness    Physical Exam:     VITAL SIGNS PHYSICAL EXAM   Vitals:    09/11/23 0812   BP: 120/80   Pulse: 90   Resp: 17   Temp: 97.8 F (36.6 C)   SpO2: 96%     Temp (24hrs), Avg:97.9 F (36.6 C), Min:97.3 F (36.3 C), Max:98.7 F (37.1 C)          Intake/Output Summary (Last 24 hours) at 09/11/2023 1003  Last data filed at 09/10/2023 2300  Gross per 24 hour   Intake 100 ml   Output 300 ml   Net -200 ml           SpO2: 96 % (09/11/2023  8:12 AM)  O2 Device: Nasal cannula (09/11/2023  8:12 AM)  O2 Flow Rate (L/min): 3 L/min (09/11/2023  8:12 AM)     Physical Exam  General: awake, alert, breathing comfortably, no acute distress  Head: normocephalic  Eyes: EOM's intact  Cardiovascular: regular rate and rhythm, normal S1, S2, no S3, no S4, no murmurs, rubs  or gallops  Neck: No JVD  Lungs: decreased breath  sounds and dull 1/2 up on left  Abdomen: soft, non-tender, non-distended; no palpable masses,  normoactive bowel sounds  Extremities: plus one edema to ankles bilat  Pulse: equal pulses,  Neurological: Alert and oriented X3, mood and affect normal  Musculoskeletal: normal strength and tone     Labs Reviewed:     Results       Procedure Component Value Units Date/Time    Comprehensive Metabolic Panel [[id number]]  (Abnormal) Collected: 09/11/23 0617    Specimen: Blood, Venous Updated: 09/11/23 0858     Glucose 188 mg/dL      BUN 28 mg/dL      Creatinine 0.9 mg/dL      Sodium 858 mEq/L      Potassium 4.1 mEq/L      Chloride 103 mEq/L      CO2 23 mEq/L      Calcium  9.5 mg/dL      Anion Gap 84.9     GFR >60.0 mL/min/1.73 m2      AST (SGOT) 43 U/L      ALT 17 U/L      Alkaline Phosphatase 141 U/L      Albumin  2.4 g/dL      Protein, Total 6.5 g/dL      Globulin 4.1 g/dL      Albumin /Globulin Ratio 0.6     Bilirubin, Total 0.6 mg/dL     PT/INR [[id number]]  (Abnormal) Collected: 09/11/23 0617    Specimen: Blood, Venous Updated: 09/11/23 0701     PT 31.0 sec      INR 2.8    CBC without Differential [[id number]]  (Abnormal) Collected: 09/11/23 0617    Specimen: Blood, Venous Updated: 09/11/23 0648     WBC 16.11 x10 3/uL      Hemoglobin 13.2 g/dL      Hematocrit 60.3 %      Platelet Count 325 x10 3/uL      MPV 11.9 fL      RBC 4.33 x10 6/uL      MCV 91.5 fL      MCH 30.5 pg      MCHC 33.3 g/dL      RDW 14 %      nRBC % 0.0 /100 WBC      Absolute nRBC 0.00 x10 3/uL     Urine Streptococcus pneumoniae Antigen [[id number]] Collected: 09/11/23 0602    Specimen: Urine, Clean Catch Updated: 09/11/23 0646    Urine Legionella pneumophila serogroup 1 Antigen [[id number]] Collected: 09/11/23 0602    Specimen: Urine, Clean Catch Updated: 09/11/23 0646    Nares, MRSA (methicillin-resistant Staphylococcus aureus) Screening, PCR [[id number]]  (Normal) Collected: 09/11/23 0143    Specimen: Swab from Nares Updated: 09/11/23 0426     MRSA  (methicillin resistant Staphylococcus aureus) DNA Not Detected    Lactic Acid [[id number]]  (Abnormal) Collected: 09/10/23 2214    Specimen: Blood, Venous Updated: 09/10/23 2223     Whole Blood Lactic Acid 2.5 mmol/L     PT/INR [[id number]]  (Abnormal) Collected: 09/10/23 1946    Specimen: Blood, Venous Updated: 09/10/23 2023     PT 31.9 sec      INR 2.9    NT-ProBNP [[id number]]  (Abnormal) Collected: 09/10/23 1946    Specimen: Blood, Venous Updated: 09/10/23 2022     NT-ProBNP 1,059 pg/mL     High Sensitivity Troponin-I [[id number]]  (Normal) Collected: 09/10/23 1946    Specimen: Blood, Venous Updated: 09/10/23  2022     hs Troponin 8.0 ng/L     Comprehensive Metabolic Panel [[id number]]  (Abnormal) Collected: 09/10/23 1946    Specimen: Blood, Venous Updated: 09/10/23 2020     Glucose 220 mg/dL      BUN 29 mg/dL      Creatinine 0.9 mg/dL      Sodium 859 mEq/L      Potassium 3.9 mEq/L      Chloride 101 mEq/L      CO2 26 mEq/L      Calcium  10.5 mg/dL      Anion Gap 86.9     GFR >60.0 mL/min/1.73 m2      AST (SGOT) 36 U/L      ALT 25 U/L      Alkaline Phosphatase 192 U/L      Albumin  3.0 g/dL      Protein, Total 8.2 g/dL      Globulin 5.2 g/dL      Albumin /Globulin Ratio 0.6     Bilirubin, Total 0.8 mg/dL     RNCPI-80 and Influenza (Liat) (symptomatic) [[id number]]  (Normal) Collected: 09/10/23 1946    Specimen: Swab from Anterior Nares Updated: 09/10/23 2019     SARS-CoV-2 (COVID-19) RNA Not Detected     Influenza A RNA Not Detected     Influenza B RNA Not Detected    Narrative:      A result of Detected indicates POSITIVE for the presence of viral RNA  A result of Not Detected indicates NEGATIVE for the presence of viral RNA    Test performed using the Roche cobas Liat SARS-CoV-2 & Influenza A/B assay. This is a multiplex real-time RT-PCR assay for the detection of SARS-CoV-2, influenza A, and influenza B virus RNA. Viral nucleic acids may persist in vivo, independent of viability. Detection of viral nucleic  acid does not imply the presence of infectious virus, or that virus nucleic acid is the cause of clinical symptoms. Negative results do not preclude SARS-CoV-2, influenza A, and/or influenza B infection and should not be used as the sole basis for diagnosis, treatment or other patient management decisions. Invalid results may be due to inhibiting substances in the specimen and recollection should occur.     CBC with Differential (Order) [[id number]]  (Abnormal) Collected: 09/10/23 1946    Specimen: Blood, Venous Updated: 09/10/23 2006    Narrative:      The following orders were created for panel order CBC with Differential (Order).  Procedure                               Abnormality         Status                     ---------                               -----------         ------                     CBC with Differential (...[[id number]]  Abnormal            Final result                 Please view results for these tests on the individual orders.    CBC with Differential (Component) [[id number]]  (Abnormal)  Collected: 09/10/23 1946    Specimen: Blood, Venous Updated: 09/10/23 2006     WBC 15.16 x10 3/uL      Hemoglobin 15.7 g/dL      Hematocrit 52.8 %      Platelet Count 367 x10 3/uL      MPV 11.6 fL      RBC 5.12 x10 6/uL      MCV 92.0 fL      MCH 30.7 pg      MCHC 33.3 g/dL      RDW 14 %      nRBC % 0.0 /100 WBC      Absolute nRBC 0.00 x10 3/uL      Preliminary Absolute Neutrophil Count 11.98 x10 3/uL      Neutrophils % 79.0 %      Lymphocytes % 13.4 %      Monocytes % 6.7 %      Eosinophils % 0.2 %      Basophils % 0.2 %      Immature Granulocytes % 0.5 %      Absolute Neutrophils 11.98 x10 3/uL      Absolute Lymphocytes 2.03 x10 3/uL      Absolute Monocytes 1.02 x10 3/uL      Absolute Eosinophils 0.03 x10 3/uL      Absolute Basophils 0.03 x10 3/uL      Absolute Immature Granulocytes 0.07 x10 3/uL     Lactic Acid [[id number]]  (Abnormal) Collected: 09/10/23 1946    Specimen: Blood, Venous Updated: 09/10/23  1956     Whole Blood Lactic Acid 3.2 mmol/L     Culture, Blood, Aerobic And Anaerobic [[id number]] Collected: 09/10/23 1946    Specimen: Blood, Venous Updated: 09/10/23 1955            I personally reviewed the labs         Imaging:     Outside imaging and imaging available in Epic were directly reviewed        Radiology Results (24 Hour)       Procedure Component Value Units Date/Time    CT Angio Chest (PE or trauma protocol) [[id number]] Collected: 09/11/23 0053    Order Status: Completed Updated: 09/11/23 0110    Narrative:      HISTORY: Shortness of breath. High risk for pulmonary embolism.    COMPARISON: CT chest 09/01/2023; 02/13/2023    TECHNIQUE: CTA chest WITH intravenous contrast. PE protocol. 3D MIP images  are submitted and reviewed. The following dose reduction techniques were  utilized: Automated exposure control and/or adjustment of the mA and/or kV  according to patient size, and the use of iterative reconstruction  technique.    CONTRAST: iohexol  (OMNIPAQUE ) 350 MG/ML injection 80 mL.     FINDINGS:     LINES/TUBES: None.    LUNGS: There is partial compressive atelectasis of the left lower lobe and  lingula. There are multiple enlarging nodules compared to CT from December  2024 that are new compared to CT from June 2024. There is a subpleural  nodule (image 251, series 4) at the posterior right lower lobe measuring  1.0 cm, previously 0.8 cm. A 0.7 cm nodule at the inferior anterior right  upper lobe previously measured 0.5 cm. Multiple other smaller bilateral  pulmonary nodules are seen measuring up to 0.8 cm.  PLEURA: Increased moderate to large left pleural effusion. No pneumothorax.    HEART: Not enlarged.  MEDIASTINUM: There is a soft tissue mass/nodal mass in the left anterior  cardiophrenic fat measuring 5.9 x 4.0 cm, previously 5.0 x 2.9 cm on CT of  December 2024 and new compared to CT from June 2024.  PULMONARY ARTERIES: No acute pulmonary emboli.  AORTA: No aneurysm or  dissection.    UPPER ABDOMEN: Unremarkable.    BONES AND SOFT TISSUES: There are degenerative changes in the spine. A  nonspecific lytic lesion with a thin sclerotic rim is seen within the T5  vertebral body, favor benign.      Impression:        1.No acute pulmonary emboli.  2.Increased moderate to large left pleural effusion with associated  compressive atelectasis of the left lower lobe and lingula.  3.Multiple enlarging pulmonary nodules and enlarging left anterior  cardiophrenic mass/nodal mass, highly suspicious for metastases.    Garnette Rouse, MD  09/11/2023 1:08 AM    XR Chest  AP Portable [[id number]] Collected: 09/10/23 2003    Order Status: Completed Updated: 09/10/23 2006    Narrative:      HISTORY: cough.      COMPARISON: 08/23/2023.    FINDINGS:     Cardiomediastinal contours are normal.    Moderate left pleural effusion, increased compared to prior.    Right lung is clear. No pneumothorax.    No acute osseous or soft tissue abnormality.      Impression:        Moderate left pleural effusion, increased compared to December 18.    Hildegard Bayard, MD  09/10/2023 8:04 PM                Signed by: Leeroy JAYSON Citizen, MD      [facility]  Spectra  [phone]             [1]   Past Surgical History:  Procedure Laterality Date    CARDIAC SURGERY  2004    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    SKIN BIOPSY      TONSILLECTOMY     [2]   Family History  Problem Relation Name Age of Onset    Lung cancer Brother     [3]   Social History  Tobacco Use    Smoking status: Former     Types: Cigarettes    Tobacco comments:     Quit smoking June 1989, smoked 30 years before   Vaping Use    Vaping status: Never Used   Substance Use Topics    Alcohol use: Not Currently    Drug use: Not Currently   [4]   Allergies  Allergen Reactions    Cephalexin Rash

## 2023-09-12 ENCOUNTER — Inpatient Hospital Stay: Payer: Medicare Other

## 2023-09-12 DIAGNOSIS — I4892 Unspecified atrial flutter: Secondary | ICD-10-CM

## 2023-09-12 DIAGNOSIS — J90 Pleural effusion, not elsewhere classified: Secondary | ICD-10-CM

## 2023-09-12 DIAGNOSIS — R0902 Hypoxemia: Secondary | ICD-10-CM

## 2023-09-12 DIAGNOSIS — C7802 Secondary malignant neoplasm of left lung: Secondary | ICD-10-CM

## 2023-09-12 DIAGNOSIS — J189 Pneumonia, unspecified organism: Secondary | ICD-10-CM

## 2023-09-12 DIAGNOSIS — I10 Essential (primary) hypertension: Secondary | ICD-10-CM

## 2023-09-12 DIAGNOSIS — C3492 Malignant neoplasm of unspecified part of left bronchus or lung: Secondary | ICD-10-CM

## 2023-09-12 LAB — BASIC METABOLIC PANEL
Anion Gap: 10 (ref 5.0–15.0)
BUN: 21 mg/dL (ref 9–28)
CO2: 27 meq/L (ref 17–29)
Calcium: 9.4 mg/dL (ref 7.9–10.2)
Chloride: 104 meq/L (ref 99–111)
Creatinine: 0.8 mg/dL (ref 0.5–1.5)
GFR: >60 mL/min/{1.73_m2} (ref >=60.0)
Glucose: 202 mg/dL — ABNORMAL HIGH (ref 70–100)
Potassium: 3.3 meq/L — ABNORMAL LOW (ref 3.5–5.3)
Sodium: 141 meq/L (ref 135–145)

## 2023-09-12 LAB — LAB USE ONLY - BODY FLUID CELL COUNT
Body Fluid Lymphocytes: 29 %
Body Fluid Mesothelial Cells: 1 %
Body Fluid Monocyte/Macrophage Cells: 15 %
Body Fluid Polymorphonuclear Cells: 55 % — ABNORMAL HIGH (ref <25)
Body Fluid RBC: 12000 /mm3
Body Fluid Total Nucleated Cells: 4291 /mm3
Body Fluid WBC: 3955 /mm3 — ABNORMAL HIGH (ref <500)

## 2023-09-12 LAB — MAGNESIUM: Magnesium: 1.7 mg/dL (ref 1.6–2.6)

## 2023-09-12 LAB — BODY FLUID GLUCOSE: Body Fluid Glucose: 274 mg/dL

## 2023-09-12 LAB — BODY FLUID PH: Body Fluid pH: 7.436

## 2023-09-12 LAB — BODY FLUID PROTEIN: Body Fluid Protein: 3.8 g/dL

## 2023-09-12 LAB — BODY FLUID LACTATE DEHYDROGENASE (LDH): Body Fluid LDH: 268 U/L

## 2023-09-12 LAB — PT/INR
INR: 1.5 (ref 0.9–1.1)
PT: 16.7 s — ABNORMAL HIGH (ref 10.1–12.9)

## 2023-09-12 LAB — PHOSPHORUS: Phosphorus: 2.9 mg/dL (ref 2.3–4.7)

## 2023-09-12 MED ORDER — DUPILUMAB 300 MG/2ML SC SOAJ
300.0000 mg | SUBCUTANEOUS | Status: AC
Start: 2023-09-12 — End: 2023-09-12
  Administered 2023-09-12: 300 mg via SUBCUTANEOUS

## 2023-09-12 NOTE — Procedures (Signed)
 Therapeutic/Diagnostic Thoracentesis Procedure Note    Pre-operative Diagnosis: L Pleural effusion    Post-operative Diagnosis: same    Indications: Pleural effusion, Diagnostic, Therapeutic    Procedure Details     Consent: Informed consent was obtained. Risks of the procedure were discussed including: infection, bleeding, pain, pneumothorax.  Time out was called prior to the initiation of the procedure.    Ultrasound was used to localize the ideal area for thoracentesis to be done.  Free-flowing pleural effusion fluid was noted over the L posterior hemithorax.     Under sterile conditions the patient was positioned. Chlorhexadine solution and sterile drapes were utilized.  1% buffered lidocaine  was used to anesthetize the 8th rib space. Fluid was obtained without any difficulties and minimal blood loss.  A dressing was applied to the wound and wound care instructions were provided.     Findings  1300 ml of clear yellow serous pleural fluid was obtained from the L side.    Fluid was drained until 1300 ml output was reached.    A sample was sent to Pathology for cytology and Microbiology for cell counts, chemistry analysis, as well as for infection analysis.    Complications:  None; patient tolerated the procedure well.          Condition: stable    Plan  A follow up chest x-ray was ordered.  Bed Rest for 1 hours.  Tylenol  650 mg. for pain.    Paz Fuentes PA-C

## 2023-09-12 NOTE — Plan of Care (Signed)
 Problem: Moderate/High Fall Risk Score >5  Goal: Patient will remain free of falls  Outcome: Progressing     Problem: Compromised Sensory Perception  Goal: Sensory Perception Interventions  Outcome: Progressing     Problem: Compromised Activity/Mobility  Goal: Activity/Mobility Interventions  Outcome: Progressing     Problem: Inadequate Gas Exchange  Goal: Adequate oxygenation and improved ventilation  Outcome: Progressing  Goal: Patent Airway maintained  Outcome: Progressing     Problem: Compromised Moisture  Goal: Moisture level Interventions  Outcome: Progressing

## 2023-09-12 NOTE — Progress Notes (Signed)
 MEDICINE PROGRESS NOTE    Date Time: 09/12/23 11:31 AM  Patient Name: Carl [name]  Attending Physician: Boneta Kingfisher, MD    Assessment / Plan:   83 year old male with past medical history significant for COPD, chronic emphysema, CAD, paroxysmal A-fib on warfarin, OSA, GERD, hypertension, hyperlipidemia, diabetes, presented to hospital on 1/5 with dyspnea, found to have hypoxic respiratory failure secondary to left lung pneumonia and left lung effusion    #Acute hypoxic respiratory failure, currently on 3 L of oxygen via nasal cannula, secondary to pneumonia and effusion, will try to wean    #Left lower lobe pneumonia, patient remains on cefepime  and azithromycin     #COPD, not in exacerbation, remains on Breo Ellipta , DuoNebs every 6 hours, [facility] is following as well. On dupixent  300 mg every two weeks, son is planning to bring it here today, will order as nonformulary    #Left pleural effusion, underwent thoracentesis by [facility] pulmonology on 1/7, 1.3 L was drained    #Paroxysmal A-fib, on warfarin as outpatient, he was started on heparin  drip due to planned thoracentesis.  Will start heparin  drip 6 hours after Thora which is this afternoon.  On metoprolol  for rate control    #CAD on metoprolol , atorvastatin     #HFpEF, on metoprolol  succinate, furosemide  20 mg daily, lisinopril  40 mg daily    #Essential hypertension on amlodipine  5 mg daily metoprolol  succinate 100 mg daily, lisinopril  40 mg p.o. daily, Imdur  30 mg daily    #Diabetes on sliding scale coverage    #Hypokalemia, potassium 3.3 this morning, received repletion    #Hypomagnesemia, magnesium  1.7, received repletion    For DVT prophylaxis, currently not on anticoagulation, INR 1.5 today  He is full code  Remains hospitalized for a day or 2  Son was updated this morning    Kingfisher Boneta, MD        Additional Diagnoses:                 Safety Checklist:     DVT prophylaxis:  CHEST guideline (See page [id number]) Chemical   Foley:    Rn Foley protocol Not present   IVs:  Peripheral IV   PT/OT: Ordered   Daily CBC & or Chem ordered:  SHM/ABIM guidelines (see #5) No   Reference for approximate charges of common labs: CBC auto diff - $76  BMP - $99  Mg - $79    Lines:     Patient Lines/Drains/Airways Status       Active PICC Line / CVC Line / PIV Line / Drain / Airway / Intraosseous Line / Epidural Line / ART Line / Line / Wound / Pressure Ulcer / NG/OG Tube       Name Placement date Placement time Site Days    Peripheral IV 09/10/23 22 G Diffusion Left Antecubital 09/10/23  1954  Antecubital  1                     Disposition: (Please see PAF column for Expected D/C Date)   Today's date: 09/12/2023  Admit Date: 09/10/2023  7:27 PM  LOS: 2  Clinical Milestones: copd, effusion   Anticipated discharge needs: tbd      Subjective     CC: Hypoxia    HPI/Subjective: 83 year old male with past medical history significant for COPD, chronic emphysema, CAD, paroxysmal A-fib on warfarin, OSA, GERD, hypertension, hyperlipidemia, diabetes, presented to hospital on 1/5 with dyspnea  At presentation blood pressure was 130/74 heart  rate 116 temperature 98.7 saturating 87% on room air  Blood work showed WBC of 15, hemoglobin of 15.7 platelet count 367  BMP was grossly unremarkable except blood glucose of 220, lactic acid of 3.2, BNP 1000 INR 2.9  Chest x-ray showed moderate left pleural effusion  Admitted to hospitalist service with acute hypoxic respiratory failure secondary to left-sided pneumonia and effusion, started on cefepime  and azithromycin   [facility] pul[facility]ology was consulted for possible thoracentesis  His warfarin was held, he was started on enoxaparin   Pulmonology recommended thoracentesis when INR is less than 1.5    On 1/7 patient was seen at 9:25 AM with nurse Ayda  No acute events noted overnight  This morning patient seems to be stable, denies chest pain, dyspnea, abdominal pain      Review of Systems:     As per HPI    Physical Exam:     VITAL SIGNS  PHYSICAL EXAM   Temp:  [97.3 F (36.3 C)-98.4 F (36.9 C)] 97.3 F (36.3 C)  Heart Rate:  [100-109] 104  Resp Rate:  [17-20] 20  BP: (116-147)/(69-80) 131/76  Blood Glucose:    Telemetry:     No intake or output data in the 24 hours ending 09/12/23 1131 Physical Exam  General: Well developed, well nourished, obese elderly male, in no apparent distress  Head and Neck: Atraumatic, normocephalic, PERRL, extraocular movements intact, no scleral icterus, wearing nasal cannula  Cardiovascular: Irregular rhythm, normal rate, no murmurs  Lungs: Decreased breath sounds at the bases bilaterally, no wheezing  Abdomen: Soft, non-tender, non-distended  Extremities: No lower extremity edema  Neuro: Awake, alert, oriented x3         Meds:     Medications were reviewed:  Current Facility-Administered Medications   Medication Dose Route Frequency    albuterol -ipratropium  3 mL Nebulization Q6H WA    allopurinol   300 mg Oral Daily    amLODIPine   5 mg Oral Daily    atorvastatin   40 mg Oral Daily    azithromycin   500 mg Oral Q24H    cefepime   2 g Intravenous Q8H    fluticasone  furoate-vilanterol  1 puff Inhalation QAM    furosemide   20 mg Oral Daily    hydroCHLOROthiazide   25 mg Oral Daily    isosorbide  mononitrate  30 mg Oral Daily    lisinopril   40 mg Oral Daily    metoprolol  succinate  100 mg Oral Daily     Infusion Meds[1]  PRN Medications[2]      Labs:     Labs (last 72 hours):    Recent Labs   Lab 09/11/23  1405 09/11/23  0617   WBC 17.55* 16.11*   Hemoglobin 13.7 13.2   Hematocrit 42.0 39.6   Platelet Count 340 325       Recent Labs   Lab 09/12/23  0354 09/11/23  1405   PT 16.7* 33.5*   INR 1.5 3.0    Recent Labs   Lab 09/12/23  0354 09/11/23  0617 09/10/23  1946   Sodium 141 141 140   Potassium 3.3* 4.1 3.9   Chloride 104 103 101   CO2 27 23 26    BUN 21 28 29*   Creatinine 0.8 0.9 0.9   Calcium  9.4 9.5 10.5*   Albumin   --  2.4* 3.0*   Protein, Total  --  6.5 8.2   Bilirubin, Total  --  0.6 0.8   Alkaline Phosphatase  --   141* 192*  ALT  --  17 25   AST (SGOT)  --  43* 36   Glucose 202* 188* 220*                   Microbiology, reviewed and are significant for:  Microbiology Results (last 15 days)       Procedure Component Value Units Date/Time    Culture And Gram Stain, Aerobic Bacteria, Wound/Tissue/Fluid [[id number]]     Order Status: Sent Specimen: Pleural Fluid from Pleural Cavity, Left     Culture and Gram Stain, Aerobic and Anaerobic Bacteria, Wound/Tissue/Fluid (Order) [[id number]]     Order Status: Sent Specimen: Pleural Fluid from Pleural Cavity, Left     Narrative:      The following orders were created for panel order Culture and Gram Stain, Aerobic and Anaerobic Bacteria, Wound/Tissue/Fluid (Order).  Procedure                               Abnormality         Status                     ---------                               -----------         ------                     Culture And Gram Stain,.SABRASABRA[[id number]]                                                   Please view results for these tests on the individual orders.    Urine Streptococcus pneumoniae Antigen [[id number]]  (Normal) Collected: 09/11/23 0602    Order Status: Completed Specimen: Urine, Clean Catch Updated: 09/11/23 2055     Urine Streptococcus pneumoniae Antigen Negative     Comment: This is a presumptive test for the direct qualitative detection of bacterial antigen. This test is not intended as a substitute for a gram stain and bacterial culture. Samples with extremely low levels of antigen may yield negative results.       Urine Legionella pneumophila serogroup 1 Antigen [[id number]]  (Normal) Collected: 09/11/23 0602    Order Status: Completed Specimen: Urine, Clean Catch Updated: 09/11/23 2053     Urine Legionella pneumophila serogroup 1 Antigen Negative     Comment: This test will not detect infections caused by other Legionella pneumophila serogroups or by other Legionella species. A negative antigen result does not exclude infection with  Legionella pneumophila serogroup 1.       Nares, MRSA (methicillin-resistant Staphylococcus aureus) Screening, PCR [[id number]]  (Normal) Collected: 09/11/23 0143    Order Status: Completed Specimen: Swab from Nares Updated: 09/11/23 0426     MRSA (methicillin resistant Staphylococcus aureus) DNA Not Detected     Comment: Testing performed with the Xpert MRSA NxG assay. This test is intended for the detection of methicillin-resistant Staphylococcus aureus (MRSA) DNA. This test is not intended to diagnose, guide, or monitor treatment for MRSA infections, or provide results of susceptibility to methicillin. A positive test result does not necessarily indicate the presence of viable organisms. A negative result does not preclude MRSA nasal  colonization.       Culture, Sputum and Lower Respiratory [[id number]]     Order Status: Sent Specimen: Sputum, Expectorated     COVID-19 and Influenza ([place]) (symptomatic) [[id number]]  (Normal) Collected: 09/10/23 1946    Order Status: Completed Specimen: Swab from Anterior Nares Updated: 09/10/23 2019     SARS-CoV-2 (COVID-19) RNA Not Detected     Influenza A RNA Not Detected     Influenza B RNA Not Detected    Narrative:      A result of Detected indicates POSITIVE for the presence of viral RNA  A result of Not Detected indicates NEGATIVE for the presence of viral RNA    Test performed using the Roche cobas [place] SARS-CoV-2 & Influenza A/B assay. This is a multiplex real-time RT-PCR assay for the detection of SARS-CoV-2, influenza A, and influenza B virus RNA. Viral nucleic acids may persist in vivo, independent of viability. Detection of viral nucleic acid does not imply the presence of infectious virus, or that virus nucleic acid is the cause of clinical symptoms. Negative results do not preclude SARS-CoV-2, influenza A, and/or influenza B infection and should not be used as the sole basis for diagnosis, treatment or other patient management decisions. Invalid results may be  due to inhibiting substances in the specimen and recollection should occur.     Culture, Blood, Aerobic And Anaerobic [[id number]] Collected: 09/10/23 1946    Order Status: Completed Specimen: Blood, Venous Updated: 09/11/23 2300     Culture Blood No growth at 1 day            Imaging, reviewed and are significant for:  CTA chest      Signed by: Darletta Bunting, MD        This note was generated within the EPIC EMR using Dragon medical speech recognition software and may contain inherent errors or omissions not intended by the user. Grammatical and punctuation errors, random word insertions, deletions, pronoun errors and incomplete sentences are occasional consequences of this technology due to software limitations. Not all errors are caught or corrected. Although every attempt is made to root out erroneus and incomplete transcription, the note may still not fully represent the intent or opinion of the author. If there are questions or concerns about the content of this note or information contained within the body of this dictation they should be addressed directly with the author for clarification. '       [1]   Current Facility-Administered Medications   Medication Dose Route Frequency Last Rate   [2]   Current Facility-Administered Medications   Medication Dose Route    acetaminophen   650 mg Oral    benzocaine -menthol   1 lozenge Buccal    benzonatate   100 mg Oral    carboxymethylcellulose sodium  1 drop Both Eyes    dextrose   15 g of glucose Oral    Or    dextrose   12.5 g Intravenous    Or    dextrose   12.5 g Intravenous    Or    glucagon  (rDNA)  1 mg Intramuscular    magnesium  sulfate  1 g Intravenous    melatonin  3 mg Oral    naloxone   0.2 mg Intravenous    potassium & sodium phosphates   2 packet Oral    potassium chloride   0-60 mEq Oral    Or    potassium chloride   0-60 mEq Oral    Or    potassium chloride   10 mEq Intravenous  saline  2 spray Each Nare

## 2023-09-12 NOTE — Plan of Care (Addendum)
 Pt A/Ox3-4, VSS,on 3LNC.   S/P thoracentesis, pt tolerated well, site remained CDI.  Pt denies chest pain , c/o mild dyspnea with exertion.  All safety precautions in place.   No acute events    Problem: Moderate/High Fall Risk Score >5  Goal: Patient will remain free of falls  Flowsheets  Taken 09/11/2023 2100 by Hyla Round, RN  Moderate Risk (6-13):   MOD-Consider activation of bed alarm if appropriate   MOD-Apply bed exit alarm if patient is confused   MOD-Floor mat at bedside (where available) if appropriate   MOD-Consider a move closer to Nurses Station   MOD-Remain with patient during toileting   MOD-Place bedside commode and assistive devices out of sight when not in use   MOD-Re-orient confused patients   MOD-Utilize diversion activities   MOD-Perform dangle, stand, walk (DSW) prior to mobilization   MOD-Request PT/OT consult order for patients with gait/mobility impairment   MOD-Use gait belt when appropriate   MOD-include family in multidisciplinary POC discussions   MOD- Consider video monitoring  Taken 09/10/2023 1923 by Neysa Editha HERO, RN  High (Greater than 13):   HIGH-Consider use of low bed   HIGH-Apply yellow Fall Risk arm band     Problem: Compromised Sensory Perception  Goal: Sensory Perception Interventions  Flowsheets (Taken 09/11/2023 2100 by Hyla Round, RN)  Sensory Perception Interventions: Offload heels, Pad bony prominences, Reposition q 2hrs/turn Clock, Q2 hour skin assessment under devices if present     Problem: Compromised Activity/Mobility  Goal: Activity/Mobility Interventions  Flowsheets (Taken 09/11/2023 2100 by Hyla Round, RN)  Activity/Mobility Interventions: Pad bony prominences, TAP Seated positioning system when OOB, Promote PMP, Reposition q 2 hrs / turn clock, Offload heels     Problem: Inadequate Gas Exchange  Goal: Adequate oxygenation and improved ventilation  Flowsheets (Taken 09/11/2023 2111 by Hyla Round, RN)  Adequate oxygenation and improved ventilation:    Assess lung sounds   Provide mechanical and oxygen support to facilitate gas exchange   Position for maximum ventilatory efficiency   Teach/reinforce use of incentive spirometer 10 times per hour while awake, cough and deep breath as needed   Increase activity as tolerated/progressive mobility   Plan activities to conserve energy: plan rest periods   Consult/collaborate with Respiratory Therapy  Goal: Patent Airway maintained  Flowsheets (Taken 09/11/2023 2111 by Hyla Round, RN)  Patent airway maintained:   Position patient for maximum ventilatory efficiency   Provide adequate fluid intake to liquefy secretions   Suction secretions as needed   Reinforce use of ordered respiratory interventions (i.e. CPAP, BiPAP, Incentive Spirometer, Acapella, etc.)   Reposition patient every 2 hours and as needed unless able to self-reposition

## 2023-09-12 NOTE — Progress Notes (Signed)
 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 Paroxysmal atrial fib-on coumadin  for 20 years  4. CAD-s/p CABG in 2005  5. OSA-not on CPAP currently  6. HTN/Hyperlipidemia/DM  7. Family history of HIT, son, not pt  8. Elevated lactic acid but not sure he really has an infection-short term antibiotics for now      Plan:   Therapeutic and diagnostic thoracentesis performed at bedside today  Follow-up pleural fluid studies  Antibiotics per ID  Wean O2 for sats greater than 90%        I spent 35 minutes with the patient today.    Aysa Larivee C Ventura Hollenbeck, PA  09/12/23  12:21 PM      Interval History     Interval History/24 hr. Events: N on 3 L NC satting 97%.    Review of Systems:   Reviewed and no changes      Physical Exam:     VITAL SIGNS PHYSICAL EXAM   Vitals:    09/12/23 1147   BP: 103/64   Pulse: 98   Resp: 20   Temp: 97.3 F (36.3 C)   SpO2: 97%     Temp (24hrs), Avg:97.8 F (36.6 C), Min:97.3 F (36.3 C), Max:98.4 F (36.9 C)      Intake and Output Summary (Last 24 hours) at Date Time  No intake or output data in the 24 hours ending 09/12/23 1221          SpO2: 97 % (09/12/2023 11:47 AM)  O2 Device: Nasal cannula (09/12/2023 11:47 AM)  O2 Flow Rate (L/min): 3 L/min (09/12/2023 11:47 AM)     Physical Exam  General: awake, alert, breathing comfortably, no acute distress  Cardiovascular: Irregularly irregular  Neck: No JVD  Lungs: Diminished over left mid and lower lung bases  Abdomen: soft, non-tender, non-distended; no palpable masses,  normoactive bowel sounds  Extremities: Trace edema BLE  Pulse: equal pulses,  Neurological: Alert and oriented X3, mood and  affect normal         Meds:     Current Facility-Administered Medications   Medication Dose Route Frequency    albuterol -ipratropium  3 mL Nebulization Q6H WA    allopurinol   300 mg Oral Daily    amLODIPine   5 mg Oral Daily    atorvastatin   40 mg Oral Daily    azithromycin   500 mg Oral Q24H    cefepime   2 g Intravenous Q8H    fluticasone  furoate-vilanterol  1 puff Inhalation QAM    furosemide   20 mg Oral Daily    hydroCHLOROthiazide   25 mg Oral Daily    isosorbide  mononitrate  30 mg Oral Daily    lisinopril   40 mg Oral Daily    metoprolol  succinate  100 mg Oral Daily       Labs:     Recent Labs   Lab 09/12/23  0354 09/11/23  1405 09/11/23  0617 09/10/23  1946   WBC  --  17.55* 16.11* 15.16*   RBC  --  4.56 4.33 5.12   Hemoglobin  --  13.7 13.2 15.7   Hematocrit  --  42.0 39.6 47.1   Glucose 202*  --  188* 220*   BUN 21  --  28 29*   Creatinine 0.8  --  0.9 0.9   Calcium  9.4  --  9.5 10.5*   Sodium 141  --  141 140   Potassium 3.3*  --  4.1 3.9   Chloride 104  --  103 101   CO2 27  --  23 26           Imaging:         Outside imaging and imaging available in Epic were directly reviewed     Radiology Results (24 Hour)       Procedure Component Value Units Date/Time    XR Chest AP Portable [[id number]] Resulted: 09/12/23 1144    Order Status: Sent Updated: 09/12/23 1204              Signed by: Sheril JAYSON Chang, PA    [facility]  Spectra  [phone]

## 2023-09-13 DIAGNOSIS — J410 Simple chronic bronchitis: Secondary | ICD-10-CM

## 2023-09-13 LAB — C-REACTIVE PROTEIN: C-Reactive Protein: 9.3 mg/dL — ABNORMAL HIGH (ref 0.0–1.1)

## 2023-09-13 LAB — PT/INR
INR: 1.3 (ref 0.9–1.1)
PT: 14.1 s — ABNORMAL HIGH (ref 10.1–12.9)

## 2023-09-13 LAB — CBC
Absolute nRBC: 0 10*3/uL (ref <=0.00)
Hematocrit: 39.6 % (ref 37.6–49.6)
Hemoglobin: 13.2 g/dL (ref 12.5–17.1)
MCH: 30.6 pg (ref 25.1–33.5)
MCHC: 33.3 g/dL (ref 31.5–35.8)
MCV: 91.9 fL (ref 78.0–96.0)
MPV: 12.6 fL — ABNORMAL HIGH (ref 8.9–12.5)
Platelet Count: 288 10*3/uL (ref 142–346)
RBC: 4.31 10*6/uL (ref 4.20–5.90)
RDW: 14 % (ref 11–15)
WBC: 14.36 10*3/uL — ABNORMAL HIGH (ref 3.10–9.50)
nRBC %: 0 /100{WBCs} (ref <=0.0)

## 2023-09-13 LAB — BASIC METABOLIC PANEL
Anion Gap: 4 — ABNORMAL LOW (ref 5.0–15.0)
BUN: 21 mg/dL (ref 9–28)
CO2: 27 meq/L (ref 17–29)
Calcium: 9.1 mg/dL (ref 7.9–10.2)
Chloride: 105 meq/L (ref 99–111)
Creatinine: 0.8 mg/dL (ref 0.5–1.5)
GFR: >60 mL/min/{1.73_m2} (ref >=60.0)
Glucose: 218 mg/dL — ABNORMAL HIGH (ref 70–100)
Potassium: 3.9 meq/L (ref 3.5–5.3)
Sodium: 136 meq/L (ref 135–145)

## 2023-09-13 LAB — WHOLE BLOOD GLUCOSE POCT
Whole Blood Glucose POCT: 289 mg/dL — ABNORMAL HIGH (ref 70–100)
Whole Blood Glucose POCT: 209 mg/dL — ABNORMAL HIGH (ref 70–100)
Whole Blood Glucose POCT: 257 mg/dL — ABNORMAL HIGH (ref 70–100)

## 2023-09-13 LAB — BODY FLUID PATH REVIEW

## 2023-09-13 LAB — PROCALCITONIN: Procalcitonin: 0.06 ng/mL (ref 0.00–0.10)

## 2023-09-13 MED ORDER — GLUCOSE 40 % PO GEL (WRAP)
15.0000 g | ORAL | Status: DC | PRN
Start: 2023-09-13 — End: 2023-09-20

## 2023-09-13 MED ORDER — INSULIN LISPRO 100 UNIT/ML SOLN (WRAP)
1.0000 [IU] | Freq: Three times a day (TID) | Status: DC
Start: 2023-09-13 — End: 2023-09-20
  Administered 2023-09-13: 1 [IU] via SUBCUTANEOUS
  Administered 2023-09-13: 3 [IU] via SUBCUTANEOUS
  Administered 2023-09-14: 2 [IU] via SUBCUTANEOUS
  Administered 2023-09-14: 4 [IU] via SUBCUTANEOUS
  Administered 2023-09-14 – 2023-09-15 (×2): 2 [IU] via SUBCUTANEOUS
  Administered 2023-09-15: 3 [IU] via SUBCUTANEOUS
  Administered 2023-09-15 – 2023-09-16 (×2): 2 [IU] via SUBCUTANEOUS
  Administered 2023-09-16: 4 [IU] via SUBCUTANEOUS
  Administered 2023-09-17 – 2023-09-18 (×3): 2 [IU] via SUBCUTANEOUS
  Administered 2023-09-18: 3 [IU] via SUBCUTANEOUS
  Administered 2023-09-20: 2 [IU] via SUBCUTANEOUS
  Administered 2023-09-20: 4 [IU] via SUBCUTANEOUS
  Filled 2023-09-13 (×2): qty 6
  Filled 2023-09-13 (×2): qty 12
  Filled 2023-09-13: qty 6
  Filled 2023-09-13: qty 12
  Filled 2023-09-13: qty 6
  Filled 2023-09-13: qty 3
  Filled 2023-09-13 (×2): qty 6
  Filled 2023-09-13 (×2): qty 9
  Filled 2023-09-13: qty 12
  Filled 2023-09-13: qty 6

## 2023-09-13 MED ORDER — WARFARIN PHARMACY TO DOSE PLACEHOLDER
ORAL | Status: DC
Start: 2023-09-13 — End: 2023-09-15

## 2023-09-13 MED ORDER — CALCIUM CARBONATE ANTACID 500 MG PO CHEW
500.0000 mg | CHEWABLE_TABLET | Freq: Four times a day (QID) | ORAL | Status: DC | PRN
Start: 2023-09-13 — End: 2023-09-20
  Administered 2023-09-13: 500 mg via ORAL
  Filled 2023-09-13: qty 1

## 2023-09-13 MED ORDER — DEXTROSE 50 % IV SOLN
12.5000 g | INTRAVENOUS | Status: DC | PRN
Start: 2023-09-13 — End: 2023-09-20

## 2023-09-13 MED ORDER — GLUCAGON 1 MG IJ SOLR (WRAP)
1.0000 mg | INTRAMUSCULAR | Status: DC | PRN
Start: 2023-09-13 — End: 2023-09-20

## 2023-09-13 MED ORDER — DEXTROSE 10 % IV BOLUS
12.5000 g | INTRAVENOUS | Status: DC | PRN
Start: 2023-09-13 — End: 2023-09-20

## 2023-09-13 MED ORDER — WARFARIN SODIUM 7.5 MG PO TABS
7.5000 mg | ORAL_TABLET | Freq: Every day | ORAL | Status: DC
Start: 2023-09-13 — End: 2023-09-14
  Administered 2023-09-13: 7.5 mg via ORAL
  Filled 2023-09-13 (×2): qty 1

## 2023-09-13 NOTE — PT Eval Note (Signed)
 IPhysical Therapy Evaluation  Carl [name]      Post Acute Care Therapy Recommendations:     Discharge Recommendations:  Home with supervision, Home with home health PT    DME needs IF patient is discharging home: Patient already has needed equipment    Therapy discharge recommendations may change with patient status.  Please refer to most recent note for up-to-date recommendations.    Assessment:   Significant Findings: Ambulated w/o O2. O2 sats 93-87%. Placed 2-3L O2 for recovery. Quickly recovered while seated EOB.     Carl [name] is a 83 y.o. male admitted 09/10/2023.  Patient in bed upon arrival, amenable to physical therapy evaluation. RN cleared patient for therapy. Patient agreeable to working with PT. Patient performs all mobility, ambulation with supervision. Demonstrates a step through gait pattern with decreased cadence and stride length. VC utilized for technique and safety. Encouraged patient to continue to mobilize and ambulate with RN staff. Patient supine in bed with all needs in reach. Spoke to RN about patient. Family in room to assist with needs.     Impairments: none    Therapy Diagnosis: impaired functional mobility    Rehabilitation Potential: good    Treatment Activities: Evaluation, gait training    Educated the patient to role of physical therapy, plan of care, goals of therapy and safety with mobility and ADLs.    Plan:   Treatment/Interventions: No skilled interventions needed at this time     PT Frequency: one time visit - therapy discontinued   Risks/Benefits/POC Discussed with Pt/Family: With patient;With patient/family        Precautions and Contraindications:   Weight Bearing Status: no restrictions  Other Precautions: Fall risk    Consult received for Carl [name] for PT Evaluation and Treatment.  Patient's medical condition is appropriate for Physical therapy intervention at this time.    Medical Diagnosis: Hypoxia [R09.02]  Pneumonia of left lower  lobe due to infectious organism [J18.9]  Sepsis, due to unspecified organism, unspecified whether acute organ dysfunction present [A41.9]      History of Present Illness:   Carl [name] is a 83 y.o. male admitted on 09/10/2023 with hypoxic respiratory failure secondary to left lung pneumonia and left lung effusion. Per chart.     Past Medical/Surgical History:  Medical History[1] Past Surgical History[2]    X-Rays/Tests/Labs:   XR Chest AP Portable    Result Date: 09/12/2023  1.Slight improvement of moderate to large left pleural effusion/atelectasis. Superimposed pneumonia is less likely, but cannot be excluded. 2.Stable retrocardiac atelectasis. Lillette CHRISTELLA Gillis, MD 09/12/2023 12:42 PM    CT chest without contrast    Result Date: 09/11/2023  1. New masslike opacity along the left heart border in the infrahilar region and new left cardiophrenic angle mass, suspicious for neoplasm. 2. Small bilateral pulmonary nodules, most compatible with metastasis. 3. Small left pleural effusion with compressive atelectasis in the left lower lobe. Consolidation in the lingula. 4. Left adrenal nodule, indeterminate. Levada Cinnamon, MD 09/11/2023 2:55 PM    CT Angio Chest (PE or trauma protocol)    Result Date: 09/11/2023  1.No acute pulmonary emboli. 2.Increased moderate to large left pleural effusion with associated compressive atelectasis of the left lower lobe and lingula. 3.Multiple enlarging pulmonary nodules and enlarging left anterior cardiophrenic mass/nodal mass, highly suspicious for metastases. Garnette Rouse, MD 09/11/2023 1:08 AM    XR Chest  AP Portable    Result Date: 09/10/2023  Moderate left pleural effusion, increased  compared to December 18. Hildegard Bayard, MD 09/10/2023 8:04 PM    X-ray chest PA and lateral    Result Date: 08/25/2023  1. Stable elevated left hemidiaphragm, left basilar atelectasis and/or patchy airspace opacity and small left pleural effusion. These findings would be better evaluated on unenhanced CT of the  chest as clinically indicated. Lamarr Bellini, MD 08/25/2023 6:43 AM     Social History:   Prior Level of Function:  Prior level of function: Independent with ADLs, Ambulates with assistive device  Assistive Device: Single point cane  Baseline Activity Level: Community ambulation  DME Currently at Home: Cane, Single Point, Other (Comment), BP Cuff, Glucometer, ADL- [employer]    Home Living Arrangements:  Living Arrangements: Alone  Type of Home: Apartment  Home Layout: One level, Able to live on main level with bedroom/bathroom, Performs ADL's on one level  DME Currently at Home: Cane, [employer], Other (Comment), BP Cuff, Glucometer, ADL- Grab Bars    Subjective: We lived in [place]   Patient is agreeable to participation in the therapy session. Nursing clears patient for therapy.     Patient goal: return to PLOF    Pain Assessment:  No report of pain    Objective:   Observation of Patient/Vital Signs:  Patient is in bed with peripheral IV, O2 at 3 liters/minute via nasal cannula in place.  Pt wore mask during therapy session:No      Observation of Patient/Vital signs:    Cognition/Neuro Status  Arousal/Alertness: Appropriate responses to stimuli  Attention Span: Appears intact  Orientation Level: Oriented X4  Memory: Appears intact  Following Commands: Follows all commands and directions without difficulty  Safety Awareness: independent  Insights: Fully aware of deficits  Problem Solving: Able to problem solve independently    Musculoskeletal Examination:  Gross ROM  Right Upper Extremity ROM: within functional limits  Left Upper Extremity ROM: within functional limits  Right Lower Extremity ROM: within functional limits  Left Lower Extremity ROM: within functional limits    Gross Strength  Right Upper Extremity Strength: within functional limits  Left Upper Extremity Strength: within functional limits  Right Lower Extremity Strength: within functional limits  Left Lower Extremity Strength: within  functional limits    Functional Mobility:  Supine to Sit: Supervision  Scooting to HOB: Supervision  Scooting to EOB: Supervision  Sit to Supine: Supervision  Sit to Stand: Supervision  Stand to Sit: Supervision    Ambulation:  PMP - Progressive Mobility Protocol   PMP Activity: Step 7 - Walks out of Room  Distance Walked (ft) (Step 6,7): 200 Feet      Balance:  Balance: within functional limits    Participation and Activity Tolerance:  Participation Effort: good  Endurance: Tolerates 10 - 20 min exercise with multiple rests    Patient left with call bell within reach, all needs met, and all questions answered    SCD's off (as found)  Floor mat not in place (as found)  Bed alarm off (as found)    Goals:   Goals  Goal Formulation: With patient, With patient/family  Time for Goal Acheivement: 1 visit  Goals: Select goal  Pt Will Go Supine To Sit: with supervision, to maximize functional mobility and independence, Goal met  Pt Will Perform Sit to Stand: with supervision, to maximize functional mobility and independence, Goal met  Pt Will Ambulate: 151-200 feet, with single point cane, with supervision, to maximize functional mobility and independence, Goal met    PPE  worn during session: gloves    Time of treatment:   PT Received On: 09/13/23  Start Time: 1100  Stop Time: 1125  Time Calculation (min): 25 min    Camellia Martinez PT, DPT [phone]         [1]   Past Medical History:  Diagnosis Date    Diabetes mellitus     Hyperlipidemia     Hypertension    [2]   Past Surgical History:  Procedure Laterality Date    CARDIAC SURGERY  2004    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    SKIN BIOPSY      TONSILLECTOMY

## 2023-09-13 NOTE — Progress Notes (Signed)
 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 Paroxysmal atrial fib-on coumadin  for 20 years  4. CAD-s/p CABG in 2005  5. OSA-not on CPAP currently  6. HTN/Hyperlipidemia/DM  7. Family history of HIT, son, not pt  8. Elevated lactic acid but not sure he really has an infection-short term antibiotics for now      Plan:   Continue antibiotics  Continue nebs  O2 as needed      Leeroy JAYSON Citizen, MD  09/13/23  5:21 PM      Interval History     Interval History/24 hr. Events:  on 1 L NC satting 95%. Looks better in general    Review of Systems:   Reviewed and no changes      Physical Exam:     VITAL SIGNS PHYSICAL EXAM   Vitals:    09/13/23 1602   BP: 114/67   Pulse: (!) 109   Resp: 19   Temp: 97.5 F (36.4 C)   SpO2: 95%     Temp (24hrs), Avg:97.5 F (36.4 C), Min:97.3 F (36.3 C), Max:98.1 F (36.7 C)                SpO2: 95 % (09/13/2023  4:02 PM)  O2 Device: Nasal cannula (09/13/2023  4:02 PM)  O2 Flow Rate (L/min): 1 L/min (09/13/2023  4:02 PM)     Physical Exam  General: awake, alert, breathing comfortably, no acute distress  Cardiovascular: RRR  Neck: No JVD  Lungs: Diminished over left mid and lower lung bases  Abdomen: soft, non-tender, non-distended; no palpable masses,  normoactive bowel sounds  Extremities: Trace edema BLE  Pulse: equal pulses,  Neurological: Alert and oriented X3, mood and affect normal         Meds:     Current Facility-Administered Medications   Medication Dose Route Frequency    albuterol -ipratropium  3 mL Nebulization Q6H WA    allopurinol    300 mg Oral Daily    amLODIPine   5 mg Oral Daily    atorvastatin   40 mg Oral Daily    cefepime   2 g Intravenous Q8H    fluticasone  furoate-vilanterol  1 puff Inhalation QAM    furosemide   20 mg Oral Daily    hydroCHLOROthiazide   25 mg Oral Daily    insulin  lispro  1-5 Units Subcutaneous TID AC    isosorbide  mononitrate  30 mg Oral Daily    lisinopril   40 mg Oral Daily    metoprolol  succinate  100 mg Oral Daily    warfarin  7.5 mg Oral Daily at 1800    warfarin   Oral See Admin Instructions       Labs:     Recent Labs   Lab 09/13/23  0449 09/12/23  0354 09/11/23  1405 09/11/23  0617 09/10/23  1946   WBC 14.36*  --  17.55* 16.11* 15.16*   RBC 4.31  --  4.56 4.33 5.12   Hemoglobin 13.2  --  13.7 13.2 15.7   Hematocrit 39.6  --  42.0 39.6 47.1   Glucose 218* 202*  --  188* 220*   BUN 21 21  --  28 29*   Creatinine 0.8 0.8  --  0.9 0.9   Calcium  9.1 9.4  --  9.5 10.5*   Sodium 136 141  --  141 140   Potassium 3.9 3.3*  --  4.1 3.9   Chloride 105 104  --  103 101   CO2 27 27  --  23 26           Imaging:         Outside imaging and imaging available in Epic were directly reviewed     CXR post tap  1.Slight improvement of moderate to large left pleural  effusion/atelectasis. Superimposed pneumonia is less likely, but cannot be  excluded.  2.Stable retrocardiac atelectasis.      Signed by: Leeroy JAYSON Citizen, MD    [facility]mon[facility]  Spectra  [phone]

## 2023-09-13 NOTE — Plan of Care (Signed)
 Problem: Moderate/High Fall Risk Score >5  Goal: Patient will remain free of falls  Outcome: Progressing  Flowsheets (Taken 09/13/2023 0800)  Moderate Risk (6-13):   MOD-include family in multidisciplinary POC discussions   MOD-Use gait belt when appropriate   MOD-Request PT/OT consult order for patients with gait/mobility impairment   MOD-Perform dangle, stand, walk (DSW) prior to mobilization   MOD-Utilize diversion activities   MOD-Place bedside commode and assistive devices out of sight when not in use   MOD-Remain with patient during toileting   MOD-Floor mat at bedside (where available) if appropriate   MOD-Consider activation of bed alarm if appropriate     Problem: Compromised Activity/Mobility  Goal: Activity/Mobility Interventions  Outcome: Progressing  Flowsheets (Taken 09/13/2023 0800)  Activity/Mobility Interventions: Pad bony prominences, TAP Seated positioning system when OOB, Promote PMP, Reposition q 2 hrs / turn clock, Offload heels     Problem: Inadequate Gas Exchange  Goal: Adequate oxygenation and improved ventilation  Outcome: Progressing  Flowsheets (Taken 09/13/2023 1135)  Adequate oxygenation and improved ventilation:   Assess lung sounds   Monitor SpO2 and treat as needed   Provide mechanical and oxygen support to facilitate gas exchange   Position for maximum ventilatory efficiency   Teach/reinforce use of incentive spirometer 10 times per hour while awake, cough and deep breath as needed   Plan activities to conserve energy: plan rest periods   Increase activity as tolerated/progressive mobility   Consult/collaborate with Respiratory Therapy

## 2023-09-13 NOTE — OT Eval Note (Signed)
 Occupational Therapy Eval Carl [name]        Post Acute Care Therapy Recommendations:     Discharge Recommendations:  Home with supervision, Home with home health OT    DME needs IF patient is discharging home: Four wheel walker    Therapy discharge recommendations may change with patient status.  Please refer to most recent note for up-to-date recommendations.    Assessment:   Significant Findings: None    Carl [name] is a 83 y.o. male admitted 09/10/2023.  Patient presents with dyspnea - found to have hypoxic respiratory failure secondary to left lung PNA and effusion. Pt s/p thoracentesis. Pt educated on ECTs, pacing and PLB. Sp02: 85% room air, improved >90% with rest breaks; 3L donned as found. Pt able to perform ADLs with supervision. No further acute OT needs. D/C acute OT services.      Therapy Diagnosis: ADL assessment    Rehabilitation Potential: good    Treatment Activities: Evaluation     Educated the patient to role of occupational therapy, plan of care, goals of therapy and safety with mobility and ADLs, energy conservation techniques, pursed lip breathing.    Plan:   D/C acute OT services     Treatment/Interventions: No skilled interventions needed at this time    Risks/benefits/POC discussed with patient     Unit: [facility] ORIG BLDG 5 EAST  Bed: FO569/FO569.01       Precautions and Contraindications:   Falls     Consult received for Carl [name] for OT Evaluation and Treatment.  Patient's medical condition is appropriate for Occupational Therapy intervention at this time.      History of Present Illness:    Carl [name] is a 83 y.o. male admitted on 09/10/2023 with dyspnea - found to have hypoxic respiratory failure secondary to left lung PNA and effusion. Pt s/p thoracentesis. Per chart    Admitting Diagnosis: Hypoxia [R09.02]  Pneumonia of left lower lobe due to infectious organism [J18.9]  Sepsis, due to unspecified organism, unspecified  whether acute organ dysfunction present [A41.9]    Past Medical/Surgical History:  Medical History[1]    Past Surgical History[2]        Imaging/Tests/Labs:  XR Chest AP Portable    Result Date: 09/12/2023  1.Slight improvement of moderate to large left pleural effusion/atelectasis. Superimposed pneumonia is less likely, but cannot be excluded. 2.Stable retrocardiac atelectasis. Carl CHRISTELLA Gillis, MD 09/12/2023 12:42 PM    CT chest without contrast    Result Date: 09/11/2023  1. New masslike opacity along the left heart border in the infrahilar region and new left cardiophrenic angle mass, suspicious for neoplasm. 2. Small bilateral pulmonary nodules, most compatible with metastasis. 3. Small left pleural effusion with compressive atelectasis in the left lower lobe. Consolidation in the lingula. 4. Left adrenal nodule, indeterminate. Carl Cinnamon, MD 09/11/2023 2:55 PM    CT Angio Chest (PE or trauma protocol)    Result Date: 09/11/2023  1.No acute pulmonary emboli. 2.Increased moderate to large left pleural effusion with associated compressive atelectasis of the left lower lobe and lingula. 3.Multiple enlarging pulmonary nodules and enlarging left anterior cardiophrenic mass/nodal mass, highly suspicious for metastases. Carl Rouse, MD 09/11/2023 1:08 AM    XR Chest  AP Portable    Result Date: 09/10/2023  Moderate left pleural effusion, increased compared to December 18. Carl Bayard, MD 09/10/2023 8:04 PM    X-ray chest PA and lateral    Result Date: 08/25/2023  1.  Stable elevated left hemidiaphragm, left basilar atelectasis and/or patchy airspace opacity and small left pleural effusion. These findings would be better evaluated on unenhanced CT of the chest as clinically indicated. Carl Bellini, MD 08/25/2023 6:43 AM      Social History:   Prior Level of Function: Mod I ADLs and mobility  Assistive Devices: SPC  Baseline Activity: Community level  DME Currently at Home: SPC, grab bars, shower chair  Home Living Arrangements:  Alone, Assisted Living Apt    Subjective: I am feeling better.    Patient is agreeable to participation in the therapy session. Nursing clears patient for therapy.     Patient Goal: To go home    Pain: No/Denies    Objective:   Patient is in bed with peripheral IV, O2 at 3 liters/minute via nasal cannula in place.    Pt wore mask during therapy session:No      Cognitive Status and Neuro Exam:  Alert  Follows directions     Musculoskeletal Examination  Gross ROM: WFL  Gross Strength: WFL    Activities of Daily Living  Eating: Ind hand to mouth   Grooming: setup   Bathing: NT, reviewed ECTs and pacing   UE Dressing: Ind  LE Dressing: supervision  Toileting: supervision simulated     Functional Mobility:  Supine to Sit: supervision  Sit to Stand: supervision  Transfers: supervision with [facility]     PMP - Progressive Mobility Protocol   PMP Activity: Step 6 - Walks in Room    Balance  Static Sitting: good  Dynamic Sitting: fair +  Static Standing: fair +  Dynamic Standing: fair +    Participation and Activity Tolerance  Participation Effort: good  Endurance: fair +, reviewed ECTs, pacing and PLB    Patient left with call bell within reach, all needs met, SCDs off as found, fall mat as found, bed alarm as found,  and all questions answered. RN notified of session outcome and patient response.     Goals: EVAL only               PPE worn during session: gloves    Tech present: No    Carl [name] OTR/L  Pager # [phone]       Time of Treatment:   OT Received On: 09/13/23  Start Time: 1035  Stop Time: 1100  Time Calculation (min): 25 min              [1]   Past Medical History:  Diagnosis Date    Diabetes mellitus     Hyperlipidemia     Hypertension    [2]   Past Surgical History:  Procedure Laterality Date    CARDIAC SURGERY  2004    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    SKIN BIOPSY      TONSILLECTOMY

## 2023-09-13 NOTE — Nursing Progress Note (Addendum)
 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      Last 24 hrs ( any critical events): AOX4, denies pain, received on 3L O2 via NC gradually reduced to 1L sats 94% , dyspneic on exertion. Per pulmonology: Continue antibiotics. Continue nebs. O2 as needed. INR 1.3- warfarin commenced as ordered.         Abnormal labs/VS(needing any orders): WBC 14.36  Respiratory (changes in oxygenation): 1L O2 via NC     GI/GU: continent x2, LBM 09/11/23     Neuro changes: Alert and oriented x4     Nutrition: Regular Diet     Activity/ Safety/mobility questions (need for PT/OT,sitter): Independent. Standby assist OOB     LDA access issues, line removal: PIV Left hand     Weight trend if applicable:      Tele/Rhythm- if changes: not on tele     Skin/Wounds-if applicable: Scattered bruises     VTE Prophylaxis:      Upcoming procedures: Pending      Discharge date/plans: TBD

## 2023-09-13 NOTE — Progress Notes (Signed)
 MEDICINE PROGRESS NOTE    Date Time: 09/13/23 12:33 PM  Patient Name: Carl [name]  Attending Physician: Boneta Kingfisher, MD    Assessment / Plan:   83 year old male with past medical history significant for COPD, chronic emphysema, CAD, paroxysmal A-fib on warfarin, OSA, GERD, hypertension, hyperlipidemia, diabetes, presented to hospital on 1/5 with dyspnea, found to have hypoxic respiratory failure secondary to left lung pneumonia and left lung effusion    #Acute hypoxic respiratory failure, currently on 3 L of oxygen via nasal cannula, secondary to pneumonia and effusion, will try to wean.  Will need ambulatory pulse ox meter before discharge    #Left lower lobe pneumonia, patient remains on cefepime  and azithromycin     #COPD, not in exacerbation, remains on Breo Ellipta , DuoNebs every 6 hours, [place] pulmonology is following as well.  Received dupixent  300 mg on 1/7     #Left pleural effusion, underwent thoracentesis by [facility] pulmonology on 1/7, 1.3 L was drained, repeat chest x-ray showed improved but persistent effusion    #Paroxysmal A-fib, on warfarin as outpatient, he was started on heparin  drip due to planned thoracentesis. Will resume warfarin this evening, managed by pharmacy. On metoprolol  for rate control    #CAD on metoprolol , atorvastatin     #HFpEF, on metoprolol  succinate, furosemide  20 mg daily, lisinopril  40 mg daily    #Essential hypertension on amlodipine  5 mg daily metoprolol  succinate 100 mg daily, lisinopril  40 mg p.o. daily, Imdur  30 mg daily    #Diabetes on sliding scale coverage    #Hypokalemia, potassium is better after repletion    #Hypomagnesemia, magnesium  1.7, received repletion    For DVT prophylaxis, currently not on anticoagulation  He is full code  Remains hospitalized for a day or 2  Son was updated this morning    Kingfisher Boneta, MD        Additional Diagnoses:                 Safety Checklist:     DVT prophylaxis:  CHEST guideline (See page [id number]) Chemical    Foley:  [place] R[facility] Foley protocol Not present   IVs:  Peripheral IV   PT/OT: Ordered   Daily CBC & or Chem ordered:  SHM/ABIM guidelines (see #5) No   Reference for approximate charges of common labs: CBC auto diff - $76  BMP - $99  Mg - $79    Lines:     Patient Lines/Drains/Airways Status       Active PICC Line / CVC Line / PIV Line / Drain / Airway / Intraosseous Line / Epidural Line / ART Line / Line / Wound / Pressure Ulcer / NG/OG Tube       Name Placement date Placement time Site Days    Peripheral IV 09/10/23 22 G Diffusion Left Antecubital 09/10/23  1954  Antecubital  1                     Disposition: (Please see PAF column for Expected D/C Date)   Today's date: 09/13/2023  Admit Date: 09/10/2023  7:27 PM  LOS: 3  Clinical Milestones: copd, effusion   Anticipated discharge needs: tbd      Subjective     CC: Hypoxia    HPI/Subjective: 83 year old male with past medical history significant for COPD, chronic emphysema, CAD, paroxysmal A-fib on warfarin, OSA, GERD, hypertension, hyperlipidemia, diabetes, presented to hospital on 1/5 with dyspnea  At presentation blood pressure was 130/74 heart rate 116 temperature 98.7  saturating 87% on room air  Blood work showed WBC of 15, hemoglobin of 15.7 platelet count 367  BMP was grossly unremarkable except blood glucose of 220, lactic acid of 3.2, BNP 1000 INR 2.9  Chest x-ray showed moderate left pleural effusion  Admitted to hospitalist service with acute hypoxic respiratory failure secondary to left-sided pneumonia and effusion, started on cefepime  and azithromycin   [facility] pulmonology was consulted for possible thoracentesis  His warfarin was held, he was started on enoxaparin   Pulmonology recommended thoracentesis when INR is less than 1.5  Underwent thoracentesis on 1/7, 1.3 L was removed  Repeat chest x-ray showed improved but persistent effusion    On 1/8 patient was seen at 9:25 AM with nurse Elyn  No acute events noted overnight  This morning patient seems to  be stable, denies chest pain, dyspnea, abdominal pain      Review of Systems:     As per HPI    Physical Exam:     VITAL SIGNS PHYSICAL EXAM   Temp:  [97.3 F (36.3 C)-98.1 F (36.7 C)] 97.5 F (36.4 C)  Heart Rate:  [94-104] 104  Resp Rate:  [18-20] 19  BP: (105-137)/(66-87) 105/66  Blood Glucose:    Telemetry:     No intake or output data in the 24 hours ending 09/13/23 1233 Physical Exam  General: Well developed, well nourished, obese elderly male, in no apparent distress  Head and Neck: Atraumatic, normocephalic, PERRL, extraocular movements intact, no scleral icterus, wearing nasal cannula  Cardiovascular: Irregular rhythm, normal rate, no murmurs  Lungs: Decreased breath sounds at the bases bilaterally, no wheezing  Abdomen: Soft, non-tender, non-distended  Extremities: No lower extremity edema  Neuro: Awake, alert, oriented x3         Meds:     Medications were reviewed:  Current Facility-Administered Medications   Medication Dose Route Frequency    albuterol -ipratropium  3 mL Nebulization Q6H WA    allopurinol   300 mg Oral Daily    amLODIPine   5 mg Oral Daily    atorvastatin   40 mg Oral Daily    cefepime   2 g Intravenous Q8H    fluticasone  furoate-vilanterol  1 puff Inhalation QAM    furosemide   20 mg Oral Daily    hydroCHLOROthiazide   25 mg Oral Daily    insulin  lispro  1-5 Units Subcutaneous TID AC    isosorbide  mononitrate  30 mg Oral Daily    lisinopril   40 mg Oral Daily    metoprolol  succinate  100 mg Oral Daily     Infusion Meds[1]  PRN Medications[2]      Labs:     Labs (last 72 hours):    Recent Labs   Lab 09/13/23  0449 09/11/23  1405   WBC 14.36* 17.55*   Hemoglobin 13.2 13.7   Hematocrit 39.6 42.0   Platelet Count 288 340       Recent Labs   Lab 09/13/23  0449 09/12/23  0354   PT 14.1* 16.7*   INR 1.3 1.5    Recent Labs   Lab 09/13/23  0449 09/12/23  0354 09/11/23  0617 09/10/23  1946   Sodium 136 141 141 140   Potassium 3.9 3.3* 4.1 3.9   Chloride 105 104 103 101   CO2 27 27 23 26    BUN 21 21  28  29*   Creatinine 0.8 0.8 0.9 0.9   Calcium  9.1 9.4 9.5 10.5*   Albumin   --   --  2.4*  3.0*   Protein, Total  --   --  6.5 8.2   Bilirubin, Total  --   --  0.6 0.8   Alkaline Phosphatase  --   --  141* 192*   ALT  --   --  17 25   AST (SGOT)  --   --  43* 36   Glucose 218* 202* 188* 220*                   Microbiology, reviewed and are significant for:  Microbiology Results (last 15 days)       Procedure Component Value Units Date/Time    Culture and Gram Stain, Aerobic and Anaerobic Bacteria, Wound/Tissue/Fluid (Order) [[id number]] Collected: 09/12/23 1131    Order Status: Sent Specimen: Pleural Fluid from Pleural Cavity, Left Updated: 09/12/23 1147    Narrative:      The following orders were created for panel order Culture and Gram Stain, Aerobic and Anaerobic Bacteria, Wound/Tissue/Fluid (Order).  Procedure                               Abnormality         Status                     ---------                               -----------         ------                     Culture And Gram Stain,.SABRASABRA[[id number]]                      Preliminary result           Please view results for these tests on the individual orders.    Culture And Gram Stain, Aerobic Bacteria, Wound/Tissue/Fluid [[id number]] Collected: 09/12/23 1131    Order Status: Completed Specimen: Pleural Fluid from Pleural Cavity, Left Updated: 09/12/23 1709     Gram Stain Many WBCs      No organisms seen    Narrative:      Stain performed on cytospin (concentrated) specimen.     Urine Streptococcus pneumoniae Antigen [[id number]]  (Normal) Collected: 09/11/23 0602    Order Status: Completed Specimen: Urine, Clean Catch Updated: 09/11/23 2055     Urine Streptococcus pneumoniae Antigen Negative     Comment: This is a presumptive test for the direct qualitative detection of bacterial antigen. This test is not intended as a substitute for a gram stain and bacterial culture. Samples with extremely low levels of antigen may yield negative results.       Urine  Legionella pneumophila serogroup 1 Antigen [[id number]]  (Normal) Collected: 09/11/23 0602    Order Status: Completed Specimen: Urine, Clean Catch Updated: 09/11/23 2053     Urine Legionella pneumophila serogroup 1 Antigen Negative     Comment: This test will not detect infections caused by other Legionella pneumophila serogroups or by other Legionella species. A negative antigen result does not exclude infection with Legionella pneumophila serogroup 1.       Nares, MRSA (methicillin-resistant Staphylococcus aureus) Screening, PCR [[id number]]  (Normal) Collected: 09/11/23 0143    Order Status: Completed Specimen: Swab from Nares Updated: 09/11/23 0426     MRSA (methicillin resistant Staphylococcus aureus) DNA Not  Detected     Comment: Testing performed with the Xpert MRSA NxG assay. This test is intended for the detection of methicillin-resistant Staphylococcus aureus (MRSA) DNA. This test is not intended to diagnose, guide, or monitor treatment for MRSA infections, or provide results of susceptibility to methicillin. A positive test result does not necessarily indicate the presence of viable organisms. A negative result does not preclude MRSA nasal colonization.       Culture, Sputum and Lower Respiratory [[id number]]     Order Status: Sent Specimen: Sputum, Expectorated     COVID-19 and Influenza ([place]) (symptomatic) [[id number]]  (Normal) Collected: 09/10/23 1946    Order Status: Completed Specimen: Swab from Anterior Nares Updated: 09/10/23 2019     SARS-CoV-2 (COVID-19) RNA Not Detected     Influenza A RNA Not Detected     Influenza B RNA Not Detected    Narrative:      A result of Detected indicates POSITIVE for the presence of viral RNA  A result of Not Detected indicates NEGATIVE for the presence of viral RNA    Test performed using the Roche cobas [place] SARS-CoV-2 & Influenza A/B assay. This is a multiplex real-time RT-PCR assay for the detection of SARS-CoV-2, influenza A, and influenza B virus RNA.  Viral nucleic acids may persist in vivo, independent of viability. Detection of viral nucleic acid does not imply the presence of infectious virus, or that virus nucleic acid is the cause of clinical symptoms. Negative results do not preclude SARS-CoV-2, influenza A, and/or influenza B infection and should not be used as the sole basis for diagnosis, treatment or other patient management decisions. Invalid results may be due to inhibiting substances in the specimen and recollection should occur.     Culture, Blood, Aerobic And Anaerobic [[id number]] Collected: 09/10/23 1946    Order Status: Completed Specimen: Blood, Venous Updated: 09/12/23 2300     Culture Blood No growth at 2 days            Imaging, reviewed and are significant for:  CTA chest      Signed by: Darletta Bunting, MD        This note was generated within the EP[employer] EMR using Dragon medical speech recognition software and may contain inherent errors or omissions not intended by the user. Grammatical and punctuation errors, random word insertions, deletions, pronoun errors and incomplete sentences are occasional consequences of this technology due to software limitations. Not all errors are caught or corrected. Although every attempt is made to root out erroneus and incomplete transcription, the note may still not fully represent the intent or opinion of the author. If there are questions or concerns about the content of this note or information contained within the body of this dictation they should be addressed directly with the author for clarification. '       [1]   Current Facility-Administered Medications   Medication Dose Route Frequency Last Rate   [2]   Current Facility-Administered Medications   Medication Dose Route    acetaminophen   650 mg Oral    benzocaine -menthol   1 lozenge Buccal    benzonatate   100 mg Oral    carboxymethylcellulose sodium  1 drop Both Eyes    dextrose   15 g of glucose Oral    Or    dextrose   12.5 g Intravenous    Or     dextrose   12.5 g Intravenous    Or    glucagon  (rDNA)  1 mg Intramuscular  dextrose   15 g of glucose Oral    Or    dextrose   12.5 g Intravenous    Or    dextrose   12.5 g Intravenous    Or    glucagon  (rDNA)  1 mg Intramuscular    magnesium  sulfate  1 g Intravenous    melatonin  3 mg Oral    naloxone   0.2 mg Intravenous    potassium & sodium phosphates   2 packet Oral    potassium chloride   0-60 mEq Oral    Or    potassium chloride   0-60 mEq Oral    Or    potassium chloride   10 mEq Intravenous    saline  2 spray Each Nare

## 2023-09-13 NOTE — Progress Notes (Signed)
 Surgical Progress Note    GI: Gas  []   BM []   NG []  Nausea []   Diet: Regular diet   GU: Foley []  Voiding [x]    Pain: denies pain   Central Line/Drain/Vac/Ostomy: n/a  Skin/Incision: scattered brusing , +1 pitting edema on LE   Mobility: Independent []  Set up assist []  Mod assist [x]  Max assist []    Braden Scale Score: 18 (09/12/23 2020).   Tele: []   Rhythm:  Cont Pulse ox: []   Shift Events : no significant events per shift. Pt son asked to address pt Blood glucose  ( pt is taking metformin  at home) and WBC trends   Vitals:    09/12/23 2307   BP: 122/77   Pulse: 98   Resp: 18   Temp: 98.1 F (36.7 C)   SpO2: 98%

## 2023-09-13 NOTE — Progress Notes (Signed)
 CASE MANAGEMENT PROGRESS NOTE:    Patient: Carl [name], Carl [name] (83 y.o., male)  Admission Date: 09/10/2023  Hospital Day: 3  Admission Diagnosis:Hypoxia [R09.02]  Pneumonia of left lower lobe due to infectious organism [J18.9]  Sepsis, due to unspecified organism, unspecified whether acute organ dysfunction present [A41.9]      Summary of Discharge Plan:  Home with [facility] SN/PT/OT family suppport    Identified Possible Discharge Barriers:  Wean 02 or ambulatory sat testing for home o2  IV antbx  PNA  PACC for HH set up    CM Interventions and Outcome:  CM/RN/MD bedside rounds completed. Barrier noted above, trending labs, CXR, IV antbx, thoracentesis only WBCs, home o2? Vs weaning oxygen.       Discussed above Discharge Plan with (patient, family, Care Team, others):  Care team, patient, son    CM will continue to identify and address  Tem[place] planning needs and any potential barriers.      Abigail Monks, BSN, RN  RN Case Manager 1  Case Management  [employer]  [phone]  [email or web address]

## 2023-09-13 NOTE — Progress Notes (Signed)
 WARFARIN MANAGEMENT    Carl [name] is a 83 y.o. male presenting with hypoxic respiratory failure secondary to left lung pneumonia and left lung effusion. Pt is on warfarin as an outpatient for paroxysmal Afib.     Indication: Atrial fibrillation   INR goal: 2 - 3    This is a continuation of home warfarin.  Warfarin counseling is deferred at this time as it is a home medication. Pharmacy will counsel if requested.  Warfarin dose PTA: 7.5mg  PO every evening    Outpatient warfarin management team: [facility]      Recent Labs     09/13/23  0449 09/12/23  0354 09/11/23  1405 09/11/23  0617 09/10/23  1946   Hemoglobin 13.2  --  13.7 13.2 15.7   Hematocrit 39.6  --  42.0 39.6 47.1   Platelet Count 288  --  340 325 367*   INR 1.3 1.5 3.0 2.8 2.9     Recent Labs     09/13/23  0449 09/12/23  0354 09/11/23  0617 09/10/23  1946   Creatinine 0.8 0.8 0.9 0.9   BUN 21 21 28  29*   Albumin   --   --  2.4* 3.0*   AST (SGOT)  --   --  43* 36   ALT  --   --  17 25   Bilirubin, Total  --   --  0.6 0.8     Estimated Creatinine Clearance: 69.1 mL/min (based on SCr of 0.8 mg/dL).    ASSESSMENT   INR is subtherapeutic at 1.3. The INR has decreased 0.2 from yesterday's morning draw.  The patient is not indicated for bridging.  Potential drug-drug interactions include: allopurinol  (increased effect of warfarin)  Diet: adult regular     PLAN   Recommend to resume home dose of warfarin 7.5 mg. Last dose of 7.5mg  was on 1/4.   The next INR level is ordered to be collected with AM labs.    Adult Inpatient Warfarin Dosing Guideline

## 2023-09-14 ENCOUNTER — Inpatient Hospital Stay: Payer: Medicare Other

## 2023-09-14 ENCOUNTER — Ambulatory Visit (INDEPENDENT_AMBULATORY_CARE_PROVIDER_SITE_OTHER): Payer: Medicare Other | Admitting: Internal Medicine

## 2023-09-14 LAB — WHOLE BLOOD GLUCOSE POCT
Whole Blood Glucose POCT: 204 mg/dL — ABNORMAL HIGH (ref 70–100)
Whole Blood Glucose POCT: 313 mg/dL — ABNORMAL HIGH (ref 70–100)
Whole Blood Glucose POCT: 243 mg/dL — ABNORMAL HIGH (ref 70–100)

## 2023-09-14 LAB — PT/INR
INR: 1.3 (ref 0.9–1.1)
PT: 14.9 s — ABNORMAL HIGH (ref 10.1–12.9)

## 2023-09-14 MED ORDER — METFORMIN HCL 500 MG PO TABS
1000.0000 mg | ORAL_TABLET | Freq: Two times a day (BID) | ORAL | Status: DC
Start: 2023-09-14 — End: 2023-09-15
  Administered 2023-09-14 – 2023-09-15 (×2): 1000 mg via ORAL
  Filled 2023-09-14 (×2): qty 2

## 2023-09-14 MED ORDER — WARFARIN SODIUM 2 MG PO TABS
5.0000 mg | ORAL_TABLET | Freq: Once | ORAL | Status: AC
Start: 2023-09-14 — End: 2023-09-14
  Administered 2023-09-14: 5 mg via ORAL
  Filled 2023-09-14: qty 3

## 2023-09-14 NOTE — Nursing Progress Note (Signed)
 Brief background: 83 year old male with past medical history significant for COPD, chronic emphysema, CAD, paroxysmal A-fib on warfarin, OSA, GERD, hypertension, hyperlipidemia, diabetes, presented to hospital on 1/5 with dyspnea, found to have hypoxic respiratory failure secondary to left lung pneumonia and left lung effusion     Last 24 Hrs (any critical events): no acute events overnight  O2 weaned from 1L to 0.5L, patient sating above 92%.  PRN tums ordered for indigestion   Safety measures in place, purposeful rounding maintained   Son at bedside overnight    LDA: left hand PIV  Abnormal labs/VS (needing any orders): vitals stable     Neuro changes: A/O x4  Respiratory: 0.5L NC  GI/GU: voids freely, LBM 1/8  Nutrition: Regular diet    Activity/ Safety/ Mobility  Questions (need for PT/OT, sitter): independent     Tele/ Rhythm: no tele  Skin/ Wounds: scattered bruising, BLE +1 edema     Case management needs: ambulatory pulse ox test   Discharge plan: Home w [facility]   Discharge barriers: Wean O2, IV abx, PACC for HH set up     Problem: Moderate/High Fall Risk Score >5  Goal: Patient will remain free of falls  09/14/2023 0234 by Casandra Bossier, RN  Outcome: Progressing  Flowsheets  Taken 09/14/2023 0234  Moderate Risk (6-13):   MOD-Floor mat at bedside (where available) if appropriate   MOD-Perform dangle, stand, walk (DSW) prior to mobilization   MOD-Request PT/OT consult order for patients with gait/mobility impairment  Taken 09/13/2023 2045  High (Greater than 13): LOW-Fall Interventions Appropriate for Low Fall Risk  09/13/2023 2047 by Casandra Bossier, RN  Outcome: Progressing  Flowsheets (Taken 09/13/2023 2045)  High (Greater than 13): LOW-Fall Interventions Appropriate for Low Fall Risk     Problem: Compromised Activity/Mobility  Goal: Activity/Mobility Interventions  Outcome: Progressing  Flowsheets (Taken 09/14/2023 0234)  Activity/Mobility Interventions: Pad bony prominences, TAP Seated positioning system when OOB,  Promote PMP, Reposition q 2 hrs / turn clock, Offload heels     Problem: Inadequate Gas Exchange  Goal: Adequate oxygenation and improved ventilation  09/14/2023 0234 by Casandra Bossier, RN  Outcome: Progressing  Flowsheets (Taken 09/13/2023 2045)  Adequate oxygenation and improved ventilation:   Assess lung sounds   Monitor SpO2 and treat as needed   Provide mechanical and oxygen support to facilitate gas exchange   Teach/reinforce use of incentive spirometer 10 times per hour while awake, cough and deep breath as needed   Plan activities to conserve energy: plan rest periods   Increase activity as tolerated/progressive mobility  09/13/2023 2047 by Casandra Bossier, RN  Outcome: Progressing  Flowsheets (Taken 09/13/2023 2045)  Adequate oxygenation and improved ventilation:   Assess lung sounds   Monitor SpO2 and treat as needed   Provide mechanical and oxygen support to facilitate gas exchange   Teach/reinforce use of incentive spirometer 10 times per hour while awake, cough and deep breath as needed   Plan activities to conserve energy: plan rest periods   Increase activity as tolerated/progressive mobility

## 2023-09-14 NOTE — Plan of Care (Signed)
 Problem: Moderate/High Fall Risk Score >5  Goal: Patient will remain free of falls  Outcome: Progressing  Flowsheets (Taken 09/14/2023 0800)  Moderate Risk (6-13):   MOD-include family in multidisciplinary POC discussions   MOD-Perform dangle, stand, walk (DSW) prior to mobilization   MOD-Utilize diversion activities   MOD-Remain with patient during toileting   MOD-Floor mat at bedside (where available) if appropriate   MOD-Apply bed exit alarm if patient is confused     Problem: Compromised Activity/Mobility  Goal: Activity/Mobility Interventions  Outcome: Progressing  Flowsheets (Taken 09/14/2023 0800)  Activity/Mobility Interventions: Pad bony prominences, TAP Seated positioning system when OOB, Promote PMP, Reposition q 2 hrs / turn clock, Offload heels     Problem: Inadequate Gas Exchange  Goal: Adequate oxygenation and improved ventilation  Outcome: Progressing  Flowsheets (Taken 09/14/2023 1048)  Adequate oxygenation and improved ventilation:   Assess lung sounds   Monitor SpO2 and treat as needed   Provide mechanical and oxygen support to facilitate gas exchange   Position for maximum ventilatory efficiency   Teach/reinforce use of incentive spirometer 10 times per hour while awake, cough and deep breath as needed   Plan activities to conserve energy: plan rest periods   Increase activity as tolerated/progressive mobility   Consult/collaborate with Respiratory Therapy

## 2023-09-14 NOTE — Nursing Progress Note (Signed)
 Brief background: 83 year old male with past medical history significant for COPD, chronic emphysema, CAD, paroxysmal A-fib on warfarin, OSA, GERD, hypertension, hyperlipidemia, diabetes, presented to hospital on 1/5 with dyspnea, found to have hypoxic respiratory failure secondary to left lung pneumonia and left lung effusion      Last 24 Hrs (any critical events): no acute events during the day                                                         Checked pulse oxy while ambulating saturating 91-92 % on room air. No SOB  Repeat CXR done      LDA: left hand PIV    Abnormal labs/VS (needing any orders): vitals stable      Neuro changes: A/O x4    Respiratory: weaned from oxygen 0.5L @11am , on Room Air sats 92-94%                           GI/GU: voids freely, LBM 1/8    Nutrition: Regular, Consistent Carb     Activity/ Safety/ Mobility    Questions (need for PT/OT, sitter): independent      Tele/ Rhythm: no tele    Skin/ Wounds: scattered bruising, BLE +1 edema      Case management needs:   Discharge plan: Home w [facility]   Discharge barriers: Wean O2, IV abx, PACC for HH set up      Problem: Moderate/High Fall Risk Score >5  Goal: Patient will remain free of falls

## 2023-09-14 NOTE — Progress Notes (Signed)
 WARFARIN MANAGEMENT    Carl [name] is a 83 y.o. male presenting with acute hypoxic respiratory failure 2/2 L lung PNA and L lung effusion. PMH of COPD, chronic emphysema, CAD s/p CABG 2005, paroxysmal Afib (on warfarin PTA), HTN, HLD, DM, OSA,GERD.   Pharmacy has been consulted to assist with warfarin dosing.     Indication: stroke prevention secondary to atrial fibrillation   INR goal: 2 - 3    This is a continuation of home warfarin.  Warfarin counseling is deferred at this time as it is a home medication. Pharmacy will counsel if requested.  Warfarin dose PTA: 2.5 mg on Fridays   5 mg daily on all other days   Per most recent [facility] [address] clinic visit on 09/07/23, pt reports to have been taking warfarin differently than instructed. Instead, has been taking 2.5 mg on Tues/Thurs/Sun and 5 mg on all other days   Outpatient warfarin management team: [facility]      Recent Labs     09/13/23  0449 09/12/23  0354 09/11/23  1405   Hemoglobin 13.2  --  13.7   Hematocrit 39.6  --  42.0   Platelet Count 288  --  340   INR 1.3 1.5 3.0     Recent Labs     09/13/23  0449 09/12/23  0354   Creatinine 0.8 0.8   BUN 21 21     Estimated Creatinine Clearance: 69.1 mL/min (based on SCr of 0.8 mg/dL).    ASSESSMENT   INR is subtherapeutic at 1.3. The INR has not changed  from yesterday's morning draw.  The patient is not indicated for bridging.  Potential drug-drug interactions include: allopurinol  (may enhance the anticoagulant effect of warfarin)   Diet: Regular diet; therapeutic/modified (consistent carbohydrate)    PLAN   Recommend to continue PTA maintenance dose. Will order warfarin 5 mg x 1 today, and will continue to monitor INR tomorrow.   Subtherapeutic INR may be transient - as patient received IV vitamin K 5 mg x 1 dose (on 1/6 at 1229) in anticipation for a procedure, and also missed a dose on 1/7.   The next INR level is ordered to be collected with AM labs.    Adult Inpatient Warfarin Dosing Guideline

## 2023-09-14 NOTE — Progress Notes (Signed)
 MEDICINE PROGRESS NOTE    Date Time: 09/14/23 12:18 PM  Patient Name: Carl [name]  Attending Physician: Boneta Kingfisher, MD    Assessment / Plan:   83 year old male with past medical history significant for COPD, chronic emphysema, CAD, paroxysmal A-fib on warfarin, OSA, GERD, hypertension, hyperlipidemia, diabetes, presented to hospital on 1/5 with dyspnea, found to have hypoxic respiratory failure secondary to left lung pneumonia and left lung effusion    #Acute hypoxic respiratory failure, currently on 0.5 L of oxygen via nasal cannula, secondary to pneumonia and effusion, will try to wean.  Will need ambulatory pulse ox meter before discharge    #Left lower lobe pneumonia, patient remains on cefepime  and azithromycin .  Preliminary report of pleural fluid culture is not demonstrating any organisms.  His blood cultures has been negative to date    #COPD, not in exacerbation, remains on Breo Ellipta , DuoNebs every 6 hours, [place] pul[facility]ology is following as well.  Received dupixent  300 mg on 1/7     #Left pleural effusion, underwent thoracentesis by [facility] pulmonology on 1/7, 1.3 L was drained, repeat chest x-ray showed improved but persistent effusion.  Repeating chest x-ray on 1/9, slight improvement but still has effusion.  Patient is on furosemide  20 mg p.o. daily    #Paroxysmal A-fib, on warfarin as outpatient, he was started on heparin  drip due to planned thoracentesis.  Warfarin resumed on 1/8, managed by pharmacy.  On metoprolol  succinate for rate control but he is in RVR this morning    #Diabetes, A1c 7.4, on glipizide  and metformin  at home, here he became more hyperglycemic in the last 2 days, currently on sliding scale coverage, will resume metformin  today    #CAD on metoprolol , atorvastatin     #HFpEF, on metoprolol  succinate, furosemide  20 mg daily, lisinopril  40 mg daily    #Essential hypertension on amlodipine  5 mg daily metoprolol  succinate 100 mg daily, lisinopril  40 mg p.o. daily, Imdur   30 mg daily    #Hypokalemia, potassium is better after repletion    #Hypomagnesemia, magnesium  1.7, received repletion    For DVT prophylaxis, on warfarin  He is full code  Remains hospitalized for today, possible discharge tomorrow  Son was updated this morning    Kingfisher Boneta, MD        Additional Diagnoses:                 Safety Checklist:     DVT prophylaxis:  CHEST guideline (See page [id number]) Chemical   Foley:  [place] [facility] Foley protocol Not present   IVs:  Peripheral IV   PT/OT: Ordered   Daily CBC & or Chem ordered:  SHM/ABIM guidelines (see #5) No   Reference for approximate charges of common labs: CBC auto diff - $76  BMP - $99  Mg - $79    Lines:     Patient Lines/Drains/Airways Status       Active PICC Line / CVC Line / PIV Line / Drain / Airway / Intraosseous Line / Epidural Line / ART Line / Line / Wound / Pressure Ulcer / NG/OG Tube       Name Placement date Placement time Site Days    Peripheral IV 09/10/23 22 G Diffusion Left Antecubital 09/10/23  1954  Antecubital  1                     Disposition: (Please see PAF column for Expected D/C Date)   Today's date: 09/14/2023  Admit Date: 09/10/2023  7:27 PM  LOS: 4  Clinical Milestones: copd, effusion   Anticipated discharge needs: tbd      Subjective     CC: Hypoxia    HPI/Subjective: 83 year old male with past medical history significant for COPD, chronic emphysema, CAD, paroxysmal A-fib on warfarin, OSA, GERD, hypertension, hyperlipidemia, diabetes, presented to hospital on 1/5 with dyspnea  At presentation blood pressure was 130/74 heart rate 116 temperature 98.7 saturating 87% on room air  Blood work showed WBC of 15, hemoglobin of 15.7 platelet count 367  BMP was grossly unremarkable except blood glucose of 220, lactic acid of 3.2, BNP 1000 INR 2.9  Chest x-ray showed moderate left pleural effusion  Admitted to hospitalist service with acute hypoxic respiratory failure secondary to left-sided pneumonia and effusion, started on cefepime  and  azithromycin   [facility] pulmonology was consulted for possible thoracentesis  His warfarin was held, he was started on enoxaparin   Pulmonology recommended thoracentesis when INR is less than 1.5  Underwent thoracentesis on 1/7, 1.3 L was removed  Repeat chest x-ray showed improved but persistent effusion  1/9, on 0.5 L O2 via NC. BG up to 300s. Repeat chest XR is slightly better but still has effusion. Cytology pending    On 1/9 patient was seen at 9:40 AM with nurse Elyn  No acute events noted overnight  This morning patient seems to be stable, denies chest pain, dyspnea, abdominal pain      Review of Systems:     As per HPI    Physical Exam:     VITAL SIGNS PHYSICAL EXAM   Temp:  [97.5 F (36.4 C)-98.2 F (36.8 C)] 97.5 F (36.4 C)  Heart Rate:  [66-121] 121  Resp Rate:  [16-19] 17  BP: (103-155)/(67-88) 103/68  Blood Glucose:    Telemetry:       Intake/Output Summary (Last 24 hours) at 09/14/2023 1218  Last data filed at 09/13/2023 2228  Gross per 24 hour   Intake --   Output 100 ml   Net -100 ml    Physical Exam  General: Well developed, well nourished, obese elderly male, in no apparent distress  Head and Neck: Atraumatic, normocephalic, PERRL, extraocular movements intact, no scleral icterus, wearing nasal cannula  Cardiovascular: Irregularly irregular rate and rhythm, no murmurs  Lungs: Decreased breath sounds at the bases bilaterally, no wheezing  Abdomen: Soft, non-tender, non-distended  Extremities: No lower extremity edema  Neuro: Awake, alert, oriented x3         Meds:     Medications were reviewed:  Current Facility-Administered Medications   Medication Dose Route Frequency    albuterol -ipratropium  3 mL Nebulization Q6H WA    allopurinol   300 mg Oral Daily    amLODIPine   5 mg Oral Daily    atorvastatin   40 mg Oral Daily    cefepime   2 g Intravenous Q8H    fluticasone  furoate-vilanterol  1 puff Inhalation QAM    furosemide   20 mg Oral Daily    hydroCHLOROthiazide   25 mg Oral Daily    insulin  lispro  1-5  Units Subcutaneous TID AC    isosorbide  mononitrate  30 mg Oral Daily    lisinopril   40 mg Oral Daily    metFORMIN   1,000 mg Oral BID Meals    metoprolol  succinate  100 mg Oral Daily    warfarin  7.5 mg Oral Daily at 1800    warfarin   Oral See Admin Instructions     Infusion Meds[1]  PRN Medications[2]  Labs:     Labs (last 72 hours):    Recent Labs   Lab 09/13/23  0449 09/11/23  1405   WBC 14.36* 17.55*   Hemoglobin 13.2 13.7   Hematocrit 39.6 42.0   Platelet Count 288 340       Recent Labs   Lab 09/14/23  1058 09/13/23  0449   PT 14.9* 14.1*   INR 1.3 1.3    Recent Labs   Lab 09/13/23  0449 09/12/23  0354 09/11/23  0617 09/10/23  1946   Sodium 136 141 141 140   Potassium 3.9 3.3* 4.1 3.9   Chloride 105 104 103 101   CO2 27 27 23 26    BUN 21 21 28  29*   Creatinine 0.8 0.8 0.9 0.9   Calcium  9.1 9.4 9.5 10.5*   Albumin   --   --  2.4* 3.0*   Protein, Total  --   --  6.5 8.2   Bilirubin, Total  --   --  0.6 0.8   Alkaline Phosphatase  --   --  141* 192*   ALT  --   --  17 25   AST (SGOT)  --   --  43* 36   Glucose 218* 202* 188* 220*                   Microbiology, reviewed and are significant for:  Microbiology Results (last 15 days)       Procedure Component Value Units Date/Time    Culture and Gram Stain, Aerobic and Anaerobic Bacteria, Wound/Tissue/Fluid (Order) [[id number]] Collected: 09/12/23 1131    Order Status: Sent Specimen: Pleural Fluid from Pleural Cavity, Left Updated: 09/12/23 1147    Narrative:      The following orders were created for panel order Culture and Gram Stain, Aerobic and Anaerobic Bacteria, Wound/Tissue/Fluid (Order).  Procedure                               Abnormality         Status                     ---------                               -----------         ------                     Culture And Gram Stain,.SABRASABRA[[id number]]                      Preliminary result           Please view results for these tests on the individual orders.    Culture And Gram Stain, Aerobic Bacteria,  Wound/Tissue/Fluid [[id number]] Collected: 09/12/23 1131    Order Status: Completed Specimen: Pleural Fluid from Pleural Cavity, Left Updated: 09/13/23 1710     Culture Aerobic Bacteria No growth to date.  Final report to follow.     Gram Stain Many WBCs      No organisms seen    Narrative:      Stain performed on cytospin (concentrated) specimen.     Urine Streptococcus pneumoniae Antigen [[id number]]  (Normal) Collected: 09/11/23 0602    Order Status: Completed Specimen: Urine, Clean Catch Updated: 09/11/23 2055     Urine Streptococcus pneumoniae  Antigen Negative     Comment: This is a presumptive test for the direct qualitative detection of bacterial antigen. This test is not intended as a substitute for a gram stain and bacterial culture. Samples with extremely low levels of antigen may yield negative results.       Urine Legionella pneumophila serogroup 1 Antigen [[id number]]  (Normal) Collected: 09/11/23 0602    Order Status: Completed Specimen: Urine, Clean Catch Updated: 09/11/23 2053     Urine Legionella pneumophila serogroup 1 Antigen Negative     Comment: This test will not detect infections caused by other Legionella pneumophila serogroups or by other Legionella species. A negative antigen result does not exclude infection with Legionella pneumophila serogroup 1.       Nares, MRSA (methicillin-resistant Staphylococcus aureus) Screening, PCR [[id number]]  (Normal) Collected: 09/11/23 0143    Order Status: Completed Specimen: Swab from Nares Updated: 09/11/23 0426     MRSA (methicillin resistant Staphylococcus aureus) DNA Not Detected     Comment: Testing performed with the Xpert MRSA NxG assay. This test is intended for the detection of methicillin-resistant Staphylococcus aureus (MRSA) DNA. This test is not intended to diagnose, guide, or monitor treatment for MRSA infections, or provide results of susceptibility to methicillin. A positive test result does not necessarily indicate the presence of viable  organisms. A negative result does not preclude MRSA nasal colonization.       Culture, Sputum and Lower Respiratory [[id number]]     Order Status: Sent Specimen: Sputum, Expectorated     COVID-19 and Influenza ([place]) (symptomatic) [[id number]]  (Normal) Collected: 09/10/23 1946    Order Status: Completed Specimen: Swab from Anterior Nares Updated: 09/10/23 2019     SARS-CoV-2 (COVID-19) RNA Not Detected     Influenza A RNA Not Detected     Influenza B RNA Not Detected    Narrative:      A result of Detected indicates POSITIVE for the presence of viral RNA  A result of Not Detected indicates NEGATIVE for the presence of viral RNA    Test performed using the Roche cobas [place] SARS-CoV-2 & Influenza A/B assay. This is a multiplex real-time RT-PCR assay for the detection of SARS-CoV-2, influenza A, and influenza B virus RNA. Viral nucleic acids may persist in vivo, independent of viability. Detection of viral nucleic acid does not imply the presence of infectious virus, or that virus nucleic acid is the cause of clinical symptoms. Negative results do not preclude SARS-CoV-2, influenza A, and/or influenza B infection and should not be used as the sole basis for diagnosis, treatment or other patient management decisions. Invalid results may be due to inhibiting substances in the specimen and recollection should occur.     Culture, Blood, Aerobic And Anaerobic [[id number]] Collected: 09/10/23 1946    Order Status: Completed Specimen: Blood, Venous Updated: 09/13/23 2300     Culture Blood No growth at 3 days            Imaging, reviewed and are significant for:  CTA chest      Signed by: Darletta Bunting, MD        This note was generated within the EPIC EMR using Dragon medical speech recognition software and may contain inherent errors or omissions not intended by the user. Grammatical and punctuation errors, random word insertions, deletions, pronoun errors and incomplete sentences are occasional consequences of this  technology due to software limitations. Not all errors are caught or corrected. Although every attempt is made to root out  erroneus and incomplete transcription, the note may still not fully represent the intent or opinion of the author. If there are questions or concerns about the content of this note or information contained within the body of this dictation they should be addressed directly with the author for clarification. '       [1]   Current Facility-Administered Medications   Medication Dose Route Frequency Last Rate   [2]   Current Facility-Administered Medications   Medication Dose Route    acetaminophen   650 mg Oral    benzocaine -menthol   1 lozenge Buccal    benzonatate   100 mg Oral    calcium  carbonate  500 mg Oral    carboxymethylcellulose sodium  1 drop Both Eyes    dextrose   15 g of glucose Oral    Or    dextrose   12.5 g Intravenous    Or    dextrose   12.5 g Intravenous    Or    glucagon  (rDNA)  1 mg Intramuscular    dextrose   15 g of glucose Oral    Or    dextrose   12.5 g Intravenous    Or    dextrose   12.5 g Intravenous    Or    glucagon  (rDNA)  1 mg Intramuscular    magnesium  sulfate  1 g Intravenous    melatonin  3 mg Oral    naloxone   0.2 mg Intravenous    potassium & sodium phosphates   2 packet Oral    potassium chloride   0-60 mEq Oral    Or    potassium chloride   0-60 mEq Oral    Or    potassium chloride   10 mEq Intravenous    saline  2 spray Each Nare

## 2023-09-14 NOTE — Progress Notes (Addendum)
 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 Paroxysmal atrial fib-on coumadin  for 20 years  4. CAD-s/p CABG in 2005  5. OSA-not on CPAP currently  6. HTN/Hyperlipidemia/DM  7. Family history of HIT, son, not pt  8. Elevated lactic acid but not sure he really has an infection-short term antibiotics for now      Plan:   Continue antibiotics  Continue nebs  O2 as needed  Will FU cytology  Will arrange FU with our office in [place]    I spent 35 minutes with the patient today.    Pardeep Pautz C Burns Timson, PA  09/14/23  2:08 PM      Interval History     Interval History/24 hr. Events:  On room air. Passed ambulatory oximetry.    Review of Systems:   Reviewed and no changes      Physical Exam:     VITAL SIGNS PHYSICAL EXAM   Vitals:    09/14/23 1157   BP: 103/68   Pulse: (!) 121   Resp: 17   Temp: 97.5 F (36.4 C)   SpO2: 94%     Temp (24hrs), Avg:97.9 F (36.6 C), Min:97.5 F (36.4 C), Max:98.2 F (36.8 C)                SpO2: 94 % (09/14/2023 11:57 AM)  O2 Device: None (Room air) (09/14/2023 11:57 AM)  O2 Flow Rate (L/min): 0.5 L/min (09/14/2023  7:20 AM)     Physical Exam  General: awake, alert, breathing comfortably, no acute distress  Cardiovascular: RRR  Neck: No JVD  Lungs: Diminished over left lower lung base  Abdomen: soft, non-tender, non-distended; no palpable masses,  normoactive bowel sounds  Extremities: Trace edema BLE  Pulse: equal pulses,  Neurological: Alert and oriented X3, mood and affect normal         Meds:     Current  Facility-Administered Medications   Medication Dose Route Frequency    albuterol -ipratropium  3 mL Nebulization Q6H WA    allopurinol   300 mg Oral Daily    amLODIPine   5 mg Oral Daily    atorvastatin   40 mg Oral Daily    cefepime   2 g Intravenous Q8H    fluticasone  furoate-vilanterol  1 puff Inhalation QAM    furosemide   20 mg Oral Daily    hydroCHLOROthiazide   25 mg Oral Daily    insulin  lispro  1-5 Units Subcutaneous TID AC    isosorbide  mononitrate  30 mg Oral Daily    lisinopril   40 mg Oral Daily    metFORMIN   1,000 mg Oral BID Meals    metoprolol  succinate  100 mg Oral Daily    warfarin  5 mg Oral Once    warfarin   Oral See Admin Instructions       Labs:     Recent Labs   Lab 09/13/23  0449 09/12/23  0354 09/11/23  1405 09/11/23  0617 09/10/23  1946   WBC 14.36*  --  17.55* 16.11* 15.16*   RBC 4.31  --  4.56 4.33 5.12   Hemoglobin 13.2  --  13.7 13.2 15.7   Hematocrit 39.6  --  42.0 39.6 47.1   Glucose 218* 202*  --  188* 220*   BUN 21 21  --  28 29*   Creatinine 0.8 0.8  --  0.9 0.9   Calcium  9.1 9.4  --  9.5 10.5*   Sodium 136 141  --  141 140   Potassium 3.9 3.3*  --  4.1 3.9   Chloride 105 104  --  103 101   CO2 27 27  --  23 26           Imaging:         Outside imaging and imaging available in Epic were directly reviewed     CXR post tap  1.Slight improvement of moderate to large left pleural  effusion/atelectasis. Superimposed pneumonia is less likely, but cannot be  excluded.  2.Stable retrocardiac atelectasis.      Signed by: Aaylah Pokorny C Rodrick Payson, PA    [facility]  Spectra  [phone]

## 2023-09-14 NOTE — Progress Notes (Signed)
 CASE MANAGEMENT PROGRESS NOTE:    Patient: Carl [name], Carl [name] (84 y.o., male)  Admission Date: 09/10/2023  Hospital Day: 4  Admission Diagnosis:Hypoxia [R09.02]  Pneumonia of left lower lobe due to infectious organism [J18.9]  Sepsis, due to unspecified organism, unspecified whether acute organ dysfunction present [A41.9]      Summary of Discharge Plan:  Home with [facility] and OP MD f/u    Identified Possible Discharge Barriers:  O2- weaned today  [facility]  Transportation home  Pending cytology, pulm is following  CXR    CM Interventions and Outcome:  CM/RN/MD bedside rounds completed. Patient weaned to room air today and walking, does not need home o2. CM placed [facility] order for  [facility] SN/PT in anticipation [place] tomorrow. Pending cytology and CXR as noted above.       Discussed above Discharge Plan with (patient, family, Care Team, others):  Care team, patient    CM will continue to identify and address  [place] planning needs and any potential barriers.      Abigail Monks, BSN, RN  RN Case Manager 1  Case Management  [employer]  [phone]  [email or web address]

## 2023-09-15 ENCOUNTER — Inpatient Hospital Stay: Payer: Medicare Other

## 2023-09-15 ENCOUNTER — Telehealth (INDEPENDENT_AMBULATORY_CARE_PROVIDER_SITE_OTHER): Payer: Self-pay

## 2023-09-15 ENCOUNTER — Inpatient Hospital Stay (HOSPITAL_COMMUNITY): Payer: Medicare Other

## 2023-09-15 DIAGNOSIS — I10 Essential (primary) hypertension: Secondary | ICD-10-CM

## 2023-09-15 LAB — WHOLE BLOOD GLUCOSE POCT
Whole Blood Glucose POCT: 240 mg/dL — ABNORMAL HIGH (ref 70–100)
Whole Blood Glucose POCT: 252 mg/dL — ABNORMAL HIGH (ref 70–100)
Whole Blood Glucose POCT: 231 mg/dL — ABNORMAL HIGH (ref 70–100)

## 2023-09-15 LAB — ECHO ADULT TTE COMPLETE
AV Area (Cont Eq VTI): 1.542
AV Mean Gradient: 5
AV Peak Velocity: 1.56
Ao Root Diameter (2D): 3
BP Mod LV Ejection Fraction: 73
IVS Diastolic Thickness (2D): 1.1
LA Dimension (2D): 4.3
LVID diastole (2D): 3.9
LVID systole (2D): 2.4
MV E/e' (Average): 11.303
Pulmonary Valve Findings: NORMAL
RV Systolic Pressure: 32.206
TAPSE: 1.07
Tricuspid Valve Findings: NORMAL

## 2023-09-15 LAB — PROCALCITONIN: Procalcitonin: 0.07 ng/mL (ref 0.00–0.10)

## 2023-09-15 LAB — APTT: PTT: 33 s (ref 27–39)

## 2023-09-15 LAB — MAGNESIUM: Magnesium: 1.7 mg/dL (ref 1.6–2.6)

## 2023-09-15 LAB — BASIC METABOLIC PANEL
Anion Gap: 12 (ref 5.0–15.0)
Anion Gap: 11 (ref 5.0–15.0)
BUN: 19 mg/dL (ref 9–28)
BUN: 23 mg/dL (ref 9–28)
CO2: 25 meq/L (ref 17–29)
CO2: 28 meq/L (ref 17–29)
Calcium: 10.5 mg/dL — ABNORMAL HIGH (ref 7.9–10.2)
Calcium: 10.3 mg/dL — ABNORMAL HIGH (ref 7.9–10.2)
Chloride: 100 meq/L (ref 99–111)
Chloride: 99 meq/L (ref 99–111)
Creatinine: 0.8 mg/dL (ref 0.5–1.5)
Creatinine: 0.9 mg/dL (ref 0.5–1.5)
GFR: >60 mL/min/{1.73_m2} (ref >=60.0)
GFR: >60 mL/min/{1.73_m2} (ref >=60.0)
Glucose: 241 mg/dL — ABNORMAL HIGH (ref 70–100)
Glucose: 250 mg/dL — ABNORMAL HIGH (ref 70–100)
Potassium: 4.4 meq/L (ref 3.5–5.3)
Potassium: 4.1 meq/L (ref 3.5–5.3)
Sodium: 137 meq/L (ref 135–145)
Sodium: 138 meq/L (ref 135–145)

## 2023-09-15 LAB — PT/INR
INR: 1.3 (ref 0.9–1.1)
INR: 1.4 (ref 0.9–1.1)
INR: 1.4 (ref 0.9–1.1)
PT: 15 s — ABNORMAL HIGH (ref 10.1–12.9)
PT: 16.2 s — ABNORMAL HIGH (ref 10.1–12.9)
PT: 16.1 s — ABNORMAL HIGH (ref 10.1–12.9)

## 2023-09-15 LAB — CBC
Absolute nRBC: 0 10*3/uL (ref <=0.00)
Hematocrit: 43.3 % (ref 37.6–49.6)
Hemoglobin: 14.4 g/dL (ref 12.5–17.1)
MCH: 30.5 pg (ref 25.1–33.5)
MCHC: 33.3 g/dL (ref 31.5–35.8)
MCV: 91.7 fL (ref 78.0–96.0)
MPV: 12 fL (ref 8.9–12.5)
Platelet Count: 367 10*3/uL — ABNORMAL HIGH (ref 142–346)
RBC: 4.72 10*6/uL (ref 4.20–5.90)
RDW: 14 % (ref 11–15)
WBC: 16.14 10*3/uL — ABNORMAL HIGH (ref 3.10–9.50)
nRBC %: 0 /100{WBCs} (ref <=0.0)

## 2023-09-15 LAB — C-REACTIVE PROTEIN: C-Reactive Protein: 10.3 mg/dL — ABNORMAL HIGH (ref 0.0–1.1)

## 2023-09-15 LAB — CULTURE BLOOD AEROBIC AND ANAEROBIC: Culture Blood: NO GROWTH

## 2023-09-15 LAB — NT-PROBNP: NT-ProBNP: 1326 pg/mL — ABNORMAL HIGH (ref <450)

## 2023-09-15 LAB — D-DIMER: D-Dimer: 1.54 ug{FEU}/mL — ABNORMAL HIGH (ref <0.50)

## 2023-09-15 MED ORDER — ENOXAPARIN SODIUM 100 MG/ML IJ SOSY
1.0000 mg/kg | PREFILLED_SYRINGE | Freq: Two times a day (BID) | INTRAMUSCULAR | Status: DC
Start: 2023-09-15 — End: 2023-09-20
  Administered 2023-09-15 – 2023-09-18 (×6): 90 mg via SUBCUTANEOUS
  Filled 2023-09-15 (×3): qty 1
  Filled 2023-09-15: qty 2
  Filled 2023-09-15 (×7): qty 1

## 2023-09-15 MED ORDER — INSULIN GLARGINE 100 UNIT/ML SC SOLN
10.0000 [IU] | Freq: Every morning | SUBCUTANEOUS | Status: DC
Start: 2023-09-15 — End: 2023-09-20
  Administered 2023-09-15 – 2023-09-18 (×4): 10 [IU] via SUBCUTANEOUS
  Administered 2023-09-19: 5 [IU] via SUBCUTANEOUS
  Administered 2023-09-20: 10 [IU] via SUBCUTANEOUS
  Filled 2023-09-15 (×2): qty 10
  Filled 2023-09-15: qty 5
  Filled 2023-09-15 (×2): qty 10

## 2023-09-15 MED ORDER — FUROSEMIDE 10 MG/ML IJ SOLN
40.0000 mg | Freq: Every day | INTRAMUSCULAR | Status: DC
Start: 2023-09-15 — End: 2023-09-16
  Administered 2023-09-15 – 2023-09-16 (×2): 40 mg via INTRAVENOUS
  Filled 2023-09-15 (×2): qty 4

## 2023-09-15 MED ORDER — SULFUR HEXAFLUORIDE MICROSPH 60.7-25 MG IJ SUSR
5.0000 mL | Freq: Once | INTRAMUSCULAR | Status: AC
Start: 2023-09-15 — End: 2023-09-15
  Administered 2023-09-15: 5 mL via INTRAVENOUS

## 2023-09-15 MED ORDER — WARFARIN SODIUM 2 MG PO TABS
5.0000 mg | ORAL_TABLET | ORAL | Status: DC
Start: 2023-09-16 — End: 2023-09-15

## 2023-09-15 MED ORDER — WARFARIN SODIUM 2.5 MG PO TABS
2.5000 mg | ORAL_TABLET | ORAL | Status: DC
Start: 2023-09-15 — End: 2023-09-15
  Filled 2023-09-15: qty 1

## 2023-09-15 MED ORDER — IOHEXOL 350 MG/ML IV SOLN
80.0000 mL | Freq: Once | INTRAVENOUS | Status: AC | PRN
Start: 2023-09-15 — End: 2023-09-15
  Administered 2023-09-15: 80 mL via INTRAVENOUS

## 2023-09-15 NOTE — Progress Notes (Signed)
 PULMONARY INPATIENT PROGRESS NOTE  [phone] (MD Spectralink #1) / [phone] (MD Spectralink #2) / [phone] (PA Spectralink)        Date Time: 09/15/23 10:43 AM  Patient Name: Carl [name]      Assessment:     Left effusion-could be related to CAP but worrisome for metastatic malignancy as associated mediastinal mass/consolidation and nodules              -Status post left thoracentesis 1/7 and maybe some slight progression over night   -cytology is pending, fluid looks exudative, 3955 WBC 55% PMNs   -cultures negative so far   -Elevated L hemidiaphragm  2. COPD-continue nebs/Breo  3. Paroxysmal atrial fib-on coumadin  for 20 years  4. CAD-s/p CABG in 2005  5. OSA-not on CPAP currently  6. HTN/Hyperlipidemia/DM  7. Family history of HIT, son, not pt    Plan:   Continue cefepime  for now  Continue nebs  O2 as needed  Cytology will be back later today  Will re-ultrasound chest -only looks a little worse to me  Agree with echo  Coumadin  to be given at 6 pm, INR 1.3 last pm, will need to recheck if he needs another procedure    Leeroy JAYSON Citizen, MD  09/15/23  10:43 AM      Interval History     Interval History/24 hr. Events:  Was on RA yesterday, more short of breath overnight    Review of Systems:   Reviewed and no changes      Physical Exam:     VITAL SIGNS PHYSICAL EXAM   Vitals:    09/15/23 0803   BP: 131/79   Pulse:    Resp: 19   Temp: 98 F (36.7 C)   SpO2: 96%     Temp (24hrs), Avg:98.3 F (36.8 C), Min:97.5 F (36.4 C), Max:99.9 F (37.7 C)                SpO2: 96 % (09/15/2023  8:03 AM)  O2 Device: Nasal cannula (09/15/2023  3:45 AM)  O2 Flow Rate (L/min): 3 L/min (09/15/2023  3:45 AM)     Physical Exam  General: awake, alert, breathing comfortably, no acute distress  Cardiovascular: RRR  Neck: No JVD  Lungs: Diminished over left lower lung base, dull 1/2 up  Abdomen: soft, non-tender, non-distended; no palpable masses,  normoactive bowel sounds  Extremities: Trace edema BLE  Pulse: equal  pulses,  Neurological: Alert and oriented X3, mood and affect normal         Meds:     Current Facility-Administered Medications   Medication Dose Route Frequency    albuterol -ipratropium  3 mL Nebulization Q6H WA    allopurinol   300 mg Oral Daily    amLODIPine   5 mg Oral Daily    atorvastatin   40 mg Oral Daily    cefepime   2 g Intravenous Q8H    fluticasone  furoate-vilanterol  1 puff Inhalation QAM    furosemide   40 mg Intravenous Daily    hydroCHLOROthiazide   25 mg Oral Daily    insulin  glargine  10 Units Subcutaneous QAM    insulin  lispro  1-5 Units Subcutaneous TID AC    isosorbide  mononitrate  30 mg Oral Daily    lisinopril   40 mg Oral Daily    metFORMIN   1,000 mg Oral BID Meals    metoprolol  succinate  100 mg Oral Daily    warfarin   Oral See Admin Instructions       Labs:  Recent Labs   Lab 09/15/23  0348 09/13/23  0449 09/12/23  0354 09/11/23  1405 09/11/23  0617   WBC 16.14* 14.36*  --  17.55* 16.11*   RBC 4.72 4.31  --  4.56 4.33   Hemoglobin 14.4 13.2  --  13.7 13.2   Hematocrit 43.3 39.6  --  42.0 39.6   Glucose 241* 218* 202*  --  188*   BUN 19 21 21   --  28   Creatinine 0.8 0.8 0.8  --  0.9   Calcium  10.5* 9.1 9.4  --  9.5   Sodium 137 136 141  --  141   Potassium 4.4 3.9 3.3*  --  4.1   Chloride 100 105 104  --  103   CO2 25 27 27   --  23   Platelets 367K        Imaging:         Outside imaging and imaging available in Epic were directly reviewed     CXR 09/14/22  Interval progression of a left-sided pleural effusion with associated  atelectasis and/or pneumonia.       CXR post tap  1.Slight improvement of moderate to large left pleural  effusion/atelectasis. Superimposed pneumonia is less likely, but cannot be  excluded.  2.Stable retrocardiac atelectasis.      Signed by: Leeroy JAYSON Citizen, MD    [facility]  Spectra  [phone]

## 2023-09-15 NOTE — Nursing Progress Note (Addendum)
 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 Pulm and Oncology updated patient and patient's son regarding results.   - D-dimer elevated, STAT CTA ,   01/10 1819 CT Angiogram Chest  Images   01/10 1402 Echo Adult TTE Complete  Images                                                                LDA: G18 left hand , G22 RUA     Abnormal labs/VS (needing any orders):  Magnesium  1.7 replaced, D-dimer 1.54,                                                                        PT INR 1.4 coumadin  d/c as ordered  Monitor for signs and symptoms of bleeding, needs weigh patient   High Blood sugars, Lantus  commenced  SOB overnight, Lasix  po switch to iv given, no SOB and no dyspnea noted during the day.                                                                          Neuro changes: A/O x4     Respiratory: 3L oxygen NC                           GI/GU: Continent x2, LBM 1/10     Nutrition: Regular, Consistent Carb     Activity/ Safety/ Mobility:      Questions (need for PT/OT, sitter): independent      Tele/ Rhythm: no tele     Skin/ Wounds: scattered bruising, BLE +1 edema      Case management needs:   Discharge plan: Home w [facility]   Discharge barriers: TBD

## 2023-09-15 NOTE — Progress Notes (Addendum)
 MEDICINE PROGRESS NOTE    Date Time: 09/15/23 1:33 PM  Patient Name: Carl [name]  Attending Physician: Boneta Kingfisher, MD    Assessment / Plan:   83 year old male with past medical history significant for COPD, chronic emphysema, CAD, paroxysmal A-fib on warfarin, OSA, GERD, hypertension, hyperlipidemia, diabetes, presented to hospital on 1/5 with dyspnea, found to have hypoxic respiratory failure secondary to left lung pneumonia and left lung effusion    #Acute hypoxic respiratory failure, currently on 3 L of oxygen via nasal cannula.  Repeat chest x-ray showed effusion but bedside ultrasound does not support this.  Need to rule out PE as well, as D-dimer is elevated, ordered CTA chest.  Getting echocardiogram for possible CHF, transition to IV diuresis.     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  Consulted oncology, ordered CT abdomen pelvis with and without contrast for 1/11 (getting IV contrast for CTA today) and MRI brain for 1/12    #Left lower lobe pneumonia, patient remains on cefepime .  His repeat procalcitonin is negative preliminary report of pleural fluid culture is not demonstrating any organisms.  His blood cultures has been negative to date    #Mediastinal lymphadenopathy, following up with cytology of pleural effusion to rule out malignancy    #COPD, not in exacerbation, remains on Breo Ellipta , DuoNebs every 6 hours, [place] pulmonology is following as well.  Received dupixent  300 mg on 1/7     #Left pleural effusion, underwent thoracentesis by [facility] pulmonology on 1/7, 1.3 L was drained, repeat chest x-ray showed improved but persistent effusion.  Repeat ultrasound reported worsening effusion but bedside ultrasound does not support this.  Will transition to IV furosemide  40 mg daily and will get echocardiogram.      #Paroxysmal A-fib, on warfarin as outpatient, he was started on heparin   drip due to planned thoracentesis.  Warfarin resumed on 1/8, managed by pharmacy.  INR is 1.4 today.  Patient is in RVR with rates around 110s.      #Diabetes, A1c 7.4, on glipizide  and metformin  at home, here he became more hyperglycemic in the last 2 days.  Resumed metformin , also started on Lantus  10 units daily and continue with sliding scale coverage.      #CAD on metoprolol , atorvastatin     #HFpEF, likely in mild exacerbation, BNP elevated to 1300.  On metoprolol  succinate,  lisinopril  40 mg daily, transitioning to IV diuresis today.  Repeating echo    #Essential hypertension on amlodipine  5 mg daily metoprolol  succinate 100 mg daily, lisinopril  40 mg p.o. daily, Imdur  30 mg daily    #Hypokalemia, potassium is better after repletion    #Hypomagnesemia, magnesium  1.7, received repletion    For DVT prophylaxis, on warfarin  He is full code  Remains hospitalized for today, likely will remain hospitalized for 2 more days   Son was updated this morning  Discussed with pulmonology    Kingfisher Boneta, MD        Additional Diagnoses:                 Safety Checklist:     DVT prophylaxis:  CHEST guideline (See page [id number]) Chemical   Foley:  [facility] Rn Foley protocol Not present   IVs:  Peripheral IV   PT/OT: Ordered   Daily CBC & or Chem ordered:  SHM/ABIM guidelines (see #5) No   Reference for approximate charges of common labs: CBC auto diff - $76  BMP - $99  Mg - $79    Lines:     Patient Lines/Drains/Airways Status       Active PICC Line / CVC Line / PIV Line / Drain / Airway / Intraosseous Line / Epidural Line / ART Line / Line / Wound / Pressure Ulcer / NG/OG Tube       Name Placement date Placement time Site Days    Peripheral IV 09/10/23 22 G Diffusion Left Antecubital 09/10/23  1954  Antecubital  1                     Disposition: (Please see PAF column for Expected D/C Date)   Today's date: 09/15/2023  Admit Date: 09/10/2023  7:27 PM  LOS: 5  Clinical Milestones: copd, effusion   Anticipated discharge needs:  tbd      Subjective     CC: Hypoxia    HPI/Subjective: 83 year old male with past medical history significant for COPD, chronic emphysema, CAD, paroxysmal A-fib on warfarin, OSA, GERD, hypertension, hyperlipidemia, diabetes, presented to hospital on 1/5 with dyspnea  At presentation blood pressure was 130/74 heart rate 116 temperature 98.7 saturating 87% on room air  Blood work showed WBC of 15, hemoglobin of 15.7 platelet count 367  BMP was grossly unremarkable except blood glucose of 220, lactic acid of 3.2, BNP 1000 INR 2.9  Chest x-ray showed moderate left pleural effusion  Admitted to hospitalist service with acute hypoxic respiratory failure secondary to left-sided pneumonia and effusion, started on cefepime  and azithromycin   [facility] pulmonology was consulted for possible thoracentesis  His warfarin was held, he was started on enoxaparin   Pulmonology recommended thoracentesis when INR is less than 1.5  Underwent thoracentesis on 1/7, 1.3 L was removed  Repeat chest x-ray showed improved but persistent effusion  1/9, on 0.5 L O2 via NC. BG up to 300s. Repeat chest XR is slightly better but still has effusion. Cytology pending  1/10, on 3 L, repeat chest x-ray shows persistent effusion.  Discussed with pulmonology, bedside ultrasound does not show significant effusion.  Getting D-dimer, if elevated will have CTA chest to rule out PE.  Switch furosemide  to IV.  Ordered echocardiogram and BNP.  Started on Lantus  10 units    On 1/10 patient was seen at 9:45 AM with nurse Elyn  No acute events noted overnight, he was started on oxygen again  This morning patient seems to be stable, denies chest pain, dyspnea, abdominal pain      Review of Systems:     As per HPI    Physical Exam:     VITAL SIGNS PHYSICAL EXAM   Temp:  [97.5 F (36.4 C)-99.9 F (37.7 C)] 98 F (36.7 C)  Heart Rate:  [111-121] 118  Resp Rate:  [16-19] 17  BP: (95-146)/(60-83) 95/60  Blood Glucose:    Telemetry:     No intake or output data in the 24  hours ending 09/15/23 1333   Physical Exam  General: Well developed, well nourished, obese elderly male, in no apparent distress  Head and Neck: Atraumatic, normocephalic, PERRL, extraocular movements intact, no scleral icterus, wearing nasal cannula  Cardiovascular: Irregularly irregular rate and rhythm, no murmurs  Lungs: Decreased breath sounds at the bases bilaterally, no wheezing  Abdomen: Soft, non-tender, non-distended  Extremities: Left lower extremity is more swollen than right lower extremity  Neuro: Awake, alert, oriented x3         Meds:     Medications were reviewed:  Current Facility-Administered Medications   Medication Dose Route Frequency    albuterol -ipratropium  3 mL Nebulization Q6H WA    allopurinol   300 mg Oral Daily    amLODIPine   5 mg Oral Daily    atorvastatin   40 mg Oral Daily    cefepime   2 g Intravenous Q8H    fluticasone  furoate-vilanterol  1 puff Inhalation QAM    furosemide   40 mg Intravenous Daily    hydroCHLOROthiazide   25 mg Oral Daily    insulin  glargine  10 Units Subcutaneous QAM    insulin  lispro  1-5 Units Subcutaneous TID AC    isosorbide  mononitrate  30 mg Oral Daily    lisinopril   40 mg Oral Daily    metFORMIN   1,000 mg Oral BID Meals    metoprolol  succinate  100 mg Oral Daily    warfarin  2.5 mg Oral Q Fri (PM)    [START ON 09/16/2023] warfarin  5 mg Oral Q Sun/Mon/Tue/Wed/Thu/Sat (PM)    warfarin   Oral See Admin Instructions     Infusion Meds[1]  PRN Medications[2]      Labs:     Labs (last 72 hours):    Recent Labs   Lab 09/15/23  0348 09/13/23  0449   WBC 16.14* 14.36*   Hemoglobin 14.4 13.2   Hematocrit 43.3 39.6   Platelet Count 367* 288       Recent Labs   Lab 09/15/23  1303 09/15/23  0348   PT 16.2* 15.0*   INR 1.4 1.3    Recent Labs   Lab 09/15/23  0348 09/13/23  0449 09/12/23  0354 09/11/23  0617 09/10/23  1946   Sodium 137 136  More results in Results Review 141 140   Potassium 4.4 3.9  More results in Results Review 4.1 3.9   Chloride 100 105  More results in  Results Review 103 101   CO2 25 27  More results in Results Review 23 26   BUN 19 21  More results in Results Review 28 29*   Creatinine 0.8 0.8  More results in Results Review 0.9 0.9   Calcium  10.5* 9.1  More results in Results Review 9.5 10.5*   Albumin   --   --   --  2.4* 3.0*   Protein, Total  --   --   --  6.5 8.2   Bilirubin, Total  --   --   --  0.6 0.8   Alkaline Phosphatase  --   --   --  141* 192*   ALT  --   --   --  17 25   AST (SGOT)  --   --   --  43* 36   Glucose 241* 218*  More results in Results Review 188* 220*   More results in Results Review = values in this interval not displayed.                   Microbiology, reviewed and are significant for:  Microbiology Results (last 15 days)       Procedure Component Value Units Date/Time    Culture and Gram Stain, Aerobic and Anaerobic Bacteria, Wound/Tissue/Fluid (Order) [[id number]] Collected: 09/12/23 1131    Order Status: Sent Specimen: Pleural Fluid from Pleural Cavity, Left Updated: 09/12/23 1147    Narrative:      The following orders were created for panel order Culture and Gram Stain, Aerobic and Anaerobic Bacteria, Wound/Tissue/Fluid (Order).  Procedure  Abnormality         Status                     ---------                               -----------         ------                     Culture And Gram Stain,.SABRASABRA[[id number]]                      Preliminary result           Please view results for these tests on the individual orders.    Culture And Gram Stain, Aerobic Bacteria, Wound/Tissue/Fluid [[id number]] Collected: 09/12/23 1131    Order Status: Completed Specimen: Pleural Fluid from Pleural Cavity, Left Updated: 09/14/23 1802     Culture Aerobic Bacteria No growth to date.  Final report to follow.     Gram Stain Many WBCs      No organisms seen    Narrative:      Stain performed on cytospin (concentrated) specimen.     Urine Streptococcus pneumoniae Antigen [[id number]]  (Normal) Collected: 09/11/23 0602     Order Status: Completed Specimen: Urine, Clean Catch Updated: 09/11/23 2055     Urine Streptococcus pneumoniae Antigen Negative     Comment: This is a presumptive test for the direct qualitative detection of bacterial antigen. This test is not intended as a substitute for a gram stain and bacterial culture. Samples with extremely low levels of antigen may yield negative results.       Urine Legionella pneumophila serogroup 1 Antigen [[id number]]  (Normal) Collected: 09/11/23 0602    Order Status: Completed Specimen: Urine, Clean Catch Updated: 09/11/23 2053     Urine Legionella pneumophila serogroup 1 Antigen Negative     Comment: This test will not detect infections caused by other Legionella pneumophila serogroups or by other Legionella species. A negative antigen result does not exclude infection with Legionella pneumophila serogroup 1.       Nares, MRSA (methicillin-resistant Staphylococcus aureus) Screening, PCR [[id number]]  (Normal) Collected: 09/11/23 0143    Order Status: Completed Specimen: Swab from Nares Updated: 09/11/23 0426     MRSA (methicillin resistant Staphylococcus aureus) DNA Not Detected     Comment: Testing performed with the Xpert MRSA NxG assay. This test is intended for the detection of methicillin-resistant Staphylococcus aureus (MRSA) DNA. This test is not intended to diagnose, guide, or monitor treatment for MRSA infections, or provide results of susceptibility to methicillin. A positive test result does not necessarily indicate the presence of viable organisms. A negative result does not preclude MRSA nasal colonization.       Culture, Sputum and Lower Respiratory [[id number]]     Order Status: Sent Specimen: Sputum, Expectorated     COVID-19 and Influenza ([place]) (symptomatic) [[id number]]  (Normal) Collected: 09/10/23 1946    Order Status: Completed Specimen: Swab from Anterior Nares Updated: 09/10/23 2019     SARS-CoV-2 (COVID-19) RNA Not Detected     Influenza A RNA Not Detected      Influenza B RNA Not Detected    Narrative:      A result of Detected indicates POSITIVE for the presence of viral RNA  A result of Not Detected indicates NEGATIVE for the presence of viral RNA  Test performed using the Roche cobas [place] SARS-CoV-2 & Influenza A/B assay. This is a multiplex real-time RT-PCR assay for the detection of SARS-CoV-2, influenza A, and influenza B virus RNA. Viral nucleic acids may persist in vivo, independent of viability. Detection of viral nucleic acid does not imply the presence of infectious virus, or that virus nucleic acid is the cause of clinical symptoms. Negative results do not preclude SARS-CoV-2, influenza A, and/or influenza B infection and should not be used as the sole basis for diagnosis, treatment or other patient management decisions. Invalid results may be due to inhibiting substances in the specimen and recollection should occur.     Culture, Blood, Aerobic And Anaerobic [[id number]] Collected: 09/10/23 1946    Order Status: Completed Specimen: Blood, Venous Updated: 09/14/23 2300     Culture Blood No growth at 4 days            Imaging, reviewed and are significant for:  CTA chest      Signed by: Darletta Bunting, MD        This note was generated within the [employer] EMR using Dragon medical speech recognition software and may contain inherent errors or omissions not intended by the user. Grammatical and punctuation errors, random word insertions, deletions, pronoun errors and incomplete sentences are occasional consequences of this technology due to software limitations. Not all errors are caught or corrected. Although every attempt is made to root out erroneus and incomplete transcription, the note may still not fully represent the intent or opinion of the author. If there are questions or concerns about the content of this note or information contained within the body of this dictation they should be addressed directly with the author for clarification. '         [1]    Current Facility-Administered Medications   Medication Dose Route Frequency Last Rate   [2]   Current Facility-Administered Medications   Medication Dose Route    acetaminophen   650 mg Oral    benzocaine -menthol   1 lozenge Buccal    benzonatate   100 mg Oral    calcium  carbonate  500 mg Oral    carboxymethylcellulose sodium  1 drop Both Eyes    dextrose   15 g of glucose Oral    Or    dextrose   12.5 g Intravenous    Or    dextrose   12.5 g Intravenous    Or    glucagon  (rDNA)  1 mg Intramuscular    dextrose   15 g of glucose Oral    Or    dextrose   12.5 g Intravenous    Or    dextrose   12.5 g Intravenous    Or    glucagon  (rDNA)  1 mg Intramuscular    magnesium  sulfate  1 g Intravenous    melatonin  3 mg Oral    naloxone   0.2 mg Intravenous    potassium & sodium phosphates   2 packet Oral    potassium chloride   0-60 mEq Oral    Or    potassium chloride   0-60 mEq Oral    Or    potassium chloride   10 mEq Intravenous    saline  2 spray Each Nare

## 2023-09-15 NOTE — Nursing Progress Note (Signed)
 Brief background: 83 year old male with past medical history significant for COPD, chronic emphysema, CAD, paroxysmal A-fib on warfarin, OSA, GERD, hypertension, hyperlipidemia, diabetes, presented to hospital on 1/5 with dyspnea, found to have hypoxic respiratory failure secondary to left lung pneumonia and left lung effusion      Last 24 Hrs (any critical events):   Patient on Room air at start of shift.   @0115 , patient complain of SOB, placed on 1L NC, vital stable, increased HR. Patient stated he took home inhaler dose at bedside with minimal effect. Patient educated to not further use home dose, patient verbalized understanding, cross cover MD aware.   @0140  patient complain of SOB increased to 2L, VSS, increased HR  @0345  pt complain of SOB increased to 3L, patient described SOB as hard to catch a full breath, VSS  Cross cover MD aware, CXR ordered  Safety measures in place, purposeful rounding maintained   Son at bedside overnight.     LDA: left hand PIV  Abnormal labs/VS (needing any orders): vitals stable, tachycardia, hx afib  PT 15.0  WBCs 16.14  CRP 10.3     Neuro changes: A/O x4  Respiratory: now on 3L NC  GI/GU: voids freely, LBM 1/8  Nutrition: Regular diet     Activity/ Safety/ Mobility  Questions (need for PT/OT, sitter): independent      Tele/ Rhythm: no tele  Skin/ Wounds: scattered bruising, BLE +1 edema      Case management needs: ambulatory pulse ox test   Discharge plan: Home w [facility]   Discharge barriers: Wean O2, IV abx, PACC for HH set up    Problem: Moderate/High Fall Risk Score >5  Goal: Patient will remain free of falls  Outcome: Progressing  Flowsheets (Taken 09/13/2023 2045)  High (Greater than 13): LOW-Fall Interventions Appropriate for Low Fall Risk     Problem: Compromised Sensory Perception  Goal: Sensory Perception Interventions  Outcome: Progressing  Flowsheets (Taken 09/15/2023 0236)  Sensory Perception Interventions: Offload heels, Pad bony prominences, Reposition q 2hrs/turn  Clock, Q2 hour skin assessment under devices if present     Problem: Inadequate Gas Exchange  Goal: Adequate oxygenation and improved ventilation  Outcome: Progressing  Flowsheets (Taken 09/15/2023 0236)  Adequate oxygenation and improved ventilation:   Assess lung sounds   Monitor SpO2 and treat as needed   Monitor and treat ETCO2   Provide mechanical and oxygen support to facilitate gas exchange   Increase activity as tolerated/progressive mobility   Plan activities to conserve energy: plan rest periods   Teach/reinforce use of incentive spirometer 10 times per hour while awake, cough and deep breath as needed   Position for maximum ventilatory efficiency

## 2023-09-15 NOTE — Progress Notes (Signed)
 WARFARIN MANAGEMENT    Carl [name] is a 83 y.o. male presenting with acute hypoxic respiratory failure 2/2 L lung PNA and L lung effusion. PMH of COPD, chronic emphysema, CAD s/p CABG 2005, paroxysmal Afib (on warfarin PTA), HTN, HLD, DM, OSA,GERD.   Pharmacy has been consulted to assist with warfarin dosing.     Indication: stroke prevention secondary to atrial fibrillation   INR goal: 2 - 3    This is a continuation of home warfarin.  Warfarin counseling is deferred at this time as it is a home medication. Pharmacy will counsel if requested.  Warfarin dose PTA: 2.5 mg on Fridays   5 mg daily on all other days   Per most recent [facility] clinic visit on 09/07/23, pt reports to have been taking warfarin differently than instructed. Instead, has been taking 2.5 mg on Tues/Thurs/Sun and 5 mg on all other days   Outpatient warfarin management team: [facility]      Recent Labs     09/15/23  0348 09/14/23  1058 09/13/23  0449   Hemoglobin 14.4  --  13.2   Hematocrit 43.3  --  39.6   Platelet Count 367*  --  288   INR 1.3 1.3 1.3     Recent Labs     09/15/23  0348 09/13/23  0449   Creatinine 0.8 0.8   BUN 19 21     Estimated Creatinine Clearance: 69.1 mL/min (based on SCr of 0.8 mg/dL).    ASSESSMENT   INR is subtherapeutic at 1.3. The INR has not changed  from yesterday's morning draw.  The patient is not indicated for bridging.  Potential drug-drug interactions include: allopurinol  (may enhance the anticoagulant effect of warfarin)   Diet: Regular diet; therapeutic/modified (consistent carbohydrate)    PLAN   Recommend to continue PTA maintenance dose.  2.5 mg on Fridays, 5 mg all other days , and will continue to monitor INR   Subtherapeutic INR may be transient - as patient received IV vitamin K 5 mg x 1 dose (on 1/6 at 1229) in anticipation for a procedure, and also missed a dose on 1/7.   The next INR level is ordered to be collected with AM labs.    Adult Inpatient Warfarin Dosing Guideline

## 2023-09-15 NOTE — Plan of Care (Addendum)
 Problem: Moderate/High Fall Risk Score >5  Goal: Patient will remain free of falls  Outcome: Progressing  Flowsheets (Taken 09/15/2023 1945)  Moderate Risk (6-13):   MOD-Floor mat at bedside (where available) if appropriate   MOD-Apply bed exit alarm if patient is confused   MOD-Remain with patient during toileting     Problem: Compromised Sensory Perception  Goal: Sensory Perception Interventions  Outcome: Progressing  Flowsheets (Taken 09/15/2023 1945)  Sensory Perception Interventions: Offload heels, Pad bony prominences, Reposition q 2hrs/turn Clock, Q2 hour skin assessment under devices if present     Problem: Compromised Activity/Mobility  Goal: Activity/Mobility Interventions  Outcome: Progressing  Flowsheets (Taken 09/15/2023 1945)  Activity/Mobility Interventions: Pad bony prominences, TAP Seated positioning system when OOB, Promote PMP, Reposition q 2 hrs / turn clock, Offload heels     Problem: Inadequate Gas Exchange  Goal: Adequate oxygenation and improved ventilation  Outcome: Progressing  Flowsheets (Taken 09/15/2023 1119 by Frances Lavena KIDD, RN)  Adequate oxygenation and improved ventilation:   Assess lung sounds   Monitor SpO2 and treat as needed   Provide mechanical and oxygen support to facilitate gas exchange   Position for maximum ventilatory efficiency   Teach/reinforce use of incentive spirometer 10 times per hour while awake, cough and deep breath as needed   Plan activities to conserve energy: plan rest periods   Increase activity as tolerated/progressive mobility   Consult/collaborate with Respiratory Therapy  Goal: Patent Airway maintained  Outcome: Progressing  Flowsheets (Taken 09/11/2023 2111 by Hyla Round, RN)  Patent airway maintained:   Position patient for maximum ventilatory efficiency   Provide adequate fluid intake to liquefy secretions   Suction secretions as needed   Reinforce use of ordered respiratory interventions (i.e. CPAP, BiPAP, Incentive Spirometer, Acapella,  etc.)   Reposition patient every 2 hours and as needed unless able to self-reposition     Problem: Compromised Moisture  Goal: Moisture level Interventions  Outcome: Progressing  Flowsheets (Taken 09/15/2023 1945)  Moisture level Interventions: Moisture wicking products, Moisture barrier cream

## 2023-09-15 NOTE — Plan of Care (Signed)
 Problem: Moderate/High Fall Risk Score >5  Goal: Patient will remain free of falls  Outcome: Progressing  Flowsheets (Taken 09/15/2023 0800)  Moderate Risk (6-13):   MOD-include family in multidisciplinary POC discussions   MOD-Perform dangle, stand, walk (DSW) prior to mobilization   MOD-Utilize diversion activities   MOD-Place bedside commode and assistive devices out of sight when not in use   MOD-Remain with patient during toileting   MOD-Floor mat at bedside (where available) if appropriate   MOD-Apply bed exit alarm if patient is confused   MOD-Consider activation of bed alarm if appropriate     Problem: Compromised Sensory Perception  Goal: Sensory Perception Interventions  Outcome: Progressing     Problem: Inadequate Gas Exchange  Goal: Adequate oxygenation and improved ventilation  Outcome: Progressing  Flowsheets (Taken 09/15/2023 1119)  Adequate oxygenation and improved ventilation:   Assess lung sounds   Monitor SpO2 and treat as needed   Provide mechanical and oxygen support to facilitate gas exchange   Position for maximum ventilatory efficiency   Teach/reinforce use of incentive spirometer 10 times per hour while awake, cough and deep breath as needed   Plan activities to conserve energy: plan rest periods   Increase activity as tolerated/progressive mobility   Consult/collaborate with Respiratory Therapy

## 2023-09-15 NOTE — Telephone Encounter (Signed)
 Lvm for pt to schedule an appt to see [email or web address] in 2-3 weeks if pt calls back lmk

## 2023-09-16 DIAGNOSIS — J449 Chronic obstructive pulmonary disease, unspecified: Secondary | ICD-10-CM

## 2023-09-16 DIAGNOSIS — C782 Secondary malignant neoplasm of pleura: Secondary | ICD-10-CM

## 2023-09-16 DIAGNOSIS — E119 Type 2 diabetes mellitus without complications: Secondary | ICD-10-CM

## 2023-09-16 LAB — CBC
Absolute nRBC: 0 10*3/uL (ref <=0.00)
Hematocrit: 41.2 % (ref 37.6–49.6)
Hemoglobin: 13.5 g/dL (ref 12.5–17.1)
MCH: 30.3 pg (ref 25.1–33.5)
MCHC: 32.8 g/dL (ref 31.5–35.8)
MCV: 92.4 fL (ref 78.0–96.0)
MPV: 12.3 fL (ref 8.9–12.5)
Platelet Count: 365 10*3/uL — ABNORMAL HIGH (ref 142–346)
RBC: 4.46 10*6/uL (ref 4.20–5.90)
RDW: 14 % (ref 11–15)
WBC: 15.45 10*3/uL — ABNORMAL HIGH (ref 3.10–9.50)
nRBC %: 0 /100{WBCs} (ref <=0.0)

## 2023-09-16 LAB — PT/INR
INR: 1.5 (ref 0.9–1.1)
PT: 17.1 s — ABNORMAL HIGH (ref 10.1–12.9)

## 2023-09-16 LAB — CULTURE AND GRAM STAIN, AEROBIC BACTERIA, WOUND/TISSUE/FLUID
Culture Aerobic Bacteria: NO GROWTH
Gram Stain: NONE SEEN

## 2023-09-16 LAB — WHOLE BLOOD GLUCOSE POCT
Whole Blood Glucose POCT: 179 mg/dL — ABNORMAL HIGH (ref 70–100)
Whole Blood Glucose POCT: 301 mg/dL — ABNORMAL HIGH (ref 70–100)
Whole Blood Glucose POCT: 201 mg/dL — ABNORMAL HIGH (ref 70–100)

## 2023-09-16 LAB — BASIC METABOLIC PANEL
Anion Gap: 10 (ref 5.0–15.0)
BUN: 23 mg/dL (ref 9–28)
CO2: 26 meq/L (ref 17–29)
Calcium: 10.3 mg/dL — ABNORMAL HIGH (ref 7.9–10.2)
Chloride: 103 meq/L (ref 99–111)
Creatinine: 0.9 mg/dL (ref 0.5–1.5)
GFR: >60 mL/min/{1.73_m2} (ref >=60.0)
Glucose: 183 mg/dL — ABNORMAL HIGH (ref 70–100)
Potassium: 3.7 meq/L (ref 3.5–5.3)
Sodium: 139 meq/L (ref 135–145)

## 2023-09-16 LAB — MAGNESIUM: Magnesium: 2.3 mg/dL (ref 1.6–2.6)

## 2023-09-16 MED ORDER — SENNOSIDES-DOCUSATE SODIUM 8.6-50 MG PO TABS
1.0000 | ORAL_TABLET | Freq: Every evening | ORAL | Status: DC
Start: 2023-09-16 — End: 2023-09-20
  Administered 2023-09-16 – 2023-09-19 (×4): 1 via ORAL
  Filled 2023-09-16 (×4): qty 1

## 2023-09-16 MED ORDER — LACTATED RINGERS IV BOLUS
1000.0000 mL | Freq: Once | INTRAVENOUS | Status: AC
Start: 2023-09-16 — End: 2023-09-16
  Administered 2023-09-16: 1000 mL via INTRAVENOUS

## 2023-09-16 MED ORDER — METOPROLOL SUCCINATE ER 50 MG PO TB24
100.0000 mg | ORAL_TABLET | Freq: Two times a day (BID) | ORAL | Status: DC
Start: 2023-09-16 — End: 2023-09-20
  Administered 2023-09-16 – 2023-09-20 (×8): 100 mg via ORAL
  Filled 2023-09-16 (×8): qty 2

## 2023-09-16 MED ORDER — SODIUM CHLORIDE 0.9 % IV MBP
2.0000 g | Freq: Two times a day (BID) | INTRAVENOUS | Status: DC
Start: 2023-09-16 — End: 2023-09-18
  Administered 2023-09-16 – 2023-09-18 (×4): 2 g via INTRAVENOUS
  Filled 2023-09-16 (×3): qty 2

## 2023-09-16 NOTE — Progress Notes (Addendum)
 Brief background: 83 year old male with past medical history significant for COPD, chronic emphysema, CAD, paroxysmal A-fib on warfarin, OSA, GERD, hypertension, hyperlipidemia, diabetes, presented to hospital on 1/5 with dyspnea, found to have hypoxic respiratory failure secondary to left lung pneumonia and left lung effusion     Thoracentesis 1/7 1.2L removed and sample sent. Cytology came back which showed carcinoma   Pulmo and Oncology updated patient's son regarding results.  D- dimer elevated. CTA neg for PE  Hyperglycemia - commenced on Lantus  and sliding scale.  On IV Lasix   Coumadin  stopped,Lovenox  started 1/10  Metformin  on hold due to pending scans    Last 24 Hrs (any critical events):  stable overnight. Remains on O2 at 3L     LDA: G18 Left, G22 RUA     Abnormal labs/VS (needing any orders): WBC 15.45 K 3.7     Neuro changes: A/O x4     Respiratory: 3L oxygen NC                           GI/GU: Continent x2, LBM 1/10     Nutrition: Regular, Consistent Carb     Activity/ Safety/ Mobility: independent      Questions (need for PT/OT, sitter): independent      Tele/ Rhythm: no tele     Skin/ Wounds: scattered bruising, BLE +1 edema      Case management needs:     Procedures: 1/11 CT Abdo pelvis, 1/12 MRI brain w/wo contrast - needs pre medication as claustrophobic    Discharge plan: Home w [facility]     Discharge barriers: TBD

## 2023-09-16 NOTE — Plan of Care (Signed)
 Problem: Moderate/High Fall Risk Score >5  Goal: Patient will remain free of falls  Outcome: Progressing  Flowsheets (Taken 09/16/2023 1016)  Moderate Risk (6-13):   MOD-Utilize diversion activities   MOD-Perform dangle, stand, walk (DSW) prior to mobilization   MOD-Request PT/OT consult order for patients with gait/mobility impairment   MOD-Use gait belt when appropriate     Problem: Compromised Sensory Perception  Goal: Sensory Perception Interventions  Outcome: Progressing  Flowsheets (Taken 09/16/2023 0920)  Sensory Perception Interventions: Offload heels, Pad bony prominences, Reposition q 2hrs/turn Clock, Q2 hour skin assessment under devices if present     Problem: Compromised Activity/Mobility  Goal: Activity/Mobility Interventions  Outcome: Progressing  Flowsheets (Taken 09/16/2023 0920)  Activity/Mobility Interventions: Pad bony prominences, TAP Seated positioning system when OOB, Promote PMP, Reposition q 2 hrs / turn clock, Offload heels     Problem: Inadequate Gas Exchange  Goal: Adequate oxygenation and improved ventilation  Outcome: Progressing  Goal: Patent Airway maintained  Outcome: Progressing  Flowsheets (Taken 09/11/2023 2111 by Hyla Round, RN)  Patent airway maintained:   Position patient for maximum ventilatory efficiency   Provide adequate fluid intake to liquefy secretions   Suction secretions as needed   Reinforce use of ordered respiratory interventions (i.e. CPAP, BiPAP, Incentive Spirometer, Acapella, etc.)   Reposition patient every 2 hours and as needed unless able to self-reposition     Problem: Compromised Moisture  Goal: Moisture level Interventions  Outcome: Progressing  Flowsheets (Taken 09/16/2023 0920)  Moisture level Interventions: Moisture wicking products, Moisture barrier cream     Problem: Pain interferes with ability to perform ADL  Goal: Pain at adequate level as identified by patient  Outcome: Progressing     Problem: Side Effects from Pain Analgesia  Goal: Patient will  experience minimal side effects of analgesic therapy  Outcome: Progressing  Flowsheets (Taken 09/16/2023 1054)  Patient will experience minimal side effects of analgesic therapy: Monitor/assess patient's respiratory status (RR depth, effort, breath sounds)

## 2023-09-16 NOTE — Nursing Progress Note (Addendum)
 Brief background: 83 year old male with past medical history significant for COPD, chronic emphysema, CAD, paroxysmal A-fib on warfarin, OSA, GERD, hypertension, hyperlipidemia, diabetes, presented to hospital on 1/5 with dyspnea, found to have hypoxic respiratory failure secondary to left lung pneumonia and left lung effusion      Thoracentesis 1/7 1.2L removed and sample sent. Cytology came back which showed carcinoma   Pulmo and Oncology updated patient's son regarding results.  D- dimer elevated. CTA neg for PE  Hyperglycemia - commenced on Lantus  and sliding scale.  On IV Lasix   Coumadin  stopped,Lovenox  started 1/10  Metformin  on hold due to pending scans     Last 24 Hrs (any critical events):  Pt BP was 90/56, informed to attending physician, IVF ringers  lactate ordered and given , with good effect. No sign of chest pain, lightheaded.   LDA: G18 Left     Abnormal labs/VS (needing any orders): WBC 15.45 K 3.7      Neuro changes: A/O x4     Respiratory: 3L oxygen NC                           GI/GU: Continent x2, LBM 1/10     Nutrition: Regular, Consistent Carb     Activity/ Safety/ Mobility: independent      Questions (need for PT/OT, sitter): independent      Tele/ Rhythm: no tele     Skin/ Wounds: scattered bruising, BLE +1 edema      Case management needs:      Procedures: 1/11 CT Abdo pelvis, 1/12 MRI brain w/wo contrast - needs pre medication as claustrophobic( informed to Dr. Zafar).      Discharge plan: Home w [facility]      Discharge barriers: TBD

## 2023-09-16 NOTE — Consults (Signed)
 Interventional Pulmonology   Consult Note    Date of Service: 09/16/2023  Consult Question: Metastatic adenocarcinoma of unclear primary    ID: Carl [name] is a 83 y.o. male patient with h/o smoking, COPD, coronary artery disease, A-fib admitted with shortness of breath    HPI/Interval History:   This is an 83 year old male with past medical history of COPD, emphysema, CAD, A-fib on Coumadin , OSA who is hospitalized in the setting of dyspnea on exertion and acute hypoxic respiratory failure.  Initially presumed to be pneumonia however imaging revealed left-sided pleural effusion and pleural-based nodules/hilar mass.  Patient underwent thoracentesis which was exudative and cytology was positive for adenocarcinoma however staining unable to determine primary source of cancer.  Interventional pulmonology consulted for obtaining additional tissue to establish diagnosis.  Patient was accompanied by his son during bedside visit.  He endorses smoking 3 packs/day from the age of 74 up until when he quit in 1989.  He denies any personal history of cancer.  He states his last colonoscopy was 12 years ago.  He does have family history of cancer and his siblings who are also smokers.  Currently he feels like his breathing is fair and is greatly improved as compared to when he first came to the hospital.      ROS: A complete 14-point review of systems is negative except as noted in the HPI.     Past Medical History:   Diagnosis Date    Diabetes mellitus     Hyperlipidemia     Hypertension      Past Surgical History[1]  Family History[2]  Social History     Occupational History    Not on file   Tobacco Use    Smoking status: Former     Types: Cigarettes    Smokeless tobacco: Not on file    Tobacco comments:     Quit smoking June 1989, smoked 30 years before   Vaping Use    Vaping status: Never Used   Substance and Sexual Activity    Alcohol use: Not Currently    Drug use: Not Currently    Sexual activity: Not  Currently      Allergies[3]      Physical Exam:  BP 104/73   Pulse (!) 122   Temp 97.5 F (36.4 C) (Oral)   Resp 16   Ht 1.6 m (5' 3)   Wt 82.1 kg (180 lb 14.4 oz)   SpO2 97%   BMI 32.04 kg/m     General Appearance: No acute distress, speaking in full sentences, well nourished  ENT: oropharynx appears normal  Neck: soft  Lungs: Unlabored breathing on 3 L nasal cannula.  Cardiac: RRR  Abdomen: normal bowel sounds all quadrants, soft, non distended  Neuro: alert and oriented x 3, no obvious focal motor deficit  Psychiatric: Normal affect and mood  Skin: warm, dry  Extremities:  No edema/clubbing    Labs/Imaging/PFTs:  Relevant CT chest, CXR, and PET/CT findings:        IMPRESSION:         1. Negative for pulmonary embolism.  2. Extensive left pleural nodularity, pericardial thickening and an  enlarged left anterior cardiophrenic lymph node compatible with metastatic  disease.  3. Persistent left pleural effusion, slightly decreased. Persistent almost  complete left lower lobe atelectasis.  4. Indeterminate left adrenal nodule.      Impression:  Carl [name] is a 83 y.o. male with pmhx of extensive smoking, coronary artery  disease, A-fib on therapeutic anticoagulation, recent diagnosis of metastatic adenocarcinoma of unclear primary for whom interventional pulmonology is consulted for obtaining additional tissue to confirm diagnostic origin as well as management of recurrent malignant pleural effusion.    Metastatic adenocarcinoma of unclear primary  Recurrent malignant pleural effusion  History of CAD  History of A-fib on anticoagulation  COPD    He does have extensive smoking history raising possibility of lung cancer as the likely primary source of his cancer however CT scan of the abdomen and pelvis is pending and his last colonoscopy was 12 years ago.  Upon review of his CT scan he does have a moderate left-sided pleural effusion as well as left lower lobe atelectasis and pericardiac  pleural-based mass measuring 7.4 x 4.6 cm.  Upon review of his CT scan from June 2024 this was not present previously and hence has arise in the last 6 months.    Recommendations:  -Will plan for possible medical pleuroscopy versus EBUS TBNA + chest tube/IPC placement early next week based on logistics and availability.  -Will discuss with primary team the timing of holding anticoagulation as he is on therapeutic Lovenox .  -Will also await findings of CT of the abdomen and pelvis and MRI brain in the interim.  -Rest of the care per primary team and general pulmonology.      Jinnie Fairly, MD  Interventional Pulmonology  [facility]  [facility]  [address]  [place], [place] [address]   T [phone]  F [phone]   [email or web address]               [1]   Past Surgical History:  Procedure Laterality Date    CARDIAC SURGERY  2004    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    SKIN BIOPSY      TONSILLECTOMY     [2]   Family History  Problem Relation Name Age of Onset    Lung cancer Brother     [3]   Allergies  Allergen Reactions    Cephalexin Rash

## 2023-09-16 NOTE — Progress Notes (Addendum)
 MEDICINE PROGRESS NOTE    Date Time: 09/16/23 12:34 PM  Patient Name: Carl [name]  Attending Physician: Ahmed Thedford SAUNDERS, MD    Assessment / Plan:   83 year old male with past medical history significant for COPD, chronic emphysema, CAD, paroxysmal A-fib on warfarin, OSA, GERD, hypertension, hyperlipidemia, diabetes, presented to hospital on 1/5 with dyspnea, found to have hypoxic respiratory failure secondary to left lung pneumonia and left lung effusion    #Acute hypoxic respiratory failure, currently on 3 L of oxygen via nasal cannula.  Repeat chest x-ray showed effusion but bedside ultrasound does not support this.  CTA chest Negative for pulmonary embolism however shows extensive left pleural nodularity pericardial thickening and enlarged left anterior cardiophrenic lymph node compatible with metastatic disease, persistent left pleural effusion slightly decreased and complete left lower lobe atelectasis and intermediate left adrenal nodule.  Echocardiogram shows concentric left ventricular remodeling, LVEF 73%.    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  Consulted oncology, ordered CT abdomen pelvis with and without contrast for 1/11 (getting IV contrast for CTA today) and MRI brain for 1/12. Consult interventional pulmonology for pleural biopsy and Pleurx cath for recurrent pleural effusion.  Follow-up their recommendations.  General pulmonology recommendations appreciated.    #Left pleural effusion, underwent thoracentesis by [facility] pulmonology on 1/7, 1.3 L was drained, repeat chest x-ray showed improved but persistent effusion.  Repeat ultrasound reported worsening effusion but bedside ultrasound does not support this.     #Left lower lobe pneumonia- improving, on cefepime , WBC 15k down trending    #Mediastinal lymphadenopathy, following up with cytology of pleural effusion to rule out  malignancy    #COPD, not in exacerbation, remains on Breo Ellipta , DuoNebs every 6 hours, [place] pulmonology is following as well.  Received dupixent  300 mg on 1/7     #Paroxysmal A-fib, on warfarin as outpatient. Warfarin on Hold and currently on full dose lovenox  1mg /kg BID. Will hold lovenox  for anticipated Interventional pulmonology procedure for pleural biopsy/Pleurx cath.  Right now we do not have an anticipated date for the procedure.  Will continue with Lovenox  full dose twice daily.  Heart rate in 110s.  Increased levels of metoprolol  from 100 daily to 100 twice daily.    #Diabetes, A1c 7.4, on glipizide  and metformin  at home--Continue with Lantus  and Humalog  sliding scale.    #HTN, CAD on aspirin, metoprolol , atorvastatin .Held home med Imdur  30, lisinopril , amlodipine  given borderline soft blood pressure.    #HFpEF, likely in mild exacerbation, BNP elevated to 1300.  Repeating echo reviewed. Monitor off Lasix .     For DVT prophylaxis, on Lovenox   He is full code  Discussed with pulmonology        Safety Checklist:     DVT prophylaxis:  CHEST guideline (See page [id number]) Chemical   Foley:  [place] R[facility] Foley protocol Not present   IVs:  Peripheral IV   PT/OT: Ordered   Daily CBC & or Chem ordered:  SHM/ABIM guidelines (see #5) No   Reference for approximate charges of common labs: CBC auto diff - $76  BMP - $99  Mg - $79    Lines:     Patient Lines/Drains/Airways Status       Active PICC Line / CVC Line / PIV Line / Drain / Airway / Intraosseous Line / Epidural Line / ART Line / Line / Wound / Pressure Ulcer / NG/OG Tube       Name  Placement date Placement time Site Days    Peripheral IV 09/10/23 22 G Diffusion Left Antecubital 09/10/23  1954  Antecubital  1                     Disposition: (Please see PAF column for Expected D/C Date)   Today's date: 09/16/2023  Admit Date: 09/10/2023  7:27 PM  LOS: 6  Clinical Milestones: copd, effusion   Anticipated discharge needs: tbd      Subjective     CC:  Hypoxia    HPI/Subjective: 83 year old male with past medical history significant for COPD, chronic emphysema, CAD, paroxysmal A-fib on warfarin, OSA, GERD, hypertension, hyperlipidemia, diabetes, presented to hospital on 1/5 with dyspnea  At presentation blood pressure was 130/74 heart rate 116 temperature 98.7 saturating 87% on room air  Blood work showed WBC of 15, hemoglobin of 15.7 platelet count 367  BMP was grossly unremarkable except blood glucose of 220, lactic acid of 3.2, BNP 1000 INR 2.9  Chest x-ray showed moderate left pleural effusion  Admitted to hospitalist service with acute hypoxic respiratory failure secondary to left-sided pneumonia and effusion, started on cefepime  and azithromycin   [facility] pulmonology was consulted for possible thoracentesis  His warfarin was held, he was started on enoxaparin   Pulmonology recommended thoracentesis when INR is less than 1.5  Underwent thoracentesis on 1/7, 1.3 L was removed  Repeat chest x-ray showed improved but persistent effusion  1/9, on 0.5 L O2 via NC. BG up to 300s. Repeat chest XR is slightly better but still has effusion. Cytology pending  1/10, on 3 L, repeat chest x-ray shows persistent effusion.  Discussed with pulmonology, bedside ultrasound does not show significant effusion.  Getting D-dimer, if elevated will have CTA chest to rule out PE.  Switch furosemide  to IV.  Ordered echocardiogram and BNP.  Started on Lantus  10 units    On 1/10 patient was seen at 9:45 AM with nurse Elyn  No acute events noted overnight, he was started on oxygen again  This morning patient seems to be stable, denies chest pain, dyspnea, abdominal pain    1/11:Increase the dose of metoprolol  from 100 daily to 100 twice daily since the heart rate is around 110s.  Held other home antihypertensive medications since blood pressure is borderline soft.  Consulted interventional pulmonology for Pleurx cath and pleural biopsy. Held IV Lasix .      Review of Systems:     As per  HPI    Physical Exam:     VITAL SIGNS PHYSICAL EXAM   Temp:  [97.5 F (36.4 C)-98.4 F (36.9 C)] 97.9 F (36.6 C)  Heart Rate:  [101-128] 120  Resp Rate:  [16-19] 16  BP: (93-127)/(57-78) 93/57  Blood Glucose:    Telemetry:       Intake/Output Summary (Last 24 hours) at 09/16/2023 1234  Last data filed at 09/15/2023 2200  Gross per 24 hour   Intake 150 ml   Output --   Net 150 ml      Physical Exam  General: Well developed, well nourished, obese elderly male, in no apparent distress  Head and Neck: Atraumatic, normocephalic, PERRL, extraocular movements intact, no scleral icterus, wearing nasal cannula  Cardiovascular: Irregularly irregular rate and rhythm, no murmurs  Lungs: Decreased breath sounds at the bases bilaterally, no wheezing  Abdomen: Soft, non-tender, non-distended  Extremities: No edema         Meds:     Medications were reviewed:  Current Facility-Administered  Medications   Medication Dose Route Frequency    albuterol -ipratropium  3 mL Nebulization Q6H WA    allopurinol   300 mg Oral Daily    atorvastatin   40 mg Oral Daily    cefepime   2 g Intravenous Q8H    enoxaparin   1 mg/kg Subcutaneous Q12H    fluticasone  furoate-vilanterol  1 puff Inhalation QAM    insulin  glargine  10 Units Subcutaneous QAM    insulin  lispro  1-5 Units Subcutaneous TID AC    lactated ringers   1,000 mL Intravenous Once    metoprolol  succinate  100 mg Oral Q12H     Infusion Meds[1]  PRN Medications[2]      Labs:     Labs (last 72 hours):    Recent Labs   Lab 09/16/23  0317 09/15/23  0348   WBC 15.45* 16.14*   Hemoglobin 13.5 14.4   Hematocrit 41.2 43.3   Platelet Count 365* 367*       Recent Labs   Lab 09/16/23  0317 09/15/23  1721   PT 17.1* 16.1*   INR 1.5 1.4   PTT  --  33    Recent Labs   Lab 09/16/23  0317 09/15/23  1721 09/12/23  0354 09/11/23  0617 09/10/23  1946   Sodium 139 138  More results in Results Review 141 140   Potassium 3.7 4.1  More results in Results Review 4.1 3.9   Chloride 103 99  More results in Results  Review 103 101   CO2 26 28  More results in Results Review 23 26   BUN 23 23  More results in Results Review 28 29*   Creatinine 0.9 0.9  More results in Results Review 0.9 0.9   Calcium  10.3* 10.3*  More results in Results Review 9.5 10.5*   Albumin   --   --   --  2.4* 3.0*   Protein, Total  --   --   --  6.5 8.2   Bilirubin, Total  --   --   --  0.6 0.8   Alkaline Phosphatase  --   --   --  141* 192*   ALT  --   --   --  17 25   AST (SGOT)  --   --   --  43* 36   Glucose 183* 250*  More results in Results Review 188* 220*   More results in Results Review = values in this interval not displayed.                   Microbiology, reviewed and are significant for:  Microbiology Results (last 15 days)       Procedure Component Value Units Date/Time    Culture and Gram Stain, Aerobic and Anaerobic Bacteria, Wound/Tissue/Fluid (Order) [[id number]] Collected: 09/12/23 1131    Order Status: Sent Specimen: Pleural Fluid from Pleural Cavity, Left Updated: 09/12/23 1147    Narrative:      The following orders were created for panel order Culture and Gram Stain, Aerobic and Anaerobic Bacteria, Wound/Tissue/Fluid (Order).  Procedure                               Abnormality         Status                     ---------                               -----------         ------  Culture And Gram Stain,.SABRASABRA[[id number]]                      Preliminary result           Please view results for these tests on the individual orders.    Culture And Gram Stain, Aerobic Bacteria, Wound/Tissue/Fluid [[id number]] Collected: 09/12/23 1131    Order Status: Completed Specimen: Pleural Fluid from Pleural Cavity, Left Updated: 09/15/23 1950     Culture Aerobic Bacteria No growth to date.  Final report to follow.     Gram Stain Many WBCs      No organisms seen    Narrative:      Stain performed on cytospin (concentrated) specimen.     Urine Streptococcus pneumoniae Antigen [[id number]]  (Normal) Collected: 09/11/23 0602    Order  Status: Completed Specimen: Urine, Clean Catch Updated: 09/11/23 2055     Urine Streptococcus pneumoniae Antigen Negative     Comment: This is a presumptive test for the direct qualitative detection of bacterial antigen. This test is not intended as a substitute for a gram stain and bacterial culture. Samples with extremely low levels of antigen may yield negative results.       Urine Legionella pneumophila serogroup 1 Antigen [[id number]]  (Normal) Collected: 09/11/23 0602    Order Status: Completed Specimen: Urine, Clean Catch Updated: 09/11/23 2053     Urine Legionella pneumophila serogroup 1 Antigen Negative     Comment: This test will not detect infections caused by other Legionella pneumophila serogroups or by other Legionella species. A negative antigen result does not exclude infection with Legionella pneumophila serogroup 1.       Nares, MRSA (methicillin-resistant Staphylococcus aureus) Screening, PCR [[id number]]  (Normal) Collected: 09/11/23 0143    Order Status: Completed Specimen: Swab from Nares Updated: 09/11/23 0426     MRSA (methicillin resistant Staphylococcus aureus) DNA Not Detected     Comment: Testing performed with the Xpert MRSA NxG assay. This test is intended for the detection of methicillin-resistant Staphylococcus aureus (MRSA) DNA. This test is not intended to diagnose, guide, or monitor treatment for MRSA infections, or provide results of susceptibility to methicillin. A positive test result does not necessarily indicate the presence of viable organisms. A negative result does not preclude MRSA nasal colonization.       Culture, Sputum and Lower Respiratory [[id number]]     Order Status: Sent Specimen: Sputum, Expectorated     COVID-19 and Influenza ([place]) (symptomatic) [[id number]]  (Normal) Collected: 09/10/23 1946    Order Status: Completed Specimen: Swab from Anterior Nares Updated: 09/10/23 2019     SARS-CoV-2 (COVID-19) RNA Not Detected     Influenza A RNA Not Detected      Influenza B RNA Not Detected    Narrative:      A result of Detected indicates POSITIVE for the presence of viral RNA  A result of Not Detected indicates NEGATIVE for the presence of viral RNA    Test performed using the Roche cobas [place] SARS-CoV-2 & Influenza A/B assay. This is a multiplex real-time RT-PCR assay for the detection of SARS-CoV-2, influenza A, and influenza B virus RNA. Viral nucleic acids may persist in vivo, independent of viability. Detection of viral nucleic acid does not imply the presence of infectious virus, or that virus nucleic acid is the cause of clinical symptoms. Negative results do not preclude SARS-CoV-2, influenza A, and/or influenza B infection and should not be used as the sole basis for diagnosis,  treatment or other patient management decisions. Invalid results may be due to inhibiting substances in the specimen and recollection should occur.     Culture, Blood, Aerobic And Anaerobic [[id number]] Collected: 09/10/23 1946    Order Status: Completed Specimen: Blood, Venous Updated: 09/15/23 2300     Culture Blood No growth at 5 days            Imaging, reviewed and are significant for:  CTA chest      Signed by: Thedford JONELLE Olmsted, MD        This note was generated within the [employer]MR using Dragon medical speech recognition software and may contain inherent errors or omissions not intended by the user. Grammatical and punctuation errors, random word insertions, deletions, pronoun errors and incomplete sentences are occasional consequences of this technology due to software limitations. Not all errors are caught or corrected. Although every attempt is made to root out erroneus and incomplete transcription, the note may still not fully represent the intent or opinion of the author. If there are questions or concerns about the content of this note or information contained within the body of this dictation they should be addressed directly with the author for clarification. '         [1]    Current Facility-Administered Medications   Medication Dose Route Frequency Last Rate   [2]   Current Facility-Administered Medications   Medication Dose Route    acetaminophen   650 mg Oral    benzocaine -menthol   1 lozenge Buccal    benzonatate   100 mg Oral    calcium  carbonate  500 mg Oral    carboxymethylcellulose sodium  1 drop Both Eyes    dextrose   15 g of glucose Oral    Or    dextrose   12.5 g Intravenous    Or    dextrose   12.5 g Intravenous    Or    glucagon  (rDNA)  1 mg Intramuscular    dextrose   15 g of glucose Oral    Or    dextrose   12.5 g Intravenous    Or    dextrose   12.5 g Intravenous    Or    glucagon  (rDNA)  1 mg Intramuscular    magnesium  sulfate  1 g Intravenous    melatonin  3 mg Oral    naloxone   0.2 mg Intravenous    potassium & sodium phosphates   2 packet Oral    potassium chloride   0-60 mEq Oral    Or    potassium chloride   0-60 mEq Oral    Or    potassium chloride   10 mEq Intravenous    saline  2 spray Each Nare

## 2023-09-16 NOTE — Consults (Signed)
 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  Lab data notable for leukocytosis and hyperglycemia.  Reviewed CT chest which shows pleural and pulmonary nodules with left-sided pleural effusion and a cardiophrenic mass.  Pleural fluid cytology positive for adenocarcinoma, immunohistochemistry unable to clearly identify primary tumor source.    Discussed findings with patient and his son, explained that he has a metastatic adenocarcinoma which is a malignant tumor and likely incurable.  But in order to establish a treatment plan would recommend completing staging workup including CT abdomen and pelvis and MRI brain.  Discussed with Dr. Hendricks, pulmonology, patient may require Pleurx catheter due to rapid reaccumulation of pleural effusion.  Recommend consulting interventional pulmonology for possible Pleurx catheter placement.    May require additional tumor tissue for molecular analysis and also to establish primary source.  Will decide on the site of biopsy after reviewing the CT abdomen pelvis.  Patient and his son are in agreement with the plan.  They expressed understanding.  Also plan discussed with the primary hospitalist Dr. Boneta.  Oncology will continue to follow.    History:   Carl [name] is a 83 y.o. male who presented to the emergency room with shortness of breath, noted to have  acute respiratory failure.  Further imaging identified a left-sided pleural effusion with pleural nodules and pulmonary nodules.  Underwent thoracentesis and pleural fluid cytology was consistent with adenocarcinoma.  [facility] noted that it was positive for CEA but negative for TTF-1 and MOC 1.  Patient used to live in [place]  and is recently moved to [place]  to be with his son.  Per patient's son the patient is a former tobacco smoker with history of COPD and CHF.  Patient also notes that he has coronary artery disease and had undergone a quad bypass several years ago.    During the visit today he is resting in bed.  Still feels quite fatigued and tired.  But overall his breathing is significantly improved compared to his initial presentation.  Notes cough at initial presentation which is significantly improved.  Denies any headaches, vision changes, neck stiffness, focal weakness or any seizure type events.  His appetite is somewhat diminished but denies any significant weight loss at this time.  No other new complaints except as noted in the review of systems.    Past Medical History:     Past Medical History:   Diagnosis Date    Diabetes mellitus     Hyperlipidemia     Hypertension        Past Surgical History:   Past Surgical History[1]    Family History:   Family History[2]    Social History:     Social History     Socioeconomic History    Marital status: Widowed     Spouse name: Not on file    Number of children: Not on  file    Years of education: Not on file    Highest education level: Not on file   Occupational History    Not on file   Tobacco Use    Smoking status: Former     Types: Cigarettes    Smokeless tobacco: Not on file    Tobacco comments:     Quit smoking June 1989, smoked 30 years before   Vaping Use    Vaping status: Never Used   Substance and Sexual Activity    Alcohol use: Not Currently    Drug use: Not Currently    Sexual activity: Not Currently   Other Topics Concern    Not on file    Social History Narrative    Not on file     Social Drivers of Health     Financial Resource Strain: Low Risk  (09/11/2023)    Overall Financial Resource Strain (CARDIA)     Difficulty of Paying Living Expenses: Not hard at all   Food Insecurity: No Food Insecurity (09/10/2023)    Hunger Vital Sign     Worried About Running Out of Food in the Last Year: Never true     Ran Out of Food in the Last Year: Never true   Transportation Needs: No Transportation Needs (09/11/2023)    PRAPARE - Therapist, Art (Medical): No     Lack of Transportation (Non-Medical): No   Physical Activity: Insufficiently Active (05/11/2023)    Exercise Vital Sign     Days of Exercise per Week: 6 days     Minutes of Exercise per Session: 20 min   Stress: No Stress Concern Present (03/11/2022)    Received from [facility], [facility] of Occupational Health - Occupational Stress Questionnaire     Feeling of Stress : Not at all   Social Connections: Unknown (03/13/2023)    Received from [facility]    Social Network     Social Network: Not on file   Intimate Partner Violence: Not At Risk (09/10/2023)    Humiliation, Afraid, Rape, and Kick questionnaire     Fear of Current or Ex-Partner: No     Emotionally Abused: No     Physically Abused: No     Sexually Abused: No   Housing Stability: Low Risk  (09/11/2023)    Housing Stability Vital Sign     Unable to Pay for Housing in the Last Year: No     Number of Times Moved in the Last Year: 0     Homeless in the Last Year: No       Allergies:   Allergies[3]    Medications:     Current Facility-Administered Medications   Medication Dose Route Frequency    albuterol -ipratropium  3 mL Nebulization Q6H WA    allopurinol   300 mg Oral Daily    amLODIPine   5 mg Oral Daily    atorvastatin   40 mg Oral Daily    cefepime   2 g Intravenous Q8H    enoxaparin   1 mg/kg Subcutaneous Q12H    fluticasone  furoate-vilanterol  1 puff Inhalation QAM    furosemide   40 mg Intravenous Daily     hydroCHLOROthiazide   25 mg Oral Daily    insulin  glargine  10 Units Subcutaneous QAM    insulin  lispro  1-5 Units Subcutaneous TID AC    isosorbide  mononitrate  30 mg Oral Daily    lisinopril   40 mg Oral  Daily    metoprolol  succinate  100 mg Oral Daily       Review of Systems:   Review of Systems   Constitutional:  Positive for malaise/fatigue.   HENT: Negative.     Eyes: Negative.    Respiratory:  Positive for cough and shortness of breath.    Cardiovascular: Negative.    Gastrointestinal: Negative.    Genitourinary: Negative.    Musculoskeletal: Negative.    Skin: Negative.    Neurological:  Positive for weakness.   Psychiatric/Behavioral: Negative.          Physical Exam:     Vitals:    09/15/23 1831 09/15/23 2000 09/16/23 0002 09/16/23 0312   BP: 127/78  112/64 117/78   Pulse: (!) 128  (!) 101 (!) 105   Resp: 17  19 17    Temp: 97.5 F (36.4 C)  97.5 F (36.4 C) 98.1 F (36.7 C)   TempSrc: Oral  Oral Oral   SpO2: 97%  95% 98%   Weight:  82.1 kg (180 lb 14.4 oz)     Height:            Intake and Output Summary (Last 24 hours) at Date Time    Intake/Output Summary (Last 24 hours) at 09/16/2023 0723  Last data filed at 09/15/2023 2200  Gross per 24 hour   Intake 150 ml   Output --   Net 150 ml     Physical Exam  Vitals reviewed.   Constitutional:       General: He is not in acute distress.     Appearance: He is obese. He is not ill-appearing.   HENT:      Head: Normocephalic and atraumatic.      Nose: Nose normal. No congestion or rhinorrhea.   Eyes:      General: No scleral icterus.        Right eye: No discharge.         Left eye: No discharge.      Extraocular Movements: Extraocular movements intact.      Conjunctiva/sclera: Conjunctivae normal.   Cardiovascular:      Rate and Rhythm: Regular rhythm. Tachycardia present.      Heart sounds: No murmur heard.     No gallop.   Pulmonary:      Effort: Pulmonary effort is normal. No respiratory distress.      Breath sounds: Examination of the left-middle field  reveals decreased breath sounds. Examination of the left-lower field reveals decreased breath sounds. Decreased breath sounds present. No wheezing or rales.   Abdominal:      General: Bowel sounds are normal. There is no distension.      Palpations: Abdomen is soft.      Tenderness: There is no abdominal tenderness. There is no guarding.   Musculoskeletal:         General: Normal range of motion.      Cervical back: Normal range of motion. No rigidity.      Right lower leg: Edema present.      Left lower leg: Edema present.   Lymphadenopathy:      Cervical: No cervical adenopathy.   Skin:     General: Skin is warm.      Coloration: Skin is not jaundiced.   Neurological:      General: No focal deficit present.      Mental Status: He is alert and oriented to person, place, and time.      Cranial Nerves:  No cranial nerve deficit.   Psychiatric:         Mood and Affect: Mood normal.         Behavior: Behavior normal.        Labs Reviewed:     Results       Procedure Component Value Units Date/Time    Basic Metabolic Panel [[id number]]  (Abnormal) Collected: 09/16/23 0317    Specimen: Blood, Venous Updated: 09/16/23 0444     Glucose 183 mg/dL      BUN 23 mg/dL      Creatinine 0.9 mg/dL      Calcium  10.3 mg/dL      Sodium 860 mEq/L      Potassium 3.7 mEq/L      Chloride 103 mEq/L      CO2 26 mEq/L      Anion Gap 10.0     GFR >60.0 mL/min/1.73 m2     Magnesium  [[id number]]  (Normal) Collected: 09/16/23 0317    Specimen: Blood, Venous Updated: 09/16/23 0444     Magnesium  2.3 mg/dL     PT/INR [[id number]]  (Abnormal) Collected: 09/16/23 0317    Specimen: Blood, Venous Updated: 09/16/23 0434     PT 17.1 sec      INR 1.5    CBC without Differential [[id number]]  (Abnormal) Collected: 09/16/23 0317    Specimen: Blood, Venous Updated: 09/16/23 0428     WBC 15.45 x10 3/uL      Hemoglobin 13.5 g/dL      Hematocrit 58.7 %      Platelet Count 365 x10 3/uL      MPV 12.3 fL      RBC 4.46 x10 6/uL      MCV 92.4 fL      MCH 30.3 pg       MCHC 32.8 g/dL      RDW 14 %      nRBC % 0.0 /100 WBC      Absolute nRBC 0.00 x10 3/uL     Culture, Blood, Aerobic And Anaerobic [[id number]] Collected: 09/10/23 1946    Specimen: Blood, Venous Updated: 09/15/23 2300     Culture Blood No growth at 5 days    Culture And Gram Stain, Aerobic Bacteria, Wound/Tissue/Fluid [[id number]] Collected: 09/12/23 1131    Specimen: Pleural Fluid from Pleural Cavity, Left Updated: 09/15/23 1950     Culture Aerobic Bacteria No growth to date.  Final report to follow.     Gram Stain Many WBCs      No organisms seen    Narrative:      Stain performed on cytospin (concentrated) specimen.     Basic Metabolic Panel [[id number]]  (Abnormal) Collected: 09/15/23 1721    Specimen: Blood, Venous Updated: 09/15/23 1818     Glucose 250 mg/dL      BUN 23 mg/dL      Creatinine 0.9 mg/dL      Calcium  10.3 mg/dL      Sodium 861 mEq/L      Potassium 4.1 mEq/L      Chloride 99 mEq/L      CO2 28 mEq/L      Anion Gap 11.0     GFR >60.0 mL/min/1.73 m2     APTT [[id number]]  (Normal) Collected: 09/15/23 1721    Specimen: Blood, Venous Updated: 09/15/23 1812     PTT 33 sec     PT/INR [[id number]]  (Abnormal) Collected: 09/15/23 1721    Specimen: Blood, Venous Updated: 09/15/23 1812  PT 16.1 sec      INR 1.4    Whole Blood Glucose POCT [[id number]]  (Abnormal) Collected: 09/15/23 1604    Specimen: Blood, Capillary Updated: 09/15/23 1621     Whole Blood Glucose POCT 231 mg/dL     D-Dimer [[id number]]  (Abnormal) Collected: 09/15/23 1303    Specimen: Blood, Venous Updated: 09/15/23 1332     D-Dimer 1.54 ug/mL FEU     PT/INR [[id number]]  (Abnormal) Collected: 09/15/23 1303    Specimen: Blood, Venous Updated: 09/15/23 1332     PT 16.2 sec      INR 1.4    NT-ProBNP [[id number]]  (Abnormal) Collected: 09/15/23 1125    Specimen: Blood, Venous Updated: 09/15/23 1235     NT-ProBNP 1,326 pg/mL     Whole Blood Glucose POCT [[id number]]  (Abnormal) Collected: 09/15/23 1144    Specimen: Blood, Capillary Updated:  09/15/23 1152     Whole Blood Glucose POCT 252 mg/dL     Whole Blood Glucose POCT [[id number]]  (Abnormal) Collected: 09/15/23 0829    Specimen: Blood, Capillary Updated: 09/15/23 0834     Whole Blood Glucose POCT 240 mg/dL     Basic Metabolic Panel [[id number]]  (Abnormal) Collected: 09/15/23 0348    Specimen: Blood, Venous Updated: 09/15/23 0723     Glucose 241 mg/dL      BUN 19 mg/dL      Creatinine 0.8 mg/dL      Calcium  10.5 mg/dL      Sodium 862 mEq/L      Potassium 4.4 mEq/L      Chloride 100 mEq/L      CO2 25 mEq/L      Anion Gap 12.0     GFR >60.0 mL/min/1.73 m2               Rads:   CT Angiogram Chest    Result Date: 09/15/2023  1. Negative for pulmonary embolism. 2. Extensive left pleural nodularity, pericardial thickening and an enlarged left anterior cardiophrenic lymph node compatible with metastatic disease. 3. Persistent left pleural effusion, slightly decreased. Persistent almost complete left lower lobe atelectasis. 4. Indeterminate left adrenal nodule. Theopolis Rowan, MD 09/15/2023 6:27 PM    XR Chest AP Portable    Result Date: 09/15/2023  Interval progression of a left-sided pleural effusion with associated atelectasis and/or pneumonia. Lindie Cunning, MD 09/15/2023 5:51 AM    XR Chest AP Portable    Result Date: 09/14/2023  Unchanged small to moderate left pleural effusion. Theopolis Rowan, MD 09/14/2023 4:21 PM    XR Chest AP Portable    Result Date: 09/12/2023  1.Slight improvement of moderate to large left pleural effusion/atelectasis. Superimposed pneumonia is less likely, but cannot be excluded. 2.Stable retrocardiac atelectasis. Lillette CHRISTELLA Gillis, MD 09/12/2023 12:42 PM    CT Angio Chest (PE or trauma protocol)    Result Date: 09/11/2023  1.No acute pulmonary emboli. 2.Increased moderate to large left pleural effusion with associated compressive atelectasis of the left lower lobe and lingula. 3.Multiple enlarging pulmonary nodules and enlarging left anterior cardiophrenic mass/nodal mass, highly suspicious  for metastases. Garnette Rouse, MD 09/11/2023 1:08 AM    XR Chest  AP Portable    Result Date: 09/10/2023  Moderate left pleural effusion, increased compared to December 18. Hildegard Bayard, MD 09/10/2023 8:04 PM       Signed by: Jocelyne Robin, MD        [facility]  [address]   [place] [address]  [phone]         [  1]   Past Surgical History:  Procedure Laterality Date    CARDIAC SURGERY  2004    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    SKIN BIOPSY      TONSILLECTOMY     [2]   Family History  Problem Relation Name Age of Onset    Lung cancer Brother     [3]   Allergies  Allergen Reactions    Cephalexin Rash

## 2023-09-16 NOTE — Progress Notes (Signed)
 PULMONARY INPATIENT PROGRESS NOTE  [phone] (MD Spectralink #1) / [phone] (MD Spectralink #2) / [phone] (PA Spectralink)        Date Time: 09/16/23 10:11 AM  Patient Name: Carl [name]      Assessment:     Left effusion-metastatic adenocarcinoma, primary unclear              -has pleural disease on CT   -would consult IP for pleurex and ? Pleural biopsy, otherwise will have to ask radiology if they can biopsy one of the nodules as oncology needs more tissue  2. COPD-continue nebs/Breo  3. Paroxysmal atrial fib-on coumadin  for 20 years-now on lovenox  in anticipation of further procedures  4. CAD-s/p CABG in 2005  5. OSA-not on CPAP currently  6. HTN/Hyperlipidemia/DM  7. Family history of HIT, son, not pt    Plan:   Day 6 cefepime , ? Stop tomorrow  Continue nebs  O2 as needed  As above, IP eval for pleurex and ? Pleural biopsy  Lovenox  for anticoagulation    Leeroy JAYSON Citizen, MD  09/16/23  10:11 AM      Interval History     Interval History/24 hr. Events:  Was on RA yesterday, more short of breath overnight    Review of Systems:   Reviewed and no changes      Physical Exam:     VITAL SIGNS PHYSICAL EXAM   Vitals:    09/16/23 0805   BP: 111/67   Pulse: (!) 107   Resp: 17   Temp: 98.2 F (36.8 C)   SpO2: 97%     Temp (24hrs), Avg:98 F (36.7 C), Min:97.5 F (36.4 C), Max:98.4 F (36.9 C)                SpO2: 97 % (09/16/2023  8:05 AM)  O2 Device: Nasal cannula (09/16/2023  8:05 AM)  O2 Flow Rate (L/min): 3 L/min (09/16/2023  8:05 AM)     Physical Exam  General: awake, alert, breathing comfortably, no acute distress  Cardiovascular: RRR  Neck: No JVD  Lungs: Diminished over left lower lung base, dull 1/2 up  Abdomen: soft, non-tender, non-distended; no palpable masses,  normoactive bowel sounds  Extremities: Trace edema BLE  Pulse: equal pulses,  Neurological: Alert and oriented X3, mood and affect normal         Meds:     Current Facility-Administered Medications   Medication Dose Route Frequency     albuterol -ipratropium  3 mL Nebulization Q6H WA    allopurinol   300 mg Oral Daily    atorvastatin   40 mg Oral Daily    cefepime   2 g Intravenous Q8H    enoxaparin   1 mg/kg Subcutaneous Q12H    fluticasone  furoate-vilanterol  1 puff Inhalation QAM    furosemide   40 mg Intravenous Daily    insulin  glargine  10 Units Subcutaneous QAM    insulin  lispro  1-5 Units Subcutaneous TID AC    isosorbide  mononitrate  30 mg Oral Daily    metoprolol  succinate  100 mg Oral Daily       Labs:     Recent Labs   Lab 09/16/23  0317 09/15/23  1721 09/15/23  0348 09/13/23  0449 09/12/23  0354 09/11/23  1405   WBC 15.45*  --  16.14* 14.36*  --  17.55*   RBC 4.46  --  4.72 4.31  --  4.56   Hemoglobin 13.5  --  14.4 13.2  --  13.7  Hematocrit 41.2  --  43.3 39.6  --  42.0   Glucose 183* 250* 241* 218*  More results in Results Review  --    BUN 23 23 19 21   More results in Results Review  --    Creatinine 0.9 0.9 0.8 0.8  More results in Results Review  --    Calcium  10.3* 10.3* 10.5* 9.1  More results in Results Review  --    Sodium 139 138 137 136  More results in Results Review  --    Potassium 3.7 4.1 4.4 3.9  More results in Results Review  --    Chloride 103 99 100 105  More results in Results Review  --    CO2 26 28 25 27   More results in Results Review  --    More results in Results Review = values in this interval not displayed.   Platelets 367K        Imaging:         Outside imaging and imaging available in Epic were directly reviewed     CXR 09/14/22  Interval progression of a left-sided pleural effusion with associated  atelectasis and/or pneumonia.       CXR post tap  1.Slight improvement of moderate to large left pleural  effusion/atelectasis. Superimposed pneumonia is less likely, but cannot be  excluded.  2.Stable retrocardiac atelectasis.      Signed by: Leeroy JAYSON Citizen, MD    [facility] Pulmonology  Spectra  [phone]

## 2023-09-17 ENCOUNTER — Inpatient Hospital Stay: Payer: Medicare Other

## 2023-09-17 LAB — CBC
Absolute nRBC: 0 10*3/uL (ref <=0.00)
Hematocrit: 41.7 % (ref 37.6–49.6)
Hemoglobin: 13.3 g/dL (ref 12.5–17.1)
MCH: 30 pg (ref 25.1–33.5)
MCHC: 31.9 g/dL (ref 31.5–35.8)
MCV: 93.9 fL (ref 78.0–96.0)
MPV: 12.1 fL (ref 8.9–12.5)
Platelet Count: 329 10*3/uL (ref 142–346)
RBC: 4.44 10*6/uL (ref 4.20–5.90)
RDW: 14 % (ref 11–15)
WBC: 14.79 10*3/uL — ABNORMAL HIGH (ref 3.10–9.50)
nRBC %: 0 /100{WBCs} (ref <=0.0)

## 2023-09-17 LAB — WHOLE BLOOD GLUCOSE POCT
Whole Blood Glucose POCT: 201 mg/dL — ABNORMAL HIGH (ref 70–100)
Whole Blood Glucose POCT: 211 mg/dL — ABNORMAL HIGH (ref 70–100)
Whole Blood Glucose POCT: 231 mg/dL — ABNORMAL HIGH (ref 70–100)
Whole Blood Glucose POCT: 94 mg/dL (ref 70–100)
Whole Blood Glucose POCT: 169 mg/dL — ABNORMAL HIGH (ref 70–100)

## 2023-09-17 MED ORDER — LORAZEPAM 2 MG/ML IJ SOLN
1.0000 mg | Freq: Three times a day (TID) | INTRAMUSCULAR | Status: DC | PRN
Start: 2023-09-17 — End: 2023-09-17

## 2023-09-17 MED ORDER — POTASSIUM CHLORIDE CRYS ER 20 MEQ PO TBCR
0.0000 meq | EXTENDED_RELEASE_TABLET | ORAL | Status: DC | PRN
Start: 2023-09-17 — End: 2023-09-17

## 2023-09-17 MED ORDER — IOHEXOL 350 MG/ML IV SOLN
100.0000 mL | Freq: Once | INTRAVENOUS | Status: AC | PRN
Start: 2023-09-17 — End: 2023-09-17
  Administered 2023-09-17: 100 mL via INTRAVENOUS

## 2023-09-17 MED ORDER — MAGNESIUM SULFATE IN D5W 1-5 GM/100ML-% IV SOLN
1.0000 g | INTRAVENOUS | Status: DC | PRN
Start: 2023-09-17 — End: 2023-09-17

## 2023-09-17 MED ORDER — POTASSIUM CHLORIDE 20 MEQ PO PACK
0.0000 meq | PACK | ORAL | Status: DC | PRN
Start: 2023-09-17 — End: 2023-09-17

## 2023-09-17 MED ORDER — FUROSEMIDE 20 MG PO TABS
20.0000 mg | ORAL_TABLET | Freq: Every day | ORAL | Status: DC
Start: 2023-09-17 — End: 2023-09-20
  Administered 2023-09-17 – 2023-09-20 (×4): 20 mg via ORAL
  Filled 2023-09-17 (×4): qty 1

## 2023-09-17 MED ORDER — LORAZEPAM 2 MG/ML IJ SOLN
1.0000 mg | Freq: Once | INTRAMUSCULAR | Status: AC | PRN
Start: 2023-09-17 — End: 2023-09-17
  Administered 2023-09-17: 1 mg via INTRAVENOUS
  Filled 2023-09-17: qty 1

## 2023-09-17 MED ORDER — POTASSIUM CHLORIDE 10 MEQ/100ML IV SOLN (WRAP)
10.0000 meq | INTRAVENOUS | Status: DC | PRN
Start: 2023-09-17 — End: 2023-09-17

## 2023-09-17 MED ORDER — GADOTERATE MEGLUMINE 7.5 MMOL/15ML IV SOLN (CLARISCAN)
15.0000 mL | Freq: Once | INTRAVENOUS | Status: AC | PRN
Start: 2023-09-17 — End: 2023-09-17
  Administered 2023-09-17: 15 mL via INTRAVENOUS

## 2023-09-17 NOTE — Progress Notes (Addendum)
 Brief background: 83 year old male with past medical history significant for COPD, chronic emphysema, CAD, paroxysmal A-fib on warfarin, OSA, GERD, hypertension, hyperlipidemia, diabetes, presented to hospital on 1/5 with dyspnea, found to have hypoxic respiratory failure secondary to left lung pneumonia and left lung effusion      Thoracentesis 1/7 1.2L removed and sample sent. Cytology came back which showed carcinoma   Pulmo and Oncology updated patient's son regarding results.  D- dimer elevated. CTA neg for PE  Hyperglycemia - commenced on Lantus  and sliding scale.  On IV Lasix   Coumadin  stopped,Lovenox  started 1/10  Metformin  on hold due to pending scans     Last 24 Hrs (any critical events):  stable overnight. Remains on O2 at 3L. MRI brain done 1/12      LDA: G18 Left     Abnormal labs/VS (needing any orders):  WBC 14.79     Neuro changes: A/O x4     Respiratory: 3L oxygen NC                           GI/GU: Continent x2, LBM 1/11. Intake and output measured     Nutrition: Regular, Consistent Carb     Activity/ Safety/ Mobility: independent      Questions (need for PT/OT, sitter): independent      Tele/ Rhythm: no tele     Skin/ Wounds: scattered bruising, BLE +1 edema      Case management needs:      Procedures: For CT Abdo pelvis   As per Pulmonology,for possible medical pleuroscopy versus EBUS TBNA + chest tube/IPC placement early next week based on logistics and availability.      Discharge plan: Home w [facility]

## 2023-09-17 NOTE — Progress Notes (Addendum)
 MEDICINE PROGRESS NOTE    Date Time: 09/17/23 12:47 PM  Patient Name: Carl [name]  Attending Physician: Ahmed Thedford SAUNDERS, MD    Assessment / Plan:   83 year old male with past medical history significant for COPD, chronic emphysema, CAD, paroxysmal A-fib on warfarin, OSA, GERD, hypertension, hyperlipidemia, diabetes, presented to hospital on 1/5 with dyspnea, found to have hypoxic respiratory failure secondary to left lung pneumonia and left lung effusion    #Acute hypoxic respiratory failure, currently on 3 L of oxygen via nasal cannula.  CTA chest Negative for pulmonary embolism however shows extensive left pleural nodularity pericardial thickening and enlarged left anterior cardiophrenic lymph node compatible with metastatic disease, persistent left pleural effusion slightly decreased and complete left lower lobe atelectasis and intermediate left adrenal nodule.  Echocardiogram shows concentric left ventricular remodeling, LVEF 73%.    # Metastatic adenocarcinoma of unclear primary, pleural fluid cytology came back positive for metastatic adenocarcinoma, primary source is currently unknown.  Consulted oncology, ordered CT abdomen pelvis with and without contrast shows no potential primary tumor in abdomen or pelvis except for Small left adrenal gland nodule represents potential metastasis. MRI brain pending. Consulted interventional pulmonology for pleural biopsy and Pleurx cath for recurrent pleural effusion (timing for procedure is not established as of yet). Follow-up their recommendations. General pulmonology recommendations appreciated.    #Left pleural effusion, underwent thoracentesis by [facility] pulmonology on 1/7, 1.3 L was drained, repeat chest x-ray showed improved but persistent effusion.  Repeat ultrasound reported worsening effusion but bedside ultrasound does not support this.     #Left lower lobe pneumonia- improving, on cefepime , WBC 14.7k down trending    #Mediastinal lymphadenopathy,  following up with cytology of pleural effusion to rule out malignancy    #COPD, not in exacerbation, remains on Breo Ellipta , DuoNebs every 6 hours, [facility] pulmonology is following as well.  Received dupixent  300 mg on 1/7     #Paroxysmal A-fib, on warfarin as outpatient. Warfarin on Hold and currently on full dose lovenox  1mg /kg BID. Will hold lovenox  for anticipated Interventional pulmonology procedure for pleural biopsy/Pleurx cath.  Right now we do not have an anticipated date for the procedure.  Will continue with Lovenox  full dose twice daily.  Cont with metoprolol      #Diabetes mellitus type 2, A1c 7.4, on glipizide  and metformin  at home--Continue with Lantus  and Humalog  sliding scale.    #HTN, CAD on aspirin, metoprolol , atorvastatin .Held home med Imdur  30, lisinopril , amlodipine  given borderline soft blood pressure.    #HFpEF, likely in mild exacerbation, BNP elevated to 1300.  Repeating echo reviewed. Resumed PO Lasix  20 mg daily.    For DVT prophylaxis, on Lovenox   Code Status: Full code        Safety Checklist:     DVT prophylaxis:  CHEST guideline (See page [id number]) Chemical   Foley:  [place] Rn Foley protocol Not present   IVs:  Peripheral IV   PT/OT: Ordered   Daily CBC & or Chem ordered:  SHM/ABIM guidelines (see #5) No   Reference for approximate charges of common labs: CBC auto diff - $76  BMP - $99  Mg - $79    Lines:     Patient Lines/Drains/Airways Status       Active PICC Line / CVC Line / PIV Line / Drain / Airway / Intraosseous Line / Epidural Line / ART Line / Line / Wound / Pressure Ulcer / NG/OG Tube       Name Placement date Placement time Site  Days    Peripheral IV 09/10/23 22 G Diffusion Left Antecubital 09/10/23  1954  Antecubital  1                     Disposition: (Please see PAF column for Expected D/C Date)   Today's date: 09/17/2023  Admit Date: 09/10/2023  7:27 PM  LOS: 7  Clinical Milestones: copd, effusion   Anticipated discharge needs: tbd      Subjective     CC:  Hypoxia    HPI/Subjective: 83 year old male with past medical history significant for COPD, chronic emphysema, CAD, paroxysmal A-fib on warfarin, OSA, GERD, hypertension, hyperlipidemia, diabetes, presented to hospital on 1/5 with dyspnea  At presentation blood pressure was 130/74 heart rate 116 temperature 98.7 saturating 87% on room air  Blood work showed WBC of 15, hemoglobin of 15.7 platelet count 367  BMP was grossly unremarkable except blood glucose of 220, lactic acid of 3.2, BNP 1000 INR 2.9  Chest x-ray showed moderate left pleural effusion  Admitted to hospitalist service with acute hypoxic respiratory failure secondary to left-sided pneumonia and effusion, started on cefepime  and azithromycin   [facility] pulmonology was consulted for possible thoracentesis  His warfarin was held, he was started on enoxaparin   Pulmonology recommended thoracentesis when INR is less than 1.5  Underwent thoracentesis on 1/7, 1.3 L was removed  Repeat chest x-ray showed improved but persistent effusion  1/9, on 0.5 L O2 via NC. BG up to 300s. Repeat chest XR is slightly better but still has effusion. Cytology pending  1/10, on 3 L, repeat chest x-ray shows persistent effusion.  Discussed with pulmonology, bedside ultrasound does not show significant effusion.  Getting D-dimer, if elevated will have CTA chest to rule out PE.  Switch furosemide  to IV.  Ordered echocardiogram and BNP.  Started on Lantus  10 units    On 1/10 patient was seen at 9:45 AM with nurse Elyn  No acute events noted overnight, he was started on oxygen again  This morning patient seems to be stable, denies chest pain, dyspnea, abdominal pain    1/11:Increase the dose of metoprolol  from 100 daily to 100 twice daily since the heart rate is around 110s.  Held other home antihypertensive medications since blood pressure is borderline soft.  Consulted interventional pulmonology for Pleurx cath and pleural biopsy. Held IV Lasix .    1/12: No overnight acute events,  continue to be on 3 L of oxygen.  Heart rate in 100s.  Denies fever, nausea, vomiting.  Urine and bowel continent.  Tolerating oral diet.      Review of Systems:     As per HPI    Physical Exam:     VITAL SIGNS PHYSICAL EXAM   Temp:  [97.5 F (36.4 C)-97.9 F (36.6 C)] 97.5 F (36.4 C)  Heart Rate:  [92-122] 114  Resp Rate:  [14-22] 22  BP: (104-145)/(56-80) 123/66  Blood Glucose:    Telemetry:       Intake/Output Summary (Last 24 hours) at 09/17/2023 1247  Last data filed at 09/17/2023 9471  Gross per 24 hour   Intake 400 ml   Output 470 ml   Net -70 ml      Physical Exam  General: Well developed, well nourished, obese elderly male, in no apparent distress  Head and Neck: Atraumatic, normocephalic, PERRL, extraocular movements intact, no scleral icterus, wearing nasal cannula  Cardiovascular: Irregularly irregular rate and rhythm, no murmurs  Lungs: Decreased breath sounds at the bases  bilaterally, no wheezing  Abdomen: Soft, non-tender, non-distended  Extremities: No edema         Meds:     Medications were reviewed:  Current Facility-Administered Medications   Medication Dose Route Frequency    albuterol -ipratropium  3 mL Nebulization Q6H WA    allopurinol   300 mg Oral Daily    atorvastatin   40 mg Oral Daily    cefepime   2 g Intravenous Q12H    enoxaparin   1 mg/kg Subcutaneous Q12H    fluticasone  furoate-vilanterol  1 puff Inhalation QAM    insulin  glargine  10 Units Subcutaneous QAM    insulin  lispro  1-5 Units Subcutaneous TID AC    metoprolol  succinate  100 mg Oral Q12H    senna-docusate  1 tablet Oral QHS     Infusion Meds[1]  PRN Medications[2]      Labs:     Labs (last 72 hours):    Recent Labs   Lab 09/17/23  0353 09/16/23  0317   WBC 14.79* 15.45*   Hemoglobin 13.3 13.5   Hematocrit 41.7 41.2   Platelet Count 329 365*       Recent Labs   Lab 09/16/23  0317 09/15/23  1721   PT 17.1* 16.1*   INR 1.5 1.4   PTT  --  33    Recent Labs   Lab 09/16/23  0317 09/15/23  1721 09/12/23  0354 09/11/23  0617  09/10/23  1946   Sodium 139 138  More results in Results Review 141 140   Potassium 3.7 4.1  More results in Results Review 4.1 3.9   Chloride 103 99  More results in Results Review 103 101   CO2 26 28  More results in Results Review 23 26   BUN 23 23  More results in Results Review 28 29*   Creatinine 0.9 0.9  More results in Results Review 0.9 0.9   Calcium  10.3* 10.3*  More results in Results Review 9.5 10.5*   Albumin   --   --   --  2.4* 3.0*   Protein, Total  --   --   --  6.5 8.2   Bilirubin, Total  --   --   --  0.6 0.8   Alkaline Phosphatase  --   --   --  141* 192*   ALT  --   --   --  17 25   AST (SGOT)  --   --   --  43* 36   Glucose 183* 250*  More results in Results Review 188* 220*   More results in Results Review = values in this interval not displayed.                   Microbiology, reviewed and are significant for:  Microbiology Results (last 15 days)       Procedure Component Value Units Date/Time    Culture and Gram Stain, Aerobic and Anaerobic Bacteria, Wound/Tissue/Fluid (Order) [[id number]] Collected: 09/12/23 1131    Order Status: Completed Specimen: Pleural Fluid from Pleural Cavity, Left Updated: 09/16/23 1433    Narrative:      The following orders were created for panel order Culture and Gram Stain, Aerobic and Anaerobic Bacteria, Wound/Tissue/Fluid (Order).  Procedure                               Abnormality         Status                     ---------                               -----------         ------  Culture And Gram Stain,.SABRASABRA[[id number]]                      Final result                 Please view results for these tests on the individual orders.    Culture And Gram Stain, Aerobic Bacteria, Wound/Tissue/Fluid [[id number]] Collected: 09/12/23 1131    Order Status: Completed Specimen: Pleural Fluid from Pleural Cavity, Left Updated: 09/16/23 1433     Culture Aerobic Bacteria No growth     Gram Stain Many WBCs      No organisms seen    Narrative:      Stain  performed on cytospin (concentrated) specimen.     Urine Streptococcus pneumoniae Antigen [[id number]]  (Normal) Collected: 09/11/23 0602    Order Status: Completed Specimen: Urine, Clean Catch Updated: 09/11/23 2055     Urine Streptococcus pneumoniae Antigen Negative     Comment: This is a presumptive test for the direct qualitative detection of bacterial antigen. This test is not intended as a substitute for a gram stain and bacterial culture. Samples with extremely low levels of antigen may yield negative results.       Urine Legionella pneumophila serogroup 1 Antigen [[id number]]  (Normal) Collected: 09/11/23 0602    Order Status: Completed Specimen: Urine, Clean Catch Updated: 09/11/23 2053     Urine Legionella pneumophila serogroup 1 Antigen Negative     Comment: This test will not detect infections caused by other Legionella pneumophila serogroups or by other Legionella species. A negative antigen result does not exclude infection with Legionella pneumophila serogroup 1.       Nares, MRSA (methicillin-resistant Staphylococcus aureus) Screening, PCR [[id number]]  (Normal) Collected: 09/11/23 0143    Order Status: Completed Specimen: Swab from Nares Updated: 09/11/23 0426     MRSA (methicillin resistant Staphylococcus aureus) DNA Not Detected     Comment: Testing performed with the Xpert MRSA NxG assay. This test is intended for the detection of methicillin-resistant Staphylococcus aureus (MRSA) DNA. This test is not intended to diagnose, guide, or monitor treatment for MRSA infections, or provide results of susceptibility to methicillin. A positive test result does not necessarily indicate the presence of viable organisms. A negative result does not preclude MRSA nasal colonization.       Culture, Sputum and Lower Respiratory [[id number]]     Order Status: Sent Specimen: Sputum, Expectorated     COVID-19 and Influenza ([place]) (symptomatic) [[id number]]  (Normal) Collected: 09/10/23 1946    Order Status:  Completed Specimen: Swab from Anterior Nares Updated: 09/10/23 2019     SARS-CoV-2 (COVID-19) RNA Not Detected     Influenza A RNA Not Detected     Influenza B RNA Not Detected    Narrative:      A result of Detected indicates POSITIVE for the presence of viral RNA  A result of Not Detected indicates NEGATIVE for the presence of viral RNA    Test performed using the Roche cobas [place] SARS-CoV-2 & Influenza A/B assay. This is a multiplex real-time RT-PCR assay for the detection of SARS-CoV-2, influenza A, and influenza B virus RNA. Viral nucleic acids may persist in vivo, independent of viability. Detection of viral nucleic acid does not imply the presence of infectious virus, or that virus nucleic acid is the cause of clinical symptoms. Negative results do not preclude SARS-CoV-2, influenza A, and/or influenza B infection and should not be used as the sole basis for diagnosis, treatment  or other patient management decisions. Invalid results may be due to inhibiting substances in the specimen and recollection should occur.     Culture, Blood, Aerobic And Anaerobic [[id number]] Collected: 09/10/23 1946    Order Status: Completed Specimen: Blood, Venous Updated: 09/15/23 2300     Culture Blood No growth at 5 days                Signed by: Thedford JONELLE Olmsted, MD        This note was generated within the [employer] EMR using Dragon medical speech recognition software and may contain inherent errors or omissions not intended by the user. Grammatical and punctuation errors, random word insertions, deletions, pronoun errors and incomplete sentences are occasional consequences of this technology due to software limitations. Not all errors are caught or corrected. Although every attempt is made to root out erroneus and incomplete transcription, the note may still not fully represent the intent or opinion of the author. If there are questions or concerns about the content of this note or information contained within the body of this  dictation they should be addressed directly with the author for clarification. '         [1]   Current Facility-Administered Medications   Medication Dose Route Frequency Last Rate   [2]   Current Facility-Administered Medications   Medication Dose Route    acetaminophen   650 mg Oral    benzocaine -menthol   1 lozenge Buccal    benzonatate   100 mg Oral    calcium  carbonate  500 mg Oral    carboxymethylcellulose sodium  1 drop Both Eyes    dextrose   15 g of glucose Oral    Or    dextrose   12.5 g Intravenous    Or    dextrose   12.5 g Intravenous    Or    glucagon  (rDNA)  1 mg Intramuscular    dextrose   15 g of glucose Oral    Or    dextrose   12.5 g Intravenous    Or    dextrose   12.5 g Intravenous    Or    glucagon  (rDNA)  1 mg Intramuscular    melatonin  3 mg Oral    naloxone   0.2 mg Intravenous    potassium & sodium phosphates   2 packet Oral    potassium chloride   0-60 mEq Oral    Or    potassium chloride   0-60 mEq Oral    Or    potassium chloride   10 mEq Intravenous    saline  2 spray Each Nare

## 2023-09-17 NOTE — Plan of Care (Addendum)
 Brief background: 83 year old male with past medical history significant for COPD, chronic emphysema, CAD, paroxysmal A-fib on warfarin, OSA, GERD, hypertension, hyperlipidemia, diabetes, presented to hospital on 1/5 with dyspnea, found to have hypoxic respiratory failure secondary to left lung pneumonia and left lung effusion      Thoracentesis 1/7 1.2L removed and sample sent. Cytology came back which showed carcinoma   Pulmo and Oncology updated patient's son regarding results.  D- dimer elevated. CTA neg for PE  Hyperglycemia - commenced on Lantus  and sliding scale.  On IV Lasix   Coumadin  stopped,Lovenox  started 1/10  Metformin  on hold due to pending scans     Last 24 Hrs (any critical events):Remains on O2 at 3L. MRI brain done 1/12 in the morning, CT abdomen done at 1200     LDA: G18 Left     Abnormal labs/VS (needing any orders):  WBC 14.79     Neuro changes: A/O x4     Respiratory: 3L oxygen NC                           GI/GU: Continent x2, LBM 1/11. Intake and output measured     Nutrition: Regular, Consistent Carb     Activity/ Safety/ Mobility: independent      Questions (need for PT/OT, sitter): independent      Tele/ Rhythm: no tele     Skin/ Wounds: scattered bruising, BLE +1 edema      Case management needs:      Procedures: N/A  As per Pulmonology,for possible medical pleuroscopy versus EBUS TBNA + chest tube/IPC placement early next week based on logistics and availability.       Discharge plan: Home w HH      Problem: Moderate/High Fall Risk Score >5  Goal: Patient will remain free of falls  Outcome: Progressing  Flowsheets (Taken 09/17/2023 1020)  Moderate Risk (6-13):   MOD-Place bedside commode and assistive devices out of sight when not in use   MOD-Re-orient confused patients   MOD-Utilize diversion activities   MOD-Request PT/OT consult order for patients with gait/mobility impairment   MOD-Use gait belt when appropriate   MOD- Consider video monitoring     Problem: Compromised Sensory  Perception  Goal: Sensory Perception Interventions  Outcome: Progressing  Flowsheets (Taken 09/16/2023 2030 by Thurnell Andy Dines, RN)  Sensory Perception Interventions: Offload heels, Pad bony prominences, Reposition q 2hrs/turn Clock, Q2 hour skin assessment under devices if present     Problem: Compromised Activity/Mobility  Goal: Activity/Mobility Interventions  Outcome: Progressing  Flowsheets (Taken 09/16/2023 2030 by Thurnell Andy Dines, RN)  Activity/Mobility Interventions: Pad bony prominences, TAP Seated positioning system when OOB, Promote PMP, Reposition q 2 hrs / turn clock, Offload heels     Problem: Inadequate Gas Exchange  Goal: Adequate oxygenation and improved ventilation  Outcome: Progressing  Goal: Patent Airway maintained  Outcome: Progressing  Flowsheets (Taken 09/11/2023 2111 by Hyla Round, RN)  Patent airway maintained:   Position patient for maximum ventilatory efficiency   Provide adequate fluid intake to liquefy secretions   Suction secretions as needed   Reinforce use of ordered respiratory interventions (i.e. CPAP, BiPAP, Incentive Spirometer, Acapella, etc.)   Reposition patient every 2 hours and as needed unless able to self-reposition     Problem: Compromised Moisture  Goal: Moisture level Interventions  Outcome: Progressing  Flowsheets (Taken 09/16/2023 2030 by Thurnell Andy Dines, RN)  Moisture level Interventions: Moisture wicking products, Moisture barrier cream  Problem: Pain interferes with ability to perform ADL  Goal: Pain at adequate level as identified by patient  Outcome: Progressing     Problem: Side Effects from Pain Analgesia  Goal: Patient will experience minimal side effects of analgesic therapy  Outcome: Progressing  Flowsheets (Taken 09/16/2023 1054)  Patient will experience minimal side effects of analgesic therapy: Monitor/assess patient's respiratory status (RR depth, effort, breath sounds)

## 2023-09-17 NOTE — Progress Notes (Signed)
 PULMONARY INPATIENT PROGRESS NOTE  [phone] (MD Spectralink #1) / [phone] (MD Spectralink #2) / [phone] (PA Spectralink)        Date Time: 09/17/23 10:27 AM  Patient Name: Carl [name]      Assessment:     Left effusion-metastatic adenocarcinoma, primary unclear              -has pleural disease on CT   -IP assessment appreciated  2. COPD-continue nebs/Breo  3. Paroxysmal atrial fib-on coumadin  for 20 years-now on lovenox  in anticipation of further procedures  4. CAD-s/p CABG in 2005  5. OSA-not on CPAP currently  6. HTN/Hyperlipidemia/DM  7. Family history of HIT, son, not pt    Plan:   Day 7 cefepime , can stop from my standpoint  Continue nebs/Breo  O2 as needed  Await CT abd/pelvis and then plan can be made for further eval  Lovenox  for anticoagulation    Leeroy JAYSON Citizen, MD  09/17/23  10:27 AM      Interval History     Interval History/24 hr. Events:      Review of Systems:   Reviewed and no changes      Physical Exam:     VITAL SIGNS PHYSICAL EXAM   Vitals:    09/17/23 0646   BP: 145/80   Pulse: (!) 104   Resp: 22   Temp: 97.7 F (36.5 C)   SpO2: 96%     Temp (24hrs), Avg:97.7 F (36.5 C), Min:97.5 F (36.4 C), Max:97.9 F (36.6 C)                SpO2: 96 % (09/17/2023  6:46 AM)  O2 Device: Nasal cannula (09/17/2023  6:46 AM)  O2 Flow Rate (L/min): 3 L/min (09/17/2023  6:46 AM)     Physical Exam  General: awake, alert, breathing comfortably, no acute distress  Cardiovascular: RRR  Neck: No JVD  Lungs: Diminished over left lower lung base, dull 1/2 up  Abdomen: soft, non-tender, non-distended; no palpable masses,  normoactive bowel sounds  Extremities: Trace edema BLE  Pulse: equal pulses,  Neurological: Alert and oriented X3, mood and affect normal         Meds:     Current Facility-Administered Medications   Medication Dose Route Frequency    albuterol -ipratropium  3 mL Nebulization Q6H WA    allopurinol   300 mg Oral Daily    atorvastatin   40 mg Oral Daily    cefepime   2 g Intravenous Q12H     enoxaparin   1 mg/kg Subcutaneous Q12H    fluticasone  furoate-vilanterol  1 puff Inhalation QAM    insulin  glargine  10 Units Subcutaneous QAM    insulin  lispro  1-5 Units Subcutaneous TID AC    metoprolol  succinate  100 mg Oral Q12H    senna-docusate  1 tablet Oral QHS       Labs:     Recent Labs   Lab 09/17/23  0353 09/16/23  0317 09/15/23  1721 09/15/23  0348 09/13/23  0449   WBC 14.79* 15.45*  --  16.14* 14.36*   RBC 4.44 4.46  --  4.72 4.31   Hemoglobin 13.3 13.5  --  14.4 13.2   Hematocrit 41.7 41.2  --  43.3 39.6   Glucose  --  183* 250* 241* 218*   BUN  --  23 23 19 21    Creatinine  --  0.9 0.9 0.8 0.8   Calcium   --  10.3* 10.3* 10.5* 9.1   Sodium  --  139 138 137 136   Potassium  --  3.7 4.1 4.4 3.9   Chloride  --  103 99 100 105   CO2  --  26 28 25 27    Platelets 367K        Imaging:         Outside imaging and imaging available in Epic were directly reviewed     CXR 09/14/22  Interval progression of a left-sided pleural effusion with associated  atelectasis and/or pneumonia.       CXR post tap  1.Slight improvement of moderate to large left pleural  effusion/atelectasis. Superimposed pneumonia is less likely, but cannot be  excluded.  2.Stable retrocardiac atelectasis.      Signed by: Leeroy JAYSON Citizen, MD    [facility] Pulmonology  Spectra  [phone]

## 2023-09-18 ENCOUNTER — Ambulatory Visit (INDEPENDENT_AMBULATORY_CARE_PROVIDER_SITE_OTHER): Payer: Medicare Other | Admitting: Internal Medicine

## 2023-09-18 DIAGNOSIS — C801 Malignant (primary) neoplasm, unspecified: Secondary | ICD-10-CM

## 2023-09-18 LAB — CBC
Absolute nRBC: 0 10*3/uL (ref <=0.00)
Hematocrit: 41.9 % (ref 37.6–49.6)
Hemoglobin: 13.7 g/dL (ref 12.5–17.1)
MCH: 30.8 pg (ref 25.1–33.5)
MCHC: 32.7 g/dL (ref 31.5–35.8)
MCV: 94.2 fL (ref 78.0–96.0)
MPV: 12.2 fL (ref 8.9–12.5)
Platelet Count: 290 10*3/uL (ref 142–346)
RBC: 4.45 10*6/uL (ref 4.20–5.90)
RDW: 14 % (ref 11–15)
WBC: 13.03 10*3/uL — ABNORMAL HIGH (ref 3.10–9.50)
nRBC %: 0 /100{WBCs} (ref <=0.0)

## 2023-09-18 LAB — WHOLE BLOOD GLUCOSE POCT
Whole Blood Glucose POCT: 128 mg/dL — ABNORMAL HIGH (ref 70–100)
Whole Blood Glucose POCT: 289 mg/dL — ABNORMAL HIGH (ref 70–100)
Whole Blood Glucose POCT: 247 mg/dL — ABNORMAL HIGH (ref 70–100)

## 2023-09-18 NOTE — Progress Notes (Signed)
PULSE OXIMETRY TESTING:     Document patient's oxygen at rest :   _____% on RA at rest (No ranges please).  IF 88% OR BELOW provide additional O2 and see next step,    _____% on oxygen, at rest on_____LPM via NC (document number of liters needed to maintain 89% or greater)       Next, test patient with ambulation (or exertion in bed IF bed bound) and document below:   _____% on RA with exertion (if 88% or below, provide additional O2 and see next step)   _____% on oxygen, with exertion, at_____LPM via NC (document number of liters needed to maintain 89% or greater)      All tests must be done during the same session.     Please copy and paste the above template and document results in a PROGRESS NOTE.     Testing to qualify for home oxygen must be no earlier than 48 hours prior to discharge, or it will need to be repeated.    Please contact X_______ when completed.

## 2023-09-18 NOTE — Progress Notes (Signed)
 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  Reviewed recent CT abdomen and pelvis.  No evidence of any metastatic disease or primary tumor source noted.  Based on these findings more consistent with lung primary adenocarcinoma.  Immunohistochemistry does not indicate serosal malignancy like mesothelioma.  Discussed these findings with patient and his son.  Will await brain MRI to rule out brain mets.  Reviewed interventional pulmonology note and agree with the plan.  Managing the malignant pleural effusion and obtaining additional tumor tissue for molecular analysis would be quite helpful both from the diagnosis and treatment perspective.  Will need outpatient PET/CT.  Patient and his son are in agreement with the plan.  We have answered their questions.    History:     Patient resting in bed during the visit today.  His son is also in the room.  Notes some shortness of breath this morning particular the exertion.  At rest much more comfortable.  No new complaints at this time.    Allergies:   Allergies[1]    Medications:     Current Facility-Administered Medications   Medication Dose Route Frequency    albuterol -ipratropium  3 mL Nebulization Q6H WA    allopurinol   300 mg Oral Daily    atorvastatin   40 mg Oral Daily    enoxaparin   1 mg/kg Subcutaneous Q12H    fluticasone  furoate-vilanterol  1 puff Inhalation QAM    furosemide   20 mg Oral Daily    insulin  glargine  10 Units Subcutaneous QAM    insulin  lispro  1-5 Units Subcutaneous TID AC     metoprolol  succinate  100 mg Oral Q12H    senna-docusate  1 tablet Oral QHS       Review of Systems:   Review of Systems   Constitutional:  Positive for malaise/fatigue.   HENT: Negative.     Eyes: Negative.    Respiratory:  Positive for shortness of breath. Negative for cough.    Cardiovascular: Negative.    Gastrointestinal: Negative.    Genitourinary: Negative.    Musculoskeletal: Negative.    Skin: Negative.    Neurological:  Positive for weakness.   Psychiatric/Behavioral: Negative.          Physical Exam:     Vitals:    09/18/23 0023 09/18/23 0408 09/18/23 0721 09/18/23 1002   BP: 119/75 122/76 128/81 116/89   Pulse: 81 77 (!) 111 (!) 114   Resp: 22 (!) 24 18    Temp: 97.9 F (36.6 C) 97.7 F (36.5 C) 97.5 F (36.4 C)    TempSrc: Oral Oral Oral    SpO2: 96% 95% 96%    Weight:       Height:            Intake and Output Summary (Last 24 hours) at Date Time    Intake/Output Summary (Last 24 hours) at 09/18/2023 1019  Last data filed at 09/17/2023 1830  Gross per 24 hour   Intake --   Output 400 ml   Net -400  ml     Physical Exam  Vitals reviewed.   Constitutional:       General: He is not in acute distress.     Appearance: He is obese. He is not ill-appearing.   HENT:      Head: Normocephalic and atraumatic.      Nose: No rhinorrhea.   Eyes:      General: No scleral icterus.        Right eye: No discharge.         Left eye: No discharge.      Extraocular Movements: Extraocular movements intact.      Conjunctiva/sclera: Conjunctivae normal.   Cardiovascular:      Rate and Rhythm: Regular rhythm. Tachycardia present.      Heart sounds: No murmur heard.     No gallop.   Pulmonary:      Effort: Pulmonary effort is normal. No respiratory distress.      Breath sounds: Examination of the left-middle field reveals decreased breath sounds. Examination of the left-lower field reveals decreased breath sounds. Decreased breath sounds present. No wheezing or rales.   Abdominal:      General: Bowel sounds are normal. There is  no distension.      Palpations: Abdomen is soft.      Tenderness: There is no abdominal tenderness. There is no guarding.   Musculoskeletal:         General: Normal range of motion.      Cervical back: Normal range of motion. No rigidity.      Right lower leg: Edema present.      Left lower leg: Edema present.   Lymphadenopathy:      Cervical: No cervical adenopathy.   Skin:     General: Skin is warm.      Coloration: Skin is not jaundiced.   Neurological:      General: No focal deficit present.      Mental Status: He is alert and oriented to person, place, and time.      Cranial Nerves: No cranial nerve deficit.   Psychiatric:         Mood and Affect: Mood normal.         Behavior: Behavior normal.        Labs Reviewed:     Results       Procedure Component Value Units Date/Time    Whole Blood Glucose POCT [[id number]]  (Abnormal) Collected: 09/18/23 0822    Specimen: Blood, Capillary Updated: 09/18/23 0849     Whole Blood Glucose POCT 128 mg/dL     CBC without Differential [[id number]]  (Abnormal) Collected: 09/18/23 0408    Specimen: Blood, Venous Updated: 09/18/23 0445     WBC 13.03 x10 3/uL      Hemoglobin 13.7 g/dL      Hematocrit 58.0 %      Platelet Count 290 x10 3/uL      MPV 12.2 fL      RBC 4.45 x10 6/uL      MCV 94.2 fL      MCH 30.8 pg      MCHC 32.7 g/dL      RDW 14 %      nRBC % 0.0 /100 WBC      Absolute nRBC 0.00 x10 3/uL     Whole Blood Glucose POCT [[id number]]  (Abnormal) Collected: 09/17/23 2108    Specimen: Blood, Capillary Updated: 09/17/23 2119     Whole Blood Glucose POCT 169 mg/dL  Whole Blood Glucose POCT [[id number]]  (Normal) Collected: 09/17/23 1649    Specimen: Blood, Capillary Updated: 09/17/23 1653     Whole Blood Glucose POCT 94 mg/dL     Whole Blood Glucose POCT [[id number]]  (Abnormal) Collected: 09/17/23 1217    Specimen: Blood, Capillary Updated: 09/17/23 1222     Whole Blood Glucose POCT 231 mg/dL     Whole Blood Glucose POCT [[id number]]  (Abnormal) Collected: 09/17/23 1201     Specimen: Blood, Capillary Updated: 09/17/23 1217     Whole Blood Glucose POCT 211 mg/dL               Rads:   CT Abdomen Pelvis W IV/ WO PO Cont    Result Date: 09/17/2023  1. Extensive metastatic malignancy in the chest, described in full on earlier chest CT. 2. No potential primary tumor detectable in the abdomen or pelvis. 3. Small left adrenal gland nodule represents potential metastasis. 4. Moderate stool distention colon and rectum. 5. Remainder as above. Wolm Luis, MD 09/17/2023 11:54 AM    MRI Brain W WO Contrast    Result Date: 09/17/2023   1.No evidence of intracranial metastases. Alex Jude Crawford Ruth, MD 09/17/2023 10:20 AM    CT Angiogram Chest    Result Date: 09/15/2023  1. Negative for pulmonary embolism. 2. Extensive left pleural nodularity, pericardial thickening and an enlarged left anterior cardiophrenic lymph node compatible with metastatic disease. 3. Persistent left pleural effusion, slightly decreased. Persistent almost complete left lower lobe atelectasis. 4. Indeterminate left adrenal nodule. Theopolis Rowan, MD 09/15/2023 6:27 PM    XR Chest AP Portable    Result Date: 09/15/2023  Interval progression of a left-sided pleural effusion with associated atelectasis and/or pneumonia. Lindie Cunning, MD 09/15/2023 5:51 AM    XR Chest AP Portable    Result Date: 09/14/2023  Unchanged small to moderate left pleural effusion. Theopolis Rowan, MD 09/14/2023 4:21 PM    XR Chest AP Portable    Result Date: 09/12/2023  1.Slight improvement of moderate to large left pleural effusion/atelectasis. Superimposed pneumonia is less likely, but cannot be excluded. 2.Stable retrocardiac atelectasis. Lillette CHRISTELLA Gillis, MD 09/12/2023 12:42 PM       Signed by: Jocelyne Robin, MD        [facility]  [address]   [place] [address]  [phone]        [1]   Allergies  Allergen Reactions    Cephalexin Rash

## 2023-09-18 NOTE — Plan of Care (Signed)
 Mr. Mario is an 83 yo male with h/o COPD, CAD, and AFib found to have new pleural effusion now +metastatic adeno of unknown primary.    -Plan for bronchsocopy, EBUS biopsy for further tissue and L pleurx catheter placement tomorrow  -NPO at MN  -Please hold therapeutic lovenox  tonight and tomorrow morning prior to the biopsy      Dayla Redbird, PA-C  Interventional Pulmonology/Thoracic Surgery Physician Assistant  Interventional Pulmonology team spectra  [phone]

## 2023-09-18 NOTE — Progress Notes (Signed)
 PULMONARY INPATIENT PROGRESS NOTE  [phone] (MD Spectralink #1) / [phone] (MD Spectralink #2) / [phone] (PA Spectralink)        Date Time: 09/18/23 10:37 AM  Patient Name: Carl [name]      Assessment:     Left effusion-metastatic adenocarcinoma, primary unclear              -has pleural disease on CT, new from June 2024   -IP assessment appreciated, for possible Pleurx and bronch for tissue  - CT Abd/pelvis neg  - Brain MRI neg  2. COPD, not exacerbated  - s/p 7 days empiric Cefepime    3. Paroxysmal atrial fib-on coumadin  for 20 years-now on lovenox  in anticipation of further procedures  4. CAD-s/p CABG in 2005  5. OSA-not on CPAP currently  6. HTN/Hyperlipidemia/DM  7. Family history of HIT, son, not pt    Plan:   Continue nebs/Breo  Wean O2 for sats >92%  For possible Pleurx/bronch with IP  Lovenox  to be held for procedure once procedures scheduled  5. Will follow peripherally. Please contact with questions or concerns.   Interval History     Interval History/24 hr. Events:  Remains on 3L. MRI brain and CT abd/pelvis negative.  Seen by IP. Seen for Oncology. No ON events.     Review of Systems:   Reviewed and no changes      Physical Exam:     VITAL SIGNS PHYSICAL EXAM   Vitals:    09/18/23 1002   BP: 116/89   Pulse: (!) 114   Resp:    Temp:    SpO2:      Temp (24hrs), Avg:97.8 F (36.6 C), Min:97.3 F (36.3 C), Max:98.8 F (37.1 C)                SpO2: 96 % (09/18/2023  7:21 AM)  O2 Device: Nasal cannula (09/18/2023  7:21 AM)  O2 Flow Rate (L/min): 3 L/min (09/18/2023  7:21 AM)     Physical Exam  General: awake, alert, breathing comfortably, no acute distress  Cardiovascular: RRR  Neck: No JVD  Lungs: Diminished over left lower lung base  Abdomen: soft, non-tender, non-distended; no palpable masses,  normoactive bowel sounds  Extremities: Trace edema BLE  Pulse: equal pulses,  Neurological: Alert and oriented X3, mood and affect normal         Meds:     Current Facility-Administered Medications    Medication Dose Route Frequency    albuterol -ipratropium  3 mL Nebulization Q6H WA    allopurinol   300 mg Oral Daily    atorvastatin   40 mg Oral Daily    enoxaparin   1 mg/kg Subcutaneous Q12H    fluticasone  furoate-vilanterol  1 puff Inhalation QAM    furosemide   20 mg Oral Daily    insulin  glargine  10 Units Subcutaneous QAM    insulin  lispro  1-5 Units Subcutaneous TID AC    metoprolol  succinate  100 mg Oral Q12H    senna-docusate  1 tablet Oral QHS       Labs:     Recent Labs   Lab 09/18/23  0408 09/17/23  0353 09/16/23  0317 09/15/23  1721 09/15/23  0348 09/13/23  0449   WBC 13.03* 14.79* 15.45*  --  16.14* 14.36*   RBC 4.45 4.44 4.46  --  4.72 4.31   Hemoglobin 13.7 13.3 13.5  --  14.4 13.2   Hematocrit 41.9 41.7 41.2  --  43.3 39.6   Glucose  --   --  183* 250* 241* 218*   BUN  --   --  23 23 19 21    Creatinine  --   --  0.9 0.9 0.8 0.8   Calcium   --   --  10.3* 10.3* 10.5* 9.1   Sodium  --   --  139 138 137 136   Potassium  --   --  3.7 4.1 4.4 3.9   Chloride  --   --  103 99 100 105   CO2  --   --  26 28 25 27    Platelets 367K        Imaging:         Outside imaging and imaging available in Epic were directly reviewed     CXR 09/14/22  Interval progression of a left-sided pleural effusion with associated  atelectasis and/or pneumonia.       CXR post tap  1.Slight improvement of moderate to large left pleural  effusion/atelectasis. Superimposed pneumonia is less likely, but cannot be  excluded.  2.Stable retrocardiac atelectasis.    I spent 35 minutes reviewing previous notes, imaging, labs and documenting findings in the note.   Signed by: Lina Minor, MD    [facility]  Spectra  [phone]

## 2023-09-18 NOTE — Plan of Care (Signed)
 Brief background: 83 year old male with past medical history significant for COPD, chronic emphysema, CAD, paroxysmal A-fib on warfarin, OSA, GERD, hypertension, hyperlipidemia, diabetes, presented to hospital on 1/5 with dyspnea, found to have hypoxic respiratory failure secondary to left lung pneumonia and left lung effusion     Last 24 Hrs (any critical events):   Pulse ox ambulatory test complete  PRN tylenol  for shoulder pain  Safety measures in place, purposeful rounding maintained   Son at bedside      LDA: RAC PIV     Abnormal labs/VS (needing any orders): VSS     Neuro changes: A/O x4     Respiratory: 3L oxygen NC                           GI/GU: Continent x2, LBM 1/13     Nutrition: Regular, Consistent Carb     Activity/ Safety/ Mobility: independent      Tele/ Rhythm: no tele     Skin/ Wounds: scattered bruising, BLE +1 edema      Procedures: Plan from bronchoscopy, EBUS biopsy for further tissue and L pleurx cath placement tmrw      Discharge plan: Home w HH    Problem: Moderate/High Fall Risk Score >5  Goal: Patient will remain free of falls  Outcome: Progressing  Flowsheets (Taken 09/18/2023 1000)  Moderate Risk (6-13):   MOD-Consider activation of bed alarm if appropriate   MOD-Floor mat at bedside (where available) if appropriate   MOD-Perform dangle, stand, walk (DSW) prior to mobilization   MOD-Request PT/OT consult order for patients with gait/mobility impairment     Problem: Inadequate Gas Exchange  Goal: Adequate oxygenation and improved ventilation  Outcome: Progressing  Flowsheets (Taken 09/18/2023 1826)  Adequate oxygenation and improved ventilation:   Monitor SpO2 and treat as needed   Assess lung sounds   Provide mechanical and oxygen support to facilitate gas exchange   Teach/reinforce use of incentive spirometer 10 times per hour while awake, cough and deep breath as needed   Plan activities to conserve energy: plan rest periods   Consult/collaborate with Respiratory Therapy   Increase  activity as tolerated/progressive mobility     Problem: Pain interferes with ability to perform ADL  Goal: Pain at adequate level as identified by patient  Outcome: Progressing  Flowsheets (Taken 09/18/2023 1826)  Pain at adequate level as identified by patient:   Identify patient comfort function goal   Assess for risk of opioid induced respiratory depression, including snoring/sleep apnea. Alert healthcare team of risk factors identified.   Assess pain on admission, during daily assessment and/or before any as needed intervention(s)   Reassess pain within 30-60 minutes of any procedure/intervention, per Pain Assessment, Intervention, Reassessment (AIR) Cycle   Evaluate if patient comfort function goal is met   Evaluate patient's satisfaction with pain management progress   Offer non-pharmacological pain management interventions   Consult/collaborate with Pain Service   Consult/collaborate with Physical Therapy, Occupational Therapy, and/or Speech Therapy   Include patient/patient care companion in decisions related to pain management as needed     Problem: Side Effects from Pain Analgesia  Goal: Patient will experience minimal side effects of analgesic therapy  Outcome: Progressing  Flowsheets (Taken 09/18/2023 1826)  Patient will experience minimal side effects of analgesic therapy:   Monitor/assess patient's respiratory status (RR depth, effort, breath sounds)   Assess for changes in cognitive function   Prevent/manage side effects per LIP orders (i.e. nausea,  vomiting, pruritus, constipation, urinary retention, etc.)   Evaluate for opioid-induced sedation with appropriate assessment tool (i.e. POSS)

## 2023-09-18 NOTE — Plan of Care (Signed)
 Pt a/o x 4, denies pain. Denies sob, resp distress, chest pain. On 3L NC, neb treatment administered as ordered. Pt standby assist oob, fall precautions maintained, frequent purposeful rounding performed. Repositioning in bed independently with no difficulty. Son at bedside assisting in pt care. Will continue to monitor.    Problem: Moderate/High Fall Risk Score >5  Goal: Patient will remain free of falls  Outcome: Progressing  Flowsheets (Taken 09/17/2023 2300)  Moderate Risk (6-13):   MOD-Consider activation of bed alarm if appropriate   MOD-Floor mat at bedside (where available) if appropriate   MOD-Remain with patient during toileting   MOD-Perform dangle, stand, walk (DSW) prior to mobilization   MOD-include family in multidisciplinary POC discussions     Problem: Compromised Sensory Perception  Goal: Sensory Perception Interventions  Outcome: Progressing  Flowsheets (Taken 09/17/2023 2000)  Sensory Perception Interventions: Offload heels, Pad bony prominences, Reposition q 2hrs/turn Clock, Q2 hour skin assessment under devices if present     Problem: Compromised Activity/Mobility  Goal: Activity/Mobility Interventions  Outcome: Progressing  Flowsheets (Taken 09/17/2023 2000)  Activity/Mobility Interventions: Pad bony prominences, TAP Seated positioning system when OOB, Promote PMP, Reposition q 2 hrs / turn clock, Offload heels     Problem: Inadequate Gas Exchange  Goal: Adequate oxygenation and improved ventilation  Outcome: Progressing  Flowsheets (Taken 09/15/2023 1119 by Frances Lavena KIDD, RN)  Adequate oxygenation and improved ventilation:   Assess lung sounds   Monitor SpO2 and treat as needed   Provide mechanical and oxygen support to facilitate gas exchange   Position for maximum ventilatory efficiency   Teach/reinforce use of incentive spirometer 10 times per hour while awake, cough and deep breath as needed   Plan activities to conserve energy: plan rest periods   Increase activity as  tolerated/progressive mobility   Consult/collaborate with Respiratory Therapy  Goal: Patent Airway maintained  Outcome: Progressing  Flowsheets (Taken 09/11/2023 2111 by Hyla Round, RN)  Patent airway maintained:   Position patient for maximum ventilatory efficiency   Provide adequate fluid intake to liquefy secretions   Suction secretions as needed   Reinforce use of ordered respiratory interventions (i.e. CPAP, BiPAP, Incentive Spirometer, Acapella, etc.)   Reposition patient every 2 hours and as needed unless able to self-reposition     Problem: Compromised Moisture  Goal: Moisture level Interventions  Outcome: Progressing  Flowsheets (Taken 09/17/2023 2000)  Moisture level Interventions: Moisture wicking products, Moisture barrier cream     Problem: Pain interferes with ability to perform ADL  Goal: Pain at adequate level as identified by patient  Outcome: Progressing  Flowsheets (Taken 09/17/2023 0017 by Thurnell Andy Dines, RN)  Pain at adequate level as identified by patient: Identify patient comfort function goal

## 2023-09-18 NOTE — Progress Notes (Addendum)
 MEDICINE PROGRESS NOTE    Date Time: 09/18/23 6:36 AM  Patient Name: Carl [name]  Attending Physician: Irl Avers, MD    Assessment / Plan:   83 year old male with past medical history significant for COPD, chronic emphysema, CAD, paroxysmal A-fib on warfarin, OSA, GERD, hypertension, hyperlipidemia, diabetes, presented to hospital on 1/5 with dyspnea, found to have hypoxic respiratory failure secondary to left lung pneumonia and left lung effusion    #Acute hypoxic respiratory failure,POA  - remains on 3 L of oxygen via nasal cannula.    -CTA chest Negative for pulmonary embolism however shows extensive left pleural nodularity pericardial thickening and enlarged left anterior cardiophrenic lymph node compatible with metastatic disease, persistent left pleural effusion slightly decreased and complete left lower lobe atelectasis and intermediate left adrenal nodule.  Echocardiogram shows concentric left ventricular remodeling, LVEF 73%.  - see below    # Metastatic adenocarcinoma of unclear primary, pleural fluid cytology came back positive for metastatic adenocarcinoma, primary source is currently unknown.    -Consulted oncology, ordered CT abdomen pelvis with and without contrast shows no potential primary tumor in abdomen or pelvis except for Small left adrenal gland nodule represents potential metastasis. MRI brain pending.   -d/w interventional pulmonology today, plan is for his bronchoscopy and pleurx placement tomorrow 1/14.  - Follow-up their recommendations.   General pulmonology recommendations appreciated.    #Left pleural effusion, underwent thoracentesis by [facility] pulmonology on 1/7, 1.3 L was drained, repeat chest x-ray showed improved but persistent effusion.  Repeat ultrasound reported worsening effusion but bedside ultrasound does not support this.   - follow up on CxR    #Left lower lobe pneumonia- improving, on cefepime - [place] today, received total of 7days. WBC 13.03k down  trending    #Mediastinal lymphadenopathy, cytology of pleural effusion- Malignant cells present, compatible with metastatic adenocarcinoma.     #COPD, not in exacerbation, remains on Breo Ellipta , DuoNebs every 6 hours, [place] pulmonology is following as well.  Received dupixent  300 mg on 1/7     #Paroxysmal A-fib, on warfarin as outpatient. Warfarin on Hold and currently on full dose lovenox  1mg /kg BID- to be held this evening and tomorrow for bronchoscopy.ral biopsy/Pleurx cath.   Cont with metoprolol      #Diabetes mellitus type 2, A1c 7.4, on glipizide  and metformin  at home--Continue with Lantus  and Humalog  sliding scale.    #HTN, CAD: not on ASA. Continue with  metoprolol , atorvastatin .Held home med Imdur  30, lisinopril , amlodipine  given borderline soft blood pressure.    #HFpEF, likely in mild exacerbation, PO Lasix  20 mg daily.    For DVT prophylaxis, on Lovenox   Code Status: Full code        Safety Checklist:     DVT prophylaxis:  CHEST guideline (See page [id number]) Chemical   Foley:   Rn Foley protocol Not present   IVs:  Peripheral IV   PT/OT: Ordered   Daily CBC & or Chem ordered:  SHM/ABIM guidelines (see #5) No   Reference for approximate charges of common labs: CBC auto diff - $76  BMP - $99  Mg - $79    Lines:     Patient Lines/Drains/Airways Status       Active PICC Line / CVC Line / PIV Line / Drain / Airway / Intraosseous Line / Epidural Line / ART Line / Line / Wound / Pressure Ulcer / NG/OG Tube       Name Placement date Placement time Site Days    Peripheral IV 09/10/23  22 G Diffusion Left Antecubital 09/10/23  1954  Antecubital  1                     Disposition: (Please see PAF column for Expected D/C Date)   Today's date: 09/18/2023  Admit Date: 09/10/2023  7:27 PM  LOS: 8  Clinical Milestones: copd, effusion   Anticipated discharge needs: tbd      Subjective     CC: Hypoxia    HPI/Subjective: 83 year old male with past medical history significant for COPD, chronic emphysema, CAD, paroxysmal  A-fib on warfarin, OSA, GERD, hypertension, hyperlipidemia, diabetes, presented to hospital on 1/5 with dyspnea  At presentation blood pressure was 130/74 heart rate 116 temperature 98.7 saturating 87% on room air  Blood work showed WBC of 15, hemoglobin of 15.7 platelet count 367  BMP was grossly unremarkable except blood glucose of 220, lactic acid of 3.2, BNP 1000 INR 2.9  Chest x-ray showed moderate left pleural effusion  Admitted to hospitalist service with acute hypoxic respiratory failure secondary to left-sided pneumonia and effusion, started on cefepime  and azithromycin   [place] pulmonology was consulted for possible thoracentesis    1/13. Continues to have SOB, does not feel any better. No chest pains. No fevers/chills.  Remains on 3 L NC        Review of Systems:     As per HPI    Physical Exam:     VITAL SIGNS PHYSICAL EXAM   Temp:  [97.3 F (36.3 C)-98.8 F (37.1 C)] 97.7 F (36.5 C)  Heart Rate:  [77-114] 77  Resp Rate:  [22-24] 24  BP: (106-145)/(66-80) 122/76  Blood Glucose:    Telemetry:       Intake/Output Summary (Last 24 hours) at 09/18/2023 0636  Last data filed at 09/17/2023 1830  Gross per 24 hour   Intake --   Output 400 ml   Net -400 ml      Physical Exam  General: Well developed, well nourished, obese elderly male, in no apparent distress  Head and Neck: Atraumatic, normocephalic, PERRL, extraocular movements intact, no scleral icterus, wearing nasal cannula  Cardiovascular: Irregularly irregular rate and rhythm, no murmurs  Lungs: Decreased breath sounds at the bases bilaterally- more on left side, no wheezing  Abdomen: Soft, non-tender, non-distended  Extremities: No edema         Meds:     Medications were reviewed:  Current Facility-Administered Medications   Medication Dose Route Frequency    albuterol -ipratropium  3 mL Nebulization Q6H WA    allopurinol   300 mg Oral Daily    atorvastatin   40 mg Oral Daily    cefepime   2 g Intravenous Q12H    enoxaparin   1 mg/kg Subcutaneous Q12H     fluticasone  furoate-vilanterol  1 puff Inhalation QAM    furosemide   20 mg Oral Daily    insulin  glargine  10 Units Subcutaneous QAM    insulin  lispro  1-5 Units Subcutaneous TID AC    metoprolol  succinate  100 mg Oral Q12H    senna-docusate  1 tablet Oral QHS     Infusion Meds[1]  PRN Medications[2]      Labs:     Labs (last 72 hours):    Recent Labs   Lab 09/18/23  0408 09/17/23  0353   WBC 13.03* 14.79*   Hemoglobin 13.7 13.3   Hematocrit 41.9 41.7   Platelet Count 290 329       Recent Labs   Lab 09/16/23  9682 09/15/23  1721   PT 17.1* 16.1*   INR 1.5 1.4   PTT  --  33    Recent Labs   Lab 09/16/23  0317 09/15/23  1721   Sodium 139 138   Potassium 3.7 4.1   Chloride 103 99   CO2 26 28   BUN 23 23   Creatinine 0.9 0.9   Calcium  10.3* 10.3*   Glucose 183* 250*                   Microbiology, reviewed and are significant for:  Microbiology Results (last 15 days)       Procedure Component Value Units Date/Time    Culture and Gram Stain, Aerobic and Anaerobic Bacteria, Wound/Tissue/Fluid (Order) [[id number]] Collected: 09/12/23 1131    Order Status: Completed Specimen: Pleural Fluid from Pleural Cavity, Left Updated: 09/16/23 1433    Narrative:      The following orders were created for panel order Culture and Gram Stain, Aerobic and Anaerobic Bacteria, Wound/Tissue/Fluid (Order).  Procedure                               Abnormality         Status                     ---------                               -----------         ------                     Culture And Gram Stain,.SABRASABRA[[id number]]                      Final result                 Please view results for these tests on the individual orders.    Culture And Gram Stain, Aerobic Bacteria, Wound/Tissue/Fluid [[id number]] Collected: 09/12/23 1131    Order Status: Completed Specimen: Pleural Fluid from Pleural Cavity, Left Updated: 09/16/23 1433     Culture Aerobic Bacteria No growth     Gram Stain Many WBCs      No organisms seen    Narrative:      Stain  performed on cytospin (concentrated) specimen.     Urine Streptococcus pneumoniae Antigen [[id number]]  (Normal) Collected: 09/11/23 0602    Order Status: Completed Specimen: Urine, Clean Catch Updated: 09/11/23 2055     Urine Streptococcus pneumoniae Antigen Negative     Comment: This is a presumptive test for the direct qualitative detection of bacterial antigen. This test is not intended as a substitute for a gram stain and bacterial culture. Samples with extremely low levels of antigen may yield negative results.       Urine Legionella pneumophila serogroup 1 Antigen [[id number]]  (Normal) Collected: 09/11/23 0602    Order Status: Completed Specimen: Urine, Clean Catch Updated: 09/11/23 2053     Urine Legionella pneumophila serogroup 1 Antigen Negative     Comment: This test will not detect infections caused by other Legionella pneumophila serogroups or by other Legionella species. A negative antigen result does not exclude infection with Legionella pneumophila serogroup 1.       Nares, MRSA (methicillin-resistant Staphylococcus aureus) Screening, PCR [[id number]]  (Normal) Collected: 09/11/23 0143  Order Status: Completed Specimen: Swab from Nares Updated: 09/11/23 0426     MRSA (methicillin resistant Staphylococcus aureus) DNA Not Detected     Comment: Testing performed with the Xpert MRSA NxG assay. This test is intended for the detection of methicillin-resistant Staphylococcus aureus (MRSA) DNA. This test is not intended to diagnose, guide, or monitor treatment for MRSA infections, or provide results of susceptibility to methicillin. A positive test result does not necessarily indicate the presence of viable organisms. A negative result does not preclude MRSA nasal colonization.       Culture, Sputum and Lower Respiratory [[id number]]     Order Status: Sent Specimen: Sputum, Expectorated     COVID-19 and Influenza ([place]) (symptomatic) [[id number]]  (Normal) Collected: 09/10/23 1946    Order Status:  Completed Specimen: Swab from Anterior Nares Updated: 09/10/23 2019     SARS-CoV-2 (COVID-19) RNA Not Detected     Influenza A RNA Not Detected     Influenza B RNA Not Detected    Narrative:      A result of Detected indicates POSITIVE for the presence of viral RNA  A result of Not Detected indicates NEGATIVE for the presence of viral RNA    Test performed using the Roche cobas [place] SARS-CoV-2 & Influenza A/B assay. This is a multiplex real-time RT-PCR assay for the detection of SARS-CoV-2, influenza A, and influenza B virus RNA. Viral nucleic acids may persist in vivo, independent of viability. Detection of viral nucleic acid does not imply the presence of infectious virus, or that virus nucleic acid is the cause of clinical symptoms. Negative results do not preclude SARS-CoV-2, influenza A, and/or influenza B infection and should not be used as the sole basis for diagnosis, treatment or other patient management decisions. Invalid results may be due to inhibiting substances in the specimen and recollection should occur.     Culture, Blood, Aerobic And Anaerobic [[id number]] Collected: 09/10/23 1946    Order Status: Completed Specimen: Blood, Venous Updated: 09/15/23 2300     Culture Blood No growth at 5 days                Signed by: Abbe Meuse, MD        This note was generated within the [employer] EMR using Dragon medical speech recognition software and may contain inherent errors or omissions not intended by the user. Grammatical and punctuation errors, random word insertions, deletions, pronoun errors and incomplete sentences are occasional consequences of this technology due to software limitations. Not all errors are caught or corrected. Although every attempt is made to root out erroneus and incomplete transcription, the note may still not fully represent the intent or opinion of the author. If there are questions or concerns about the content of this note or information contained within the body of this  dictation they should be addressed directly with the author for clarification. '         [1]   Current Facility-Administered Medications   Medication Dose Route Frequency Last Rate   [2]   Current Facility-Administered Medications   Medication Dose Route    acetaminophen   650 mg Oral    benzocaine -menthol   1 lozenge Buccal    benzonatate   100 mg Oral    calcium  carbonate  500 mg Oral    carboxymethylcellulose sodium  1 drop Both Eyes    dextrose   15 g of glucose Oral    Or    dextrose   12.5 g Intravenous    Or  dextrose   12.5 g Intravenous    Or    glucagon  (rDNA)  1 mg Intramuscular    dextrose   15 g of glucose Oral    Or    dextrose   12.5 g Intravenous    Or    dextrose   12.5 g Intravenous    Or    glucagon  (rDNA)  1 mg Intramuscular    melatonin  3 mg Oral    naloxone   0.2 mg Intravenous    potassium & sodium phosphates   2 packet Oral    potassium chloride   0-60 mEq Oral    Or    potassium chloride   0-60 mEq Oral    Or    potassium chloride   10 mEq Intravenous    saline  2 spray Each Nare

## 2023-09-18 NOTE — Progress Notes (Signed)
 CASE MANAGEMENT PROGRESS NOTE:    Patient: Carl [name], Carl [name] (83 y.o., male)  Admission Date: 09/10/2023  Hospital Day: 8  Admission Diagnosis:Hypoxia [R09.02]  Pneumonia of left lower lobe due to infectious organism [J18.9]  Sepsis, due to unspecified organism, unspecified whether acute organ dysfunction present [A41.9]      Summary of Discharge Plan:  TBD, likely home with HH and OP f/u    Identified Possible Discharge Barriers:  MRI ordered  O2 - will need to redo ambulatory pulse ox testing   [facility]      CM Interventions and Outcome:  CM/RN/MD bedside rounds completed. [place] pending barriers noted above, oncology following . Patient requiring home 02 need amb sat testing within 48 hours [place], will place template again for RN to complete.       Discussed above Discharge Plan with (patient, family, Care Team, others):  Care team, patient, family    CM will continue to identify and address  [place] planning needs and any potential barriers.      Abigail Monks, BSN, RN  RN Case Manager 1  Case Management  [employer]  [phone]  [email or web address]

## 2023-09-18 NOTE — Nursing Progress Note (Signed)
 PULSE OXIMETRY TESTING:      Document patient's oxygen at rest :   ___95__% on RA at rest (No ranges please).  IF 88% OR BELOW provide additional O2 and see next step,     __96___% on oxygen, at rest on__2___LPM via NC (document number of liters needed to maintain 89% or greater)         Next, test patient with ambulation (or exertion in bed IF bed bound) and document below:    ___94__% on RA with exertion (if 88% or below, provide additional O2 and see next step)    __94___% on oxygen, with exertion, at___2__LPM via NC (document number of liters needed to maintain 89% or greater)

## 2023-09-19 ENCOUNTER — Inpatient Hospital Stay: Payer: Medicare Other

## 2023-09-19 ENCOUNTER — Inpatient Hospital Stay: Payer: Medicare Other | Admitting: Anesthesiology

## 2023-09-19 ENCOUNTER — Encounter
Admission: EM | Disposition: A | Discharge status: Home Health | Payer: Self-pay | Source: Home / Self Care | Attending: Residents

## 2023-09-19 ENCOUNTER — Encounter: Payer: Self-pay | Admitting: Internal Medicine

## 2023-09-19 HISTORY — PX: PLACEMENT, INDWELLING CATHETER PLEURAL: SHX00063

## 2023-09-19 HISTORY — PX: ULTRASOUND, ENDOBRONCHIAL (EBUS): SHX8010

## 2023-09-19 LAB — LAB USE ONLY - CBC WITH DIFFERENTIAL
Absolute Basophils: 0.04 10*3/uL (ref 0.00–0.08)
Absolute Eosinophils: 0.03 10*3/uL (ref 0.00–0.44)
Absolute Immature Granulocytes: 0.07 10*3/uL (ref 0.00–0.07)
Absolute Lymphocytes: 1.41 10*3/uL (ref 0.42–3.22)
Absolute Monocytes: 1.42 10*3/uL — ABNORMAL HIGH (ref 0.21–0.85)
Absolute Neutrophils: 9.72 10*3/uL — ABNORMAL HIGH (ref 1.10–6.33)
Absolute nRBC: 0 10*3/uL (ref <=0.00)
Basophils %: 0.3 %
Eosinophils %: 0.2 %
Hematocrit: 43.3 % (ref 37.6–49.6)
Hemoglobin: 14.1 g/dL (ref 12.5–17.1)
Immature Granulocytes %: 0.6 %
Lymphocytes %: 11.1 %
MCH: 30.1 pg (ref 25.1–33.5)
MCHC: 32.6 g/dL (ref 31.5–35.8)
MCV: 92.3 fL (ref 78.0–96.0)
MPV: 12 fL (ref 8.9–12.5)
Monocytes %: 11.2 %
Neutrophils %: 76.6 %
Platelet Count: 327 10*3/uL (ref 142–346)
Preliminary Absolute Neutrophil Count: 9.72 10*3/uL — ABNORMAL HIGH (ref 1.10–6.33)
RBC: 4.69 10*6/uL (ref 4.20–5.90)
RDW: 14 % (ref 11–15)
WBC: 12.69 10*3/uL — ABNORMAL HIGH (ref 3.10–9.50)
nRBC %: 0 /100{WBCs} (ref <=0.0)

## 2023-09-19 LAB — BASIC METABOLIC PANEL
Anion Gap: 8 (ref 5.0–15.0)
BUN: 25 mg/dL (ref 9–28)
CO2: 27 meq/L (ref 17–29)
Calcium: 10 mg/dL (ref 7.9–10.2)
Chloride: 103 meq/L (ref 99–111)
Creatinine: 0.8 mg/dL (ref 0.5–1.5)
GFR: >60 mL/min/{1.73_m2} (ref >=60.0)
Glucose: 169 mg/dL — ABNORMAL HIGH (ref 70–100)
Potassium: 3.8 meq/L (ref 3.5–5.3)
Sodium: 138 meq/L (ref 135–145)

## 2023-09-19 LAB — WHOLE BLOOD GLUCOSE POCT
Whole Blood Glucose POCT: 170 mg/dL — ABNORMAL HIGH (ref 70–100)
Whole Blood Glucose POCT: 165 mg/dL — ABNORMAL HIGH (ref 70–100)
Whole Blood Glucose POCT: 158 mg/dL — ABNORMAL HIGH (ref 70–100)
Whole Blood Glucose POCT: 234 mg/dL — ABNORMAL HIGH (ref 70–100)

## 2023-09-19 LAB — MAGNESIUM: Magnesium: 2 mg/dL (ref 1.6–2.6)

## 2023-09-19 SURGERY — ULTRASOUND, ENDOBRONCHIAL (EBUS)
Anesthesia: Anesthesia General | Site: Chest

## 2023-09-19 MED ORDER — PROPOFOL INFUSION 10 MG/ML
INTRAVENOUS | Status: DC | PRN
Start: 2023-09-19 — End: 2023-12-07
  Administered 2023-09-19: 120 ug/kg/min via INTRAVENOUS

## 2023-09-19 MED ORDER — ZOLPIDEM TARTRATE 5 MG PO TABS
5.0000 mg | ORAL_TABLET | Freq: Every evening | ORAL | Status: DC | PRN
Start: 2023-09-19 — End: 2023-09-20

## 2023-09-19 MED ORDER — SODIUM CHLORIDE 0.9 % IV SOLN
INTRAVENOUS | Status: DC | PRN
Start: 2023-09-19 — End: 2023-12-07

## 2023-09-19 MED ORDER — PROPOFOL 10 MG/ML IV EMUL (WRAP)
INTRAVENOUS | Status: DC | PRN
Start: 2023-09-19 — End: 2023-12-07
  Administered 2023-09-19: 50 mg via INTRAVENOUS
  Administered 2023-09-19: 20 mg via INTRAVENOUS
  Administered 2023-09-19: 120 mg via INTRAVENOUS

## 2023-09-19 MED ORDER — LIDOCAINE HCL 1 % IJ SOLN
INTRAMUSCULAR | Status: DC | PRN
Start: 2023-09-19 — End: 2023-09-19
  Administered 2023-09-19: 20 mL

## 2023-09-19 MED ORDER — PHENYLEPHRINE 10 MCG/ML IN NACL 0.9% IV SOSY
PREFILLED_SYRINGE | INTRAVENOUS | Status: DC | PRN
Start: 2023-09-19 — End: 2023-12-07
  Administered 2023-09-19: 100 ug via INTRAVENOUS
  Administered 2023-09-19: 150 ug via INTRAVENOUS

## 2023-09-19 SURGICAL SUPPLY — 39 items
ADAPTER CIRCUIT ANGLE SWIVEL 2 AXIS OD15 (Adapter) ×2 IMPLANT
ADAPTER CIRCUIT ANGLE SWIVEL 2 AXIS OD15 MM PEEP-KEEP (Adapter) ×2 IMPLANT
AIRWAY OROPHARYNGEAL L9 CM ADULT 1 PIECE (Respiratory Supplies) ×2 IMPLANT
AIRWAY OROPHARYNGEAL L9 CM ADULT 1 PIECE BITE BLOCK WILLIAMS (Respiratory Supplies) ×2 IMPLANT
BALLOON ENDOSCOPIC STERILE LATEX BF-UC160F (Balloon) ×2 IMPLANT
BALLOON ESCP LTX STRL BF-UC160F (Balloon) ×2 IMPLANT
BLANKET WARMING L36 IN X W33 IN BAIR (Patient Supply) ×2 IMPLANT
BLANKET WARMING L36 IN X W33 IN BAIR HUGGER POLYMER PEDIATRIC L24 IN X (Patient Supply) ×2 IMPLANT
CANNISTER SUCT W LID (Suction) ×4 IMPLANT
CONTAINER SPECIMEN 4 OZ OR POSITIVE (Procedure Accessories) ×10 IMPLANT
CONTAINER SPECIMEN 4 OZ OR POSITIVE INDICATOR TAMPER EVIDENT LEAK (Procedure Accessories) ×10 IMPLANT
DETERGENT CLEANING ENZYMATIC PREDILUTE (Kits) ×4 IMPLANT
DETERGENT CLEANING ENZYMATIC PREDILUTE SPONGE 500 ML ENDOSCOPE (Kits) ×4 IMPLANT
GLOVE SURGICAL 7.5 BIOGEL PI POWDER FREE (Glove) ×2 IMPLANT
GLOVE SURGICAL 7.5 BIOGEL PI POWDER FREE MICRO ROUGHENED BEAD CUFF (Glove) ×2 IMPLANT
LINER INSTRUMENT TRAY ABSORBENT TEAR RESISTANT WHITE 20X25 48139 (Procedure Accessories) ×2 IMPLANT
LINER TRAY ABSORBENT 20X25IN (Procedure Accessories) ×2 IMPLANT
LUBRICANT INSTRUMENT KIT SEAL DROPPER (Procedure Accessories) ×2 IMPLANT
LUBRICANT INSTRUMENT KIT SEAL DROPPER BOTTLE TOP ENDO-LUBE 6 GM (Procedure Accessories) ×2 IMPLANT
NEEDLE ASPIRATION VIZISHOT OD22 GA (Needles) ×2 IMPLANT
NEEDLE ASPIRATION VIZISHOT OD22 GA ODSEC1.8 MM L700 MM ECHOGENIC (Needles) ×2 IMPLANT
SOLUTION IRRIGATION 0.9% SODIUM CHLORIDE (IV Solutions) ×2 IMPLANT
SOLUTION IRRIGATION 0.9% SODIUM CHLORIDE 250 ML PLASTIC POUR BOTTLE (IV Solutions) ×2 IMPLANT
STOPCOCK IV 500 PSI 3 WAY ROTATING LOCK (Tubing) ×2 IMPLANT
STOPCOCK IV 500 PSI 3 WAY ROTATING LOCK MALE ADAPTER MARQUISÂ® (Tubing) ×2 IMPLANT
SYRINGE 10 ML LUER LOCK MEDLINE MEDICAL (Needles) ×2 IMPLANT
SYRINGE 5 ML LUER LOCK TIP MEDLINE (Syringes, Needles) ×4 IMPLANT
SYRINGE MED 30ML BD LF STRL ST GRAD (Syringes, Needles) ×2 IMPLANT
TRAP MUCUS SCREW CAP TUBE ID LABEL (Procedure Accessories) ×2 IMPLANT
TRAP MUCUS SCREW CAP TUBE ID LABEL MEDLINE PLASTIC CLEAR (Procedure Accessories) ×2 IMPLANT
TUBE CONNECTING L18.75 IN SUCTION (Tubing) ×2 IMPLANT
TUBE CONNECTING L18.75 IN SUCTION CANISTER ID.25 IN HI-FLOW BLUE (Tubing) ×2 IMPLANT
TUBING SUCTION OD3/16 IN L12 FT FEMALE (Tubing) ×2 IMPLANT
TUBING SUCTION OD3/16 IN L12 FT FEMALE CONNECTOR RIBBED UNIVERSAL (Tubing) ×2 IMPLANT
VALVE BIOPSY SINGLE USE BIOPSY VALVE STERILE ULTRASONIC BRONCHOSCOPE (Disposable Instruments) ×2 IMPLANT
VALVE BIOPSY ULTRASONIC BRONCHOSCOPE (Disposable Instruments) ×2 IMPLANT
VALVE SUCTION FLEXIBLE ATTACH (Disposable Instruments) ×2 IMPLANT
VALVE SUCTION SINGLE USE SUCTION VALVE STERILE FLEXIBLE ATTACH (Disposable Instruments) ×2 IMPLANT
WATER STERILE PLASTIC POUR BOTTLE 250 ML (Irrigation Solutions) ×4 IMPLANT

## 2023-09-19 NOTE — Nursing Progress Note (Signed)
 Patient transported to PACU via stretcher bed  Patient transferred on 2L of O2 via nasal canula   RAC 22g PIV intact.   Son accompanied during transport.

## 2023-09-19 NOTE — Plan of Care (Signed)
 Problem: Moderate/High Fall Risk Score >5  Goal: Patient will remain free of falls  Outcome: Progressing  Flowsheets (Taken 09/18/2023 1000)  Moderate Risk (6-13):   MOD-Consider activation of bed alarm if appropriate   MOD-Floor mat at bedside (where available) if appropriate   MOD-Perform dangle, stand, walk (DSW) prior to mobilization   MOD-Request PT/OT consult order for patients with gait/mobility impairment     Problem: Inadequate Gas Exchange  Goal: Adequate oxygenation and improved ventilation  Outcome: Progressing  Flowsheets (Taken 09/19/2023 1443)  Adequate oxygenation and improved ventilation:   Assess lung sounds   Monitor SpO2 and treat as needed   Monitor and treat ETCO2   Provide mechanical and oxygen support to facilitate gas exchange   Plan activities to conserve energy: plan rest periods   Increase activity as tolerated/progressive mobility   Teach/reinforce use of incentive spirometer 10 times per hour while awake, cough and deep breath as needed   Consult/collaborate with Respiratory Therapy     Problem: Pain interferes with ability to perform ADL  Goal: Pain at adequate level as identified by patient  Outcome: Progressing  Flowsheets (Taken 09/18/2023 1826)  Pain at adequate level as identified by patient:   Identify patient comfort function goal   Assess for risk of opioid induced respiratory depression, including snoring/sleep apnea. Alert healthcare team of risk factors identified.   Assess pain on admission, during daily assessment and/or before any as needed intervention(s)   Reassess pain within 30-60 minutes of any procedure/intervention, per Pain Assessment, Intervention, Reassessment (AIR) Cycle   Evaluate if patient comfort function goal is met   Evaluate patient's satisfaction with pain management progress   Offer non-pharmacological pain management interventions   Consult/collaborate with Pain Service   Consult/collaborate with Physical Therapy, Occupational Therapy, and/or Speech  Therapy   Include patient/patient care companion in decisions related to pain management as needed     Problem: Side Effects from Pain Analgesia  Goal: Patient will experience minimal side effects of analgesic therapy  Outcome: Progressing  Flowsheets (Taken 09/18/2023 1826)  Patient will experience minimal side effects of analgesic therapy:   Monitor/assess patient's respiratory status (RR depth, effort, breath sounds)   Assess for changes in cognitive function   Prevent/manage side effects per LIP orders (i.e. nausea, vomiting, pruritus, constipation, urinary retention, etc.)   Evaluate for opioid-induced sedation with appropriate assessment tool (i.e. POSS)

## 2023-09-19 NOTE — Anesthesia Preprocedure Evaluation (Signed)
 Anesthesia Evaluation    AIRWAY    Mallampati: III    TM distance: >3 FB  Neck ROM: limited  Mouth Opening:full   CARDIOVASCULAR    irregular and tachycardic       DENTAL          Dental Comments: Edentolous          PULMONARY    decreased breath sounds and LLF     OTHER FINDINGS                                      83 year old male with past medical history of COPD, emphysema, CAD, A-fib on Coumadin , OSA who is hospitalized in the setting of dyspnea on exertion and acute hypoxic respiratory failure.  Initially presumed to be pneumonia however imaging revealed left-sided pleural effusion and pleural-based nodules/hilar mass.  Patient underwent thoracentesis which was exudative and cytology was positive for adenocarcinoma however staining unable to determine primary source of cancer.  Interventional pulmonology consulted for obtaining additional tissue to establish diagnosis.     TTE Summary 09/2023    * Technically difficult echocardiogram due to poor acoustic windows.    * Contrast was administered to enable adequate wall motion interpretation.    * Left ventricular systolic function is hyperdynamic with an ejection  fraction by Biplane Method of Discs of  73 %.    * Left ventricular segmental wall motion is grossly normal, but subtle wall  motion abnormalities cannot be excluded as technically limited study.    * Left ventricular diastolic function could not be assessed as this was a  technically limited study.    * Mildly decreased right ventricular systolic function.    * Evaluation of fibrosclerosis vs stenosis of the aortic valve cannot be  determined due to technical inability to capture the maximum Doppler  transaortic velocity.    * No pulmonary hypertension with estimated right ventricular systolic  pressure of  32 mmHg.  Relevant Problems   ANESTHESIA   (+) OSA (obstructive sleep apnea)      PULMONARY   (+) OSA (obstructive sleep apnea)      CARDIO   (+) Arteriosclerosis of coronary artery   (+) Atrial  flutter   (+) Chronic systolic congestive heart failure   (+) Hypertension associated with type 2 diabetes mellitus      ENDO   (+) Type 2 diabetes mellitus with other specified complication      OTHER   (+) Arthritis of both knees   (+) Bilateral hip joint arthritis               Anesthesia Plan    ASA 4     general               (Discussed risks and benefits of general anesthesia. Common side effects including PONV, sore throat and shivering. Less commonly injury to mouth and eyes, allergic reactions, cardiovascular or pulmonary conditions including MI and stroke.  )      intravenous induction   Detailed anesthesia plan: general LMA        Post op pain management: per surgeon and PO analgesics        Plan discussed with CRNA.                     Signed by: Kathrine CHRISTELLA Casandra Ali, MD 09/19/23 8:18 AM

## 2023-09-19 NOTE — Progress Notes (Signed)
 Left side pleurx placed without any complications. 500 ml amber colored fluid was drained. Vital signs stable and x-ray has been released.

## 2023-09-19 NOTE — Procedures (Signed)
 Interventional Pulmonology   Procedure Note     Patient: Carl [name]  MRN: [id number]   Date of Service: 09/19/2023     Indication for procedure: Malignant pleural effusion     Procedure performed: Insertion of indwelling pleural catheter with image guidance, PleurX catheter insertion     Side of procedure:  Left side    Sedation/Anesthesia:  TIVA and local anesthesia     Procedure Description:  Anesthesia induction was performed by anesthesiology staff with the use of TIVA after pre-induction timeout. No artificial airway was required for this procedure.  Please refer to anesthesia documentation for specifics on drugs administered and dosages.  Patient was then turned over to our staff.      A time out was performed immediately prior to the start of the procedure to confirm correct patient, procedure, and laterality.  An ultrasound examination was performed of the chest/back to identify the pleural effusion, to identify the best site of entry for the procedure, and to mark the site of entry.  Sterile precautions were utilized for this procedure.  A PleurX tray from [employer] was selected.     The site of entry was sterilized with chloraprep solution.  Full body draping was applied, with site of entry exposed.  Local anesthesia was accomplished by injecting 15mL of lidocaine  1% solution into the skin/subcutaneous structures with a 25 gauge (1 inch) needle and then with a 22 gauge (1.5 inch) needle.  The catheter track and exit site was also anesthetized at this time.  At the entry site, the needle was then advanced (while aspirating) until the pleural cavity was reached and pleural fluid was visible in the syringe.  Next, a safety scalpel was used to make a small incision into the skin at both entry and exit sites.  A guidewire introducer with needle was inserted into the entry site incision (while aspirating).  Once pleural fluid was seen in the syringe, introducer was advanced over the needle.  The  needle was then removed, and a J-tip guidewire was then inserted through the introducer.  Next the PleurX catheter was attached to the tunneler and tunneled under the skin from the exit site incision to the entry site incision.  Once through, the catheter was pulled until the catheter cuff had passed beneath the skin.  A 12 Fr dilator was advanced over the guide wire to dilate entry site tissue and then removed.  Then a 16 Fr peel-away introducer was advanced over the guidewire into the pleural space.  The inner stylet and guidewire were then removed and my finger placed over the introducer to minimize leakage of fluid and prevent entry of air into the pleural cavity.  The PleurX catheter was then inserted into the introducer, while simultaneously peeling away the proximal end of the introducer until it completed peeled apart and was removed from the skin.  The catheter was advanced completely and palpated to ensure there were no kinks in the subcutaneous entry site region.  Catheter was connected via included adapter to suction container/pleurovac to confirm drainage.  Catheter was then disconnected from the adapter and catheter cap screwed on. Finally, 2-0 silk suture was used for interrupted stitches at the entry and exit site incisions.  Skin was cleaned with chloraprep solution and  wet-to-dry gauze.  PleurX catheter was then dressed appropriately to the side of the chest with included foam pad, gauze, and self-adhesive dressing.    Complications/Comments:  None     Findings:  Successful  placement of PleurX catheter on left side.  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  [place], [place] [address]   T [phone]  F [phone]   [email or web address]

## 2023-09-19 NOTE — Plan of Care (Signed)
 Problem: Compromised Activity/Mobility  Goal: Activity/Mobility Interventions  Outcome: Progressing     Problem: Inadequate Gas Exchange  Goal: Adequate oxygenation and improved ventilation  Outcome: Progressing  Flowsheets (Taken 09/19/2023 0207)  Adequate oxygenation and improved ventilation:   Assess lung sounds   Monitor SpO2 and treat as needed   Consult/collaborate with Respiratory Therapy   Teach/reinforce use of incentive spirometer 10 times per hour while awake, cough and deep breath as needed   Plan activities to conserve energy: plan rest periods  Goal: Patent Airway maintained  Outcome: Progressing  Flowsheets (Taken 09/11/2023 2111 by Hyla Round, RN)  Patent airway maintained:   Position patient for maximum ventilatory efficiency   Provide adequate fluid intake to liquefy secretions   Suction secretions as needed   Reinforce use of ordered respiratory interventions (i.e. CPAP, BiPAP, Incentive Spirometer, Acapella, etc.)   Reposition patient every 2 hours and as needed unless able to self-reposition     Problem: Pain interferes with ability to perform ADL  Goal: Pain at adequate level as identified by patient  Outcome: Progressing  Flowsheets (Taken 09/18/2023 1826 by Casandra Bossier, RN)  Pain at adequate level as identified by patient:   Identify patient comfort function goal   Assess for risk of opioid induced respiratory depression, including snoring/sleep apnea. Alert healthcare team of risk factors identified.   Assess pain on admission, during daily assessment and/or before any as needed intervention(s)   Reassess pain within 30-60 minutes of any procedure/intervention, per Pain Assessment, Intervention, Reassessment (AIR) Cycle   Evaluate if patient comfort function goal is met   Evaluate patient's satisfaction with pain management progress   Offer non-pharmacological pain management interventions   Consult/collaborate with Pain Service   Consult/collaborate with Physical Therapy,  Occupational Therapy, and/or Speech Therapy   Include patient/patient care companion in decisions related to pain management as needed     Problem: Side Effects from Pain Analgesia  Goal: Patient will experience minimal side effects of analgesic therapy  Outcome: Progressing  Flowsheets (Taken 09/18/2023 1826 by Casandra Bossier, RN)  Patient will experience minimal side effects of analgesic therapy:   Monitor/assess patient's respiratory status (RR depth, effort, breath sounds)   Assess for changes in cognitive function   Prevent/manage side effects per LIP orders (i.e. nausea, vomiting, pruritus, constipation, urinary retention, etc.)   Evaluate for opioid-induced sedation with appropriate assessment tool (i.e. POSS)

## 2023-09-19 NOTE — Progress Notes (Addendum)
 Interventional Pulmonology Daily Progress Note  Team Spectra : [id number]          Assessment:    83 y.o. male with pmhx of extensive smoking, coronary artery disease, A-fib on therapeutic anticoagulation, recent diagnosis of metastatic adenocarcinoma of unclear primary for whom interventional pulmonology is consulted for obtaining additional tissue to confirm diagnostic origin as well as management of recurrent malignant pleural effusion.     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 Upon review of his CT scan from June 2024 this was not present previously and hence has arise in the last 6 months.   Plan:   - Bronchoscopy for EBUS and pleurx placement today. NPO, Lovenox  o hold.   - Will need HH set up for nursing support for drainage of pleurx catheter three times/week.  - Remainder of care per primary team    Case reviewed with Drs. Zurri Rudden/Duong who are in agreement with above        Signed by: Julianne C Hadesty, PA  Thoracic Surgery/Interventional Pulmonology     _____________________  IP Attending Attestation:    As the attending physician, I certify that I have personally seen and examined the patient with the APP/Fellow. I performed the substantive portion of this visit by personally conducting the Medical DecisionMaking component, in its entirety. I have reviewed and confirmed the review of systems, relevant histories, vitals, laboratory data, radiological imaging and assessment/plan as documented and agree with additional comments below.     Will plan for EBUS TBNA and possible left sided pleurx placement pending POCUS.      Jinnie Fairly, MD  Interventional Pulmonology  [facility]  [facility]  [address]  [place], [place] [address]   T [phone]  F [phone]   [email or web address]                Interval History:   24 hour Events: No  events over night    Subjective: No complaints. Reviewed recommendations and indications with patient/son.  Eager to move forward and get home.     Physical Exam:   Current Vitals:   BP 116/66   Pulse 97   Temp 97.3 F (36.3 C) (Oral)   Resp 19   Ht 1.6 m (5' 3)   Wt 82.1 kg (180 lb 14.4 oz)   SpO2 95%   BMI 32.04 kg/m       GEN:  A&O x3, NAD   COR: RRR, no m/r/g  LUNGS: Good respiratory effort on 2L NC  ABD: soft, nttp, non-distended  EXTREMITIES: warm, no edema    Lines/Drains:   Chest Tube: None     Laboratory and Radiological Results:     Lab Results   Component Value Date    WBC 12.69 (H) 09/19/2023    HGB 14.1 09/19/2023    HCT 43.3 09/19/2023    MCV 92.3 09/19/2023    PLT 327 09/19/2023     Lab Results   Component Value Date    NA 138 09/19/2023    K 3.8 09/19/2023    CL 103 09/19/2023    CO2 27 09/19/2023    BUN 25 09/19/2023    CREAT 0.8 09/19/2023    MG 2.0 09/19/2023         No results found.

## 2023-09-19 NOTE — Nursing Progress Note (Addendum)
 Brief background: 83 year old male with past medical history significant for COPD, chronic emphysema, CAD, paroxysmal A-fib on warfarin, OSA, GERD, hypertension, hyperlipidemia, diabetes, presented to hospital on 1/5 with dyspnea, found to have hypoxic respiratory failure secondary to left lung pneumonia and left lung effusion     Last 24 Hrs (any critical events):   - potassium replaced  - Bronchoscopy and PleurX placement complete, site assessed, no drainage noted, dressing clean, dry and intact, removed   - report from GI PACU RN Franky   - PRN PO tylenol  for pain mgmt   - Safety precautions in place, patient educated he will be x1 assist for 12 hours for patient safety s/p anesthesia, patient verbalized understanding. Call light within reach, bed alarm on, purposeful rounding maintained       LDA: right AC 22g    Abnormal labs/VS (needing any orders): vitals stable     Neuro changes: A/O x4    Respiratory (changes in oxygenation): 2L NC    GI/GU (changes in output, BM, foley): LBM 1/13, voids freely     Nutrition: regular diet    Activity/ Safety/ Mobility  Questions (need for PT/OT, sitter): x1 assist s/p procedure     Tele/ Rhythm: no tele    Skin/ Wounds: Scattered bruising, s/p Left lateral chest pleurx drain placement     VTE prophylaxis:     Upcoming Procedures: n/a    Case management needs: medical clearance, pending pleurx education, O2 eval    Discharge plan: home with [facility], outpatient oncology f/u

## 2023-09-19 NOTE — Progress Notes (Signed)
 CASE MANAGEMENT PROGRESS NOTE:    Patient: Carl [name], Carl [name] 83 y.o., male)  Admission Date: 09/10/2023  Hospital Day: 9  Admission Diagnosis:Hypoxia [R09.02]  Pneumonia of left lower lobe due to infectious organism [J18.9]  Sepsis, due to unspecified organism, unspecified whether acute organ dysfunction present [A41.9]      Summary of Discharge Plan:  Home with [facility] and OP f/u Oncology    Identified Possible Discharge Barriers:  [facility] HH set up already, NOW NEED PLEURX EDUCATION AND ORDER FORM  Home 02- NOT NEEDED, amb sat testing 09/18/23 does not qualify   Pleural effusion, Pleurx cath being placed today      CM Interventions and Outcome:  CM/ chart review, patient is having Pleurx Cath placed today. Will need to add to [facility] orders for [facility] and complete order form for BD, CM printed copy and placed in patient's chart. Alerted [facility] team of new DME need.     Discussed above Discharge Plan with (patient, family, Care Team, others):  Care team    CM will continue to identify and address  [place] planning needs and any potential barriers.      Abigail Monks, BSN, RN  RN Case Manager 1  Case Management  [employer]  [phone]  [email or web address]

## 2023-09-19 NOTE — Nursing Progress Note (Addendum)
 Brief background: 83 year old male with past medical history significant for COPD, chronic emphysema, CAD, paroxysmal A-fib on warfarin, OSA, GERD, hypertension, hyperlipidemia, diabetes, presented to hospital on 1/5 with dyspnea, found to have hypoxic respiratory failure secondary to left lung pneumonia and left lung effusion      Thoracentesis 1/7 1.2L removed and sample sent. Cytology came back which showed carcinoma   Pulmo and Oncology updated patient's son regarding results.  D- dimer elevated. CTA neg for PE  Hyperglycemia - commenced on Lantus  and sliding scale.  On IV Lasix   Coumadin  stopped,Lovenox  started 1/10  Metformin  on hold due to pending scans  MRI brain done 1/12 - No evidence of intracranial metastases.     Last 24 Hrs (any critical events):  No acute events lastnight. Remind  patient to be NPO    LDA: G18 Left     Abnormal labs/VS (needing any orders):     09/19/23 04:21   WBC 12.69 (H)     Potassium is 3.8 - to replace 20 meqs of K+ tab.      Neuro changes: A/O x4     Respiratory: 2L oxygen NC scheduled neb treatment, encouraged incentive spirometer.                           GI/GU: Continent x2, LBM 1/13 Intake and output measured     Nutrition: Regular, Consistent Carb     Activity/ Safety/ Mobility: independent      Questions (need for PT/OT, sitter): independent      Tele/ Rhythm: no tele     Skin/ Wounds: scattered bruising, BLE +1 edema      Case management needs:      Procedures:  Plan for bronchsocopy,     As per Pulmonology,for possible medical pleuroscopy versus EBUS TBNA + chest tube/IPC placement today    Discharge plan: Home w [facility]

## 2023-09-19 NOTE — Progress Notes (Signed)
 MEDICINE PROGRESS NOTE    Date Time: 09/19/23 1:11 PM  Patient Name: Carl [name]  Attending Physician: Robina Maude GRADE, DO    Assessment / Plan:   83 year old male with past medical history significant for COPD, chronic emphysema, CAD, paroxysmal A-fib on warfarin, OSA, GERD, hypertension, hyperlipidemia, diabetes, presented to hospital on 1/5 with dyspnea, found to have hypoxic respiratory failure secondary to left lung pneumonia and left lung effusion    Bronch today with biopsy and pleurx placement    s/p IV abx course for pneumonia now off.  No positive micro.    holding home warfarin (for afib).  On lovenox  bridging    Primary is likely lung ca (hilar mass).  CT/MRI neg for any other primary sources or distant mets.  Adrenal spot unclera if malignant involvement.  Plan is outpt PET/CT and go from there.      Home o2 eval pending before [place] [facility]    Sleep aid    If procedure goes well today plan for home tmrw.      Below copied forward for record keeping:  #Acute hypoxic respiratory failure,POA  - remains on 3 L of oxygen via nasal cannula.    -CTA chest Negative for pulmonary embolism however shows extensive left pleural nodularity pericardial thickening and enlarged left anterior cardiophrenic lymph node compatible with metastatic disease, persistent left pleural effusion slightly decreased and complete left lower lobe atelectasis and intermediate left adrenal nodule.  Echocardiogram shows concentric left ventricular remodeling, LVEF 73%.  - see below    # Metastatic adenocarcinoma of unclear primary, pleural fluid cytology came back positive for metastatic adenocarcinoma, primary source is currently unknown.    -Consulted oncology, ordered CT abdomen pelvis with and without contrast shows no potential primary tumor in abdomen or pelvis except for Small left adrenal gland nodule represents potential metastasis. MRI brain pending.   -d/w interventional pulmonology today, plan is for his bronchoscopy and  pleurx placement tomorrow 1/14.  - Follow-up their recommendations.   General pulmonology recommendations appreciated.    #Left pleural effusion, underwent thoracentesis by [facility] pulmonology on 1/7, 1.3 L was drained, repeat chest x-ray showed improved but persistent effusion.  Repeat ultrasound reported worsening effusion but bedside ultrasound does not support this.   - follow up on CxR    #Left lower lobe pneumonia- improving, on cefepime - Follansbee today, received total of 7days. WBC 13.03k down trending    #Mediastinal lymphadenopathy, cytology of pleural effusion- Malignant cells present, compatible with metastatic adenocarcinoma.     #COPD, not in exacerbation, remains on Breo Ellipta , DuoNebs every 6 hours,  pulmonology is following as well.  Received dupixent  300 mg on 1/7     #Paroxysmal A-fib, on warfarin as outpatient. Warfarin on Hold and currently on full dose lovenox  1mg /kg BID- to be held this evening and tomorrow for bronchoscopy.ral biopsy/Pleurx cath.   Cont with metoprolol      #Diabetes mellitus type 2, A1c 7.4, on glipizide  and metformin  at home--Continue with Lantus  and Humalog  sliding scale.    #HTN, CAD: not on ASA. Continue with  metoprolol , atorvastatin .Held home med Imdur  30, lisinopril , amlodipine  given borderline soft blood pressure.    #HFpEF, likely in mild exacerbation, PO Lasix  20 mg daily.    For DVT prophylaxis, on Lovenox   Code Status: Full code          Subjective     Feeling well overall.  No overt SOB at rest.          Physical Exam:  Exam:  GENERAL: AAOx 3. No acute distress. Well-nourished.  EYES: EOMI. Anicteric.  HENT: Moist mucous membranes. No scleral icterus. No lymphadenopathy.  LUNGS: CTA B/L. No accessory muscle use.  CARDIOVASCULAR: Regular rate and rhythm. No murmur. No JVD.  ABDOMEN: Soft, non-tender and non-distended. No palpable masses.  EXTREMITIES: No edema. Non-tender.  SKIN: No rashes or lesions. Warm.  NEUROLOGIC: No focal neurological  deficits  PSYCHIATRIC: Cooperative. Appropriate mood and affect.       Meds:     Medications were reviewed:  Current Facility-Administered Medications   Medication Dose Route Frequency    albuterol -ipratropium  3 mL Nebulization Q6H WA    allopurinol   300 mg Oral Daily    atorvastatin   40 mg Oral Daily    [Held by provider] enoxaparin   1 mg/kg Subcutaneous Q12H    fluticasone  furoate-vilanterol  1 puff Inhalation QAM    furosemide   20 mg Oral Daily    insulin  glargine  10 Units Subcutaneous QAM    insulin  lispro  1-5 Units Subcutaneous TID AC    metoprolol  succinate  100 mg Oral Q12H    senna-docusate  1 tablet Oral QHS     Infusion Meds[1]  PRN Medications[2]      Labs:     Labs (last 72 hours):    Recent Labs   Lab 09/19/23  0421 09/18/23  0408   WBC 12.69* 13.03*   Hemoglobin 14.1 13.7   Hematocrit 43.3 41.9   Platelet Count 327 290       Recent Labs   Lab 09/16/23  0317 09/15/23  1721   PT 17.1* 16.1*   INR 1.5 1.4   PTT  --  33    Recent Labs   Lab 09/19/23  0421 09/16/23  0317   Sodium 138 139   Potassium 3.8 3.7   Chloride 103 103   CO2 27 26   BUN 25 23   Creatinine 0.8 0.9   Calcium  10.0 10.3*   Glucose 169* 183*                   Microbiology, reviewed and are significant for:  Microbiology Results (last 15 days)       Procedure Component Value Units Date/Time    Culture and Gram Stain, Aerobic and Anaerobic Bacteria, Wound/Tissue/Fluid (Order) [[id number]] Collected: 09/12/23 1131    Order Status: Completed Specimen: Pleural Fluid from Pleural Cavity, Left Updated: 09/16/23 1433    Narrative:      The following orders were created for panel order Culture and Gram Stain, Aerobic and Anaerobic Bacteria, Wound/Tissue/Fluid (Order).  Procedure                               Abnormality         Status                     ---------                               -----------         ------                     Culture And Gram Stain,.SABRASABRA[[id number]]                      Final result  Please view  results for these tests on the individual orders.    Culture And Gram Stain, Aerobic Bacteria, Wound/Tissue/Fluid [[id number]] Collected: 09/12/23 1131    Order Status: Completed Specimen: Pleural Fluid from Pleural Cavity, Left Updated: 09/16/23 1433     Culture Aerobic Bacteria No growth     Gram Stain Many WBCs      No organisms seen    Narrative:      Stain performed on cytospin (concentrated) specimen.     Urine Streptococcus pneumoniae Antigen [[id number]]  (Normal) Collected: 09/11/23 0602    Order Status: Completed Specimen: Urine, Clean Catch Updated: 09/11/23 2055     Urine Streptococcus pneumoniae Antigen Negative     Comment: This is a presumptive test for the direct qualitative detection of bacterial antigen. This test is not intended as a substitute for a gram stain and bacterial culture. Samples with extremely low levels of antigen may yield negative results.       Urine Legionella pneumophila serogroup 1 Antigen [[id number]]  (Normal) Collected: 09/11/23 0602    Order Status: Completed Specimen: Urine, Clean Catch Updated: 09/11/23 2053     Urine Legionella pneumophila serogroup 1 Antigen Negative     Comment: This test will not detect infections caused by other Legionella pneumophila serogroups or by other Legionella species. A negative antigen result does not exclude infection with Legionella pneumophila serogroup 1.       Nares, MRSA (methicillin-resistant Staphylococcus aureus) Screening, PCR [[id number]]  (Normal) Collected: 09/11/23 0143    Order Status: Completed Specimen: Swab from Nares Updated: 09/11/23 0426     MRSA (methicillin resistant Staphylococcus aureus) DNA Not Detected     Comment: Testing performed with the Xpert MRSA NxG assay. This test is intended for the detection of methicillin-resistant Staphylococcus aureus (MRSA) DNA. This test is not intended to diagnose, guide, or monitor treatment for MRSA infections, or provide results of susceptibility to methicillin. A positive test  result does not necessarily indicate the presence of viable organisms. A negative result does not preclude MRSA nasal colonization.       Culture, Sputum and Lower Respiratory [[id number]]     Order Status: Sent Specimen: Sputum, Expectorated     COVID-19 and Influenza ([place]) (symptomatic) [[id number]]  (Normal) Collected: 09/10/23 1946    Order Status: Completed Specimen: Swab from Anterior Nares Updated: 09/10/23 2019     SARS-CoV-2 (COVID-19) RNA Not Detected     Influenza A RNA Not Detected     Influenza B RNA Not Detected    Narrative:      A result of Detected indicates POSITIVE for the presence of viral RNA  A result of Not Detected indicates NEGATIVE for the presence of viral RNA    Test performed using the Roche cobas [place] SARS-CoV-2 & Influenza A/B assay. This is a multiplex real-time RT-PCR assay for the detection of SARS-CoV-2, influenza A, and influenza B virus RNA. Viral nucleic acids may persist in vivo, independent of viability. Detection of viral nucleic acid does not imply the presence of infectious virus, or that virus nucleic acid is the cause of clinical symptoms. Negative results do not preclude SARS-CoV-2, influenza A, and/or influenza B infection and should not be used as the sole basis for diagnosis, treatment or other patient management decisions. Invalid results may be due to inhibiting substances in the specimen and recollection should occur.     Culture, Blood, Aerobic And Anaerobic [[id number]] Collected: 09/10/23 1946    Order Status: Completed Specimen: Blood, Venous Updated:  09/15/23 2300     Culture Blood No growth at 5 days                Signed by: Maude CINDERELLA Canavan, DO        This note was generated within the [employer] EMR using Dragon medical speech recognition software and may contain inherent errors or omissions not intended by the user. Grammatical and punctuation errors, random word insertions, deletions, pronoun errors and incomplete sentences are occasional consequences of this  technology due to software limitations. Not all errors are caught or corrected. Although every attempt is made to root out erroneus and incomplete transcription, the note may still not fully represent the intent or opinion of the author. If there are questions or concerns about the content of this note or information contained within the body of this dictation they should be addressed directly with the author for clarification. '       [1]   Current Facility-Administered Medications   Medication Dose Route Frequency Last Rate   [2]   Current Facility-Administered Medications   Medication Dose Route    acetaminophen   650 mg Oral    benzocaine -menthol   1 lozenge Buccal    benzonatate   100 mg Oral    calcium  carbonate  500 mg Oral    carboxymethylcellulose sodium  1 drop Both Eyes    dextrose   15 g of glucose Oral    Or    dextrose   12.5 g Intravenous    Or    dextrose   12.5 g Intravenous    Or    glucagon  (rDNA)  1 mg Intramuscular    dextrose   15 g of glucose Oral    Or    dextrose   12.5 g Intravenous    Or    dextrose   12.5 g Intravenous    Or    glucagon  (rDNA)  1 mg Intramuscular    melatonin  3 mg Oral    naloxone   0.2 mg Intravenous    potassium & sodium phosphates   2 packet Oral    potassium chloride   0-60 mEq Oral    Or    potassium chloride   0-60 mEq Oral    Or    potassium chloride   10 mEq Intravenous    saline  2 spray Each Nare    zolpidem   5 mg Oral

## 2023-09-20 ENCOUNTER — Encounter: Payer: Self-pay | Admitting: Pulmonary Disease

## 2023-09-20 LAB — WHOLE BLOOD GLUCOSE POCT
Whole Blood Glucose POCT: 220 mg/dL — ABNORMAL HIGH (ref 70–100)
Whole Blood Glucose POCT: 302 mg/dL — ABNORMAL HIGH (ref 70–100)

## 2023-09-20 MED ORDER — ENOXAPARIN SODIUM 100 MG/ML IJ SOSY
85.0000 mg | PREFILLED_SYRINGE | Freq: Two times a day (BID) | INTRAMUSCULAR | 0 refills | Status: DC
Start: 2023-09-20 — End: 2023-09-27

## 2023-09-20 NOTE — Progress Notes (Signed)
 Inpatient Solid Tumor PA spectra  [phone]  M-F 8am-430pm  After hours, call [phone]    INPATIENT ONCOLOGY FOLLOW-UP    Date Time: 09/20/23 12:31 PM  Patient Name: Carl [name]  Requesting Physician: Robina Maude GRADE, DO  Attending Oncologist:     Assessment and plan:   #1 metastatic adenocarcinoma possibly lung primary  #2 malignant pleural effusion  #3 CHF    Clinically stable and breathing improved after draining pleural fluid.  Reviewed brain MRI and no evidence of brain metastasis.  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  Will await pleural biopsies to confirm diagnosis and also to send molecular testing.  Will arrange outpatient PET/CT and office appointment with oncology.  Plan and findings discussed with patient as well as his son.  They are in agreement.    History:     Patient resting in bed and his son is by his bedside.  Had the Pleurx catheter placed yesterday tolerated procedure quite well.  No new complaints.    Allergies:   Allergies[1]    Medications:     Current Facility-Administered Medications   Medication Dose Route Frequency    albuterol -ipratropium  3 mL Nebulization Q6H WA    allopurinol   300 mg Oral Daily    atorvastatin   40 mg Oral Daily    enoxaparin   1 mg/kg Subcutaneous Q12H    fluticasone  furoate-vilanterol  1 puff Inhalation QAM    furosemide   20 mg Oral Daily    insulin  glargine  10 Units Subcutaneous QAM    insulin  lispro  1-5 Units Subcutaneous TID AC    metoprolol  succinate  100 mg Oral Q12H    senna-docusate  1 tablet Oral QHS       Review of Systems:   ROS     Physical Exam:     Vitals:    09/20/23 0412 09/20/23 0715 09/20/23 1004 09/20/23 1146   BP: 157/78 114/72 112/69 133/80   Pulse: 99 (!) 105 (!) 103 (!) 120   Resp: 18      Temp: 97.3 F (36.3 C) 98.1 F (36.7 C)  97.3 F (36.3 C)   TempSrc: Oral Oral  Oral   SpO2: 96% 96%  93%   Weight:       Height:            Intake and Output Summary (Last 24 hours) at Date  Time    Intake/Output Summary (Last 24 hours) at 09/20/2023 1231  Last data filed at 09/20/2023 1200  Gross per 24 hour   Intake 1150 ml   Output --   Net 1150 ml     Physical Exam   Labs Reviewed:     Results       Procedure Component Value Units Date/Time    Whole Blood Glucose POCT [[id number]]  (Abnormal) Collected: 09/20/23 0822    Specimen: Blood, Capillary Updated: 09/20/23 0833     Whole Blood Glucose POCT 220 mg/dL     Whole Blood Glucose POCT [[id number]]  (Abnormal) Collected: 09/19/23 2120    Specimen: Blood, Capillary Updated: 09/19/23 2126     Whole Blood Glucose POCT 234 mg/dL     Whole Blood Glucose POCT [[id number]]  (Abnormal) Collected: 09/19/23 1624    Specimen: Blood, Capillary Updated: 09/19/23 1631     Whole Blood Glucose POCT 158 mg/dL               Rads:   XR Chest AP Portable    Result Date:  09/19/2023  1. New left pleural catheter. No pneumothorax identified. 2. Mild vascular congestion with left pleural effusion and basilar airspace opacity, not significantly changed. Levada Cinnamon, MD 09/19/2023 4:20 PM    CT Abdomen Pelvis W IV/ WO PO Cont    Result Date: 09/17/2023  1. Extensive metastatic malignancy in the chest, described in full on earlier chest CT. 2. No potential primary tumor detectable in the abdomen or pelvis. 3. Small left adrenal gland nodule represents potential metastasis. 4. Moderate stool distention colon and rectum. 5. Remainder as above. Wolm Luis, MD 09/17/2023 11:54 AM    MRI Brain W WO Contrast    Result Date: 09/17/2023   1.No evidence of intracranial metastases. Alex Jude Crawford Ruth, MD 09/17/2023 10:20 AM    CT Angiogram Chest    Result Date: 09/15/2023  1. Negative for pulmonary embolism. 2. Extensive left pleural nodularity, pericardial thickening and an enlarged left anterior cardiophrenic lymph node compatible with metastatic disease. 3. Persistent left pleural effusion, slightly decreased. Persistent almost complete left lower lobe atelectasis. 4. Indeterminate left  adrenal nodule. Theopolis Rowan, MD 09/15/2023 6:27 PM    XR Chest AP Portable    Result Date: 09/15/2023  Interval progression of a left-sided pleural effusion with associated atelectasis and/or pneumonia. Lindie Cunning, MD 09/15/2023 5:51 AM    XR Chest AP Portable    Result Date: 09/14/2023  Unchanged small to moderate left pleural effusion. Theopolis Rowan, MD 09/14/2023 4:21 PM       Signed by: Jocelyne Robin, MD        [facility]  [address]   [place] [address]  [phone]        [1]   Allergies  Allergen Reactions    Cephalexin Rash

## 2023-09-20 NOTE — Plan of Care (Signed)
 Problem: Moderate/High Fall Risk Score >5  Goal: Patient will remain free of falls  Outcome: Progressing  Flowsheets (Taken 09/20/2023 1405)  High (Greater than 13): LOW-Fall Interventions Appropriate for Low Fall Risk     Problem: Inadequate Gas Exchange  Goal: Adequate oxygenation and improved ventilation  Outcome: Progressing  Flowsheets (Taken 09/20/2023 1405)  Adequate oxygenation and improved ventilation:   Assess lung sounds   Provide mechanical and oxygen support to facilitate gas exchange   Teach/reinforce use of incentive spirometer 10 times per hour while awake, cough and deep breath as needed   Plan activities to conserve energy: plan rest periods   Increase activity as tolerated/progressive mobility     Problem: Pain interferes with ability to perform ADL  Goal: Pain at adequate level as identified by patient  Outcome: Progressing  Flowsheets (Taken 09/20/2023 1405)  Pain at adequate level as identified by patient:   Identify patient comfort function goal   Assess for risk of opioid induced respiratory depression, including snoring/sleep apnea. Alert healthcare team of risk factors identified.   Assess pain on admission, during daily assessment and/or before any as needed intervention(s)   Reassess pain within 30-60 minutes of any procedure/intervention, per Pain Assessment, Intervention, Reassessment (AIR) Cycle   Evaluate if patient comfort function goal is met   Evaluate patient's satisfaction with pain management progress   Offer non-pharmacological pain management interventions   Include patient/patient care companion in decisions related to pain management as needed     Problem: Side Effects from Pain Analgesia  Goal: Patient will experience minimal side effects of analgesic therapy  Outcome: Progressing  Flowsheets (Taken 09/20/2023 1405)  Patient will experience minimal side effects of analgesic therapy:   Monitor/assess patient's respiratory status (RR depth, effort, breath sounds)    Prevent/manage side effects per LIP orders (i.e. nausea, vomiting, pruritus, constipation, urinary retention, etc.)   Assess for changes in cognitive function   Evaluate for opioid-induced sedation with appropriate assessment tool (i.e. POSS)

## 2023-09-20 NOTE — Consults (Signed)
 Start [facility] Note  Home Health Referral    Referral from Abigail HERO (Case Manager) for home health care upon discharge.    By [employer] systems, the patient has the right to freely choose a home care provider.    A company of the patients choosing. We have supplied the patient with a listing of providers in your area who asked to be included and participate in Medicare.   [facility] a home care agency that provides adult home care services and participates in Medicare   The preferred provider of your insurance company. Choosing a home care provider other than your insurance company's preferred provider may affect your insurance coverage.      Home Health Discharge Information    Your doctor has ordered Skilled Nursing and Physical Therapy in-home service(s) for you while you recuperate at home, to assist you in the transition from hospital to home.    The agency that you or your representative chose to provide the service:  Name of Home Health Agency Placement: [facility]]  Phone: [phone]     The above services were set up by:  Equilla Olden, RN [facility] Liaison)   Phone: [phone]      IF YOU HAVE NOT HEARD FROM YOUR HOME HEALTH AGENCY WITHIN 24-48 HOURS AFTER DISCHARGE PLEASE CALL YOUR AGENCY TO ARRANGE A TIME FOR YOUR FIRST VISIT. FOR ANY SCHEDULING CONCERNS OR QUESTIONS RELATED TO HOME HEALTH, SUCH AS TIME OR DATE PLEASE CONTACT YOUR HOME HEALTH AGENCY AT THE NUMBER LISTED ABOVE.    Additional comments:        START PATIENT REGISTRATION INFORMATION     Order Information  Order Signing Physician: Robina Maude GRADE, DO    Service Ordered RN ?: Yes  Service Ordered PT ?: Yes  Service Ordered OT ?: No    Service Ordered ST ?: No    Service Ordered MSW?: No    Service Ordered HHA?: No    Following Physician: Hassell Lorre Khat, MD   Following Physician Phone: [phone]   Overseeing Physician: N/A  (Required for Residents only)   Agreeable to Follow?: N/A  Spoke with:  N/A  Date/Time of Call: 09/20/23 12:01 PM      Care Coordination   SOC Call from [facility] Required?: no  [facility]?: no  Primary Care Physician:MHD Khat Hassell, MD  Primary Care Physician [phone]  Primary Care Physician Address: [phone] [address] / [place] [address]  PCP NPI: [id number]  Visit Instructions: N/A  Service Discharge Location Type: Home  Service Facility Name: N/A  Service Floor Facility: N/A  Service Room No: N/A    Demographics  Patient Last Name: [name]   Patient First Name: Carl  Language/Communication Barrier: no  Service Address: [address]  [place] [address]   Service Home Phone: [phone] (home)   Other phone numbers:    Telephone Information:   Mobile [phone]     Emergency Contact: Extended Emergency Contact Information  Primary Emergency Contact: [facility]  Address: [address]           [place], [place] [address] [place]  Mobile Phone: [phone]  Relation: Son  Interpreter needed? No    Admission Information  Admit Date: 09/10/2023  Patient Status at discharge: Inpatient  Admitting Diagnosis: Hypoxia [R09.02]  Pneumonia of left lower lobe due to infectious organism [J18.9]  Sepsis, due to unspecified organism, unspecified whether acute organ dysfunction present [A41.9]  Caregiver Information  Caregiver First Name: Gene   Caregiver Last Name: Harriott  Caregiver Relationship to Patient: Child  Caregiver Phone Number: [phone]  Caregiver Notes: N/A            Data Processing Manager Information  Primary Subscriber:   Primary Subscriber Relation To Guarantor:   Primary Payor:   Primary Plan:   Primary Group #:    Primary Subscriber ID:    Primary Subscriber DOB:   Secondary Insurance Information  Secondary Subscriber:   Secondary Subscriber Relation To Guarantor:   Secondary Payor:   Secondary Plan:   Secondary Group #:   Secondary Subscriber ID:   Secondary Subscriber DOB:    HITECH  NO      END PATIENT REGISTRATION INFORMATION       Diagnosis: Hypoxia [R09.02]  Pneumonia of left lower lobe due to infectious organism [J18.9]  Sepsis, due to unspecified organism, unspecified whether acute organ dysfunction present [A41.9]    Start [facility] Summary        Additional Comments:        Home Health face-to-face (FTF) Encounter (Order [id number])  Consult  Date: 09/15/2023 Department: [facility] Orig [address] [place] Ordering/Authorizing: Boneta Kingfisher, MD     Order Information    Order Date/Time Release Date/Time Start Date/Time End Date/Time   09/15/23 11:38 AM None 09/15/23 11:38 AM 09/15/23 11:38 AM     Order Details    Frequency Duration Priority Order Class   Once 1  occurrence Routine Hospital Performed     Standing Order Information    Remaining Occurrences Interval Last Released     0/1 Once 09/15/2023              Provider Information    Ordering User Ordering Provider Authorizing Provider   Coleman Showers, RN Boneta Kingfisher, MD Boneta Kingfisher, MD   Attending Provider(s) Admitting Provider PCP   Madelyn Rosina LABOR, MD; Dawne Gilles, MD; Gailen Hadassah Eleanor CHRISTELLA, MD; Boneta Kingfisher, MD; Ahmed Thedford SAUNDERS, MD; Karyle Pereyra, MD; Irl Avers, MD; Robina Maude GRADE, DO Mahalean, Sinziana, MD Hassell Lorre Khat, MD     Verbal Order Info    Action Created on Order Mode Entered by Responsible Provider Signed by Signed on   Ordering 09/15/23 1139 Telephone with doyal Coleman Showers, RN Boneta Kingfisher, MD Boneta Kingfisher, MD 09/15/23 1202           Comments    Provider to follow: Hassell Lorre Khat, MD -- [phone]    Home nursing required for skilled assessment including cardiopulmonary assessment and dietary education for disease management, and medication instruction. Home PT required for gait and balance training, strengthening, mobility, fall prevention, and ADL training.      Hypoxia [R09.02]  Pneumonia of left lower lobe due to infectious organism [J18.9]  Sepsis, due to  unspecified organism, unspecified whether acute organ dysfunction present [A41.9]                Home Health face-to-face (FTF) Encounter: Patient Communication     Not Released  Not seen         Order Questions    Question Answer   Date I saw the patient face-to-face: 09/15/2023   Evidence this patient is homebound because: B.  Profound weakness, poor balance/unsteady gait d/t illness/treatment/procedure    C.  Decreased endurance, strength, ROM, cadence, safety/judgment during mobility    I.  Restricted to home to decrease risk of infection  Medical conditions that necessitate Home Health care: C.  Risk for complication/infection/pain requiring follow up and monitoring    D.  Chronic illness & risk for re-hospitalization due to unstable disease status    B.  Functional impairment due to recent hospitalization/procedure/treatment    H.  Multiple new medications requiring management and monitoring   Per clinical findings, following services are medically necessary: Skilled Nursing    PT   Clinical findings that support the need for Skilled Nursing. SN will: C. Monitor for signs and symptoms of exacerbation of disease and management    D. Review medication reconciliation, manage and educate on use and side effects    G. Educate on new diagnosis, treatment & management to prevent re-hospitalization    H. Assess cardiopulmonary status and monitor for signs &symptoms of exacerbation   Clinical findings that support the need for Physical Therapy. PT will A.  Evaluate and treat functional impairment and improve mobility    C.  Educate on weight bearing status, stair/gait training, balance & coordination    E.  Educate on functional mobility; bed, chair, sit, stand and transfer activities    F.  Perform home safety assessment & develop safe in home exercise program    D.  Provide services to help restore function, mobility, and releive pain    G.  Implement activities to improve stance time, cadence & step length    H.   Educate on the safe use of assistive device/ durable medical equipment    I.  Instruct on restorative activities to restore ability to perform ADL   Other (please specify) see comments for orders SN/PT                    Process Instructions    Please select Home Care Services medically necessary.    Based on the above findings, I certify that this patient is confined to the home and needs intermittent skilled nursing care, physical therapry and / or speech therapy or continues to need occupational therapy. The patient is under my care, and I have initiated the establishment of the plan of care. This patient will be followed by a physician who will periodically review the plan of care.     Collection Information            Consult Order Info    ID Description Priority Start Date Start Time   [id number] Home Health face-to-face (FTF) Encounter Routine 09/15/2023 11:38 AM   Provider Specialty Referred to   ______________________________________ _____________________________________         Acknowledgement Info    For At Acknowledged By Acknowledged On   Placing Order 09/15/23 [address] RN 09/15/23 1143                     Verbal Order Info    Action Created on Order Mode Entered by Responsible Provider Signed by Signed on   Ordering 09/15/23 1139 Telephone with doyal Coleman Showers, RN Boneta Kingfisher, MD Boneta Kingfisher, MD 09/15/23 1202           Patient Information    Patient Name  [name] Carl Beagle Legal Sex  Male DOB  07-May-1941       Reprint Order Requisition    Home Health face-to-face (FTF) Encounter (Order #[id number]) on 09/15/23       Additional Information    Associated Reports External References   Priority and Order Details [employer]  End [facility] Summary     Discharge Date:  09/20/23    Referral Source  Signed by: Equilla Olden, RN  Date Time: 09/20/23 12:01 PM      End [facility] Note

## 2023-09-20 NOTE — Discharge Summary (Signed)
 Date Time: 09/20/23 10:19 AM  Patient Name: Carl [name]  Attending Physician: Robina Maude GRADE, DO    Assessment / Plan:   83 year old male with past medical history significant for COPD, chronic emphysema, CAD, paroxysmal A-fib on warfarin, OSA, GERD, hypertension, hyperlipidemia, diabetes, presented to hospital on 1/5 with dyspnea, found to have hypoxic respiratory failure secondary to left lung pneumonia and left lung effusion    s/p Bronch with biopsy and pleurx placement.  Outpt f/u for biopsy result.  Pleurx functioning well.  HH setup to help manage.    Primary is likely lung ca (hilar mass).  CT/MRI neg for any other primary sources or distant mets.  Adrenal spot unclera if malignant involvement.  Plan is outpt PET/CT and go from there.      s/p IV abx course for pneumonia now off.  No positive micro.    Been holding home warfarin (for afib).  On lovenox  bridging.  Will cont bridge after discharge until outpt INR check is therapeutic.    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   NIDDM2  #HTN, CAD:   #HFpEF,               Physical Exam:     Exam:  GENERAL: AAOx 3. No acute distress. Well-nourished.  EYES: EOMI. Anicteric.  HENT: Moist mucous membranes. No scleral icterus. No lymphadenopathy.  LUNGS: CTA B/L. No accessory muscle use.  CARDIOVASCULAR: Regular rate and rhythm. No murmur. No JVD.  ABDOMEN: Soft, non-tender and non-distended. No palpable masses.  EXTREMITIES: No edema. Non-tender.  SKIN: No rashes or lesions. Warm.  Pleurx site clean nontender.  NEUROLOGIC: No focal neurological deficits  PSYCHIATRIC: Cooperative. Appropriate mood and affect.       Meds:     Medications were reviewed:  Current Facility-Administered  Medications   Medication Dose Route Frequency    albuterol -ipratropium  3 mL Nebulization Q6H WA    allopurinol   300 mg Oral Daily    atorvastatin   40 mg Oral Daily    enoxaparin   1 mg/kg Subcutaneous Q12H    fluticasone  furoate-vilanterol  1 puff Inhalation QAM    furosemide   20 mg Oral Daily    insulin  glargine  10 Units Subcutaneous QAM    insulin  lispro  1-5 Units Subcutaneous TID AC    metoprolol  succinate  100 mg Oral Q12H    senna-docusate  1 tablet Oral QHS     Infusion Meds[1]  PRN Medications[2]      Labs:     Labs (last 72 hours):    Recent Labs   Lab 09/19/23  0421 09/18/23  0408   WBC 12.69* 13.03*   Hemoglobin 14.1 13.7   Hematocrit 43.3 41.9   Platelet Count 327 290       Recent Labs   Lab 09/16/23  0317 09/15/23  1721   PT 17.1* 16.1*   INR 1.5 1.4   PTT  --  33    Recent Labs   Lab 09/19/23  0421 09/16/23  0317   Sodium 138 139   Potassium 3.8 3.7   Chloride 103 103   CO2 27 26   BUN 25 23   Creatinine 0.8 0.9  Calcium  10.0 10.3*   Glucose 169* 183*                   Microbiology, reviewed and are significant for:  Microbiology Results (last 15 days)       Procedure Component Value Units Date/Time    Culture and Gram Stain, Aerobic and Anaerobic Bacteria, Wound/Tissue/Fluid (Order) [[id number]] Collected: 09/12/23 1131    Order Status: Completed Specimen: Pleural Fluid from Pleural Cavity, Left Updated: 09/16/23 1433    Narrative:      The following orders were created for panel order Culture and Gram Stain, Aerobic and Anaerobic Bacteria, Wound/Tissue/Fluid (Order).  Procedure                               Abnormality         Status                     ---------                               -----------         ------                     Culture And Gram Stain,.SABRASABRA[[id number]]                      Final result                 Please view results for these tests on the individual orders.    Culture And Gram Stain, Aerobic Bacteria, Wound/Tissue/Fluid [[id number]] Collected: 09/12/23 1131    Order  Status: Completed Specimen: Pleural Fluid from Pleural Cavity, Left Updated: 09/16/23 1433     Culture Aerobic Bacteria No growth     Gram Stain Many WBCs      No organisms seen    Narrative:      Stain performed on cytospin (concentrated) specimen.     Urine Streptococcus pneumoniae Antigen [[id number]]  (Normal) Collected: 09/11/23 0602    Order Status: Completed Specimen: Urine, Clean Catch Updated: 09/11/23 2055     Urine Streptococcus pneumoniae Antigen Negative     Comment: This is a presumptive test for the direct qualitative detection of bacterial antigen. This test is not intended as a substitute for a gram stain and bacterial culture. Samples with extremely low levels of antigen may yield negative results.       Urine Legionella pneumophila serogroup 1 Antigen [[id number]]  (Normal) Collected: 09/11/23 0602    Order Status: Completed Specimen: Urine, Clean Catch Updated: 09/11/23 2053     Urine Legionella pneumophila serogroup 1 Antigen Negative     Comment: This test will not detect infections caused by other Legionella pneumophila serogroups or by other Legionella species. A negative antigen result does not exclude infection with Legionella pneumophila serogroup 1.       Nares, MRSA (methicillin-resistant Staphylococcus aureus) Screening, PCR [[id number]]  (Normal) Collected: 09/11/23 0143    Order Status: Completed Specimen: Swab from Nares Updated: 09/11/23 0426     MRSA (methicillin resistant Staphylococcus aureus) DNA Not Detected     Comment: Testing performed with the Xpert MRSA NxG assay. This test is intended for the detection of methicillin-resistant Staphylococcus aureus (MRSA) DNA. This test is not intended to diagnose, guide, or monitor treatment for MRSA infections, or provide results  of susceptibility to methicillin. A positive test result does not necessarily indicate the presence of viable organisms. A negative result does not preclude MRSA nasal colonization.       Culture, Sputum and  Lower Respiratory [[id number]]     Order Status: Sent Specimen: Sputum, Expectorated     COVID-19 and Influenza ([place]) (symptomatic) [[id number]]  (Normal) Collected: 09/10/23 1946    Order Status: Completed Specimen: Swab from Anterior Nares Updated: 09/10/23 2019     SARS-CoV-2 (COVID-19) RNA Not Detected     Influenza A RNA Not Detected     Influenza B RNA Not Detected    Narrative:      A result of Detected indicates POSITIVE for the presence of viral RNA  A result of Not Detected indicates NEGATIVE for the presence of viral RNA    Test performed using the Roche cobas Liat SARS-CoV-2 & Influenza A/B assay. This is a multiplex real-time RT-PCR assay for the detection of SARS-CoV-2, influenza A, and influenza B virus RNA. Viral nucleic acids may persist in vivo, independent of viability. Detection of viral nucleic acid does not imply the presence of infectious virus, or that virus nucleic acid is the cause of clinical symptoms. Negative results do not preclude SARS-CoV-2, influenza A, and/or influenza B infection and should not be used as the sole basis for diagnosis, treatment or other patient management decisions. Invalid results may be due to inhibiting substances in the specimen and recollection should occur.     Culture, Blood, Aerobic And Anaerobic [[id number]] Collected: 09/10/23 1946    Order Status: Completed Specimen: Blood, Venous Updated: 09/15/23 2300     Culture Blood No growth at 5 days                Signed by: Maude CINDERELLA Canavan, DO        This note was generated within the EPIC EMR using Dragon medical speech recognition software and may contain inherent errors or omissions not intended by the user. Grammatical and punctuation errors, random word insertions, deletions, pronoun errors and incomplete sentences are occasional consequences of this technology due to software limitations. Not all errors are caught or corrected. Although every attempt is made to root out erroneus and incomplete  transcription, the note may still not fully represent the intent or opinion of the author. If there are questions or concerns about the content of this note or information contained within the body of this dictation they should be addressed directly with the author for clarification. '    >Time spent discharging pt: greater than 30 mins.      Pt understands to return to ED immediately should symptoms worsen or not completely resolve.  I advised the pt or the pt's listed family member not to operate machinery or weapons or drive motor vehicles for the duration of the prescribed course of any sedatives including benzos and opiates and anti histamines and muscle relaxants, and the pt or listed family member verbalized understanding.  Pt or listed family member were explained potential serious bleeding risk and death from bleeding with any blood thinners received in the hospital or after discharge and they verbalized understanding of inherent risk and agreed with blood thinners given that benefits outweight risk.  Pt has been advised not to operate machinery or motor vehicles until cleared to do so by the primary care physician or another physician.               [1]   Current Facility-Administered Medications  Medication Dose Route Frequency Last Rate   [2]   Current Facility-Administered Medications   Medication Dose Route    acetaminophen   650 mg Oral    benzocaine -menthol   1 lozenge Buccal    benzonatate   100 mg Oral    calcium  carbonate  500 mg Oral    carboxymethylcellulose sodium  1 drop Both Eyes    dextrose   15 g of glucose Oral    Or    dextrose   12.5 g Intravenous    Or    dextrose   12.5 g Intravenous    Or    glucagon  (rDNA)  1 mg Intramuscular    dextrose   15 g of glucose Oral    Or    dextrose   12.5 g Intravenous    Or    dextrose   12.5 g Intravenous    Or    glucagon  (rDNA)  1 mg Intramuscular    melatonin  3 mg Oral    naloxone   0.2 mg Intravenous    potassium & sodium phosphates   2 packet Oral     potassium chloride   0-60 mEq Oral    Or    potassium chloride   0-60 mEq Oral    Or    potassium chloride   10 mEq Intravenous    saline  2 spray Each Nare    zolpidem   5 mg Oral

## 2023-09-20 NOTE — Progress Notes (Signed)
 CASE MANAGEMENT DISCHARGE NOTE    Date Time: 09/20/23 12:43 PM  Patient Name: Carl [name]  Attending Physician: Robina Maude GRADE, DO  Hospital Day: 10    Date of Admission:  09/10/2023    Date of Discharge:  1/15/125    Reason for Admission:  Hypoxia [R09.02]  Pneumonia of left lower lobe due to infectious organism [J18.9]  Sepsis, due to unspecified organism, unspecified whether acute organ dysfunction present [A41.9]      CM/MD/RN bedside rounds completed. [employer] order placed for today. Son to provide transport home, [facility] set up and supplies ordered see below . OP Md f/u. SOC confirmed for tomorrow per [facility] team/ [facility] .ambulatory sat testing today, patient does not require home 02.        09/20/23 1240   Discharge Disposition   Patient preference/choice provided? Yes   Physical Discharge Disposition Home, Home Health   Name of Home Health Agency Placement [facility]   Name of DME Agency   ([facility] for PleurX cath supplies)   Patient/Family/POA notified of transfer plan Yes   Patient agreeable to discharge plan/expected d/c date? Yes   Family/POA agreeable to discharge plan/expected d/c date? Yes   Bedside nurse notified of transport plan? Yes   Special requirements for patient during transport: Other (comment)  (PleurX cath kits x 5)   Outpatient Services   Home Health Skilled Nursing;Home PT/OT/ST   CM Interventions   Follow up appointment scheduled? Yes   Is this appointment within 48-72 hours? Yes  (Home Health SN [facility] 09/21/23.)   Referral made for home health RN visit? Yes   Multidisciplinary rounds/family meeting before d/c? Yes   Medicare Checklist   Is this a Medicare patient? Yes   If LOS 3 days or greater, did patient received 2nd IMM Letter? Yes   Date of 2nd IMM Letter 09/20/23           Abigail Monks, BSN, RN  RN Case Manager 1  Case Management  [facility]  [phone]  [email or web address]

## 2023-09-20 NOTE — Discharge Summary -  Nursing (Signed)
 Discharge Note:    Discharge AVS printed and reviewed by RN   Primary RN did education and demonstration for self administration of enoxaparin  injections. Patient and patient son verbalize understanding.  Primary RN reviewed home health discharge information with patient. Patient verbalized understanding.   All belongings taken with patient , PIV removed.  Patient transported via wheelchair by staff.

## 2023-09-20 NOTE — Discharge Instr - AVS First Page (Addendum)
 Reason for your Hospital Admission:  Lung fluid      Instructions for after your discharge:  Carefully review your medication changes.  See your primary care doctor in one week.    Your home nurse will help you drain the Pleurx Catheter as often as necessary.    Resume taking your home coumadin  as soon as you get home and continue indefinitely.  Check your INR level daily until therapeutic (value of 2 to 3).    You will also be on a 2nd blood thinner (lovenox  injection) as a bridge until your coumadin  level is therapeutic again.  Take the lovenox  injection as soon as you get home, twice daily.  Check your INR level daily until therapeutic (value of 2 to 3).  Once the value is 2 or greater, stop taking the lovenox  injections.                Home Health Discharge Information     Your doctor has ordered Skilled Nursing and Physical Therapy in-home service(s) for you while you recuperate at home, to assist you in the transition from hospital to home.     The agency that you or your representative chose to provide the service:  Name of Home Health Agency Placement: [facility]]  Phone: [phone]      The above services were set up by:  Equilla Olden, RN [facility] Liaison)   Phone: [phone]        IF YOU HAVE NOT HEARD FROM YOUR HOME HEALTH AGENCY WITHIN 24-48 HOURS AFTER DISCHARGE PLEASE CALL YOUR AGENCY TO ARRANGE A TIME FOR YOUR FIRST VISIT. FOR ANY SCHEDULING CONCERNS OR QUESTIONS RELATED TO HOME HEALTH, SUCH AS TIME OR DATE PLEASE CONTACT YOUR HOME HEALTH AGENCY AT THE NUMBER LISTED ABOVE.

## 2023-09-20 NOTE — Plan of Care (Signed)
 Problem: Inadequate Gas Exchange  Goal: Adequate oxygenation and improved ventilation  Outcome: Progressing  Flowsheets (Taken 09/20/2023 0207)  Adequate oxygenation and improved ventilation:   Assess lung sounds   Monitor SpO2 and treat as needed   Increase activity as tolerated/progressive mobility   Consult/collaborate with Respiratory Therapy   Plan activities to conserve energy: plan rest periods  Goal: Patent Airway maintained  Outcome: Progressing  Flowsheets (Taken 09/20/2023 0207)  Patent airway maintained:   Provide adequate fluid intake to liquefy secretions   Position patient for maximum ventilatory efficiency     Problem: Pain interferes with ability to perform ADL  Goal: Pain at adequate level as identified by patient  Outcome: Progressing  Flowsheets (Taken 09/20/2023 0207)  Pain at adequate level as identified by patient:   Identify patient comfort function goal   Assess pain on admission, during daily assessment and/or before any as needed intervention(s)   Reassess pain within 30-60 minutes of any procedure/intervention, per Pain Assessment, Intervention, Reassessment (AIR) Cycle   Evaluate patient's satisfaction with pain management progress   Consult/collaborate with Physical Therapy, Occupational Therapy, and/or Speech Therapy   Include patient/patient care companion in decisions related to pain management as needed   Offer non-pharmacological pain management interventions   Consult/collaborate with Pain Service   Evaluate if patient comfort function goal is met   Assess for risk of opioid induced respiratory depression, including snoring/sleep apnea. Alert healthcare team of risk factors identified.     Problem: Side Effects from Pain Analgesia  Goal: Patient will experience minimal side effects of analgesic therapy  Outcome: Progressing  Flowsheets (Taken 09/20/2023 0207)  Patient will experience minimal side effects of analgesic therapy:   Monitor/assess patient's respiratory status (RR  depth, effort, breath sounds)   Evaluate for opioid-induced sedation with appropriate assessment tool (i.e. POSS)   Assess for changes in cognitive function

## 2023-09-20 NOTE — Nursing Progress Note (Addendum)
 Brief background: 83 year old male with past medical history significant for COPD, chronic emphysema, CAD, paroxysmal A-fib on warfarin, OSA, GERD, hypertension, hyperlipidemia, diabetes, presented to hospital on 1/5 with dyspnea, found to have hypoxic respiratory failure secondary to left lung pneumonia and left lung effusion      Thoracentesis 1/7 1.2L removed and sample sent. Cytology came back which showed carcinoma   Pulmo and Oncology updated patient's son regarding results.  D- dimer elevated. CTA neg for PE  Hyperglycemia - commenced on Lantus  and sliding scale.  On IV Lasix   Coumadin  stopped,Lovenox  started 1/10  Metformin  on hold due to pending scans  MRI brain done 1/12 - No evidence of intracranial metastases.      Last 24 Hrs (any critical events):      S/P placement of PleurX catheter on left side. Pt remains stable. No acute respiratory distress. Dressing is dry and intact.   No complaints of pain.   Given scheduled neb treatment.  Wean O2 currently at 1 L / min.      LDA: G18 Left     Abnormal labs/VS (needing any orders):  No labs    Neuro changes: A/O x4     Respiratory:     1 L oxygen via NC   scheduled neb treatment  encouraged incentive spirometer.                           GI/GU: Continent x2, LBM 1/13 Intake and output measured     Nutrition: Regular, Consistent Carb     Activity/ Safety/ Mobility: independent      Questions (need for PT/OT, sitter): independent      Tele/ Rhythm: no tele     Skin/ Wounds: scattered bruising, BLE +1 edema      Case management needs:      Procedures:       Discharge plan: Home w [facility]

## 2023-09-20 NOTE — Progress Notes (Signed)
PULSE OXIMETRY TESTING:     Document patient's oxygen at rest :   _____% on RA at rest (No ranges please).  IF 88% OR BELOW provide additional O2 and see next step,    _____% on oxygen, at rest on_____LPM via NC (document number of liters needed to maintain 89% or greater)       Next, test patient with ambulation (or exertion in bed IF bed bound) and document below:   _____% on RA with exertion (if 88% or below, provide additional O2 and see next step)   _____% on oxygen, with exertion, at_____LPM via NC (document number of liters needed to maintain 89% or greater)      All tests must be done during the same session.     Please copy and paste the above template and document results in a PROGRESS NOTE.     Testing to qualify for home oxygen must be no earlier than 48 hours prior to discharge, or it will need to be repeated.    Please contact X_______ when completed.

## 2023-09-20 NOTE — Nursing Progress Note (Signed)
 PULSE OXIMETRY TESTING:      Document patient's oxygen at rest :   __94___% on RA at rest (No ranges please).  IF 88% OR BELOW provide additional O2 and see next step,     _____% on oxygen, at rest on_____LPM via NC (document number of liters needed to maintain 89% or greater)         Next, test patient with ambulation (or exertion in bed IF bed bound) and document below:    __93___% on RA with exertion (if 88% or below, provide additional O2 and see next step)    _____% on oxygen, with exertion, at_____LPM via NC (document number of liters needed to maintain 89% or greater)     Patient completed test in one session with primary RN.  Patient remained on RA at rest for 5 minutes.  Patient ambulated on RA for a total of 5 minutes.  Patient denied symptoms of shortness of breath and dizziness during session of test.

## 2023-09-21 ENCOUNTER — Ambulatory Visit (INDEPENDENT_AMBULATORY_CARE_PROVIDER_SITE_OTHER): Payer: Self-pay

## 2023-09-21 ENCOUNTER — Telehealth (INDEPENDENT_AMBULATORY_CARE_PROVIDER_SITE_OTHER): Payer: Self-pay

## 2023-09-21 DIAGNOSIS — I4892 Unspecified atrial flutter: Secondary | ICD-10-CM

## 2023-09-21 NOTE — Progress Notes (Signed)
 September 22, 2023    INR Goal:  2-3    Warfarin Indication: Paroxysmal AFib/AFlutter     CHA2DS2-VASc Score:  5 HTN (1), Age >/=75 (2), DM (1), and Vascular Dx (1)  HASBLED Score:  1 Elderly (Age>65)    Referring Provider: Dr. Lamar Platts     Referral Date:  09/06/22    Last visit with referring provider:  09/01/22     PMH:    Past Medical History:   Diagnosis Date    Diabetes mellitus     Hyperlipidemia     Hypertension      Current warfarin dose:    Instructed Dose Reported Dose   Hospitalized 1/5 to 1/15 - warfarin doses in hospital updated [facility] Track  Lovenox  80 mg BID rx'd at discharge for bridge   Reports taking warfarin 5 mg since hospital discharge   7-day warfarin total: 32.5 mg 7-day warfarin total: 10 mg      Lab Results   Component Value Date    INR 6.0 (A) 09/22/2023    INR 1.5 09/16/2023    INR 1.4 09/15/2023    INR 1.4 09/15/2023    INR 1.3 09/15/2023    INR 1.3 09/14/2023     Dietary intake of vitamin K: Inconsistent. Typically 4-5 servings of green beans and salad (Spring mix, carrots, tomatoes, onions) per week. May have 1 serving of high vitamin K vegetables (collard greens) once monthly. Patient is in assisted living facility and eats whatever is served at the facility. Reports that he doesn't have any appetite and has eaten very few green veggies     Alcohol consumption: No alcohol consumption since 1989.      Tobacco use: Former, quit in 1989      Medication changes:  Hold amlodipine , lisinopril , and clonidine  per hospital discharge summary      New Medication/Dietary/Herbal Interactions: none of the medications being held impact INR     Concurrent significant interacting medications/supplements:    Medications that may enhance anticoagulant effect or bleeding risks:  APAP (patient takes prn <2000 mg/day)  Allopurinol   Glucosamine  Omega-3 fatty acids  Medications that may diminish anticoagulant effect:  Vitamin C  Metformin   MVI ([employer] brand, vitamin K 25 mcg).     S/sx of  bleeding/clotting:  Denies red or dark brown urine, red or black, tarry stool, vomiting or coughing up blood, unexplained bruising, bleeding that doesn't stop or is very heavy.  Denies sudden weakness in an arm or leg, facial drooping, numbness/tingling anywhere, vision changes or loss of sight in either eye, sudden painful headache, slurred speech or not being able to speak.      Recent Falls: Denies.   Patient uses cane to assist with mobility.      Upcoming procedures/surgeries: Denies      Is patient a candidate for DOAC?  Yes    If yes, date and outcome of discussion: 10/06/22: Per pharmacy liaison, copay for Eliquis is $771.30/90 days (periodic deductible $545+coinsurance $226.30). Once deductible met, Eliquis would be ~$75/month. Patient has Medicare and not eligible for copay card.  Patient prefers to continue warfarin for now.     Others: Hospitalized 1/5 to 1/15 for PNA with effusion. Pleurx drain placed and biopsy. Pt reports feeling better since hospital discharge. No abx at this time.   Instructed pt to check INR in the morning M-Th.  INR received after 3pm will be addressed the following business day.  Instructed patient to call [facility] if  no call received by 3PM the day after INR testing.     Best Contact:  Best time to reach patient is 8a-3pm.    INR Result:  Lab Results   Component Value Date    INR 6.0 (A) 09/22/2023     via home POCT error on first strip and then 6 on second strip    =======================================    A/P:  INR 6.0 above therapeutic range without clear cause. Pt has only had 10 mg of warfarin in past week due to hospitalization. Feel that INR result is inaccurate and asked pt to re-test today. He reported that he would in about an hour (expected ~1 pm). Called patient back (around 2 pm) and he stated he would check again, but hadn't yet. Informed pt to call before 4 pm. Of note, pt had been in range with TWD of 45 mg/wk a few weeks ago. Pt states he has a couple of test  strips left.  Hold warfarin today and recheck tomorrow based on protocol for INR >4.5  Patient confirmed understanding of instructions via the Teachback method. Patient verbally repeated dosing instructions back without error.  Recheck INR tomorrow on 09/22/23 via home POCT  Instructed pt to go to ED if they experience red or dark brown urine, red or black, tarry stool, vomiting or coughing up blood, unexplained bruising, bleeding that doesn't stop or is very heavy.     Warfarin has been managed per Clinic Protocol    Patient verbalized understanding of instructions provided.  Interview conducted via telephone.    Bernardino Robin, PharmD, [facility]  Clinical Pharmacy Specialist, Anticoagulation  Ph: [phone]

## 2023-09-21 NOTE — Progress Notes (Signed)
 AMBULATORY CARE MANAGEMENT Outreach Call    Hospital patient admitted to: [facility] Admission/Discharge Dates:   1/5-1/15     Reason for admission to hospital: Hypoxia, s/p Endobronchial U/S    Name: [facility]    ### Patient Details  Date of Birth: 1940-10-27  MRN: [id number]    ### Encounter Details  Arrival Date: 09/10/2023 07:27 PM EST  Discharge Date: 09/20/2023 03:32 PM EST  Encounter ID: [id number] TCM1/15/2025   3:32:00PM    ### Related interaction  [facility] - TCM V2 Post Discharge Outreach (Post Discharge TCM V2 Outreach 1) ([email or web address]    ### Questions     Question 1   Home Health Follow Up   Have you heard from the home health agency? Press 1 for yes, Press 2 for no   Hasn't heard from [facility] (Issue Panel: Post Discharge - TCM)    ### Required Interventions and Feedback     Call Status         Comments::     First attempt: pt at [phone]  Writer unable to reach patient or leave voicemail d/t no voicemail pickup.    Second attempt: Writer left detailed voicemail. (edited by DJ on 09/21/2023 12:15 PM EST)     Home Health - Actions Taken         Comments:     Writer encouraged patient to await 24-48 hours for [facility] agency outreach. Writer provided patient with [facility] agency contact information. LVM (edited by DJ on 09/21/2023 12:15 PM EST)    Doyne PARAS, MSN RN  [employer] She[employer]Her  Ambulatory Care Management  [phone]

## 2023-09-21 NOTE — Telephone Encounter (Addendum)
 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 Lon, Cardiology fuv 2/4    TC for post hospital follow-up, LVM for pt regarding current status and FUV for ongoing care, noted to have hospital fuv scheduled  1/22; unable to reach for discharge follow-up call and referred back to PCP office for further care.  If there is anything that you need, please give your office a call at [facility], [phone]."       *Instructed patient to call back if symptoms change or worsen and/or go to the emergency room or call 911 for emergency symptoms*

## 2023-09-22 ENCOUNTER — Telehealth: Payer: Self-pay | Admitting: Cardiology

## 2023-09-22 ENCOUNTER — Telehealth: Payer: Self-pay

## 2023-09-22 ENCOUNTER — Telehealth (INDEPENDENT_AMBULATORY_CARE_PROVIDER_SITE_OTHER): Payer: Self-pay

## 2023-09-22 LAB — FINE NEEDLE ASPIRATE

## 2023-09-22 LAB — EXTERNAL PT/INR
PT INR: 1.6 — AB (ref 0.9–1.1)
PT INR: 6 — AB (ref 0.9–1.1)

## 2023-09-22 NOTE — Telephone Encounter (Signed)
Spoke to patient's son.He stated father called wrong office.Stated he no longer sees Dr.Jordan.He moved to [place] and sees a cardiologist there.

## 2023-09-22 NOTE — Telephone Encounter (Signed)
Secure chat sent to anti-coag team - per [facility] he will reach out to patient to assist

## 2023-09-22 NOTE — Telephone Encounter (Signed)
 Copied from CRM [id number]. Topic: Clinical Support - Medical Question  >> Sep 22, 2023 11:07 AM Morrell BROCKS wrote:  [facility], Carl [name] called about Clinical Support - Medical Question.  Additional details:    Patient called to report he is out of INR supplies. Patient can be reached at [phone]. Please advise, thank you.    [facility]  Cardiac Connect

## 2023-09-22 NOTE — Telephone Encounter (Signed)
Telephone call from [facility] asking how often to drain the Pleurx catheter. MWF patient has a scheduled appointment on 1/21

## 2023-09-22 NOTE — Telephone Encounter (Signed)
Patient says that he was supposed to call so that he can report his INR for today so that they will tell him what to do tonight. His INR today was 1.6

## 2023-09-23 ENCOUNTER — Other Ambulatory Visit (INDEPENDENT_AMBULATORY_CARE_PROVIDER_SITE_OTHER): Payer: Self-pay | Admitting: Family Medicine

## 2023-09-23 ENCOUNTER — Ambulatory Visit (INDEPENDENT_AMBULATORY_CARE_PROVIDER_SITE_OTHER): Payer: Self-pay

## 2023-09-23 DIAGNOSIS — I4892 Unspecified atrial flutter: Secondary | ICD-10-CM

## 2023-09-23 NOTE — Progress Notes (Signed)
 September 23, 2023     Lab Results   Component Value Date    INR 6.0 (A) 09/22/2023    INR 1.6 (A) 09/22/2023     via home POCT- reported by patient via telephone, not reported to [employer]    Patient repeated INR via home POCT and resulted at 1.6 yesterday evening (01/17). First result of 6.0 likely not accurate. Patient reports he took warfarin 7.5 mg yesterday (01/17) and administered a Lovenox  injection. Instructed patient to take warfarin 5 mg daily until next INR check (next 7 day total 32.5 mg). Also informed pt to stop lovenox  injections. Recheck INR in 3 days on 09/26/23 via home POCT (patient reports he has 3 test strips left at this time). Patient reports today that he will call [employer] to follow up on testing supplies.     Patient verbalized understanding of instructions provided.  Encounter conducted via the telephone.      Thank you,    Wanda Sheriff, PharmD, BCPS  Clinical Pharmacy Specialist  [facility]  Ph:  [phone]

## 2023-09-26 ENCOUNTER — Telehealth: Payer: Self-pay

## 2023-09-26 ENCOUNTER — Encounter: Payer: Self-pay | Admitting: Medical Oncology

## 2023-09-26 ENCOUNTER — Other Ambulatory Visit: Payer: Self-pay | Admitting: Pulmonary Disease

## 2023-09-26 ENCOUNTER — Other Ambulatory Visit (INDEPENDENT_AMBULATORY_CARE_PROVIDER_SITE_OTHER): Payer: Self-pay

## 2023-09-26 ENCOUNTER — Other Ambulatory Visit (FREE_STANDING_LABORATORY_FACILITY): Payer: Medicare Other

## 2023-09-26 ENCOUNTER — Ambulatory Visit: Payer: Medicare Other | Attending: Anatomic and Clinical Pathology | Admitting: Medical Oncology

## 2023-09-26 ENCOUNTER — Encounter (INDEPENDENT_AMBULATORY_CARE_PROVIDER_SITE_OTHER): Payer: Self-pay

## 2023-09-26 ENCOUNTER — Other Ambulatory Visit (INDEPENDENT_AMBULATORY_CARE_PROVIDER_SITE_OTHER): Payer: Medicare Other

## 2023-09-26 ENCOUNTER — Ambulatory Visit (INDEPENDENT_AMBULATORY_CARE_PROVIDER_SITE_OTHER): Payer: Self-pay

## 2023-09-26 VITALS — BP 138/75 | HR 108 | Temp 97.3°F | Resp 18 | Ht 63.0 in | Wt 183.8 lb

## 2023-09-26 DIAGNOSIS — C3412 Malignant neoplasm of upper lobe, left bronchus or lung: Secondary | ICD-10-CM

## 2023-09-26 DIAGNOSIS — C3492 Malignant neoplasm of unspecified part of left bronchus or lung: Secondary | ICD-10-CM | POA: Insufficient documentation

## 2023-09-26 DIAGNOSIS — Z719 Counseling, unspecified: Secondary | ICD-10-CM | POA: Insufficient documentation

## 2023-09-26 DIAGNOSIS — C801 Malignant (primary) neoplasm, unspecified: Secondary | ICD-10-CM | POA: Insufficient documentation

## 2023-09-26 DIAGNOSIS — C7802 Secondary malignant neoplasm of left lung: Secondary | ICD-10-CM | POA: Insufficient documentation

## 2023-09-26 DIAGNOSIS — I4892 Unspecified atrial flutter: Secondary | ICD-10-CM

## 2023-09-26 LAB — EXTERNAL PT/INR: PT INR: 5.7 — AB (ref 0.9–1.1)

## 2023-09-26 LAB — HEPATITIS B SURFACE (HBV) ANTIBODY, QUANTITATIVE: Hepatitis B Surface Antibody: <3.31 m[IU]/mL

## 2023-09-26 LAB — PT/INR
INR: 7.2 (ref 0.9–1.1)
PT: 80 s — ABNORMAL HIGH (ref 10.1–12.9)

## 2023-09-26 LAB — HEPATITIS C ANTIBODY, TOTAL: Hepatitis C Antibody: NONREACTIVE

## 2023-09-26 LAB — HEPATITIS B (HBV) SURFACE ANTIGEN WITH REFLEX TO CONFIRMATION: Hepatitis B Surface Antigen: NONREACTIVE

## 2023-09-26 LAB — HEPATITIS B CORE ANTIBODY, TOTAL: Hepatitis B Core Total Antibody: NONREACTIVE

## 2023-09-26 LAB — HIV-1/2, ANTIGEN AND ANTIBODY WITH REFLEX TO CONFIRMATION: HIV Ag/Ab 4th Generation: NONREACTIVE

## 2023-09-26 NOTE — Telephone Encounter (Signed)
 Nurse Navigator  Initial Visit    Patient seen as an initial consult by Dr. Jeffie today for a diagnosis of lung cancer     Pt accompanied by his family     Introduced myself and the role of the Nurse Navigator.     [x]  Action/Treatment Plan: treatment plan TBD pending PET and NGS    [x]  Included patient and family members/significant others in education: Patient, Family    [x]  Assessed for barriers to education: None Identified    [x]  Preferred language: English    [x]  Preferred method of education: verbal instuctions, handouts     [x]  Assistance needed for education: None    [x]  Assessed for Advance Directive: will follow up with either physician or navigator    [x]  Education packet given to patient and handouts reviewed including: [facility] information, [employer] Cancer Distress Screening Information Sheet, and clinic contact information    [x]  Patient/family educated on [employer] telephone tree and importance of following prompts     [x]  Patient/family educated on programs and services of [facility]    [x]  Patient/family educated on [employer] Cancer Distress Screening process    [x]  Reviewed the education process with patient/family and arranged the following appointments (date/time/location):      [x]  Labs: Pre chemo labs and XF today (TRK [id number] )     [x]  Diagnostic testing/procedures: PET scan scheduled for 1/30. Will try to move sooner per provider      [x]  Chemotherapy/Biotherapy Teaching session: TBD     [x]  Follow up with Provider (APP/MD): After PET scan     [x]  Referrals: SW for hospital bed, nutrition due to poor appetite     [x]  Patient/family members questions addressed    [x]  Evaluated patient/family members understanding of education: Patient verbalizes understanding.     Thoracic Oncology Nurse Navigator contact information provided. Will remain as an available resource for this patient and assist with coordination of care as needed.      Pastor Barlow RN MSN  Nurse Navigator for  Thoracic Oncology  [facility]  [address]  [place], [place] [address]  MALVA: [phone]  F: [phone]

## 2023-09-26 NOTE — Progress Notes (Addendum)
 September 26, 2023    INR Goal:  2-3    Warfarin Indication: Paroxysmal AFib/AFlutter     CHA2DS2-VASc Score:  5 HTN (1), Age >/=75 (2), DM (1), and Vascular Dx (1)  HASBLED Score:  1 Elderly (Age>65)    Referring Provider: Dr. Lamar Platts     Referral Date:  09/06/22    Last visit with referring provider:  09/01/22     PMH:    Past Medical History:   Diagnosis Date    Diabetes mellitus     Hyperlipidemia     Hypertension      Current warfarin dose:    Instructed Dose Reported Dose   Hospitalized 1/5 to 1/15 - warfarin doses in hospital updated [facility] [place] Track  Take warfarin 5 mg daily until next INR check  No deviation from instructed dose.  Denies missed or extras dose(s).    7-day warfarin total: 32.5 mg 7-day warfarin total: 32.5 mg      Lab Results   Component Value Date    INR 5.7 (A) 09/26/2023    INR 6.0 (A) 09/22/2023    INR 1.6 (A) 09/22/2023    INR 1.5 09/16/2023    INR 1.4 09/15/2023    INR 1.4 09/15/2023     Dietary intake of vitamin K: Inconsistent. Typically 4-5 servings of green beans and salad (Spring mix, carrots, tomatoes, onions) per week. May have 1 serving of high vitamin K vegetables (collard greens) once monthly. Patient is in assisted living facility and eats whatever is served at the facility. Reports he hasn't been eating much of anything since hospital discharge due to his reduced appetite. No green vegetables at all since last [facility] encounter.     Alcohol consumption: No alcohol consumption since 1989.      Tobacco use: Former, quit in 1989      Medication changes:  No changes.     New Medication/Dietary/Herbal Interactions: N/A    Concurrent significant interacting medications/supplements:    Medications that may enhance anticoagulant effect or bleeding risks:  APAP (patient takes prn <2000 mg/day)  Allopurinol   Glucosamine  Omega-3 fatty acids  Medications that may diminish anticoagulant effect:  Vitamin C  Metformin   MVI ([employer] brand, vitamin K 25 mcg)- reports today that he stopped taking  since hospital admission     S/sx of bleeding/clotting:  Denies red or dark brown urine, red or black, tarry stool, vomiting or coughing up blood, unexplained bruising, bleeding that doesn't stop or is very heavy.     Recent Falls: Denies.   Patient uses cane to assist with mobility.      Upcoming procedures/surgeries: Nothing scheduled at this time.     Is patient a candidate for DOAC?  Yes    If yes, date and outcome of discussion: 10/06/22: Per pharmacy liaison, copay for Eliquis is $771.30/90 days (periodic deductible $545+coinsurance $226.30). Once deductible met, Eliquis would be ~$75/month. Patient has Medicare and not eligible for copay card.  Patient prefers to continue warfarin for now.     Others: Hospitalized 1/5 to 1/15 for PNA with effusion. Pleurx drain placed and biopsy. Pt reports feeling better since hospital discharge. No abx at this time.   Instructed pt to check INR in the morning M-Th.  INR received after 3pm will be addressed the following business day.  Instructed patient to call [facility] if no call received by 3PM the day after INR testing.     Best Contact:  Best time to reach patient  is 8a-3pm.    INR Result:  Lab Results   Component Value Date    INR 5.7 (A) 09/26/2023     via home POCT   =======================================    A/P:  INR 5.7 above therapeutic range. High INR possibly due to reduced Vit K intake in diet/stopping MVI with Vit K. Of note, pt had been in range with TWD of 45 mg/wk a few weeks ago.  Patient has an appt with oncology today (01/21) and will go to lab for confirmatory PT/INR test (given issues with home POCT machine last week).   Standing PT/INR lab order placed today for [facility] labs (exp 09/25/2024).  Order sent to patient via [facility] today  Instructed patient to HOLD warfarin dose tonight if he does not hear from us  regarding lab result.   Instructed pt to go to ED if they experience red or dark brown urine, red or black, tarry stool, vomiting or coughing up  blood, unexplained bruising, bleeding that doesn't stop or is very heavy.     Warfarin has been managed per Clinic Protocol    Patient verbalized understanding of instructions provided.  Interview conducted via telephone.    Wanda Sheriff, PharmD, BCPS  Clinical Pharmacy Specialist  [facility]  Ph:  [phone]

## 2023-09-26 NOTE — Progress Notes (Signed)
 [facility]  [address]  [place], [place] [address]  P: [phone]  F: [phone]     Assessment & Plan:       1. 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 is here to establish outpatient oncology care.  We reviewed his records including the pathology findings, imaging scans and performed a clinical evaluation.  Based upon this review patient is a former tobacco smoker with an ECOG performance status at 2 presenting with metastatic malignancy as noted by left lung infiltrate with pleural effusion, adenopathy in the chest and abdomen.  Pleural fluid cytology was identified as adenocarcinoma but the left lung nodule and station 11L lymph nodes were positive for squamous cell carcinoma.    We explained to the patient he likely has metastatic squamous cell carcinoma.  The p16 positive immunostain suggests it could be a head and neck or anal primary possibly due to HPV.  But given some of these uncertainties further workup is needed to accurately establish the diagnosis.  But at the same time given the extensive presence of disease including a malignant pleural effusion the tentative preliminary TNM stage is stage IV.    We explained to them the need for further workup to establish the primary tumor source and to confirm staging.  We will request a PET CT scan, send peripheral blood and tumor tissue for molecular testing.  We have reached out to Dr. Toya, cytopathology to rereviewed the slides to identify if we are dealing with 2 separate primary tumors versus a single squamous cell primary currently of unknown origin.  Reviewed all of this in detail with the patient as well as his family members.  Also discussed overall goals of care and patient would like to proceed on workup as well as eventual treatment even if it is with palliative intent.  We will follow-up in the office once PET/CT is  completed.    No orders of the defined types were placed in this encounter.    There are no Patient Instructions on file for this visit.    TNM STAGE  Cancer Staging   No matching staging information was found for the patient.      ONCOLOGY HISTORY  Oncology History   Metastatic squamous cell carcinoma involving lung with unknown primary site   09/12/2023 Initial Diagnosis    BODY CAVITY,  LEFT PLEURAL FLUID, THORACENTESIS:  -Malignant cells present, compatible with metastatic adenocarcinoma.     09/19/2023 Biopsy    A. Left upper lung lobe nodule, transbronchial biopsy: Squamous cell carcinoma     A. LYMPH NODE, STATION 11L WITH LEFT HILAR MASS, EBUS-FNA: SQUAMOUS CELL CARCINOMA     B. LYMPH NODE, STATION 4L,EBUS-FNA: NEGATIVE FOR MALIGNANCY             Subjective:     HISTORY OF PRESENT ILLNESS  Carl [name] is 83 y.o. male who Recently presented to the hospital with progressive fatigue and shortness of breath.  He was noted to have left-sided pleural effusion with an infiltrate as well as extensive adenopathy in the chest and abdomen.  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  But on the CT abdomen and pelvis no GI tumor source was identified.  He also underwent an EBUS with bronchoscopy and the left lung endobronchial nodule and 11R lymph node were  identified as involved with squamous cell carcinoma.  Today he is here with his son and daughter-in-law to establish outpatient oncology care.    During the visit today patient notes that his shortness of breath is improved compared to his hospital presentation but not fully resolved.  Particularly at nighttime he sometimes has difficulty laying flat in bed and developed shortness of breath.  He is able to manage all of his ADLs on his own and otherwise somewhat sedentary.  He does have some intermittent cough and is lost approximately 30 pounds of body weight.  His appetite  is diminished.  No other complaints at this time.  .    Allergies[1]  Current Medications[2]  Medical History[3]  Past Surgical History[4]  Family History[5]  Social History[6]  Review of Systems   Constitutional:  Positive for appetite change, fatigue and unexpected weight change.   HENT: Negative.     Eyes: Negative.    Respiratory:  Positive for shortness of breath.    Cardiovascular: Negative.    Gastrointestinal: Negative.    Genitourinary: Negative.    Musculoskeletal: Negative.    Neurological: Negative.    Hematological: Negative.    Psychiatric/Behavioral: Negative.         Objective:     Vitals:    09/26/23 1509   BP: 138/75   BP Site: Right arm   Patient Position: Sitting   Cuff Size: Medium   Pulse: (!) 108   Resp: 18   Temp: 97.3 F (36.3 C)   TempSrc: Temporal   SpO2: 99%   Weight: 83.4 kg (183 lb 12.8 oz)   Height: 1.6 m (5' 3)          Physical Exam  Vitals reviewed.   Constitutional:       General: He is not in acute distress.     Appearance: He is normal weight. He is not toxic-appearing.   HENT:      Head: Normocephalic and atraumatic.      Nose: Nose normal. No congestion or rhinorrhea.      Mouth/Throat:      Mouth: Mucous membranes are moist.      Pharynx: Oropharynx is clear. No oropharyngeal exudate or posterior oropharyngeal erythema.   Eyes:      General: No scleral icterus.        Right eye: No discharge.         Left eye: No discharge.      Extraocular Movements: Extraocular movements intact.      Conjunctiva/sclera: Conjunctivae normal.   Cardiovascular:      Rate and Rhythm: Normal rate and regular rhythm.      Heart sounds: Normal heart sounds. No murmur heard.     No gallop.   Pulmonary:      Effort: Pulmonary effort is normal. No respiratory distress.      Breath sounds: Normal breath sounds. No wheezing or rales.   Abdominal:      General: Bowel sounds are normal. There is no distension.      Palpations: Abdomen is soft.      Tenderness: There is no abdominal tenderness. There is  no guarding.   Musculoskeletal:         General: Normal range of motion.      Cervical back: Normal range of motion and neck supple. No rigidity.      Right lower leg: No edema.      Left lower leg: No edema.   Lymphadenopathy:  Cervical: No cervical adenopathy.   Skin:     General: Skin is warm.      Coloration: Skin is not jaundiced.   Neurological:      General: No focal deficit present.      Mental Status: He is alert and oriented to person, place, and time.      Cranial Nerves: No cranial nerve deficit.   Psychiatric:         Mood and Affect: Mood normal.         Behavior: Behavior normal.         LAB DATA:   WBC   Date/Time Value Ref Range Status   09/19/2023 04:21 AM 12.69 (H) 3.10 - 9.50 x10 3/uL Final   12/23/2022 10:09 AM 10.69 (H) 3.10 - 9.50 x10 3/uL Final     Hemoglobin   Date/Time Value Ref Range Status   09/19/2023 04:21 AM 14.1 12.5 - 17.1 g/dL Final     Hgb   Date/Time Value Ref Range Status   12/23/2022 10:09 AM 14.1 12.5 - 17.1 g/dL Final     Platelet Count   Date/Time Value Ref Range Status   09/19/2023 04:21 AM 327 142 - 346 x10 3/uL Final     Platelets   Date/Time Value Ref Range Status   12/23/2022 10:09 AM 231 142 - 346 x10 3/uL Final     Neutrophils Absolute   Date/Time Value Ref Range Status   12/23/2022 10:09 AM 7.18 (H) 1.10 - 6.33 x10 3/uL Final     Absolute Neutrophils   Date/Time Value Ref Range Status   09/19/2023 04:21 AM 9.72 (H) 1.10 - 6.33 x10 3/uL Final     Sodium   Date/Time Value Ref Range Status   09/19/2023 04:21 AM 138 135 - 145 mEq/L Final   02/08/2023 11:05 AM 142 135 - 145 mEq/L Final     Potassium   Date/Time Value Ref Range Status   09/19/2023 04:21 AM 3.8 3.5 - 5.3 mEq/L Final   02/08/2023 11:05 AM 4.7 3.5 - 5.3 mEq/L Final     Magnesium    Date/Time Value Ref Range Status   09/19/2023 04:21 AM 2.0 1.6 - 2.6 mg/dL Final   94/93/7975 97:93 PM 1.8 1.6 - 2.6 mg/dL Final     Creatinine   Date/Time Value Ref Range Status   09/19/2023 04:21 AM 0.8 0.5 - 1.5 mg/dL Final    93/94/7975 88:94 AM 1.1 0.5 - 1.5 mg/dL Final     Bilirubin, Total   Date/Time Value Ref Range Status   09/11/2023 06:17 AM 0.6 0.2 - 1.2 mg/dL Final   97/78/7975 91:45 AM 0.8 0.2 - 1.2 mg/dL Final     AST (SGOT)   Date/Time Value Ref Range Status   09/11/2023 06:17 AM 43 (H) <=41 U/L Final   10/26/2022 08:54 AM 26 5 - 41 U/L Final     ALT   Date/Time Value Ref Range Status   09/11/2023 06:17 AM 17 <=55 U/L Final   10/26/2022 08:54 AM 25 0 - 55 U/L Final     Protein, Total   Date/Time Value Ref Range Status   09/11/2023 06:17 AM 6.5 6.0 - 8.3 g/dL Final     Comment:     Delta investigated   10/26/2022 08:54 AM 6.3 6.0 - 8.3 g/dL Final     Albumin    Date/Time Value Ref Range Status   09/11/2023 06:17 AM 2.4 (L) 3.5 - 5.0 g/dL Final   97/78/7975 91:45 AM 3.6  3.5 - 5.0 g/dL Final       RADIOLOGY DATA:   XR Chest AP Portable    Result Date: 09/19/2023  1. New left pleural catheter. No pneumothorax identified. 2. Mild vascular congestion with left pleural effusion and basilar airspace opacity, not significantly changed. Levada Cinnamon, MD 09/19/2023 4:20 PM      Jocelyne Robin, MD          [1]   Allergies  Allergen Reactions    Cephalexin Rash   [2]   Current Outpatient Medications   Medication Sig Dispense Refill    albuterol  sulfate HFA (PROVENTIL ) 108 (90 Base) MCG/ACT inhaler Inhale 2 puffs into the lungs      allopurinol  (ZYLOPRIM ) 300 MG tablet Take 1 tablet (300 mg) by mouth daily 90 tablet 3    atorvastatin  (LIPITOR) 40 MG tablet Take 1 tablet (40 mg) by mouth daily 90 tablet 3    chlorthalidone  25 MG tablet Take 0.5 tablets (12.5 mg) by mouth daily 45 tablet 3    clobetasol (TEMOVATE) 0.05 % cream Apply topically as needed      enoxaparin  (LOVENOX ) 100 MG/ML syringe Inject 0.85 mLs (85 mg) into the skin every 12 (twelve) hours for 7 days 11.9 mL 0    fluticasone  furoate-vilanterol (BREO ELLIPTA ) 200-25 MCG/ACT Aerosol Pwdr, Breath Activated Inhale 1 puff into the lungs daily 1 each 2    furosemide   (LASIX ) 20 MG tablet Take 1 tablet (20 mg) by mouth daily 180 tablet 3    glipiZIDE  (GLUCOTROL  XL) 5 MG 24 hr tablet Take 1 tablet (5 mg) by mouth daily 90 tablet 3    Glucosamine-Chondroit-Vit C-Mn (GLUCOSAMINE 1500 COMPLEX PO) Take 2 tablets by mouth daily      isosorbide  mononitrate (IMDUR ) 30 MG 24 hr tablet Take 1 tablet (30 mg) by mouth daily 90 tablet 3    metFORMIN  (GLUCOPHAGE -XR) 500 MG 24 hr tablet Take 2 tablets (1,000 mg) by mouth 2 (two) times daily 360 tablet 0    metoprolol  succinate (TOPROL -XL) 100 MG 24 hr tablet Take 1 tablet (100 mg) by mouth daily 90 tablet 3    Vitamin D, Cholecalciferol, 50 MCG (2000 UT) Cap Take 2,000 Units by mouth daily      warfarin (COUMADIN ) 5 MG tablet Take 1.5 tablets (7.5 mg) by mouth every evening 135 tablet 2     No current facility-administered medications for this visit.     Facility-Administered Medications Ordered in Other Visits   Medication Dose Route Frequency Provider Last Rate Last Admin    0.9% NaCl infusion   Intravenous Continuous PRN Posada Londono, Adriana M, MD   New Bag at 09/19/23 1345    phenylephrine  (NEOSYNEPHRINE) 10 mcg/mL pediatric IV syringe   Intravenous PRN Posada Londono, Adriana M, MD   100 mcg at 09/19/23 1459    propofol  (DIPRIVAN ) 10 mg/mL infusion (ADULT)   Intravenous Continuous PRN Posada Londono, Adriana M, MD   Stopped at 09/19/23 1445    propofol  (DIPRIVAN ) injection   Intravenous PRN Posada Londono, Adriana M, MD   20 mg at 09/19/23 1452   [3]   Past Medical History:  Diagnosis Date    Diabetes mellitus     Hyperlipidemia     Hypertension    [4]   Past Surgical History:  Procedure Laterality Date    CARDIAC SURGERY  2004    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    PLACEMENT, INDWELLING CATHETER PLEURAL Left 09/19/2023    Procedure: PLACEMENT,  INDWELLING TUNNELED CATHETER, PLEURAL;  Surgeon: Maree Jinnie RAMAN, MD;  Location: [id number];  Service: Pulmonary;  Laterality: Left;  UNIVERSAL    SKIN BIOPSY      TONSILLECTOMY       ULTRASOUND, ENDOBRONCHIAL (EBUS) N/A 09/19/2023    Procedure: ULTRASOUND, ENDOBRONCHIAL (EBUS);  Surgeon: Maree Jinnie RAMAN, MD;  Location: [facility];  Service: Pulmonary;  Laterality: N/A;   [5]   Family History  Problem Relation Name Age of Onset    Lung cancer Brother     [6]   Social History  Socioeconomic History    Marital status: Widowed   Tobacco Use    Smoking status: Former     Types: Cigarettes     Passive exposure: Past    Tobacco comments:     Quit smoking June 1989, smoked 30 years before   Vaping Use    Vaping status: Never Used   Substance and Sexual Activity    Alcohol use: Not Currently    Drug use: Not Currently    Sexual activity: Not Currently     Social Drivers of Health     Financial Resource Strain: Low Risk  (09/11/2023)    Overall Financial Resource Strain (CARDIA)     Difficulty of Paying Living Expenses: Not hard at all   Food Insecurity: No Food Insecurity (09/10/2023)    Hunger Vital Sign     Worried About Running Out of Food in the Last Year: Never true     Ran Out of Food in the Last Year: Never true   Transportation Needs: No Transportation Needs (09/11/2023)    PRAPARE - Therapist, Art (Medical): No     Lack of Transportation (Non-Medical): No   Physical Activity: Insufficiently Active (05/11/2023)    Exercise Vital Sign     Days of Exercise per Week: 6 days     Minutes of Exercise per Session: 20 min   Stress: No Stress Concern Present (03/11/2022)    Received from [facility] - Occupational Stress Questionnaire     Feeling of Stress : Not at all    Received from [facility]    Social Network   Intimate Partner Violence: Not At Risk (09/10/2023)    Humiliation, Afraid, Rape, and Kick questionnaire     Fear of Current or Ex-Partner: No     Emotionally Abused: No     Physically Abused: No     Sexually Abused: No   Housing Stability: Low Risk  (09/11/2023)    Housing Stability Vital Sign     Unable to Pay for  Housing in the Last Year: No     Number of Times Moved in the Last Year: 0     Homeless in the Last Year: No

## 2023-09-26 NOTE — Telephone Encounter (Signed)
 Per Dr Maree, patient will need PET CT    10/05/23 at 10 am arrival    PET of [facility]  [address]  [place], [place] [address]    Phone: [phone]    Nothing to eat or drink (only water ) 6 hours prior.     Avoid carbohydrates 24 hours prior to exam    No strenuous activity 24 hours prior to exam    No caffeine 24 hours prior to exam to exam    Drink lots of water  prior to exam, hydration is important.    Scanner operates optimally in a cool environment, and you will be provided with a heated blanket, but you may want to bring a sweater (no metal).     The exam will require 2 to 2 1/2 hours.      Carmine [name], BSN RN  Nurse Navigator-Lung  Customer Service [facility]  P. [phone]  [email or web address]

## 2023-09-27 ENCOUNTER — Encounter (INDEPENDENT_AMBULATORY_CARE_PROVIDER_SITE_OTHER): Payer: Self-pay | Admitting: Internal Medicine

## 2023-09-27 ENCOUNTER — Other Ambulatory Visit (INDEPENDENT_AMBULATORY_CARE_PROVIDER_SITE_OTHER): Payer: Self-pay | Admitting: Internal Medicine

## 2023-09-27 ENCOUNTER — Telehealth: Payer: Self-pay

## 2023-09-27 ENCOUNTER — Ambulatory Visit (INDEPENDENT_AMBULATORY_CARE_PROVIDER_SITE_OTHER): Payer: Self-pay

## 2023-09-27 ENCOUNTER — Ambulatory Visit (INDEPENDENT_AMBULATORY_CARE_PROVIDER_SITE_OTHER): Payer: Medicare Other | Admitting: Internal Medicine

## 2023-09-27 VITALS — BP 120/77 | HR 115 | Temp 98.3°F | Ht 65.0 in | Wt 183.6 lb

## 2023-09-27 DIAGNOSIS — K5900 Constipation, unspecified: Secondary | ICD-10-CM

## 2023-09-27 DIAGNOSIS — J439 Emphysema, unspecified: Secondary | ICD-10-CM

## 2023-09-27 DIAGNOSIS — I5022 Chronic systolic (congestive) heart failure: Secondary | ICD-10-CM

## 2023-09-27 DIAGNOSIS — C801 Malignant (primary) neoplasm, unspecified: Secondary | ICD-10-CM

## 2023-09-27 DIAGNOSIS — Z6830 Body mass index (BMI) 30.0-30.9, adult: Secondary | ICD-10-CM

## 2023-09-27 DIAGNOSIS — E669 Obesity, unspecified: Secondary | ICD-10-CM

## 2023-09-27 DIAGNOSIS — E1169 Type 2 diabetes mellitus with other specified complication: Secondary | ICD-10-CM

## 2023-09-27 DIAGNOSIS — E162 Hypoglycemia, unspecified: Secondary | ICD-10-CM

## 2023-09-27 DIAGNOSIS — E119 Type 2 diabetes mellitus without complications: Secondary | ICD-10-CM

## 2023-09-27 DIAGNOSIS — I4892 Unspecified atrial flutter: Secondary | ICD-10-CM

## 2023-09-27 DIAGNOSIS — I480 Paroxysmal atrial fibrillation: Secondary | ICD-10-CM

## 2023-09-27 DIAGNOSIS — C7802 Secondary malignant neoplasm of left lung: Secondary | ICD-10-CM

## 2023-09-27 LAB — LAB USE ONLY - LAVENDER - EDTA HOLD TUBE

## 2023-09-27 LAB — LAB USE ONLY - GOLD SST HOLD TUBE

## 2023-09-27 LAB — POCT HEMOGLOBIN A1C: POCT Hgb A1C: 8.6 % — AB (ref 3.9–5.9)

## 2023-09-27 MED ORDER — GLIPIZIDE ER 10 MG PO TB24
10.0000 mg | ORAL_TABLET | Freq: Every day | ORAL | 5 refills | Status: DC
Start: 2023-09-27 — End: 2023-10-24

## 2023-09-27 MED ORDER — ONETOUCH ULTRA VI STRP
ORAL_STRIP | 3 refills | Status: DC
Start: 2023-09-27 — End: 2023-09-27

## 2023-09-27 MED ORDER — ONETOUCH VERIO VI STRP
ORAL_STRIP | 11 refills | Status: DC
Start: 2023-09-27 — End: 2023-09-27

## 2023-09-27 MED ORDER — ONETOUCH ULTRA VI STRP
ORAL_STRIP | 3 refills | Status: AC
Start: 2023-09-27 — End: ?

## 2023-09-27 NOTE — Telephone Encounter (Signed)
 Copied from CRM #[id number]. Topic: Clinical Support - Prescription Refill  >> Sep 27, 2023 10:52 AM Damien NOVAK wrote:  [facility], Saketh EUGENE called about Clinical Support - Prescription Refill.  Additional details:  Pharmacy called in regarding sig needing to be redone on Rx OneTouch Ultra test strip. Needs specific instructions (how many times PT is to test daily) before pharmacy can fill. Please advise.

## 2023-09-27 NOTE — Progress Notes (Addendum)
 September 27, 2023    INR Result:  Lab Results   Component Value Date    INR 7.2 (HH) 09/26/2023     via lab draw, 5.7 via home POCT      Confirmed patient held warfarin dose yesterday (01/21). Instructed patient to HOLD warfarin dose again today (09/27/23) and recheck INR tomorrow (09/28/23) via lab draw (patient will also check on home monitor for correlation). [facility] will follow up with patient again tomorrow.     Patient verbalized understanding of instructions provided.  Interview conducted via telephone.    Wanda Sheriff, PharmD, BCPS  Clinical Pharmacy Specialist  [facility]  Ph:  [phone]

## 2023-09-27 NOTE — Progress Notes (Signed)
 Subjective:      Patient ID: Carl [name] is a 83 y.o. male.    Chief Complaint:  Chief Complaint   Patient presents with    Hospital Follow-up     Pneumonia follow up              HPI   Here for follow-up from recent hospitalization.   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 He underwent bronchoscopy with Bx and pleurx drain placement.   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       Prior hospitalization within past 30 days: Yes   Prior hospitalization within past 6 months: No  Comorbid conditions: atrial fibrillation, CAD, CHF, COPD, diabetes, hypertension, and metastatic cancer  Medical procedure(s) performed:  bronchoscopy  with biopsy and pleurx drain placement  Surgical procedure(s) performed: none  Diagnostic tests performed: chest x-ray, CT chest, CT abdomen, and MRI brain Echo  Pending tests:  PET-CT scan  Home care: home health assisting with care  Specialist follow-up: cardiology and oncology  Post-hospital monitoring:  drain output, dressing changes, and anticoagulation monitoring  Mobility status: uses walker for assistance     Medication Reconciliation(required):  Home medications were reviewed and reconciled with current medication list within electronic medical record. Patient is adherent with current medication regimen.    Advanced  Directives:  Not addressed today.      POCT A1c was done -increased from prior to 8.6. He checks BG in morning and at hospital was elevated to 200's  Poor diet.  Son reports weight loss  Reports glipizide  was reduced to 5mg  daily by cardiologist  Ozempic  was not approved per previous discussion with Dr. DELENA and patient  Trulicity  paperwork in process    He also reports was discharged on lovenox  to coumadin  bridging. Lovenox  has been [place]'ed. His INR was supratherapeutic and coumadin  clinic was called. Patient has been holding coumadin  since 2 days.    Problem List[1]    Medications Taking[2]    Allergies[3]    Medical History[4]    Past Surgical History[5]    Family History[6]    Social History[7]      The following sections were reviewed this encounter by the provider:   Tobacco  Allergies  Meds  Problems  Med Hx  Surg Hx  Fam Hx          Review of Systems   Constitutional:  Positive for malaise/fatigue and weight loss.   HENT: Negative.     Eyes: Negative.    Respiratory:  Positive for shortness of breath. Negative for cough.    Cardiovascular: Negative.  Negative for chest pain and palpitations.   Gastrointestinal:  Positive for constipation. Negative for abdominal pain, nausea and vomiting.   Genitourinary:  Negative for dysuria, frequency and urgency.   Musculoskeletal: Negative.    Neurological: Negative.    Psychiatric/Behavioral: Negative.        Review of Systems   Constitutional:  Positive for malaise/fatigue and weight loss.   HENT: Negative.     Eyes: Negative.    Respiratory:  Positive for shortness of breath. Negative for cough.    Cardiovascular: Negative.  Negative for chest pain and palpitations.   Gastrointestinal:  Positive for constipation. Negative for abdominal pain, nausea and vomiting.   Genitourinary:  Negative for dysuria, frequency and urgency.   Musculoskeletal: Negative.    Neurological: Negative.    Psychiatric/Behavioral: Negative.         Vitals:  BP 120/77 (BP Site: Left arm,  Patient Position: Sitting, Cuff Size: Large)   Pulse (!) 115   Temp 98.3 F (36.8 C)   Ht 1.651 m (5' 5)   Wt 83.3 kg (183 lb 9.6 oz)   SpO2 95%   BMI 30.55 kg/m     Objective:     Physical Exam  Vitals reviewed.   Constitutional:       Appearance: He is obese.   HENT:      Head: Normocephalic.      Mouth/Throat:      Pharynx: Oropharynx is clear.   Eyes:      Pupils: Pupils are equal, round, and reactive to light.   Cardiovascular:      Rate and Rhythm: Tachycardia present.      Heart sounds: Normal heart sounds.   Pulmonary:      Effort: Pulmonary effort is normal. No respiratory distress.      Breath sounds: Normal breath sounds. No wheezing or rhonchi.   Abdominal:      General: Bowel sounds are normal.      Palpations: Abdomen is soft.   Skin:     General: Skin is warm.   Neurological:      General: No focal deficit present.      Mental Status: He is alert.   Psychiatric:         Mood and Affect: Mood normal.      Physical Exam  Vitals reviewed.   Constitutional:       Appearance: He is obese.   HENT:      Head: Normocephalic.      Mouth/Throat:      Pharynx: Oropharynx is clear.   Eyes:      Pupils: Pupils are equal, round, and reactive to light.   Cardiovascular:      Rate and Rhythm: Tachycardia present.      Heart sounds: Normal heart sounds.   Pulmonary:      Effort: Pulmonary effort is normal. No respiratory distress.      Breath sounds: Normal breath sounds. No wheezing or rhonchi.   Abdominal:      General: Bowel sounds are normal.      Palpations: Abdomen is soft.   Skin:     General: Skin is warm.   Neurological:      General: No focal deficit present.      Mental Status: He is alert.   Psychiatric:         Mood and Affect: Mood normal.         Assessment:     1. Type 2 diabetes mellitus with other specified complication, without long-term current use of insulin   - POCT Hemoglobin A1C elevated at 8.6( 7.3 prior)  -glipizide  dose increased to 10mg  daily  -maintain BG log with checking twice  daily and review at video visit in 2 weeks  -discussed alternate regimens to include trulicity /ozempic  /jardiance   - glipiZIDE  (GLUCOTROL ) 10 MG 24  hr tablet; Take 1 tablet (10 mg) by mouth daily  Dispense: 30 tablet; Refill: 5    2. Pulmonary emphysema, unspecified emphysema type    3. Chronic systolic congestive heart failure  -follow-up with cardiologist in feb  -c/w lasix  20mg  daily, toprol  100mg  daily and chlorthalidone  25 mg daily    4. PAF (paroxysmal atrial fibrillation)  -continue toprol  100mg  daily  -f/up with cardiology as scheduled    5. Supratherapeutic INR  7.2 yesterday  Discussed with coumadin  clinic, coumadin  on hold since 2 days  Patient to check INR daily and call coumadin  clinic  Goal INR 2-3    6. Obesity (BMI 30-39.9)  -diet and exercise as tolerated  -currently weak, working with PT    7. Metastatic squamous cell carcinoma involving left lung with unknown primary site, new   PET-CT  and molecular testing ordered per oncology      8. Constipation, unspecified constipation type  -miralax oTC    Plan:   Disease/Illness Education:  Patient educated regarding symptom management, adherence to current medication regimen and current plan of care. Patient encouraged to contact us  with any questions or needs.  Patient agreeable to current plan of care and verbalizes understanding of information provided.  Home Health Needs:  home health nursing   Community Service Needs:  DM education  Establishment of Referral Orders:  No referrals needed at this time.  Communication with Health Care Providers:  No active communication with additional health care providers needed at this time.  [place] 90+ day Treatment Plan:  Not applicable    Transitional Care Management Coding Criteria:  2) Face-to-face visit occurred within 7 days of discharge.    F/up with video visit in 2 weeks for DM.    Hadassah Begin, MD                    [1]   Patient Active Problem List  Diagnosis    Arteriosclerosis of coronary artery     Atrial flutter    Type 2 diabetes mellitus with other specified complication    Hyperlipidemia associated with type 2 diabetes mellitus    Hypertension associated with type 2 diabetes mellitus    Neuropathy due to type 2 diabetes mellitus    Obesity (BMI 30-39.9)    OSA (obstructive sleep apnea)    Subjective tinnitus of both ears    Melanoma in situ of face    Arthritis of both knees    Bilateral hip joint arthritis    Intrinsic eczema    Chronic cough    Chronic systolic congestive heart failure    Hypoxia    Metastatic squamous cell carcinoma involving lung with unknown primary site    Pulmonary emphysema, unspecified emphysema type    PAF (paroxysmal atrial fibrillation)   [2]   Outpatient Medications Marked as Taking for the 09/27/23 encounter (Office Visit) with Begin Hadassah, MD   Medication Sig Dispense Refill    albuterol  sulfate HFA (PROVENTIL ) 108 (90 Base) MCG/ACT inhaler Inhale 2 puffs into the lungs      allopurinol  (ZYLOPRIM ) 300 MG tablet Take 1 tablet (300 mg) by mouth daily 90 tablet 3    atorvastatin  (LIPITOR) 40 MG tablet Take 1 tablet (40 mg) by mouth daily 90 tablet 3    chlorthalidone  25 MG tablet Take 0.5 tablets (12.5 mg) by mouth daily 45 tablet 3    clobetasol (TEMOVATE) 0.05 % cream Apply topically as needed      fluticasone  furoate-vilanterol (  BREO ELLIPTA ) 200-25 MCG/ACT Aerosol Pwdr, Breath Activated Inhale 1 puff into the lungs daily 1 each 2    furosemide  (LASIX ) 20 MG tablet Take 1 tablet (20 mg) by mouth daily 180 tablet 3    Glucosamine-Chondroit-Vit C-Mn (GLUCOSAMINE 1500 COMPLEX PO) Take 2 tablets by mouth daily      isosorbide  mononitrate (IMDUR ) 30 MG 24 hr tablet Take 1 tablet (30 mg) by mouth daily 90 tablet 3    metFORMIN  (GLUCOPHAGE -XR) 500 MG 24 hr tablet Take 2 tablets (1,000 mg) by mouth 2 (two) times daily 360 tablet 0    metoprolol  succinate (TOPROL -XL) 100 MG 24 hr tablet Take 1 tablet (100 mg) by mouth daily 90 tablet 3    warfarin (COUMADIN ) 5 MG tablet Take 1.5  tablets (7.5 mg) by mouth every evening (Patient taking differently: Take 1.5 tablets (7.5 mg) by mouth every evening 1/21 and 1/22 holding dose) 135 tablet 2    [DISCONTINUED] glipiZIDE  (GLUCOTROL  XL) 5 MG 24 hr tablet Take 1 tablet (5 mg) by mouth daily 90 tablet 3   [3]   Allergies  Allergen Reactions    Cephalexin Rash   [4]   Past Medical History:  Diagnosis Date    Diabetes mellitus     Hyperlipidemia     Hypertension    [5]   Past Surgical History:  Procedure Laterality Date    CARDIAC SURGERY  2004    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    PLACEMENT, INDWELLING CATHETER PLEURAL Left 09/19/2023    Procedure: PLACEMENT, INDWELLING TUNNELED CATHETER, PLEURAL;  Surgeon: Maree Jinnie RAMAN, MD;  Location: [id number];  Service: Pulmonary;  Laterality: Left;  UNIVERSAL    SKIN BIOPSY      TONSILLECTOMY      ULTRASOUND, ENDOBRONCHIAL (EBUS) N/A 09/19/2023    Procedure: ULTRASOUND, ENDOBRONCHIAL (EBUS);  Surgeon: Maree Jinnie RAMAN, MD;  Location: [facility];  Service: Pulmonary;  Laterality: N/A;   [6]   Family History  Problem Relation Name Age of Onset    Lung cancer Brother     [7]   Social History  Tobacco Use    Smoking status: Former     Types: Cigarettes     Passive exposure: Past    Tobacco comments:     Quit smoking June 1989, smoked 30 years before   Vaping Use    Vaping status: Never Used   Substance Use Topics    Alcohol use: Not Currently    Drug use: Not Currently

## 2023-09-27 NOTE — Telephone Encounter (Signed)
 [facility] SW CASE MANAGEMENT NOTE  09/27/2023  Patient: Carl [name], Carl [name]  83 y.o., male)    SW received new referral for thoracic pt. For hospital bed DME.     SW requested order to be placed for hospital bed DME in Epic and to indicate responses to required questions. SW created SmartPhrase to assist and shared with RN for ease and clarity of ordering.       ICD 10 code of pt.s diagnosis/es requiring hospital bed -     Why does the patient require a hospital bed? : (select one)  Traction equipment, positioning not possible with an ordinary bed (due to CHF, chronic pulmonary disease, problems with aspiration, or another medical condition) most common, alleviate pain, none of these.     Which positions are required: (can select multiple) 30-45 (Most common) , 45-60, 60-90 degree head of the bed elevation, Orthopedic positioning (leg elevation), positioning for functional eight shifts, other positioning (list) ___.     Which conditions require this positioning? : CHF, chronic pulmonary disease, problems with aspiration, or another medical condition.    How severe is the pt.'s condition or pain? :  severe/moderate/mild     How long has the patient suffered from their condition or pain?  : 2 years, 1 year, 6 months, 3 months, other (fill in)     How has the patient's condition or pain progressed over time? : Getting worse, stable, intermittent, other (fill in) .     Why does their condition necessitate a hospital bed?:  (You can indicate mx reasons):  SOB, chest pressure, Prevention of ulcers, muscle weakness, lower extremity edema, pain, inability to move independently, other.     What kind of changes in body position does the pt. need? :  Frequent, immediate, frequent and immediate, neither frequent nor immediate    Why must position changes be achieved frequently and or immediately? : (can select multiple) treat bed sores, treat skin break down, treat muscle contractures, alleviate pressure, alleviate pain,  alleviate orthopnea, assist with breathing, assist w swallowing difficulties, assist caretaker in bathing, dressing, or feeding the patient, assist caretaker in completing exams or procedures, other (fill in) ?    Per RN she will review and respond to SW as available. SW initiated order and is following awaiting reply of required information.       Lorrayne Nap, LCSW  [facility] Social Work Case Production Designer, Theatre/television/film   For Head & Neck Cancers, Counsellor,   Hematology, & Endocrine  [facility]: [phone]  F: [phone]  E: [email or web address]

## 2023-09-27 NOTE — Progress Notes (Deleted)
 Subjective:      Date: 09/27/2023 10:10 AM   Patient ID: Carl [name] is a 83 y.o. male.    {Vanishing Tip Click a link below to be taken to that activity or part of the chart   Chart Review  Order Review  Review Flowsheets  Labs  Health Maintenance  Immunizations  Allergies  Medications  Problem List  History :[id number]}    Chief Complaint:  Chief Complaint   Patient presents with    Hospital Follow-up     Pneumonia follow up       INR 7.2    miralax    HPI:  HPI    Problem List:  Problem List[1]    Current Medications:  Current Medications[2]    Allergies:  Allergies[3]    Past Medical History:  Medical History[4]    Past Surgical History:  Past Surgical History[5]    Family History:  Family History[6]    Social History:  Social History[7]      The following sections were reviewed this encounter by the provider:   Tobacco  Allergies  Meds  Problems  Med Hx  Surg Hx  Fam Hx         ROS:  Review of Systems   Constitutional:  Positive for malaise/fatigue and weight loss.   HENT: Negative.     Eyes: Negative.    Respiratory:  Positive for shortness of breath. Negative for cough.    Cardiovascular: Negative.  Negative for chest pain and palpitations.   Gastrointestinal:  Positive for constipation. Negative for abdominal pain, nausea and vomiting.   Genitourinary:  Negative for dysuria, frequency and urgency.   Musculoskeletal: Negative.    Neurological: Negative.    Psychiatric/Behavioral: Negative.         Objective:     Vitals:  BP 120/77 (BP Site: Left arm, Patient Position: Sitting, Cuff Size: Large)   Pulse (!) 115   Temp 98.3 F (36.8 C)   Ht 1.651 m (5' 5)   Wt 83.3 kg (183 lb 9.6 oz)   SpO2 95%   BMI 30.55 kg/m     Physical Exam:  Physical Exam  Constitutional:       Appearance: Normal appearance.   HENT:      Head: Normocephalic.      Mouth/Throat:      Pharynx: Oropharynx is clear.   Eyes:      Pupils: Pupils are equal, round, and reactive to light.   Cardiovascular:      Rate  and Rhythm: Tachycardia present.   Pulmonary:      Effort: No respiratory distress.      Breath sounds: Normal breath sounds. No stridor.      Comments: Left side pig tail catheter in place  Abdominal:      General: Bowel sounds are normal.      Palpations: Abdomen is soft.   Musculoskeletal:      Cervical back: Normal range of motion.   Skin:     General: Skin is warm.   Neurological:      General: No focal deficit present.      Mental Status: He is alert.   Psychiatric:         Mood and Affect: Mood normal.         Assessment/Plan:       1. Type 2 diabetes mellitus with other specified complication, without long-term current use of insulin   - POCT Hemoglobin A1C  - glipiZIDE  (GLUCOTROL ) 10  MG 24 hr tablet; Take 1 tablet (10 mg) by mouth daily  Dispense: 30 tablet; Refill: 5    2. Pulmonary emphysema, unspecified emphysema type    3. Chronic systolic congestive heart failure    4. PAF (paroxysmal atrial fibrillation)    5. Hypoglycemia    6. Obesity (BMI 30-39.9)    7. Metastatic squamous cell carcinoma involving left lung with unknown primary site    8. Constipation, unspecified constipation type      No follow-ups on file.    Hadassah Begin, MD          [1]   Patient Active Problem List  Diagnosis    Arteriosclerosis of coronary artery    Atrial flutter    Type 2 diabetes mellitus with other specified complication    Hyperlipidemia associated with type 2 diabetes mellitus    Hypertension associated with type 2 diabetes mellitus    Neuropathy due to type 2 diabetes mellitus    Obesity (BMI 30-39.9)    OSA (obstructive sleep apnea)    Subjective tinnitus of both ears    Melanoma in situ of face    Arthritis of both knees    Bilateral hip joint arthritis    Intrinsic eczema    Chronic cough    Chronic systolic congestive heart failure    Hypoxia    Metastatic squamous cell carcinoma involving lung with unknown primary site    Pulmonary emphysema, unspecified emphysema type    PAF (paroxysmal atrial fibrillation)   [2]    Current Outpatient Medications   Medication Sig Dispense Refill    albuterol  sulfate HFA (PROVENTIL ) 108 (90 Base) MCG/ACT inhaler Inhale 2 puffs into the lungs      allopurinol  (ZYLOPRIM ) 300 MG tablet Take 1 tablet (300 mg) by mouth daily 90 tablet 3    atorvastatin  (LIPITOR) 40 MG tablet Take 1 tablet (40 mg) by mouth daily 90 tablet 3    chlorthalidone  25 MG tablet Take 0.5 tablets (12.5 mg) by mouth daily 45 tablet 3    clobetasol (TEMOVATE) 0.05 % cream Apply topically as needed      fluticasone  furoate-vilanterol (BREO ELLIPTA ) 200-25 MCG/ACT Aerosol Pwdr, Breath Activated Inhale 1 puff into the lungs daily 1 each 2    furosemide  (LASIX ) 20 MG tablet Take 1 tablet (20 mg) by mouth daily 180 tablet 3    Glucosamine-Chondroit-Vit C-Mn (GLUCOSAMINE 1500 COMPLEX PO) Take 2 tablets by mouth daily      isosorbide  mononitrate (IMDUR ) 30 MG 24 hr tablet Take 1 tablet (30 mg) by mouth daily 90 tablet 3    metFORMIN  (GLUCOPHAGE -XR) 500 MG 24 hr tablet Take 2 tablets (1,000 mg) by mouth 2 (two) times daily 360 tablet 0    metoprolol  succinate (TOPROL -XL) 100 MG 24 hr tablet Take 1 tablet (100 mg) by mouth daily 90 tablet 3    warfarin (COUMADIN ) 5 MG tablet Take 1.5 tablets (7.5 mg) by mouth every evening (Patient taking differently: Take 1.5 tablets (7.5 mg) by mouth every evening 1/21 and 1/22 holding dose) 135 tablet 2    enoxaparin  (LOVENOX ) 100 MG/ML syringe Inject 0.85 mLs (85 mg) into the skin every 12 (twelve) hours for 7 days 11.9 mL 0    enoxaparin  (LOVENOX ) 80 MG/0.8ML syringe Inject 0.8 mLs (80 mg) into the skin      glipiZIDE  (GLUCOTROL ) 10 MG 24 hr tablet Take 1 tablet (10 mg) by mouth daily 30 tablet 5    OneTouch Verio test strip Check  blood sugar 7 times daily, as directed by MD. 200 each 11    Vitamin D, Cholecalciferol, 50 MCG (2000 UT) Cap Take 2,000 Units by mouth daily (Patient not taking: Reported on 09/27/2023)       No current facility-administered medications for this visit.      Facility-Administered Medications Ordered in Other Visits   Medication Dose Route Frequency Provider Last Rate Last Admin    0.9% NaCl infusion   Intravenous Continuous PRN Posada Londono, Adriana M, MD   New Bag at 09/19/23 1345    phenylephrine  (NEOSYNEPHRINE) 10 mcg/mL pediatric IV syringe   Intravenous PRN Posada Londono, Adriana M, MD   100 mcg at 09/19/23 1459    propofol  (DIPRIVAN ) 10 mg/mL infusion (ADULT)   Intravenous Continuous PRN Posada Londono, Adriana M, MD   Stopped at 09/19/23 1445    propofol  (DIPRIVAN ) injection   Intravenous PRN Posada Londono, Adriana M, MD   20 mg at 09/19/23 1452   [3]   Allergies  Allergen Reactions    Cephalexin Rash   [4]   Past Medical History:  Diagnosis Date    Diabetes mellitus     Hyperlipidemia     Hypertension    [5]   Past Surgical History:  Procedure Laterality Date    CARDIAC SURGERY  2004    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    PLACEMENT, INDWELLING CATHETER PLEURAL Left 09/19/2023    Procedure: PLACEMENT, INDWELLING TUNNELED CATHETER, PLEURAL;  Surgeon: Maree Jinnie RAMAN, MD;  Location: [id number];  Service: Pulmonary;  Laterality: Left;  UNIVERSAL    SKIN BIOPSY      TONSILLECTOMY      ULTRASOUND, ENDOBRONCHIAL (EBUS) N/A 09/19/2023    Procedure: ULTRASOUND, ENDOBRONCHIAL (EBUS);  Surgeon: Maree Jinnie RAMAN, MD;  Location: [facility];  Service: Pulmonary;  Laterality: N/A;   [6]   Family History  Problem Relation Name Age of Onset    Lung cancer Brother     [7]   Social History  Tobacco Use    Smoking status: Former     Types: Cigarettes     Passive exposure: Past    Tobacco comments:     Quit smoking June 1989, smoked 30 years before   Vaping Use    Vaping status: Never Used   Substance Use Topics    Alcohol use: Not Currently    Drug use: Not Currently

## 2023-09-27 NOTE — Progress Notes (Signed)
 Have you seen any specialists since your last visit with [place]?  No      The patient was informed that the following HM items are still outstanding:   eye exam and awv, microalbumin

## 2023-09-28 ENCOUNTER — Telehealth: Payer: Self-pay

## 2023-09-28 ENCOUNTER — Other Ambulatory Visit: Payer: Medicare Other

## 2023-09-28 DIAGNOSIS — I4892 Unspecified atrial flutter: Secondary | ICD-10-CM

## 2023-09-28 LAB — MEDICAL CYTOLOGY

## 2023-09-28 LAB — PT/INR
INR: 4.7 (ref 0.9–1.1)
PT: 52.5 s — ABNORMAL HIGH (ref 10.1–12.9)

## 2023-09-28 NOTE — Telephone Encounter (Signed)
 [facility] SW CASE MANAGEMENT NOTE  09/28/2023  Patient: FELICIA, BOTH  83 y.o., male)    SW f/u with RN regarding bed order and offered additional support for bed ordering questions. Per Rn she will f/u with this SW if additional support is needed.     Lorrayne Nap, LCSW  [facility] Social Work Case Production Designer, Theatre/television/film   For Head & Neck Cancers, Counsellor,   Hematology, & Endocrine  [facility]: [phone]  F: [phone]  E: [email or web address]

## 2023-09-29 ENCOUNTER — Telehealth: Payer: Self-pay

## 2023-09-29 ENCOUNTER — Other Ambulatory Visit: Payer: Self-pay | Admitting: Pulmonary Disease

## 2023-09-29 ENCOUNTER — Ambulatory Visit (INDEPENDENT_AMBULATORY_CARE_PROVIDER_SITE_OTHER): Payer: Self-pay

## 2023-09-29 DIAGNOSIS — I4892 Unspecified atrial flutter: Secondary | ICD-10-CM

## 2023-09-29 LAB — EXTERNAL PT/INR: PT INR: 3.6 — AB (ref 0.9–1.1)

## 2023-09-29 NOTE — Telephone Encounter (Signed)
 [facility] SW CASE MANAGEMENT NOTE  09/29/2023  Patient: Carl [name], Carl [name]  83 y.o., male)    SW received response from RN Hanika that pt. Note is completed for order.     SW reviewed and finalized order for pt. Hospital bed and is following for any issues with order or delivery.   SW called pt. son to notify of order placed and to watch out for call regarding delivery.       Lorrayne Nap, LCSW  [facility] Social Work Case Production Designer, Theatre/television/film   For Head & Neck Cancers, Counsellor,   Hematology, & Endocrine  [facility] at [facility]: [phone]  F: [phone]  E: [email or web address]

## 2023-09-29 NOTE — Progress Notes (Signed)
 September 29, 2023    INR Goal:  2-3    Warfarin Indication: Paroxysmal AFib/AFlutter     CHA2DS2-VASc Score:  5 HTN (1), Age >/=75 (2), DM (1), and Vascular Dx (1)  HASBLED Score:  1 Elderly (Age>65)    Referring Provider: Dr. Lamar Platts     Referral Date:  09/06/22    Last visit with referring provider:  08/09/23    PMH:    Past Medical History:   Diagnosis Date    Diabetes mellitus     Hyperlipidemia     Hypertension      Current warfarin dose:    Instructed Dose Reported Dose   Hospitalized 1/5 to 1/15 - warfarin doses in hospital updated [facility] Track  HOLD warfarin 01/21, 01/22, 01/23 for supratherapeutic INR  No deviation from instructed dose.  Denies missed or extras dose(s).    7-day warfarin total: 22.5 mg 7-day warfarin total: 22.5 mg      Lab Results   Component Value Date    INR 3.6 (A) 09/29/2023    INR 4.7 09/28/2023    INR 7.2 (HH) 09/26/2023    INR 5.7 (A) 09/26/2023    INR 6.0 (A) 09/22/2023    INR 1.6 (A) 09/22/2023     Dietary intake of vitamin K: Inconsistent. Typically 4-5 servings of green beans and salad (Spring mix, carrots, tomatoes, onions) per week. May have 1 serving of high vitamin K vegetables (collard greens) once monthly. Patient is in assisted living facility and eats whatever is served at the facility. Reports he hasn't been eating much of anything since hospital discharge due to his reduced appetite. No green vegetables at all since last [facility] encounter.     Alcohol consumption: No alcohol consumption since 1989.      Tobacco use: Former, quit in 1989      Medication changes:  No changes.     New Medication/Dietary/Herbal Interactions: N/A    Concurrent significant interacting medications/supplements:    Medications that may enhance anticoagulant effect or bleeding risks:  APAP (patient takes prn <2000 mg/day)  Allopurinol   Glucosamine  Omega-3 fatty acids  Medications that may diminish anticoagulant effect:  Vitamin C  Metformin   MVI ([employer] brand, vitamin K 25 mcg)- reports today that  he has resumed     S/sx of bleeding/clotting:  Denies red or dark brown urine, red or black, tarry stool, vomiting or coughing up blood, unexplained bruising, bleeding that doesn't stop or is very heavy.     Recent Falls: Denies.   Patient uses cane to assist with mobility.      Upcoming procedures/surgeries: Nothing scheduled at this time.     Is patient a candidate for DOAC?  Yes    If yes, date and outcome of discussion: 10/06/22: Per pharmacy liaison, copay for Eliquis is $771.30/90 days (periodic deductible $545+coinsurance $226.30). Once deductible met, Eliquis would be ~$75/month. Patient has Medicare and not eligible for copay card.  Patient prefers to continue warfarin for now.     Others: Hospitalized 1/5 to 1/15 for PNA with effusion. Pleurx drain placed and biopsy. Pt reports feeling better since hospital discharge. No abx at this time.   Instructed pt to check INR in the morning M-Th.  INR received after 3pm will be addressed the following business day.  Instructed patient to call [facility] if no call received by 3PM the day after INR testing.     Best Contact:  Best time to reach patient is 8a-3pm.  INR Result:  Lab Results   Component Value Date    INR 3.6 (A) 09/29/2023     via home POCT   =======================================    A/P:  INR 3.6 remains above therapeutic range. High INR possibly due to reduced Vit K intake in diet/stopping MVI with Vit K. Of note, pt had been in range with TWD of 45 mg/wk a few weeks ago. Also noted that patient's INR jumped to 6.0 with only 5 mg x2 days.   Based on recent supratherapeutic INRs, will reduce dose for the next few days  Warfarin 2.5 mg daily until recheck on 10/01/22 via Home POCT   Instructed pt to go to ED if they experience red or dark brown urine, red or black, tarry stool, vomiting or coughing up blood, unexplained bruising, bleeding that doesn't stop or is very heavy.     Warfarin has been managed per Clinic Protocol    Patient verbalized  understanding of instructions provided.  Interview conducted via telephone.    Bernardino Robin, PharmD, [facility]  Clinical Pharmacy Specialist, Anticoagulation  Ph: [phone]

## 2023-09-29 NOTE — Telephone Encounter (Signed)
 Hospital bed necessity    ICD 10 code of pt.s diagnosis/es requiring hospital bed - C78.0; C80.1    Why does the patient require a hospital bed? 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     Which conditions require this positioning? : shortness of breath due to pleural effusion,   CHF, chronic pulmonary disease, problems with aspiration, or another medical condition.    How severe is the pt.'s condition or pain? :  severe/moderate/mild     How long has the patient suffered from their condition or pain?  : 2 years, 1 year, 6 months, 3 months, other (fill in)     How has the patient's condition or pain progressed over time? : Getting worse, stable, intermittent, other (fill in) .     Why does their condition necessitate a hospital bed?:  (You can indicate mx reasons):  Difficulty with moving with activities due to weakness and has been mostly sedentary since diagnosis, Prevention of ulcers,SOB due to pleural effusion, Inability to lay flat due to shortness of breath    What kind of changes in body position does the pt. need? :  Frequent, immediate, frequent and immediate, neither frequent nor immediate    Why must position changes be achieved frequently and or immediately? : (can select multiple) treat bed sores, treat skin break down, treat muscle contractures, alleviate pressure, alleviate pain, alleviate orthopnea, assist with breathing, enhance safety measures and prevent falling or injury, assist w swallowing difficulties, assist caretaker in bathing, dressing, or feeding the patient, assist caretaker in completing exams or procedures, other (fill in) ?

## 2023-09-29 NOTE — Telephone Encounter (Signed)
 Spoke with the patient son Gene, according to him, patient received a home health pleurx care for a couple of times already and patient is doing well.

## 2023-09-30 NOTE — Provider Clarification Note (Signed)
 Patient Name: Carl [name], Carl [name]  Account #: [id number]   MR #: [id number]  Discharge Date: 09/20/2023        Dear Dr. Madelyn and NP Hobson,     Sepsis is documented in the ED note on 01/05. Further clarification of this diagnosis and the supporting clinical indicators/clinical evidence/clinical decision making associated with this diagnosis is requested.    History/Risk Factors: 83 year old male presented to hospital on 1/5 with dyspnea, found to have hypoxic respiratory failure secondary to left lung pneumonia and left lung effusion.    Clinical Indicators:  ED: Critical Diagnosis:  1. Pneumonia of left lower lobe due to infectious organism  2. Hypoxia  3. Sepsis, due to unspecified organism, unspecified whether acute organ dysfunction present    ED: Sepsis was suspected upon patient's arrival related to his presentation his lactic acid level was elevated at 3.21 blood culture was obtained. Patient CBC does show a white blood cell count of 15.16.     Triage VS:  Pulse 68  BP (!) 174/94  Resp 19  SpO2 95 %  Temp 98.7 F (37.1 C)     Treatment: Considered giving him 500 mL of normal saline however IV fluids were held related to his history of CHF with concern for acute exacerbation.  Patient received cefepime  as well as vancomycin  in the ED which he tolerated well.     Question: Please clarify if sepsis is a valid diagnosis for the patient and, if so, provide the clinical support for this diagnosis.           -- Yes, sepsis is present as evidenced by (include additional clinical support for this diagnosis in your response)    -- No, sepsis is ruled out    -- Other (please specify diagnosis)       Thank you,    Trula Few, MD, CDIP  Clinical Documentation Integrity Specialist II  Office: [phone]  Email: [email or web address]  Date:  09/26/2023        PROVIDER RESPONSE (Choose from list above or add free text): sepsis was removed from the diagnosis list

## 2023-10-02 ENCOUNTER — Telehealth: Payer: Self-pay

## 2023-10-02 ENCOUNTER — Ambulatory Visit (INDEPENDENT_AMBULATORY_CARE_PROVIDER_SITE_OTHER): Payer: Self-pay

## 2023-10-02 DIAGNOSIS — I4892 Unspecified atrial flutter: Secondary | ICD-10-CM

## 2023-10-02 LAB — EXTERNAL PT/INR: PT INR: 3.3 — AB (ref 0.9–1.1)

## 2023-10-02 NOTE — Telephone Encounter (Signed)
 [facility] SW CASE MANAGEMENT NOTE  10/02/2023  Patient: Carl [name], Carl [name]  [age] y.o., male)    SW reviewed DME order for pt.'s hospital bed and bed order is still being reviewed nothing additional needed at this time. SW following.         Lorrayne Nap, LCSW  [facility] Social Work Case Production Designer, Theatre/television/film   For [facility] Neck C[facility], Counsellor,   Hematology, & Endocrine  [facility]: [phone]  F: [phone]  E: [email or web address]

## 2023-10-02 NOTE — Progress Notes (Signed)
 October 02, 2023    INR Goal:  2-3    Warfarin Indication: Paroxysmal AFib/AFlutter     CHA2DS2-VASc Score:  5 HTN (1), Age >/=75 (2), DM (1), and Vascular Dx (1)  HASBLED Score:  1 Elderly (Age>65)    Referring Provider: Dr. Lamar Platts     Referral Date:  09/06/22    Last visit with referring provider:  08/09/23    PMH:    Past Medical History:   Diagnosis Date    Diabetes mellitus     Hyperlipidemia     Hypertension      Current warfarin dose:    Instructed Dose Reported Dose   Hospitalized 1/5 to 1/15 - warfarin doses in hospital updated [facility]  HOLD warfarin 01/21, 01/22, 01/23 for supratherapeutic INR  Warfarin 2.5mg  daily until re-check inr 10/02/23  No deviation from instructed dose.  Denies missed or extras dose(s).    7-day warfarin total: 15 mg 7-day warfarin total: 15 mg      Lab Results   Component Value Date    INR 3.3 (A) 10/02/2023    INR 3.6 (A) 09/29/2023    INR 4.7 09/28/2023    INR 7.2 (HH) 09/26/2023    INR 5.7 (A) 09/26/2023    INR 6.0 (A) 09/22/2023    INR 1.6 (A) 09/22/2023     Dietary intake of vitamin K:  Typically 4-5 servings of green beans and salad (Spring mix, carrots, tomatoes, onions) per week. May have 1 serving of high vitamin K vegetables (collard greens) once monthly. Patient is in assisted living facility and eats whatever is served at the facility. No reported changes since last outreach.     Alcohol consumption: No alcohol consumption since 1989.      Tobacco use: Former, quit in 1989      Medication changes:  No changes.     New Medication/Dietary/Herbal Interactions: N/A    Concurrent significant interacting medications/supplements:    Medications that may enhance anticoagulant effect or bleeding risks:  APAP (patient takes prn <2000 mg/day)  Allopurinol   Glucosamine  Omega-3 fatty acids  Medications that may diminish anticoagulant effect:  Vitamin C  Metformin   MVI ([employer] brand, vitamin K 25 mcg)- (has resumed since d/c from hospital 09/20/23)     S/sx of  bleeding/clotting:  Denies red or dark brown urine, red or black, tarry stool, vomiting or coughing up blood, unexplained bruising, bleeding that doesn't stop or is very heavy.     Recent Falls: Denies.   Patient uses cane to assist with mobility.      Upcoming procedures/surgeries: Nothing scheduled at this time.     Is patient a candidate for DOAC?  Yes    If yes, date and outcome of discussion: 10/06/22: Per pharmacy liaison, copay for Eliquis is $771.30/90 days (periodic deductible $545+coinsurance $226.30). Once deductible met, Eliquis would be ~$75/month. Patient has Medicare and not eligible for copay card.  Patient prefers to continue warfarin for now.     Others: Hospitalized 1/5 to 1/15 for PNA with effusion. Pleurx drain placed and biopsy. Pt reports feeling better since hospital discharge. No abx at this time.   Instructed pt to check INR in the morning M-Th.  INR received after 3pm will be addressed the following business day.  Instructed patient to call [facility] if no call received by 3PM the day after INR testing.     Best Contact:  Best time to reach patient is 8a-3pm.    INR  Result:  Lab Results   Component Value Date    INR 3.3 (A) 10/02/2023     via home POCT   =======================================    A/P:  INR 3.3 remains above therapeutic range. High INR possibly due to reduced Vit K intake in diet and need of dose schedule change.   Of note, pt had been in range with TWD of 45 mg/wk a few weeks ago. Also noted that patient's INR jumped to 6.0 with only 5 mg x2 days.   Based on supratherapeutic INRs over the last month and recent reintroduction of Vit K supplements, will reduce total weekly dose to 17.5mg .  Warfarin 2.5 mg daily until recheck on 10/05/22 via Home POCT  Instructed pt to go to ED if they experience red or dark brown urine, red or black, tarry stool, vomiting or coughing up blood, unexplained bruising, bleeding that doesn't stop or is very heavy.     Warfarin has been managed per  Clinic Protocol    Patient verbalized understanding of instructions provided.  Interview conducted via telephone.      Alpha Camp, PharmD  Pharmacy Clinical Specialist  [facility], Ambulatory Care Management Team  [address]  [place], [place] [address]  [place] [phone]    [facility]  Phone:  [phone]

## 2023-10-02 NOTE — Telephone Encounter (Signed)
Rochelle from [facility] called and reported that the Pleurx drainage today is less than 10 ml. Will check again on Wednesday and patient is scheduled to be seen in the clinic on Thursday 1/30.

## 2023-10-05 ENCOUNTER — Telehealth: Payer: Self-pay

## 2023-10-05 ENCOUNTER — Encounter: Payer: Self-pay | Admitting: Pulmonary Disease

## 2023-10-05 ENCOUNTER — Ambulatory Visit: Payer: Medicare Other | Admitting: Pulmonary Disease

## 2023-10-05 ENCOUNTER — Ambulatory Visit
Admission: RE | Admit: 2023-10-05 | Discharge: 2023-10-05 | Disposition: A | Discharge status: Home / Self Care | Payer: Medicare Other | Source: Ambulatory Visit | Attending: Pulmonary Disease | Admitting: Pulmonary Disease

## 2023-10-05 ENCOUNTER — Ambulatory Visit: Payer: Medicare Other

## 2023-10-05 ENCOUNTER — Inpatient Hospital Stay (INDEPENDENT_AMBULATORY_CARE_PROVIDER_SITE_OTHER): Payer: Medicare Other | Admitting: Internal Medicine

## 2023-10-05 VITALS — BP 123/76 | HR 119 | Temp 96.9°F | Resp 17 | Ht 65.0 in | Wt 179.4 lb

## 2023-10-05 DIAGNOSIS — I10 Essential (primary) hypertension: Secondary | ICD-10-CM | POA: Insufficient documentation

## 2023-10-05 DIAGNOSIS — E119 Type 2 diabetes mellitus without complications: Secondary | ICD-10-CM | POA: Insufficient documentation

## 2023-10-05 DIAGNOSIS — I2510 Atherosclerotic heart disease of native coronary artery without angina pectoris: Secondary | ICD-10-CM | POA: Insufficient documentation

## 2023-10-05 DIAGNOSIS — G4733 Obstructive sleep apnea (adult) (pediatric): Secondary | ICD-10-CM | POA: Insufficient documentation

## 2023-10-05 DIAGNOSIS — J910 Malignant pleural effusion: Secondary | ICD-10-CM | POA: Insufficient documentation

## 2023-10-05 DIAGNOSIS — K219 Gastro-esophageal reflux disease without esophagitis: Secondary | ICD-10-CM | POA: Insufficient documentation

## 2023-10-05 DIAGNOSIS — J90 Pleural effusion, not elsewhere classified: Secondary | ICD-10-CM

## 2023-10-05 DIAGNOSIS — E785 Hyperlipidemia, unspecified: Secondary | ICD-10-CM | POA: Insufficient documentation

## 2023-10-05 DIAGNOSIS — I480 Paroxysmal atrial fibrillation: Secondary | ICD-10-CM | POA: Insufficient documentation

## 2023-10-05 DIAGNOSIS — Z7901 Long term (current) use of anticoagulants: Secondary | ICD-10-CM | POA: Insufficient documentation

## 2023-10-05 NOTE — Progress Notes (Signed)
 Interventional Pulmonology Consultation    Name: Carl [name]  MRN: [id number]  Date: 10/05/2023  ?  Reason for consultation: PleurX placement, stitch removal  ?  HPI:     Carl [name] is a 83 y.o. male, former smoker, with a history of COPD, chronic emphysema, CAD, paroxysmal A-fib on warfarin, OSA, GERD, hypertension, hyperlipidemia, diabetes who presented to hospital on 09/10/23 with dyspnea, found to have hypoxic respiratory failure secondary to left lung pneumonia and left lung effusion. 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     Today patient reports he feels fatigue and weakness. He is is struggling getting his strength, occasional dry/productive cough yellow and clear sputum. Reports unable to sleep flat at night because he feels he can't catch his breathe. Reports last two drainages had minimal output, prior to that it was 250 cc.     Accompanied by son and daughter in law.  Pulmonary History   See above    Review of Systems:   Complete ROS was negative aside from pertinent positives and negatives noted in HPI above.     Past Medical/Surgical History:   Medical History[1]    Past Surgical History[2]    Family History:   Family History[3]    Social History:   Social History[4]  Medications:   Current Medications[5]       Vitals:     Vitals:    10/05/23 1309   BP: 123/76   Pulse: (!) 119   Resp: 17   Temp: (!) 96.9 F (36.1 C)   SpO2: 96%     Body mass index is 29.85 kg/m.    Physical Exam:   General Appearance: No acute distress  ENT: oropharynx appears normal  Neck: normal ROM  Lungs: normal respiratory effort, satting 96% on RA  Cardiac: RRR  Abdomen: non distended  Neuro: alert and oriented x 3  Psychiatric: Normal affect and mood  Skin: warm, dry  Extremities:  palpable pulses    Laboratory Results:     Lab Results   Component Value Date    WBC 12.69 (H) 09/19/2023    HGB 14.1 09/19/2023    HCT 43.3 09/19/2023    MCV  92.3 09/19/2023    PLT 327 09/19/2023     Lab Results   Component Value Date    NA 138 09/19/2023    K 3.8 09/19/2023    CL 103 09/19/2023    CO2 27 09/19/2023    BUN 25 09/19/2023    CREAT 0.8 09/19/2023    GLU 169 (H) 09/19/2023    CA 10.0 09/19/2023    MG 2.0 09/19/2023    PHOS 2.9 09/12/2023     Lab Results   Component Value Date    PT 52.5 (H) 09/28/2023    PTT 33 09/15/2023    INR 3.3 (A) 10/02/2023     Pathology:           Imaging:   CT images, data reviewed.    PET/CT (09/28/23):        Assessment and Plan:   Carl [name] is a 83 y.o. 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  Former smoker  COPD  chronic emphysema  CAD  paroxysmal A-fib on warfarin  OSA  GERD  Hypertension  Hyperlipidemia  diabetes     Patient presents to clinic today with his  son as well as daughter-in-law.  Patient endorses dyspnea on minimal exertion as well as orthopnea.  They state that his output from the tunneled pleural catheter has been minimal the past 2 drainages.  We attempted to drain the tunneled pleural catheter in clinic after flushing the catheter without any output.  Point-of-care ultrasound done today reveals small left-sided pleural effusion posteriorly.  Based on patient's PET scan there is concern of significant pleural thickening as well as PET uptake suggestive of pleural-based disease.  I discussed with the patient and the family that his pleural effusion may not be the significant culprit behind his dyspnea and we would be limited from a procedural standpoint to improve his respiratory status.  He still awaiting initiation of treatment.  He is not on supplemental oxygen at home.  Unclear if his COPD is also contributing to his dyspnea.      Recommendations:   -Removed patient's tunneled pleural catheter stitches in clinic today.  -Flushed patient's tunneled pleural catheter and attempted drainage with minimal  output.  -Will obtain chest x-ray today.  -Will arrange for interventional pulmonology clinic follow-up in 4 weeks to assess for Pleurx removal (pending initiation of treatment)  -Obtain walk test to assess for supplemental oxygen requirement.  -Management of COPD per general pulmonology.      Follow-up: 4 weeks for possible pleurx removal.       Jinnie Fairly, MD  Interventional Pulmonology  [facility]  [facility]  [address]  [place], [place] [address]   T [phone]  F [phone]   [email or web address]                 [1]   Past Medical History:  Diagnosis Date    Diabetes mellitus     Hyperlipidemia     Hypertension    [2]   Past Surgical History:  Procedure Laterality Date    CARDIAC SURGERY  2004    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    PLACEMENT, INDWELLING CATHETER PLEURAL Left 09/19/2023    Procedure: PLACEMENT, INDWELLING TUNNELED CATHETER, PLEURAL;  Surgeon: Fairly Jinnie RAMAN, MD;  Location: [id number];  Service: Pulmonary;  Laterality: Left;  UNIVERSAL    SKIN BIOPSY      TONSILLECTOMY      ULTRASOUND, ENDOBRONCHIAL (EBUS) N/A 09/19/2023    Procedure: ULTRASOUND, ENDOBRONCHIAL (EBUS);  Surgeon: Fairly Jinnie RAMAN, MD;  Location: [facility];  Service: Pulmonary;  Laterality: N/A;   [3]   Family History  Problem Relation Name Age of Onset    Lung cancer Brother     [4]   Social History  Socioeconomic History    Marital status: Widowed   Tobacco Use    Smoking status: Former     Types: Cigarettes     Passive exposure: Past    Tobacco comments:     Quit smoking June 1989, smoked 30 years before   Vaping Use    Vaping status: Never Used   Substance and Sexual Activity    Alcohol use: Not Currently    Drug use: Not Currently    Sexual activity: Not Currently     Social Drivers of Health     Financial Resource Strain: Low Risk  (09/11/2023)    Overall Financial Resource Strain (CARDIA)     Difficulty of Paying Living Expenses: Not hard at all   Food Insecurity: No Food  Insecurity (09/10/2023)    Hunger Vital Sign     Worried About Running  Out of Food in the Last Year: Never true     Ran Out of Food in the Last Year: Never true   Transportation Needs: No Transportation Needs (09/11/2023)    PRAPARE - Therapist, Art (Medical): No     Lack of Transportation (Non-Medical): No   Physical Activity: Insufficiently Active (05/11/2023)    Exercise Vital Sign     Days of Exercise per Week: 6 days     Minutes of Exercise per Session: 20 min   Stress: No Stress Concern Present (03/11/2022)    Received from [facility] - Occupational Stress Questionnaire     Feeling of Stress : Not at all    Received from [facility]    Social Network   Intimate Partner Violence: Not At Risk (09/10/2023)    Humiliation, Afraid, Rape, and Kick questionnaire     Fear of Current or Ex-Partner: No     Emotionally Abused: No     Physically Abused: No     Sexually Abused: No   Housing Stability: Low Risk  (09/11/2023)    Housing Stability Vital Sign     Unable to Pay for Housing in the Last Year: No     Number of Times Moved in the Last Year: 0     Homeless in the Last Year: No   [5]   Current Outpatient Medications:     albuterol  sulfate HFA (PROVENTIL ) 108 (90 Base) MCG/ACT inhaler, Inhale 2 puffs into the lungs, Disp: , Rfl:     allopurinol  (ZYLOPRIM ) 300 MG tablet, Take 1 tablet (300 mg) by mouth daily, Disp: 90 tablet, Rfl: 3    atorvastatin  (LIPITOR) 40 MG tablet, Take 1 tablet (40 mg) by mouth daily, Disp: 90 tablet, Rfl: 3    chlorthalidone  25 MG tablet, Take 0.5 tablets (12.5 mg) by mouth daily, Disp: 45 tablet, Rfl: 3    clobetasol (TEMOVATE) 0.05 % cream, Apply topically as needed, Disp: , Rfl:     dupilumab  (DUPIXENT ) 300 MG/2ML injection, Inject into the skin, Disp: , Rfl:     fluticasone  furoate-vilanterol (BREO ELLIPTA ) 200-25 MCG/ACT Aerosol Pwdr, Breath Activated, Inhale 1 puff into the lungs daily, Disp: 1 each, Rfl: 2     furosemide  (LASIX ) 20 MG tablet, Take 1 tablet (20 mg) by mouth daily, Disp: 180 tablet, Rfl: 3    glipiZIDE  (GLUCOTROL ) 10 MG 24 hr tablet, Take 1 tablet (10 mg) by mouth daily, Disp: 30 tablet, Rfl: 5    Glucosamine-Chondroit-Vit C-Mn (GLUCOSAMINE 1500 COMPLEX PO), Take 2 tablets by mouth daily, Disp: , Rfl:     isosorbide  mononitrate (IMDUR ) 30 MG 24 hr tablet, Take 1 tablet (30 mg) by mouth daily, Disp: 90 tablet, Rfl: 3    metFORMIN  (GLUCOPHAGE -XR) 500 MG 24 hr tablet, Take 2 tablets (1,000 mg) by mouth 2 (two) times daily, Disp: 360 tablet, Rfl: 0    metoprolol  succinate (TOPROL -XL) 100 MG 24 hr tablet, Take 1 tablet (100 mg) by mouth daily, Disp: 90 tablet, Rfl: 3    OneTouch Ultra test strip, Use As Directed, Disp: 100 strip, Rfl: 3    warfarin (COUMADIN ) 5 MG tablet, Take 1.5 tablets (7.5 mg) by mouth every evening (Patient taking differently: Take 1.5 tablets (7.5 mg) by mouth every evening 1/21 and 1/22 holding dose), Disp: 135 tablet, Rfl: 2    Vitamin D, Cholecalciferol, 50 MCG (2000 UT) Cap, Take 2,000 Units by  mouth daily (Patient not taking: Reported on 10/05/2023), Disp: , Rfl:   No current facility-administered medications for this visit.    Facility-Administered Medications Ordered in Other Visits:     0.9% NaCl infusion, , Intravenous, Continuous PRN, Posada Londono, Adriana M, MD, New Bag at 09/19/23 1345    phenylephrine  (NEOSYNEPHRINE) 10 mcg/mL pediatric IV syringe, , Intravenous, PRN, Posada Londono, Adriana M, MD, 100 mcg at 09/19/23 1459    propofol  (DIPRIVAN ) 10 mg/mL infusion (ADULT), , Intravenous, Continuous PRN, Posada Londono, Adriana M, MD, Stopped at 09/19/23 1445    propofol  (DIPRIVAN ) injection, , Intravenous, PRN, Posada Londono, Adriana M, MD, 20 mg at 09/19/23 1452

## 2023-10-06 ENCOUNTER — Encounter: Payer: Self-pay | Admitting: Medical Oncology

## 2023-10-06 ENCOUNTER — Encounter: Payer: Self-pay | Admitting: Pulmonary Disease

## 2023-10-06 ENCOUNTER — Ambulatory Visit (INDEPENDENT_AMBULATORY_CARE_PROVIDER_SITE_OTHER): Payer: Medicare Other | Admitting: Medical Oncology

## 2023-10-06 ENCOUNTER — Telehealth: Payer: Self-pay

## 2023-10-06 VITALS — BP 121/76 | HR 112 | Resp 16 | Ht 65.0 in | Wt 179.0 lb

## 2023-10-06 DIAGNOSIS — C7801 Secondary malignant neoplasm of right lung: Secondary | ICD-10-CM | POA: Diagnosis present

## 2023-10-06 DIAGNOSIS — E1159 Type 2 diabetes mellitus with other circulatory complications: Secondary | ICD-10-CM | POA: Diagnosis present

## 2023-10-06 DIAGNOSIS — C109 Malignant neoplasm of oropharynx, unspecified: Secondary | ICD-10-CM

## 2023-10-06 DIAGNOSIS — J439 Emphysema, unspecified: Secondary | ICD-10-CM | POA: Diagnosis present

## 2023-10-06 DIAGNOSIS — J101 Influenza due to other identified influenza virus with other respiratory manifestations: Secondary | ICD-10-CM | POA: Diagnosis present

## 2023-10-06 DIAGNOSIS — I480 Paroxysmal atrial fibrillation: Secondary | ICD-10-CM | POA: Diagnosis present

## 2023-10-06 DIAGNOSIS — D72829 Elevated white blood cell count, unspecified: Secondary | ICD-10-CM | POA: Diagnosis present

## 2023-10-06 DIAGNOSIS — C7802 Secondary malignant neoplasm of left lung: Secondary | ICD-10-CM | POA: Insufficient documentation

## 2023-10-06 DIAGNOSIS — Z87891 Personal history of nicotine dependence: Secondary | ICD-10-CM

## 2023-10-06 DIAGNOSIS — E1169 Type 2 diabetes mellitus with other specified complication: Secondary | ICD-10-CM | POA: Diagnosis present

## 2023-10-06 DIAGNOSIS — E8809 Other disorders of plasma-protein metabolism, not elsewhere classified: Secondary | ICD-10-CM | POA: Diagnosis present

## 2023-10-06 DIAGNOSIS — Z7951 Long term (current) use of inhaled steroids: Secondary | ICD-10-CM

## 2023-10-06 DIAGNOSIS — E1140 Type 2 diabetes mellitus with diabetic neuropathy, unspecified: Secondary | ICD-10-CM | POA: Diagnosis present

## 2023-10-06 DIAGNOSIS — M1A9XX0 Chronic gout, unspecified, without tophus (tophi): Secondary | ICD-10-CM | POA: Diagnosis present

## 2023-10-06 DIAGNOSIS — E785 Hyperlipidemia, unspecified: Secondary | ICD-10-CM | POA: Diagnosis present

## 2023-10-06 DIAGNOSIS — Z6829 Body mass index (BMI) 29.0-29.9, adult: Secondary | ICD-10-CM

## 2023-10-06 DIAGNOSIS — J9691 Respiratory failure, unspecified with hypoxia: Secondary | ICD-10-CM | POA: Diagnosis present

## 2023-10-06 DIAGNOSIS — E1165 Type 2 diabetes mellitus with hyperglycemia: Secondary | ICD-10-CM | POA: Diagnosis present

## 2023-10-06 DIAGNOSIS — Z7901 Long term (current) use of anticoagulants: Secondary | ICD-10-CM

## 2023-10-06 DIAGNOSIS — Z951 Presence of aortocoronary bypass graft: Secondary | ICD-10-CM

## 2023-10-06 DIAGNOSIS — I152 Hypertension secondary to endocrine disorders: Secondary | ICD-10-CM | POA: Diagnosis present

## 2023-10-06 DIAGNOSIS — E43 Unspecified severe protein-calorie malnutrition: Secondary | ICD-10-CM | POA: Diagnosis present

## 2023-10-06 DIAGNOSIS — Z7984 Long term (current) use of oral hypoglycemic drugs: Secondary | ICD-10-CM

## 2023-10-06 DIAGNOSIS — I110 Hypertensive heart disease with heart failure: Principal | ICD-10-CM | POA: Diagnosis present

## 2023-10-06 DIAGNOSIS — E876 Hypokalemia: Secondary | ICD-10-CM | POA: Diagnosis not present

## 2023-10-06 DIAGNOSIS — I5033 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: Secondary | ICD-10-CM | POA: Diagnosis present

## 2023-10-06 DIAGNOSIS — Z515 Encounter for palliative care: Secondary | ICD-10-CM

## 2023-10-06 DIAGNOSIS — Z79899 Other long term (current) drug therapy: Secondary | ICD-10-CM

## 2023-10-06 DIAGNOSIS — K5901 Slow transit constipation: Secondary | ICD-10-CM | POA: Diagnosis present

## 2023-10-06 DIAGNOSIS — I2510 Atherosclerotic heart disease of native coronary artery without angina pectoris: Secondary | ICD-10-CM | POA: Diagnosis present

## 2023-10-06 DIAGNOSIS — J910 Malignant pleural effusion: Secondary | ICD-10-CM | POA: Diagnosis present

## 2023-10-06 NOTE — Telephone Encounter (Signed)
 [facility] SW CASE MANAGEMENT NOTE  10/06/2023  Patient: Carl [name], Carl [name]  83 y.o., male)    SW received message that pt. Son was inquiring about status of bed order. SW provided feedback from [facility] that they are processing the order and will reach out if additional information was needed. SW also provided the number for [facility] for son to call as requested.   SW following.     Lorrayne Nap, LCSW  [place] [facility] Social Work Case Production Designer, Theatre/television/film   For Head & Neck Cancers, Counsellor,   Hematology, & Endocrine  [facility]: [phone]  F: [phone]  E: [email or web address]

## 2023-10-06 NOTE — Progress Notes (Signed)
 [facility]  [address]  [place], [place] [address]  P: [phone]  F: [phone]            Assessment & Plan:       1. Squamous cell carcinoma of oropharynx          [name] is being evaluated in follow-up for management of his metastatic squamous cell carcinoma.  On clinical evaluation he remained stable essentially sedentary and requiring assistance with ADLs, ECOG PS 2.  Reviewed the biopsy findings from the mediastinal nodes as well as the pleural fluid cytology with pathology.  Based upon this review findings are more consistent with metastatic squamous cell carcinoma which is p16 positive.  Reviewed the PET/CT imaging which shows right neck hypermetabolic lymph node as well as uptake in the tongue.  These findings taken together are most consistent with a oropharyngeal squamous cell carcinoma.  The final TNM stage is T0, and 1, M1 stage IV p16 positive cancer.    We reviewed the diagnosis and staging with the patient and his family members.  Explained that his cancer is incurable and any treatment would only be palliative in nature.  Discussed overall goals of care including focusing on comfort measures without any active treatment for cancer.  Both patient and his family members do wish to pursue treatment.    Discussed that the standard of care treatment in this setting would be chemotherapy plus immunotherapy and the standard regimen may be too difficult for patient to tolerate given his marginal performance status.  We will await the tumor PD-L1 testing to see if he would be eligible for single agent pembrolizumab.  Alternatively we also discussed weekly carboplatin and paclitaxel with pembrolizumab for the patient.  He would like to proceed on this regimen and discussed with pharmacy about generating an appropriate plan for patient.  Provided information detailing the risks and benefits of these regimen to the patient.  He will also undergo  teaching with our pharmacist on the potential adverse effects of this regimen.  By that time we expect to have the PD-L1 results and we will finalize the treatment plan.  Plan and findings discussed with patient and his family members.  They expressed agreement.  In the meantime we will also send a NavDX testing to confirm possible HPV induced malignancy.  We have answered all of their questions.    No orders of the defined types were placed in this encounter.    There are no Patient Instructions on file for this visit.    TNM STAGE  Cancer Staging   Squamous cell carcinoma of oropharynx  Staging form: Pharynx - HPV-Mediated Oropharynx, AJCC 8th Edition  - Clinical stage from 10/06/2023: Stage IV (cT0, cN1, pM1, p16+) - Unsigned      ONCOLOGY HISTORY  Oncology History   Squamous cell carcinoma of oropharynx   09/12/2023 Initial Diagnosis    BODY CAVITY,  LEFT PLEURAL FLUID, THORACENTESIS:  -Malignant cells present, compatible with metastatic adenocarcinoma.     09/19/2023 Biopsy    A. Left upper lung lobe nodule, transbronchial biopsy: Squamous cell carcinoma     A. LYMPH NODE, STATION 11L WITH LEFT HILAR MASS, EBUS-FNA: SQUAMOUS CELL CARCINOMA     B. LYMPH NODE, STATION 4L,EBUS-FNA: NEGATIVE FOR MALIGNANCY       10/05/2023 Genomic Testing    xF - PBRM1 Z384*, FGFR4 F462L, KMT2A P511A           Subjective:     Carl [name] is  seen in follow-up today in the office.  He is accompanied by his son and daughter-in-law.  Since her last visit he continues to be essentially sedentary and requiring assistance for his ADLs.  He is able to do some of the ADLs on his own but for others he needs assistance.  No new complaints at this time.  He does get short of breath with exertion but that has remained stable.  His son and daughter-in-law do note that his appetite is somewhat diminished along with some weight loss as well.  No other new complaints.    Allergies[1]  Current Medications[2]  Review of Systems   Constitutional:  Positive for  appetite change, fatigue and unexpected weight change.   HENT: Negative.     Eyes: Negative.    Respiratory:  Positive for shortness of breath.    Cardiovascular: Negative.    Gastrointestinal: Negative.    Genitourinary: Negative.    Musculoskeletal: Negative.    Neurological: Negative.    Hematological: Negative.    Psychiatric/Behavioral: Negative.         Objective:     Vitals:    10/06/23 1146   BP: 121/76   BP Site: Right arm   Patient Position: Sitting   Cuff Size: Medium   Pulse: (!) 112   Resp: 16   SpO2: 96%   Weight: 81.2 kg (179 lb)   Height: 1.651 m (5' 5)          Physical Exam  Vitals reviewed.   Constitutional:       General: He is not in acute distress.     Appearance: He is normal weight. He is not toxic-appearing.   HENT:      Head: Normocephalic and atraumatic.   Eyes:      General: No scleral icterus.        Right eye: No discharge.         Left eye: No discharge.      Extraocular Movements: Extraocular movements intact.      Conjunctiva/sclera: Conjunctivae normal.   Cardiovascular:      Rate and Rhythm: Normal rate and regular rhythm.      Heart sounds: Normal heart sounds. No murmur heard.     No gallop.   Pulmonary:      Effort: Pulmonary effort is normal. No respiratory distress.      Breath sounds: Normal breath sounds. No wheezing or rales.   Abdominal:      General: Bowel sounds are normal. There is no distension.      Palpations: Abdomen is soft.      Tenderness: There is no abdominal tenderness. There is no guarding.   Musculoskeletal:         General: Normal range of motion.      Cervical back: Normal range of motion and neck supple. No rigidity.      Right lower leg: No edema.      Left lower leg: No edema.   Lymphadenopathy:      Cervical: No cervical adenopathy.   Skin:     General: Skin is warm.      Coloration: Skin is not jaundiced.   Neurological:      General: No focal deficit present.      Mental Status: He is alert and oriented to person, place, and time.      Cranial  Nerves: No cranial nerve deficit.   Psychiatric:         Mood and Affect: Mood  normal.         Behavior: Behavior normal.         LAB DATA:   WBC   Date/Time Value Ref Range Status   09/19/2023 04:21 AM 12.69 (H) 3.10 - 9.50 x10 3/uL Final   12/23/2022 10:09 AM 10.69 (H) 3.10 - 9.50 x10 3/uL Final     Hemoglobin   Date/Time Value Ref Range Status   09/19/2023 04:21 AM 14.1 12.5 - 17.1 g/dL Final     Hgb   Date/Time Value Ref Range Status   12/23/2022 10:09 AM 14.1 12.5 - 17.1 g/dL Final     Platelet Count   Date/Time Value Ref Range Status   09/19/2023 04:21 AM 327 142 - 346 x10 3/uL Final     Platelets   Date/Time Value Ref Range Status   12/23/2022 10:09 AM 231 142 - 346 x10 3/uL Final     Neutrophils Absolute   Date/Time Value Ref Range Status   12/23/2022 10:09 AM 7.18 (H) 1.10 - 6.33 x10 3/uL Final     Absolute Neutrophils   Date/Time Value Ref Range Status   09/19/2023 04:21 AM 9.72 (H) 1.10 - 6.33 x10 3/uL Final     Sodium   Date/Time Value Ref Range Status   09/19/2023 04:21 AM 138 135 - 145 mEq/L Final   02/08/2023 11:05 AM 142 135 - 145 mEq/L Final     Potassium   Date/Time Value Ref Range Status   09/19/2023 04:21 AM 3.8 3.5 - 5.3 mEq/L Final   02/08/2023 11:05 AM 4.7 3.5 - 5.3 mEq/L Final     Magnesium    Date/Time Value Ref Range Status   09/19/2023 04:21 AM 2.0 1.6 - 2.6 mg/dL Final   94/93/7975 97:93 PM 1.8 1.6 - 2.6 mg/dL Final     Creatinine   Date/Time Value Ref Range Status   09/19/2023 04:21 AM 0.8 0.5 - 1.5 mg/dL Final   93/94/7975 88:94 AM 1.1 0.5 - 1.5 mg/dL Final     Bilirubin, Total   Date/Time Value Ref Range Status   09/11/2023 06:17 AM 0.6 0.2 - 1.2 mg/dL Final   97/78/7975 91:45 AM 0.8 0.2 - 1.2 mg/dL Final     AST (SGOT)   Date/Time Value Ref Range Status   09/11/2023 06:17 AM 43 (H) <=41 U/L Final   10/26/2022 08:54 AM 26 5 - 41 U/L Final     ALT   Date/Time Value Ref Range Status   09/11/2023 06:17 AM 17 <=55 U/L Final   10/26/2022 08:54 AM 25 0 - 55 U/L Final     Protein, Total    Date/Time Value Ref Range Status   09/11/2023 06:17 AM 6.5 6.0 - 8.3 g/dL Final     Comment:     Delta investigated   10/26/2022 08:54 AM 6.3 6.0 - 8.3 g/dL Final     Albumin    Date/Time Value Ref Range Status   09/11/2023 06:17 AM 2.4 (L) 3.5 - 5.0 g/dL Final   97/78/7975 91:45 AM 3.6 3.5 - 5.0 g/dL Final        RADIOLOGY DATA:   XR Chest AP Portable    Result Date: 10/05/2023   Slight decrease in left pleural effusion since prior study Jeana Sor, MD 10/05/2023 3:01 PM      Daveena Elmore, MD          [1]   Allergies  Allergen Reactions    Cephalexin Rash   [2]   Current Outpatient Medications  Medication Sig Dispense Refill    albuterol  sulfate HFA (PROVENTIL ) 108 (90 Base) MCG/ACT inhaler Inhale 2 puffs into the lungs      allopurinol  (ZYLOPRIM ) 300 MG tablet Take 1 tablet (300 mg) by mouth daily 90 tablet 3    atorvastatin  (LIPITOR) 40 MG tablet Take 1 tablet (40 mg) by mouth daily 90 tablet 3    chlorthalidone  25 MG tablet Take 0.5 tablets (12.5 mg) by mouth daily 45 tablet 3    clobetasol (TEMOVATE) 0.05 % cream Apply topically as needed      dupilumab  (DUPIXENT ) 300 MG/2ML injection Inject into the skin      fluticasone  furoate-vilanterol (BREO ELLIPTA ) 200-25 MCG/ACT Aerosol Pwdr, Breath Activated Inhale 1 puff into the lungs daily 1 each 2    furosemide  (LASIX ) 20 MG tablet Take 1 tablet (20 mg) by mouth daily 180 tablet 3    glipiZIDE  (GLUCOTROL ) 10 MG 24 hr tablet Take 1 tablet (10 mg) by mouth daily 30 tablet 5    Glucosamine-Chondroit-Vit C-Mn (GLUCOSAMINE 1500 COMPLEX PO) Take 2 tablets by mouth daily      isosorbide  mononitrate (IMDUR ) 30 MG 24 hr tablet Take 1 tablet (30 mg) by mouth daily 90 tablet 3    metFORMIN  (GLUCOPHAGE -XR) 500 MG 24 hr tablet Take 2 tablets (1,000 mg) by mouth 2 (two) times daily 360 tablet 0    metoprolol  succinate (TOPROL -XL) 100 MG 24 hr tablet Take 1 tablet (100 mg) by mouth daily 90 tablet 3    OneTouch Ultra test strip Use As Directed 100 strip 3    warfarin  (COUMADIN ) 5 MG tablet Take 1.5 tablets (7.5 mg) by mouth every evening (Patient taking differently: Take 1.5 tablets (7.5 mg) by mouth every evening 1/21 and 1/22 holding dose) 135 tablet 2    Vitamin D, Cholecalciferol, 50 MCG (2000 UT) Cap Take 2,000 Units by mouth daily (Patient not taking: Reported on 09/27/2023)       No current facility-administered medications for this visit.     Facility-Administered Medications Ordered in Other Visits   Medication Dose Route Frequency Provider Last Rate Last Admin    0.9% NaCl infusion   Intravenous Continuous PRN Posada Londono, Adriana M, MD   New Bag at 09/19/23 1345    phenylephrine  (NEOSYNEPHRINE) 10 mcg/mL pediatric IV syringe   Intravenous PRN Posada Londono, Adriana M, MD   100 mcg at 09/19/23 1459    propofol  (DIPRIVAN ) 10 mg/mL infusion (ADULT)   Intravenous Continuous PRN Posada Londono, Adriana M, MD   Stopped at 09/19/23 1445    propofol  (DIPRIVAN ) injection   Intravenous PRN Posada Londono, Adriana M, MD   20 mg at 09/19/23 1452

## 2023-10-06 NOTE — Telephone Encounter (Signed)
 Nurse Navigator Follow up Note    Patient was seen today by Dr. Jeffie to review results and establish treatment plan    Treatment plan: PACLitaxel + CARBOplatin Weekly (AUC 2) Low Dose + Keytruda    Handout for IO provided during this visit     Ambulatory pulse ox evaluated during this visit. Patient was 95% on RA with HR of 110s-120s. He reports a hx of Afib and denies any chest discomfort. After a few steps, he started feeling short of breath. 02 remained at 95% with activity but HR was at 145.SABRA Evaluation was stopped due to his discomfort and he was assisted back to his wheelchair. He reports starting to feel better after a few seconds of him sitting and resting. Distress not evident at rest. HR improved to the 120s.     Son also showed low 02 sat captured by the patient's mobile device. 87%-89% was captured between 12-4am the past few days. Patient further described his dyspnea to be positional and is better when he is laying flat vs at an incline position.     Above information relayed to Dr. Jeffie and Dr. Maree. Advised cardio work up. Patient has an appointment with his cardiologist next week    [x]  Reviewed the education process with patient/family and arranged the following appointments (date/time/location):      [x]  Diagnostic testing/procedures: Cancelled walking test due to his current condition        Will need NavDX to be drawn same day of chemo    [x]  Staff Message sent to [facility] Scheduling team requesting appointment for treatment start on or about: Urgent start    [x]  Chemotherapy/Biotherapy Teaching session: TBD    [x]  Follow up with Provider (APP/MD): C1 of chemo   [x]  Referrals: Follow up message sent to the [facility] SW regarding hospital bed     [x]  Patient/family members questions addressed  [x]  Evaluated patient/family members understanding of education: Patient verbalizes understanding.     Thoracic Oncology Nurse Navigator contact information provided. Will remain  as an available resource for this patient and assist with coordination of care as needed.      Pastor Barlow RN MSN  Nurse Navigator for Thoracic Oncology  [facility]  [address]  [place], [place] [address]  MALVA: [phone]  F: [phone]

## 2023-10-07 LAB — EXTERNAL PT/INR: PT INR: 4 — AB (ref 0.9–1.1)

## 2023-10-09 ENCOUNTER — Other Ambulatory Visit: Payer: Self-pay

## 2023-10-09 ENCOUNTER — Inpatient Hospital Stay
Admission: EM | Admit: 2023-10-09 | Discharge: 2023-10-17 | DRG: 291 | Disposition: A | Discharge status: Home Health | Payer: Medicare Other | Attending: Internal Medicine | Admitting: Internal Medicine

## 2023-10-09 ENCOUNTER — Encounter (INDEPENDENT_AMBULATORY_CARE_PROVIDER_SITE_OTHER): Payer: Self-pay | Admitting: Medical

## 2023-10-09 ENCOUNTER — Ambulatory Visit (INDEPENDENT_AMBULATORY_CARE_PROVIDER_SITE_OTHER): Payer: Medicare Other | Admitting: Medical

## 2023-10-09 ENCOUNTER — Emergency Department: Payer: Medicare Other

## 2023-10-09 ENCOUNTER — Ambulatory Visit (INDEPENDENT_AMBULATORY_CARE_PROVIDER_SITE_OTHER): Payer: Self-pay

## 2023-10-09 ENCOUNTER — Encounter: Payer: Self-pay | Admitting: Medical Oncology

## 2023-10-09 VITALS — BP 112/61 | HR 110 | Temp 97.4°F | Resp 20 | Wt 180.0 lb

## 2023-10-09 DIAGNOSIS — J101 Influenza due to other identified influenza virus with other respiratory manifestations: Principal | ICD-10-CM | POA: Diagnosis present

## 2023-10-09 DIAGNOSIS — J439 Emphysema, unspecified: Secondary | ICD-10-CM | POA: Diagnosis present

## 2023-10-09 DIAGNOSIS — Z7984 Long term (current) use of oral hypoglycemic drugs: Secondary | ICD-10-CM

## 2023-10-09 DIAGNOSIS — K5901 Slow transit constipation: Secondary | ICD-10-CM | POA: Diagnosis present

## 2023-10-09 DIAGNOSIS — Z7951 Long term (current) use of inhaled steroids: Secondary | ICD-10-CM

## 2023-10-09 DIAGNOSIS — M1A9XX0 Chronic gout, unspecified, without tophus (tophi): Secondary | ICD-10-CM | POA: Diagnosis present

## 2023-10-09 DIAGNOSIS — C7801 Secondary malignant neoplasm of right lung: Secondary | ICD-10-CM | POA: Diagnosis present

## 2023-10-09 DIAGNOSIS — I5033 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: Secondary | ICD-10-CM | POA: Diagnosis present

## 2023-10-09 DIAGNOSIS — R0602 Shortness of breath: Secondary | ICD-10-CM

## 2023-10-09 DIAGNOSIS — J910 Malignant pleural effusion: Secondary | ICD-10-CM | POA: Diagnosis present

## 2023-10-09 DIAGNOSIS — I509 Heart failure, unspecified: Secondary | ICD-10-CM

## 2023-10-09 DIAGNOSIS — D72829 Elevated white blood cell count, unspecified: Secondary | ICD-10-CM | POA: Diagnosis present

## 2023-10-09 DIAGNOSIS — E785 Hyperlipidemia, unspecified: Secondary | ICD-10-CM | POA: Diagnosis present

## 2023-10-09 DIAGNOSIS — E8809 Other disorders of plasma-protein metabolism, not elsewhere classified: Secondary | ICD-10-CM | POA: Diagnosis present

## 2023-10-09 DIAGNOSIS — Z79899 Other long term (current) drug therapy: Secondary | ICD-10-CM

## 2023-10-09 DIAGNOSIS — E1140 Type 2 diabetes mellitus with diabetic neuropathy, unspecified: Secondary | ICD-10-CM | POA: Diagnosis present

## 2023-10-09 DIAGNOSIS — J9691 Respiratory failure, unspecified with hypoxia: Secondary | ICD-10-CM | POA: Diagnosis present

## 2023-10-09 DIAGNOSIS — C109 Malignant neoplasm of oropharynx, unspecified: Secondary | ICD-10-CM | POA: Diagnosis present

## 2023-10-09 DIAGNOSIS — E1159 Type 2 diabetes mellitus with other circulatory complications: Secondary | ICD-10-CM | POA: Diagnosis present

## 2023-10-09 DIAGNOSIS — Z7901 Long term (current) use of anticoagulants: Secondary | ICD-10-CM

## 2023-10-09 DIAGNOSIS — Z951 Presence of aortocoronary bypass graft: Secondary | ICD-10-CM

## 2023-10-09 DIAGNOSIS — E1169 Type 2 diabetes mellitus with other specified complication: Secondary | ICD-10-CM | POA: Diagnosis present

## 2023-10-09 DIAGNOSIS — E1165 Type 2 diabetes mellitus with hyperglycemia: Secondary | ICD-10-CM | POA: Diagnosis present

## 2023-10-09 DIAGNOSIS — I2510 Atherosclerotic heart disease of native coronary artery without angina pectoris: Secondary | ICD-10-CM | POA: Diagnosis present

## 2023-10-09 DIAGNOSIS — E876 Hypokalemia: Secondary | ICD-10-CM | POA: Diagnosis not present

## 2023-10-09 DIAGNOSIS — I480 Paroxysmal atrial fibrillation: Secondary | ICD-10-CM | POA: Diagnosis present

## 2023-10-09 DIAGNOSIS — Z515 Encounter for palliative care: Secondary | ICD-10-CM

## 2023-10-09 DIAGNOSIS — R079 Chest pain, unspecified: Secondary | ICD-10-CM

## 2023-10-09 DIAGNOSIS — Z87891 Personal history of nicotine dependence: Secondary | ICD-10-CM

## 2023-10-09 DIAGNOSIS — I110 Hypertensive heart disease with heart failure: Principal | ICD-10-CM | POA: Diagnosis present

## 2023-10-09 DIAGNOSIS — E43 Unspecified severe protein-calorie malnutrition: Secondary | ICD-10-CM | POA: Diagnosis present

## 2023-10-09 DIAGNOSIS — I152 Hypertension secondary to endocrine disorders: Secondary | ICD-10-CM | POA: Diagnosis present

## 2023-10-09 DIAGNOSIS — C7802 Secondary malignant neoplasm of left lung: Secondary | ICD-10-CM | POA: Diagnosis present

## 2023-10-09 DIAGNOSIS — I4892 Unspecified atrial flutter: Secondary | ICD-10-CM

## 2023-10-09 DIAGNOSIS — Z6829 Body mass index (BMI) 29.0-29.9, adult: Secondary | ICD-10-CM

## 2023-10-09 LAB — COMPREHENSIVE METABOLIC PANEL
ALT: 27 U/L (ref <=55)
AST (SGOT): 36 U/L (ref <=41)
Albumin/Globulin Ratio: 0.5 — ABNORMAL LOW (ref 0.9–2.2)
Albumin: 2 g/dL — ABNORMAL LOW (ref 3.5–5.0)
Alkaline Phosphatase: 158 U/L — ABNORMAL HIGH (ref 37–117)
Anion Gap: 9 (ref 5.0–15.0)
BUN: 25 mg/dL (ref 9–28)
Bilirubin, Total: 0.5 mg/dL (ref 0.2–1.2)
CO2: 34 meq/L — ABNORMAL HIGH (ref 17–29)
Calcium: 10.9 mg/dL — ABNORMAL HIGH (ref 7.9–10.2)
Chloride: 97 meq/L — ABNORMAL LOW (ref 99–111)
Creatinine: 0.8 mg/dL (ref 0.5–1.5)
GFR: >60 mL/min/{1.73_m2} (ref >=60.0)
Globulin: 4 g/dL — ABNORMAL HIGH (ref 2.0–3.6)
Glucose: 136 mg/dL — ABNORMAL HIGH (ref 70–100)
Potassium: 3.5 meq/L (ref 3.5–5.3)
Protein, Total: 6 g/dL (ref 6.0–8.3)
Sodium: 140 meq/L (ref 135–145)

## 2023-10-09 LAB — LAB USE ONLY - CBC WITH DIFFERENTIAL
Absolute Basophils: 0.03 10*3/uL (ref 0.00–0.08)
Absolute Eosinophils: 0 10*3/uL (ref 0.00–0.44)
Absolute Immature Granulocytes: 0.13 10*3/uL — ABNORMAL HIGH (ref 0.00–0.07)
Absolute Lymphocytes: 0.87 10*3/uL (ref 0.42–3.22)
Absolute Monocytes: 1.14 10*3/uL — ABNORMAL HIGH (ref 0.21–0.85)
Absolute Neutrophils: 14.54 10*3/uL — ABNORMAL HIGH (ref 1.10–6.33)
Absolute nRBC: 0 10*3/uL (ref <=0.00)
Basophils %: 0.2 %
Eosinophils %: 0 %
Hematocrit: 38.9 % (ref 37.6–49.6)
Hemoglobin: 12.8 g/dL (ref 12.5–17.1)
Immature Granulocytes %: 0.8 %
Lymphocytes %: 5.2 %
MCH: 30.2 pg (ref 25.1–33.5)
MCHC: 32.9 g/dL (ref 31.5–35.8)
MCV: 91.7 fL (ref 78.0–96.0)
MPV: 11.3 fL (ref 8.9–12.5)
Monocytes %: 6.8 %
Neutrophils %: 87 %
Platelet Count: 271 10*3/uL (ref 142–346)
Preliminary Absolute Neutrophil Count: 14.54 10*3/uL — ABNORMAL HIGH (ref 1.10–6.33)
RBC: 4.24 10*6/uL (ref 4.20–5.90)
RDW: 15 % (ref 11–15)
WBC: 16.71 10*3/uL — ABNORMAL HIGH (ref 3.10–9.50)
nRBC %: 0 /100{WBCs} (ref <=0.0)

## 2023-10-09 LAB — HIGH SENSITIVITY TROPONIN-I
hs Troponin: 13 ng/L (ref <=35.0)
hs Troponin: 19.5 ng/L (ref <=35.0)

## 2023-10-09 LAB — NT-PROBNP: NT-ProBNP: 2425 pg/mL — ABNORMAL HIGH (ref <450)

## 2023-10-09 LAB — ECG 12-LEAD
Q-T Interval: 292 ms
QRS Duration: 78 ms
QTC Calculation (Bezet): 391 ms
R Axis: 7 degrees
T Axis: 199 degrees
Ventricular Rate: 108 {beats}/min

## 2023-10-09 LAB — COVID-19 (SARS-COV-2) & INFLUENZA  A/B, NAA (ROCHE LIAT)
Influenza A RNA: DETECTED — AB
Influenza B RNA: NOT DETECTED
SARS-CoV-2 (COVID-19) RNA: NOT DETECTED

## 2023-10-09 LAB — HIGH SENSITIVITY TROPONIN-I WITH DELTA
hs Troponin-I Delta: 2
hs Troponin: 17.5 ng/L (ref <=35.0)

## 2023-10-09 LAB — PT/INR
INR: 6.8 (ref 0.9–1.1)
PT: 75.3 s — ABNORMAL HIGH (ref 10.1–12.9)

## 2023-10-09 MED ORDER — POTASSIUM CHLORIDE CRYS ER 20 MEQ PO TBCR
0.0000 meq | EXTENDED_RELEASE_TABLET | ORAL | Status: DC | PRN
Start: 2023-10-09 — End: 2023-10-17
  Administered 2023-10-11: 20 meq via ORAL
  Filled 2023-10-09 (×2): qty 3

## 2023-10-09 MED ORDER — GLUCOSE 40 % PO GEL (WRAP)
15.0000 g | ORAL | Status: DC | PRN
Start: 2023-10-09 — End: 2023-10-12

## 2023-10-09 MED ORDER — SALINE SPRAY 0.65 % NA SOLN
2.0000 | NASAL | Status: DC | PRN
Start: 2023-10-09 — End: 2023-10-17

## 2023-10-09 MED ORDER — ISOSORBIDE MONONITRATE ER 30 MG PO TB24
30.0000 mg | ORAL_TABLET | Freq: Every day | ORAL | Status: DC
Start: 2023-10-10 — End: 2023-10-17
  Administered 2023-10-10 – 2023-10-17 (×8): 30 mg via ORAL
  Filled 2023-10-09 (×9): qty 1

## 2023-10-09 MED ORDER — FLUTICASONE FUROATE-VILANTEROL 200-25 MCG/ACT IN AEPB
1.0000 | INHALATION_SPRAY | Freq: Every morning | RESPIRATORY_TRACT | Status: DC
Start: 2023-10-09 — End: 2023-10-09

## 2023-10-09 MED ORDER — DEXTROSE 50 % IV SOLN
12.5000 g | INTRAVENOUS | Status: DC | PRN
Start: 2023-10-09 — End: 2023-10-12

## 2023-10-09 MED ORDER — FUROSEMIDE 10 MG/ML IJ SOLN
40.0000 mg | Freq: Two times a day (BID) | INTRAMUSCULAR | Status: DC
Start: 2023-10-09 — End: 2023-10-14
  Administered 2023-10-09 – 2023-10-14 (×11): 40 mg via INTRAVENOUS
  Filled 2023-10-09 (×7): qty 4

## 2023-10-09 MED ORDER — NALOXONE HCL 0.4 MG/ML IJ SOLN (WRAP)
0.2000 mg | INTRAMUSCULAR | Status: DC | PRN
Start: 2023-10-09 — End: 2023-10-17

## 2023-10-09 MED ORDER — BENZOCAINE-MENTHOL MT LOZG (WRAP)
1.0000 | LOZENGE | OROMUCOSAL | Status: DC | PRN
Start: 2023-10-09 — End: 2023-10-17
  Administered 2023-10-15: 1 via BUCCAL

## 2023-10-09 MED ORDER — METOPROLOL SUCCINATE ER 25 MG PO TB24
25.0000 mg | ORAL_TABLET | Freq: Every day | ORAL | Status: AC
Start: 2023-10-09 — End: 2023-10-09
  Administered 2023-10-09: 25 mg via ORAL
  Filled 2023-10-09: qty 1

## 2023-10-09 MED ORDER — FUROSEMIDE 10 MG/ML IJ SOLN
20.0000 mg | Freq: Once | INTRAMUSCULAR | Status: AC
Start: 2023-10-09 — End: 2023-10-09
  Administered 2023-10-09: 20 mg via INTRAVENOUS

## 2023-10-09 MED ORDER — POTASSIUM & SODIUM PHOSPHATES 280-160-250 MG PO PACK
2.0000 | PACK | ORAL | Status: DC | PRN
Start: 2023-10-09 — End: 2023-10-17

## 2023-10-09 MED ORDER — BENZONATATE 100 MG PO CAPS
100.0000 mg | ORAL_CAPSULE | Freq: Three times a day (TID) | ORAL | Status: DC | PRN
Start: 2023-10-09 — End: 2023-10-17

## 2023-10-09 MED ORDER — METOPROLOL SUCCINATE ER 50 MG PO TB24
100.0000 mg | ORAL_TABLET | Freq: Every day | ORAL | Status: DC
Start: 2023-10-10 — End: 2023-10-17
  Administered 2023-10-10 – 2023-10-17 (×8): 100 mg via ORAL
  Filled 2023-10-09 (×7): qty 2

## 2023-10-09 MED ORDER — ACETAMINOPHEN 325 MG PO TABS
650.0000 mg | ORAL_TABLET | ORAL | Status: DC | PRN
Start: 2023-10-09 — End: 2023-10-17

## 2023-10-09 MED ORDER — ATORVASTATIN CALCIUM 40 MG PO TABS
40.0000 mg | ORAL_TABLET | Freq: Every day | ORAL | Status: DC
Start: 2023-10-10 — End: 2023-10-17
  Administered 2023-10-10 – 2023-10-17 (×8): 40 mg via ORAL
  Filled 2023-10-09 (×7): qty 1

## 2023-10-09 MED ORDER — POTASSIUM CHLORIDE 10 MEQ/100ML IV SOLN (WRAP)
10.0000 meq | INTRAVENOUS | Status: DC | PRN
Start: 2023-10-09 — End: 2023-10-17

## 2023-10-09 MED ORDER — ALLOPURINOL 300 MG PO TABS
300.0000 mg | ORAL_TABLET | Freq: Every day | ORAL | Status: DC
Start: 2023-10-10 — End: 2023-10-17
  Administered 2023-10-10 – 2023-10-17 (×8): 300 mg via ORAL
  Filled 2023-10-09 (×7): qty 1

## 2023-10-09 MED ORDER — OSELTAMIVIR PHOSPHATE 75 MG PO CAPS
75.0000 mg | ORAL_CAPSULE | Freq: Two times a day (BID) | ORAL | Status: AC
Start: 2023-10-09 — End: 2023-10-14
  Administered 2023-10-09 – 2023-10-14 (×10): 75 mg via ORAL
  Filled 2023-10-09 (×10): qty 1

## 2023-10-09 MED ORDER — BUDESONIDE-FORMOTEROL FUMARATE 160-4.5 MCG/ACT IN AERO
2.0000 | INHALATION_SPRAY | Freq: Two times a day (BID) | RESPIRATORY_TRACT | Status: DC
Start: 2023-10-09 — End: 2023-10-17
  Administered 2023-10-09 – 2023-10-17 (×16): 2 via RESPIRATORY_TRACT
  Filled 2023-10-09: qty 6

## 2023-10-09 MED ORDER — POTASSIUM CHLORIDE 20 MEQ PO PACK
0.0000 meq | PACK | ORAL | Status: DC | PRN
Start: 2023-10-09 — End: 2023-10-17
  Administered 2023-10-11: 40 meq via ORAL
  Administered 2023-10-12 – 2023-10-14 (×2): 60 meq via ORAL
  Administered 2023-10-15 – 2023-10-17 (×3): 40 meq via ORAL
  Filled 2023-10-09 (×2): qty 3
  Filled 2023-10-09 (×4): qty 2

## 2023-10-09 MED ORDER — ISOSORBIDE MONONITRATE ER 30 MG PO TB24
30.0000 mg | ORAL_TABLET | Freq: Every day | ORAL | Status: DC
Start: 2023-10-09 — End: 2023-10-09

## 2023-10-09 MED ORDER — GLUCAGON 1 MG IJ SOLR (WRAP)
1.0000 mg | INTRAMUSCULAR | Status: DC | PRN
Start: 2023-10-09 — End: 2023-10-12

## 2023-10-09 MED ORDER — IOHEXOL 350 MG/ML IV SOLN
80.0000 mL | Freq: Once | INTRAVENOUS | Status: AC | PRN
Start: 2023-10-09 — End: 2023-10-09
  Administered 2023-10-09: 80 mL via INTRAVENOUS

## 2023-10-09 MED ORDER — DEXTROSE 10 % IV BOLUS
12.5000 g | INTRAVENOUS | Status: DC | PRN
Start: 2023-10-09 — End: 2023-10-12

## 2023-10-09 MED ORDER — HYDROCHLOROTHIAZIDE 25 MG PO TABS
25.0000 mg | ORAL_TABLET | Freq: Every day | ORAL | Status: DC
Start: 2023-10-10 — End: 2023-10-16
  Administered 2023-10-10 – 2023-10-16 (×7): 25 mg via ORAL
  Filled 2023-10-09 (×7): qty 1

## 2023-10-09 MED ORDER — MELATONIN 3 MG PO TABS
3.0000 mg | ORAL_TABLET | Freq: Every evening | ORAL | Status: DC | PRN
Start: 2023-10-09 — End: 2023-10-17
  Administered 2023-10-11: 3 mg via ORAL
  Filled 2023-10-09: qty 1

## 2023-10-09 MED ORDER — CARBOXYMETHYLCELLULOSE SOD PF 0.5 % OP SOLN
1.0000 [drp] | Freq: Three times a day (TID) | OPHTHALMIC | Status: DC | PRN
Start: 2023-10-09 — End: 2023-10-17

## 2023-10-09 MED ORDER — MAGNESIUM SULFATE IN D5W 1-5 GM/100ML-% IV SOLN
1.0000 g | INTRAVENOUS | Status: DC | PRN
Start: 2023-10-09 — End: 2023-10-17
  Administered 2023-10-10 (×3): 1 g via INTRAVENOUS
  Filled 2023-10-09 (×4): qty 100

## 2023-10-09 NOTE — Nursing Progress Note (Signed)
 4 eyes in 4 hours pressure injury assessment note:      Completed with: Rn Amina   Unit & Time admitted: at 1815 at 5 Origional             Bony Prominences: Check appropriate box; if wound is present enter wound assessment in LDA     Occiput:                 [x] WNL  []  Wound present  Face:                     [x] WNL  []  Wound present  Ears:                      [x] WNL  []  Wound present  Spine:                    [x] WNL  []  Wound present  Shoulders:             [x] WNL  []  Wound present  Elbows:                  [x] WNL  []  Wound present  Sacrum/coccyx:     [x] WNL  []  Wound present  Ischial Tuberosity:  [x] WNL  []  Wound present  Trochanter/Hip:      [x] WNL  []  Wound present  Knees:                   [x] WNL  []  Wound present  Ankles:                   [x] WNL  []  Wound present  Heels:                    [x] WNL  []  Wound present  Other pressure areas:  []  Wound location       Device related: []  Device name:         LDA completed if wound present: yes/no  Consult WOCN if necessary    Other skin related issues, ie tears, rash, etc, document in Integumentary flowsheet  Skin intact with scattered bruises and + 3 edema to the bil LEs.

## 2023-10-09 NOTE — Pharmacy Admission Med History Basic (Signed)
 Admission Medication History      Patient's Pharmacy Information:    Primary Pharmacy Updated in Epic: Yes  Preferred pharmacy:   [id number] - [place], [place] - [address]  [phone]  [place] [address]  Phone: [phone] Fax: [phone]        Primary Source of Medication History: Child, Pharmacy, and Self      Prior to Admission Medication List:  Home Medications       Med List Status: Pharmacy Completed Set By: Vincente Ruder at 10/09/2023  5:34 PM              albuterol  sulfate HFA (PROVENTIL ) 108 (90 Base) MCG/ACT inhaler     Inhale 2 puffs into the lungs every 4 (four) hours as needed for Shortness of Breath     allopurinol  (ZYLOPRIM ) 300 MG tablet     Take 1 tablet (300 mg) by mouth daily     atorvastatin  (LIPITOR) 40 MG tablet     Take 1 tablet (40 mg) by mouth daily     chlorthalidone  25 MG tablet     Take 0.5 tablets (12.5 mg) by mouth daily     clobetasol (TEMOVATE) 0.05 % cream     Apply topically daily as needed (for eczema)     dupilumab  (DUPIXENT ) 300 MG/2ML injection     Inject 300 mg into the skin every 14 (fourteen) days On tues     furosemide  (LASIX ) 20 MG tablet     Take 1 tablet (20 mg) by mouth daily     glipiZIDE  (GLUCOTROL ) 10 MG 24 hr tablet     Take 1 tablet (10 mg) by mouth daily     GLUCOSAMINE-CHONDROITIN PO     Take 2 tablets by mouth every morning (Glucosamine HCI 1500 mg and Chondroitin Sulfate 1200 mg)     isosorbide  mononitrate (IMDUR ) 30 MG 24 hr tablet     Take 1 tablet (30 mg) by mouth daily     metFORMIN  (GLUCOPHAGE -XR) 500 MG 24 hr tablet     Take 2 tablets (1,000 mg) by mouth 2 (two) times daily     metoprolol  succinate (TOPROL -XL) 100 MG 24 hr tablet     Take 1 tablet (100 mg) by mouth daily     Mometasone  Furo-Formoterol  Fum (Dulera) 200-5 MCG/ACT Aerosol     Inhale 2 puffs into the lungs 2 (two) times daily     OneTouch Ultra test strip     Use As Directed     Notes:  Patient is to test twice a day,Fasting blood  glucose in AM and check before bedtime     Vitamin D, Cholecalciferol, 50 MCG (2000 UT) Cap     Take 2,000 Units by mouth every morning     warfarin (COUMADIN ) 5 MG tablet     Take 1.5 tablets (7.5 mg) by mouth every evening     Patient taking differently: Take 0.5 tablets (2.5 mg) by mouth every evening INR 2-3 goal         Notes:              Summary:    The following medication changes were documented in the prior to admission medication list:  The following medications were changed (reason if applicable):  Warfarin- decreased to 2.5 mg in the evening  The following medications were removed (reason if applicable):  Fluticasone  furoate vilanterol- patient never started due to insurance not covering   The  following medications were added:  Mometasone  Furo-Formoterol  Fum  Glucosamine- Chondroitin     Notes  -Warfarin- patient was taking 5 mg-3 days week (tue/thu/sun) and  7.5 mg 4 days week ( mon/wed/fri/sat) however patient was told to do 2.5 mg on Saturday and do not to take sun/mon until they call the clinic  on Tuesday for further instructions/        Patient demonstrates Good compliance with medications.  Patient demonstrates Good knowledge of medications.         Carl [name] Vincente

## 2023-10-09 NOTE — ED to IP RN Note (Signed)
 [facility] HOSPITAL EMERGENCY DEPT  ED NURSING NOTE FOR THE RECEIVING INPATIENT NURSE   ED [place], RN  [place], RN  [place], RN   [facility] [id number]   ED CHARGE RN Vertell NOVAK   ADMISSION INFORMATION   [place] is a 83 y.o. male admitted with an ED diagnosis of:    1. Influenza A    2. 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 Yes   Belongings Documented? No   Home medications sent to pharmacy confirmed? No   NURSING CARE   Patient Comes From:   Mental Status: Home Independent  alert and oriented   ADL: Needs assistance with ADLs   Ambulation: ambulates with: cane   Pertinent Information  and Safety Concerns:     [place] Violence Risk Level: Low Pt c/o SOB for awhile now, worse this morning. Pt family endorses observing severe SOB when walking into UC this morning as well as at night. Pt claims he cannot lay down without severe SOB. Denies any other symptoms. Hx T2DM, HTN, quadruple bypass with stent, gout, use of inhaler. + thinners     CT / NIH   CT Head ordered on this patient?  No   NIH/Dysphagia assessment done prior to admission? No   VITAL SIGNS (at the time of this note)      Vitals:    10/09/23 1600   BP: 118/64   Pulse: 98   Resp: 17   Temp:    SpO2:      Pain Score: 0-No pain (10/09/23 1146)

## 2023-10-09 NOTE — Progress Notes (Signed)
 [facility]  URGENT  CARE  PROGRESS NOTE     Patient: Carl [name]   Date: 10/09/2023   MRN: [id number]       Carl [name] is a 83 y.o. male      HISTORY     History obtained from: Patient    Chief Complaint   Patient presents with   . Shortness of Breath     Foe 3 days. History of COPD, lung cancer with pleural effusion and a catheter for drainage           Shortness of Breath     83 y/o male cc of SOB.  Pts son brings in the pt with SOB for 3 days.  Pt was recently admitted to [facility] for 10 days due to lung CA, COPD and pleural effusion.  Pt has a pigtail cath in the [facility] for the fluid.  Pt has had SOB and inability to sleep for 3 days.  Pt denies CP, F/C, N/V.  Review of Systems   Respiratory:  Positive for shortness of breath.      See HPI  History:    Pertinent Past Medical, Surgical, Family and Social History were reviewed.      Current Medications[1]    Allergies[2]    Medications and Allergies reviewed.    PHYSICAL EXAM     Vitals:    10/09/23 1043   BP: 112/61   BP Site: Left arm   Patient Position: Sitting   Pulse: (!) 110   Resp: 20   Temp: 97.4 F (36.3 C)   TempSrc: Oral   SpO2: 96%   Weight: 81.6 kg (180 lb)       Physical Exam  Constitutional:       General: He is in acute distress.      Appearance: Normal appearance. He is ill-appearing.   HENT:      Head: Normocephalic and atraumatic.     Eyes: Pupils are equal, round, and reactive to light. Cardiovascular:      Rate and Rhythm: Regular rhythm. Tachycardia present.   Pulmonary:      Effort: Pulmonary effort is normal.      Breath sounds: Normal breath sounds.   Neurological:      General: No focal deficit present.      Mental Status: He is alert and oriented to person, place, and time.   Skin:     General: Skin is warm and dry.      Capillary Refill: Capillary refill takes less than 2 seconds.   Vitals reviewed.       UCC COURSE     There were no labs reviewed with this patient during the visit.    There were no x-rays reviewed with this  patient during the visit.    Current Inpatient Medications with Last Dose Taken[3]    PROCEDURES     Procedures    MEDICAL DECISION MAKING     History, physical, labs/studies most consistent with SOB as the diagnosis.        Chart Review:  Prior PCP, Specialist and/or ED notes reviewed today: Not Applicable  Prior labs/images/studies reviewed today: Not Applicable    Differential Diagnosis: SOB, pneumonia, COPD, lung CA      ASSESSMENT     Encounter Diagnosis   Name Primary?   . Shortness of breath Yes                PLAN      PLAN: Pt  sent to ER in stable condition by EMS.              No orders of the defined types were placed in this encounter.    Requested Prescriptions      No prescriptions requested or ordered in this encounter       Discussed results and diagnosis with patient/family.  Reviewed warning signs for worsening condition, as well as, indications for follow-up with primary care physician and return to urgent care clinic.   Patient/family expressed understanding of instructions.     An After Visit Summary was provided to the patient.           [1]   Current Outpatient Medications:   .  albuterol  sulfate HFA (PROVENTIL ) 108 (90 Base) MCG/ACT inhaler, Inhale 2 puffs into the lungs, Disp: , Rfl:   .  allopurinol  (ZYLOPRIM ) 300 MG tablet, Take 1 tablet (300 mg) by mouth daily, Disp: 90 tablet, Rfl: 3  .  atorvastatin  (LIPITOR) 40 MG tablet, Take 1 tablet (40 mg) by mouth daily, Disp: 90 tablet, Rfl: 3  .  chlorthalidone  25 MG tablet, Take 0.5 tablets (12.5 mg) by mouth daily, Disp: 45 tablet, Rfl: 3  .  clobetasol (TEMOVATE) 0.05 % cream, Apply topically as needed, Disp: , Rfl:   .  dupilumab  (DUPIXENT ) 300 MG/2ML injection, Inject into the skin, Disp: , Rfl:   .  fluticasone  furoate-vilanterol (BREO ELLIPTA ) 200-25 MCG/ACT Aerosol Pwdr, Breath Activated, Inhale 1 puff into the lungs daily, Disp: 1 each, Rfl: 2  .  furosemide  (LASIX ) 20 MG tablet, Take 1 tablet (20 mg) by mouth daily, Disp: 180 tablet,  Rfl: 3  .  glipiZIDE  (GLUCOTROL ) 10 MG 24 hr tablet, Take 1 tablet (10 mg) by mouth daily, Disp: 30 tablet, Rfl: 5  .  Glucosamine-Chondroit-Vit C-Mn (GLUCOSAMINE 1500 COMPLEX PO), Take 2 tablets by mouth daily, Disp: , Rfl:   .  isosorbide  mononitrate (IMDUR ) 30 MG 24 hr tablet, Take 1 tablet (30 mg) by mouth daily, Disp: 90 tablet, Rfl: 3  .  metFORMIN  (GLUCOPHAGE -XR) 500 MG 24 hr tablet, Take 2 tablets (1,000 mg) by mouth 2 (two) times daily, Disp: 360 tablet, Rfl: 0  .  metoprolol  succinate (TOPROL -XL) 100 MG 24 hr tablet, Take 1 tablet (100 mg) by mouth daily, Disp: 90 tablet, Rfl: 3  .  OneTouch Ultra test strip, Use As Directed, Disp: 100 strip, Rfl: 3  .  Vitamin D, Cholecalciferol, 50 MCG (2000 UT) Cap, Take 2,000 Units by mouth daily (Patient not taking: Reported on 09/27/2023), Disp: , Rfl:   .  warfarin (COUMADIN ) 5 MG tablet, Take 1.5 tablets (7.5 mg) by mouth every evening (Patient taking differently: Take 1.5 tablets (7.5 mg) by mouth every evening 1/21 and 1/22 holding dose), Disp: 135 tablet, Rfl: 2  No current facility-administered medications for this visit.    Facility-Administered Medications Ordered in Other Visits:   .  0.9% NaCl infusion, , Intravenous, Continuous PRN, Posada Londono, Adriana M, MD, New Bag at 09/19/23 1345  .  phenylephrine  (NEOSYNEPHRINE) 10 mcg/mL pediatric IV syringe, , Intravenous, PRN, Posada Londono, Adriana M, MD, 100 mcg at 09/19/23 1459  .  propofol  (DIPRIVAN ) 10 mg/mL infusion (ADULT), , Intravenous, Continuous PRN, Posada Londono, Adriana M, MD, Stopped at 09/19/23 1445  .  propofol  (DIPRIVAN ) injection, , Intravenous, PRN, Posada Londono, Adriana M, MD, 20 mg at 09/19/23 1452  [2]   Allergies  Allergen Reactions   . Cephalexin Rash   [3]  No current facility-administered medications for this visit.   Facility-Administered Medications Ordered in Other Visits   Medication Dose Route Frequency Last Rate Last Admin   . 0.9% NaCl infusion   Intravenous Continuous PRN    New Bag at 09/19/23 1345   . phenylephrine  (NEOSYNEPHRINE) 10 mcg/mL pediatric IV syringe   Intravenous PRN   100 mcg at 09/19/23 1459   . propofol  (DIPRIVAN ) 10 mg/mL infusion (ADULT)   Intravenous Continuous PRN   Stopped at 09/19/23 1445   . propofol  (DIPRIVAN ) injection   Intravenous PRN   20 mg at 09/19/23 1452

## 2023-10-09 NOTE — Progress Notes (Signed)
 October 09, 2023    INR Goal:  2-3    Warfarin Indication: Paroxysmal AFib/AFlutter     CHA2DS2-VASc Score:  5 HTN (1), Age >/=75 (2), DM (1), and Vascular Dx (1)  HASBLED Score:  1 Elderly (Age>65)    Referring Provider: Dr. Lamar Platts     Referral Date:  09/06/22    Last visit with referring provider:  08/09/23    PMH:    Past Medical History:   Diagnosis Date    Diabetes mellitus     Hyperlipidemia     Hypertension      Current warfarin dose:    Instructed Dose Reported Dose   Warfarin 2.5mg  daily   No deviation from instructed dose.  Denies missed or extras dose(s).   Pt checked INR later than instructed at last visit   7-day warfarin total: 17.5 mg 7-day warfarin total: 17.5 mg      Lab Results   Component Value Date    INR 4.0 (A) 10/09/2023    INR 3.3 (A) 10/02/2023    INR 3.6 (A) 09/29/2023    INR 4.7 09/28/2023    INR 7.2 (HH) 09/26/2023    INR 5.7 (A) 09/26/2023     Dietary intake of vitamin K:  Typically 4-5 servings of green beans and salad (Spring mix, carrots, tomatoes, onions) per week. May have 1 serving of high vitamin K vegetables (collard greens) once monthly. Patient is in assisted living facility and eats whatever is served at the facility. No reported changes since last outreach.     Alcohol consumption: No alcohol consumption since 1989.      Tobacco use: Former, quit in 1989      Medication changes:  No changes.     New Medication/Dietary/Herbal Interactions: N/A    Concurrent significant interacting medications/supplements:    Medications that may enhance anticoagulant effect or bleeding risks:  APAP (patient takes prn <2000 mg/day)  Allopurinol   Glucosamine  Omega-3 fatty acids  Medications that may diminish anticoagulant effect:  Vitamin C  Metformin   MVI ([employer] brand, vitamin K 25 mcg)- (has resumed since d/c from hospital 09/20/23)     S/sx of bleeding/clotting:  Denies red or dark brown urine, red or black, tarry stool, vomiting or coughing up blood, unexplained bruising, bleeding  that doesn't stop or is very heavy.     Recent Falls: Denies.   Patient uses cane to assist with mobility.      Upcoming procedures/surgeries: Nothing scheduled at this time.     Is patient a candidate for DOAC?  Yes    If yes, date and outcome of discussion: 10/06/22: Per pharmacy liaison, copay for Eliquis is $771.30/90 days (periodic deductible $545+coinsurance $226.30). Once deductible met, Eliquis would be ~$75/month. Patient has Medicare and not eligible for copay card.  Patient prefers to continue warfarin for now.     Others:  Instructed pt to check INR in the morning M-Th.  INR received after 3pm will be addressed the following business day.  Instructed patient to call [facility] if no call received by 3PM the day after INR testing.     Best Contact:  Best time to reach patient is 8a-3pm. [phone]    INR Result:  Lab Results   Component Value Date    INR 4.0 (A) 10/09/2023     via home POCT   =======================================    A/P:  INR 4.0 remains above therapeutic range. INR consistently above range suggests pt requires less warfarin than  previous. Of note, pt had been in range with TWD of 45 mg/wk a few weeks ago. Also noted that patient's INR jumped to 6.0 with only 5 mg x2 days. Pt checked INR later than instructed at last visit.  Based on supratherapeutic INRs over the last month and recent reintroduction of Vit K supplements, will reduce total weekly dose to 17.5mg .  Hold warfarin today (2/3), then recheck INR. Instructed pt to take 2.5 mg if he does not hear from [facility] clinic by end of day tomorrow.  Recheck INR tomorrow on 10/10/23 via lab draw at cardiology appt  Instructed pt to go to ED if they experience red or dark brown urine, red or black, tarry stool, vomiting or coughing up blood, unexplained bruising, bleeding that doesn't stop or is very heavy.     Warfarin has been managed per Clinic Protocol    Patient verbalized understanding of instructions provided.  Interview conducted via  telephone.      Bernardino Robin, PharmD, [facility]  Clinical Pharmacy Specialist, Anticoagulation  Ph: [phone]

## 2023-10-09 NOTE — H&P (Signed)
 [facility]  Adult Medicine Hospitalists           History and Physical      Assessment / Plan:   Assessment & Plan     Mr. 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  He has the flu here but his main issue is rapid atrial fibrillation and decompensated heart failure.  Needs IV diuresis, a few days ahead.  He is on room air presently.     1)Acute on chronic diastolic heart failure:  with rapid afib, perhaps kicked off by the flu.  Bilateral b-lines, plump IVC at 2.7cm with <50% respirophasic variation, difficult cardiac windows and could not assess LV function well.  Lots of leg edema that is chronic but part of this picture all the same  -s/p lasix  20mg  in the ED  -resume his home thiazide-substitute HCTZ for his chlorthalidone   -start lasix  40mg  IV BID  -daily weights, I/O  -long term telemetry  -defer TTE  -consider cardiology evaluation pending clinical course    2)Atrial fibrillation with RVR: ruled out for PE  -resume home metoprolol  100mg  daily; will give an extra 50mg  dose this afternoon  -should be safe for diltiazem  if this is needed  -Long term telemetry as above  -takes coumadin -unclear why this medication but long standing, managing as below    3)Coumadin  management:  last INR at 4.0 2/1; has recentlyi been as  high as 7 and they have been holding his home doses for some days (unclear which days).  Have asked for an INR check now, then daily INRs.    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  CTA chest this admission shows some improvement in fluid size    7)HTN:  -continue metoprolol  as above  -continue thiazide as above  -continue imdur  30mg  daily    8)Gout:  chronic without acute flare  -continue home allopurinol  300mg  daily    9)Oropharyngeal cancer:  he is to start chemotherapy and immunotherapy in the coming weeks; has not had any treatments thus far    Chemical DVT ppx  FULL code  Telemetry needs:  long term as above          Additional Diagnoses:             Patient has BMI=Body mass index is 29.79 kg/m.  Diagnosis: Overweight based on BMI criteria                  Subjective:   Subjective   Mr. 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  He presents on 2/3 with concerns of progressive dyspnea on exertion in the several days preceding admission.  He has had increasing fluid in his legs, and has had difficulty sleeping with increasing  cough when laying down.  He cannot sleep flat.  He has been draining his PleurX every 3 days per schedule, with decreasing output the last couple of times.  He has not started chemotherapy yet for his new cancer      Past Medical / Surgical / Family / Social History:     Past Medical History:   Diagnosis Date    Diabetes mellitus     Hyperlipidemia     Hypertension      Past Surgical History[1]   Family History[2]  Social History[3]      Allergies and Home Medications:     Allergies[4]  Home Medications       Med List Status: In Progress Set By: Fleeta Catholic, Jordan R, RN at 10/09/2023  1:35 PM              albuterol  sulfate HFA (PROVENTIL ) 108 (90 Base) MCG/ACT inhaler     Inhale 2 puffs into the lungs     allopurinol  (ZYLOPRIM ) 300 MG tablet     Take 1 tablet (300 mg) by mouth daily     atorvastatin  (LIPITOR) 40 MG  tablet     Take 1 tablet (40 mg) by mouth daily     chlorthalidone  25 MG tablet     Take 0.5 tablets (12.5 mg) by mouth daily     clobetasol (TEMOVATE) 0.05 % cream     Apply topically as needed     dupilumab  (DUPIXENT ) 300 MG/2ML injection     Inject into the skin     furosemide  (LASIX ) 20 MG tablet     Take 1 tablet (20 mg) by mouth daily     glipiZIDE  (GLUCOTROL ) 10 MG 24 hr tablet     Take 1 tablet (10 mg) by mouth daily     Glucosamine-Chondroit-Vit C-Mn (GLUCOSAMINE 1500 COMPLEX PO)     Take 2 tablets by mouth daily     isosorbide  mononitrate (IMDUR ) 30 MG 24 hr tablet     Take 1 tablet (30 mg) by mouth daily     metFORMIN  (GLUCOPHAGE -XR) 500 MG 24 hr tablet     Take 2 tablets (1,000 mg) by mouth 2 (two) times daily     metoprolol  succinate (TOPROL -XL) 100 MG 24 hr tablet     Take 1 tablet (100 mg) by mouth daily     OneTouch Ultra test strip     Use As Directed     Vitamin D, Cholecalciferol, 50 MCG (2000 UT) Cap     Take 2,000 Units by mouth daily     Patient not taking: Reported on 09/27/2023     warfarin (COUMADIN ) 5 MG tablet     Take 1.5 tablets (7.5 mg) by mouth every evening     Patient taking differently: Take 1.5 tablets (7.5 mg) by mouth every evening 1/21 and 1/22 holding dose          Flagged for Removal               fluticasone  furoate-vilanterol (BREO ELLIPTA ) 200-25 MCG/ACT Aerosol Pwdr, Breath Activated     Inhale 1 puff into the lungs daily              Review of Systems:           Objective:   Objective   Patient Vitals for the past 24 hrs:   BP Temp Temp src Pulse Resp SpO2 Height Weight   10/09/23 1443 131/87 -- -- (!) 109 22 94 % -- --  10/09/23 1315 133/82 -- -- (!) 110 21 95 % -- --   10/09/23 1146 141/78 98 F (36.7 C) Oral (!) 118 20 95 % 1.651 m (5' 5) 81.2 kg (179 lb)      General: awake, alert, oriented x 3; no acute distress but uncomfortable  HEENT: anicteric sclerae, PERRL, oropharynx clear, MMM  Cardiovascular: irregular rhythm and tachycardic, normal S1 and S2; no murmurs,  rubs, or gallops  Lungs: rales bilaterally. Left sided PLeurx  Abdomen: soft, non-distended, no palpable masses, non-tender to palpation, no rebound or guarding  Extremities: warm, no LE edema; no clubbing or cyanosis      Chart Review:   The following chart items were reviewed as of 3:56 PM on 10/09/23:  []  Problem List  []  Current Orders   []  Discharge Planning  []  Current Medications []  Home Medications   []  Allergies  []  Results Review  []  Chart Review Labs  []  Chart Review Imaging  []  Past Medical History []  Surgical History   []  Immunizations  []  Family History  []  Social History   []  SDoH  []  Code Status  []  ACP Documents       Signed by: Donley JULIANNA Sprung, MD  [place] [place] [facility]  @[email or web address] @  After Hours Pager:  [phone])            [1]   Past Surgical History:  Procedure Laterality Date    CARDIAC SURGERY  2004    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    PLACEMENT, INDWELLING CATHETER PLEURAL Left 09/19/2023    Procedure: PLACEMENT, INDWELLING TUNNELED CATHETER, PLEURAL;  Surgeon: Maree Jinnie RAMAN, MD;  Location: [id number];  Service: Pulmonary;  Laterality: Left;  UNIVERSAL    SKIN BIOPSY      TONSILLECTOMY      ULTRASOUND, ENDOBRONCHIAL (EBUS) N/A 09/19/2023    Procedure: ULTRASOUND, ENDOBRONCHIAL (EBUS);  Surgeon: Maree Jinnie RAMAN, MD;  Location: [facility];  Service: Pulmonary;  Laterality: N/A;   [2]   Family History  Problem Relation Name Age of Onset    Lung cancer Brother     [3]   Social History  Tobacco Use    Smoking status: Former     Types: Cigarettes     Passive exposure: Past    Tobacco comments:     Quit smoking June 1989, smoked 30 years before   Vaping Use    Vaping status: Never Used   Substance Use Topics    Alcohol use: Not Currently    Drug use: Not Currently   [4]   Allergies  Allergen Reactions    Cephalexin Rash

## 2023-10-09 NOTE — ED Provider Notes (Signed)
 [facility] EMERGENCY DEPARTMENT  ATTENDING PHYSICIAN HISTORY AND PHYSICAL EXAM     Patient Name: Carl [name], Carl [name]  Department:[facility] EMERGENCY DEPT  Encounter Date:  10/09/2023  Attending Physician: Sherrilee Delanna LABOR, MD   Age: 83 y.o. 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       ED Disposition       ED Disposition   Admit    Condition   --    Date/Time   Mon Oct 09, 2023  4:30 PM    Comment   Admitting Physician: GARCIA, IVAN F [[id number]]   Service:: Medicine [[id number]]   Estimated Length of Stay: > or = to 2 midnights   Tentative Discharge Plan?: Home or Self Care [1]   Does patient need telemetry?: Yes   Is patient 18 yrs or greater?: Yes   Telemetry type (separate Telemetry order is also required):: Adult telemetry   Was admission to another facility considered?: No   Detail:: Other                 Follow-Up Providers (if applicable)    No follow-up provider specified.     Current Discharge Medication List              Medical Decision Making:     Initial Differential Diagnosis:  Initial differential diagnosis to include but not limited to: worsening malignancy, PE, pneumonia, pleural effusion, influenza, COVID-19, CHF    Plan:  CBC, metabolic panel, flu and COVID testing, proBNP, high-sensitivity troponin  CT angio chest  EKG    Final Impression:  Influenza A  Shortness of breath  The patient was admitted and handed off to Dr. Donley Sprung. We discussed all aspects of the work-up that had been completed in the ED, any pending studies/therapies, and all clinical decision making at the time care was handed off.        ED Medication Orders (From admission, onward)      Start Ordered     Status Ordering Provider    10/10/23 0900 10/09/23 1632  allopurinol  (ZYLOPRIM ) tablet 300 mg  Daily        Route: Oral  Ordered Dose: 300 mg       Last MAR action: Given GARCIA, IVAN F    10/10/23 0900  10/09/23 1632  atorvastatin  (LIPITOR) tablet 40 mg  Daily        Route: Oral  Ordered Dose: 40 mg       Last MAR action: Given GARCIA, IVAN F    10/10/23 0900 10/09/23 1632  hydroCHLOROthiazide  (HYDRODIURIL ) tablet 25 mg  Daily        Route: Oral  Ordered Dose: 25 mg       Last MAR action: Given GARCIA, IVAN F    10/10/23 0900 10/09/23 1632  metoprolol  succinate XL (TOPROL -XL) 24 hr tablet 100 mg  Daily        Route: Oral  Ordered Dose: 100 mg       Last MAR action: Given GARCIA, IVAN F    10/10/23 0900 10/09/23 1632  isosorbide  mononitrate (IMDUR ) 24 hr tablet 30 mg  Daily        Route: Oral  Ordered Dose: 30 mg       Last MAR action: Given GARCIA, IVAN F    10/09/23 1930 10/09/23  1657  metoprolol  succinate XL (TOPROL -XL) 24 hr tablet 25 mg  Daily        Route: Oral  Ordered Dose: 25 mg       Last MAR action: Given GARCIA, IVAN F    10/09/23 1830 10/09/23 1633  oseltamivir  (TAMIFLU ) capsule 75 mg  Every 12 hours        Route: Oral  Ordered Dose: 75 mg       Last MAR action: Given GARCIA, IVAN F    10/09/23 1700 10/09/23 1630  furosemide  (LASIX ) injection 40 mg  2 times daily at 0800 and 1400        Route: Intravenous  Ordered Dose: 40 mg       Last MAR action: Given GARCIA, IVAN F    10/09/23 1633 10/09/23 1632    Daily        Route: Oral  Ordered Dose: 30 mg       Discontinued GARCIA, IVAN F    10/09/23 1633 10/09/23 1632    RT - Every morning        Route: Inhalation  Ordered Dose: 1 puff       Discontinued GARCIA, IVAN F    10/09/23 1628 10/09/23 1630  dextrose  (GLUCOSE) 40 % oral gel 15 g of glucose  As needed        Route: Oral  Ordered Dose: 15 g of glucose      Placed in Or Linked Group    Acknowledged GARCIA, IVAN F    10/09/23 1628 10/09/23 1630  dextrose  (D10W) 10% bolus 125 mL  As needed        Route: Intravenous  Ordered Dose: 12.5 g      Placed in Or Linked Group    Acknowledged GARCIA, IVAN F    10/09/23 1628 10/09/23 1630  dextrose  50 % bolus 12.5 g  As needed        Route: Intravenous  Ordered  Dose: 12.5 g      Placed in Or Linked Group    Acknowledged GARCIA, IVAN F    10/09/23 1628 10/09/23 1630  glucagon  (rDNA) (GLUCAGEN) injection 1 mg  As needed        Route: Intramuscular  Ordered Dose: 1 mg      Placed in Or Linked Group    Acknowledged GARCIA, IVAN F    10/09/23 1628 10/09/23 1630  acetaminophen  (TYLENOL ) tablet 650 mg  Every 4 hours PRN        Route: Oral  Ordered Dose: 650 mg       Acknowledged GARCIA, IVAN F    10/09/23 1628 10/09/23 1630  melatonin tablet 3 mg  At bedtime PRN        Route: Oral  Ordered Dose: 3 mg       Acknowledged GARCIA, IVAN F    10/09/23 1628 10/09/23 1630  saline (OCEAN NASAL SPRAY) 0.65 % nasal solution 2 spray  Every 4 hours PRN        Route: Each Nare  Ordered Dose: 2 spray       Acknowledged GARCIA, IVAN F    10/09/23 1628 10/09/23 1630  carboxymethylcellulose (PF) (REFRESH PLUS) 0.5 % ophthalmic solution 1 drop  3 times daily PRN        Route: Both Eyes  Ordered Dose: 1 drop       Acknowledged GARCIA, IVAN F    10/09/23 1628 10/09/23 1630  benzocaine -menthol  (CEPACOL/CHLORASEPTIC) lozenge 1 lozenge  Every 2 hours PRN  Route: Buccal  Ordered Dose: 1 lozenge       Acknowledged GARCIA, IVAN F    10/09/23 1628 10/09/23 1630  benzonatate  (TESSALON ) capsule 100 mg  3 times daily PRN        Route: Oral  Ordered Dose: 100 mg       Acknowledged GARCIA, IVAN F    10/09/23 1628 10/09/23 1630  magnesium  sulfate 1g in dextrose  5% 100mL IVPB (premix)  As needed        Route: Intravenous  Ordered Dose: 1 g       Last MAR action: Not Given GARCIA, IVAN F    10/09/23 1628 10/09/23 1630  potassium chloride  (KLOR-CON  M20) CR tablet 0-60 mEq  As needed        Route: Oral  Ordered Dose: 0-60 mEq      Placed in Or Linked Group    Acknowledged GARCIA, IVAN F    10/09/23 1628 10/09/23 1630  potassium chloride  (KLOR-CON ) packet 0-60 mEq  As needed        Route: Oral  Ordered Dose: 0-60 mEq      Placed in Or Linked Group    Acknowledged GARCIA, IVAN F    10/09/23 1628 10/09/23  1630  potassium chloride  10 mEq in 100 mL IVPB (premix)  As needed        Route: Intravenous  Ordered Dose: 10 mEq      Placed in Or Linked Group    Acknowledged GARCIA, IVAN F    10/09/23 1628 10/09/23 1630  potassium & sodium phosphates  (PHOS-NAK) 280-160-250 MG packet 2 packet  As needed        Route: Oral  Ordered Dose: 2 packet       Acknowledged GARCIA, IVAN F    10/09/23 1628 10/09/23 1630  naloxone  (NARCAN ) injection 0.2 mg  As needed        Route: Intravenous  Ordered Dose: 0.2 mg       Acknowledged GARCIA, IVAN F    10/09/23 1436 10/09/23 1436  iohexol  (OMNIPAQUE ) 350 MG/ML injection 80 mL  IMG once as needed        Route: Intravenous  Ordered Dose: 80 mL       Last MAR action: Imaging Agent Given Julion Gatt A    10/09/23 1224 10/09/23 1223  furosemide  (LASIX ) injection 20 mg  Once        Route: Intravenous  Ordered Dose: 20 mg       Last MAR action: Given Tamela Elsayed A            Medical Decision Making  Amount and/or Complexity of Data Reviewed  Labs: ordered.  Radiology: ordered.  ECG/medicine tests: ordered.    Risk  Prescription drug management.  Decision regarding hospitalization.    I admitted this patient for the flu, SOB with exertion, orthopnea, tachycardia. His O2 sat drops intermittently to 90%, but otherwise he has not been hypoxic. He has had to sit upright for the past two nights due to orthopnea, cannot ambulate to the bathroom due to DOE. He does have aifb, took his meds today. Work-up shows Flu A, BNP approx 2400, WBC approx 16. No PE on CT angio chest. Also has SCC of oropharynx, on chemo and immunotherapy.                         History of Presenting Illness:     Nursing Triage note: pt BIBA from UC for  further eval of SOB for a while but worsening today alongside generalized weakness. Per son at bedside pt was coughing a lot, so much he threw up pt denies sputum production or hemoptysis. Pt reports recent diagnosis of lung CA, had not started chemo or radiation. hx a.  flutter, CHF and COPD. pt son at bedside reports concern over pt losing over 20 lbs since THanksgibing, concern for FTT. Pt denies n/v/d, fever/chills at this time. Pt denies sick contacts. breathing unlabored, able to speak full sentences w/o distress  Chief complaint: Shortness of Breath    Tamarick [name] is a 83 y.o. male with PMH of HTN, HLD, DM, COPD, emphysema, CAD, CHF (on lasix ), paroxysmal afib (on warfarin), and squamous cell carcinoma of the oropharynx who presents with shortness of breath and fatigue. The fatigue has been progressively worsening over the last few months and seemed to worsen more today. He has been short of breath since January 1st. SOB worsens when laying down but he has been able to sleep by sitting up. Over the last few nights, he has been SOB even when sitting up and has had difficulty sleeping. Now in the ED, SOB feels better than this morning.  No fever or chills.    He has had leg swelling chronically for a long time. Legs seem to have worsened over the last few days which he thinks is from difficulty laying down/ elevating his legs at night. He has a drain in his left chest that has been in place for ~3 weeks, which was reevaluated by pulmonology ~3-4 days ago.        Review of Systems:  Physical Exam:     Review of Systems    Positive and negative ROS per above and in HPI. All other systems reviewed and negative.     Pulse (!) 118  BP 141/78  Resp 20  SpO2 95 %  Temp 98 F (36.7 C)       Constitutional: Vital signs reviewed. Nontoxic appearing.  Head: Normocephalic, atraumatic  Eyes: No conjunctival injection. No discharge. No scleral icterus.  ENT: Mucous membranes moist.  Respiratory/Chest: Diminished breath sounds on the left, pleural drain in place on the left. Crackles in the right lung base. Tachypneic.  Cardiovascular: Irregularly irregular rhythm. Tachycardic. No murmur appreciated.   Abdomen: Normal bowel sounds. Soft and non-tender. No guarding. No masses  or hepatosplenomegaly.  Upper Extremity: No edema. No cyanosis. No gross deformities.  Lower Extremity: Bilateral lower extremity pitting edema. No cyanosis. No gross deformities.  Neurological: No focal motor deficits by observation. Speech normal. Alert and oriented.  Skin: Warm and well-perfused, dry. No rash.            Interpretations, Clinical Decision Tools and Critical Care:               EKG: I reviewed and independently interpreted the patient's EKG as: Rapid atrial fibrillation at 108 bpm, narrow complexes, no acute ischemic changes.           Radiology: All available radiologist's interpretations were reviewed if not separately interpreted by me.         O2 Sat:  The patient's oxygen saturation was 95 % on room air. This was independently interpreted by me as Normal.   Cardiac Monitoring: I independely reviewed and interpreted the patient's rhythm strip as atrial fibrillation at 118 bpm.                  Procedures:  Procedures      Attestations:     Production Assistant, Radio: I was acting as a neurosurgeon for [employer], Daran Favaro A, MD on [name]   Treatment Team: Scribe: Lorene Marseille R    Documentation Notes:  Parts of this note were generated by the Epic EMR system/ Dragon speech recognition and may contain inherent errors or omissions not intended by the user. Grammatical errors, random word insertions, deletions, pronoun errors and incomplete sentences are occasional consequences of this technology due to software limitations. Not all errors are caught or corrected.  My documentation is often completed after the patient is no longer under my clinical care. In some cases, the Epic EMR may pull updated results into the above documentation which may not reflect all results or information that were available to me at the time of my medical decision making.   If there are questions or concerns about the content of this note or information contained within the body of this dictation they should be  addressed directly with the author for clarification.                  Annalysse Shoemaker A, MD  10/10/23 1140

## 2023-10-09 NOTE — ED Triage Notes (Addendum)
 Pt c/o SOB for awhile now, worse this morning. Pt family endorses observing severe SOB when walking into UC this morning as well as at night. Pt claims he cannot lay down without severe SOB. Denies any other symptoms. Hx T2DM, HTN, quadruple bypass with stent, gout, use of inhaler. + thinners

## 2023-10-10 ENCOUNTER — Telehealth: Payer: Self-pay

## 2023-10-10 ENCOUNTER — Encounter (INDEPENDENT_AMBULATORY_CARE_PROVIDER_SITE_OTHER): Payer: Self-pay

## 2023-10-10 ENCOUNTER — Ambulatory Visit (INDEPENDENT_AMBULATORY_CARE_PROVIDER_SITE_OTHER): Payer: Medicare Other

## 2023-10-10 ENCOUNTER — Encounter: Payer: Self-pay | Admitting: Medical Oncology

## 2023-10-10 ENCOUNTER — Telehealth (INDEPENDENT_AMBULATORY_CARE_PROVIDER_SITE_OTHER): Payer: Self-pay | Admitting: Internal Medicine

## 2023-10-10 LAB — PT/INR
INR: 7 (ref 0.9–1.1)
PT: 77.7 s — ABNORMAL HIGH (ref 10.1–12.9)

## 2023-10-10 LAB — BASIC METABOLIC PANEL
Anion Gap: 11 (ref 5.0–15.0)
BUN: 26 mg/dL (ref 9–28)
CO2: 29 meq/L (ref 17–29)
Calcium: 10.6 mg/dL — ABNORMAL HIGH (ref 7.9–10.2)
Chloride: 98 meq/L — ABNORMAL LOW (ref 99–111)
Creatinine: 0.7 mg/dL (ref 0.5–1.5)
GFR: >60 mL/min/{1.73_m2} (ref >=60.0)
Glucose: 135 mg/dL — ABNORMAL HIGH (ref 70–100)
Potassium: 4.2 meq/L (ref 3.5–5.3)
Sodium: 138 meq/L (ref 135–145)

## 2023-10-10 LAB — CBC
Absolute nRBC: 0 10*3/uL (ref <=0.00)
Hematocrit: 38.4 % (ref 37.6–49.6)
Hemoglobin: 12.9 g/dL (ref 12.5–17.1)
MCH: 30.4 pg (ref 25.1–33.5)
MCHC: 33.6 g/dL (ref 31.5–35.8)
MCV: 90.6 fL (ref 78.0–96.0)
MPV: 11.7 fL (ref 8.9–12.5)
Platelet Count: 234 10*3/uL (ref 142–346)
RBC: 4.24 10*6/uL (ref 4.20–5.90)
RDW: 15 % (ref 11–15)
WBC: 16.04 10*3/uL — ABNORMAL HIGH (ref 3.10–9.50)
nRBC %: 0 /100{WBCs} (ref <=0.0)

## 2023-10-10 LAB — MAGNESIUM: Magnesium: 1.5 mg/dL — ABNORMAL LOW (ref 1.6–2.6)

## 2023-10-10 MED ORDER — DILTIAZEM HCL 30 MG PO TABS
30.0000 mg | ORAL_TABLET | Freq: Four times a day (QID) | ORAL | Status: DC
Start: 2023-10-10 — End: 2023-10-10

## 2023-10-10 MED ORDER — ALBUTEROL-IPRATROPIUM 2.5-0.5 (3) MG/3ML IN SOLN
3.0000 mL | Freq: Once | RESPIRATORY_TRACT | Status: AC
Start: 2023-10-10 — End: 2023-10-10
  Administered 2023-10-10: 3 mL via RESPIRATORY_TRACT
  Filled 2023-10-10: qty 3

## 2023-10-10 NOTE — Plan of Care (Addendum)
 RN Shift Note and Trio Rounds template  Date Time: 10/10/23 4:43 AM  Patient Name: Carl [name], Carl [name]  Room No: QN417/QN417.98  Admit Date: 10/09/2023  Length of Stay: 1  Attending Physician: Venora Damian ORN, MD   Code Status:Full Code  Admit Status: Inpatient  Shift Note:    Plan:  Antiviral, Monitor labs, monitor LLL drainage catheter , strict I's and O's, daily weights             Trio Rounds Template:    10/10/23    Overnight Events/ Updates: (Call to Physician, RRT, new symptoms, Changes to exam per RN)  Overnight Events: Tele Event 14 beats of SVT  . No symptoms noted. D. Vlach made aware. Recommended to monitor K and Mg.    Orientation:  AOX4   Assessment:     Vital Signs: (abnormal and pain is the 5th vital sign)      Pain/VS: 0-No pain    Weight: (Trending, I&O's, Net, diuresing)                                       Respiratory: (increase or decreased O2 demands, O2 needed at home)   On 1 L oxygen, patient verbalized he was having a hard time breathing Oxygen at 95% on 1L. No respiratory distress noted   GI: Bowel concerns   Lbm 2/3   GU: Bladder/urine concerns  Does Patient have Foley Catheter    Ambulates to bathroom and uses urinal at night. Needs to stand to use urinal.    Diet:      Most Recent diet: Adult diet Therapeutic/ Modified; Solid; Regular (IDDSI level 7); Thin (IDDSI level 0); Heart healthy  Previous diet: Adult diet Regular    Patient has abnormal labs (K, Mg, hb, Cr, LFT's, Cultures resulted in last 24 hours)    Inr 7.0   Patient had Imaging resulted in 24 hours   CT Angio Chest (PE or Trauma Protocol)    Result Date: 10/09/2023   1. No CT evidence of pulmonary embolism. 2. Pleural-based mass and bilateral pulmonary nodules are increased in size 3. Interim insertion of left-sided chest tube with decreased loculated left-sided pleural effusion J. Charlie Hoe, MD 10/09/2023 2:46 PM     Antibiotics:      Anticoagulants:   Patient on coumadin  PTA    Activity Safety:     PT/OT, sitters               Yes     Concerns about Mental Status and ADL's                                       Psychosocial:           Psychosocial:     Patient/family have questions on plan of care/ discharge plans  No concerns/questions on plan of care     Medication history updated     Dispense report 100 days   Unit Protocols:     Patient has [address]                                             Fall risk  Patient has Pressure Ulcer or at Risk                           Glucose less than 100 and on insulin                           Readmission in last 30 days        Patient transferred last night from another unit                  4:43 AM   10/10/23     Problem: Moderate/High Fall Risk Score >5  Goal: Patient will remain free of falls  Outcome: Progressing  Flowsheets (Taken 10/10/2023 0000)  Moderate Risk (6-13):   MOD-Consider a move closer to Nurses Station   MOD-Apply bed exit alarm if patient is confused   LOW-Fall Interventions Appropriate for Low Fall Risk   LOW-Anticoagulation education for injury risk     Problem: Compromised Moisture  Goal: Moisture level Interventions  Outcome: Progressing  Flowsheets (Taken 10/10/2023 0012)  Moisture level Interventions: Moisture wicking products, Moisture barrier cream     Problem: Compromised Activity/Mobility  Goal: Activity/Mobility Interventions  Outcome: Progressing  Flowsheets (Taken 10/10/2023 0012)  Activity/Mobility Interventions: Pad bony prominences, TAP Seated positioning system when OOB, Promote PMP, Reposition q 2 hrs / turn clock, Offload heels     Problem: Day of Admission - Heart Failure  Goal: Heart Failure Admission  Outcome: Progressing  Flowsheets (Taken 10/10/2023 0012)  Heart Failure Admission:   Standing Weight on admission, if unable to stand zero the bed and use the bed scale   Vital signs and telemetry per policy   Strict Intake/Output   Oxygen as needed   Assess for swelling/edema and document    Initiate education with patient and caregiver using CHF Warning Zones and Educational Videos ([employer] or [employer])     Problem: Everyday - Heart Failure  Goal: Stable Vital Signs and Fluid Balance  Flowsheets (Taken 10/10/2023 0012)  Stable Vital Signs and Fluid Balance:   Strict Intake/Output   Monitor, assess vital signs and telemetry per policy   Assess for swelling/edema   Wean oxygen as needed if appropriate   Fluid Restriction  Goal: Mobility/Activity is Maintained at Optimal Level for Patient  Outcome: Progressing  Flowsheets (Taken 10/10/2023 0012)  Mobility/Activity is Maintained at Optimal Level for Patient:   Increase mobility as tolerated/progressive mobility protocol   Reposition patient every 2 hours and as needed unless able to reposition self   Assess for changes in respiratory status, level of consciousness and/or development of fatigue   Maintain SCD's as Ordered   Perform active/passive ROM   Consult with Physical Therapy and/or Occupational Therapy  Goal: Nutritional Intake is Adequate  Outcome: Progressing  Flowsheets (Taken 10/10/2023 0012)  Nutritional Intake is Adequate:   Assess appetite,anorexia and amount of meal/food tolerated   Encourage/perform oral hygiene as appropriate   Consult/Collaborate with Nutritionist  Goal: Teaching-Using CHF Warning Zones and Educational Videos  Outcome: Progressing  Flowsheets (Taken 10/10/2023 0012)  Teaching-Using CHF Warning Zones and Educational Videos:   Signs & Symptoms of CHF   Daily Standing Weights & record   CHF Warning Zones and when to call for help   Medications   Document in the Education Tab in EPIC with Teach-back     Problem: Inadequate Gas Exchange  Goal: Adequate oxygenation and improved ventilation  Outcome: Progressing  Flowsheets (Taken 10/10/2023 0012)  Adequate oxygenation and improved ventilation:   Assess lung sounds   Monitor SpO2 and treat as needed   Provide mechanical and oxygen support to facilitate gas exchange   Position for  maximum ventilatory efficiency   Teach/reinforce use of incentive spirometer 10 times per hour while awake, cough and deep breath as needed   Plan activities to conserve energy: plan rest periods   Increase activity as tolerated/progressive mobility   Consult/collaborate with Respiratory Therapy  Goal: Patent Airway maintained  Outcome: Progressing  Flowsheets (Taken 10/10/2023 0012)  Patent airway maintained:   Position patient for maximum ventilatory efficiency   Reinforce use of ordered respiratory interventions (i.e. CPAP, BiPAP, Incentive Spirometer, Acapella, etc.)   Provide adequate fluid intake to liquefy secretions   Reposition patient every 2 hours and as needed unless able to self-reposition

## 2023-10-10 NOTE — PT Eval Note (Signed)
 Physical Therapy Evaluation  Carl [name]      Post Acute Care Therapy Recommendations:     Discharge Recommendations:  Home with supervision, Home with home health PT    D/C Milestones: increased ambulation distance Anticipate achievement in 1-2 sessions    DME needs IF patient is discharging home: Shower chair, Wheelchair-manual (for energy conservation)     Therapy discharge recommendations may change with patient status.  Please refer to most recent note for up-to-date recommendations.    Assessment:   Significant Findings: none    Carl [name] is a 83 y.o. male admitted 10/09/2023 with SOB. Pt able to perform functional mobility and ambulation with CGA - no significant LOB or gait deviations noted. Ambulated short distances via FWW - pt reports low activity tolerance and requires prolonged seated rest break after ambulating. Educated on using PLB to decrease RR - SpO2 levels within normal range. Educated pt on increasing the use of his rollator for ambulation for increased stability and energy conservation - pt has a tendency to furniture walk within household.  Pt position back in bed with all belongings and all questions/concerns answered. Pt presents with decreased functional mobility, impaired gait and decreased activity tolerance. Pt would continue to benefit from skilled PT to maximize functional mobility and independence.     Impairments: Assessment: Decreased UE strength;Decreased LE strength;Decreased safety/judgement during functional mobility;Decreased endurance/activity tolerance;Decreased functional mobility;Decreased balance;Gait impairment.     Therapy Diagnosis: gait impairment    Rehabilitation Potential: Prognosis: Good;With continued PT status post acute discharge    Treatment Activities: PT evaluation. Gait training    Educated the patient to role of physical therapy, plan of care, goals of therapy and HEP, safety with mobility and ADLs, energy conservation techniques,  home safety.    Plan:   Treatment/Interventions: Exercise, Gait training, Neuromuscular re-education, Functional transfer training, LE strengthening/ROM, Endurance training, Patient/family training, Equipment eval/education, Compensatory technique education     PT Frequency: 3-4x/wk   Risks/Benefits/POC Discussed with Pt/Family: With patient     Unit: [facility] ORIG BLDG 5 WEST  Bed: FO582/FO582.01     Precautions and Contraindications:   Weight Bearing Status: no restrictions  Other Precautions: droplet    Consult received for Carl [name] for PT Evaluation and Treatment.  Patient's medical condition is appropriate for Physical therapy intervention at this time.    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  He has the flu here but his main issue is rapid atrial fibrillation and decompensated heart failure.  Needs IV diuresis, a few days ahead.  He is on room air presently. Per chart review     Past Medical/Surgical History:  Past Surgical History[1]     Medical History[2]     X-Rays/Tests/Labs:  CT Angio Chest (PE or Trauma Protocol)    Result Date: 10/09/2023   1. No CT evidence of pulmonary embolism. 2. Pleural-based mass and bilateral pulmonary nodules are increased in size 3. Interim insertion of left-sided chest tube with decreased loculated left-sided pleural effusion J. Charlie Hoe, MD 10/09/2023 2:46 PM    XR Chest AP Portable  Result Date: 10/05/2023   Slight decrease in left pleural effusion since prior study Jeana Sor, MD 10/05/2023 3:01 PM    XR Chest AP Portable    Result Date: 09/19/2023  1. New left  pleural catheter. No pneumothorax identified. 2. Mild vascular congestion with left pleural effusion and basilar airspace opacity, not significantly changed. Levada Cinnamon, MD 09/19/2023 4:20 PM    CT Abdomen Pelvis W IV/ WO PO Cont    Result Date: 09/17/2023  1. Extensive metastatic malignancy in the chest, described in full on earlier chest CT. 2. No potential primary tumor detectable in the abdomen or pelvis. 3. Small left adrenal gland nodule represents potential metastasis. 4. Moderate stool distention colon and rectum. 5. Remainder as above. Wolm Luis, MD 09/17/2023 11:54 AM    MRI Brain W WO Contrast    Result Date: 09/17/2023   1.No evidence of intracranial metastases. Alex Jude Crawford Ruth, MD 09/17/2023 10:20 AM    CT Angiogram Chest    Result Date: 09/15/2023  1. Negative for pulmonary embolism. 2. Extensive left pleural nodularity, pericardial thickening and an enlarged left anterior cardiophrenic lymph node compatible with metastatic disease. 3. Persistent left pleural effusion, slightly decreased. Persistent almost complete left lower lobe atelectasis. 4. Indeterminate left adrenal nodule. Theopolis Rowan, MD 09/15/2023 6:27 PM    XR Chest AP Portable    Result Date: 09/15/2023  Interval progression of a left-sided pleural effusion with associated atelectasis and/or pneumonia. Lindie Cunning, MD 09/15/2023 5:51 AM    XR Chest AP Portable    Result Date: 09/14/2023  Unchanged small to moderate left pleural effusion. Theopolis Rowan, MD 09/14/2023 4:21 PM    XR Chest AP Portable    Result Date: 09/12/2023  1.Slight improvement of moderate to large left pleural effusion/atelectasis. Superimposed pneumonia is less likely, but cannot be excluded. 2.Stable retrocardiac atelectasis. Lillette CHRISTELLA Gillis, MD 09/12/2023 12:42 PM    CT Angio Chest (PE or trauma protocol)    Result Date: 09/11/2023  1.No acute pulmonary emboli. 2.Increased moderate to large left pleural effusion with associated compressive atelectasis of the left lower lobe  and lingula. 3.Multiple enlarging pulmonary nodules and enlarging left anterior cardiophrenic mass/nodal mass, highly suspicious for metastases. Garnette Rouse, MD 09/11/2023 1:08 AM    XR Chest  AP Portable    Result Date: 09/10/2023  Moderate left pleural effusion, increased compared to December 18. Hildegard Bayard, MD 09/10/2023 8:04 PM       Social History:   Prior Level of Function:  Prior level of function: Independent with ADLs, Ambulates with assistive device  Assistive Device: Four wheel walker, Single point cane (SPC for short distances and rollator out in the community)  Baseline Activity Level: Household ambulation  Ambulated 100 feet or more prior to admission: Yes  Driving: independent  DME Currently at Home: ADL- Grab Bars, [place], Single [place], [place], Four Wheel    Home Living Arrangements:  Living Arrangements: Children (pt reports his son is staying with him)  Type of Home: Apartment  Home Layout: One level, Able to live on main level with bedroom/bathroom, Performs ADL's on one level (no STE)  Bathroom Shower/Tub: Pension Scheme Manager: Midwife: Grab bars in shower, Grab bars around toilet  DME Currently at Home: ADL- Grab Bars, [place], Single [place], [place], Four Wheel    Subjective:   Patient is agreeable to participation in the therapy session. Nursing clears patient for therapy.     That sounds like a good  idea.  Patient Goal: To get better    Pain Assessment  Pain Assessment: No/denies pain    Objective:   Observation of Patient/Vital Signs:  Patient is in bed with telemetry and peripheral IV, O2 at 2 liters/minute via nasal cannula in place.  Pt wore mask during therapy session:No      Observation of Patient/Vital signs:  Inspection/Posture: seated EOB eating breakfast, PIV, 2L/min via NC    Cognition/Neuro Status  Arousal/Alertness: Appropriate responses to stimuli  Attention Span: Appears intact  Orientation Level: Oriented X4  Memory: Appears intact  Following Commands:  Follows all commands and directions without difficulty  Safety Awareness: minimal verbal instruction  Insights: Fully aware of deficits;Educated in safety awareness  Problem Solving: supervision  Behavior: calm;cooperative;attentive  Motor Planning: intact  Coordination: intact  Hand Dominance: right handed      Musculoskeletal Examination:  Gross ROM  Right Upper Extremity ROM: within functional limits  Left Upper Extremity ROM: within functional limits  Right Lower Extremity ROM: within functional limits  Left Lower Extremity ROM: within functional limits    Gross Strength  Right Upper Extremity Strength: 4/5  Left Upper Extremity Strength: 4/5  Right Lower Extremity Strength: 4+/5  Left Lower Extremity Strength: 4+/5      Sensation  Denies N/T   Intact to LT    Coordination  Finger opposition intact     Vision  Wears glasses for distance/reading   No acute changes reported     Auditory   WNL    Functional Mobility:  Sit to Stand: Risk Analyst  Stand to Sit: Contact Guard Assist    Ambulation:  PMP - Progressive Mobility Protocol   PMP Activity: Step 6 - Walks in Room  Distance Walked (ft) (Step 6,7): 30 Feet     Ambulation: Contact Guard Assist;with front-wheeled walker  Pattern: R foot decreased clearance;L foot decreased clearance;Step through;decreased step length;decreased cadence       Balance:  Sitting - Static: Good  Sitting - Dynamic: Good  Standing - Static: Good  Standing - Dynamic: Fair      Participation and Activity Tolerance:  Participation Effort: good  Endurance: Tolerates 10 - 20 min exercise with multiple rests      Patient left with call bell within reach, all needs met, SCDs off as found, fall mat up as found, bed alarm off as found, chair alarm off and all questions answered. RN notified of session outcome and patient response.       Goals:   Goals  Goal Formulation: With patient  Time for Goal Acheivement: 5 visits  Pt Will Go Supine To Sit: with supervision, to maximize functional  mobility and independence  Pt Will Perform Sit to Stand: with supervision, to maximize functional mobility and independence  Pt Will Demo Standing Balance: 4/5 indep, moves/returns center of grav 1-2/1 plane, to maximize functional mobility and independence  Pt Will Transfer Bed/Chair: with supervision, with rolling walker, to maximize functional mobility and independence  Pt Will Ambulate: with supervision, with rolling walker, 51-100 feet, to maximize functional mobility and independence      Time of treatment:   PT Received On: 10/10/23   Start Time: 0910   Stop Time: 0940   Time Calculation (min): 30 min    PPE worn during session: procedural mask and gloves    Tech present: n/a  PPE worn by tech: N/A    Lacinda Saras, PT, DPT  Pager [phone]         [  1]   Past Surgical History:  Procedure Laterality Date    CARDIAC SURGERY  2004    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    PLACEMENT, INDWELLING CATHETER PLEURAL Left 09/19/2023    Procedure: PLACEMENT, INDWELLING TUNNELED CATHETER, PLEURAL;  Surgeon: Maree Jinnie RAMAN, MD;  Location: [id number];  Service: Pulmonary;  Laterality: Left;  UNIVERSAL    SKIN BIOPSY      TONSILLECTOMY      ULTRASOUND, ENDOBRONCHIAL (EBUS) N/A 09/19/2023    Procedure: ULTRASOUND, ENDOBRONCHIAL (EBUS);  Surgeon: Maree Jinnie RAMAN, MD;  Location: [facility];  Service: Pulmonary;  Laterality: N/A;   [2]   Past Medical History:  Diagnosis Date    Diabetes mellitus     Hyperlipidemia     Hypertension

## 2023-10-10 NOTE — Progress Notes (Signed)
 Date Time: 10/10/23 4:43 AM  Patient Name: Carl [name], Carl [name]  Room No: QN417/QN417.98  Admit Date: 10/09/2023  Length of Stay: 1  Attending Physician: Venora Damian ORN, MD   Code Status:Full Code  Admit Status: Inpatient  Shift Note:     Plan:  Antiviral, Monitor labs, monitor LLL drainage catheter , strict I's and O's, daily weights               Trio Rounds Template:     10/10/23    Overnight Events/ Updates: (Call to Physician, RRT, new symptoms, Changes to exam per RN)  Overnight Events: Tele Event 14 beats of SVT  . No symptoms noted. D. Vlach made aware. Recommended to monitor K and Mg.    Orientation:  AOX4   Assessment:      Vital Signs: (abnormal and pain is the 5th vital sign)      Pain/VS: 0-No pain    Weight: (Trending, I&O's, Net, diuresing)                                       Respiratory: (increase or decreased O2 demands, O2 needed at home)   On 1 L oxygen, patient verbalized he was having a hard time breathing Oxygen at 95% on 1L. No respiratory distress noted   GI: Bowel concerns   Lbm 2/3   GU: Bladder/urine concerns  Does Patient have Foley Catheter    Ambulates to bathroom and uses urinal at night. Needs to stand to use urinal.    Diet:      Most Recent diet: Adult diet Therapeutic/ Modified; Solid; Regular (IDDSI level 7); Thin (IDDSI level 0); Heart healthy  Previous diet: Adult diet Regular    Patient has abnormal labs (K, Mg, hb, Cr, LFT's, Cultures resulted in last 24 hours)      Patient had Imaging resulted in 24 hours   CT Angio Chest (PE or Trauma Protocol)     Result Date: 10/09/2023   1. No CT evidence of pulmonary embolism. 2. Pleural-based mass and bilateral pulmonary nodules are increased in size 3. Interim insertion of left-sided chest tube with decreased loculated left-sided pleural effusion J. Charlie Hoe, MD 10/09/2023 2:46 PM     Antibiotics:      Anticoagulants:   Patient on coumadin  PTA    Activity Safety:      PT/OT, sitters              Yes     Concerns about Mental  Status and ADL's                                       Psychosocial:           Psychosocial:    Patient/family have questions on plan of care/ discharge plans  No concerns/questions on plan of care     Medication history updated     Dispense report 100 days   Unit Protocols:      Patient has [address]                                             Fall risk  Patient has Pressure Ulcer or at Risk                           Glucose less than 100 and on insulin                           Readmission in last 30 days        Patient transferred last night from another unit

## 2023-10-10 NOTE — Progress Notes (Signed)
 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  CM sent [facility] referral.  CM sent [facility] referral for CHF    Discussed above Discharge Plan with (patient, family, Care Team, others):  Care Team     Chart Reviewed - Case Management will continue to follow for [place] needs that may arise.        Dagoberto Cornet BSN, RN  RN Case Manager I  [employer]

## 2023-10-10 NOTE — Progress Notes (Signed)
 Environmental Manager for [employer]:     Received a referral to schedule a post-hospital follow up appointment with [name] for [employer]. Upon review with Dr. Noreen, this does not meet criteria for [facility] services.    Patient follows with PCP Carl [name].  Patient will call to schedule an appointment for follow up after discharge.       For questions, please contact [phone] or send secure Epic chat message to [facility] for [facility] [facility].     Carl [name]   Patient Access Associate III  [facility] for [facility]   (Previously-[facility])  P: [phone]  F: [phone]

## 2023-10-10 NOTE — Telephone Encounter (Signed)
 [facility] SW CASE MANAGEMENT NOTE  10/10/2023  Patient: Carl [name], Carl [name]  83 y.o., male)      SW observes that pt.'s bed order is expected to be delivered today. Social worker is available as needed.     Lorrayne Nap, LCSW  [facility] Social Work Case Production Designer, Theatre/television/film   For Head & Neck Cancers, Counsellor,   Hematology, & Endocrine  [facility]: [phone]  F: [phone]  E: [email or web address]

## 2023-10-10 NOTE — Progress Notes (Signed)
 Case Management to follow for discharge planning needs. PT ordered. Patient has a Conservator, Museum/gallery (son confirmed as noted [facility] for PleurX cath supplies) and has home care services for SN three times a week and PT ([facility]). As noted, patient with history of CHF, atrial fibrillation on Eliquis, metastatic oropharyngeal carcinoma to the lungs complicated by pleural effusion status post Pleurx placement, and COPD presented with shortness of breath was found to be in CHF exacerbation with underlying atrial fibrillation with RVR.       Case Management Initial Assessment    10/10/23 1504   Patient Type   Within 30 Days of Previous Admission? Yes   Healthcare Decisions   Interviewed: Family  (Contacted patient's room @ [phone], and son answered. Introduced self and explained the role of case management.)   Name of interviewee if other than the pt: Jonothan Rakers (Son)   English As A Second Language Teacher Information: [phone] (Mobile)   Orientation/Decision Making Abilities of Patient Alert and Oriented x3, able to make decisions   Advance Directive Patient does not have advance directive   Prior to admission   Prior level of function Ambulates with assistive device;Other (Comment);Independent with ADLs  (Uses a SP cane, but patient has increasing mobility issues. Patient also uses a rollator outside of the home. Pt needs some help with putting on his compression stockings, but it otherwise independent with ADLs.)   Type of Residence Private residence  (Demographics verified)   Administrator, Arts;Other (Comment)  (There are no steps to enter.)   Have running water , electricity, heat, etc? Yes   Living Arrangements Alone;Other (Comment)  (Son has been staying with patient since earlier this month to assist.)   How do you get to your MD appointments? Son   How do you get your groceries? Son   Who fixes your meals? Son   Who does your laundry? Son   Who picks up your prescriptions? Son, mail order   Dressing Independent    Grooming Independent   Feeding Independent   Bathing Independent   Toileting Independent   DME Currently at [facility] [place], [place];Walker, [employer] Wheel;BP Cuff;Glucometer;ADL- Grab Bars;Hospital Bed  (INR monitor, rollator, built in shower seat, new hospital bed delivered, Apple Watch has a pulse oximeter built in. Pt does not have home oxygen, but son has been working on as pt has difficulty at night (below 90s son noted on pulse ox hx))   Home Care/Community Services Skilled Nursing;Home Health  (Patient has a Conservator, Museum/gallery (patient has supplies - son confirmed as noted [facility] for PleurX cath supplies) and has SN three times a week and PT - [facility])   Name of Agency [facility]   Prior Rehab admission? (Detail) None, but has hx of OP cardiac rehab   Discharge Planning   Support Systems Children   Patient expects to be discharged to: Home with resumption of home care services   Mode of transportation: Private car (family member)   Does the patient have perscription coverage? Yes  (Insurance verified)   Physicist, Medical Strain   How hard is it for you to pay for the very basics like food, housing, medical care, and heating? Not hard   Housing Stability   In the last 12 months, was there a time when you were not able to pay the mortgage or rent on time? N   In the past 12 months, how many times have you moved where you were living? 0   At any time in the past 12 months,  were you homeless or living in a shelter (including now)? N   Transportation Needs   In the past 12 months, has lack of transportation kept you from medical appointments or from getting medications? no   In the past 12 months, has lack of transportation kept you from meetings, work, or from getting things needed for daily living? No   Consults/Providers   PT Evaluation Needed 1   OT Evalulation Needed 2   SLP Evaluation Needed 2   Correct PCP listed in Epic? Yes  (Last visit - about two/three weeks ago, and has a virtual visit tomorrow.)    Family and PCP   PCP on file was verified as the current PCP? Yes   In case you are admitted, transferred or discharged, would like family notified? Yes   Name of family member to be notified Jonothan Rakers (Son)  [phone] [facility])   In case you are admitted, transferred or discharged, would like your PCP notified? Yes     Glenys Ricker, RN, MSN, MHA  RN Case Manager 1 - Remote  Case Management Department   [facility]  [address], [place], [place] [address]  [phone] Remote Office  [email or web address] E-mail

## 2023-10-10 NOTE — Provider Clarification Note (Signed)
Patient Name: Derrin, Currey  Account #: [id number]   MR #: [id number]  Discharge Date: 09/20/2023    Dear Dr. Benard Halsted,     The final diagnosis on the pathology report states:  Lymph node, station 11L with left hilar mass: squamous cell carcinoma.  Left upper lung lobe nodule, transbronchial biopsy: Squamous cell carcinoma.        Official Coding Guidelines do not allow coding professionals to code from a pathology report; therefore, clarification is requested from attending provider.      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      Clinical Indicators:  dyspnea, hypoxic respiratory failure, pneumonia & left lung effusion     09/15/23 CT Angiogram Chest:  Extensive left pleural nodularity, pericardial thickening and an  enlarged left anterior cardiophrenic lymph node compatible with metastatic disease.       09/19/23 progress notes: Mediastinal lymphadenopathy, cytology of pleural effusion- Malignant cells present, compatible with metastatic adenocarcinoma.      Work-up:  09/19/23   EBUS TBNA - 10 biopsies - left hilum & 3 biopsies station 4L  EBBx- LUL endobronchial nodule                                     Question: Based on the above noted pathology results, is there an additional diagnosis clinically appropriate for this patient?         Pathology results clarification needed x 2:    ----- Lymph node, station 11L, squamous cell carcinoma  ----- Pathology results not clinically significant/no additional diagnosis  ----- Other (please specify)    ----- Left upper lung lobe nodule, Squamous cell carcinoma  ----- Pathology results not clinically significant/no additional diagnosis  ----- Other (please specify)       Thank you,    Tammy [email or web address]  Date:  10/02/2023        PROVIDER RESPONSE (Choose from list above or add free text): Pathology results of unclear clinical  significance/no additional diagnosis

## 2023-10-10 NOTE — Progress Notes (Signed)
 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  Patient also was found to have the flu. Otherwise patient is resting comforably on 2L o2.     Plan:       #Acute on chronic diastolic heart failure:   -Continue home thiazide-substitute HCTZ for his chlorthalidone   -Continue Lasix  40 mg IV twice daily  -daily weights, I/O  -long term telemetry  -Hold off on TTE for now    #Atrial fibrillation with RVR  -resume home metoprolol  100mg  daily  -should be safe for diltiazem  if this is needed  -Long term telemetry as above  -takes coumadin -unclear why this medication but long standing, managing as below     #Coumadin  management:  last INR at 4.0 2/1; has recentlyi been as  high as 7 and they have been holding his home doses for some days (unclear which days).  Have asked for an INR check now, then daily INRs.    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, drained every 3 days,   --Drainage today    #HTN:  -continue metoprolol  as above  -continue thiazide as above  -continue imdur  30mg  daily     #Gout:  chronic without acute flare  -continue home allopurinol  300mg  daily       #Oropharyngeal cancer:  he is to start chemotherapy and immunotherapy in the coming weeks; has not had any treatments thus far     Chemical DVT ppx  FULL  code  Telemetry needs:  long term as above  Case discussed with: patient     Safety Checklist:     DVT prophylaxis:  CHEST guideline (See page [id number]) Chemical   Foley:  [place] Rn Foley protocol Not present   IVs:  Peripheral IV   PT/OT: Ordered   Daily CBC & or Chem ordered:  SHM/ABIM guidelines (see #5) Yes, due to clinical and lab instability   Reference for approximate charges of common labs: CBC auto diff - $76  BMP - $99  Mg - $79    Lines:     Patient Lines/Drains/Airways Status       Active PICC Line / CVC Line / PIV Line / Drain / Airway / Intraosseous Line / Epidural Line / ART Line / Line / Wound / Pressure Ulcer / NG/OG Tube       Name Placement date Placement time Site Days    Peripheral IV 10/09/23 18 G Standard Right Antecubital 10/09/23  1222  Antecubital  less than 1                     Disposition: (Please see PAF column for Expected D/C Date)   Today's date: 10/10/2023  Admit Date: 10/09/2023 11:58 AM  LOS: 1  Clinical Milestones: Afiv RVR   Anticipated  discharge ne:       Subjective     CC: 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  NAD  NAEON  Rest of ROS same as HPI    Review of Systems:     As per HPI    Physical Exam:     VITAL SIGNS PHYSICAL EXAM   Temp:  [97.4 F (36.3 C)-98.2 F (36.8 C)] 97.9 F (36.6 C)  Heart Rate:  [91-118] 96  Resp Rate:  [17-22] 17  BP: (112-141)/(61-87) 125/79  Blood Glucose:        Intake/Output Summary (Last 24 hours) at 10/10/2023 9176  Last data filed at 10/10/2023 0603  Gross per 24 hour   Intake 300 ml   Output 800 ml   Net -500 ml    Physical Exam  General: awake, alert X 3  Cardiovascular: regular rate and rhythm, no murmurs, rubs or gallops  Lungs: clear to auscultation bilaterally, without wheezing, rhonchi, or rales  Abdomen: soft, non-tender, non-distended; no palpable masses,  normoactive bowel sounds  Extremities: 2+ edema         Meds:     Medications were reviewed:  Current Facility-Administered Medications   Medication Dose Route Frequency     allopurinol   300 mg Oral Daily    atorvastatin   40 mg Oral Daily    budesonide -formoterol   2 puff Inhalation BID    furosemide   40 mg Intravenous BID    hydroCHLOROthiazide   25 mg Oral Daily    isosorbide  mononitrate  30 mg Oral Daily    metoprolol  succinate  100 mg Oral Daily    oseltamivir   75 mg Oral Q12H     Infusion Meds[1]  PRN Medications[2]      Labs:     Labs (last 72 hours):    Recent Labs   Lab 10/10/23  0335 10/09/23  1225   WBC 16.04* 16.71*   Hemoglobin 12.9 12.8   Hematocrit 38.4 38.9   Platelet Count 234 271       Recent Labs   Lab 10/10/23  0335 10/09/23  1629   PT 77.7* 75.3*   INR 7.0* 6.8*    Recent Labs   Lab 10/10/23  0335 10/09/23  1225   Sodium 138 140   Potassium 4.2 3.5   Chloride 98* 97*   CO2 29 34*   BUN 26 25   Creatinine 0.7 0.8   Calcium  10.6* 10.9*   Albumin   --  2.0*   Protein, Total  --  6.0   Bilirubin, Total  --  0.5   Alkaline Phosphatase  --  158*   ALT  --  27   AST (SGOT)  --  36   Glucose 135* 136*                   Microbiology, reviewed and are significant for:  Microbiology Results (last 15 days)       Procedure Component Value Units Date/Time    COVID-19 and Influenza ([place]) (symptomatic) [[id number]]  (Abnormal) Collected: 10/09/23 1228    Order Status: Completed Specimen: Swab from Anterior Nares Updated: 10/09/23 1327     SARS-CoV-2 (COVID-19) RNA Not Detected     Influenza A RNA Detected     Influenza B RNA Not Detected    Narrative:      A result of Detected indicates POSITIVE for the presence of viral RNA  A result of Not Detected indicates NEGATIVE for the presence of viral RNA  Test performed using the Roche cobas [place] SARS-CoV-2 & Influenza A/B assay. This is a multiplex real-time RT-PCR assay for the detection of SARS-CoV-2, influenza A, and influenza B virus RNA. Viral nucleic acids may persist in vivo, independent of viability. Detection of viral nucleic acid does not imply the presence of infectious virus, or that virus nucleic acid is the cause of  clinical symptoms. Negative results do not preclude SARS-CoV-2, influenza A, and/or influenza B infection and should not be used as the sole basis for diagnosis, treatment or other patient management decisions. Invalid results may be due to inhibiting substances in the specimen and recollection should occur.             Imaging, reviewed and are significant for:  CT Angio Chest (PE or Trauma Protocol)    Result Date: 10/09/2023   1. No CT evidence of pulmonary embolism. 2. Pleural-based mass and bilateral pulmonary nodules are increased in size 3. Interim insertion of left-sided chest tube with decreased loculated left-sided pleural effusion J. Charlie Hoe, MD 10/09/2023 2:46 PM    XR Chest AP Portable    Result Date: 10/05/2023   Slight decrease in left pleural effusion since prior study Jeana Sor, MD 10/05/2023 3:01 PM         Signed by: Damian LELON Rim, MD               [1]   Current Facility-Administered Medications   Medication Dose Route Frequency Last Rate   [2]   Current Facility-Administered Medications   Medication Dose Route    acetaminophen   650 mg Oral    benzocaine -menthol   1 lozenge Buccal    benzonatate   100 mg Oral    carboxymethylcellulose sodium  1 drop Both Eyes    dextrose   15 g of glucose Oral    Or    dextrose   12.5 g Intravenous    Or    dextrose   12.5 g Intravenous    Or    glucagon  (rDNA)  1 mg Intramuscular    magnesium  sulfate  1 g Intravenous    melatonin  3 mg Oral    naloxone   0.2 mg Intravenous    potassium & sodium phosphates   2 packet Oral    potassium chloride   0-60 mEq Oral    Or    potassium chloride   0-60 mEq Oral    Or    potassium chloride   10 mEq Intravenous    saline  2 spray Each Nare

## 2023-10-10 NOTE — Telephone Encounter (Signed)
Lvm to reschedule video visit on 2/5 - pt is currently admitted in hospital

## 2023-10-11 ENCOUNTER — Inpatient Hospital Stay: Payer: Medicare Other

## 2023-10-11 ENCOUNTER — Encounter (INDEPENDENT_AMBULATORY_CARE_PROVIDER_SITE_OTHER): Payer: Self-pay | Admitting: Internal Medicine

## 2023-10-11 ENCOUNTER — Telehealth (INDEPENDENT_AMBULATORY_CARE_PROVIDER_SITE_OTHER): Payer: Medicare Other | Admitting: Internal Medicine

## 2023-10-11 DIAGNOSIS — E1159 Type 2 diabetes mellitus with other circulatory complications: Secondary | ICD-10-CM

## 2023-10-11 DIAGNOSIS — I5033 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: Secondary | ICD-10-CM

## 2023-10-11 DIAGNOSIS — R0902 Hypoxemia: Secondary | ICD-10-CM

## 2023-10-11 DIAGNOSIS — M160 Bilateral primary osteoarthritis of hip: Secondary | ICD-10-CM

## 2023-10-11 DIAGNOSIS — L2084 Intrinsic (allergic) eczema: Secondary | ICD-10-CM

## 2023-10-11 DIAGNOSIS — R053 Chronic cough: Secondary | ICD-10-CM

## 2023-10-11 DIAGNOSIS — E1169 Type 2 diabetes mellitus with other specified complication: Secondary | ICD-10-CM

## 2023-10-11 DIAGNOSIS — E785 Hyperlipidemia, unspecified: Secondary | ICD-10-CM

## 2023-10-11 DIAGNOSIS — I5022 Chronic systolic (congestive) heart failure: Secondary | ICD-10-CM

## 2023-10-11 DIAGNOSIS — M170 Bilateral primary osteoarthritis of knee: Secondary | ICD-10-CM

## 2023-10-11 DIAGNOSIS — I4892 Unspecified atrial flutter: Secondary | ICD-10-CM

## 2023-10-11 DIAGNOSIS — I2510 Atherosclerotic heart disease of native coronary artery without angina pectoris: Secondary | ICD-10-CM

## 2023-10-11 DIAGNOSIS — I152 Hypertension secondary to endocrine disorders: Secondary | ICD-10-CM

## 2023-10-11 LAB — BASIC METABOLIC PANEL
Anion Gap: 10 (ref 5.0–15.0)
BUN: 26 mg/dL (ref 9–28)
CO2: 33 meq/L — ABNORMAL HIGH (ref 17–29)
Calcium: 10.7 mg/dL — ABNORMAL HIGH (ref 7.9–10.2)
Chloride: 94 meq/L — ABNORMAL LOW (ref 99–111)
Creatinine: 0.8 mg/dL (ref 0.5–1.5)
GFR: >60 mL/min/{1.73_m2} (ref >=60.0)
Glucose: 258 mg/dL — ABNORMAL HIGH (ref 70–100)
Potassium: 2.9 meq/L — ABNORMAL LOW (ref 3.5–5.3)
Sodium: 137 meq/L (ref 135–145)

## 2023-10-11 LAB — PT/INR
INR: 6.1 (ref 0.9–1.1)
PT: 68.1 s — ABNORMAL HIGH (ref 10.1–12.9)

## 2023-10-11 MED ORDER — ALBUTEROL-IPRATROPIUM 2.5-0.5 (3) MG/3ML IN SOLN
3.0000 mL | Freq: Once | RESPIRATORY_TRACT | Status: DC
Start: 2023-10-11 — End: 2023-10-17
  Filled 2023-10-11 (×3): qty 3

## 2023-10-11 MED ORDER — ALBUTEROL-IPRATROPIUM 2.5-0.5 (3) MG/3ML IN SOLN
3.0000 mL | Freq: Four times a day (QID) | RESPIRATORY_TRACT | Status: DC
Start: 2023-10-11 — End: 2023-10-17
  Administered 2023-10-11 – 2023-10-17 (×24): 3 mL via RESPIRATORY_TRACT
  Filled 2023-10-11 (×19): qty 3

## 2023-10-11 MED ORDER — DILTIAZEM HCL 30 MG PO TABS
30.0000 mg | ORAL_TABLET | Freq: Four times a day (QID) | ORAL | Status: DC
Start: 2023-10-11 — End: 2023-10-12
  Administered 2023-10-11 – 2023-10-12 (×3): 30 mg via ORAL
  Filled 2023-10-11 (×8): qty 1

## 2023-10-11 NOTE — Telephone Encounter (Signed)
2nd attempt to call, lvm to reschedule video visit on 2/5 - pt is currently admitted in hospital

## 2023-10-11 NOTE — Plan of Care (Signed)
 Problem: Moderate/High Fall Risk Score >5  Goal: Patient will remain free of falls  Outcome: Progressing  Flowsheets (Taken 10/11/2023 1700)  Moderate Risk (6-13):   MOD-Consider activation of bed alarm if appropriate   MOD-Floor mat at bedside (where available) if appropriate   MOD-Remain with patient during toileting   MOD-Perform dangle, stand, walk (DSW) prior to mobilization   MOD-Request PT/OT consult order for patients with gait/mobility impairment   MOD-Use gait belt when appropriate   MOD-include family in multidisciplinary POC discussions     Problem: Compromised Moisture  Goal: Moisture level Interventions  Outcome: Progressing  Flowsheets (Taken 10/11/2023 0845)  Moisture level Interventions: Moisture wicking products, Moisture barrier cream     Problem: Compromised Activity/Mobility  Goal: Activity/Mobility Interventions  Outcome: Progressing  Flowsheets (Taken 10/11/2023 0845)  Activity/Mobility Interventions: Pad bony prominences, TAP Seated positioning system when OOB, Promote PMP, Reposition q 2 hrs / turn clock, Offload heels     Problem: Everyday - Heart Failure  Goal: Stable Vital Signs and Fluid Balance  Outcome: Progressing  Flowsheets (Taken 10/11/2023 0141 by Marianne Clare MATSU, RN)  Stable Vital Signs and Fluid Balance:   Daily Standing Weights in the morning using the same scale, after using the bathroom and before breadfast.  If unable to stand, zero the bed and use the bed scale   Monitor labs and report abnormalities to physician   Fluid Restriction   Strict Intake/Output   Monitor, assess vital signs and telemetry per policy   Assess for swelling/edema   Wean oxygen as needed if appropriate  Goal: Mobility/Activity is Maintained at Optimal Level for Patient  Outcome: Progressing  Flowsheets (Taken 10/11/2023 0141 by Okafor, Ngozi G, RN)  Mobility/Activity is Maintained at Optimal Level for Patient:   Increase mobility as tolerated/progressive mobility protocol   Reposition patient every 2 hours  and as needed unless able to reposition self   Assess for changes in respiratory status, level of consciousness and/or development of fatigue  Goal: Nutritional Intake is Adequate  Outcome: Progressing  Flowsheets (Taken 10/11/2023 0141 by Marianne Clare MATSU, RN)  Nutritional Intake is Adequate:   Cardiac diet-2 gm Sodium   Fluid Restricction if needed   Patient and family teaching on low sodium diet   Encourage/perform oral hygiene as appropriate     Problem: Inadequate Gas Exchange  Goal: Adequate oxygenation and improved ventilation  Outcome: Progressing  Flowsheets (Taken 10/11/2023 0141 by Marianne Clare MATSU, RN)  Adequate oxygenation and improved ventilation:   Assess lung sounds   Monitor SpO2 and treat as needed   Position for maximum ventilatory efficiency   Increase activity as tolerated/progressive mobility  Goal: Patent Airway maintained  Outcome: Progressing  Flowsheets (Taken 10/10/2023 0012 by Warnell Binet, RN)  Patent airway maintained:   Position patient for maximum ventilatory efficiency   Reinforce use of ordered respiratory interventions (i.e. CPAP, BiPAP, Incentive Spirometer, Acapella, etc.)   Provide adequate fluid intake to liquefy secretions   Reposition patient every 2 hours and as needed unless able to self-reposition

## 2023-10-11 NOTE — Consults (Signed)
 CONSULT NOTE  Interventional Pulmonology  Team Spectra : [id number]  Epic Chat: [facility] Interventional Pulmonary Service      Date Time: 10/11/23 3:58 PM  Patient Name: Carl [name]  Attending Physician: Venora Damian ORN, MD  Consulting Physician:  Arleta Blanch, MD  Reason for consultation: Low output from pleurx      Assessment:   83 y.o. male with PMHx of CHF, AFib on eliquis, COPD, and metastatic oropharyngeal ca with malignant L pleural effusion sp pleurx who p/w SOB found to have CHD exacerbation and Flu. IP consulted as patient has been having low output from pleurx over 2 weeks.    CT chest from 2/3 reviewed. There is evidence of small loculated effusion in the fissure with increased tumor burden.       Plan:   -Discussed with the patient and son at bedside that pleurx drainage is minimal given no significant fluid collection to drain. Would recommend decreasing pleurx drainage to once a week (due next Wednesday as there was an attempt at drainage this AM)  -He has a follow up with Dr. Maree on 2/27 to discuss possible pleurx removal if drainage remains low    There are no further IP plans. Thank you for allowing us  to be a part of your patient's care. Please call with questions.      Patient seen and D/W Dr. Blanch Carl Redbird, PA-C  Interventional Pulmonology/Thoracic Surgery Physician Assistant  Interventional Pulmonology team spectra  [phone]        History of Present Illness:   Carl [name] is a 83 y.o. male with PMHx of CHF, AFib on eliquis, COPD, and metastatic oropharyngeal ca with malignant L pleural effusion sp pleurx who p/w SOB found to have CHD exacerbation and Flu. IP consulted as patient has been having low output from pleurx over 2 weeks.    Carl [name] reports less than 50 cc of drainage each time over the past week. He has been responding well to diuresis and states that previously, neb treatments have been the most effective in managing his orthopnea which is his most prominent  symptoms.       Past Medical History:     Past Medical History:   Diagnosis Date    Diabetes mellitus     Hyperlipidemia     Hypertension        Patient has BMI=Body mass index is 28.29 kg/m.  Diagnosis: No additional diagnosis based on BMI criteria          Recent Labs     10/11/23  0312 10/10/23  0335 10/09/23  1225   Potassium 2.9* 4.2 3.5     Diagnosis: Hypokalemia           Past Surgical History:   Past Surgical History[1]    Family History:   Family History[2]    Social History:     Social History     Socioeconomic History    Marital status: Widowed     Spouse name: Not on file    Number of children: Not on file    Years of education: Not on file    Highest education level: Not on file   Occupational History    Not on file   Tobacco Use    Smoking status: Former     Types: Cigarettes     Passive exposure: Past    Smokeless tobacco: Not on file    Tobacco comments:     Quit smoking  June 1989, smoked 30 years before   Vaping Use    Vaping status: Never Used   Substance and Sexual Activity    Alcohol use: Not Currently    Drug use: Not Currently    Sexual activity: Not Currently   Other Topics Concern    Not on file   Social History Narrative    Not on file     Social Drivers of Health     Financial Resource Strain: Low Risk  (10/10/2023)    Overall Financial Resource Strain (CARDIA)     Difficulty of Paying Living Expenses: Not hard at all   Food Insecurity: No Food Insecurity (10/09/2023)    Hunger Vital Sign     Worried About Running Out of Food in the Last Year: Never true     Ran Out of Food in the Last Year: Never true   Transportation Needs: No Transportation Needs (10/10/2023)    PRAPARE - Therapist, Art (Medical): No     Lack of Transportation (Non-Medical): No   Physical Activity: Inactive (10/08/2023)    Exercise Vital Sign     Days of Exercise per Week: 0 days     Minutes of Exercise per Session: 20 min   Stress: No Stress Concern Present (10/08/2023)    [employer] of  Occupational Health - Occupational Stress Questionnaire     Feeling of Stress : Only a little   Social Connections: Moderately Isolated (10/08/2023)    Social Connection and Isolation Panel [NHANES]     Frequency of Communication with Friends and Family: More than three times a week     Frequency of Social Gatherings with Friends and Family: More than three times a week     Attends Religious Services: More than 4 times per year     Active Member of [employer] or Organizations: No     Attends Banker Meetings: Not on file     Marital Status: Widowed   Intimate Partner Violence: Not At Risk (10/09/2023)    Humiliation, Afraid, Rape, and Kick questionnaire     Fear of Current or Ex-Partner: No     Emotionally Abused: No     Physically Abused: No     Sexually Abused: No   Housing Stability: Low Risk  (10/10/2023)    Housing Stability Vital Sign     Unable to Pay for Housing in the Last Year: No     Number of Times Moved in the Last Year: 0     Homeless in the Last Year: No       Allergies:   Allergies[3]    Medications:     Prior to Admission medications    Medication Sig Start Date End Date Taking? Authorizing Provider   albuterol  sulfate HFA (PROVENTIL ) 108 (90 Base) MCG/ACT inhaler Inhale 2 puffs into the lungs every 4 (four) hours as needed for Shortness of Breath   Yes [provider]   allopurinol  (ZYLOPRIM ) 300 MG tablet Take 1 tablet (300 mg) by mouth daily 01/18/23  Yes Alsamman, Mhd Mounaf, MD   atorvastatin  (LIPITOR) 40 MG tablet Take 1 tablet (40 mg) by mouth daily 08/09/23  Yes Roz Lamar BRAVO, MD   chlorthalidone  25 MG tablet Take 0.5 tablets (12.5 mg) by mouth daily 08/10/23  Yes Roz Lamar BRAVO, MD   dupilumab  (DUPIXENT ) 300 MG/2ML injection Inject 300 mg into the skin every 14 (fourteen) days On tues   Yes [provider]  furosemide  (LASIX ) 20 MG tablet Take 1 tablet (20 mg) by mouth daily 08/09/23  Yes Roz Lamar BRAVO, MD   glipiZIDE  (GLUCOTROL ) 10 MG 24 hr tablet Take 1 tablet  (10 mg) by mouth daily 09/27/23  Yes Lon Ip, MD   GLUCOSAMINE-CHONDROITIN PO Take 2 tablets by mouth every morning (Glucosamine HCI 1500 mg and Chondroitin Sulfate 1200 mg)   Yes [provider]   isosorbide  mononitrate (IMDUR ) 30 MG 24 hr tablet Take 1 tablet (30 mg) by mouth daily 08/09/23  Yes Roz Lamar BRAVO, MD   metFORMIN  (GLUCOPHAGE -XR) 500 MG 24 hr tablet Take 2 tablets (1,000 mg) by mouth 2 (two) times daily 08/10/23 11/08/23 Yes Cho, Hyegin H, MD   metoprolol  succinate (TOPROL -XL) 100 MG 24 hr tablet Take 1 tablet (100 mg) by mouth daily 01/18/23  Yes Alsamman, Mhd Mounaf, MD   Mometasone  Furo-Formoterol  Fum (Dulera) 200-5 MCG/ACT Aerosol Inhale 2 puffs into the lungs 2 (two) times daily   Yes [provider]   OneTouch Ultra test strip Use As Directed 09/27/23  Yes Lon Ip, MD   Vitamin D, Cholecalciferol, 50 MCG (2000 UT) Cap Take 2,000 Units by mouth every morning   Yes [provider]   warfarin (COUMADIN ) 5 MG tablet Take 1.5 tablets (7.5 mg) by mouth every evening  Patient taking differently: Take 0.5 tablets (2.5 mg) by mouth every evening INR 2-3 goal 07/21/23  Yes Roz Lamar BRAVO, MD   clobetasol (TEMOVATE) 0.05 % cream Apply topically daily as needed (for eczema) 05/23/23   [provider]       Review of Systems:   A comprehensive review of systems was: negative except as stated in HPI    Physical Exam:     Vitals:    10/11/23 1207   BP: 115/75   Pulse: (!) 103   Resp: 20   Temp: 97.6 F (36.4 C)   SpO2:        GEN:  NAD  HEENT: Anicteric conjunctiva, pupils reactive  NECK: Supple, good range of motion   PULM: Good respiratory effort on 2L per NC. L Pleurx site c//di  CV: IRRR  ABD: Soft, non-tender, non-distended  EXT: Warm, + edema BL  NEURO: alert, oriented, able to answer questions appropriately  PSYCH: Appropriate mood and affect  SKIN: No diffuse rashes        Labs:     Lab Results   Component Value Date    WBC 16.04 (H) 10/10/2023    HGB 12.9  10/10/2023    HCT 38.4 10/10/2023    MCV 90.6 10/10/2023    PLT 234 10/10/2023     Lab Results   Component Value Date    NA 137 10/11/2023    K 2.9 (L) 10/11/2023    CL 94 (L) 10/11/2023    CO2 33 (H) 10/11/2023    BUN 26 10/11/2023    CREAT 0.8 10/11/2023    GLU 258 (H) 10/11/2023    CA 10.7 (H) 10/11/2023    MG 1.5 (L) 10/10/2023    PHOS 2.9 09/12/2023     Lab Results   Component Value Date    PT 68.1 (H) 10/11/2023    PTT 33 09/15/2023    INR 6.1 (HH) 10/11/2023       Rads:   XR Chest AP Portable    Result Date: 10/11/2023  1. Interval increase in left perihilar opacity, left-sided pleural effusion and associated left basilar atelectasis and/or pneumonia. 2. There has  been no significant change in the appearance of the right lung. 3. Reported bilateral pulmonary nodules on the CT scan are less well delineated on this study. Please refer to the CT scan for further delineation. Lindie Cunning, MD 10/11/2023 7:15 AM    CT Angio Chest (PE or Trauma Protocol)    Result Date: 10/09/2023   1. No CT evidence of pulmonary embolism. 2. Pleural-based mass and bilateral pulmonary nodules are increased in size 3. Interim insertion of left-sided chest tube with decreased loculated left-sided pleural effusion J. Charlie Hoe, MD 10/09/2023 2:46 PM    XR Chest AP Portable    Result Date: 10/05/2023   Slight decrease in left pleural effusion since prior study Jeana Sor, MD 10/05/2023 3:01 PM    XR Chest AP Portable    Result Date: 09/19/2023  1. New left pleural catheter. No pneumothorax identified. 2. Mild vascular congestion with left pleural effusion and basilar airspace opacity, not significantly changed. Levada Cinnamon, MD 09/19/2023 4:20 PM    CT Abdomen Pelvis W IV/ WO PO Cont    Result Date: 09/17/2023  1. Extensive metastatic malignancy in the chest, described in full on earlier chest CT. 2. No potential primary tumor detectable in the abdomen or pelvis. 3. Small left adrenal gland nodule represents potential metastasis. 4.  Moderate stool distention colon and rectum. 5. Remainder as above. Wolm Luis, MD 09/17/2023 11:54 AM    MRI Brain W WO Contrast    Result Date: 09/17/2023   1.No evidence of intracranial metastases. Alex Jude Crawford Ruth, MD 09/17/2023 10:20 AM    CT Angiogram Chest    Result Date: 09/15/2023  1. Negative for pulmonary embolism. 2. Extensive left pleural nodularity, pericardial thickening and an enlarged left anterior cardiophrenic lymph node compatible with metastatic disease. 3. Persistent left pleural effusion, slightly decreased. Persistent almost complete left lower lobe atelectasis. 4. Indeterminate left adrenal nodule. [place] Rowan, MD 09/15/2023 6:27 PM    XR Chest AP Portable    Result Date: 09/15/2023  Interval progression of a left-sided pleural effusion with associated atelectasis and/or pneumonia. Lindie Cunning, MD 09/15/2023 5:51 AM    XR Chest AP Portable    Result Date: 09/14/2023  Unchanged small to moderate left pleural effusion. The[place]olis Rowan, MD 09/14/2023 4:21 PM    XR Chest AP Portable    Result Date: 09/12/2023  1.Slight improvement of moderate to large left pleural effusion/atelectasis. Superimposed pneumonia is less likely, but cannot be excluded. 2.Stable retrocardiac atelectasis. Lillette CHRISTELLA Gillis, MD 09/12/2023 12:42 PM                      [1]   Past Surgical History:  Procedure Laterality Date    CARDIAC SURGERY  2004    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    PLACEMENT, INDWELLING CATHETER PLEURAL Left 09/19/2023    Procedure: PLACEMENT, INDWELLING TUNNELED CATHETER, PLEURAL;  Surgeon: Maree Jinnie RAMAN, MD;  Location: [id number];  Service: Pulmonary;  Laterality: Left;  UNIVERSAL    SKIN BIOPSY      TONSILLECTOMY      ULTRASOUND, ENDOBRONCHIAL (EBUS) N/A 09/19/2023    Procedure: ULTRASOUND, ENDOBRONCHIAL (EBUS);  Surgeon: Maree Jinnie RAMAN, MD;  Location: [facility];  Service: Pulmonary;  Laterality: N/A;   [2]   Family History  Problem Relation Name Age of Onset    Lung cancer Brother     [3]    Allergies  Allergen Reactions    Cephalexin Rash

## 2023-10-11 NOTE — Plan of Care (Signed)
 Pt alert and oriented and able to make needs known. Denies pain at this time. Pt on 1-2 liters of 02 with sats in mid 80s to mid 90s.  VSS. Pt c/o intermittent difficulty breathing. Pleurex cath drained with minimal amount of output. Pt continues to c/o difficulty breathing. Order received for albuterol  x1. Given x  1. Remains with 2-3+ pitting edema to BLE. Pt sitting upright in bed with some relief. All belongings within reach of pt. Care plans reviewed with pt. Verbalized understanding. NAD. Will continue to monitor with hourly rounding and treat as needed.    Problem: Moderate/High Fall Risk Score >5  Goal: Patient will remain free of falls  Outcome: Progressing     Problem: Compromised Moisture  Goal: Moisture level Interventions  Outcome: Progressing  Flowsheets (Taken 10/11/2023 0141)  Moisture level Interventions: Moisture wicking products, Moisture barrier cream     Problem: Compromised Activity/Mobility  Goal: Activity/Mobility Interventions  Outcome: Progressing     Problem: Day of Admission - Heart Failure  Goal: Heart Failure Admission  Outcome: Progressing     Problem: Everyday - Heart Failure  Goal: Stable Vital Signs and Fluid Balance  Outcome: Progressing  Flowsheets (Taken 10/11/2023 0141)  Stable Vital Signs and Fluid Balance:   Daily Standing Weights in the morning using the same scale, after using the bathroom and before breadfast.  If unable to stand, zero the bed and use the bed scale   Monitor labs and report abnormalities to physician   Fluid Restriction   Strict Intake/Output   Monitor, assess vital signs and telemetry per policy   Assess for swelling/edema   Wean oxygen as needed if appropriate  Goal: Mobility/Activity is Maintained at Optimal Level for Patient  Outcome: Progressing  Flowsheets (Taken 10/11/2023 0141)  Mobility/Activity is Maintained at Optimal Level for Patient:   Increase mobility as tolerated/progressive mobility protocol   Reposition patient every 2 hours and as needed  unless able to reposition self   Assess for changes in respiratory status, level of consciousness and/or development of fatigue  Goal: Nutritional Intake is Adequate  Outcome: Progressing  Flowsheets (Taken 10/11/2023 0141)  Nutritional Intake is Adequate:   Cardiac diet-2 gm Sodium   Fluid Restricction if needed   Patient and family teaching on low sodium diet   Encourage/perform oral hygiene as appropriate  Goal: Teaching-Using CHF Warning Zones and Educational Videos  Outcome: Progressing  Flowsheets (Taken 10/11/2023 0141)  Teaching-Using CHF Warning Zones and Educational Videos:   Signs & Symptoms of CHF   Daily Standing Weights & record   Sodium Restriction   Fluid Restriction if appropriate     Problem: Day of Discharge - Heart Failure  Goal: Discharge Education  Outcome: Progressing     Problem: Inadequate Gas Exchange  Goal: Adequate oxygenation and improved ventilation  Outcome: Progressing  Flowsheets (Taken 10/11/2023 0141)  Adequate oxygenation and improved ventilation:   Assess lung sounds   Monitor SpO2 and treat as needed   Position for maximum ventilatory efficiency   Increase activity as tolerated/progressive mobility  Goal: Patent Airway maintained  Outcome: Progressing

## 2023-10-11 NOTE — Nursing Progress Note (Signed)
 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  Patient also was found to have the flu     Last 24 Hrs (any critical events): Pt c/o increasing SOB, Pleurex cath drained with minimal output. CXR done and Duonebs given as ordered.    LDA (drains, central lines, foley, & the indication): Pleurex cath in place    Discharge barriers: Pending medical clearance    Abnormal labs/VS (needing any orders): Potassium 2.9 and INR 6.1, MD aware    Respiratory (changes in oxygenation): 1-2 Liters of 02. Nebs  given for difficulty breathing. STAT CXR done    GI/GU (changes in output, BM, foley): Voiding freely    Neuro changes: AxOx4    Nutrition: Heart Healthy    Activity/ Safety/ Mobility questions (need for PT/OT, sitter): Ambulates with SBA.    Tele/ Rhythm (changes?): AFIB with PVCs    Skin/ Wounds:     VTE prophylaxis:  Coumadin  on hold due to sub therapeutic INR    Upcoming Procedures: None    Case management needs: TBD    Discharge plan: TBD

## 2023-10-11 NOTE — Progress Notes (Signed)
 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  Patient also was found to have the flu. Otherwise patient is resting comforably on 2L o2.     Plan:       #Acute on chronic diastolic heart failure:   -Continue home thiazide-substitute HCTZ for his chlorthalidone   -Continue Lasix  40 mg IV twice daily  -daily weights, I/O  -long term telemetry  -Hold off on TTE for now    #SOB  --remains short of breath  --Cont continue to diurese as above  -- Continue to wean oxygen as tolerated  -- Discussed with IR for troubleshoot Pleurx as has not been drained for 4 days  -- Messaged oncology given findings on CT scan that shows mass in the lung.    #Atrial fibrillation with RVR  -resume home metoprolol  100mg  daily  -should be safe for diltiazem  if this is needed  -Long term telemetry as above  -takes coumadin -unclear why this medication but long standing, managing as below     #Coumadin  management:  last INR at 4.0 2/1; has recentlyi been as  high as 7 and they have been holding his home doses for some days (unclear which days).  Have asked for an INR check now, then daily INRs.    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, drained every 3 days,   --Drainage today    #HTN:  -continue metoprolol  as above  -continue thiazide as  above  -continue imdur  30mg  daily     #Gout:  chronic without acute flare  -continue home allopurinol  300mg  daily       #Oropharyngeal cancer:  he is to start chemotherapy and immunotherapy in the coming weeks; has not had any treatments thus far     Chemical DVT ppx  FULL code  Telemetry needs:  long term as above  Case discussed with: patient     Safety Checklist:     DVT prophylaxis:  CHEST guideline (See page [id number]) Chemical   Foley:  [place] Rn Foley protocol Not present   IVs:  Peripheral IV   PT/OT: Ordered   Daily CBC & or Chem ordered:  SHM/ABIM guidelines (see #5) Yes, due to clinical and lab instability   Reference for approximate charges of common labs: CBC auto diff - $76  BMP - $99  Mg - $79    Lines:     Patient Lines/Drains/Airways Status       Active PICC Line / CVC Line / PIV Line / Drain / Airway / Intraosseous Line / Epidural Line / ART Line / Line / Wound / Pressure Ulcer / NG/OG Tube       Name Placement date Placement time Site Days    Peripheral IV 10/09/23 18 G Standard Right Antecubital 10/09/23  1222  Antecubital  less than 1                     Disposition: (Please see PAF column for Expected D/C Date)   Today's date: 10/11/2023  Admit Date: 10/09/2023 11:58 AM  LOS: 2  Clinical Milestones: Afiv RVR   Anticipated discharge ne:       Subjective     CC: 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  NAD  NAEON  Rest of ROS same as HPI    Review of Systems:     As per HPI    Physical Exam:     VITAL SIGNS PHYSICAL EXAM   Temp:  [97.4 F (36.3 C)-98.2 F (36.8 C)] 98 F (36.7 C)  Heart Rate:  [94-113] 96  Resp Rate:  [17-20] 17  BP: (125-134)/(75-79) 131/75  Blood Glucose:        Intake/Output Summary (Last 24 hours) at 10/11/2023 0819  Last data filed at 10/11/2023 0400  Gross per 24 hour   Intake 220 ml   Output 855 ml   Net -635 ml    Physical Exam  General: awake, alert X 3  Cardiovascular: regular rate and rhythm, no murmurs, rubs or gallops  Lungs: clear to auscultation  bilaterally, without wheezing, rhonchi, or rales  Abdomen: soft, non-tender, non-distended; no palpable masses,  normoactive bowel sounds  Extremities: 2+ edema         Meds:     Medications were reviewed:  Current Facility-Administered Medications   Medication Dose Route Frequency    albuterol -ipratropium  3 mL Nebulization Once    allopurinol   300 mg Oral Daily    atorvastatin   40 mg Oral Daily    budesonide -formoterol   2 puff Inhalation BID    furosemide   40 mg Intravenous BID    hydroCHLOROthiazide   25 mg Oral Daily    isosorbide  mononitrate  30 mg Oral Daily    metoprolol  succinate  100 mg Oral Daily    oseltamivir   75 mg Oral Q12H     Infusion Meds[1]  PRN Medications[2]      Labs:     Labs (last 72 hours):    Recent Labs   Lab 10/10/23  0335 10/09/23  1225   WBC 16.04* 16.71*   Hemoglobin 12.9 12.8   Hematocrit 38.4 38.9   Platelet Count 234 271       Recent Labs   Lab 10/11/23  0312 10/10/23  0335   PT 68.1* 77.7*   INR 6.1* 7.0*    Recent Labs   Lab 10/11/23  0312 10/10/23  0335 10/09/23  1225   Sodium 137 138 140   Potassium 2.9* 4.2 3.5   Chloride 94* 98* 97*   CO2 33* 29 34*   BUN 26 26 25    Creatinine 0.8 0.7 0.8   Calcium  10.7* 10.6* 10.9*   Albumin   --   --  2.0*   Protein, Total  --   --  6.0   Bilirubin, Total  --   --  0.5   Alkaline Phosphatase  --   --  158*   ALT  --   --  27   AST (SGOT)  --   --  36   Glucose 258* 135* 136*                   Microbiology, reviewed and are significant for:  Microbiology Results (last 15 days)       Procedure Component Value  Units Date/Time    COVID-19 and Influenza ([place]) (symptomatic) [[id number]]  (Abnormal) Collected: 10/09/23 1228    Order Status: Completed Specimen: Swab from Anterior Nares Updated: 10/09/23 1327     SARS-CoV-2 (COVID-19) RNA Not Detected     Influenza A RNA Detected     Influenza B RNA Not Detected    Narrative:      A result of Detected indicates POSITIVE for the presence of viral RNA  A result of Not Detected indicates NEGATIVE for  the presence of viral RNA    Test performed using the Roche cobas [place] SARS-CoV-2 & Influenza A/B assay. This is a multiplex real-time RT-PCR assay for the detection of SARS-CoV-2, influenza A, and influenza B virus RNA. Viral nucleic acids may persist in vivo, independent of viability. Detection of viral nucleic acid does not imply the presence of infectious virus, or that virus nucleic acid is the cause of clinical symptoms. Negative results do not preclude SARS-CoV-2, influenza A, and/or influenza B infection and should not be used as the sole basis for diagnosis, treatment or other patient management decisions. Invalid results may be due to inhibiting substances in the specimen and recollection should occur.             Imaging, reviewed and are significant for:  XR Chest AP Portable    Result Date: 10/11/2023  1. Interval increase in left perihilar opacity, left-sided pleural effusion and associated left basilar atelectasis and/or pneumonia. 2. There has been no significant change in the appearance of the right lung. 3. Reported bilateral pulmonary nodules on the CT scan are less well delineated on this study. Please refer to the CT scan for further delineation. Lindie Cunning, MD 10/11/2023 7:15 AM    CT Angio Chest (PE or Trauma Protocol)    Result Date: 10/09/2023   1. No CT evidence of pulmonary embolism. 2. Pleural-based mass and bilateral pulmonary nodules are increased in size 3. Interim insertion of left-sided chest tube with decreased loculated left-sided pleural effusion J. Charlie Hoe, MD 10/09/2023 2:46 PM    XR Chest AP Portable    Result Date: 10/05/2023   Slight decrease in left pleural effusion since prior study Jeana Sor, MD 10/05/2023 3:01 PM         Signed by: Damian LELON Rim, MD             [1]   Current Facility-Administered Medications   Medication Dose Route Frequency Last Rate   [2]   Current Facility-Administered Medications   Medication Dose Route    acetaminophen   650 mg Oral     benzocaine -menthol   1 lozenge Buccal    benzonatate   100 mg Oral    carboxymethylcellulose sodium  1 drop Both Eyes    dextrose   15 g of glucose Oral    Or    dextrose   12.5 g Intravenous    Or    dextrose   12.5 g Intravenous    Or    glucagon  (rDNA)  1 mg Intramuscular    magnesium  sulfate  1 g Intravenous    melatonin  3 mg Oral    naloxone   0.2 mg Intravenous    potassium & sodium phosphates   2 packet Oral    potassium chloride   0-60 mEq Oral    Or    potassium chloride   0-60 mEq Oral    Or    potassium chloride   10 mEq Intravenous    saline  2 spray Each Nare

## 2023-10-12 ENCOUNTER — Ambulatory Visit: Payer: Medicare Other

## 2023-10-12 LAB — BASIC METABOLIC PANEL
Anion Gap: 8 (ref 5.0–15.0)
BUN: 25 mg/dL (ref 9–28)
CO2: 36 meq/L — ABNORMAL HIGH (ref 17–29)
Calcium: 10.6 mg/dL — ABNORMAL HIGH (ref 7.9–10.2)
Chloride: 94 meq/L — ABNORMAL LOW (ref 99–111)
Creatinine: 0.8 mg/dL (ref 0.5–1.5)
GFR: >60 mL/min/{1.73_m2} (ref >=60.0)
Glucose: 231 mg/dL — ABNORMAL HIGH (ref 70–100)
Potassium: 3.2 meq/L — ABNORMAL LOW (ref 3.5–5.3)
Sodium: 138 meq/L (ref 135–145)

## 2023-10-12 LAB — WHOLE BLOOD GLUCOSE POCT
Whole Blood Glucose POCT: 336 mg/dL — ABNORMAL HIGH (ref 70–100)
Whole Blood Glucose POCT: 286 mg/dL — ABNORMAL HIGH (ref 70–100)
Whole Blood Glucose POCT: 311 mg/dL — ABNORMAL HIGH (ref 70–100)

## 2023-10-12 LAB — PT/INR
INR: 4.8 (ref 0.9–1.1)
PT: 53.2 s — ABNORMAL HIGH (ref 10.1–12.9)

## 2023-10-12 LAB — MAGNESIUM: Magnesium: 1.7 mg/dL (ref 1.6–2.6)

## 2023-10-12 MED ORDER — DEXTROSE 10 % IV BOLUS
12.5000 g | INTRAVENOUS | Status: DC | PRN
Start: 2023-10-12 — End: 2023-10-17

## 2023-10-12 MED ORDER — GLUCAGON 1 MG IJ SOLR (WRAP)
1.0000 mg | INTRAMUSCULAR | Status: DC | PRN
Start: 2023-10-12 — End: 2023-10-17

## 2023-10-12 MED ORDER — DEXTROSE 50 % IV SOLN
12.5000 g | INTRAVENOUS | Status: DC | PRN
Start: 2023-10-12 — End: 2023-10-17

## 2023-10-12 MED ORDER — GLUCOSE 40 % PO GEL (WRAP)
15.0000 g | ORAL | Status: DC | PRN
Start: 2023-10-12 — End: 2023-10-17

## 2023-10-12 MED ORDER — INSULIN LISPRO 100 UNIT/ML SOLN (WRAP)
1.0000 [IU] | Freq: Three times a day (TID) | Status: DC
Start: 2023-10-13 — End: 2023-10-17
  Administered 2023-10-13: 3 [IU] via SUBCUTANEOUS
  Administered 2023-10-13: 7 [IU] via SUBCUTANEOUS
  Administered 2023-10-14: 5 [IU] via SUBCUTANEOUS
  Administered 2023-10-14: 3 [IU] via SUBCUTANEOUS
  Administered 2023-10-14 – 2023-10-15 (×2): 1 [IU] via SUBCUTANEOUS
  Administered 2023-10-15: 7 [IU] via SUBCUTANEOUS
  Administered 2023-10-15 – 2023-10-16 (×2): 3 [IU] via SUBCUTANEOUS
  Administered 2023-10-16: 7 [IU] via SUBCUTANEOUS
  Administered 2023-10-17: 3 [IU] via SUBCUTANEOUS
  Administered 2023-10-17: 5 [IU] via SUBCUTANEOUS
  Administered 2023-10-17: 3 [IU] via SUBCUTANEOUS
  Filled 2023-10-12 (×2): qty 6
  Filled 2023-10-12: qty 15
  Filled 2023-10-12 (×3): qty 3
  Filled 2023-10-12: qty 18
  Filled 2023-10-12: qty 15
  Filled 2023-10-12: qty 9
  Filled 2023-10-12: qty 3
  Filled 2023-10-12: qty 9
  Filled 2023-10-12: qty 21
  Filled 2023-10-12: qty 9

## 2023-10-12 MED ORDER — INSULIN LISPRO 100 UNIT/ML SOLN (WRAP)
1.0000 [IU] | Freq: Every evening | Status: DC
Start: 2023-10-12 — End: 2023-10-17
  Administered 2023-10-12 – 2023-10-13 (×2): 4 [IU] via SUBCUTANEOUS
  Administered 2023-10-14 – 2023-10-15 (×2): 2 [IU] via SUBCUTANEOUS
  Administered 2023-10-16: 3 [IU] via SUBCUTANEOUS
  Filled 2023-10-12: qty 12
  Filled 2023-10-12: qty 6
  Filled 2023-10-12: qty 9

## 2023-10-12 MED ORDER — MORPHINE SULFATE 2 MG/ML IJ/IV SOLN (WRAP)
1.0000 mg | Freq: Once | Status: AC
Start: 2023-10-12 — End: 2023-10-12
  Administered 2023-10-12: 1 mg via INTRAVENOUS
  Filled 2023-10-12: qty 1

## 2023-10-12 MED ORDER — DILTIAZEM HCL 60 MG PO TABS
60.0000 mg | ORAL_TABLET | Freq: Four times a day (QID) | ORAL | Status: DC
Start: 2023-10-12 — End: 2023-10-16
  Administered 2023-10-12 – 2023-10-16 (×13): 60 mg via ORAL
  Filled 2023-10-12 (×20): qty 1

## 2023-10-12 NOTE — Plan of Care (Signed)
 Problem: Moderate/High Fall Risk Score >5  Goal: Patient will remain free of falls  Outcome: Progressing  Flowsheets (Taken 10/12/2023 2000)  Moderate Risk (6-13):   MOD-Consider activation of bed alarm if appropriate   MOD-Apply bed exit alarm if patient is confused   MOD-Floor mat at bedside (where available) if appropriate   MOD-Consider a move closer to Nurses Station   MOD-Remain with patient during toileting   MOD-Place bedside commode and assistive devices out of sight when not in use   MOD-Perform dangle, stand, walk (DSW) prior to mobilization   MOD-Request PT/OT consult order for patients with gait/mobility impairment   MOD-include family in multidisciplinary POC discussions     Problem: Compromised Moisture  Goal: Moisture level Interventions  Outcome: Progressing  Flowsheets (Taken 10/12/2023 2000)  Moisture level Interventions: Moisture wicking products, Moisture barrier cream     Problem: Compromised Activity/Mobility  Goal: Activity/Mobility Interventions  Outcome: Progressing  Flowsheets (Taken 10/12/2023 2000)  Activity/Mobility Interventions: Pad bony prominences, TAP Seated positioning system when OOB, Promote PMP, Reposition q 2 hrs / turn clock, Offload heels     Problem: Everyday - Heart Failure  Goal: Stable Vital Signs and Fluid Balance  Outcome: Progressing  Flowsheets (Taken 10/12/2023 2102)  Stable Vital Signs and Fluid Balance:   Daily Standing Weights in the morning using the same scale, after using the bathroom and before breadfast.  If unable to stand, zero the bed and use the bed scale   Monitor, assess vital signs and telemetry per policy   Monitor labs and report abnormalities to physician   Strict Intake/Output   Fluid Restriction   Assess for swelling/edema   Wean oxygen as needed if appropriate  Goal: Mobility/Activity is Maintained at Optimal Level for Patient  Outcome: Progressing  Flowsheets (Taken 10/12/2023 2102)  Mobility/Activity is Maintained at Optimal Level for Patient:    Increase mobility as tolerated/progressive mobility protocol   Maintain SCD's as Ordered   Perform active/passive ROM   Reposition patient every 2 hours and as needed unless able to reposition self   Assess for changes in respiratory status, level of consciousness and/or development of fatigue   Consult with Physical Therapy and/or Occupational Therapy  Goal: Nutritional Intake is Adequate  Outcome: Progressing  Flowsheets (Taken 10/12/2023 2102)  Nutritional Intake is Adequate:   Fluid Restricction if needed   Patient and family teaching on low sodium diet   Assess appetite,anorexia and amount of meal/food tolerated   Encourage/perform oral hygiene as appropriate  Goal: Teaching-Using CHF Warning Zones and Educational Videos  Outcome: Progressing  Flowsheets (Taken 10/12/2023 2102)  Teaching-Using CHF Warning Zones and Educational Videos:   Signs & Symptoms of CHF   Daily Standing Weights & record   CHF Warning Zones and when to call for help   Medications   Fluid Restriction if appropriate   Document in the Education Tab in EPIC with Teach-back     Problem: Inadequate Gas Exchange  Goal: Adequate oxygenation and improved ventilation  Outcome: Progressing  Flowsheets (Taken 10/12/2023 2102)  Adequate oxygenation and improved ventilation:   Assess lung sounds   Monitor SpO2 and treat as needed   Provide mechanical and oxygen support to facilitate gas exchange   Position for maximum ventilatory efficiency   Teach/reinforce use of incentive spirometer 10 times per hour while awake, cough and deep breath as needed   Plan activities to conserve energy: plan rest periods   Increase activity as tolerated/progressive mobility   Consult/collaborate with Respiratory Therapy  Goal: Patent Airway maintained  Outcome: Progressing  Flowsheets (Taken 10/12/2023 2102)  Patent airway maintained:   Position patient for maximum ventilatory efficiency   Provide adequate fluid intake to liquefy secretions   Reinforce use of ordered respiratory  interventions (i.e. CPAP, BiPAP, Incentive Spirometer, Acapella, etc.)   Reposition patient every 2 hours and as needed unless able to self-reposition     Problem: Compromised Sensory Perception  Goal: Sensory Perception Interventions  Outcome: Progressing  Flowsheets (Taken 10/12/2023 2000)  Sensory Perception Interventions: Offload heels, Pad bony prominences, Reposition q 2hrs/turn Clock, Q2 hour skin assessment under devices if present

## 2023-10-12 NOTE — Consults (Signed)
 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  ECOG performance status at 2-3.  Lab data reviewed notable for leukocytosis, hypocalcemia and hyperglycemia.  Reviewed CT chest notable for decrease in the left-sided pleural effusion but still significant loculated effusion on that side noted as well as progression of bilateral lung metastases noted.    Reviewed the findings with patient and his son.  Explained that patient's shortness of breath is likely multifactorial but the underlying cancer and malignant pleural effusion playing a significant role.  Appreciate interventional pulmonology's input.  Discussed the overall setting with patient and his son.  Explained with them that his condition is incurable and any treatment would only be palliative in nature.  Furthermore it appears that there are no further interventions from IP regarding the pleural effusion.  We discussed that chemotherapy alone is associated with a response rate of approximately 20 to 30% and would require at least 6 to 9 weeks of treatment to assess response.  But given patient's decline in performance status tolerating chemotherapy also could be a challenge.    We discussed overall goals of care.  Patient and his son are still hoping to recover enough to pursue systemic treatment as an outpatient.  We can keep that option  open if his overall health condition improves.  In the meantime we did discuss the possibility of DNR/DNI given his underlying terminal disease.  After detailed discussion patient is agreeable to meet with palliative care discussed the possibility of DNR/DNI, setting up advanced directives, possible hospice in the future and also symptom management to maintain some comfort.  Oncology will continue to follow.    Patient is also hypercalcemic possibly a combination of dehydration and his underlying cancer.  Would recommend checking ionized calcium  and if calcium  continues to be elevated might benefit from treatment with zoledronic acid 4 mg IV x 1.  Discussed plan with primary team.    History:   Carl [name] is a 83 y.o. male who presents to the hospital on 10/09/2023 with worsening shortness of breath.  He was noted to be in exacerbation of his chronic diastolic heart failure with rapid A-fib.  In addition he also tested positive for influenza.  He was treated with diuretics, rate control for A-fib and antiviral therapy for his influenza.    During the visit today patient resting in bed.  His son is by the bedside.  They note that he is feeling slightly better compared to his initial presentation.  This morning actually he is comfortably resting in bed.  But he does require assistance for his ADLs.  Also at nighttime he has difficulty getting comfortable and being able to sleep.  He gets short of breath at times as well.  This makes it very difficult for patient to rest comfortably.  Otherwise no new complaints at this time.    Past Medical History:  Past Medical History:   Diagnosis Date    Diabetes mellitus     Hyperlipidemia     Hypertension        Past Surgical History:   Past Surgical History[1]    Family History:   Family History[2]    Social History:     Social History     Socioeconomic History    Marital status: Widowed     Spouse name: Not on file    Number of children: Not on file    Years of  education: Not on file    Highest education level: Not on file   Occupational History    Not on file   Tobacco Use    Smoking status: Former     Types: Cigarettes     Passive exposure: Past    Smokeless tobacco: Not on file    Tobacco comments:     Quit smoking June 1989, smoked 30 years before   Vaping Use    Vaping status: Never Used   Substance and Sexual Activity    Alcohol use: Not Currently    Drug use: Not Currently    Sexual activity: Not Currently   Other Topics Concern    Not on file   Social History Narrative    Not on file     Social Drivers of Health     Financial Resource Strain: Low Risk  (10/10/2023)    Overall Financial Resource Strain (CARDIA)     Difficulty of Paying Living Expenses: Not hard at all   Food Insecurity: No Food Insecurity (10/09/2023)    Hunger Vital Sign     Worried About Running Out of Food in the Last Year: Never true     Ran Out of Food in the Last Year: Never true   Transportation Needs: No Transportation Needs (10/10/2023)    PRAPARE - Therapist, Art (Medical): No     Lack of Transportation (Non-Medical): No   Physical Activity: Inactive (10/08/2023)    Exercise Vital Sign     Days of Exercise per Week: 0 days     Minutes of Exercise per Session: 20 min   Stress: No Stress Concern Present (10/08/2023)    [employer] of Occupational Health - Occupational Stress Questionnaire     Feeling of Stress : Only a little   Social Connections: Moderately Isolated (10/08/2023)    Social Connection and Isolation Panel [NHANES]     Frequency of Communication with Friends and Family: More than three times a week     Frequency of Social Gatherings with Friends and Family: More than three times a week     Attends Religious Services: More than 4 times per year     Active Member of [employer] or Organizations: No     Attends Banker Meetings: Not on file     Marital Status: Widowed   Intimate Partner Violence: Not At Risk (10/09/2023)    Humiliation, Afraid, Rape, and  Kick questionnaire     Fear of Current or Ex-Partner: No     Emotionally Abused: No     Physically Abused: No     Sexually Abused: No   Housing Stability: Low Risk  (10/10/2023)    Housing Stability Vital Sign     Unable to Pay for Housing in the Last Year: No     Number of Times Moved in the Last Year: 0     Homeless in the Last Year:  No       Allergies:   Allergies[3]    Medications:     Current Facility-Administered Medications   Medication Dose Route Frequency    albuterol -ipratropium  3 mL Nebulization Once    albuterol -ipratropium  3 mL Nebulization Q6H SCH    allopurinol   300 mg Oral Daily    atorvastatin   40 mg Oral Daily    budesonide -formoterol   2 puff Inhalation BID    dilTIAZem   60 mg Oral 4 times per day    furosemide   40 mg Intravenous BID    hydroCHLOROthiazide   25 mg Oral Daily    isosorbide  mononitrate  30 mg Oral Daily    metoprolol  succinate  100 mg Oral Daily    oseltamivir   75 mg Oral Q12H       Review of Systems:   Review of Systems   Constitutional:  Positive for malaise/fatigue.   HENT: Negative.     Eyes: Negative.    Respiratory:  Positive for cough and shortness of breath.    Cardiovascular: Negative.    Gastrointestinal: Negative.    Genitourinary: Negative.    Skin: Negative.    Neurological:  Positive for weakness.        Physical Exam:     Vitals:    10/12/23 0630 10/12/23 0700 10/12/23 0720 10/12/23 0748   BP: 130/79   128/77   Pulse: (!) 109 (!) 101 98 94   Resp:    18   Temp:    97.7 F (36.5 C)   TempSrc:    Oral   SpO2:    97%   Weight:       Height:            Intake and Output Summary (Last 24 hours) at Date Time    Intake/Output Summary (Last 24 hours) at 10/12/2023 1017  Last data filed at 10/12/2023 0630  Gross per 24 hour   Intake 800 ml   Output 5 ml   Net 795 ml     Physical Exam  Vitals reviewed.   Constitutional:       General: He is not in acute distress.     Appearance: He is obese. He is not toxic-appearing.   HENT:      Head: Normocephalic and atraumatic.      Nose: Nose  normal. No congestion or rhinorrhea.      Mouth/Throat:      Pharynx: Oropharynx is clear. No posterior oropharyngeal erythema.   Eyes:      General: No scleral icterus.        Right eye: No discharge.         Left eye: No discharge.      Extraocular Movements: Extraocular movements intact.      Conjunctiva/sclera: Conjunctivae normal.   Cardiovascular:      Rate and Rhythm: Normal rate and regular rhythm.      Heart sounds: Normal heart sounds. No murmur heard.     No gallop.   Pulmonary:      Effort: Pulmonary effort is normal. No respiratory distress.      Breath sounds: Examination of the left-middle field reveals decreased breath sounds. Examination of the left-lower field reveals decreased breath sounds. Decreased breath sounds present. No wheezing or rales.   Abdominal:      General: Bowel sounds are normal. There is no distension.      Palpations: Abdomen is soft.      Tenderness: There is no abdominal tenderness.  There is no guarding.   Musculoskeletal:         General: Normal range of motion.      Cervical back: Normal range of motion and neck supple. No rigidity.      Right lower leg: Edema present.      Left lower leg: Edema present.   Lymphadenopathy:      Cervical: No cervical adenopathy.   Skin:     General: Skin is warm.      Coloration: Skin is not jaundiced.   Neurological:      Mental Status: He is alert and oriented to person, place, and time. Mental status is at baseline.      Cranial Nerves: No cranial nerve deficit.   Psychiatric:         Mood and Affect: Mood normal.         Behavior: Behavior normal.        Labs Reviewed:     Results       Procedure Component Value Units Date/Time    Magnesium  [[id number]]  (Normal) Collected: 10/12/23 0420    Specimen: Blood, Venous Updated: 10/12/23 0832     Magnesium  1.7 mg/dL     PT/INR [[id number]]  (Abnormal) Collected: 10/12/23 0420    Specimen: Blood, Venous Updated: 10/12/23 0540     PT 53.2 sec      INR 4.8    Basic Metabolic Panel [[id number]]   (Abnormal) Collected: 10/12/23 0420    Specimen: Blood, Venous Updated: 10/12/23 0533     Glucose 231 mg/dL      BUN 25 mg/dL      Creatinine 0.8 mg/dL      Calcium  10.6 mg/dL      Sodium 861 mEq/L      Potassium 3.2 mEq/L      Chloride 94 mEq/L      CO2 36 mEq/L      Anion Gap 8.0     GFR >60.0 mL/min/1.73 m2               Rads:   XR Chest AP Portable    Result Date: 10/11/2023  1. Interval increase in left perihilar opacity, left-sided pleural effusion and associated left basilar atelectasis and/or pneumonia. 2. There has been no significant change in the appearance of the right lung. 3. Reported bilateral pulmonary nodules on the CT scan are less well delineated on this study. Please refer to the CT scan for further delineation. Lindie Cunning, MD 10/11/2023 7:15 AM    CT Angio Chest (PE or Trauma Protocol)    Result Date: 10/09/2023   1. No CT evidence of pulmonary embolism. 2. Pleural-based mass and bilateral pulmonary nodules are increased in size 3. Interim insertion of left-sided chest tube with decreased loculated left-sided pleural effusion J. Charlie Hoe, MD 10/09/2023 2:46 PM    XR Chest AP Portable    Result Date: 10/05/2023   Slight decrease in left pleural effusion since prior study Jeana Sor, MD 10/05/2023 3:01 PM       Signed by: Jocelyne Robin, MD        [facility]  [address]   [place] [address]  [phone]        [1]   Past Surgical History:  Procedure Laterality Date    CARDIAC SURGERY  2004    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    PLACEMENT, INDWELLING CATHETER PLEURAL Left 09/19/2023    Procedure: PLACEMENT, INDWELLING TUNNELED CATHETER, PLEURAL;  Surgeon: Maree Jinnie RAMAN, MD;  Location: [facility];  Service: Pulmonary;  Laterality: Left;  UNIVERSAL    SKIN BIOPSY      TONSILLECTOMY      ULTRASOUND, ENDOBRONCHIAL (EBUS) N/A 09/19/2023    Procedure: ULTRASOUND, ENDOBRONCHIAL (EBUS);  Surgeon: Maree Jinnie RAMAN, MD;  Location: [facility];  Service:  Pulmonary;  Laterality: N/A;   [2]   Family History  Problem Relation Name Age of Onset    Lung cancer Brother     [3]   Allergies  Allergen Reactions    Cephalexin Rash

## 2023-10-12 NOTE — Progress Notes (Signed)
 Brief background: This patient was admitted due to shortness of breath, was found to be in CHF exacerbation with underlying atrial fibrillation with RVR.  Patient also tested positive for flu.    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    Last 24 Hrs: Pt with ongoing SOB, NC titrated to 2.5. Night hospitalist informed as patient has persistent SOB. Nebs given per schedule.     LDA (drains, central lines, foley, & the indication): Pleurex cath in place, G18 RAC     Discharge barriers: Medical clearance     Abnormal labs/VS (needing any orders):   K - 3.2 - replaced  PT INR - 4.8    Respiratory (changes in oxygenation):  NC @ 2.5L, with persistent complaint of SOB     GI/GU (changes in output, BM, foley): cont x 2     Neuro changes: AxOx4     Nutrition: Heart Healthy diet     Activity/ Safety/ Mobility questions (need for PT/OT, sitter): x1 with walker     Tele/ Rhythm (changes?): Afib in tele     Skin/ Wounds: Scattered bruising, small tear to left hand, Blanchable redness to sacrum, BLE Pitting edema     VTE prophylaxis: Target goal for PT INR is less than 2     Upcoming Procedures: Drain pleurX next Wednesday     Case management needs: TBD     Discharge plan: TBD

## 2023-10-12 NOTE — Progress Notes (Signed)
 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  Patient also was found to have the flu. Otherwise patient is resting comforably on 2L o2.     Plan:       #Acute on chronic diastolic heart failure:   -Continue home thiazide-substitute HCTZ for his chlorthalidone   -Continue Lasix  40 mg IV twice daily  -daily weights, I/O  -long term telemetry  -     #SOB  --remains short of breath, this is likely from tumor burden  however could possibly be from afib?  --consulted cadiology   --Cont continue to diurese as above  -- Continue to wean oxygen as tolerated  -- Discussed with IR for troubleshoot Pleurx as has not been drained for 4 days  -- Messaged oncology given findings on CT scan that shows mass in the lung.    #Atrial fibrillation with RVR  -resume home metoprolol  100mg  daily  -should be safe for diltiazem  if this is needed  -Long term telemetry as above  --increased dilt to 60mg  TID   --Cadiology consulted.   -takes coumadin -unclear why this medication but long standing, managing as below     #Coumadin  management:    f/u INR 4.8 cont to hold coumadin     -Continue tamiflu      #)Leukocytosis:    --Likely secondary malignancy  --Continue monitor     #Loculated left pleural effusion with PleurX placement:  malignant, drained every 3 days,   --Drainage today    #HTN:  -continue metoprolol  as above  -continue thiazide as above  -continue imdur  30mg  daily     #Gout:  chronic without acute flare  -continue home allopurinol  300mg  daily    #GOC:   Had lenghty discussion with patient and son regarding  symptoms.Explained that the symptoms is most likely due to progression of disease particularly the mass in his lung.Consulted cardiology to rule out cardiac involvement and consulted palliative care  --f/u Palliative care.        #Oropharyngeal cancer:  he is to start chemotherapy and immunotherapy in the coming weeks; has not had any treatments thus far     Chemical DVT ppx  FULL code  Telemetry needs:  long term as above  Case discussed with: patient     Safety Checklist:     DVT prophylaxis:  CHEST guideline (See page [id number]) Chemical   Foley:  [place] Rn Foley protocol Not present   IVs:  Peripheral IV   PT/OT: Ordered   Daily CBC & or Chem ordered:  SHM/ABIM guidelines (see #5) Yes, due to clinical and lab instability   Reference for approximate charges of common labs: CBC auto diff - $76  BMP - $99  Mg - $79    Lines:     Patient Lines/Drains/Airways Status       Active PICC Line / CVC Line / PIV Line / Drain / Airway / Intraosseous Line / Epidural Line / ART Line / Line / Wound / Pressure Ulcer / NG/OG Tube       Name Placement date Placement time Site Days    Peripheral IV 10/09/23 18  G Standard Right Antecubital 10/09/23  1222  Antecubital  less than 1                     Disposition: (Please see PAF column for Expected D/C Date)   Today's date: 10/12/2023  Admit Date: 10/09/2023 11:58 AM  LOS: 3  Clinical Milestones: Afiv RVR   Anticipated discharge ne:       Subjective     CC: 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  NAD  NAEON  Rest of ROS same as HPI    Review of Systems:     As per HPI    Physical Exam:     VITAL SIGNS PHYSICAL EXAM   Temp:  [97.3 F (36.3 C)-98.2 F (36.8 C)] 97.3 F (36.3 C)  Heart Rate:  [94-120] 120  Resp Rate:  [16-23] 17  BP: (110-130)/(66-79) 117/74  Blood Glucose:        Intake/Output Summary (Last 24 hours) at 10/12/2023 1346  Last data filed at 10/12/2023 0630  Gross per 24 hour   Intake 560 ml   Output 5 ml   Net 555 ml    Physical Exam  General: awake, alert X  3  Cardiovascular: regular rate and rhythm, no murmurs, rubs or gallops  Lungs: clear to auscultation bilaterally, without wheezing, rhonchi, or rales  Abdomen: soft, non-tender, non-distended; no palpable masses,  normoactive bowel sounds  Extremities: 2+ edema         Meds:     Medications were reviewed:  Current Facility-Administered Medications   Medication Dose Route Frequency    albuterol -ipratropium  3 mL Nebulization Once    albuterol -ipratropium  3 mL Nebulization Q6H SCH    allopurinol   300 mg Oral Daily    atorvastatin   40 mg Oral Daily    budesonide -formoterol   2 puff Inhalation BID    dilTIAZem   60 mg Oral 4 times per day    furosemide   40 mg Intravenous BID    hydroCHLOROthiazide   25 mg Oral Daily    isosorbide  mononitrate  30 mg Oral Daily    metoprolol  succinate  100 mg Oral Daily    oseltamivir   75 mg Oral Q12H     Infusion Meds[1]  PRN Medications[2]      Labs:     Labs (last 72 hours):    Recent Labs   Lab 10/10/23  0335 10/09/23  1225   WBC 16.04* 16.71*   Hemoglobin 12.9 12.8   Hematocrit 38.4 38.9   Platelet Count 234 271       Recent Labs   Lab 10/12/23  0420 10/11/23  0312   PT 53.2* 68.1*   INR 4.8 6.1*    Recent Labs   Lab 10/12/23  0420 10/11/23  0312 10/10/23  0335 10/09/23  1225   Sodium 138 137  More results in Results Review 140   Potassium 3.2* 2.9*  More results in Results Review 3.5   Chloride 94* 94*  More results in Results Review 97*   CO2 36* 33*  More results in Results Review 34*   BUN 25 26  More results in Results Review 25   Creatinine 0.8 0.8  More results in Results Review 0.8   Calcium  10.6* 10.7*  More results in Results Review 10.9*   Albumin   --   --   --  2.0*   Protein, Total  --   --   --  6.0   Bilirubin, Total  --   --   --  0.5   Alkaline Phosphatase  --   --   --  158*   ALT  --   --   --  27   AST (SGOT)  --   --   --  36   Glucose 231* 258*  More results in Results Review 136*   More results in Results Review = values in this interval not displayed.                    Microbiology, reviewed and are significant for:  Microbiology Results (last 15 days)       Procedure Component Value Units Date/Time    COVID-19 and Influenza ([place]) (symptomatic) [[id number]]  (Abnormal) Collected: 10/09/23 1228    Order Status: Completed Specimen: Swab from Anterior Nares Updated: 10/09/23 1327     SARS-CoV-2 (COVID-19) RNA Not Detected     Influenza A RNA Detected     Influenza B RNA Not Detected    Narrative:      A result of Detected indicates POSITIVE for the presence of viral RNA  A result of Not Detected indicates NEGATIVE for the presence of viral RNA    Test performed using the Roche cobas [place] SARS-CoV-2 & Influenza A/B assay. This is a multiplex real-time RT-PCR assay for the detection of SARS-CoV-2, influenza A, and influenza B virus RNA. Viral nucleic acids may persist in vivo, independent of viability. Detection of viral nucleic acid does not imply the presence of infectious virus, or that virus nucleic acid is the cause of clinical symptoms. Negative results do not preclude SARS-CoV-2, influenza A, and/or influenza B infection and should not be used as the sole basis for diagnosis, treatment or other patient management decisions. Invalid results may be due to inhibiting substances in the specimen and recollection should occur.             Imaging, reviewed and are significant for:  XR Chest AP Portable    Result Date: 10/11/2023  1. Interval increase in left perihilar opacity, left-sided pleural effusion and associated left basilar atelectasis and/or pneumonia. 2. There has been no significant change in the appearance of the right lung. 3. Reported bilateral pulmonary nodules on the CT scan are less well delineated on this study. Please refer to the CT scan for further delineation. Lindie Cunning, MD 10/11/2023 7:15 AM    CT Angio Chest (PE or Trauma Protocol)    Result Date: 10/09/2023   1. No CT evidence of pulmonary embolism. 2. Pleural-based mass and bilateral pulmonary  nodules are increased in size 3. Interim insertion of left-sided chest tube with decreased loculated left-sided pleural effusion J. Charlie Hoe, MD 10/09/2023 2:46 PM    XR Chest AP Portable    Result Date: 10/05/2023   Slight decrease in left pleural effusion since prior study Jeana Sor, MD 10/05/2023 3:01 PM         Signed by: Damian LELON Rim, MD           [1]   Current Facility-Administered Medications   Medication Dose Route Frequency Last Rate   [2]   Current Facility-Administered Medications   Medication Dose Route    acetaminophen   650 mg Oral    benzocaine -menthol   1 lozenge Buccal    benzonatate   100 mg Oral    carboxymethylcellulose sodium  1 drop Both Eyes    dextrose   15 g of glucose Oral    Or    dextrose   12.5 g Intravenous  Or    dextrose   12.5 g Intravenous    Or    glucagon  (rDNA)  1 mg Intramuscular    magnesium  sulfate  1 g Intravenous    melatonin  3 mg Oral    naloxone   0.2 mg Intravenous    potassium & sodium phosphates   2 packet Oral    potassium chloride   0-60 mEq Oral    Or    potassium chloride   0-60 mEq Oral    Or    potassium chloride   10 mEq Intravenous    saline  2 spray Each Nare

## 2023-10-12 NOTE — Plan of Care (Signed)
 Problem: Moderate/High Fall Risk Score >5  Goal: Patient will remain free of falls  Outcome: Progressing  Flowsheets (Taken 10/11/2023 2000)  Moderate Risk (6-13):   MOD-Consider activation of bed alarm if appropriate   MOD-Apply bed exit alarm if patient is confused   MOD-Floor mat at bedside (where available) if appropriate   MOD-Consider a move closer to Nurses Station   MOD-Remain with patient during toileting   MOD-Place bedside commode and assistive devices out of sight when not in use   MOD-Perform dangle, stand, walk (DSW) prior to mobilization   MOD-Request PT/OT consult order for patients with gait/mobility impairment   MOD-include family in multidisciplinary POC discussions     Problem: Compromised Moisture  Goal: Moisture level Interventions  Outcome: Progressing  Flowsheets (Taken 10/11/2023 2000)  Moisture level Interventions: Moisture wicking products, Moisture barrier cream     Problem: Compromised Activity/Mobility  Goal: Activity/Mobility Interventions  Outcome: Progressing  Flowsheets (Taken 10/11/2023 2000)  Activity/Mobility Interventions: Pad bony prominences, TAP Seated positioning system when OOB, Promote PMP, Reposition q 2 hrs / turn clock, Offload heels     Problem: Day of Admission - Heart Failure  Goal: Heart Failure Admission  Outcome: Progressing  Flowsheets (Taken 10/12/2023 0157)  Heart Failure Admission:   Vital signs and telemetry per policy   Strict Intake/Output   Fluid restriction   Oxygen as needed   Assess for swelling/edema and document     Problem: Everyday - Heart Failure  Goal: Stable Vital Signs and Fluid Balance  Outcome: Progressing  Flowsheets (Taken 10/12/2023 0157)  Stable Vital Signs and Fluid Balance:   Daily Standing Weights in the morning using the same scale, after using the bathroom and before breadfast.  If unable to stand, zero the bed and use the bed scale   Monitor, assess vital signs and telemetry per policy   Monitor labs and report abnormalities to physician    Strict Intake/Output   Fluid Restriction   Assess for swelling/edema   Wean oxygen as needed if appropriate  Goal: Mobility/Activity is Maintained at Optimal Level for Patient  Outcome: Progressing  Flowsheets (Taken 10/12/2023 0157)  Mobility/Activity is Maintained at Optimal Level for Patient:   Increase mobility as tolerated/progressive mobility protocol   Reposition patient every 2 hours and as needed unless able to reposition self   Assess for changes in respiratory status, level of consciousness and/or development of fatigue   Consult with Physical Therapy and/or Occupational Therapy     Problem: Inadequate Gas Exchange  Goal: Adequate oxygenation and improved ventilation  Outcome: Progressing  Flowsheets (Taken 10/12/2023 0157)  Adequate oxygenation and improved ventilation:   Assess lung sounds   Monitor SpO2 and treat as needed   Provide mechanical and oxygen support to facilitate gas exchange   Position for maximum ventilatory efficiency   Teach/reinforce use of incentive spirometer 10 times per hour while awake, cough and deep breath as needed   Plan activities to conserve energy: plan rest periods   Increase activity as tolerated/progressive mobility   Consult/collaborate with Respiratory Therapy  Goal: Patent Airway maintained  Outcome: Progressing  Flowsheets (Taken 10/12/2023 0157)  Patent airway maintained:   Position patient for maximum ventilatory efficiency   Provide adequate fluid intake to liquefy secretions   Reposition patient every 2 hours and as needed unless able to self-reposition     Problem: Compromised Sensory Perception  Goal: Sensory Perception Interventions  Outcome: Progressing  Flowsheets (Taken 10/11/2023 2000)  Sensory Perception Interventions: Offload heels, Pad  bony prominences, Reposition q 2hrs/turn Clock, Q2 hour skin assessment under devices if present

## 2023-10-12 NOTE — Plan of Care (Signed)
 Problem: Moderate/High Fall Risk Score >5  Goal: Patient will remain free of falls  Flowsheets (Taken 10/12/2023 0830)  Moderate Risk (6-13):   MOD-Consider activation of bed alarm if appropriate   MOD-Apply bed exit alarm if patient is confused   MOD-Floor mat at bedside (where available) if appropriate   MOD-Consider a move closer to [facility]   MOD-Place bedside commode and assistive devices out of sight when not in use   MOD-Remain with patient during toileting   MOD-Re-orient confused patients   MOD-Perform dangle, stand, walk (DSW) prior to mobilization   MOD-Use gait belt when appropriate   MOD-include family in multidisciplinary POC discussions     Problem: Compromised Activity/Mobility  Goal: Activity/Mobility Interventions  Flowsheets (Taken 10/12/2023 1539)  Activity/Mobility Interventions: Pad bony prominences, TAP Seated positioning system when OOB, Promote PMP, Reposition q 2 hrs / turn clock, Offload heels     Problem: Compromised Sensory Perception  Goal: Sensory Perception Interventions  Outcome: Progressing  Flowsheets (Taken 10/12/2023 0820)  Sensory Perception Interventions: Offload heels, Pad bony prominences, Reposition q 2hrs/turn Clock, Q2 hour skin assessment under devices if present     Problem: Everyday - Heart Failure  Goal: Stable Vital Signs and Fluid Balance  Outcome: Progressing  Flowsheets (Taken 10/12/2023 1539)  Stable Vital Signs and Fluid Balance:   Daily Standing Weights in the morning using the same scale, after using the bathroom and before breadfast.  If unable to stand, zero the bed and use the bed scale   Monitor, assess vital signs and telemetry per policy   Fluid Restriction   Monitor labs and report abnormalities to physician   Wean oxygen as needed if appropriate   Assess for swelling/edema  Goal: Mobility/Activity is Maintained at Optimal Level for Patient  Outcome: Progressing  Flowsheets (Taken 10/12/2023 1539)  Mobility/Activity is Maintained at Optimal Level for  Patient:   Increase mobility as tolerated/progressive mobility protocol   Reposition patient every 2 hours and as needed unless able to reposition self   Assess for changes in respiratory status, level of consciousness and/or development of fatigue   Consult with Physical Therapy and/or Occupational Therapy     Problem: Inadequate Gas Exchange  Goal: Adequate oxygenation and improved ventilation  Outcome: Progressing  Flowsheets (Taken 10/12/2023 1539)  Adequate oxygenation and improved ventilation:   Assess lung sounds   Provide mechanical and oxygen support to facilitate gas exchange   Teach/reinforce use of incentive spirometer 10 times per hour while awake, cough and deep breath as needed   Monitor SpO2 and treat as needed   Monitor and treat ETCO2   Position for maximum ventilatory efficiency   Plan activities to conserve energy: plan rest periods   Increase activity as tolerated/progressive mobility  Goal: Patent Airway maintained  Outcome: Progressing  Flowsheets (Taken 10/12/2023 1539)  Patent airway maintained:   Position patient for maximum ventilatory efficiency   Reinforce use of ordered respiratory interventions (i.e. CPAP, BiPAP, Incentive Spirometer, Acapella, etc.)   Reposition patient every 2 hours and as needed unless able to self-reposition   Provide adequate fluid intake to liquefy secretions   Suction secretions as needed

## 2023-10-13 ENCOUNTER — Other Ambulatory Visit: Payer: Self-pay | Admitting: Medical Oncology

## 2023-10-13 DIAGNOSIS — Z66 Do not resuscitate: Secondary | ICD-10-CM

## 2023-10-13 DIAGNOSIS — T402X5A Adverse effect of other opioids, initial encounter: Secondary | ICD-10-CM

## 2023-10-13 DIAGNOSIS — K5903 Drug induced constipation: Secondary | ICD-10-CM

## 2023-10-13 DIAGNOSIS — R0602 Shortness of breath: Secondary | ICD-10-CM

## 2023-10-13 DIAGNOSIS — Z515 Encounter for palliative care: Secondary | ICD-10-CM

## 2023-10-13 DIAGNOSIS — I4819 Other persistent atrial fibrillation: Secondary | ICD-10-CM

## 2023-10-13 DIAGNOSIS — Z7189 Other specified counseling: Secondary | ICD-10-CM

## 2023-10-13 DIAGNOSIS — C109 Malignant neoplasm of oropharynx, unspecified: Secondary | ICD-10-CM

## 2023-10-13 LAB — COMPREHENSIVE METABOLIC PANEL
ALT: 25 U/L (ref <=55)
AST (SGOT): 36 U/L (ref <=41)
Albumin/Globulin Ratio: 0.5 — ABNORMAL LOW (ref 0.9–2.2)
Albumin: 1.9 g/dL — ABNORMAL LOW (ref 3.5–5.0)
Alkaline Phosphatase: 156 U/L — ABNORMAL HIGH (ref 37–117)
Anion Gap: 14 (ref 5.0–15.0)
BUN: 24 mg/dL (ref 9–28)
Bilirubin, Total: 0.6 mg/dL (ref 0.2–1.2)
CO2: 35 meq/L — ABNORMAL HIGH (ref 17–29)
Calcium: 10.8 mg/dL — ABNORMAL HIGH (ref 7.9–10.2)
Chloride: 88 meq/L — ABNORMAL LOW (ref 99–111)
Creatinine: 0.9 mg/dL (ref 0.5–1.5)
GFR: >60 mL/min/{1.73_m2} (ref >=60.0)
Globulin: 3.9 g/dL — ABNORMAL HIGH (ref 2.0–3.6)
Glucose: 138 mg/dL — ABNORMAL HIGH (ref 70–100)
Potassium: 3.4 meq/L — ABNORMAL LOW (ref 3.5–5.3)
Protein, Total: 5.8 g/dL — ABNORMAL LOW (ref 6.0–8.3)
Sodium: 137 meq/L (ref 135–145)

## 2023-10-13 LAB — WHOLE BLOOD GLUCOSE POCT
Whole Blood Glucose POCT: 239 mg/dL — ABNORMAL HIGH (ref 70–100)
Whole Blood Glucose POCT: 302 mg/dL — ABNORMAL HIGH (ref 70–100)
Whole Blood Glucose POCT: 165 mg/dL — ABNORMAL HIGH (ref 70–100)
Whole Blood Glucose POCT: 423 mg/dL — ABNORMAL HIGH (ref 70–100)
Whole Blood Glucose POCT: 298 mg/dL — ABNORMAL HIGH (ref 70–100)

## 2023-10-13 LAB — PT/INR
INR: 4.3 (ref 0.9–1.1)
PT: 48.2 s — ABNORMAL HIGH (ref 10.1–12.9)

## 2023-10-13 LAB — CALCIUM, IONIZED: Calcium, Ionized: 2.74 meq/L — ABNORMAL HIGH (ref 2.23–2.56)

## 2023-10-13 MED ORDER — SENNA 8.6 MG PO TABS
8.6000 mg | ORAL_TABLET | Freq: Every day | ORAL | Status: DC | PRN
Start: 2023-10-13 — End: 2023-10-17

## 2023-10-13 MED ORDER — MORPHINE SULFATE (CONCENTRATE) 100 MG/5ML PO SOLN
2.0000 mg | Freq: Four times a day (QID) | ORAL | Status: DC | PRN
Start: 2023-10-13 — End: 2023-10-17
  Administered 2023-10-14 – 2023-10-17 (×13): 2 mg via ORAL
  Filled 2023-10-13 (×11): qty 1

## 2023-10-13 MED ORDER — SENNA 8.6 MG PO TABS
8.6000 mg | ORAL_TABLET | Freq: Every evening | ORAL | Status: DC
Start: 2023-10-13 — End: 2023-10-17
  Administered 2023-10-13 – 2023-10-16 (×4): 8.6 mg via ORAL
  Filled 2023-10-13 (×4): qty 1

## 2023-10-13 NOTE — Progress Notes (Signed)
 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  Overnight dyspnea appears to be improved.  But still quite weak and condition tenuous with guarded prognosis.  Reviewed goals of care with patient and he as well as his son wish to pursue systemic treatment as outpatient.  They are tentatively scheduled to start their first cycle of treatment as an outpatient on October 19, 2023.    Reviewed with them that treatment can be associated with significant adverse effects and there is a risk of death as well.  Particularly given his underlying comorbidities the risk of adverse events or increased.  We will await cardiology evaluation to determine next steps and whether he is stable enough to pursue chemotherapy.  Also recommended that they consider becoming DNR/DNI.  Plan reviewed with patient and his son.    History:     Carl [name] is resting in bed during the visit today.  His son was seen at the bedside.  They both note that last night was more comfortable and he was able to rest better compared to the previous night.  But still requiring assistance with all of his activities.  He gets short of breath with exertion.  No new complaints.    Allergies:   Allergies[1]    Medications:     Current Facility-Administered Medications   Medication Dose Route Frequency    albuterol -ipratropium  3 mL Nebulization Once    albuterol -ipratropium  3 mL Nebulization Q6H SCH    allopurinol   300 mg Oral Daily    atorvastatin   40 mg Oral Daily    budesonide -formoterol   2 puff Inhalation BID    dilTIAZem   60 mg Oral 4 times per day     furosemide   40 mg Intravenous BID    hydroCHLOROthiazide   25 mg Oral Daily    insulin  lispro  1-4 Units Subcutaneous QHS    insulin  lispro  1-8 Units Subcutaneous TID AC    isosorbide  mononitrate  30 mg Oral Daily    metoprolol  succinate  100 mg Oral Daily    oseltamivir   75 mg Oral Q12H       Review of Systems:   Review of Systems   Constitutional:  Positive for malaise/fatigue.   HENT: Negative.     Eyes: Negative.    Respiratory:  Positive for cough. Negative for shortness of breath.    Cardiovascular: Negative.    Gastrointestinal: Negative.    Genitourinary: Negative.    Skin: Negative.    Neurological:  Positive for weakness.        Physical Exam:     Vitals:    10/13/23 0431 10/13/23 0647 10/13/23 0845 10/13/23 0846   BP: 102/65 122/75 106/79    Pulse: 98 95 90    Resp: 19 19 17     Temp: 97.8 F (36.6 C) 97.3 F (36.3 C) 97.5 F (36.4 C)    TempSrc: Oral Oral Oral    SpO2: 95% 94% 96%    Weight:    76.6 kg (168 lb 12.8 oz)   Height:  Intake and Output Summary (Last 24 hours) at Date Time    Intake/Output Summary (Last 24 hours) at 10/13/2023 9072  Last data filed at 10/13/2023 0400  Gross per 24 hour   Intake 320 ml   Output --   Net 320 ml     Physical Exam  Vitals reviewed.   Constitutional:       General: He is not in acute distress.     Appearance: He is obese. He is not toxic-appearing.   HENT:      Head: Normocephalic and atraumatic.   Eyes:      General: No scleral icterus.     Extraocular Movements: Extraocular movements intact.      Conjunctiva/sclera: Conjunctivae normal.   Cardiovascular:      Rate and Rhythm: Normal rate and regular rhythm.      Heart sounds: Normal heart sounds. No murmur heard.     No gallop.   Pulmonary:      Effort: Pulmonary effort is normal. No respiratory distress.      Breath sounds: Examination of the left-middle field reveals decreased breath sounds. Examination of the left-lower field reveals decreased breath sounds. Decreased breath sounds present. No wheezing  or rales.   Abdominal:      General: Bowel sounds are normal. There is no distension.      Palpations: Abdomen is soft.      Tenderness: There is no abdominal tenderness. There is no guarding.   Musculoskeletal:         General: Normal range of motion.      Cervical back: Normal range of motion and neck supple. No rigidity.      Right lower leg: Edema present.      Left lower leg: Edema present.   Lymphadenopathy:      Cervical: No cervical adenopathy.   Skin:     General: Skin is warm.      Coloration: Skin is not jaundiced.   Neurological:      Mental Status: He is alert and oriented to person, place, and time. Mental status is at baseline.      Cranial Nerves: No cranial nerve deficit.   Psychiatric:         Mood and Affect: Mood normal.         Behavior: Behavior normal.        Labs Reviewed:     Results       Procedure Component Value Units Date/Time    Whole Blood Glucose POCT [[id number]]  (Abnormal) Collected: 10/13/23 0847    Specimen: Blood, Capillary Updated: 10/13/23 0901     Whole Blood Glucose POCT 239 mg/dL     PT/INR [[id number]]  (Abnormal) Collected: 10/13/23 0430    Specimen: Blood, Venous Updated: 10/13/23 0534     PT 48.2 sec      INR 4.3    Whole Blood Glucose POCT [[id number]]  (Abnormal) Collected: 10/12/23 2034    Specimen: Blood, Capillary Updated: 10/12/23 2058     Whole Blood Glucose POCT 311 mg/dL     Whole Blood Glucose POCT [[id number]]  (Abnormal) Collected: 10/12/23 1620    Specimen: Blood, Capillary Updated: 10/12/23 1625     Whole Blood Glucose POCT 286 mg/dL     Whole Blood Glucose POCT [[id number]]  (Abnormal) Collected: 10/12/23 1219    Specimen: Blood, Capillary Updated: 10/12/23 1222     Whole Blood Glucose POCT 336 mg/dL  Rads:   XR Chest AP Portable    Result Date: 10/11/2023  1. Interval increase in left perihilar opacity, left-sided pleural effusion and associated left basilar atelectasis and/or pneumonia. 2. There has been no significant change in the  appearance of the right lung. 3. Reported bilateral pulmonary nodules on the CT scan are less well delineated on this study. Please refer to the CT scan for further delineation. Lindie Cunning, MD 10/11/2023 7:15 AM    CT Angio Chest (PE or Trauma Protocol)    Result Date: 10/09/2023   1. No CT evidence of pulmonary embolism. 2. Pleural-based mass and bilateral pulmonary nodules are increased in size 3. Interim insertion of left-sided chest tube with decreased loculated left-sided pleural effusion J. Charlie Hoe, MD 10/09/2023 2:46 PM       Signed by: Jocelyne Robin, MD        [facility]  [address]   [place] [address]  [phone]        [1]   Allergies  Allergen Reactions    Cephalexin Rash

## 2023-10-13 NOTE — Consults (Addendum)
 [place] CARDIOLOGY CONSULTATION    Date Time: 10/13/23 8:49 AM  Patient Name: Carl [name]  Requesting Physician: Venora Damian ORN, MD  Admission Date: 10/09/2023   Primary Cardiologist: Lamar Platts, MD    Reason for Consultation:   HF and AF management     History:   Carl [name] is a 83 y.o. male with a PMHx HTN, HLD, DM type II, CAD s/p CABG, Afib (on coumadin ), HFpEF, OSA, COPD, ex-smoker, was recently admitted on 1/5 to 09/20/23 at [facility]  with fatigue and SOB. He was noted to have left-sided pleural effusion with an infiltrate as well as extensive adenopathy in the chest and abdomen. 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 But on the CT abdomen and pelvis no GI tumor source was identified. 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 He also had PleurX placement due to malignant pleural effusion, who now presented to the hospital on 10/09/2023 with SOB secondary to decompensated HF with Afib w/ RVR, in setting of influenza. During this hospitalization his CT-Chest (2/3) showed small loculated effusion, pleural-base mass w/increase in size of bilateral pulmonary nodules and r/o PE. Cardiology consulted for HF and Afib management.     Patient has hx of HFpEF on Lasix  40mg  qD and despite taking his diuretic has been having worsening SOB/DOE for the past 2 months. Patient now has marked DOE (from bed to bathroom), + orthopnea w/ increase LE edema. During this hospitalization, patient started on IV Lasix  40mg  BID and as well Diltiazem  (currently on Diltiazem  60mg  q6hrs), in addition to his home dose Toprol  xl 100mg  qD.     His pBNP (2,425) w/ albumin  of 2.0.        Past Medical History:     He has a past medical history of Diabetes mellitus, Hyperlipidemia, and Hypertension. He has a past surgical  history that includes Cardiac surgery (2004); TONSILLECTOMY; Skin biopsy; ULTRASOUND, ENDOBRONCHIAL (EBUS) (N/A, 09/19/2023); and PLACEMENT, INDWELLING CATHETER PLEURAL (Left, 09/19/2023).    Medications:     Home Meds:   Medications Prior to Admission   Medication Sig    albuterol  sulfate HFA (PROVENTIL ) 108 (90 Base) MCG/ACT inhaler Inhale 2 puffs into the lungs every 4 (four) hours as needed for Shortness of Breath    allopurinol  (ZYLOPRIM ) 300 MG tablet Take 1 tablet (300 mg) by mouth daily    atorvastatin  (LIPITOR) 40 MG tablet Take 1 tablet (40 mg) by mouth daily    chlorthalidone  25 MG tablet Take 0.5 tablets (12.5 mg) by mouth daily    dupilumab  (DUPIXENT ) 300 MG/2ML injection Inject 300 mg into the skin every 14 (fourteen) days On tues    furosemide  (LASIX ) 20 MG tablet Take 1 tablet (20 mg) by mouth daily    glipiZIDE  (GLUCOTROL ) 10 MG 24 hr tablet Take 1 tablet (10 mg) by mouth daily    GLUCOSAMINE-CHONDROITIN PO Take 2 tablets by mouth every morning (Glucosamine HCI 1500 mg and Chondroitin Sulfate 1200 mg)    isosorbide  mononitrate (IMDUR ) 30 MG 24 hr tablet Take 1 tablet (30 mg) by mouth daily    metFORMIN  (GLUCOPHAGE -XR) 500 MG 24 hr tablet Take 2 tablets (1,000 mg) by mouth 2 (two) times daily    metoprolol  succinate (TOPROL -XL) 100 MG 24 hr tablet Take 1 tablet (100 mg) by mouth daily    Mometasone   Furo-Formoterol  Fum (Dulera) 200-5 MCG/ACT Aerosol Inhale 2 puffs into the lungs 2 (two) times daily    OneTouch Ultra test strip Use As Directed    Vitamin D, Cholecalciferol, 50 MCG (2000 UT) Cap Take 2,000 Units by mouth every morning    warfarin (COUMADIN ) 5 MG tablet Take 1.5 tablets (7.5 mg) by mouth every evening (Patient taking differently: Take 0.5 tablets (2.5 mg) by mouth every evening INR 2-3 goal)    clobetasol (TEMOVATE) 0.05 % cream Apply topically daily as needed (for eczema)      Scheduled Meds:     albuterol -ipratropium, 3 mL, Nebulization, Once  albuterol -ipratropium, 3 mL, Nebulization,  Q6H SCH  allopurinol , 300 mg, Oral, Daily  atorvastatin , 40 mg, Oral, Daily  budesonide -formoterol , 2 puff, Inhalation, BID  dilTIAZem , 60 mg, Oral, 4 times per day  furosemide , 40 mg, Intravenous, BID  hydroCHLOROthiazide , 25 mg, Oral, Daily  insulin  lispro, 1-4 Units, Subcutaneous, QHS  insulin  lispro, 1-8 Units, Subcutaneous, TID AC  isosorbide  mononitrate, 30 mg, Oral, Daily  metoprolol  succinate, 100 mg, Oral, Daily  oseltamivir , 75 mg, Oral, Q12H      Continuous Infusions:      PRN Meds:  acetaminophen , benzocaine -menthol , benzonatate , carboxymethylcellulose sodium, dextrose  **OR** dextrose  **OR** dextrose  **OR** glucagon  (rDNA), magnesium  sulfate, melatonin, naloxone , potassium & sodium phosphates , potassium chloride  **OR** potassium chloride  **OR** potassium chloride , saline      Physical Exam:     Vital Signs: BP 106/79   Pulse 90   Temp 97.5 F (36.4 C) (Oral)   Resp 17   Ht 1.651 m (5' 5)   Wt 78.7 kg (173 lb 9.6 oz)   SpO2 96%   BMI 28.89 kg/m      Intake/Output Summary (Last 24 hours) at 10/13/2023 0700  Last data filed at 10/13/2023 0400  Gross per 24 hour   Intake 600 ml   Output --   Net 600 ml     General: awake, alert, no acute distress.  Cardiovascular: irregular rate and rhythm, distant heart sounds,  no murmur  rubs or gallops     Lungs: decrease breath sound on left compared to right; +crackles bil; + NC w/o respiratory distress (at home not on O2)  Abdomen: soft, non-tender, non-distended, normoactive bowel sounds  Extremities: +3 bil edema up to mid shin b/l     Cardiographics:     ECG: Afib at 108 bpm   Telemetry: Afib w/ periods of RVR, at 90s-110s bpm .   Echocardiogram:   TTE (09/15/23):  Summary    * Technically difficult echocardiogram due to poor acoustic windows.    * Contrast was administered to enable adequate wall motion interpretation.    * Left ventricular systolic function is hyperdynamic with an ejection  fraction by Biplane Method of Discs of  73 %.    * Left ventricular  segmental wall motion is grossly normal, but subtle wall  motion abnormalities cannot be excluded as technically limited study.    * Left ventricular diastolic function could not be assessed as this was a  technically limited study.    * Mildly decreased right ventricular systolic function.    * Evaluation of fibrosclerosis vs stenosis of the aortic valve cannot be  determined due to technical inability to capture the maximum Doppler  transaortic velocity.    * No pulmonary hypertension with estimated right ventricular systolic  pressure of  32 mmHg.    * Compared to the prior study dated 09/22/2022, RV function has decreased.  TTE (09/2022)  Summary    * The left ventricle is normal in size.    * There is normal left ventricular geometry.    * Left ventricular systolic function is normal with an ejection fraction by  Biplane Method of Discs of  67 %.    * Left ventricular segmental wall motion is normal.    * Left ventricular diastolic filling parameters demonstrate normal diastolic  function.    * The right ventricular cavity size is normal in size.    * Normal right ventricular systolic function.    * The left atrium is mildly dilated.    * There is mild aortic valve sclerosis/fibrocalcific changes vs mild aortic  stenosis.    * No pulmonary hypertension with estimated right ventricular systolic  pressure of  21 mmHg.    * There is moderate mitral annular calcification.    * No prior study is available for comparison.    Nuclear stress test:   MPS (09/30/22):   Summary    1. There is a small sized area of decreased activity in the mid  inferolateral wall which is mild in intensity and reversible on rest images.  This is consistent with a small area of ischemia in the territory of the LCx  or RCA.    2. Gated wall motion study demonstrates normal left ventricular function  with calculated ejection fraction >65% at rest and post stress.    3. Frequent unifocal PVCs.    4. No prior studies are available for  comparison.    CXR  10/11/23:   1. Interval increase in left perihilar opacity, left-sided pleural effusion  and associated left basilar atelectasis and/or pneumonia.  2. There has been no significant change in the appearance of the right  lung.  3. Reported bilateral pulmonary nodules on the CT scan are less well  delineated on this study. Please refer to the CT scan for further  delineation.      Labs Reviewed:     No results for input(s): WBC, HGB, HCT, PLT in the last 72 hours.  No results for input(s): TROPI, ISTATTROPONI, CK, CKMB, BNP in the last 72 hours. Recent Labs     10/12/23  0420 10/11/23  0312   Sodium 138 137   Potassium 3.2* 2.9*   CO2 36* 33*   BUN 25 26   Creatinine 0.8 0.8   Glucose 231* 258*   Magnesium  1.7  --     Estimated Creatinine Clearance: 68.9 mL/min (based on SCr of 0.8 mg/dL).   Recent Labs     10/13/23  0430 10/12/23  0420 10/11/23  0312   INR 4.3 4.8 6.1*         Latest Reference Range & Units 09/11/23 06:17 10/09/23 12:25   AST <=41 U/L 43 (H) 36   ALT <=55 U/L 17 27   Alkaline Phosphatase 37 - 117 U/L 141 (H) 158 (H)   Albumin  3.5 - 5.0 g/dL 2.4 (L) 2.0 (L)   Protein Total 6.0 - 8.3 g/dL 6.5 6.0   Globulin 2.0 - 3.6 g/dL 4.1 (H) 4.0 (H)   Albumin /Globulin Ratio 0.9 - 2.2  0.6 (L) 0.5 (L)   Bilirubin Total 0.2 - 1.2 mg/dL 0.6 0.5   (H): Data is abnormally high  (L): Data is abnormally low   Latest Reference Range & Units 09/15/23 11:25 10/09/23 12:25   NT-proBNP <450 pg/mL 1,326 (H) 2,425 (H)   (H): Data is abnormally high  Rads:  Radiology Results (24 Hour)       ** No results found for the last 24 hours. **            Assessment:   83 y.o. male with a PMHx HTN, HLD, DM type II, CAD s/p CABG, Afib (on coumadin ), HFpEF, OSA, COPD, ex-smoker, was recently admitted on 1/5 to 09/20/23 at [facility]  with fatigue and SOB. He was noted to have left-sided pleural effusion with an infiltrate as well as extensive adenopathy in the chest and abdomen. 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 But on the CT abdomen and pelvis no GI tumor source was identified. 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 He also had PleurX placement due to malignant pleural effusion, who now presented to the hospital on 10/09/2023 with SOB secondary to decompensated HF with Afib w/ RVR, in setting of influenza. During this hospitalization his CT-Chest (2/3) showed small loculated effusion, pleural-base mass w/increase in size of bilateral pulmonary nodules and r/o PE. Cardiology consulted for HF and Afib management.       Dx:     Acute on chronic dHpEF w/ decrease RVSF  SOB is multifactorial- SCC carcinoma w/ malignant pleural effusion   LE edema- also third spacing from malignant w/ hypoalbuminemia (2.0 10/11/23)  Atrial fibrillation  w/ periods of RVR  HX of CAD s/p CABG w/ negative HS-TropI x 3 during this admission       Plan:   Continue Lasix  at 40 IV bid , recommend albumin  infusion support if feasible   Agree w/ rate management w/ Toprol  xl 100mg  qD   May add diltiazem  for rate control as BP tolerates,  goal HR < 110 bpm at this time.   At this time would not do DCCV as his symptoms are multifactorial and LVEF is normal ; may consider DCCV if overall symptoms fail to improve w/ diuretic and oncological therapy , or if the rate is difficult to control .   Cont w/ Coumadin  and INR goal: 2-3; outpatient follow up w/ anticoagulation clinic to manage coumadin  dosing   Cont w/ Lipitor, Imdur  30mg  qD    I saw and examined the patient and spent 30 minutes providing care on the day of service including time spent in chart and data review, documentation, ordering, speaking with the patient and family, and communicating with the care team.    Signed by: Maryetta Borg, PA    Cardiology Attending Addendum:     I have  reviewed the H&P, seen and examined the pt this PM with Hardik .  I revised the note as above and  agree with Hardik's assessment and plan.    Gera Inboden, MD, PhD  [facility] Cardiology   [facility] NP Spectralink [phone] or [phone] (M-F 8 am-5 pm)   After Hours On-Call Physician: [phone]    rr:[id number], Hadassah, MD

## 2023-10-13 NOTE — Progress Notes (Signed)
 Brief background: This patient was admitted due to shortness of breath, was found to be in CHF exacerbation with underlying atrial fibrillation with RVR.  Patient also tested positive for flu.     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     Last 24 Hrs: Patient is less short of breath during the shift. NC maintained at 2L. PRN morphine  given per request. Patient slept well.     LDA (drains, central lines, foley, & the indication): Pleurex cath in place, G18 RAC     Discharge barriers: Medical clearance     Abnormal labs/VS (needing any orders):   PT INR - 4.3    Respiratory (changes in oxygenation):  NC @ 2L     GI/GU (changes in output, BM, foley): cont x 2     Neuro changes: AxOx4     Nutrition: Heart Healthy diet     Activity/ Safety/ Mobility questions (need for PT/OT, sitter): x1 with walker     Tele/ Rhythm (changes?): Afib in tele     Skin/ Wounds: Scattered bruising, small tear to left hand, Blanchable redness to sacrum, BLE Pitting edema, RLQ incision.     VTE prophylaxis: on hold, Target goal for PT INR is less than 2     Upcoming Procedures: Palliative consult. Drain pleurX next Wednesday     Case management needs: TBD     Discharge plan: TBD

## 2023-10-13 NOTE — Consults (Signed)
 [name]  Available daily from [phone]  Main office: [phone]  Pager: [phone]     Palliative Care Consult   Date Time: 10/13/23 12:57 PM   Patient Name: Carl [name], Carl [name]   Location: QN417/QN417.98   Attending Physician: Venora Damian ORN, MD   Primary Care Physician: Lon Ip, MD   Consulting Provider: Geni Plumber, MD   Consulting Service: Palliative Medicine and Comprehensive Care  Consult request from Venora Damian ORN, MD to see patient regarding:   Reason for Referral: Symptom (dyspnea, nausea, anxiety, fatigue, etc.); Clarify goals of care; Advance care planning; End of life     Palliative Diagnosis Category: Cancer  Patient Type: New       Assessment & Plan   Impression   Carl [name] 83 y.o. 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        Recommendations   Goals of Care:   10/13/2023:   -Discussion this admission with oncology:   We discussed overall goals of care. Patient and his son are still hoping to recover enough to pursue systemic treatment as an outpatient. We can keep that option open if his overall health condition improves. In the meantime we did discuss the possibility of DNR/DNI given his underlying terminal disease. After detailed discussion patient is agreeable to meet with palliative care discussed the possibility of DNR/DNI, setting up advanced directives, possible hospice in the future and also symptom management to maintain some comfort.    -Today this provider met with patient, son and daughter-in-law bedside. Oncology visited today and expressed concern about patient tolerating chemotherapy which is to begin next week outpatient.   -Patient and son shared about patient's wife's death in 26-Aug-2010 and how they had to make difficult decisions about life support without her input. They appreciated a guided discussion about healthcare wishes. Patient shared that he would not want  artificial resuscitation or to be intubated as we discussed the life-limiting nature of his cancer and progression of disease in his lungs. He is open to palliative cancer treatment to try to prolong life and quality of life now. He does feel exhausted most of the time now and does have anxiety producing work of breathing mainly at night.   -National POLST form completed today signed by patient. Original copy to family. Copy hand delivered to [name] Records to be scanned into Epic medical record. Code status in Epic changed to reflect completed POST form. POST from completed for:       ==> Section A: Cardiopulmonary Resuscitation Orders            []  YES CPR: Attempt Resuscitation                [x]  NO CPR: Do Not Attempt Resuscitation        ==> Section B: Initial Treatment Orders            []  Full Treatments (required if choose CPR in section A)                [x]  Selective Treatments Additional Interventions                []  Comfort-Focused Treatments         ==> Section D: Medically Assisted Nutrition            []  Provide feeding through new or existing surgically-placed tubes            []  Trial period  for artificial nutrition but no surgically-placed tubes            []  No artifical means of nutrition desired            [x]  Not discussed or no decision made (provided standard of care)     ACP Validation: Advance care planning discussion initialized  Surrogate Decision Maker: Next of Kin status: son: they will consider a second surrogate but not ready today  ACP Document: DDNR/POST    CODE STATUS: Full Code     Recommend:   -Change to no CPR, no intubation  -POLST completed as above  -Palliative referral placed to follow at [facility]  -Family considering designation of second surrogate after son Carl [name], who is an only child  -Otherwise goal to pursue palliative cancer treatment as long as well tolerated    Dyspnea:   Found much relief in one time dose of morphine  1mg  iv; does have more  anxiety around work of breathing at night. Discussed opioid as standard of care for this symptom and patient and family interested in trialing before home.   Recommend:   -Start morphine  concentrated solution, concentration 20mg /86mL; start 2mg  SL q6hr prn moderate pain or work of breathing/dyspnea  -Patient feels he is benefiting from nebs and family requesting neb machine and neb solution.   -Concern for aspiration on this provider's exam; recommend SLP eval.   -History concerning for OSA which may be worsening clinical picture now    Slow transit constipation:   Goal for at least one soft bowel movement every other day while on opioid therapy.    Recommend:   -Start senna 1 tab po qhs  -Start senna 1 tab daily prn if no bm x 48 hours  -Titrate regimen to above goal.     Psychosocial: no other nearby family; son has been staying with dad to help    Spiritual: [name].     PC Team follow-up plans: in the next few days      Discharge Disposition: OP follow up - [name]      Outpatient Follow Up Recommended: Yes    Outcomes: Family meetings, Improved non-pain symptom therapy, Clarified goals of care, Provided advance care planning, Changed CPR status, POST counseling, and POST completion  Total time on unit today in care of Carl [name] including chart review, face to face evaluation, and management. >50% of time spent in counseling & coordination of care with Patient, Family, Consultants, Bedside Nurse, and Hospitalist with recommendations above.    Start Time:                     Stop Time:   Start Time:                     Stop Time:    Geni Plumber, MD, MD/NP  Palliative Medicine & Comprehensive Care  Phone Number: FX: [phone], Mon-Fri 9a-4p / Xtend Pager: [phone] (24/7)     History of Presenting Illness   Carl [name] is a 83 y.o. male admitted to hospital on 10/09/2023 with Influenza A [J10.1]  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  [I50.33]     83 yo male with SCC of unknown primary possibly head and neck with mets to lungs, lymph nodes and pleural effusion s/p pleurx placement, also COPD, afib on AC, HFpEF, DM, HTN, gout. He is hospitalized 10/09/23 with influenza and presented with rapid afib  and decompensated CHF and was begun on diuresis and rate control. He is treatment naive for OP cancer and is planned to start chemotherapy and immunotherapy soon with Dr. Jeffie. New imaging reveals increased bilateral tumor burden in lung and loculated effusion which may explain reduced pleurX output.     Pain scores zero over day  He has been able to ambulate with walker but short distance and limited by dyspnea  He feels generally exhausted but denies pain, nausea, poor appetite, sleep affected by coughing fits and dyspnea more at night       Goals of Care   CURRENT CPR Status: Full Code       Advanced Care Planning:      Decisional Capacity: yes      Advance Directives have been completed in the past: no      Advance Directives are available in chart: no      Discussed this admission: yes      ACP note completed: yes       Date:        Surrogate Decision Maker: Next of Kin status: son:       Contact information: Jonothan Rakers (Son) [phone] (Mobile)       Outcome of Discussion: treatment/curative pathway        Palliative Functional and Symptom Assessment      ADLs prior to admission:    Independent Needs assistance Dependent   Ambulation []  [x]  []    Transferring [x]  []  []    Dressing [x]  []  []    Bathing [x]  []  []    Toileting [x]  []  []    Feeding [x]  []  []      Palliative Performance Scale: 50% - Mainly sit/lie, unable to do any work, extensive disease, considerable assistance required with self-care, normal or reduced intake, full LOC or confusion    FAST Score (Dementia Patients): N/A    ECOG (Cancer Patients): 3 - Capable of only limited self-care, confined to bed or chair more than 50% of waking hours       Review of Systems   []  Unable to  obtain secondary to   [x]  As per HPI and physical exam, otherwise all systems negative    Pain: none  Pain Location and Description:   General: Fatigue, decreased energy, no F/C/S, no Headache, no insomnia  Eyes: no visual changes, eye pain, eye redness  Mouth:  no oral sores, lesions or thrush   Pulm: no cough, wheeze, or congestion, no dyspnea no pleuritic pain  Cards: no chest pain or palpitations, no edema  GI: anorexia, early satiety, constipation or diarrhea / last BM ,  no abdominal pain or bloating, no weight loss  GU: no hematuria, dysuria, or incontinence, no urinary retention   Skin: no rash, lesions no skin breakdown, no puritis  MS: no new muscle weakness, joint stiffness, aches, swelling  Neuro: no focal weakness, numbness, tingling, no dizziness/vertigo   Psyche: Mood is  no history of Anxiety or depression  Heme: no easy bleeding, bruising, petechiae        Past Medical, Surgical and Family History   Medical History[1]   Past Surgical History[2]   Family History[3]    Social History   Substance Use:    reports that he has quit smoking. His smoking use included cigarettes. He has been exposed to tobacco smoke. He does not have any smokeless tobacco history on file.    reports that he does not currently use alcohol.    reports that he does  not currently use drugs.     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, now enjoys activities in senior living complex and loves to do his shopping but can't recently    Cultural Concerns:    Nurse, Learning Disability needed: [x]  NO   []  YES                                       Language: English  ID#_________    Spirituality and Importance: Baptist      Medications   Scheduled Meds  Current Facility-Administered Medications   Medication Dose Route Frequency    albuterol -ipratropium  3 mL Nebulization Once    albuterol -ipratropium  3 mL Nebulization Q6H SCH    allopurinol   300  mg Oral Daily    atorvastatin   40 mg Oral Daily    budesonide -formoterol   2 puff Inhalation BID    dilTIAZem   60 mg Oral 4 times per day    furosemide   40 mg Intravenous BID    hydroCHLOROthiazide   25 mg Oral Daily    insulin  lispro  1-4 Units Subcutaneous QHS    insulin  lispro  1-8 Units Subcutaneous TID AC    isosorbide  mononitrate  30 mg Oral Daily    metoprolol  succinate  100 mg Oral Daily    oseltamivir   75 mg Oral Q12H      DRIPS     PRN MEDS  PRN Medications[4]    Allergies   Allergies[5]    Physical Exam   BP 105/67   Pulse 78   Temp 97.7 F (36.5 C) (Oral)   Resp 16   Ht 1.651 m (5' 5)   Wt 76.6 kg (168 lb 12.8 [name])   SpO2 94%   BMI 28.09 kg/m    Physical Exam:  General: well developed sitting at EOB eating lunch   HEENT:  EOMI, sclera anicteric, OP/OC clear without lesions or thrush, MMM  Neck: supple, FROM   CV: irreg irreg, no murmurs, rubs   Lungs:reduced at bases, no wheeze (right after neb), mild push of speech   Abd: soft, NT, ND, +bs, no rebound or guarding  Ext: large pitting le edema bilaterally   Neuro: awake, alert, oriented x 3, no focal deficits, later does close eyes at times but still converses, appears tired  Psych:  appropriate insight and judgement, no psychomotor agitation     Labs / Radiology   Lab and diagnostics: reviewed in Epic  Recent Labs   Lab 10/10/23  0335   WBC 16.04*   Hemoglobin 12.9   Hematocrit 38.4   Platelet Count 234       Recent Labs   Lab 10/13/23  0430   PT 48.2*   INR 4.3        Recent Labs   Lab 10/12/23  0420   Sodium 138   Potassium 3.2*   Chloride 94*   CO2 36*   BUN 25   Creatinine 0.8   GFR >60.0   Glucose 231*   Calcium  10.6*     Recent Labs   Lab 10/09/23  1225   Bilirubin, Total 0.5   Protein, Total 6.0   Albumin  2.0*   ALT 27   AST (SGOT) 36          XR Chest AP  Portable    Result Date: 10/11/2023  1. Interval increase in left perihilar opacity, left-sided pleural effusion and associated left basilar atelectasis and/or pneumonia. 2. There has been  no significant change in the appearance of the right lung. 3. Reported bilateral pulmonary nodules on the CT scan are less well delineated on this study. Please refer to the CT scan for further delineation. Lindie Cunning, MD 10/11/2023 7:15 AM    CT Angio Chest (PE or Trauma Protocol)    Result Date: 10/09/2023   1. No CT evidence of pulmonary embolism. 2. Pleural-based mass and bilateral pulmonary nodules are increased in size 3. Interim insertion of left-sided chest tube with decreased loculated left-sided pleural effusion J. Charlie Hoe, MD 10/09/2023 2:46 PM                  [1]   Past Medical History:  Diagnosis Date    Diabetes mellitus     Hyperlipidemia     Hypertension    [2]   Past Surgical History:  Procedure Laterality Date    CARDIAC SURGERY  2004    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    PLACEMENT, INDWELLING CATHETER PLEURAL Left 09/19/2023    Procedure: PLACEMENT, INDWELLING TUNNELED CATHETER, PLEURAL;  Surgeon: Maree Jinnie RAMAN, MD;  Location: [id number];  Service: Pulmonary;  Laterality: Left;  UNIVERSAL    SKIN BIOPSY      TONSILLECTOMY      ULTRASOUND, ENDOBRONCHIAL (EBUS) N/A 09/19/2023    Procedure: ULTRASOUND, ENDOBRONCHIAL (EBUS);  Surgeon: Maree Jinnie RAMAN, MD;  Location: [facility];  Service: Pulmonary;  Laterality: N/A;   [3]   Family History  Problem Relation Name Age of Onset    Lung cancer Brother     [4]   Current Facility-Administered Medications   Medication Dose    acetaminophen   650 mg    benzocaine -menthol   1 lozenge    benzonatate   100 mg    carboxymethylcellulose sodium  1 drop    dextrose   15 g of glucose    Or    dextrose   12.5 g    Or    dextrose   12.5 g    Or    glucagon  (rDNA)  1 mg    magnesium  sulfate  1 g    melatonin  3 mg    naloxone   0.2 mg    potassium & sodium phosphates   2 packet    potassium chloride   0-60 mEq    Or    potassium chloride   0-60 mEq    Or    potassium chloride   10 mEq    saline  2 spray   [5]   Allergies  Allergen Reactions    Cephalexin Rash

## 2023-10-13 NOTE — ACP (Advance Care Planning) (Signed)
 Patient Care Conference Note    Patient Name: Carl [name]:      Meeting Date: 10/13/23         Purpose of Meeting  Discuss Goals of care  Discuss Plan of care  Discuss Disposition  Discuss code status    Meeting Participants   Patient , Child (ren), and Palliative Care Physician    Medical Decision Maker      Does Caley Volkert have medical decision-making capacity?  Yes   The patient did  participate in the meeting.      Summary of Medical Condition/ Treatment Options/ Prognosis  83 y.o. 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       Patient's or Decision Maker's Perspective, Wishes, and Goals for Treatment  D-Today this provider met with patient, son and daughter-in-law bedside. Oncology visited today and expressed concern about patient tolerating chemotherapy which is to begin next week outpatient.   -Patient and son shared about patient's wife's death in 25-Aug-2010 and how they had to make difficult decisions about life support without her input. They appreciated a guided discussion about healthcare wishes. Patient shared that he would not want artificial resuscitation or to be intubated as we discussed the life-limiting nature of his cancer and progression of disease in his lungs. He is open to palliative cancer treatment to try to prolong life and quality of life now. He does feel exhausted most of the time now and does have anxiety producing work of breathing mainly at night.   -National POLST form completed today signed by patient. Original copy to family. Copy hand delivered to [facility] Records to be scanned into Epic medical record. Code status in Epic changed to reflect completed POST form. POST from completed for:       ==> Section A: Cardiopulmonary Resuscitation Orders            []  YES CPR: Attempt Resuscitation                [x]  NO CPR: Do Not Attempt Resuscitation        ==>  Section B: Initial Treatment Orders            []  Full Treatments (required if choose CPR in section A)                [x]  Selective Treatments Additional Interventions                []  Comfort-Focused Treatments         ==> Section D: Medically Assisted Nutrition            []  Provide feeding through new or existing surgically-placed tubes            []  Trial period for artificial nutrition but no surgically-placed tubes            []  No artifical means of nutrition desired            [x]  Not discussed or no decision made (provided standard of care)       Code Status and Medical Interventions Discussed   Code Status (patient has no pulse)  CPR - Comments: DNR/DNI, avoid ICU care    Advance Directive Documentation  Existing Documents that were Reviewed/Discussed with Patient/Decision Maker:  None  Documents Filled Out as Part of Discussion:  Physician Orders for Scope of Treatment (POST)    Medical Decisions and Plan of Care  -Change to no CPR, no intubation  -POLST completed as above  -Palliative referral placed to follow at [facility] clinic  -Family considering designation of second surrogate after son Gene, who is an only child  -Otherwise goal to pursue palliative cancer treatment as long as well tolerated      This information was shared with: Primary team, bedside nurse, case manager, and oncologist    Start time: 1330  End Time: 1403    I have spent 33 minutes on face-to-face Advance Care Planning Services. 100% of the time was spent on discussion and counseling the patient, son, and DIL. No active management of the problems listed above was undertaken during the time period reported.       Signed by: Geni Plumber, MD

## 2023-10-13 NOTE — Plan of Care (Addendum)
 4 eyes in 4 hours pressure injury assessment note:      Completed with: RN Arriane  Unit & Time admitted:  Skin check done 2040           Bony Prominences: Check appropriate box; if wound is present enter wound assessment in LDA     Occiput:                 [x] WNL  []  Wound present  Face:                     [x] WNL  []  Wound present  Ears:                      [x] WNL  []  Wound present  Spine:                    [x] WNL  []  Wound present  Shoulders:             [x] WNL  []  Wound present  Elbows:                  [x] WNL  []  Wound present  Sacrum/coccyx:     [x] WNL  []  Wound present  Ischial Tuberosity:  [x] WNL  []  Wound present  Trochanter/Hip:      [x] WNL  []  Wound present  Knees:                   [x] WNL  []  Wound present  Ankles:                   [x] WNL  []  Wound present  Heels:                    [x] WNL  []  Wound present  Other pressure areas:  []  Wound location       Device related: []  Device name:         LDA completed if wound present: yes/no  Consult WOCN if necessary    Other skin related issues, ie tears, rash, etc, document in Integumentary flowsheet      Problem: Moderate/High Fall Risk Score >5  Goal: Patient will remain free of falls  Outcome: Progressing  Flowsheets (Taken 10/13/2023 1950)  Moderate Risk (6-13):   MOD-Consider activation of bed alarm if appropriate   MOD-Apply bed exit alarm if patient is confused   MOD-Floor mat at bedside (where available) if appropriate   MOD-Consider a move closer to Nurses Station   MOD-Remain with patient during toileting   MOD-Place bedside commode and assistive devices out of sight when not in use   MOD-Utilize diversion activities   MOD-Perform dangle, stand, walk (DSW) prior to mobilization   MOD-Request PT/OT consult order for patients with gait/mobility impairment   MOD-include family in multidisciplinary POC discussions   LOW-Fall Interventions Appropriate for Low Fall Risk   LOW-Anticoagulation education for injury risk     Problem: Compromised  Moisture  Goal: Moisture level Interventions  Outcome: Progressing  Flowsheets (Taken 10/13/2023 1950)  Moisture level Interventions: Moisture wicking products, Moisture barrier cream     Problem: Compromised Activity/Mobility  Goal: Activity/Mobility Interventions  Outcome: Progressing  Flowsheets (Taken 10/13/2023 1950)  Activity/Mobility Interventions: Pad bony prominences, TAP Seated positioning system when OOB, Promote PMP, Reposition q 2 hrs / turn clock, Offload heels     Problem: Everyday - Heart Failure  Goal: Stable Vital Signs and Fluid Balance  Outcome: Progressing  Flowsheets (Taken  10/13/2023 2323)  Stable Vital Signs and Fluid Balance:   Daily Standing Weights in the morning using the same scale, after using the bathroom and before breadfast.  If unable to stand, zero the bed and use the bed scale   Monitor labs and report abnormalities to physician   Monitor, assess vital signs and telemetry per policy   Wean oxygen as needed if appropriate   Strict Intake/Output   Fluid Restriction   Assess for swelling/edema  Goal: Mobility/Activity is Maintained at Optimal Level for Patient  Outcome: Progressing  Flowsheets (Taken 10/13/2023 2323)  Mobility/Activity is Maintained at Optimal Level for Patient:   Increase mobility as tolerated/progressive mobility protocol   Maintain SCD's as Ordered   Perform active/passive ROM   Assess for changes in respiratory status, level of consciousness and/or development of fatigue   Reposition patient every 2 hours and as needed unless able to reposition self   Consult with Physical Therapy and/or Occupational Therapy  Goal: Nutritional Intake is Adequate  Outcome: Progressing  Flowsheets (Taken 10/13/2023 2323)  Nutritional Intake is Adequate:   Cardiac diet-2 gm Sodium   Fluid Restricction if needed   Consult/Collaborate with Nutritionist   Patient and family teaching on low sodium diet   Encourage/perform oral hygiene as appropriate   Assess appetite,anorexia and amount of  meal/food tolerated  Goal: Teaching-Using CHF Warning Zones and Educational Videos  Outcome: Progressing  Flowsheets (Taken 10/13/2023 2323)  Teaching-Using CHF Warning Zones and Educational Videos:   Signs & Symptoms of CHF   Daily Standing Weights & record   CHF Warning Zones and when to call for help   Medications   Fluid Restriction if appropriate   Sodium Restriction   Document in the Education Tab in EPIC with Teach-back     Problem: Inadequate Gas Exchange  Goal: Adequate oxygenation and improved ventilation  Outcome: Progressing  Flowsheets (Taken 10/13/2023 2323)  Adequate oxygenation and improved ventilation:   Assess lung sounds   Monitor SpO2 and treat as needed   Provide mechanical and oxygen support to facilitate gas exchange   Monitor and treat ETCO2   Position for maximum ventilatory efficiency   Teach/reinforce use of incentive spirometer 10 times per hour while awake, cough and deep breath as needed   Plan activities to conserve energy: plan rest periods   Increase activity as tolerated/progressive mobility   Consult/collaborate with Respiratory Therapy  Goal: Patent Airway maintained  Outcome: Progressing  Flowsheets (Taken 10/13/2023 2323)  Patent airway maintained:   Position patient for maximum ventilatory efficiency   Provide adequate fluid intake to liquefy secretions   Suction secretions as needed   Reposition patient every 2 hours and as needed unless able to self-reposition   Reinforce use of ordered respiratory interventions (i.e. CPAP, BiPAP, Incentive Spirometer, Acapella, etc.)     Problem: Compromised Sensory Perception  Goal: Sensory Perception Interventions  Outcome: Progressing  Flowsheets (Taken 10/13/2023 1950)  Sensory Perception Interventions: Offload heels, Pad bony prominences, Reposition q 2hrs/turn Clock, Q2 hour skin assessment under devices if present     Problem: Compromised Friction/Shear  Goal: Friction and Shear Interventions  Outcome: Progressing  Flowsheets (Taken 10/13/2023  1950)  Friction and Shear Interventions: Pad bony prominences, Off load heels, HOB 30 degrees or less unless contraindicated, Consider: TAP seated positioning, Heel foams     Problem: Safety  Goal: Patient will be free from injury during hospitalization  Outcome: Progressing  Flowsheets (Taken 10/13/2023 2323)  Patient will be free from injury during  hospitalization:   Assess patient's risk for falls and implement fall prevention plan of care per policy   Provide and maintain safe environment   Use appropriate transfer methods   Hourly rounding   Include patient/ family/ care giver in decisions related to safety   Ensure appropriate safety devices are available at the bedside   Assess for patients risk for elopement and implement Elopement Risk Plan per policy  Goal: Patient will be free from infection during hospitalization  Outcome: Progressing  Flowsheets (Taken 10/13/2023 2323)  Free from Infection during hospitalization:   Assess and monitor for signs and symptoms of infection   Monitor lab/diagnostic results   Monitor all insertion sites (i.e. indwelling lines, tubes, urinary catheters, and drains)   Encourage patient and family to use good hand hygiene technique     Problem: Psychosocial and Spiritual Needs  Goal: Demonstrates ability to cope with hospitalization/illness  Outcome: Progressing  Flowsheets (Taken 10/13/2023 2323)  Demonstrates ability to cope with hospitalizations/illness:   Encourage verbalization of feelings/concerns/expectations   Provide quiet environment   Assist patient to identify own strengths and abilities   Reinforce positive adaptation of new coping behaviors   Encourage participation in diversional activity   Encourage patient to set small goals for self   Include patient/ patient care companion in decisions     Problem: Altered GI Function  Goal: Fluid and electrolyte balance are achieved/maintained  Outcome: Progressing  Flowsheets (Taken 10/13/2023 2323)  Fluid and electrolyte balance  are achieved/maintained:   Monitor/assess lab values and report abnormal values   Assess and reassess fluid and electrolyte status   Observe for cardiac arrhythmias   Monitor for muscle weakness     Problem: Fluid and Electrolyte Imbalance/ Endocrine  Goal: Fluid and electrolyte balance are achieved/maintained  Outcome: Progressing  Flowsheets (Taken 10/13/2023 2323)  Fluid and electrolyte balance are achieved/maintained:   Monitor/assess lab values and report abnormal values   Assess and reassess fluid and electrolyte status   Observe for cardiac arrhythmias   Monitor for muscle weakness  Goal: Adequate hydration  Outcome: Progressing     Problem: Nutrition  Goal: Nutritional intake is adequate  Outcome: Progressing  Flowsheets (Taken 10/13/2023 2323)  Nutritional intake is adequate: Consult/collaborate with Clinical Nutritionist     Problem: Diabetes: Glucose Imbalance  Goal: Blood glucose stable at established goal  Outcome: Progressing  Flowsheets (Taken 10/13/2023 2323)  Blood glucose stable at established goal:   Monitor lab values   Monitor intake and output.  Notify LIP if urine output is < 30 mL/hour.   Follow fluid restrictions/IV/PO parameters   Assess for hypoglycemia /hyperglycemia   Ensure adequate hydration   Monitor/assess vital signs   Include patient/family in decisions related to nutrition/dietary selections   Ensure appropriate diet and assess tolerance   Coordinate medication administration with meals, as indicated   Ensure appropriate consults are obtained (Nutrition, Diabetes Education, and Case Management)   Ensure patient/family has adequate teaching materials     Problem: Hemodynamic Status: Cardiac  Goal: Stable vital signs and fluid balance  Outcome: Progressing  Flowsheets (Taken 10/13/2023 2323)  Stable vital signs and fluid balance:   Assess signs and symptoms associated with cardiac rhythm changes   Monitor lab values     Problem: Inadequate Tissue Perfusion  Goal: Adequate tissue perfusion  will be maintained  Outcome: Progressing  Flowsheets (Taken 10/13/2023 2323)  Adequate tissue perfusion will be maintained:   Monitor/assess lab values and report abnormal values   Monitor/assess  neurovascular status (pulses, capillary refill, pain, paresthesia, paralysis, presence of edema)   Monitor/assess for signs of VTE (edema of calf/thigh redness, pain)   Monitor for signs and symptoms of a pulmonary embolism (dyspnea, tachypnea, tachycardia, confusion)   Encourage/assist patient as needed to turn, cough, and perform deep breathing every 2 hours     Problem: Ineffective Gas Exchange  Goal: Effective breathing pattern  Outcome: Progressing  Flowsheets (Taken 10/13/2023 2323)  Effective breathing pattern:   Maintain CO2 level per LIP order   Teach/reinforce use of ordered respiratory interventions (ie. CPAP, BiPAP, Incentive Spirometer, Acapella)     Problem: Impaired Mobility  Goal: Mobility/Activity is maintained at optimal level for patient  Outcome: Progressing  Flowsheets (Taken 10/13/2023 2323)  Mobility/activity is maintained at optimal level for patient:   Consult/collaborate with Physical Therapy and/or Occupational Therapy   Encourage independent activity per ability     Problem: Peripheral Neurovascular Impairment  Goal: Extremity color, movement, sensation are maintained or improved  Outcome: Progressing  Flowsheets (Taken 10/13/2023 2323)  Extremity color, movement, sensation are maintained or improved: Assess extremity for proper alignment     Problem: Compromised skin integrity  Goal: Skin integrity is maintained or improved  Outcome: Progressing  Flowsheets (Taken 10/13/2023 2323)  Skin integrity is maintained or improved:   Assess Braden Scale every shift   Turn or reposition patient every 2 hours or as needed unless able to reposition self   Avoid shearing   Keep skin clean and dry   Relieve pressure to bony prominences   Utilize specialty bed   Keep head of bed 30 degrees or less (unless  contraindicated)   Encourage use of lotion/moisturizer on skin   Increase activity as tolerated/progressive mobility   Monitor patient's hygiene practices     Problem: Nutrition  Goal: Nutritional intake is adequate  Outcome: Progressing  Flowsheets (Taken 10/13/2023 2323)  Nutritional intake is adequate: Consult/collaborate with Clinical Nutritionist     Problem: Droplet Isolation  Goal: Prevent transmission of other diagnosed infection while caring for patient in Droplet isolation  Outcome: Progressing  Flowsheets (Taken 10/13/2023 2323)  Is appropriate isolation sign and PPE outside Patient Room?: Yes  Provided Patient and family education: Verbal education  Strict Hand Hygiene: Yes  Donning and doffing PPE properly both Staff and Visitors: Yes  Multi-dose Medications stored in plastic bags?: Yes

## 2023-10-13 NOTE — Progress Notes (Signed)
PULSE OXIMETRY TESTING:     Document patient's oxygen at rest :   _____% on RA at rest (No ranges please).  IF 88% OR BELOW provide additional O2 and see next step,    _____% on oxygen, at rest on_____LPM via NC (document number of liters needed to maintain 89% or greater)       Next, test patient with ambulation (or exertion in bed IF bed bound) and document below:   _____% on RA with exertion (if 88% or below, provide additional O2 and see next step)   _____% on oxygen, with exertion, at_____LPM via NC (document number of liters needed to maintain 89% or greater)      All tests must be done during the same session.     Please copy and paste the above template and document results in a PROGRESS NOTE.     Testing to qualify for home oxygen must be no earlier than 48 hours prior to discharge, or it will need to be repeated.    Please contact X_______ when completed.

## 2023-10-13 NOTE — Plan of Care (Signed)
 Problem: Moderate/High Fall Risk Score >5  Goal: Patient will remain free of falls  Outcome: Progressing  Flowsheets (Taken 10/13/2023 1043)  Moderate Risk (6-13):   MOD- Consider video monitoring   MOD-include family in multidisciplinary POC discussions   MOD-Use gait belt when appropriate   MOD-Request PT/OT consult order for patients with gait/mobility impairment     Problem: Compromised Moisture  Goal: Moisture level Interventions  Outcome: Progressing  Flowsheets (Taken 10/13/2023 1644)  Moisture level Interventions: Moisture wicking products, Moisture barrier cream     Problem: Compromised Activity/Mobility  Goal: Activity/Mobility Interventions  Outcome: Progressing  Flowsheets (Taken 10/13/2023 1644)  Activity/Mobility Interventions: Pad bony prominences, TAP Seated positioning system when OOB, Promote PMP, Reposition q 2 hrs / turn clock, Offload heels     Problem: Day of Admission - Heart Failure  Goal: Heart Failure Admission  Outcome: Progressing  Flowsheets (Taken 10/13/2023 1644)  Heart Failure Admission:   Standing Weight on admission, if unable to stand zero the bed and use the bed scale   Vital signs and telemetry per policy   Assess for swelling/edema and document   Oxygen as needed   Fluid restriction   Strict Intake/Output   Initiate education with patient and caregiver using CHF Warning Zones and Educational Videos ([employer] or [employer])     Problem: Everyday - Heart Failure  Goal: Stable Vital Signs and Fluid Balance  Outcome: Progressing  Flowsheets (Taken 10/13/2023 1644)  Stable Vital Signs and Fluid Balance:   Daily Standing Weights in the morning using the same scale, after using the bathroom and before breadfast.  If unable to stand, zero the bed and use the bed scale   Monitor, assess vital signs and telemetry per policy   Fluid Restriction   Assess for swelling/edema   Wean oxygen as needed if appropriate   Monitor labs and report abnormalities to physician   Strict Intake/Output  Goal:  Mobility/Activity is Maintained at Optimal Level for Patient  Outcome: Progressing  Flowsheets (Taken 10/13/2023 1644)  Mobility/Activity is Maintained at Optimal Level for Patient:   Increase mobility as tolerated/progressive mobility protocol   Maintain SCD's as Ordered   Perform active/passive ROM   Reposition patient every 2 hours and as needed unless able to reposition self   Consult with Physical Therapy and/or Occupational Therapy   Assess for changes in respiratory status, level of consciousness and/or development of fatigue  Goal: Nutritional Intake is Adequate  Outcome: Progressing  Flowsheets (Taken 10/13/2023 1644)  Nutritional Intake is Adequate:   Fluid Restricction if needed   Assess appetite,anorexia and amount of meal/food tolerated   Patient and family teaching on low sodium diet   Encourage/perform oral hygiene as appropriate  Goal: Teaching-Using CHF Warning Zones and Educational Videos  Outcome: Progressing  Flowsheets (Taken 10/13/2023 1644)  Teaching-Using CHF Warning Zones and Educational Videos:   Signs & Symptoms of CHF   Daily Standing Weights & record   Medications   CHF Warning Zones and when to call for help   Sodium Restriction   Fluid Restriction if appropriate   Document in the Education Tab in EPIC with Teach-back     Problem: Inadequate Gas Exchange  Goal: Adequate oxygenation and improved ventilation  Outcome: Progressing  Flowsheets (Taken 10/13/2023 1644)  Adequate oxygenation and improved ventilation:   Assess lung sounds   Monitor SpO2 and treat as needed   Provide mechanical and oxygen support to facilitate gas exchange   Consult/collaborate with Respiratory Therapy   Plan  activities to conserve energy: plan rest periods  Goal: Patent Airway maintained  Outcome: Progressing  Flowsheets (Taken 10/13/2023 1644)  Patent airway maintained:   Position patient for maximum ventilatory efficiency   Provide adequate fluid intake to liquefy secretions   Suction secretions as needed   Reposition  patient every 2 hours and as needed unless able to self-reposition     Problem: Compromised Sensory Perception  Goal: Sensory Perception Interventions  Outcome: Progressing  Flowsheets (Taken 10/13/2023 1644)  Sensory Perception Interventions: Offload heels, Pad bony prominences, Reposition q 2hrs/turn Clock, Q2 hour skin assessment under devices if present

## 2023-10-13 NOTE — Progress Notes (Signed)
 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  Patient also was found to have the flu. Otherwise patient is resting comforably on 2L o2.     Plan:       #Acute on chronic diastolic heart failure:   -Continue home thiazide-substitute HCTZ for his chlorthalidone   -Continue Lasix  40 mg IV twice daily  -daily weights, I/O  -long term telemetry  -     #SOB  --remains short of breath, this is likely from tumor burden  however could possibly be from afib?  --consulted cadiology   --Cont continue to diurese as above  -- Continue to wean oxygen as tolerated  -- Discussed with IR for troubleshoot Pleurx as has not been drained for 4 days  -- Messaged oncology given findings on CT scan that shows mass in the lung.    #Atrial fibrillation with RVR  -resume home metoprolol  100mg  daily  -should be safe for diltiazem  if this is needed  -Long term telemetry as above  -- Continue dilt to 60mg  TID   --Cadiology consulted.   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  Noted the symptoms are likely due to psychosomatic reasons such as anxiety.  We discussed how palliative care could provide ways to alleviate some of the symptoms.  --f/u Palliative care.        #Oropharyngeal cancer:  he is to start chemotherapy and immunotherapy in the coming weeks; has not had any treatments thus far     Chemical DVT ppx  FULL code  Telemetry needs:  long term as above  Case discussed with: patient     Safety Checklist:     DVT prophylaxis:  CHEST guideline (See page [id number]) Chemical   Foley:  [place]onan Rn Foley protocol Not present   IVs:  Peripheral IV   PT/OT: Ordered   Daily CBC & or Chem ordered:  SHM/ABIM guidelines (see #5) Yes, due to clinical and lab instability   Reference for approximate charges of common labs: CBC auto diff - $76  BMP - $99  Mg - $79    Lines:     Patient Lines/Drains/Airways Status       Active PICC Line / CVC Line / PIV Line / Drain / Airway /  Intraosseous Line / Epidural Line / ART Line / Line / Wound / Pressure Ulcer / NG/OG Tube       Name Placement date Placement time Site Days    Peripheral IV 10/09/23 18 G Standard Right Antecubital 10/09/23  1222  Antecubital  less than 1                     Disposition: (Please see PAF column for Expected D/C Date)   Today's date: 10/13/2023  Admit Date: 10/09/2023 11:58 AM  LOS: 4  Clinical Milestones: Afiv RVR   Anticipated discharge ne:       Subjective     CC: 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  No new complaints,  NAD  No acute events overnight  Rest of ROS same as HPI    Review of Systems:     As per HPI    Physical Exam:     VITAL SIGNS PHYSICAL EXAM   Temp:  [97.3 F (36.3 C)-98.9 F (37.2 C)] 97.7 F (36.5 C)  Heart Rate:  [78-108] 78  Resp Rate:   [16-19] 16  BP: (102-122)/(64-79) 105/67  Blood Glucose:        Intake/Output Summary (Last 24 hours) at 10/13/2023 1344  Last data filed at 10/13/2023 0400  Gross per 24 hour   Intake 320 ml   Output --   Net 320 ml    Physical Exam  General: awake, alert X 3  Cardiovascular: regular rate and rhythm, no murmurs, rubs or gallops  Lungs: clear to auscultation bilaterally, without wheezing, rhonchi, or rales  Abdomen: soft, non-tender, non-distended; no palpable masses,  normoactive bowel sounds  Extremities: 2+ edema         Meds:     Medications were reviewed:  Current Facility-Administered Medications   Medication Dose Route Frequency    albuterol -ipratropium  3 mL Nebulization Once    albuterol -ipratropium  3 mL Nebulization Q6H SCH    allopurinol   300 mg Oral Daily    atorvastatin   40 mg Oral Daily    budesonide -formoterol   2 puff Inhalation BID    dilTIAZem   60 mg Oral 4 times per day    furosemide   40 mg Intravenous BID    hydroCHLOROthiazide   25 mg Oral Daily    insulin  lispro  1-4 Units Subcutaneous QHS    insulin  lispro  1-8 Units Subcutaneous TID AC    isosorbide  mononitrate  30 mg Oral Daily    metoprolol  succinate  100 mg Oral Daily    oseltamivir   75 mg Oral Q12H     Infusion Meds[1]  PRN Medications[2]      Labs:     Labs (last 72 hours):    Recent Labs   Lab 10/10/23  0335 10/09/23  1225   WBC 16.04* 16.71*   Hemoglobin 12.9 12.8   Hematocrit 38.4 38.9   Platelet Count 234 271       Recent Labs   Lab 10/13/23  0430 10/12/23  0420   PT 48.2* 53.2*   INR 4.3 4.8    Recent Labs   Lab 10/12/23  0420 10/11/23  0312 10/10/23  0335 10/09/23  1225   Sodium 138 137  More results in Results Review 140   Potassium 3.2* 2.9*  More results in Results Review 3.5   Chloride 94* 94*  More results in Results Review 97*   CO2 36* 33*  More results in Results Review 34*   BUN  25 26  More results in Results Review 25   Creatinine 0.8 0.8  More results in Results Review 0.8   Calcium  10.6* 10.7*  More results in Results Review  10.9*   Albumin   --   --   --  2.0*   Protein, Total  --   --   --  6.0   Bilirubin, Total  --   --   --  0.5   Alkaline Phosphatase  --   --   --  158*   ALT  --   --   --  27   AST (SGOT)  --   --   --  36   Glucose 231* 258*  More results in Results Review 136*   More results in Results Review = values in this interval not displayed.                   Microbiology, reviewed and are significant for:  Microbiology Results (last 15 days)       Procedure Component Value Units Date/Time    COVID-19 and Influenza ([place]) (symptomatic) [[id number]]  (Abnormal) Collected: 10/09/23 1228    Order Status: Completed Specimen: Swab from Anterior Nares Updated: 10/09/23 1327     SARS-CoV-2 (COVID-19) RNA Not Detected     Influenza A RNA Detected     Influenza B RNA Not Detected    Narrative:      A result of Detected indicates POSITIVE for the presence of viral RNA  A result of Not Detected indicates NEGATIVE for the presence of viral RNA    Test performed using the Roche cobas [place] SARS-CoV-2 & Influenza A/B assay. This is a multiplex real-time RT-PCR assay for the detection of SARS-CoV-2, influenza A, and influenza B virus RNA. Viral nucleic acids may persist in vivo, independent of viability. Detection of viral nucleic acid does not imply the presence of infectious virus, or that virus nucleic acid is the cause of clinical symptoms. Negative results do not preclude SARS-CoV-2, influenza A, and/or influenza B infection and should not be used as the sole basis for diagnosis, treatment or other patient management decisions. Invalid results may be due to inhibiting substances in the specimen and recollection should occur.             Imaging, reviewed and are significant for:  XR Chest AP Portable    Result Date: 10/11/2023  1. Interval increase in left perihilar opacity, left-sided pleural effusion and associated left basilar atelectasis and/or pneumonia. 2. There has been no significant change in the appearance of the right  lung. 3. Reported bilateral pulmonary nodules on the CT scan are less well delineated on this study. Please refer to the CT scan for further delineation. Lindie Cunning, MD 10/11/2023 7:15 AM    CT Angio Chest (PE or Trauma Protocol)    Result Date: 10/09/2023   1. No CT evidence of pulmonary embolism. 2. Pleural-based mass and bilateral pulmonary nodules are increased in size 3. Interim insertion of left-sided chest tube with decreased loculated left-sided pleural effusion J. Charlie Hoe, MD 10/09/2023 2:46 PM         Signed by: Damian LELON Rim, MD         [1]   Current Facility-Administered Medications   Medication Dose Route Frequency Last Rate   [2]   Current Facility-Administered Medications   Medication Dose Route    acetaminophen   650 mg Oral    benzocaine -menthol   1 lozenge Buccal  benzonatate   100 mg Oral    carboxymethylcellulose sodium  1 drop Both Eyes    dextrose   15 g of glucose Oral    Or    dextrose   12.5 g Intravenous    Or    dextrose   12.5 g Intravenous    Or    glucagon  (rDNA)  1 mg Intramuscular    magnesium  sulfate  1 g Intravenous    melatonin  3 mg Oral    naloxone   0.2 mg Intravenous    potassium & sodium phosphates   2 packet Oral    potassium chloride   0-60 mEq Oral    Or    potassium chloride   0-60 mEq Oral    Or    potassium chloride   10 mEq Intravenous    saline  2 spray Each Nare

## 2023-10-13 NOTE — Nursing Progress Note (Addendum)
 Brief background: This patient was admitted due to shortness of breath, was found to be in CHF exacerbation with underlying atrial fibrillation with RVR.  Patient also tested positive for flu.     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    Last 24 Hrs: Weaned NC to 1L, ambulatory pulse ox done (see note)  Neb treatments as ordered      Abnormal labs/VS:     Recent Labs   Lab 10/12/23  0420   Sodium 138   Potassium 3.2*   Chloride 94*   CO2 36*   BUN 25   Creatinine 0.8   GFR >60.0   Glucose 231*   Calcium  10.6*       Recent Labs   Lab 10/10/23  0335   WBC 16.04*   Hemoglobin 12.9   Hematocrit 38.4   Platelet Count 234         Neuro changes: A&Ox4     Respiratory: 1 LNC    Tele/ Rhythm: afib     GI/GU: cont x2, Last BM Date: 10/12/23     Nutrition: Heart Healthy     Activity/ Safety/ Mobility: x1 w/ walker     Skin/ Wounds: Scattered bruising, small tear to left hand, Blanchable redness to sacrum, BLE Pitting edema, RLQ incision.     LDA   Patient Lines/Drains/Airways Status       Active Lines, Drains and Airways       Name Placement date Placement time Site Days    Peripheral IV 10/12/23 22 G Anterior;Distal;Right Upper Arm 10/12/23  1054  Upper Arm  1    Chest Tube Left Pleural 09/19/23  0000  Pleural  24                     VTE prophylaxis:     Current Facility-Administered Medications (Includes Only Anticoagulants, Misc. Hematological)   Medication Dose Route Last Admin   None        Upcoming Procedures: GoC    Discharge barriers:  Medical Clearance  Pending HHS set up     Discharge plan: Future hospice possible (per palli)    4 eyes in 4 hours pressure injury assessment note:      Completed with: Phevy, RN    Bony Prominences: Check appropriate box; if wound is present enter wound assessment in LDA     Occiput:                 [x] WNL  []  Wound present  Face:                      [x] WNL  []  Wound present  Ears:                      [x] WNL  []  Wound present  Spine:                    [x] WNL  []  Wound present  Shoulders:             [x] WNL  []  Wound present  Elbows:                  [x] WNL  []  Wound present  Sacrum/coccyx:     [x] WNL  []  Wound present  Ischial Tuberosity:  [x] WNL  []  Wound present  Trochanter/Hip:      [  x]WNL  []  Wound present  Knees:                   [x] WNL  []  Wound present  Ankles:                   [x] WNL  []  Wound present  Heels:                    [x] WNL  []  Wound present  Other pressure areas:  []  Wound location       Device related: []  Device name:         LDA completed if wound present: yes/no  Consult WOCN if necessary    Other skin related issues, ie tears, rash, etc, document in Integumentary flowsheet

## 2023-10-13 NOTE — Progress Notes (Signed)
 Pulse Oximetry Testing     Document patient's oxygen at rest :   93% on room air, at rest (No ranges please).       94% on oxygen, at rest on 2LPM via NC (document number of liters needed to maintain 89% or greater)         Next, test patient with ambulation (or exertion in bed IF bed bound) and document below:    89% on room air, with exertion (if 88% or below, provide additional O2 and see next step)    95% on oxygen, with exertion, at 2LPM via NC (document number of liters needed to maintain 89% or greater)

## 2023-10-14 DIAGNOSIS — I4891 Unspecified atrial fibrillation: Secondary | ICD-10-CM

## 2023-10-14 LAB — WHOLE BLOOD GLUCOSE POCT
Whole Blood Glucose POCT: 240 mg/dL — ABNORMAL HIGH (ref 70–100)
Whole Blood Glucose POCT: 277 mg/dL — ABNORMAL HIGH (ref 70–100)
Whole Blood Glucose POCT: 185 mg/dL — ABNORMAL HIGH (ref 70–100)
Whole Blood Glucose POCT: 209 mg/dL — ABNORMAL HIGH (ref 70–100)

## 2023-10-14 LAB — PT/INR
INR: 2.8 (ref 0.9–1.1)
PT: 31 s — ABNORMAL HIGH (ref 10.1–12.9)

## 2023-10-14 MED ORDER — WARFARIN SODIUM 2 MG PO TABS
1.0000 mg | ORAL_TABLET | Freq: Every day | ORAL | Status: DC
Start: 2023-10-14 — End: 2023-10-17
  Administered 2023-10-14 – 2023-10-16 (×3): 1 mg via ORAL
  Filled 2023-10-14 (×3): qty 1

## 2023-10-14 MED ORDER — WARFARIN PHARMACY TO DOSE PLACEHOLDER
ORAL | Status: DC
Start: 2023-10-14 — End: 2023-10-17

## 2023-10-14 MED ORDER — FUROSEMIDE 10 MG/ML IJ SOLN
80.0000 mg | Freq: Two times a day (BID) | INTRAMUSCULAR | Status: DC
Start: 2023-10-15 — End: 2023-10-16
  Administered 2023-10-15 (×2): 80 mg via INTRAVENOUS
  Filled 2023-10-14 (×2): qty 8

## 2023-10-14 NOTE — Plan of Care (Signed)
 Problem: Moderate/High Fall Risk Score >5  Goal: Patient will remain free of falls  Outcome: Progressing  Flowsheets (Taken 10/14/2023 1500)  Moderate Risk (6-13):   MOD-include family in multidisciplinary POC discussions   MOD-Request PT/OT consult order for patients with gait/mobility impairment   MOD-Perform dangle, stand, walk (DSW) prior to mobilization   MOD-Remain with patient during toileting   MOD-Floor mat at bedside (where available) if appropriate   MOD-Consider activation of bed alarm if appropriate     Problem: Compromised Moisture  Goal: Moisture level Interventions  Outcome: Progressing  Flowsheets (Taken 10/14/2023 1551)  Moisture level Interventions: Moisture wicking products, Moisture barrier cream     Problem: Compromised Activity/Mobility  Goal: Activity/Mobility Interventions  Outcome: Progressing  Flowsheets (Taken 10/14/2023 1551)  Activity/Mobility Interventions: Pad bony prominences, TAP Seated positioning system when OOB, Promote PMP, Reposition q 2 hrs / turn clock, Offload heels     Problem: Everyday - Heart Failure  Goal: Stable Vital Signs and Fluid Balance  Outcome: Progressing  Flowsheets (Taken 10/14/2023 1551)  Stable Vital Signs and Fluid Balance:   Monitor, assess vital signs and telemetry per policy   Fluid Restriction   Assess for swelling/edema   Wean oxygen as needed if appropriate   Monitor labs and report abnormalities to physician   Strict Intake/Output   Daily Standing Weights in the morning using the same scale, after using the bathroom and before breadfast.  If unable to stand, zero the bed and use the bed scale  Goal: Mobility/Activity is Maintained at Optimal Level for Patient  Outcome: Progressing  Flowsheets (Taken 10/14/2023 1551)  Mobility/Activity is Maintained at Optimal Level for Patient:   Increase mobility as tolerated/progressive mobility protocol   Maintain SCD's as Ordered   Perform active/passive ROM   Reposition patient every 2 hours and as needed unless able to  reposition self   Assess for changes in respiratory status, level of consciousness and/or development of fatigue   Consult with Physical Therapy and/or Occupational Therapy  Goal: Nutritional Intake is Adequate  Outcome: Progressing  Flowsheets (Taken 10/14/2023 1551)  Nutritional Intake is Adequate:   Fluid Restricction if needed   Consult/Collaborate with Nutritionist   Assess appetite,anorexia and amount of meal/food tolerated   Encourage/perform oral hygiene as appropriate  Goal: Teaching-Using CHF Warning Zones and Educational Videos  Outcome: Progressing  Flowsheets (Taken 10/14/2023 1551)  Teaching-Using CHF Warning Zones and Educational Videos:   Signs & Symptoms of CHF   Daily Standing Weights & record   CHF Warning Zones and when to call for help   Medications   Sodium Restriction   Fluid Restriction if appropriate   Document in the Education Tab in EPIC with Teach-back     Problem: Inadequate Gas Exchange  Goal: Adequate oxygenation and improved ventilation  Outcome: Progressing  Flowsheets (Taken 10/14/2023 1551)  Adequate oxygenation and improved ventilation:   Assess lung sounds   Monitor SpO2 and treat as needed   Monitor and treat ETCO2   Provide mechanical and oxygen support to facilitate gas exchange   Position for maximum ventilatory efficiency   Teach/reinforce use of incentive spirometer 10 times per hour while awake, cough and deep breath as needed   Plan activities to conserve energy: plan rest periods   Increase activity as tolerated/progressive mobility  Goal: Patent Airway maintained  Outcome: Progressing  Flowsheets (Taken 10/14/2023 1551)  Patent airway maintained:   Position patient for maximum ventilatory efficiency   Provide adequate fluid intake to liquefy secretions  Suction secretions as needed   Reinforce use of ordered respiratory interventions (i.e. CPAP, BiPAP, Incentive Spirometer, Acapella, etc.)   Reposition patient every 2 hours and as needed unless able to self-reposition      Problem: Compromised Sensory Perception  Goal: Sensory Perception Interventions  Outcome: Progressing  Flowsheets (Taken 10/14/2023 1551)  Sensory Perception Interventions: Offload heels, Pad bony prominences, Reposition q 2hrs/turn Clock, Q2 hour skin assessment under devices if present     Problem: Compromised Friction/Shear  Goal: Friction and Shear Interventions  Outcome: Progressing  Flowsheets (Taken 10/14/2023 1551)  Friction and Shear Interventions: Pad bony prominences, Off load heels, HOB 30 degrees or less unless contraindicated, Consider: TAP seated positioning, Heel foams     Problem: Safety  Goal: Patient will be free from injury during hospitalization  Outcome: Progressing  Flowsheets (Taken 10/14/2023 1551)  Patient will be free from injury during hospitalization:   Assess patient's risk for falls and implement fall prevention plan of care per policy   Provide and maintain safe environment   Ensure appropriate safety devices are available at the bedside   Hourly rounding   Include patient/ family/ care giver in decisions related to safety   Use appropriate transfer methods  Goal: Patient will be free from infection during hospitalization  Outcome: Progressing  Flowsheets (Taken 10/14/2023 1551)  Free from Infection during hospitalization:   Assess and monitor for signs and symptoms of infection   Monitor lab/diagnostic results   Monitor all insertion sites (i.e. indwelling lines, tubes, urinary catheters, and drains)   Encourage patient and family to use good hand hygiene technique     Problem: Psychosocial and Spiritual Needs  Goal: Demonstrates ability to cope with hospitalization/illness  Outcome: Progressing  Flowsheets (Taken 10/14/2023 1551)  Demonstrates ability to cope with hospitalizations/illness:   Encourage verbalization of feelings/concerns/expectations   Provide quiet environment   Assist patient to identify own strengths and abilities   Encourage patient to set small goals for self   Encourage  participation in diversional activity   Reinforce positive adaptation of new coping behaviors   Include patient/ patient care companion in decisions     Problem: Altered GI Function  Goal: Fluid and electrolyte balance are achieved/maintained  Outcome: Progressing  Flowsheets (Taken 10/14/2023 1551)  Fluid and electrolyte balance are achieved/maintained:   Monitor/assess lab values and report abnormal values   Observe for cardiac arrhythmias   Monitor for muscle weakness   Assess and reassess fluid and electrolyte status     Problem: Fluid and Electrolyte Imbalance/ Endocrine  Goal: Fluid and electrolyte balance are achieved/maintained  Outcome: Progressing  Flowsheets (Taken 10/14/2023 1551)  Fluid and electrolyte balance are achieved/maintained:   Monitor/assess lab values and report abnormal values   Observe for cardiac arrhythmias   Monitor for muscle weakness   Assess and reassess fluid and electrolyte status  Goal: Adequate hydration  Outcome: Progressing  Flowsheets (Taken 10/14/2023 1551)  Adequate hydration:   Assess mucus membranes, skin color, turgor, perfusion and presence of edema   Assess for peripheral, sacral, periorbital and abdominal edema   Monitor and assess vital signs and perfusion     Problem: Nutrition  Goal: Nutritional intake is adequate  Outcome: Progressing  Flowsheets (Taken 10/14/2023 1551)  Nutritional intake is adequate: Consult/collaborate with Clinical Nutritionist     Problem: Diabetes: Glucose Imbalance  Goal: Blood glucose stable at established goal  Outcome: Progressing  Flowsheets (Taken 10/14/2023 1551)  Blood glucose stable at established goal:   Monitor lab  values   Monitor intake and output.  Notify LIP if urine output is < 30 mL/hour.   Follow fluid restrictions/IV/PO parameters   Assess for hypoglycemia /hyperglycemia   Coordinate medication administration with meals, as indicated   Ensure adequate hydration   Ensure appropriate diet and assess tolerance   Monitor/assess vital  signs   Include patient/family in decisions related to nutrition/dietary selections   Ensure appropriate consults are obtained (Nutrition, Diabetes Education, and Case Management)   Ensure patient/family has adequate teaching materials     Problem: Hemodynamic Status: Cardiac  Goal: Stable vital signs and fluid balance  Outcome: Progressing  Flowsheets (Taken 10/14/2023 1551)  Stable vital signs and fluid balance:   Assess signs and symptoms associated with cardiac rhythm changes   Monitor lab values     Problem: Inadequate Tissue Perfusion  Goal: Adequate tissue perfusion will be maintained  Outcome: Progressing  Flowsheets (Taken 10/14/2023 1551)  Adequate tissue perfusion will be maintained:   Monitor/assess lab values and report abnormal values   Monitor/assess neurovascular status (pulses, capillary refill, pain, paresthesia, paralysis, presence of edema)   Monitor/assess for signs of VTE (edema of calf/thigh redness, pain)   Monitor for signs and symptoms of a pulmonary embolism (dyspnea, tachypnea, tachycardia, confusion)   Encourage/assist patient as needed to turn, cough, and perform deep breathing every 2 hours     Problem: Ineffective Gas Exchange  Goal: Effective breathing pattern  Outcome: Progressing  Flowsheets (Taken 10/14/2023 1551)  Effective breathing pattern:   Maintain CO2 level per LIP order   Monitor end tidal CO2 level per LIP order   Teach/reinforce use of ordered respiratory interventions (ie. CPAP, BiPAP, Incentive Spirometer, Acapella)     Problem: Impaired Mobility  Goal: Mobility/Activity is maintained at optimal level for patient  Outcome: Progressing  Flowsheets (Taken 10/14/2023 1551)  Mobility/activity is maintained at optimal level for patient:   Encourage independent activity per ability   Consult/collaborate with Physical Therapy and/or Occupational Therapy     Problem: Peripheral Neurovascular Impairment  Goal: Extremity color, movement, sensation are maintained or improved  Outcome:  Progressing  Flowsheets (Taken 10/14/2023 1551)  Extremity color, movement, sensation are maintained or improved: Assess extremity for proper alignment     Problem: Compromised skin integrity  Goal: Skin integrity is maintained or improved  Outcome: Progressing  Flowsheets (Taken 10/14/2023 1551)  Skin integrity is maintained or improved:   Assess Braden Scale every shift   Turn or reposition patient every 2 hours or as needed unless able to reposition self   Increase activity as tolerated/progressive mobility   Relieve pressure to bony prominences   Avoid shearing   Keep skin clean and dry   Monitor patient's hygiene practices   Collaborate with Wound, Ostomy, and Continence Nurse   Utilize specialty bed   Keep head of bed 30 degrees or less (unless contraindicated)     Problem: Nutrition  Goal: Nutritional intake is adequate  Outcome: Progressing  Flowsheets (Taken 10/14/2023 1551)  Nutritional intake is adequate: Consult/collaborate with Clinical Nutritionist     Problem: Droplet Isolation  Goal: Prevent transmission of other diagnosed infection while caring for patient in Droplet isolation  Outcome: Progressing  Flowsheets (Taken 10/14/2023 1551)  Is appropriate isolation sign and PPE outside Patient Room?: Yes  Provided Patient and family education: Verbal education  Strict Hand Hygiene: Yes  Donning and doffing PPE properly both Staff and Visitors: Yes  Multi-dose Medications stored in plastic bags?: Yes

## 2023-10-14 NOTE — Progress Notes (Signed)
 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  Patient also was found to have the flu. Otherwise patient is resting comforably on 2L o2.     Plan:       #Acute on chronic diastolic heart failure:   -Continue home thiazide-substitute HCTZ for his chlorthalidone   -Continue Lasix  40 mg IV twice daily  -daily weights, I/O  -long term telemetry  -     #SOB  --remains short of breath, this is likely from tumor burden  however could possibly be from afib?  --consulted cadiology   --Cont continue to diurese as above  -- Continue to wean oxygen as tolerated  -- Discussed with IR for troubleshoot Pleurx as has not been drained for 4 days  -- Messaged oncology given findings on CT scan that shows mass in the lung.  --Liquid Morphine  2mg , PRN for Shortness of breath  ==f/u SLP    #Atrial fibrillation with RVR  -resume home metoprolol  100mg  daily  -should be safe for diltiazem  if this is needed  -Long term telemetry as above  -- Continue dilt to 60mg  TID   --Cadiology consulted.   -takes coumadin -unclear why this medication but long standing, managing as below     #Coumadin  management:    f/u INR 4.8 cont to hold coumadin     -Continue tamiflu      #)Leukocytosis:    --Likely secondary malignancy  --Continue monitor     #Loculated left pleural effusion with PleurX placement:  malignant, drained every 3 days,   --Drainage today    #HTN:  -continue metoprolol  as above  -continue thiazide as above  -continue imdur  30mg  daily     #Gout:  chronic without acute flare  -continue home allopurinol  300mg  daily    #GOC:    Had lenghty discussion with patient and son regarding symptoms.Explained that the symptoms is most likely due to progression of disease particularly the mass in his lung.Consulted cardiology to rule out cardiac involvement and consulted palliative care  --Had a lengthy discussion with family at bedside.  Noted the symptoms are likely due to psychosomatic reasons such as anxiety.  We discussed how palliative care could provide ways to alleviate some of the symptoms.  --f/u Palliative care.        #Oropharyngeal cancer:  he is to start chemotherapy and immunotherapy in the coming weeks; has not had any treatments thus far     Chemical DVT ppx  FULL code  Telemetry needs:  long term as above  Case discussed with: patient     Safety Checklist:     DVT prophylaxis:  CHEST guideline (See page [id number]) Chemical   Foley:  [place] Rn Foley protocol Not present   IVs:  Peripheral IV   PT/OT: Ordered   Daily CBC & or Chem ordered:  SHM/ABIM guidelines (see #5) Yes, due to clinical and lab instability   Reference for approximate charges of common labs: CBC auto diff - $76  BMP - $99  Mg - $79    Lines:     Patient Lines/Drains/Airways Status       Active PICC  Line / CVC Line / PIV Line / Drain / Airway / Intraosseous Line / Epidural Line / ART Line / Line / Wound / Pressure Ulcer / NG/OG Tube       Name Placement date Placement time Site Days    Peripheral IV 10/09/23 18 G Standard Right Antecubital 10/09/23  1222  Antecubital  less than 1                     Disposition: (Please see PAF column for Expected D/C Date)   Today's date: 10/14/2023  Admit Date: 10/09/2023 11:58 AM  LOS: 5  Clinical Milestones: Afiv RVR   Anticipated discharge ne:       Subjective     CC: 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  Feeling better today  NASD  NAEON  Rest of ROS same as hPI     Liquid morphine  helped.     Review of Systems:     As per HPI    Physical Exam:     VITAL SIGNS PHYSICAL EXAM   Temp:  [97.3 F (36.3 C)-97.7 F  (36.5 C)] 97.5 F (36.4 C)  Heart Rate:  [78-112] 94  Resp Rate:  [16-20] 17  BP: (105-124)/(60-79) 122/69  Blood Glucose:        Intake/Output Summary (Last 24 hours) at 10/14/2023 0844  Last data filed at 10/14/2023 0605  Gross per 24 hour   Intake 504 ml   Output 300 ml   Net 204 ml    Physical Exam  General: awake, alert X 3  Cardiovascular: regular rate and rhythm, no murmurs, rubs or gallops  Lungs: clear to auscultation bilaterally, without wheezing, rhonchi, or rales  Abdomen: soft, non-tender, non-distended; no palpable masses,  normoactive bowel sounds  Extremities: 2+ edema         Meds:     Medications were reviewed:  Current Facility-Administered Medications   Medication Dose Route Frequency    albuterol -ipratropium  3 mL Nebulization Once    albuterol -ipratropium  3 mL Nebulization Q6H SCH    allopurinol   300 mg Oral Daily    atorvastatin   40 mg Oral Daily    budesonide -formoterol   2 puff Inhalation BID    dilTIAZem   60 mg Oral 4 times per day    furosemide   40 mg Intravenous BID    hydroCHLOROthiazide   25 mg Oral Daily    insulin  lispro  1-4 Units Subcutaneous QHS    insulin  lispro  1-8 Units Subcutaneous TID AC    isosorbide  mononitrate  30 mg Oral Daily    metoprolol  succinate  100 mg Oral Daily    senna  8.6 mg Oral QHS    warfarin   Oral See Admin Instructions     Infusion Meds[1]  PRN Medications[2]      Labs:     Labs (last 72 hours):    Recent Labs   Lab 10/10/23  0335 10/09/23  1225   WBC 16.04* 16.71*   Hemoglobin 12.9 12.8   Hematocrit 38.4 38.9   Platelet Count 234 271       Recent Labs   Lab 10/14/23  0318 10/13/23  0430   PT 31.0* 48.2*   INR 2.8 4.3    Recent Labs   Lab 10/13/23  1755 10/12/23  0420 10/10/23  0335 10/09/23  1225   Sodium 137 138  More results in Results Review 140   Potassium 3.4* 3.2*  More results in Results Review  3.5   Chloride 88* 94*  More results in Results Review 97*   CO2 35* 36*  More results in Results Review 34*   BUN 24 25  More results in Results Review 25    Creatinine 0.9 0.8  More results in Results Review 0.8   Calcium  10.8* 10.6*  More results in Results Review 10.9*   Albumin  1.9*  --   --  2.0*   Protein, Total 5.8*  --   --  6.0   Bilirubin, Total 0.6  --   --  0.5   Alkaline Phosphatase 156*  --   --  158*   ALT 25  --   --  27   AST (SGOT) 36  --   --  36   Glucose 138* 231*  More results in Results Review 136*   More results in Results Review = values in this interval not displayed.                   Microbiology, reviewed and are significant for:  Microbiology Results (last 15 days)       Procedure Component Value Units Date/Time    COVID-19 and Influenza ([place]) (symptomatic) [[id number]]  (Abnormal) Collected: 10/09/23 1228    Order Status: Completed Specimen: Swab from Anterior Nares Updated: 10/09/23 1327     SARS-CoV-2 (COVID-19) RNA Not Detected     Influenza A RNA Detected     Influenza B RNA Not Detected    Narrative:      A result of Detected indicates POSITIVE for the presence of viral RNA  A result of Not Detected indicates NEGATIVE for the presence of viral RNA    Test performed using the Roche cobas [place] SARS-CoV-2 & Influenza A/B assay. This is a multiplex real-time RT-PCR assay for the detection of SARS-CoV-2, influenza A, and influenza B virus RNA. Viral nucleic acids may persist in vivo, independent of viability. Detection of viral nucleic acid does not imply the presence of infectious virus, or that virus nucleic acid is the cause of clinical symptoms. Negative results do not preclude SARS-CoV-2, influenza A, and/or influenza B infection and should not be used as the sole basis for diagnosis, treatment or other patient management decisions. Invalid results may be due to inhibiting substances in the specimen and recollection should occur.             Imaging, reviewed and are significant for:  XR Chest AP Portable    Result Date: 10/11/2023  1. Interval increase in left perihilar opacity, left-sided pleural effusion and associated left  basilar atelectasis and/or pneumonia. 2. There has been no significant change in the appearance of the right lung. 3. Reported bilateral pulmonary nodules on the CT scan are less well delineated on this study. Please refer to the CT scan for further delineation. Lindie Cunning, MD 10/11/2023 7:15 AM    CT Angio Chest (PE or Trauma Protocol)    Result Date: 10/09/2023   1. No CT evidence of pulmonary embolism. 2. Pleural-based mass and bilateral pulmonary nodules are increased in size 3. Interim insertion of left-sided chest tube with decreased loculated left-sided pleural effusion J. Charlie Hoe, MD 10/09/2023 2:46 PM         Signed by: Damian LELON Rim, MD         [1]   Current Facility-Administered Medications   Medication Dose Route Frequency Last Rate   [2]   Current Facility-Administered Medications   Medication Dose Route  acetaminophen   650 mg Oral    benzocaine -menthol   1 lozenge Buccal    benzonatate   100 mg Oral    carboxymethylcellulose sodium  1 drop Both Eyes    dextrose   15 g of glucose Oral    Or    dextrose   12.5 g Intravenous    Or    dextrose   12.5 g Intravenous    Or    glucagon  (rDNA)  1 mg Intramuscular    magnesium  sulfate  1 g Intravenous    melatonin  3 mg Oral    morphine  (concentrate)  2 mg Oral    naloxone   0.2 mg Intravenous    potassium & sodium phosphates   2 packet Oral    potassium chloride   0-60 mEq Oral    Or    potassium chloride   0-60 mEq Oral    Or    potassium chloride   10 mEq Intravenous    saline  2 spray Each Nare    senna  8.6 mg Oral

## 2023-10-14 NOTE — Nursing Progress Note (Addendum)
 Brief background: This patient was admitted due to shortness of breath, was found to be in CHF exacerbation with underlying atrial fibrillation with RVR.  Patient also tested positive for flu.     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     Last 24 Hrs:   *PO Morphine  solution given PRN for pain and air hunger  *Nebs as ordered  *60 mEq potassium given       Abnormal labs/VS:     Recent Labs   Lab 10/13/23  1755   Sodium 137   Potassium 3.4*   Chloride 88*   CO2 35*   BUN 24   Creatinine 0.9   GFR >60.0   Glucose 138*   Calcium  10.8*       Recent Labs   Lab 10/10/23  0335   WBC 16.04*   Hemoglobin 12.9   Hematocrit 38.4   Platelet Count 234         Neuro changes: A&Ox4     Respiratory: 1L NC    Tele/ Rhythm: afib on tele     GI/GU: continent x2, Last BM Date: 10/14/23     Nutrition: Heart Healthy, 1.8L fluid restriction     Activity/ Safety/ Mobility: x1 w/ walker     Skin/ Wounds: Scattered bruising, small tear to left hand, Blanchable redness to sacrum, BLE Pitting edema, RLQ incision.     LDA   Patient Lines/Drains/Airways Status       Active Lines, Drains and Airways       Name Placement date Placement time Site Days    Peripheral IV 10/12/23 22 G Anterior;Distal;Right Upper Arm 10/12/23  1054  Upper Arm  2    Chest Tube Left Pleural 09/19/23  0000  Pleural  25                     VTE prophylaxis:     Current Facility-Administered Medications (Includes Only Anticoagulants, Misc. Hematological)   Medication Dose Route Last Admin    warfarin (COUMADIN ) tablet 1 mg  1 mg Oral 1 mg at 10/14/23 1726    warfarin (COUMADIN ) therapy dosed by pharmacy   Oral          Upcoming Procedures: GoC     Discharge barriers:  Medical Clearance  Pending HHS set up     Discharge plan: Future hospice possible (per palli)

## 2023-10-14 NOTE — Progress Notes (Deleted)
 CM consulted with patient's family per request regarding hospice choices. CM explained GIP, criteria, and transfer if applicable should the patient qualify for GIP. Son pre-emptively reported to CM that patient could not go home as his care exceeds family's ability. CM informed family that patient could go to SNF with Medicare paying for hospice services and room and board is private pay. CM reported under Medicare guideline patient could go to SNF with Palliative treatment and Medicare would be payer source for that stay including room and board. Family verbalized understanding.    Latia Mataya, BSN, ACM-RN, CCM, [facility]  Case Management Department  [facility]  Preferred method of contact: Secure Chat  [place] Ext: [phone]

## 2023-10-14 NOTE — Progress Notes (Addendum)
 [facility] PROGRESS NOTE     Date Time:    10/14/23    2:18 PM  Patient Name: Carl [name]    LOS: 5 days     Subjective:  No cardiac events.   Still feels short of breath, family at bedside     Scheduled Meds:   albuterol -ipratropium, 3 mL, Nebulization, Once  albuterol -ipratropium, 3 mL, Nebulization, Q6H SCH  allopurinol , 300 mg, Oral, Daily  atorvastatin , 40 mg, Oral, Daily  budesonide -formoterol , 2 puff, Inhalation, BID  dilTIAZem , 60 mg, Oral, 4 times per day  furosemide , 40 mg, Intravenous, BID  hydroCHLOROthiazide , 25 mg, Oral, Daily  insulin  lispro, 1-4 Units, Subcutaneous, QHS  insulin  lispro, 1-8 Units, Subcutaneous, TID AC  isosorbide  mononitrate, 30 mg, Oral, Daily  metoprolol  succinate, 100 mg, Oral, Daily  senna, 8.6 mg, Oral, QHS  warfarin, 1 mg, Oral, Daily at 1800  warfarin, , Oral, See Admin Instructions     Continuous Infusions:   PRN Meds: acetaminophen , benzocaine -menthol , benzonatate , carboxymethylcellulose sodium, dextrose  **OR** dextrose  **OR** dextrose  **OR** glucagon  (rDNA), magnesium  sulfate, melatonin, morphine  (concentrate), naloxone , potassium & sodium phosphates , potassium chloride  **OR** potassium chloride  **OR** potassium chloride , saline, senna    Objective:  Physical Exam:  BP 102/65   Pulse 99   Temp 97.7 F (36.5 C) (Oral)   Resp 17   Ht 1.651 m (5' 5)   Wt 76.5 kg (168 lb 11.2 oz)   SpO2 93%   BMI 28.07 kg/m      Intake/Output Summary (Last 24 hours) at 10/14/2023 0700  Last data filed at 10/14/2023 9394  Gross per 24 hour   Intake 504 ml   Output 300 ml   Net 204 ml     Wt Readings from Last 1 Encounters:   10/14/23 0500 76.5 kg (168 lb 11.2 oz)   10/13/23 0846 76.6 kg (168 lb 12.8 oz)   10/12/23 0300 78.7 kg (173 lb 9.6 oz)   10/11/23 1700 77.1 kg (169 lb 15.6 oz)   10/11/23 0307 77.1 kg (169 lb 15.6 oz)   10/10/23 1800 77.1 kg (169 lb 15.6 oz)   10/10/23 0500 77.1 kg (169 lb 15.6 oz)   10/09/23 2330 81.2 kg (179 lb)   10/09/23 1146 81.2 kg (179 lb)      General: awake, alert, oriented x 3, no acute distress.  Cardiovascular: irregular rate and rhythm no murmurs, rubs or gallops     Lungs: rales present   Abdomen: soft, non-tender, non-distended, no hepatosplenomegaly, normoactive bowel sounds  Extremities: no clubbing, cyanosis, or edema     Lab Review:   No results for input(s): WBC, HGB, HCT, PLT in the last 72 hours.  No results for input(s): TROPI, ISTATTROPONI, CK, CKMB, BNP, DDIMER in the last 72 hours. Recent Labs     10/13/23  1755 10/12/23  0420   Sodium 137 138   Potassium 3.4* 3.2*   CO2 35* 36*   BUN 24 25   Creatinine 0.9 0.8   Glucose 138* 231*   Magnesium   --  1.7    Estimated Creatinine Clearance: 60.4 mL/min (based on SCr of 0.9 mg/dL).   Recent Labs     10/14/23  0318 10/13/23  0430 10/12/23  0420   INR 2.8 4.3 4.8        Telemetry: afib      Imaging:  Radiology Results (24 Hour)       ** No results found for the last 24 hours. **  Assessment/Plan:   83 y.o. male with a PMHx HTN, HLD, DM type II, CAD s/p CABG, Afib (on coumadin ), HFpEF, OSA, COPD, ex-smoker, pleural effusions s/p thoracentesis and PleurX now removed, with positive pleural fluid for CEA positive adenocarcinoma, SCC of the lung via biopsy now readmitted with shortness of breath found to be flu + with afib and CHF.     #HFpEF   ProBNP 2400 on admission, negative trop. Most likely multifactorial from afib and flu infection. Agree with continued diuresis, but will increase to 80 mg IV BID from 40mg . Will also encourage aggressive electrolyte repletion. Patient has hx of gout, will monitor for sx.   - increase lasix  80 mg IV BID   - electrolyte repletion   - jardiance  before discharge   - continue antihypertensive medications     #afib   Known hx of afib, will continue BB and warfarin. INR at goal today. Given active infection and heart failure exacerbation, will refrain from DCCV until both issues are treated.   - metoprolol  100 mg daily   -  coumadin         Signed by: Arleene LOISE Forts, DO MA  [facility]     [facility]   [facility] NP Spectralink [phone] or [phone] (M-F 8 am-5 pm)   [facility] Spectralink [phone]  (M-F 8 am-5 pm)  After Hours On-Call Physician: [phone]

## 2023-10-14 NOTE — Progress Notes (Addendum)
 Brief background:  83 y/o M, presented with SOB was found to be in CHF exacerbation with underlying A-fib with RVR. Patient also was found to have the flu.   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     Last 24 Hrs: Pt with increased work of breathing, PRN morphine  2mg  oral solution given to pt. Pt denies chest pain, HA/dizziness or N/V. Schedule medications given as order. Safety & fall precautions in placed. Call bell within pt reach. Purposeful rounding done. Isolation precautions maintained.     LDA (drains, central lines, foley, & the indication): Pleurex cath in place, 22G RUA     Discharge barriers: Medical clearance     Abnormal labs/VS (needing any orders): PT-31.0, INR-2.8    Respiratory (changes in oxygenation): Received pt on 1L NC, pt desats to 89%. SOB reported with increase work of breathing. O2 titrated up to 2L.     GI/GU (changes in output, BM, foley): cont x 2. Last BM 2/5, senna docusate given to pt.     Neuro changes: AxOx4     Nutrition: Heart Healthy diet     Activity/ Safety/ Mobility questions (need for PT/OT, sitter): x1 person assist & a walker. Provided pt & his son education on safety & fall precautions, both verbalized understanding.     Tele/ Rhythm (changes?): Afib in tele     Skin/ Wounds: Scattered (bruising, scaling & scars),  Abrasions(BUE/hands), Cracking(BLE, heels), Blanchable redness(sacrum,buttocks, gluteal cleft, back, b/l plantar feet & heels), +3 pitting edema on both feet  VTE prophylaxis: on hold, Target goal for PT INR is less than 2. Ambulation.     Upcoming Procedures: TBD     Case management needs: TBD     Discharge plan: TBD    Plans: *Continue IV lasix . Strict I's & O's, daily weights              * Neb treatment              * Tele monitoring              *Onco & cardiology following              *Thoracic surgery recommend decreasing pleurx drainage to once a week (due next  Wednesday as there was an attempt at drainage this AM)               *Continue to wean oxygen as tolerated               *to start chemotherapy and immunotherapy in the coming weeks              *follow up with Dr. Maree on 2/27 to discuss possible pleurx removal if drainage remains low

## 2023-10-14 NOTE — Progress Notes (Signed)
 WARFARIN MANAGEMENT    Carl [name] is a 83 y.o. male presenting with PMH significant for  metastatic oropharyngeal carcinoma,COPD, atrial fibrillation (coumadin ), HFpEF, OSA, HTN and diabetes. He presents to the hospital 2/2 to rapid atrial fibrillation and decompensated heart failure .     Indication: stroke prevention secondary to atrial fibrillation   INR goal: 2 - 3    This is a continuation of home warfarin.  Warfarin counseling is deferred at this time as it is a home medication. Pharmacy will counsel if requested.  Warfarin dose PTA: 2.5mg  QD   Outpatient warfarin management team: [facility] clinic     Recent Labs     10/14/23  0318 10/13/23  0430 10/12/23  0420   INR 2.8 4.3 4.8     Recent Labs     10/13/23  1755 10/12/23  0420   Creatinine 0.9 0.8   BUN 24 25   Albumin  1.9*  --    AST (SGOT) 36  --    ALT 25  --    Bilirubin, Total 0.6  --      Estimated Creatinine Clearance: 60.4 mL/min (based on SCr of 0.9 mg/dL).    ASSESSMENT   INR is within goal at 2.8. The INR has decreased 1.5 from yesterday's morning draw.  The patient is not indicated for bridging.  Potential drug-drug interactions include: Allopurinol  (increase INR),     PLAN   Recommend to Start warfarin 1 mg.  The next INR level is ordered to be collected with AM labs.    Adult Inpatient Warfarin Dosing Guideline

## 2023-10-15 LAB — PT/INR
INR: 2.4 (ref 0.9–1.1)
PT: 27 s — ABNORMAL HIGH (ref 10.1–12.9)

## 2023-10-15 LAB — BASIC METABOLIC PANEL
Anion Gap: 10 (ref 5.0–15.0)
Anion Gap: 11 (ref 5.0–15.0)
BUN: 27 mg/dL (ref 9–28)
BUN: 26 mg/dL (ref 9–28)
CO2: 36 meq/L — ABNORMAL HIGH (ref 17–29)
CO2: 34 meq/L — ABNORMAL HIGH (ref 17–29)
Calcium: 10.7 mg/dL — ABNORMAL HIGH (ref 7.9–10.2)
Calcium: 10.6 mg/dL — ABNORMAL HIGH (ref 7.9–10.2)
Chloride: 86 meq/L — ABNORMAL LOW (ref 99–111)
Chloride: 88 meq/L — ABNORMAL LOW (ref 99–111)
Creatinine: 0.9 mg/dL (ref 0.5–1.5)
Creatinine: 0.9 mg/dL (ref 0.5–1.5)
GFR: >60 mL/min/{1.73_m2} (ref >=60.0)
GFR: >60 mL/min/{1.73_m2} (ref >=60.0)
Glucose: 306 mg/dL — ABNORMAL HIGH (ref 70–100)
Glucose: 284 mg/dL — ABNORMAL HIGH (ref 70–100)
Potassium: 3.6 meq/L (ref 3.5–5.3)
Potassium: 3.5 meq/L (ref 3.5–5.3)
Sodium: 132 meq/L — ABNORMAL LOW (ref 135–145)
Sodium: 133 meq/L — ABNORMAL LOW (ref 135–145)

## 2023-10-15 LAB — WHOLE BLOOD GLUCOSE POCT
Whole Blood Glucose POCT: 196 mg/dL — ABNORMAL HIGH (ref 70–100)
Whole Blood Glucose POCT: 338 mg/dL — ABNORMAL HIGH (ref 70–100)
Whole Blood Glucose POCT: 232 mg/dL — ABNORMAL HIGH (ref 70–100)
Whole Blood Glucose POCT: 209 mg/dL — ABNORMAL HIGH (ref 70–100)

## 2023-10-15 NOTE — Progress Notes (Signed)
 WARFARIN MANAGEMENT    Carl [name] is a 83 y.o. male presenting with PMH significant for  metastatic oropharyngeal carcinoma,COPD, atrial fibrillation (coumadin ), HFpEF, OSA, HTN and diabetes. He presents to the hospital 2/2 to rapid atrial fibrillation and decompensated heart failure .     Indication: stroke prevention secondary to atrial fibrillation   INR goal: 2 - 3    This is a continuation of home warfarin.  Warfarin counseling is deferred at this time as it is a home medication. Pharmacy will counsel if requested.  Warfarin dose PTA: 2.5mg  QD   Outpatient warfarin management team: [facility] clinic     Recent Labs     10/15/23  0341 10/14/23  0318 10/13/23  0430   INR 2.4 2.8 4.3     Recent Labs     10/13/23  1755   Creatinine 0.9   BUN 24   Albumin  1.9*   AST (SGOT) 36   ALT 25   Bilirubin, Total 0.6     Estimated Creatinine Clearance: 61.1 mL/min (based on SCr of 0.9 mg/dL).    ASSESSMENT   INR is within goal at 2.8. The INR has decreased from yesterday's morning draw.  The patient is not indicated for bridging.  Potential drug-drug interactions include: Allopurinol  (increase INR),     PLAN   Recommend to continue warfarin 1 mg.  The next INR level is ordered to be collected with AM labs.    Adult Inpatient Warfarin Dosing Guideline

## 2023-10-15 NOTE — Nursing Progress Note (Signed)
 Brief background: This patient was admitted due to shortness of breath, was found to be in CHF exacerbation with underlying atrial fibrillation with RVR.  Patient also tested positive for flu.     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     Last 24 Hrs:   *PO Morphine  solution given PRN for pain and air hunger  *Nebs as ordered  *40 mEq potassium given to continue electrolyte correction.  *SLP eval done.  Recommend FEEs.     Abnormal labs/VS:     Recent Labs   Lab 10/15/23  1344   Sodium 133*   Potassium 3.5   Chloride 88*   CO2 34*   BUN 26   Creatinine 0.9   GFR >60.0   Glucose 284*   Calcium  10.6*       Recent Labs   Lab 10/10/23  0335   WBC 16.04*   Hemoglobin 12.9   Hematocrit 38.4   Platelet Count 234         Neuro changes: A&Ox4     Respiratory: 2L NC  Tele/ Rhythm: afib on tele     GI/GU: continent x2, Last BM Date: 10/14/23     Nutrition: Heart Healthy, 1.8L fluid restriction     Activity/ Safety/ Mobility: x1 w/ walker     Skin/ Wounds: Scattered bruising, small tear to left hand, Blanchable redness to sacrum, BLE Pitting edema, RLQ incision.     LDA   Patient Lines/Drains/Airways Status       Active Lines, Drains and Airways       Name Placement date Placement time Site Days    Peripheral IV 10/12/23 22 G Anterior;Distal;Right Upper Arm 10/12/23  1054  Upper Arm  3    Chest Tube Left Pleural 09/19/23  0000  Pleural  26                     VTE prophylaxis:     Current Facility-Administered Medications (Includes Only Anticoagulants, Misc. Hematological)   Medication Dose Route Last Admin    warfarin (COUMADIN ) tablet 1 mg  1 mg Oral 1 mg at 10/15/23 1703    warfarin (COUMADIN ) therapy dosed by pharmacy   Oral          Upcoming Procedures: Possible FEES    Discharge barriers: TBD      Discharge plan: TBD

## 2023-10-15 NOTE — Progress Notes (Signed)
 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  Patient also was found to have the flu. Otherwise patient is resting comforably on 2L o2.     Plan:       #Acute on chronic diastolic heart failure:   -Continue home thiazide-substitute HCTZ for his chlorthalidone   -incerased lasix  to 80 BID yesterday with some improvement. Wil lincrease to 120mg  if Cr is ok. Waiting on BMP from today.   -daily weights, I/O  -long term telemetry  -     #SOB  --remains short of breath, this is likely from tumor burden  however could possibly be from afib?  --consulted cadiology   --Cont continue to diurese as above  -- Continue to wean oxygen as tolerated  -- Discussed with IR for troubleshoot Pleurx as has not been drained for 4 days  -- Messaged oncology given findings on CT scan that shows mass in the lung.  --Liquid Morphine  2mg , PRN for Shortness of breath - this seems to help significantly over night.   ==f/u SLP    #Atrial fibrillation with RVR  -resume home metoprolol  100mg  daily  -should be safe for diltiazem  if this is needed  -Long term telemetry as above  -- Continue dilt to 60mg  TID   --Cadiology consulted.   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  Noted the symptoms are likely due to psychosomatic reasons such as anxiety.  We discussed how palliative care could provide ways to alleviate some of the symptoms.  --f/u Palliative care.        #Oropharyngeal cancer:  he is to start chemotherapy and immunotherapy in the coming weeks; has not had any treatments thus far     Chemical DVT ppx  FULL code  Telemetry needs:  long term as above  Case discussed with: patient     Safety Checklist:     DVT prophylaxis:  CHEST guideline (See page [id number]) Chemical   Foley:  [place] Rn Foley protocol Not present   IVs:  Peripheral IV   PT/OT: Ordered   Daily CBC & or Chem ordered:  SHM/ABIM guidelines (see #5) Yes, due to clinical and lab instability   Reference for approximate charges of common labs: CBC auto diff - $76  BMP - $  99  Mg - $79    Lines:     Patient Lines/Drains/Airways Status       Active PICC Line / CVC Line / PIV Line / Drain / Airway / Intraosseous Line / Epidural Line / ART Line / Line / Wound / Pressure Ulcer / NG/OG Tube       Name Placement date Placement time Site Days    Peripheral IV 10/09/23 18 G Standard Right Antecubital 10/09/23  1222  Antecubital  less than 1                     Disposition: (Please see PAF column for Expected D/C Date)   Today's date: 10/15/2023  Admit Date: 10/09/2023 11:58 AM  LOS: 6  Clinical Milestones: Afiv RVR   Anticipated discharge ne:       Subjective     CC: 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  Feeling better  NAD  NAEON  Rest of ROS same as HPI     Review of Systems:      As per HPI    Physical Exam:     VITAL SIGNS PHYSICAL EXAM   Temp:  [97.3 F (36.3 C)-97.7 F (36.5 C)] 97.5 F (36.4 C)  Heart Rate:  [49-108] 86  Resp Rate:  [16-24] 17  BP: (102-142)/(56-78) 142/77  Blood Glucose:        Intake/Output Summary (Last 24 hours) at 10/15/2023 0900  Last data filed at 10/15/2023 0528  Gross per 24 hour   Intake 804 ml   Output 2240 ml   Net -1436 ml    Physical Exam  General:Aaox3,NAD   Cardiovascular: regular rate and rhythm, no murmurs, rubs or gallops  Lungs: Decreased breath sounds on theright lower field.   Abdomen: soft, non-tender, non-distended; no palpable masses,  normoactive bowel sounds  Extremities: 2+ edema         Meds:     Medications were reviewed:  Current Facility-Administered Medications   Medication Dose Route Frequency    albuterol -ipratropium  3 mL Nebulization Once    albuterol -ipratropium  3 mL Nebulization Q6H SCH    allopurinol   300 mg Oral Daily    atorvastatin   40 mg Oral Daily    budesonide -formoterol   2 puff Inhalation BID    dilTIAZem   60 mg Oral 4 times per day    furosemide   80 mg Intravenous BID    hydroCHLOROthiazide   25 mg Oral Daily    insulin  lispro  1-4 Units Subcutaneous QHS    insulin  lispro  1-8 Units Subcutaneous TID AC    isosorbide  mononitrate  30 mg Oral Daily    metoprolol  succinate  100 mg Oral Daily    senna  8.6 mg Oral QHS    warfarin  1 mg Oral Daily at 1800    warfarin   Oral See Admin Instructions     Infusion Meds[1]  PRN Medications[2]      Labs:     Labs (last 72 hours):    Recent Labs   Lab 10/10/23  0335 10/09/23  1225   WBC 16.04* 16.71*   Hemoglobin 12.9 12.8   Hematocrit 38.4 38.9   Platelet Count 234 271       Recent Labs   Lab 10/15/23  0341 10/14/23  0318   PT 27.0* 31.0*   INR 2.4 2.8    Recent Labs   Lab 10/13/23  1755 10/12/23  0420 10/10/23  0335 10/09/23  1225  Sodium 137 138  More results in Results Review 140   Potassium 3.4* 3.2*  More results in Results Review 3.5   Chloride 88* 94*  More results in Results  Review 97*   CO2 35* 36*  More results in Results Review 34*   BUN 24 25  More results in Results Review 25   Creatinine 0.9 0.8  More results in Results Review 0.8   Calcium  10.8* 10.6*  More results in Results Review 10.9*   Albumin  1.9*  --   --  2.0*   Protein, Total 5.8*  --   --  6.0   Bilirubin, Total 0.6  --   --  0.5   Alkaline Phosphatase 156*  --   --  158*   ALT 25  --   --  27   AST (SGOT) 36  --   --  36   Glucose 138* 231*  More results in Results Review 136*   More results in Results Review = values in this interval not displayed.                   Microbiology, reviewed and are significant for:  Microbiology Results (last 15 days)       Procedure Component Value Units Date/Time    COVID-19 and Influenza ([place]) (symptomatic) [[id number]]  (Abnormal) Collected: 10/09/23 1228    Order Status: Completed Specimen: Swab from Anterior Nares Updated: 10/09/23 1327     SARS-CoV-2 (COVID-19) RNA Not Detected     Influenza A RNA Detected     Influenza B RNA Not Detected    Narrative:      A result of Detected indicates POSITIVE for the presence of viral RNA  A result of Not Detected indicates NEGATIVE for the presence of viral RNA    Test performed using the Roche cobas [place] SARS-CoV-2 & Influenza A/B assay. This is a multiplex real-time RT-PCR assay for the detection of SARS-CoV-2, influenza A, and influenza B virus RNA. Viral nucleic acids may persist in vivo, independent of viability. Detection of viral nucleic acid does not imply the presence of infectious virus, or that virus nucleic acid is the cause of clinical symptoms. Negative results do not preclude SARS-CoV-2, influenza A, and/or influenza B infection and should not be used as the sole basis for diagnosis, treatment or other patient management decisions. Invalid results may be due to inhibiting substances in the specimen and recollection should occur.             Imaging, reviewed and are significant for:  XR Chest AP Portable    Result Date:  10/11/2023  1. Interval increase in left perihilar opacity, left-sided pleural effusion and associated left basilar atelectasis and/or pneumonia. 2. There has been no significant change in the appearance of the right lung. 3. Reported bilateral pulmonary nodules on the CT scan are less well delineated on this study. Please refer to the CT scan for further delineation. Lindie Cunning, MD 10/11/2023 7:15 AM    CT Angio Chest (PE or Trauma Protocol)    Result Date: 10/09/2023   1. No CT evidence of pulmonary embolism. 2. Pleural-based mass and bilateral pulmonary nodules are increased in size 3. Interim insertion of left-sided chest tube with decreased loculated left-sided pleural effusion J. Charlie Hoe, MD 10/09/2023 2:46 PM         Signed by: Damian LELON Rim, MD         [1]   Current Facility-Administered Medications  Medication Dose Route Frequency Last Rate   [2]   Current Facility-Administered Medications   Medication Dose Route    acetaminophen   650 mg Oral    benzocaine -menthol   1 lozenge Buccal    benzonatate   100 mg Oral    carboxymethylcellulose sodium  1 drop Both Eyes    dextrose   15 g of glucose Oral    Or    dextrose   12.5 g Intravenous    Or    dextrose   12.5 g Intravenous    Or    glucagon  (rDNA)  1 mg Intramuscular    magnesium  sulfate  1 g Intravenous    melatonin  3 mg Oral    morphine  (concentrate)  2 mg Oral    naloxone   0.2 mg Intravenous    potassium & sodium phosphates   2 packet Oral    potassium chloride   0-60 mEq Oral    Or    potassium chloride   0-60 mEq Oral    Or    potassium chloride   10 mEq Intravenous    saline  2 spray Each Nare    senna  8.6 mg Oral

## 2023-10-15 NOTE — SLP Eval Note (Signed)
 [facility]   Speech Language Pathology  SLP Clinical Bedside Swallow Evaluation     Patient: Carl [name] MRN#: [id number] Room: QN417/QN417.98    Recommendations/Plan:   Recommendations:  Solids:  Regular Solids (RG7)  Liquids: Thin Liquids (IDDSI TN0) any modality  Meds: PO, whole, with liquid     Precautions:   Precautions/Compensations: alert and awake, upright positioning  Supervision: Independent    Plan:   Referrals: Otolaryngology (ENT) consult  Plan of care: begin/continue oral diet, precautions and strategy training, dysphagia treatment, and FEES  SLP Frequency Recommended: 1-2x/wk  Discharge recommendations: Home with supervision/assistance, Outpatient speech    Assessment:   Pt with concern for dysphagia in setting of 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 He reports ~40 lb weight loss over past few months due to reduced appetite. Mod-severe dysphonia noted with rough/strained/breathy vocal quality since January. Family reports increased incidence of cough with PO intake.     Pt accepts all PO trials today. Oral phase timely. Pharyngeal swallow response present with single swallow. No overt s/s aspiration, though elevated risk silent aspiration in setting of dysphonia. Recommend continue regular diet and thin liquids. To benefit from FEES in setting of mod-sev dysphonia, weight loss, and report of overt s/s aspiration. Pt/family agreeable.     Therapy Diagnosis: dysphagia unspecified    Prognosis: good    History of Present Illness:   History of Present Illness: Carl [name] is a 83 y.o. 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 Patient also was found to have the flu. Otherwise patient is resting  comforably on 2L o2.     History of Intubation: Not intubated this admission    Imaging:  No results found.    Medical Diagnosis: Influenza A [J10.1]  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 [I50.33]    Medical History[1]    Speech Therapy or Relevant Medical History  None    Subjective:   Patient is agreeable to participation in the therapy session. Family and/or guardian are agreeable to patient's participation in the therapy session. Nursing clears patient for therapy. Patient's medical condition is appropriate for speech therapy intervention at this time.  Family present at bedside.   PAIN: no     Behavior/cognition:  awake, alert, calm, pleasant, and cooperative    Objective:   Patient Status:    - Current diet: Regular solids (RG7) and Thin liquids (TN0)   - Position: in bed and upright    - Medical equipment in place: telemetry    - Respiratory status: room air   - Interpreter services: n/a   - Precautions: droplet   - Food allergies: none listed     Oral Motor Skills and Voice Assessment:  Oral motor exam: WFL   Speech: WFL   Vocal Quality: breathy , hoarse, and other:strained , judged severity:mod-severe     Oral Inspection:   Lips: moist/pink  Tongue: moist/pink  Saliva: WFL  Teeth: edentulous, top dentures, and bottom dentures  Oral care provided: no  Comments: N/A     PO Trials Presented:  Ice chips  Thin (TN0) via spoon, via cup, and via straw   Puree (PU4)  Regular Solids (RG7)    Oral Phase:  WFL     Pharyngeal Phase/Airway Protection:  present hyolaryngeal movement   no overt signs or symptoms of aspiration     Patient left with call bell within reach, all needs met, fall mat in place and bed alarm activated, and all questions answered. RN notified of session outcome and patient response.   Educated the patient to role of speech therapy, plan of care, goals of therapy and recommendations.     Goals:   Pt will participate in instrumental assessment of swallow to inform recommendations  x1 NEW       Almarie Ohara, MS, CCC-SLP  Available via Epic Secure Chat    PPE worn by provider: procedural mask and gloves     Time of Treatment:  SLP Received On: 10/15/23  Start Time: 1450  Stop Time: 1515  Time Calculation (min): 25 min         [1]   Past Medical History:  Diagnosis Date    Diabetes mellitus     Hyperlipidemia     Hypertension

## 2023-10-15 NOTE — Plan of Care (Signed)
 Problem: Moderate/High Fall Risk Score >5  Goal: Patient will remain free of falls  Outcome: Progressing  Flowsheets (Taken 10/14/2023 1930)  Moderate Risk (6-13):   MOD-Consider activation of bed alarm if appropriate   MOD-Apply bed exit alarm if patient is confused   MOD-Floor mat at bedside (where available) if appropriate   MOD-Consider a move closer to Nurses Station   MOD-Remain with patient during toileting   MOD-Place bedside commode and assistive devices out of sight when not in use   MOD-Re-orient confused patients   MOD-Perform dangle, stand, walk (DSW) prior to mobilization   MOD-Request PT/OT consult order for patients with gait/mobility impairment   MOD-include family in multidisciplinary POC discussions   MOD-Use gait belt when appropriate   MOD-Utilize diversion activities   LOW-Fall Interventions Appropriate for Low Fall Risk   LOW-Anticoagulation education for injury risk     Problem: Compromised Moisture  Goal: Moisture level Interventions  Outcome: Progressing  Flowsheets (Taken 10/14/2023 1930)  Moisture level Interventions: Moisture wicking products, Moisture barrier cream     Problem: Compromised Activity/Mobility  Goal: Activity/Mobility Interventions  Outcome: Progressing  Flowsheets (Taken 10/14/2023 1930)  Activity/Mobility Interventions: Pad bony prominences, TAP Seated positioning system when OOB, Promote PMP, Reposition q 2 hrs / turn clock, Offload heels     Problem: Everyday - Heart Failure  Goal: Stable Vital Signs and Fluid Balance  Outcome: Progressing  Flowsheets (Taken 10/15/2023 0249)  Stable Vital Signs and Fluid Balance:   Daily Standing Weights in the morning using the same scale, after using the bathroom and before breadfast.  If unable to stand, zero the bed and use the bed scale   Monitor labs and report abnormalities to physician   Strict Intake/Output   Monitor, assess vital signs and telemetry per policy   Wean oxygen as needed if appropriate   Assess for  swelling/edema  Goal: Mobility/Activity is Maintained at Optimal Level for Patient  Outcome: Progressing  Flowsheets (Taken 10/15/2023 0249)  Mobility/Activity is Maintained at Optimal Level for Patient:   Increase mobility as tolerated/progressive mobility protocol   Maintain SCD's as Ordered   Perform active/passive ROM   Assess for changes in respiratory status, level of consciousness and/or development of fatigue   Reposition patient every 2 hours and as needed unless able to reposition self   Consult with Physical Therapy and/or Occupational Therapy  Goal: Nutritional Intake is Adequate  Outcome: Progressing  Flowsheets (Taken 10/15/2023 0249)  Nutritional Intake is Adequate:   Consult/Collaborate with Nutritionist   Patient and family teaching on low sodium diet   Encourage/perform oral hygiene as appropriate   Assess appetite,anorexia and amount of meal/food tolerated  Goal: Teaching-Using CHF Warning Zones and Educational Videos  Outcome: Progressing  Flowsheets (Taken 10/15/2023 0249)  Teaching-Using CHF Warning Zones and Educational Videos:   Signs & Symptoms of CHF   Daily Standing Weights & record   Document in the Education Tab in EPIC with Teach-back   CHF Warning Zones and when to call for help   Medications   Sodium Restriction   Fluid Restriction if appropriate     Problem: Inadequate Gas Exchange  Goal: Adequate oxygenation and improved ventilation  Outcome: Progressing  Flowsheets (Taken 10/15/2023 0249)  Adequate oxygenation and improved ventilation:   Assess lung sounds   Monitor SpO2 and treat as needed   Teach/reinforce use of incentive spirometer 10 times per hour while awake, cough and deep breath as needed   Position for maximum ventilatory efficiency  Plan activities to conserve energy: plan rest periods   Increase activity as tolerated/progressive mobility   Consult/collaborate with Respiratory Therapy   Provide mechanical and oxygen support to facilitate gas exchange  Goal: Patent Airway  maintained  Outcome: Progressing  Flowsheets (Taken 10/15/2023 0249)  Patent airway maintained:   Position patient for maximum ventilatory efficiency   Provide adequate fluid intake to liquefy secretions   Suction secretions as needed   Reinforce use of ordered respiratory interventions (i.e. CPAP, BiPAP, Incentive Spirometer, Acapella, etc.)   Reposition patient every 2 hours and as needed unless able to self-reposition     Problem: Compromised Sensory Perception  Goal: Sensory Perception Interventions  Outcome: Progressing  Flowsheets (Taken 10/14/2023 1930)  Sensory Perception Interventions: Offload heels, Pad bony prominences, Reposition q 2hrs/turn Clock, Q2 hour skin assessment under devices if present     Problem: Compromised Friction/Shear  Goal: Friction and Shear Interventions  Outcome: Progressing  Flowsheets (Taken 10/14/2023 1930)  Friction and Shear Interventions: Pad bony prominences, Off load heels, HOB 30 degrees or less unless contraindicated, Consider: TAP seated positioning, Heel foams     Problem: Safety  Goal: Patient will be free from injury during hospitalization  Outcome: Progressing  Flowsheets (Taken 10/15/2023 0249)  Patient will be free from injury during hospitalization:   Assess patient's risk for falls and implement fall prevention plan of care per policy   Provide and maintain safe environment   Use appropriate transfer methods   Hourly rounding   Include patient/ family/ care giver in decisions related to safety   Ensure appropriate safety devices are available at the bedside   Provide alternative method of communication if needed (communication boards, writing)  Goal: Patient will be free from infection during hospitalization  Outcome: Progressing  Flowsheets (Taken 10/15/2023 0249)  Free from Infection during hospitalization:   Assess and monitor for signs and symptoms of infection   Monitor lab/diagnostic results   Monitor all insertion sites (i.e. indwelling lines, tubes, urinary  catheters, and drains)   Encourage patient and family to use good hand hygiene technique     Problem: Psychosocial and Spiritual Needs  Goal: Demonstrates ability to cope with hospitalization/illness  Outcome: Progressing  Flowsheets (Taken 10/15/2023 0249)  Demonstrates ability to cope with hospitalizations/illness:   Encourage verbalization of feelings/concerns/expectations   Provide quiet environment   Assist patient to identify own strengths and abilities   Reinforce positive adaptation of new coping behaviors   Encourage participation in diversional activity   Encourage patient to set small goals for self   Include patient/ patient care companion in decisions     Problem: Altered GI Function  Goal: Fluid and electrolyte balance are achieved/maintained  Outcome: Progressing  Flowsheets (Taken 10/15/2023 0249)  Fluid and electrolyte balance are achieved/maintained:   Monitor/assess lab values and report abnormal values   Observe for cardiac arrhythmias   Monitor for muscle weakness   Assess and reassess fluid and electrolyte status     Problem: Fluid and Electrolyte Imbalance/ Endocrine  Goal: Fluid and electrolyte balance are achieved/maintained  Outcome: Progressing  Flowsheets (Taken 10/15/2023 0249)  Fluid and electrolyte balance are achieved/maintained:   Monitor/assess lab values and report abnormal values   Observe for cardiac arrhythmias   Monitor for muscle weakness   Assess and reassess fluid and electrolyte status  Goal: Adequate hydration  Outcome: Progressing  Flowsheets (Taken 10/15/2023 0249)  Adequate hydration:   Assess mucus membranes, skin color, turgor, perfusion and presence of edema   Assess  for peripheral, sacral, periorbital and abdominal edema   Monitor and assess vital signs and perfusion     Problem: Nutrition  Goal: Nutritional intake is adequate  Outcome: Progressing  Flowsheets (Taken 10/15/2023 0249)  Nutritional intake is adequate:   Consult/collaborate with Speech Therapy (swallow  evaluations)   Consult/collaborate with Clinical Nutritionist     Problem: Diabetes: Glucose Imbalance  Goal: Blood glucose stable at established goal  Outcome: Progressing  Flowsheets (Taken 10/14/2023 1551 by Maia Eva Charleston, RN)  Blood glucose stable at established goal:   Monitor lab values   Monitor intake and output.  Notify LIP if urine output is < 30 mL/hour.   Follow fluid restrictions/IV/PO parameters   Assess for hypoglycemia /hyperglycemia   Coordinate medication administration with meals, as indicated   Ensure adequate hydration   Ensure appropriate diet and assess tolerance   Monitor/assess vital signs   Include patient/family in decisions related to nutrition/dietary selections   Ensure appropriate consults are obtained (Nutrition, Diabetes Education, and Case Management)   Ensure patient/family has adequate teaching materials     Problem: Hemodynamic Status: Cardiac  Goal: Stable vital signs and fluid balance  Outcome: Progressing  Flowsheets (Taken 10/15/2023 0249)  Stable vital signs and fluid balance:   Assess signs and symptoms associated with cardiac rhythm changes   Monitor lab values     Problem: Inadequate Tissue Perfusion  Goal: Adequate tissue perfusion will be maintained  Outcome: Progressing  Flowsheets (Taken 10/14/2023 1551 by Maia Eva Charleston, RN)  Adequate tissue perfusion will be maintained:   Monitor/assess lab values and report abnormal values   Monitor/assess neurovascular status (pulses, capillary refill, pain, paresthesia, paralysis, presence of edema)   Monitor/assess for signs of VTE (edema of calf/thigh redness, pain)   Monitor for signs and symptoms of a pulmonary embolism (dyspnea, tachypnea, tachycardia, confusion)   Encourage/assist patient as needed to turn, cough, and perform deep breathing every 2 hours     Problem: Ineffective Gas Exchange  Goal: Effective breathing pattern  Outcome: Progressing  Flowsheets (Taken 10/14/2023 1551 by Maia Eva Charleston,  RN)  Effective breathing pattern:   Maintain CO2 level per LIP order   Monitor end tidal CO2 level per LIP order   Teach/reinforce use of ordered respiratory interventions (ie. CPAP, BiPAP, Incentive Spirometer, Acapella)     Problem: Impaired Mobility  Goal: Mobility/Activity is maintained at optimal level for patient  Outcome: Progressing  Flowsheets (Taken 10/14/2023 1551 by Maia Eva Charleston, RN)  Mobility/activity is maintained at optimal level for patient:   Encourage independent activity per ability   Consult/collaborate with Physical Therapy and/or Occupational Therapy     Problem: Peripheral Neurovascular Impairment  Goal: Extremity color, movement, sensation are maintained or improved  Outcome: Progressing  Flowsheets (Taken 10/15/2023 0249)  Extremity color, movement, sensation are maintained or improved: Assess extremity for proper alignment     Problem: Compromised skin integrity  Goal: Skin integrity is maintained or improved  Outcome: Progressing  Flowsheets (Taken 10/14/2023 1551 by Maia Eva Charleston, RN)  Skin integrity is maintained or improved:   Assess Braden Scale every shift   Turn or reposition patient every 2 hours or as needed unless able to reposition self   Increase activity as tolerated/progressive mobility   Relieve pressure to bony prominences   Avoid shearing   Keep skin clean and dry   Monitor patient's hygiene practices   Collaborate with Wound, Ostomy, and Continence Nurse   Utilize specialty bed   Keep head of  bed 30 degrees or less (unless contraindicated)     Problem: Nutrition  Goal: Nutritional intake is adequate  Outcome: Progressing  Flowsheets (Taken 10/15/2023 0249)  Nutritional intake is adequate:   Consult/collaborate with Speech Therapy (swallow evaluations)   Consult/collaborate with Clinical Nutritionist     Problem: Droplet Isolation  Goal: Prevent transmission of other diagnosed infection while caring for patient in Droplet isolation  Outcome: Progressing  Flowsheets  (Taken 10/15/2023 0249)  Is appropriate isolation sign and PPE outside Patient Room?: Yes  Provided Patient and family education: Verbal education  Strict Hand Hygiene: Yes  Donning and doffing PPE properly both Staff and Visitors: Yes  Multi-dose Medications stored in plastic bags?: Yes

## 2023-10-15 NOTE — Progress Notes (Addendum)
 Brief background:  83 y/o M, presented with SOB was found to be in CHF exacerbation with underlying A-fib with RVR. Patient also was found to have the flu.   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     Last 24 Hrs: PRN morphine  2mg  oral solution given for work of breathing/dyspnea, pt stated it helps a lot.Pt denies chest pain, HA/dizziness or N/V. Schedule medications given as order. Provided pt & his son education on strict I's & Os & daily weights, both verbalized understanding. Safety & fall precautions in placed. Call bell within pt reach. Purposeful rounding done. Isolation precautions maintained.     LDA (drains, central lines, foley, & the indication): Pleurex cath in place, 22G RUA     Discharge barriers: Medical clearance     Abnormal labs/VS (needing any orders): PT-27.0, INR-2.4     Respiratory (changes in oxygenation): Pt on 2L sating at 96-99%, SOB present. Reinforced pt teaching on IS & encouraged pt to use it more often.    GI/GU (changes in output, BM, foley): cont x 2. Last BM 2/5, senna docusate given to pt.     Neuro changes: AxOx4     Nutrition: Heart Healthy diet     Activity/ Safety/ Mobility questions (need for PT/OT, sitter): x1 person assist & a walker. Provided pt & his son education on safety & fall precautions, both verbalized understanding.     Tele/ Rhythm (changes?): Afib in tele     Skin/ Wounds: Scattered (bruising, scaling & scars),  Abrasions(BUE/hands), Cracking(BLE, heels), Blanchable redness(sacrum,buttocks, gluteal cleft, back, b/l plantar feet & heels), +3 pitting edema on both feet  VTE prophylaxis: Warfarin. Ambulation.     Upcoming Procedures: TBD     Case management needs: TBD     Discharge plan: TBD     Plans: *Continue IV lasix . Strict I's & O's, daily weights              * Neb treatment              * Tele monitoring              *Onco & cardiology following              *Thoracic  surgery recommend decreasing pleurx drainage to once a week (due next Wednesday as there was an attempt at drainage this AM)               *Continue to wean oxygen as tolerated               *to start chemotherapy and immunotherapy in the coming weeks              *follow up with Dr. Maree on 2/27 to discuss possible pleurx removal if drainage remains low

## 2023-10-16 ENCOUNTER — Telehealth: Payer: Self-pay

## 2023-10-16 ENCOUNTER — Telehealth: Payer: Self-pay | Admitting: Residents

## 2023-10-16 LAB — BASIC METABOLIC PANEL
Anion Gap: 13 (ref 5.0–15.0)
BUN: 31 mg/dL — ABNORMAL HIGH (ref 9–28)
CO2: 34 meq/L — ABNORMAL HIGH (ref 17–29)
Calcium: 11.2 mg/dL — ABNORMAL HIGH (ref 7.9–10.2)
Chloride: 86 meq/L — ABNORMAL LOW (ref 99–111)
Creatinine: 0.9 mg/dL (ref 0.5–1.5)
GFR: >60 mL/min/{1.73_m2} (ref >=60.0)
Glucose: 297 mg/dL — ABNORMAL HIGH (ref 70–100)
Potassium: 3.7 meq/L (ref 3.5–5.3)
Sodium: 133 meq/L — ABNORMAL LOW (ref 135–145)

## 2023-10-16 LAB — WHOLE BLOOD GLUCOSE POCT
Whole Blood Glucose POCT: 228 mg/dL — ABNORMAL HIGH (ref 70–100)
Whole Blood Glucose POCT: 325 mg/dL — ABNORMAL HIGH (ref 70–100)
Whole Blood Glucose POCT: 163 mg/dL — ABNORMAL HIGH (ref 70–100)
Whole Blood Glucose POCT: 258 mg/dL — ABNORMAL HIGH (ref 70–100)

## 2023-10-16 LAB — PT/INR
INR: 1.9 (ref 0.9–1.1)
PT: 21.6 s — ABNORMAL HIGH (ref 10.1–12.9)

## 2023-10-16 MED ORDER — DILTIAZEM HCL 60 MG PO TABS
60.0000 mg | ORAL_TABLET | Freq: Four times a day (QID) | ORAL | Status: AC
Start: 2023-10-16 — End: 2023-10-17
  Administered 2023-10-16 – 2023-10-17 (×2): 60 mg via ORAL
  Filled 2023-10-16 (×2): qty 1

## 2023-10-16 MED ORDER — ALBUTEROL-IPRATROPIUM 2.5-0.5 (3) MG/3ML IN SOLN
3.0000 mL | Freq: Once | RESPIRATORY_TRACT | 0 refills | Status: AC
Start: 2023-10-16 — End: 2023-10-16

## 2023-10-16 MED ORDER — BUMETANIDE 1 MG PO TABS
2.0000 mg | ORAL_TABLET | Freq: Two times a day (BID) | ORAL | Status: DC
Start: 2023-10-16 — End: 2023-10-17
  Administered 2023-10-16 – 2023-10-17 (×2): 2 mg via ORAL
  Filled 2023-10-16 (×3): qty 2

## 2023-10-16 MED ORDER — EMPAGLIFLOZIN 10 MG PO TABS
10.0000 mg | ORAL_TABLET | Freq: Every day | ORAL | Status: DC
Start: 2023-10-16 — End: 2023-10-17
  Administered 2023-10-16 – 2023-10-17 (×2): 10 mg via ORAL
  Filled 2023-10-16 (×2): qty 1

## 2023-10-16 MED ORDER — TAMSULOSIN HCL 0.4 MG PO CAPS
0.4000 mg | ORAL_CAPSULE | Freq: Every day | ORAL | 0 refills | Status: DC
Start: 2023-10-16 — End: 2023-11-15

## 2023-10-16 MED ORDER — DILTIAZEM HCL ER COATED BEADS 240 MG PO CP24
240.0000 mg | ORAL_CAPSULE | Freq: Every day | ORAL | Status: DC
Start: 2023-10-17 — End: 2023-10-17
  Administered 2023-10-17: 240 mg via ORAL
  Filled 2023-10-16 (×2): qty 1

## 2023-10-16 MED ORDER — FUROSEMIDE 10 MG/ML IJ SOLN
120.0000 mg | Freq: Two times a day (BID) | INTRAMUSCULAR | Status: DC
Start: 2023-10-16 — End: 2023-10-16
  Administered 2023-10-16: 120 mg via INTRAVENOUS
  Filled 2023-10-16: qty 12

## 2023-10-16 MED ORDER — MORPHINE SULFATE (CONCENTRATE) 100 MG/5ML PO SOLN
2.0000 mg | Freq: Four times a day (QID) | ORAL | 0 refills | Status: DC | PRN
Start: 2023-10-16 — End: 2023-10-23

## 2023-10-16 MED ORDER — TAMSULOSIN HCL 0.4 MG PO CAPS
0.4000 mg | ORAL_CAPSULE | Freq: Every day | ORAL | Status: DC
Start: 2023-10-16 — End: 2023-10-17
  Administered 2023-10-16 – 2023-10-17 (×2): 0.4 mg via ORAL
  Filled 2023-10-16 (×2): qty 1

## 2023-10-16 MED ORDER — BUDESONIDE-FORMOTEROL FUMARATE 160-4.5 MCG/ACT IN AERO
2.0000 | INHALATION_SPRAY | Freq: Two times a day (BID) | RESPIRATORY_TRACT | 0 refills | Status: DC
Start: 2023-10-16 — End: 2023-10-24

## 2023-10-16 MED ORDER — DILTIAZEM HCL 60 MG PO TABS
60.0000 mg | ORAL_TABLET | Freq: Four times a day (QID) | ORAL | 0 refills | Status: DC
Start: 2023-10-16 — End: 2023-11-15

## 2023-10-16 NOTE — Progress Notes (Signed)
 Neuro AOX4,      CV Afib on tele, +3/4 pitting edema BLE, +pulses, Teds placed and legs elevated      R Diminished with rhonchi, on 2L NC, unable to wean lower than 2. TCDB encouraged.     GI No BM this shift, and soft non tender, non distended, tol diet     GU Pt feels urge to void, but only is able to produce about 200 cc at a time, bladder scan showed 400+ cc urine a few hours after voiding. Md made aware, request for Flomax , order received.      Skin Blanch redness, bruising and skin tears scattered     Pain Denies pain     Shift events      Plan/[employer] Will likely be L[facility]'d home with home health and O2 tomorrow     Psych/social Family at bedside, supportive, asking questions     Mobility   1 person assist with walker, weak            Pt bed in locked position, call bell in reach, able to make needs known

## 2023-10-16 NOTE — Progress Notes (Addendum)
 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  Patient also was found to have the flu. Otherwise patient is resting comforably on 2L o2.     Plan:       #Acute on chronic diastolic heart failure:   -Continue home thiazide-substitute HCTZ for his chlorthalidone   -incerased lasix  to 120 mg BID as discussed with cardiology   -daily weights, I/O  -long term telemetry      #SOB  --remains short of breath, this is likely from tumor burden  however could possibly be from afib?  --consulted cadiology   --Cont continue to diurese as above  -- Continue to wean oxygen as tolerated  -- Discussed with IR for troubleshoot Pleurx as has not been drained for 4 days  -- Messaged oncology given findings on CT scan that shows mass in the lung.  --Liquid Morphine  2mg , PRN for Shortness of breath - this seems to help significantly over night.   ==f/u SLP    #Atrial fibrillation with RVR  -resume home metoprolol  100mg  daily  -should be safe for diltiazem  if this is needed  -Long term telemetry as above  -- Continue dilt to 60mg  TID   --Cadiology consulted.   -takes coumadin -unclear why this medication but long standing, managing as below     #Coumadin  management:    f/u INR improved, pharmacy to dose.     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  Noted the symptoms are likely due to psychosomatic reasons such as anxiety.  We discussed how palliative care could provide ways to alleviate some of the symptoms.  --f/u Palliative care.        #Oropharyngeal cancer:  he is to start chemotherapy and immunotherapy in the coming weeks; has not had any treatments thus far     Disposition: ordered nutrition consult for him per his request. Patient can be discharged tomorrow if he is feeling well. F/u with cardiology regarding diuretics plan.      Dru Primeau is confined to a room without a bathroom and level confined without a bathroom. Deovion Bigbee's home provides adequate access between rooms and can maneuvering throughout the home's space. The home also has surfaces for use of the manual wheelchair patient has not expressed an unwillingness to use the manual wheelchair that is provided in the home and has sufficient upper extremity function along with physical and mental capabilities needed to safely self-propel the manual  wheelchair that is provided in the home during a typical day. 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 he cannot ambulate more than 100 feet with a walker, cane, or crutch. As well as being unable to safely perform ADLs in the home, he is able to self-propel.     O2 testing for Tyq[facility] Pink was ordered by me and I have reviewed the testing results showing the need for home O2.     Chemical DVT ppx  FULL code  Telemetry needs:  long term as above  Case  discussed with: patient     Safety Checklist:     DVT prophylaxis:  CHEST guideline (See page [id number]) Chemical   Foley:  [place] Rn Foley protocol Not present   IVs:  Peripheral IV   PT/OT: Ordered   Daily CBC & or Chem ordered:  SHM/ABIM guidelines (see #5) Yes, due to clinical and lab instability   Reference for approximate charges of common labs: CBC auto diff - $76  BMP - $99  Mg - $79    Lines:     Patient Lines/Drains/Airways Status       Active PICC Line / CVC Line / PIV Line / Drain / Airway / Intraosseous Line / Epidural Line / ART Line / Line / Wound / Pressure Ulcer / NG/OG Tube       Name Placement date Placement time Site Days    Peripheral IV 10/09/23 18 G Standard Right Antecubital 10/09/23  1222  Antecubital  less than 1                     Disposition: (Please see PAF column for Expected D/C Date)   Today's date: 10/16/2023  Admit Date: 10/09/2023 11:58 AM  LOS: 7  Clinical Milestones: Afiv RVR   Anticipated discharge ne:       Subjective     CC: 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  Feeling better today  NAD  NAEON  Rest of ROS same as HPI     Review of Systems:     As per HPI    Physical Exam:     VITAL SIGNS PHYSICAL EXAM   Temp:  [97.3 F (36.3 C)-98.2 F (36.8 C)] 97.3 F (36.3 C)  Heart Rate:  [88-113] 91  Resp Rate:  [16-21] 16  BP: (94-148)/(60-84) 120/75  Blood Glucose:        Intake/Output Summary (Last 24 hours) at 10/16/2023 0818  Last data filed at 10/16/2023 0729  Gross per 24 hour   Intake 1160 ml   Output 1847 ml   Net -687 ml    Physical Exam  General:Aaox3,NAD   Cardiovascular: regular rate and rhythm, no murmurs, rubs or gallops  Lungs: Decreased breath sounds on theright lower field.   Abdomen: soft, non-tender, non-distended; no palpable masses,  normoactive bowel sounds  Extremities: 2+ edema         Meds:     Medications were reviewed:  Current Facility-Administered Medications   Medication Dose Route Frequency    albuterol -ipratropium  3 mL Nebulization Once     albuterol -ipratropium  3 mL Nebulization Q6H SCH    allopurinol   300 mg Oral Daily    atorvastatin   40 mg Oral Daily    budesonide -formoterol   2 puff Inhalation BID    dilTIAZem   60 mg Oral 4 times per day  furosemide   120 mg Intravenous BID    hydroCHLOROthiazide   25 mg Oral Daily    insulin  lispro  1-4 Units Subcutaneous QHS    insulin  lispro  1-8 Units Subcutaneous TID AC    isosorbide  mononitrate  30 mg Oral Daily    metoprolol  succinate  100 mg Oral Daily    senna  8.6 mg Oral QHS    warfarin  1 mg Oral Daily at 1800    warfarin   Oral See Admin Instructions     Infusion Meds[1]  PRN Medications[2]      Labs:     Labs (last 72 hours):    Recent Labs   Lab 10/10/23  0335 10/09/23  1225   WBC 16.04* 16.71*   Hemoglobin 12.9 12.8   Hematocrit 38.4 38.9   Platelet Count 234 271       Recent Labs   Lab 10/16/23  0443 10/15/23  0341   PT 21.6* 27.0*   INR 1.9 2.4    Recent Labs   Lab 10/15/23  1344 10/15/23  1114 10/13/23  1755 10/10/23  0335 10/09/23  1225   Sodium 133* 132* 137  More results in Results Review 140   Potassium 3.5 3.6 3.4*  More results in Results Review 3.5   Chloride 88* 86* 88*  More results in Results Review 97*   CO2 34* 36* 35*  More results in Results Review 34*   BUN 26 27 24   More results in Results Review 25   Creatinine 0.9 0.9 0.9  More results in Results Review 0.8   Calcium  10.6* 10.7* 10.8*  More results in Results Review 10.9*   Albumin   --   --  1.9*  --  2.0*   Protein, Total  --   --  5.8*  --  6.0   Bilirubin, Total  --   --  0.6  --  0.5   Alkaline Phosphatase  --   --  156*  --  158*   ALT  --   --  25  --  27   AST (SGOT)  --   --  36  --  36   Glucose 284* 306* 138*  More results in Results Review 136*   More results in Results Review = values in this interval not displayed.                   Microbiology, reviewed and are significant for:  Microbiology Results (last 15 days)       Procedure Component Value Units Date/Time    COVID-19 and Influenza ([place]) (symptomatic)  [[id number]]  (Abnormal) Collected: 10/09/23 1228    Order Status: Completed Specimen: Swab from Anterior Nares Updated: 10/09/23 1327     SARS-CoV-2 (COVID-19) RNA Not Detected     Influenza A RNA Detected     Influenza B RNA Not Detected    Narrative:      A result of Detected indicates POSITIVE for the presence of viral RNA  A result of Not Detected indicates NEGATIVE for the presence of viral RNA    Test performed using the Roche cobas [place] SARS-CoV-2 & Influenza A/B assay. This is a multiplex real-time RT-PCR assay for the detection of SARS-CoV-2, influenza A, and influenza B virus RNA. Viral nucleic acids may persist in vivo, independent of viability. Detection of viral nucleic acid does not imply the presence of infectious virus, or that virus nucleic acid is the cause of clinical symptoms. Negative  results do not preclude SARS-CoV-2, influenza A, and/or influenza B infection and should not be used as the sole basis for diagnosis, treatment or other patient management decisions. Invalid results may be due to inhibiting substances in the specimen and recollection should occur.             Imaging, reviewed and are significant for:  XR Chest AP Portable    Result Date: 10/11/2023  1. Interval increase in left perihilar opacity, left-sided pleural effusion and associated left basilar atelectasis and/or pneumonia. 2. There has been no significant change in the appearance of the right lung. 3. Reported bilateral pulmonary nodules on the CT scan are less well delineated on this study. Please refer to the CT scan for further delineation. Lindie Cunning, MD 10/11/2023 7:15 AM    CT Angio Chest (PE or Trauma Protocol)    Result Date: 10/09/2023   1. No CT evidence of pulmonary embolism. 2. Pleural-based mass and bilateral pulmonary nodules are increased in size 3. Interim insertion of left-sided chest tube with decreased loculated left-sided pleural effusion J. Charlie Hoe, MD 10/09/2023 2:46 PM         Signed by:  Damian LELON Rim, MD         [1]   Current Facility-Administered Medications   Medication Dose Route Frequency Last Rate   [2]   Current Facility-Administered Medications   Medication Dose Route    acetaminophen   650 mg Oral    benzocaine -menthol   1 lozenge Buccal    benzonatate   100 mg Oral    carboxymethylcellulose sodium  1 drop Both Eyes    dextrose   15 g of glucose Oral    Or    dextrose   12.5 g Intravenous    Or    dextrose   12.5 g Intravenous    Or    glucagon  (rDNA)  1 mg Intramuscular    magnesium  sulfate  1 g Intravenous    melatonin  3 mg Oral    morphine  (concentrate)  2 mg Oral    naloxone   0.2 mg Intravenous    potassium & sodium phosphates   2 packet Oral    potassium chloride   0-60 mEq Oral    Or    potassium chloride   0-60 mEq Oral    Or    potassium chloride   10 mEq Intravenous    saline  2 spray Each Nare    senna  8.6 mg Oral

## 2023-10-16 NOTE — Plan of Care (Signed)
 Patient Carl [name], makes needs known and follows command. Patient was supposed to complete ambulating oxygen checked but patient stated he was so tired to walk and will do it the next day. Plan of care explained to patient. Will monitor for any changes.   Problem: Compromised Moisture  Goal: Moisture level Interventions  Outcome: Progressing  Flowsheets (Taken 10/16/2023 2000)  Moisture level Interventions: Moisture wicking products, Moisture barrier cream     Problem: Everyday - Heart Failure  Goal: Stable Vital Signs and Fluid Balance  Outcome: Progressing  Stable Vital Signs and Fluid Balance:   Daily Standing Weights in the morning using the same scale, after using the bathroom and before breadfast.  If unable to stand, zero the bed and use the bed scale   Monitor, assess vital signs and telemetry per policy   Monitor labs and report abnormalities to physician   Strict Intake/Output   Fluid Restriction   Assess for swelling/edema   Wean oxygen as needed if appropriate  Goal: Mobility/Activity is Maintained at Optimal Level for Patient  Outcome: Progressing  Flowsheets (Taken 10/16/2023 1420 by Cathryne Herter, RN)  Mobility/Activity is Maintained at Optimal Level for Patient:   Increase mobility as tolerated/progressive mobility protocol   Reposition patient every 2 hours and as needed unless able to reposition self   Perform active/passive ROM   Maintain SCD's as Ordered   Assess for changes in respiratory status, level of consciousness and/or development of fatigue   Consult with Physical Therapy and/or Occupational Therapy     Problem: Inadequate Gas Exchange  Goal: Adequate oxygenation and improved ventilation  Outcome: Progressing  Adequate oxygenation and improved ventilation:   Assess lung sounds   Monitor SpO2 and treat as needed   Monitor and treat ETCO2   Provide mechanical and oxygen support to facilitate gas exchange   Position for maximum ventilatory efficiency   Teach/reinforce use of incentive  spirometer 10 times per hour while awake, cough and deep breath as needed   Plan activities to conserve energy: plan rest periods   Increase activity as tolerated/progressive mobility   Consult/collaborate with Respiratory Therapy     Problem: Safety  Goal: Patient will be free from injury during hospitalization  Outcome: Progressing  Patient will be free from injury during hospitalization:   Assess patient's risk for falls and implement fall prevention plan of care per policy   Use appropriate transfer methods   Provide and maintain safe environment   Ensure appropriate safety devices are available at the bedside   Include patient/ family/ care giver in decisions related to safety   Hourly rounding   Assess for patients risk for elopement and implement Elopement Risk Plan per policy   Provide alternative method of communication if needed (communication boards, writing)  Goal: Patient will be free from infection during hospitalization  Outcome: Progressing  Free from Infection during hospitalization:   Assess and monitor for signs and symptoms of infection   Monitor lab/diagnostic results   Monitor all insertion sites (i.e. indwelling lines, tubes, urinary catheters, and drains)   Encourage patient and family to use good hand hygiene technique     Problem: Psychosocial and Spiritual Needs  Goal: Demonstrates ability to cope with hospitalization/illness  Outcome: Progressing  Demonstrates ability to cope with hospitalizations/illness:   Encourage verbalization of feelings/concerns/expectations   Provide quiet environment   Assist patient to identify own strengths and abilities   Encourage patient to set small goals for self   Encourage participation in diversional activity  Reinforce positive adaptation of new coping behaviors   Include patient/ patient care companion in decisions   Communicate referral to spiritual care as appropriate     Problem: Altered GI Function  Goal: Fluid and electrolyte balance are  achieved/maintained  Outcome: Progressing  Fluid and electrolyte balance are achieved/maintained:   Monitor/assess lab values and report abnormal values   Assess and reassess fluid and electrolyte status   Observe for cardiac arrhythmias   Monitor for muscle weakness     Problem: Fluid and Electrolyte Imbalance/ Endocrine  Goal: Fluid and electrolyte balance are achieved/maintained  Outcome: Progressing  Fluid and electrolyte balance are achieved/maintained:   Monitor/assess lab values and report abnormal values   Assess and reassess fluid and electrolyte status   Observe for cardiac arrhythmias   Monitor for muscle weakness     Problem: Diabetes: Glucose Imbalance  Goal: Blood glucose stable at established goal  Outcome: Progressing  Blood glucose stable at established goal:   Monitor lab values   Monitor intake and output.  Notify [facility] if urine output is < 30 mL/hour.   Follow fluid restrictions/IV/PO parameters   Include patient/family in decisions related to nutrition/dietary selections   Assess for hypoglycemia /hyperglycemia   Monitor/assess vital signs   Coordinate medication administration with meals, as indicated   Ensure appropriate diet and assess tolerance   Ensure adequate hydration   Ensure appropriate consults are obtained (Nutrition, Diabetes Education, and Case Management)   Ensure patient/family has adequate teaching materials

## 2023-10-16 NOTE — Progress Notes (Signed)
Order for [facility] and [facility] sent to [employer] at [phone]

## 2023-10-16 NOTE — Plan of Care (Signed)
 Problem: Moderate/High Fall Risk Score >5  Goal: Patient will remain free of falls  Outcome: Progressing  Flowsheets (Taken 10/16/2023 0800)  Moderate Risk (6-13):   MOD-include family in multidisciplinary POC discussions   MOD-Use gait belt when appropriate   MOD-Request PT/OT consult order for patients with gait/mobility impairment   MOD-Perform dangle, stand, walk (DSW) prior to mobilization   MOD-Re-orient confused patients   MOD-Utilize diversion activities   MOD-Place bedside commode and assistive devices out of sight when not in use   MOD-Remain with patient during toileting   MOD-Consider a move closer to Nurses Station   MOD-Floor mat at bedside (where available) if appropriate   MOD-Apply bed exit alarm if patient is confused   MOD-Consider activation of bed alarm if appropriate   LOW-Fall Interventions Appropriate for Low Fall Risk   LOW-Anticoagulation education for injury risk     Problem: Compromised Moisture  Goal: Moisture level Interventions  Outcome: Progressing  Flowsheets (Taken 10/16/2023 0800)  Moisture level Interventions: Moisture wicking products, Moisture barrier cream     Problem: Compromised Activity/Mobility  Goal: Activity/Mobility Interventions  Outcome: Progressing  Flowsheets (Taken 10/16/2023 0800)  Activity/Mobility Interventions: Pad bony prominences, TAP Seated positioning system when OOB, Promote PMP, Reposition q 2 hrs / turn clock, Offload heels     Problem: Compromised Sensory Perception  Goal: Sensory Perception Interventions  Outcome: Progressing  Flowsheets (Taken 10/16/2023 0800)  Sensory Perception Interventions: Offload heels, Pad bony prominences, Reposition q 2hrs/turn Clock, Q2 hour skin assessment under devices if present     Problem: Compromised Friction/Shear  Goal: Friction and Shear Interventions  Outcome: Progressing  Flowsheets (Taken 10/16/2023 0800)  Friction and Shear Interventions: Pad bony prominences, Off load heels, HOB 30 degrees or less unless  contraindicated, Consider: TAP seated positioning, Heel foams     Problem: Day of Admission - Heart Failure  Goal: Heart Failure Admission  Outcome: Progressing  Flowsheets (Taken 10/16/2023 1420)  Heart Failure Admission:   Standing Weight on admission, if unable to stand zero the bed and use the bed scale   Vital signs and telemetry per policy   Initiate education with patient and caregiver using CHF Warning Zones and Educational Videos ([employer] or [employer])   Oxygen as needed   Assess for swelling/edema and document   Fluid restriction   Strict Intake/Output     Problem: Everyday - Heart Failure  Goal: Stable Vital Signs and Fluid Balance  Outcome: Progressing  Flowsheets (Taken 10/16/2023 1420)  Stable Vital Signs and Fluid Balance:   Daily Standing Weights in the morning using the same scale, after using the bathroom and before breadfast.  If unable to stand, zero the bed and use the bed scale   Monitor, assess vital signs and telemetry per policy   Monitor labs and report abnormalities to physician   Strict Intake/Output   Fluid Restriction   Assess for swelling/edema   Wean oxygen as needed if appropriate  Goal: Mobility/Activity is Maintained at Optimal Level for Patient  Outcome: Progressing  Flowsheets (Taken 10/16/2023 1420)  Mobility/Activity is Maintained at Optimal Level for Patient:   Increase mobility as tolerated/progressive mobility protocol   Reposition patient every 2 hours and as needed unless able to reposition self   Perform active/passive ROM   Maintain SCD's as Ordered   Assess for changes in respiratory status, level of consciousness and/or development of fatigue   Consult with Physical Therapy and/or Occupational Therapy  Goal: Nutritional Intake is Adequate  Outcome: Progressing  Flowsheets (Taken 10/16/2023 1420)  Nutritional Intake is Adequate:   Fluid Restricction if needed   Cardiac diet-2 gm Sodium   Consult/Collaborate with Nutritionist   Patient and family teaching on low sodium  diet   Assess appetite,anorexia and amount of meal/food tolerated   Encourage/perform oral hygiene as appropriate  Goal: Teaching-Using CHF Warning Zones and Educational Videos  Outcome: Progressing  Flowsheets (Taken 10/16/2023 1420)  Teaching-Using CHF Warning Zones and Educational Videos:   Signs & Symptoms of CHF   Daily Standing Weights & record   CHF Warning Zones and when to call for help   Sodium Restriction   Fluid Restriction if appropriate   Medications   Document in the Education Tab in EPIC with Teach-back     Problem: Day of Discharge - Heart Failure  Goal: Discharge Education  Outcome: Progressing  Flowsheets (Taken 10/16/2023 1420)  Day of Discharge:   After Visit Summary (Discharge Instuctions) with medications   CHF Warning Zones and when to call for help   Low Sodium Diet   Follow-up appointments   Daily Standing Weights   Fluid Restriction     Problem: Inadequate Gas Exchange  Goal: Adequate oxygenation and improved ventilation  Outcome: Progressing  Flowsheets (Taken 10/16/2023 1420)  Adequate oxygenation and improved ventilation:   Assess lung sounds   Monitor SpO2 and treat as needed   Monitor and treat ETCO2   Provide mechanical and oxygen support to facilitate gas exchange   Position for maximum ventilatory efficiency   Teach/reinforce use of incentive spirometer 10 times per hour while awake, cough and deep breath as needed   Plan activities to conserve energy: plan rest periods   Increase activity as tolerated/progressive mobility   Consult/collaborate with Respiratory Therapy  Goal: Patent Airway maintained  Outcome: Progressing  Flowsheets (Taken 10/16/2023 1420)  Patent airway maintained:   Position patient for maximum ventilatory efficiency   Provide adequate fluid intake to liquefy secretions   Suction secretions as needed   Reinforce use of ordered respiratory interventions (i.e. CPAP, BiPAP, Incentive Spirometer, Acapella, etc.)   Reposition patient every 2 hours and as needed unless  able to self-reposition     Problem: Safety  Goal: Patient will be free from injury during hospitalization  Outcome: Progressing  Flowsheets (Taken 10/16/2023 1420)  Patient will be free from injury during hospitalization:   Assess patient's risk for falls and implement fall prevention plan of care per policy   Use appropriate transfer methods   Provide and maintain safe environment   Ensure appropriate safety devices are available at the bedside   Include patient/ family/ care giver in decisions related to safety   Hourly rounding   Assess for patients risk for elopement and implement Elopement Risk Plan per policy   Provide alternative method of communication if needed (communication boards, writing)  Goal: Patient will be free from infection during hospitalization  Outcome: Progressing  Flowsheets (Taken 10/16/2023 1420)  Free from Infection during hospitalization:   Assess and monitor for signs and symptoms of infection   Monitor lab/diagnostic results   Monitor all insertion sites (i.e. indwelling lines, tubes, urinary catheters, and drains)   Encourage patient and family to use good hand hygiene technique     Problem: Psychosocial and Spiritual Needs  Goal: Demonstrates ability to cope with hospitalization/illness  Outcome: Progressing  Flowsheets (Taken 10/16/2023 1420)  Demonstrates ability to cope with hospitalizations/illness:   Encourage verbalization of feelings/concerns/expectations   Provide quiet environment   Assist patient to  identify own strengths and abilities   Encourage patient to set small goals for self   Encourage participation in diversional activity   Reinforce positive adaptation of new coping behaviors   Include patient/ patient care companion in decisions   Communicate referral to spiritual care as appropriate     Problem: Altered GI Function  Goal: Fluid and electrolyte balance are achieved/maintained  Outcome: Progressing  Flowsheets (Taken 10/16/2023 1420)  Fluid and electrolyte balance  are achieved/maintained:   Monitor/assess lab values and report abnormal values   Assess and reassess fluid and electrolyte status   Observe for cardiac arrhythmias   Monitor for muscle weakness     Problem: Fluid and Electrolyte Imbalance/ Endocrine  Goal: Fluid and electrolyte balance are achieved/maintained  Outcome: Progressing  Flowsheets (Taken 10/16/2023 1420)  Fluid and electrolyte balance are achieved/maintained:   Monitor/assess lab values and report abnormal values   Assess and reassess fluid and electrolyte status   Observe for cardiac arrhythmias   Monitor for muscle weakness  Goal: Adequate hydration  Outcome: Progressing  Flowsheets (Taken 10/16/2023 1420)  Adequate hydration:   Assess mucus membranes, skin color, turgor, perfusion and presence of edema   Monitor and assess vital signs and perfusion   Assess for peripheral, sacral, periorbital and abdominal edema     Problem: Diabetes: Glucose Imbalance  Goal: Blood glucose stable at established goal  Outcome: Progressing  Flowsheets (Taken 10/16/2023 1420)  Blood glucose stable at established goal:   Monitor lab values   Monitor intake and output.  Notify LIP if urine output is < 30 mL/hour.   Follow fluid restrictions/IV/PO parameters   Include patient/family in decisions related to nutrition/dietary selections   Assess for hypoglycemia /hyperglycemia   Monitor/assess vital signs   Coordinate medication administration with meals, as indicated   Ensure appropriate diet and assess tolerance   Ensure adequate hydration   Ensure appropriate consults are obtained (Nutrition, Diabetes Education, and Case Management)   Ensure patient/family has adequate teaching materials     Problem: Nutrition  Goal: Nutritional intake is adequate  Outcome: Progressing  Flowsheets (Taken 10/16/2023 1420)  Nutritional intake is adequate:   Consult/collaborate with Clinical Nutritionist   Consult/collaborate with Speech Therapy (swallow evaluations)     Problem: Hemodynamic  Status: Cardiac  Goal: Stable vital signs and fluid balance  Outcome: Progressing  Flowsheets (Taken 10/16/2023 1420)  Stable vital signs and fluid balance: Assess signs and symptoms associated with cardiac rhythm changes     Problem: Inadequate Tissue Perfusion  Goal: Adequate tissue perfusion will be maintained  Outcome: Progressing  Flowsheets (Taken 10/16/2023 1420)  Adequate tissue perfusion will be maintained:   Monitor/assess lab values and report abnormal values   Monitor/assess neurovascular status (pulses, capillary refill, pain, paresthesia, paralysis, presence of edema)   Monitor/assess for signs of VTE (edema of calf/thigh redness, pain)   Monitor for signs and symptoms of a pulmonary embolism (dyspnea, tachypnea, tachycardia, confusion)   Encourage/assist patient as needed to turn, cough, and perform deep breathing every 2 hours     Problem: Ineffective Gas Exchange  Goal: Effective breathing pattern  Outcome: Progressing  Flowsheets (Taken 10/16/2023 1420)  Effective breathing pattern:   Maintain CO2 level per LIP order   Monitor end tidal CO2 level per LIP order   Teach/reinforce use of ordered respiratory interventions (ie. CPAP, BiPAP, Incentive Spirometer, Acapella)     Problem: Impaired Mobility  Goal: Mobility/Activity is maintained at optimal level for patient  Outcome: Progressing  Flowsheets (Taken  10/16/2023 1420)  Mobility/activity is maintained at optimal level for patient:   Encourage independent activity per ability   Consult/collaborate with Physical Therapy and/or Occupational Therapy     Problem: Peripheral Neurovascular Impairment  Goal: Extremity color, movement, sensation are maintained or improved  Outcome: Progressing  Flowsheets (Taken 10/16/2023 1420)  Extremity color, movement, sensation are maintained or improved:   Assess and monitor application of corrective devices (cast, brace, splint), check skin integrity   Assess extremity for proper alignment     Problem: Compromised skin  integrity  Goal: Skin integrity is maintained or improved  Outcome: Progressing  Flowsheets (Taken 10/16/2023 1420)  Skin integrity is maintained or improved:   Assess Braden Scale every shift   Turn or reposition patient every 2 hours or as needed unless able to reposition self   Increase activity as tolerated/progressive mobility   Relieve pressure to bony prominences   Avoid shearing   Keep skin clean and dry   Encourage use of lotion/moisturizer on skin   Monitor patient's hygiene practices   Collaborate with Wound, Ostomy, and Continence Nurse   Utilize specialty bed   Keep head of bed 30 degrees or less (unless contraindicated)     Problem: Nutrition  Goal: Nutritional intake is adequate  Outcome: Progressing  Flowsheets (Taken 10/16/2023 1420)  Nutritional intake is adequate:   Consult/collaborate with Clinical Nutritionist   Consult/collaborate with Speech Therapy (swallow evaluations)     Problem: Droplet Isolation  Goal: Prevent transmission of other diagnosed infection while caring for patient in Droplet isolation  Outcome: Progressing  Flowsheets (Taken 10/16/2023 0212 by Keane Males, RN)  Is appropriate isolation sign and PPE outside Patient Room?: Yes  Provided Patient and family education: Verbal education  Strict Hand Hygiene: Yes  Donning and doffing PPE properly both Staff and Visitors: Yes  Multi-dose Medications stored in plastic bags?: Yes

## 2023-10-16 NOTE — Progress Notes (Signed)
 WARFARIN MANAGEMENT    Carl [name] is a 83 y.o. male presenting with PMH significant for  metastatic oropharyngeal carcinoma,COPD, atrial fibrillation (coumadin ), HFpEF, OSA, HTN and diabetes. He presents to the hospital 2/2 to rapid atrial fibrillation and decompensated heart failure .     Indication: stroke prevention secondary to atrial fibrillation   INR goal: 2 - 3    This is a continuation of home warfarin.  Warfarin counseling is deferred at this time as it is a home medication. Pharmacy will counsel if requested.  Warfarin dose PTA: 2.5mg  QD   Outpatient warfarin management team: [facility]     Recent Labs     10/16/23  0443 10/15/23  0341 10/14/23  0318   INR 1.9 2.4 2.8     Recent Labs     10/15/23  1344 10/15/23  1114 10/13/23  1755   Creatinine 0.9 0.9 0.9   BUN 26 27 24    Albumin   --   --  1.9*   AST (SGOT)  --   --  36   ALT  --   --  25   Bilirubin, Total  --   --  0.6     Estimated Creatinine Clearance: 60.6 mL/min (based on SCr of 0.9 mg/dL).    ASSESSMENT   INR is subtherapeutic at 1.9 this morning. The INR has decreased by 0.5 from yesterday's morning draw.  The patient is not indicated for bridging.  Potential drug-drug interactions include: Allopurinol  (increase INR)     PLAN   Given Subtherapeutic INR, will recommend to continue warfarin 1 mg.   The next INR level is ordered to be collected with AM labs.    Adult Inpatient Warfarin Dosing Guideline

## 2023-10-16 NOTE — Consults (Signed)
 Nutrition Consult  Reason for Consult: son has nutrition questions (per RN, appetite is really bad, and he was not passing all his food per speech)    Recommendations/Interventions:   Liberalized diet to regular NAS  Add oral nutrition supplement - Ensure Plus HP BID to optimize nutrition in the setting of malnutrition.   Encourage PO intake  Recommend MVI with minerals and 100 mg thiamine daily in s/o acute severe malnutrition    Medical and Nutrition Assessment: Marc Leichter is a(n) 83 y.o. 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  Patient also was found to have the flu. Otherwise patient is resting comforably on 2L o2.    Pt seen. NKFA. Reported poor PO intake with subsequent 50# weight loss since beginning of January. SLP recommends regular diet. No GI distress reported or difficulty chewing/swallowing, just has lost appetite and eating 50% or less than usual. Amenable to Ensure BID. Will continue to monitor.    Current Diet Order    Orders Placed This Encounter   Procedures    Adult diet Therapeutic/ Modified; Solid; Regular (IDDSI level 7); Thin (IDDSI level 0); Heart healthy     Allergies[1]    Diet intake: <50% of EER x 1 month    Anthropometrics:  Ht Readings from Last 1 Encounters:   10/09/23 1.651 m (5' 5)     Wt Readings from Last 1 Encounters:   10/16/23 76.9 kg (169 lb 9.6 oz)       **% weight change: 10.8% weight loss x 1 month = clinically significant   Weight Monitoring     Weight Weight Method   09/30/2022 94.348 kg     10/03/2022 94.802 kg     10/20/2022 97.342 kg     12/23/2022 96.616 kg     01/18/2023 95.255 kg     02/08/2023 97.07 kg     02/22/2023 95.255 kg     04/07/2023 95.8 kg     05/11/2023 96.163 kg     07/12/2023 96.616 kg     08/05/2023  92.987 kg     08/09/2023 89.812 kg     08/10/2023 91.354 kg     08/16/2023 88.361 kg     09/10/2023 86.093 kg  Stated     86.183 kg      86.183 kg     09/15/2023 82.056 kg     09/26/2023 83.371 kg     09/27/2023 83.28 kg     10/05/2023 81.375 kg     10/06/2023 81.194 kg     10/09/2023 81.647 kg  Stated     81.194 kg      81.194 kg     10/10/2023 77.1 kg  Bed Scale     77.1 kg  Stated    10/11/2023 77.1 kg  Bed Scale     77.1 kg  Stated    10/12/2023 78.744 kg     10/13/2023 76.567 kg  Standing Scale    10/14/2023 76.522 kg  Standing Scale     76.204 kg  Standing Scale    10/15/2023 78.608 kg  Standing Scale      Standing Scale    10/16/2023 76.93 kg  Standing Scale     76.93 kg  Standing Scale  Anthropometrics  Height: 165.1 cm (5' 5)  Weight: 76.9 kg (169 lb 9.6 oz)  Weight Change: 0  IBW/kg (Calculated) Male: 61.83 kg  IBW/kg (Calculated) Male: 56.81 kg  BMI (calculated): 28.2  Body mass index is 28.22 kg/m.    Recent Labs   Lab 10/16/23  1058 10/13/23  1755   Sodium 133* 137   Potassium 3.7 3.4*   Chloride 86* 88*   CO2 34* 35*   BUN 31* 24   Creatinine 0.9 0.9   Calcium  11.2* 10.8*   Albumin   --  1.9*   Protein, Total  --  5.8*   Bilirubin, Total  --  0.6   Alkaline Phosphatase  --  156*   ALT  --  25   AST (SGOT)  --  36   Glucose 297* 138*       Scheduled Meds:  Current Facility-Administered Medications   Medication Dose Route Frequency    albuterol -ipratropium  3 mL Nebulization Once    albuterol -ipratropium  3 mL Nebulization Q6H SCH    allopurinol   300 mg Oral Daily    atorvastatin   40 mg Oral Daily    budesonide -formoterol   2 puff Inhalation BID    dilTIAZem   60 mg Oral 4 times per day    furosemide   120 mg Intravenous BID    hydroCHLOROthiazide   25 mg Oral Daily    insulin  lispro  1-4 Units Subcutaneous QHS    insulin  lispro  1-8 Units Subcutaneous TID AC    isosorbide  mononitrate  30 mg Oral Daily    metoprolol  succinate  100 mg Oral Daily    senna  8.6 mg Oral QHS    tamsulosin   0.4 mg Oral Daily after dinner     warfarin  1 mg Oral Daily at 1800    warfarin   Oral See Admin Instructions     Patient Lines/Drains/Airways Status       Active PICC Line / CVC Line / PIV Line / Drain / Airway / Intraosseous Line / Epidural Line / ART Line / Line / Wound / Pressure Ulcer / NG/OG Tube       Name Placement date Placement time Site Days    Peripheral IV 10/12/23 22 G Anterior;Distal;Right Upper Arm 10/12/23  1054  Upper Arm  4    Chest Tube Left Pleural 09/19/23  0000  Pleural  27           Nutrition Focused Physical Exam (NFPE)  Head: temple region: slight depression with decrease in muscle tone/resistance (moderate muscle loss - temporalis), orbital region: slightly dark circles, somewhat hollow look, some decrease in bounce back of fat pads (moderate fat loss), and buccal region: slight depression, somewhat sunken appearance, flat cheeks, decrease in bounce back of fat pads (moderate fat loss)  Upper Body: clavicle bone region: some protrusion of the clavicle with decrease in muscle tone/resistance (mild muscle loss - pectoralis major), shoulder and acromion bone region: slight protrusion of acromion process, decrease in muscle tone/resistance (mild muscle loss - deltoid), upper arm region: some fat in pinch between fingers, but not ample (moderate fat loss), and dorsal hand region: slight depression, decrease in muscle tone/resistance (moderate muscle loss - interosseous)  Lower Body: no s/s subcutaneous fat or muscle loss   GI: constipated, LBM 2 days ago  Skin: Blanch redness, bruising and skin tears scattered per RN documentation  Edema: +3 BLE    Nutrition Diagnosis  Severe Malnutrition--related to inadequate protein energy intake in the  setting of acute illness as evidenced by intake < 50% of estimated energy requirements for > 5 days, 10.8% weight loss x 1 month, moderate muscle depletion (temporalis, interosseous), moderate depletion of subcutaneous fat loss (orbital fat pads, buccal fat pads).    Estimated Energy Needs:  based on CBW 76.9 kg  Calorie: 1700-2310 kcal/day (22-25 kcal/kg)  Protein: 77-93 gm/day (1-1.2 gm/kg)  Fluids per team    Nutrition Goals:   To consume at least 50% of meal trays and ONS    Monitor/Eval:  PO intake, Labs, GI distress, BMs, Weight trends         Hubert Fuchs, RDN  Spectra : [phone]  Dietitian Office: [phone]             [1]   Allergies  Allergen Reactions    Cephalexin Rash

## 2023-10-16 NOTE — Consults (Signed)
 [name] Note  Home Health Referral    Referral from (Case Manager) for home health care upon discharge.    By [employer] systems, the patient has the right to freely choose a home care provider.    A company of the patients choosing. We have supplied the patient with a listing of providers in your area who asked to be included and participate in Medicare.   [facility] a home care agency that provides adult home care services and participates in Medicare   The preferred provider of your insurance company. Choosing a home care provider other than your insurance company's preferred provider may affect your insurance coverage.      Home Health Discharge Information    Your doctor has ordered Skilled Nursing and Physical Therapy in-home service(s) for you while you recuperate at home, to assist you in the transition from hospital to home.    The agency that you or your representative chose to provide the service:  Name of Home Health Agency Placement: [facility]]  Phone: [phone]    [employer]:   ]  Equipment Ordered: Oxygen, Walker, Bedside Commode, and Nebulizer with supplies      The above services were set up by:  Carl Law, RN (Post Acute Care Coordinator)   Phone: [phone]       IF YOU HAVE NOT HEARD FROM YOUR HOME HEALTH AGENCY WITHIN 24-48 HOURS AFTER DISCHARGE PLEASE CALL YOUR AGENCY TO ARRANGE A TIME FOR YOUR FIRST VISIT. FOR ANY SCHEDULING CONCERNS OR QUESTIONS RELATED TO HOME HEALTH, SUCH AS TIME OR DATE PLEASE CONTACT YOUR HOME HEALTH AGENCY AT THE NUMBER LISTED ABOVE.    Additional comments:        START PATIENT REGISTRATION INFORMATION     Order Information  Order Signing Physician: Carl Damian ORN, MD    Service Ordered RN ?: Yes  Service Ordered PT ?: Yes  Service Ordered OT ?: No    Service Ordered ST ?: No    Service Ordered MSW?: No    Service Ordered HHA?: No    Following Physician: Carl Ip, MD   Following Physician Phone: [phone]    Overseeing Physician: N/A  (Required for Residents only)   Agreeable to Follow?: N/A  Spoke with: N/A  Date/Time of Call: 10/16/23 3:22 PM      Care Coordination   SOC Call from [facility] Required?: no  [facility]?: no  Primary Care Physician:Carl Lon, MD  Primary Care Physician [phone]  Primary Care Physician Address: [address] / [place] [address]  PCP NPI: [id number]  Visit Instructions: N/A  Service Discharge Location Type: Home  Service Facility Name: N/A  Service Floor Facility: N/A  Service Room No: N/A    Demographics  Patient Last Name: [name]   Patient First Name: Carl  Language/Communication Barrier: no  Service Address: [address]  [place] [address]   Service Home Phone: There is no home phone number on file.   Other phone numbers:    Telephone Information:   Mobile [phone]     Emergency Contact: Extended Emergency Contact Information  Primary Emergency Contact: Carl [name]  Address: [address]           [place], [place] [address] [place]  Mobile Phone: [phone]  Relation: Son  Interpreter needed? 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 [I50.33]     Caregiver Information  Caregiver First Name: N/A  Caregiver Last Name: N/A  Caregiver Relationship to Patient: Other  Caregiver Phone Number: N/A  Caregiver Notes: N/A            Data Processing Manager Information  Primary Subscriber:   Primary Subscriber Relation To Guarantor:   Primary Payor:   Primary Plan:   Primary Group #:    Primary Subscriber ID:    Primary Subscriber DOB:   Secondary Insurance Information  Secondary Subscriber:   Secondary Subscriber Relation To Guarantor:   Secondary Payor:   Secondary Plan:   Secondary Group #:   Secondary Subscriber ID:   Secondary Subscriber DOB:    HITECH  NO      END PATIENT REGISTRATION INFORMATION       Diagnosis: Influenza A [J10.1]  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 [I50.33]    [facility] Summary        [name]        Patient Name: Carl [name], Carl [name]     MRN: [id number]      CSN: [id number]         Account Information     Hosp Acct #   [id number] Patient Class   Inpatient Service  Medicine Accommodation Code  Semi-Private      Admission Information     Admitting Physician:  Attending Physician: Carl Donley FALCON, MD  Carl Damian ORN, MD Unit  FX [address] [facility]ASK[facility] L&D Status      Admitting Diagnosis: Influenza A; 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      Patient Information              Home Address: [address]  [place] [address] Employer:  Employer Address:       ,     Main Phone:   Employer Phone:     SSN: [id number]       DOB: 01/29/1941 (83 yrs)       Sex: Male Primary Care Physician: Carl Ip, MD   Marital Status: Widowed Referring Physician:       No ref. provider found   Race: White or Caucasian       Ethnicity: Not of Hispanic/Latino/S*       Emergency Contacts  Name Home Phone Work Phone Mobile Phone Relationship Carl [name]   [facility]     [phone] Son No         Guarantor Information     Guarantor Name: Carl [name], Carl [name] Guarantor ID: [id number]   Guarantor Relationship to Pt: Self Guarantor Type: Personal/Family   Guarantor DOB:    1941/06/29 Billing Indicator: [name]      Guarantor Address: [address]   [place], [place] [address]          Guarantor Home Phone:   Guarantor Employer:        Pensions Consultant Work Phone:   Pensions Consultant Emp Phone:  Primary Insurance      Insurance Name: [facility] PART A AND B Subscriber Name: Carl [name]   Insurance Address:    [address]  [place], [place]  [address] Subscriber DOB: 06/04/1941      Subscriber ID: [id number]   Insurance Phone:   Pt Relationship to Sub:   Self   Insurance ID:   Coverage Eff From: 09/05/2006   Group Name:   Preauthorization #: NPR   Group #:   Preauthorization Days:        Secondary Insurance     Insurance Name: OUT OF STATE BLUE CROSS-BCBS OUT OF STATE PPO Subscriber Name: Carl [name]   Insurance Address:    [address] [address]  [place]  [address] Subscriber DOB: April 18, 1941      Subscriber ID: [id number]   Insurance Phone:   Pt Relationship to Sub:   Self   Insurance ID:   Coverage Eff From: 09/05/2022   Group Name:   Preauthorization #:     Group #: [id number] Preauthorization Days:        [employer] Name: - Subscriber Name:     Community Education Officer Address:       ,   Statistician DOB:        Subscriber ID:     Press Photographer:   Pt Relationship to Sub:       Insurance ID:   Coverage Eff From:     Group Name:   Preauthorization #:     Group #:   Preauthorization Days:             Additional CommentsArtist   Home Health face-to-face (FTF) Encounter (Order [id number])  Consult  Date: 10/16/2023 Department: [facility] [place] [facility] [place] Ordering/Authorizing: Carl Damian ORN, MD     Order Information    Order Date/Time Release Date/Time Start Date/Time End Date/Time   10/16/23 02:54 PM None 10/16/23 02:55 PM 10/16/23 02:55 PM     Order Details    Frequency Duration Priority Order Class   Once 1  occurrence Routine Hospital Performed     Standing Order Information    Remaining Occurrences Interval Last Released     0/1 Once 10/16/2023              Provider Information    Ordering User Ordering Provider Authorizing Provider   Sanah Kraska, [place], RN Carl Damian ORN, MD Carl Damian ORN, MD   Attending Provider(s) Admitting Provider PCP    Carl Delanna LABOR, MD; Carl Donley FALCON, MD; Carl Damian ORN, MD Carl Donley FALCON, MD Carl Ip, MD     Verbal Order Info    Action Created on Order Mode Entered by Responsible Provider Signed by Signed on   Ordering 10/16/23 1454 Per protocol: cosign required [place], RN Carl Damian ORN, MD Carl Damian ORN, MD 10/16/23 1517           Comments    Home nursing required for skilled assessment including cardiopulmonary assessment and dietary education for disease management, and medication instruction. Home PT required for gait and balance training, strengthening, mobility, fall prevention, and ADL training.       Primary Diagnosis:  Influenza A [J10.1]  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 [I50.33]      Following Physician:  Carl Ip, MD  Address: [address] / [place] [address]  Phone: [phone]    Fax: [phone]  Home Health face-to-face (FTF) Encounter: Patient Communication     Not Released  Not seen         Order Questions    Question Answer   Date I saw the patient face-to-face: 10/16/2023   Evidence this patient is homebound because: E.  Requires assistance of another person or assistive device to ambulate > 5 feet   Medical conditions that necessitate Home Health care: B.  Functional impairment due to recent hospitalization/procedure/treatment   Per clinical findings, following services are medically necessary: PT    Skilled Nursing   Clinical findings that support the need for Skilled Nursing. SN will: C. Monitor for signs and symptoms of exacerbation of disease and management    D. Review medication reconciliation, manage and educate on use and side effects    I.  Educate dietary and or fluid restrictions and weight management    H. Assess cardiopulmonary status and monitor for signs &symptoms of exacerbation   Clinical findings that support the need for Physical Therapy. PT will A.  Evaluate and treat functional impairment and improve  mobility    B.  Educate on post-surgical care, restrictions and management of complications    C.  Educate on weight bearing status, stair/gait training, balance & coordination    D.  Provide services to help restore function, mobility, and releive pain    E.  Educate on functional mobility; bed, chair, sit, stand and transfer activities    F.  Perform home safety assessment & develop safe in home exercise program    H.  Educate on the safe use of assistive device/ durable medical equipment    G.  Implement activities to improve stance time, cadence & step length    I.  Instruct on restorative activities to restore ability to perform ADL    J.  Educate on wheelchair mobility, ramp negotiation, and therapeutic exercises                    Process Instructions    Please select Home Care Services medically necessary.    Based on the above findings, I certify that this patient is confined to the home and needs intermittent skilled nursing care, physical therapry and / or speech therapy or continues to need occupational therapy. The patient is under my care, and I have initiated the establishment of the plan of care. This patient will be followed by a physician who will periodically review the plan of care.     Collection Information            Consult Order Info    ID Description Priority Start Date Start Time   [id number] Home Health face-to-face (FTF) Encounter Routine 10/16/2023  2:55 PM   Provider Specialty Referred to   ______________________________________ _____________________________________         Acknowledgement Info    For At Acknowledged By Acknowledged On   Placing Order 10/16/23 1454 Cathryne Herter, RN 10/16/23 1457                     Verbal Order Info    Action Created on Order Mode Entered by Responsible Provider Signed by Signed on   Ordering 10/16/23 1454 Per protocol: cosign required Dayln [place], RN Carl Damian ORN, MD Carl Damian ORN, MD 10/16/23 1517           Patient  Information    Patient Name  Kenneth, Cuaresma Legal Sex  Male DOB  [birth year]  Reprint Order Requisition    Home Health face-to-face (FTF) Encounter (Order [id number]) on 10/16/23       Additional Information    Associated Reports External References   Priority and Order Details [employer]     Discharge Date:  10/17/23    Referral Source  Signed by: Carl Law, RN  Date Time: 10/16/23 3:22 PM      End PACC Note

## 2023-10-16 NOTE — Progress Notes (Signed)
 [facility] PROGRESS NOTE     Date Time:    10/16/23    3:52 PM  Patient Name: Carl [name]    LOS: 7 days     Subjective:    NAEON.  This PM, feels his breathing is better than we he came in.  No CP/pressure.     Scheduled Meds:   albuterol -ipratropium, 3 mL, Nebulization, Once  albuterol -ipratropium, 3 mL, Nebulization, Q6H SCH  allopurinol , 300 mg, Oral, Daily  atorvastatin , 40 mg, Oral, Daily  budesonide -formoterol , 2 puff, Inhalation, BID  dilTIAZem , 60 mg, Oral, 4 times per day  empagliflozin , 10 mg, Oral, Daily  furosemide , 120 mg, Intravenous, BID  hydroCHLOROthiazide , 25 mg, Oral, Daily  insulin  lispro, 1-4 Units, Subcutaneous, QHS  insulin  lispro, 1-8 Units, Subcutaneous, TID AC  isosorbide  mononitrate, 30 mg, Oral, Daily  metoprolol  succinate, 100 mg, Oral, Daily  senna, 8.6 mg, Oral, QHS  tamsulosin , 0.4 mg, Oral, Daily after dinner  warfarin, 1 mg, Oral, Daily at 1800  warfarin, , Oral, See Admin Instructions     Continuous Infusions:   PRN Meds: acetaminophen , benzocaine -menthol , benzonatate , carboxymethylcellulose sodium, dextrose  **OR** dextrose  **OR** dextrose  **OR** glucagon  (rDNA), magnesium  sulfate, melatonin, morphine  (concentrate), naloxone , potassium & sodium phosphates , potassium chloride  **OR** potassium chloride  **OR** potassium chloride , saline, senna    Objective:  Physical Exam:  BP 124/78   Pulse 91   Temp 97.7 F (36.5 C) (Oral)   Resp 18   Ht 1.651 m (5' 5)   Wt 76.9 kg (169 lb 9.6 oz)   SpO2 (!) 88%   BMI 28.22 kg/m      Intake/Output Summary (Last 24 hours) at 10/16/2023 0700  Last data filed at 10/16/2023 9376  Gross per 24 hour   Intake 1160 ml   Output 1647 ml   Net -487 ml     Wt Readings from Last 1 Encounters:   10/16/23 1311 76.9 kg (169 lb 9.6 oz)   10/16/23 0426 76.9 kg (169 lb 9.6 oz)   10/15/23 0452 78.6 kg (173 lb 4.8 oz)   10/14/23 1500 76.2 kg (168 lb)   10/14/23 0500 76.5 kg (168 lb 11.2 oz)   10/13/23 0846 76.6 kg (168 lb 12.8 oz)   10/12/23  0300 78.7 kg (173 lb 9.6 oz)   10/11/23 1700 77.1 kg (169 lb 15.6 oz)   10/11/23 0307 77.1 kg (169 lb 15.6 oz)   10/10/23 1800 77.1 kg (169 lb 15.6 oz)   10/10/23 0500 77.1 kg (169 lb 15.6 oz)   10/09/23 2330 81.2 kg (179 lb)   10/09/23 1146 81.2 kg (179 lb)     General: awake, alert, oriented x 3, no acute distress.  Cardiovascular: irregular rate and rhythm no murmurs, rubs or gallops     Lungs: decreased breath sounds at L lung base  Abdomen: soft, non-tender, non-distended, no hepatosplenomegaly, normoactive bowel sounds  Extremities: no clubbing, cyanosis, or edema     Lab Review:   No results for input(s): WBC, HGB, HCT, PLT in the last 72 hours.  No results for input(s): TROPI, ISTATTROPONI, CK, CKMB, BNP, DDIMER in the last 72 hours. Recent Labs     10/16/23  1058 10/15/23  1344 10/15/23  1114   Sodium 133* 133* 132*   Potassium 3.7 3.5 3.6   CO2 34* 34* 36*   BUN 31* 26 27   Creatinine 0.9 0.9 0.9   Glucose 297* 284* 306*    Estimated Creatinine Clearance: 60.6 mL/min (based on  SCr of 0.9 mg/dL).   Recent Labs     10/16/23  0443 10/15/23  0341 10/14/23  0318   INR 1.9 2.4 2.8        Telemetry: afib      Imaging:  Radiology Results (24 Hour)       Procedure Component Value Units Date/Time    VNA Fiberoptic Endo Eval w Speech Path [[id number]] Resulted: 10/16/23 1104    Order Status: Completed Updated: 10/16/23 1104    Narrative:      This procedure is auto-Finalized and is part of a Speech Therapy   procedure. The result for this exam is located on the Notes tab with the   title of SLP Eval Note of the same date on service.          Echo 09/15/23:  Summary    * Technically difficult echocardiogram due to poor acoustic windows.    * Contrast was administered to enable adequate wall motion interpretation.    * Left ventricular systolic function is hyperdynamic with an ejection  fraction by Biplane Method of Discs of  73 %.    * Left ventricular segmental wall motion is grossly normal, but  subtle wall  motion abnormalities cannot be excluded as technically limited study.    * Left ventricular diastolic function could not be assessed as this was a  technically limited study.    * Mildly decreased right ventricular systolic function.    * Evaluation of fibrosclerosis vs stenosis of the aortic valve cannot be  determined due to technical inability to capture the maximum Doppler  transaortic velocity.    * No pulmonary hypertension with estimated right ventricular systolic  pressure of  32 mmHg.    * Compared to the prior study dated 09/22/2022, RV function has decreased.    Assessment/Plan:   83 y.o. male with a PMHx HTN, HLD, DM type II, CAD s/p CABG, Afib (on coumadin ), HFpEF, OSA, COPD, ex-smoker, pleural effusions s/p thoracentesis and PleurX now removed, with positive pleural fluid for CEA positive adenocarcinoma, SCC of the lung via biopsy now readmitted with shortness of breath found to be flu + with afib and CHF.     #HFpEF   ProBNP 2400 on admission, negative trop. Most likely multifactorial from afib and flu infection. Sx likely combination of volume and flu. Has been diuresed with 120mg  IV bid lasix  over the week  - start 10mg  po qd Jardiance   - d/c IV lasix  120mg  bid; switch to 2mg  po bumex  bid  - d/c HCTZ  - K>4, Mg >2  - electrolyte repletion     #afib   Known hx of afib, will continue BB and warfarin. INR at goal today. Given active infection and heart failure exacerbation, will refrain from DCCV until both issues are treated.   - metoprolol  100 mg daily   - coumadin   - consolidate dilt to 240mg  daily (preserved LVEF)    #L pleural effusion, malignant      Selinda Mayans, MD, PhD, [facility]  Non-Invasive Cardiologist  [facility]  [facility]      [facility]   [facility] NP Spectralink [phone] or [phone] (M-F 8 am-5 pm)   [facility] Spectralink [phone]  (M-F 8 am-5 pm)  After Hours On-Call Physician: [phone]

## 2023-10-16 NOTE — Telephone Encounter (Signed)
Received a call from patient's daughter in law to report possible discharge from the hospital Tuesday.     He is scheduled to start C1 Wednesday. Relayed to Dr. Doristine Section. Will add a follow up appointment same day before infusion. Patient's family aware

## 2023-10-16 NOTE — PT Progress Note (Signed)
 Physical Therapy Treatment  [name]  Post Acute Care Therapy Recommendations:     Discharge Recommendations:  Home with supervision, Home with home health PT      DME needs IF patient is discharging home: Front wheel walker, BSC, Shower chair, Wheelchair-manual (wheelchair for longer distance needs)    Therapy discharge recommendations may change with patient status.  Please refer to most recent note for up-to-date recommendations.    Assessment:   Significant Findings: monitor SPO2/HR throughout, RN updated, vitals below.    Pt agreeable to PT. Son at bedside and educated on pt current level of mobility and level of assist required, son reports able to provide hands on assist at pt CLOF, plans to stay with pt to assist. Pt limited by DOE, decreased endurance/activity tolerance. Pt required minA with bed mobility for BLE elevation with return to bed, CGA with transfers, CGA with gait with RW short distance. Pt will continue to benefit from skilled physical therapy in the acute care setting to address noted limitations and progress towards PLOF.      Assessment: Decreased UE strength, Decreased LE strength, Decreased safety/judgement during functional mobility, Decreased endurance/activity tolerance, Decreased functional mobility, Decreased balance, Gait impairment  Progress: Progressing toward goals  Prognosis: Good, With continued PT status post acute discharge  Risks/Benefits/POC Discussed with Pt/Family: With patient/family (son at bedside)  Patient left without needs and call bell within reach. RN notified of session outcome.     Treatment Activities: bed mobility, sit <> stand, gait, balance, AD eval/training, ther ex, family training, energy conservations techniques, therapeutic activity, patient education    Educated the patient to role of physical therapy, plan of care, goals of therapy and HEP, safety with mobility and ADLs, energy conservation techniques, pursed lip breathing, home  safety.    Plan:   Treatment/Interventions: Exercise, Gait training, Neuromuscular re-education, Functional transfer training, LE strengthening/ROM, Endurance training, Patient/family training, Equipment eval/education, Compensatory technique education, Bed mobility        PT Frequency: 3-4x/wk     Continue plan of care.    Unit: [facility] ORIG BLDG 5 WEST  Bed: FO582/FO582.01     Precautions and Contraindications:   Precautions  Other Precautions: droplet; NC2L O2    Updated Medical Status/Imaging/Labs:   No results found.  Lab Results   Component Value Date/Time    HGB 12.9 10/10/2023 03:35 AM    HGB 14.1 12/23/2022 10:09 AM    HCT 38.4 10/10/2023 03:35 AM    HCT 41.4 12/23/2022 10:09 AM    K 3.5 10/15/2023 01:44 PM    K 4.7 02/08/2023 11:05 AM    NA 133 (L) 10/15/2023 01:44 PM    NA 142 02/08/2023 11:05 AM    INR 1.9 10/16/2023 04:43 AM    INR 4.0 (A) 10/07/2023 12:00 AM    INR 3.1 (H) 02/08/2023 11:05 AM    TROPI 17.5 10/09/2023 03:07 PM    TROPI 19.5 10/09/2023 02:11 PM    TROPI 13.0 10/09/2023 12:25 PM    TROPI 8.0 09/10/2023 07:46 PM       Subjective: He went down to the 80s when he was sleeping last night-per son, SPO2 dropped overnight, reports RN and MD is aware.    Patient Goal: To get better    Pain Assessment  Pain Assessment: No/denies pain          Patient's medical condition is appropriate for Physical Therapy intervention at this time.  Patient is agreeable to participation in  the therapy session. Nursing clears patient for therapy.    Objective:   Observation of Patient/Vital Signs:  Patient is in bed with PIV, NC2L in place  Pt wore mask during therapy session:No      Cognition/Neuro Status  Arousal/Alertness: Appropriate responses to stimuli  Attention Span: Appears intact  Orientation Level: Oriented X4  Following Commands: Follows all commands and directions without difficulty  Safety Awareness: minimal verbal instruction  Insights: Fully aware of deficits;Educated in safety  awareness  Behavior: calm;attentive;cooperative            Vitals: pt reports dyspnea on exertion and after mobility, improves with rest; RN updated    Sitting at rest  SpO2: 94 % on 2L via NC  SpO2: 94 % on RA   Sitting with exertion/seated there ex; pt reports 6/10 SOB using RPE  SpO2: 90-93 % on RA  HR: 100-121 bpm  Ambulation; pt reports 7/10 SOB using RPE                       SpO2: 89-91 % on  RA              HR:120s -135 bpm  After Activity, seated; pt reports 5/10 SOB using RPE                  SpO2: 91 % on RA              HR: 114 bpm  After Activity, seated; pt reports 3/10 SOB using RPE                  SpO2: 94 % on 2L via NC              HR: 106 bpm      Functional Mobility:  Sit to Supine: Minimal Assist (BLE elevation)  Sit to Stand: Contact Guard Assist  Stand to Sit: Contact Guard Assist  Minimal verbal, tactile cues for hand placement, appropriate use of RW, upright posture upon standing, reach back to control sit. Education on how family can assist.      Ambulation:  PMP - Progressive Mobility Protocol   PMP Activity: Step 6 - Walks in Room  Distance Walked (ft) (Step 6,7): 35 Feet (35)     Ambulation: Contact Guard Assist;with front-wheeled walker  Pattern: R foot decreased clearance;L foot decreased clearance;decreased cadence;decreased step length;Step through  Number of Stairs:  (pt reports no STE and stays on one level, declines need to complete stair training)     Minimal verbal/tactile cues for pacing, energy conservation, BORG RPE scale, appropriate use of RW, maintaining BOS within RW frame. No significant LOB/sway./knee buckle noted.    Educated on rollator vs RW; pt reports he prefers rollator and declines to use RW at home.     Therapeutic Exercise:   Seated: hip flexion, LAQ, ankle pump  Standing: marching in place. RW; CGA; decr. Hip flexion noted with fatigue. ~1-2 min.    Cues for pace, form, proper muscle recruitment, allowing for rest breaks as needed.    Participation and  Activity Tolerance:  Patient Participation: Good  Patient Endurance: Good    Patient left with call bell within reach, all needs met,   SCD's n/a  Floor mat not in place (as found)  Bed alarm on  and all questions answered. RN notified of session outcome and patient response.     Goals:  Goals  Goal Formulation: With patient  Time  for Goal Acheivement: 5 visits  Pt Will Go Supine To Sit: with supervision, to maximize functional mobility and independence  Pt Will Perform Sit to Stand: with supervision, to maximize functional mobility and independence  Pt Will Demo Standing Balance: 4/5 indep, moves/returns center of grav 1-2/1 plane, to maximize functional mobility and independence  Pt Will Transfer Bed/Chair: with supervision, with rolling walker, to maximize functional mobility and independence  Pt Will Ambulate: with supervision, with rolling walker, 51-100 feet, to maximize functional mobility and independence    PPE worn during session: gloves, procedural mask      Time of Treatment  PT Received On: 10/16/23  Start Time: 0930  Stop Time: 0957  Time Calculation (min): 27 min  Treatment # 1 out of 5 visits    Damien Canton, PT, DPT  Available via [employer]

## 2023-10-16 NOTE — Plan of Care (Signed)
 Problem: Moderate/High Fall Risk Score >5  Goal: Patient will remain free of falls  Outcome: Progressing  Flowsheets (Taken 10/15/2023 1935)  Moderate Risk (6-13):   MOD-include family in multidisciplinary POC discussions   MOD-Use gait belt when appropriate   MOD-Request PT/OT consult order for patients with gait/mobility impairment   MOD-Perform dangle, stand, walk (DSW) prior to mobilization   MOD-Re-orient confused patients   MOD-Utilize diversion activities   MOD-Place bedside commode and assistive devices out of sight when not in use   MOD-Remain with patient during toileting   MOD-Consider a move closer to Nurses Station   MOD-Floor mat at bedside (where available) if appropriate   MOD-Apply bed exit alarm if patient is confused   MOD-Consider activation of bed alarm if appropriate   LOW-Fall Interventions Appropriate for Low Fall Risk   LOW-Anticoagulation education for injury risk     Problem: Compromised Moisture  Goal: Moisture level Interventions  Outcome: Progressing  Flowsheets (Taken 10/15/2023 1935)  Moisture level Interventions: Moisture wicking products, Moisture barrier cream     Problem: Compromised Activity/Mobility  Goal: Activity/Mobility Interventions  Outcome: Progressing  Flowsheets (Taken 10/15/2023 1935)  Activity/Mobility Interventions: Pad bony prominences, TAP Seated positioning system when OOB, Promote PMP, Reposition q 2 hrs / turn clock, Offload heels     Problem: Everyday - Heart Failure  Goal: Stable Vital Signs and Fluid Balance  Outcome: Progressing  Flowsheets (Taken 10/15/2023 0249)  Stable Vital Signs and Fluid Balance:   Daily Standing Weights in the morning using the same scale, after using the bathroom and before breadfast.  If unable to stand, zero the bed and use the bed scale   Monitor labs and report abnormalities to physician   Strict Intake/Output   Monitor, assess vital signs and telemetry per policy   Wean oxygen as needed if appropriate   Assess for  swelling/edema  Goal: Mobility/Activity is Maintained at Optimal Level for Patient  Outcome: Progressing  Flowsheets (Taken 10/15/2023 0249)  Mobility/Activity is Maintained at Optimal Level for Patient:   Increase mobility as tolerated/progressive mobility protocol   Maintain SCD's as Ordered   Perform active/passive ROM   Assess for changes in respiratory status, level of consciousness and/or development of fatigue   Reposition patient every 2 hours and as needed unless able to reposition self   Consult with Physical Therapy and/or Occupational Therapy  Goal: Nutritional Intake is Adequate  Outcome: Progressing  Flowsheets (Taken 10/15/2023 0249)  Nutritional Intake is Adequate:   Consult/Collaborate with Nutritionist   Patient and family teaching on low sodium diet   Encourage/perform oral hygiene as appropriate   Assess appetite,anorexia and amount of meal/food tolerated  Goal: Teaching-Using CHF Warning Zones and Educational Videos  Outcome: Progressing  Flowsheets (Taken 10/15/2023 0249)  Teaching-Using CHF Warning Zones and Educational Videos:   Signs & Symptoms of CHF   Daily Standing Weights & record   Document in the Education Tab in EPIC with Teach-back   CHF Warning Zones and when to call for help   Medications   Sodium Restriction   Fluid Restriction if appropriate     Problem: Inadequate Gas Exchange  Goal: Adequate oxygenation and improved ventilation  Outcome: Progressing  Flowsheets (Taken 10/15/2023 0249)  Adequate oxygenation and improved ventilation:   Assess lung sounds   Monitor SpO2 and treat as needed   Teach/reinforce use of incentive spirometer 10 times per hour while awake, cough and deep breath as needed   Position for maximum ventilatory efficiency  Plan activities to conserve energy: plan rest periods   Increase activity as tolerated/progressive mobility   Consult/collaborate with Respiratory Therapy   Provide mechanical and oxygen support to facilitate gas exchange  Goal: Patent Airway  maintained  Outcome: Progressing  Flowsheets (Taken 10/15/2023 0249)  Patent airway maintained:   Position patient for maximum ventilatory efficiency   Provide adequate fluid intake to liquefy secretions   Suction secretions as needed   Reinforce use of ordered respiratory interventions (i.e. CPAP, BiPAP, Incentive Spirometer, Acapella, etc.)   Reposition patient every 2 hours and as needed unless able to self-reposition     Problem: Compromised Sensory Perception  Goal: Sensory Perception Interventions  Outcome: Progressing  Flowsheets (Taken 10/15/2023 1935)  Sensory Perception Interventions: Offload heels, Pad bony prominences, Reposition q 2hrs/turn Clock, Q2 hour skin assessment under devices if present     Problem: Compromised Friction/Shear  Goal: Friction and Shear Interventions  Outcome: Progressing  Flowsheets (Taken 10/15/2023 1935)  Friction and Shear Interventions: Pad bony prominences, Off load heels, HOB 30 degrees or less unless contraindicated, Consider: TAP seated positioning, Heel foams     Problem: Safety  Goal: Patient will be free from injury during hospitalization  Outcome: Progressing  Flowsheets (Taken 10/15/2023 0249)  Patient will be free from injury during hospitalization:   Assess patient's risk for falls and implement fall prevention plan of care per policy   Provide and maintain safe environment   Use appropriate transfer methods   Hourly rounding   Include patient/ family/ care giver in decisions related to safety   Ensure appropriate safety devices are available at the bedside   Provide alternative method of communication if needed (communication boards, writing)  Goal: Patient will be free from infection during hospitalization  Outcome: Progressing  Flowsheets (Taken 10/15/2023 0249)  Free from Infection during hospitalization:   Assess and monitor for signs and symptoms of infection   Monitor lab/diagnostic results   Monitor all insertion sites (i.e. indwelling lines, tubes, urinary  catheters, and drains)   Encourage patient and family to use good hand hygiene technique     Problem: Psychosocial and Spiritual Needs  Goal: Demonstrates ability to cope with hospitalization/illness  Outcome: Progressing  Flowsheets (Taken 10/15/2023 0249)  Demonstrates ability to cope with hospitalizations/illness:   Encourage verbalization of feelings/concerns/expectations   Provide quiet environment   Assist patient to identify own strengths and abilities   Reinforce positive adaptation of new coping behaviors   Encourage participation in diversional activity   Encourage patient to set small goals for self   Include patient/ patient care companion in decisions     Problem: Altered GI Function  Goal: Fluid and electrolyte balance are achieved/maintained  Outcome: Progressing  Flowsheets (Taken 10/15/2023 0249)  Fluid and electrolyte balance are achieved/maintained:   Monitor/assess lab values and report abnormal values   Observe for cardiac arrhythmias   Monitor for muscle weakness   Assess and reassess fluid and electrolyte status     Problem: Fluid and Electrolyte Imbalance/ Endocrine  Goal: Fluid and electrolyte balance are achieved/maintained  Outcome: Progressing  Flowsheets (Taken 10/15/2023 0249)  Fluid and electrolyte balance are achieved/maintained:   Monitor/assess lab values and report abnormal values   Observe for cardiac arrhythmias   Monitor for muscle weakness   Assess and reassess fluid and electrolyte status  Goal: Adequate hydration  Outcome: Progressing  Flowsheets (Taken 10/15/2023 0249)  Adequate hydration:   Assess mucus membranes, skin color, turgor, perfusion and presence of edema   Assess  for peripheral, sacral, periorbital and abdominal edema   Monitor and assess vital signs and perfusion     Problem: Nutrition  Goal: Nutritional intake is adequate  Outcome: Progressing  Flowsheets (Taken 10/15/2023 0249)  Nutritional intake is adequate:   Consult/collaborate with Speech Therapy (swallow  evaluations)   Consult/collaborate with Clinical Nutritionist     Problem: Diabetes: Glucose Imbalance  Goal: Blood glucose stable at established goal  Outcome: Progressing  Flowsheets (Taken 10/14/2023 1551 by Maia Eva Charleston, RN)  Blood glucose stable at established goal:   Monitor lab values   Monitor intake and output.  Notify LIP if urine output is < 30 mL/hour.   Follow fluid restrictions/IV/PO parameters   Assess for hypoglycemia /hyperglycemia   Coordinate medication administration with meals, as indicated   Ensure adequate hydration   Ensure appropriate diet and assess tolerance   Monitor/assess vital signs   Include patient/family in decisions related to nutrition/dietary selections   Ensure appropriate consults are obtained (Nutrition, Diabetes Education, and Case Management)   Ensure patient/family has adequate teaching materials     Problem: Hemodynamic Status: Cardiac  Goal: Stable vital signs and fluid balance  Outcome: Progressing  Flowsheets (Taken 10/15/2023 0249)  Stable vital signs and fluid balance:   Assess signs and symptoms associated with cardiac rhythm changes   Monitor lab values     Problem: Inadequate Tissue Perfusion  Goal: Adequate tissue perfusion will be maintained  Outcome: Progressing  Flowsheets (Taken 10/14/2023 1551 by Maia Eva Charleston, RN)  Adequate tissue perfusion will be maintained:   Monitor/assess lab values and report abnormal values   Monitor/assess neurovascular status (pulses, capillary refill, pain, paresthesia, paralysis, presence of edema)   Monitor/assess for signs of VTE (edema of calf/thigh redness, pain)   Monitor for signs and symptoms of a pulmonary embolism (dyspnea, tachypnea, tachycardia, confusion)   Encourage/assist patient as needed to turn, cough, and perform deep breathing every 2 hours     Problem: Ineffective Gas Exchange  Goal: Effective breathing pattern  Outcome: Progressing  Flowsheets (Taken 10/14/2023 1551 by Maia Eva Charleston,  RN)  Effective breathing pattern:   Maintain CO2 level per LIP order   Monitor end tidal CO2 level per LIP order   Teach/reinforce use of ordered respiratory interventions (ie. CPAP, BiPAP, Incentive Spirometer, Acapella)     Problem: Impaired Mobility  Goal: Mobility/Activity is maintained at optimal level for patient  Outcome: Progressing  Flowsheets (Taken 10/14/2023 1551 by Maia Eva Charleston, RN)  Mobility/activity is maintained at optimal level for patient:   Encourage independent activity per ability   Consult/collaborate with Physical Therapy and/or Occupational Therapy     Problem: Peripheral Neurovascular Impairment  Goal: Extremity color, movement, sensation are maintained or improved  Outcome: Progressing  Flowsheets (Taken 10/16/2023 0212)  Extremity color, movement, sensation are maintained or improved: Assess extremity for proper alignment     Problem: Compromised skin integrity  Goal: Skin integrity is maintained or improved  Outcome: Progressing  Flowsheets (Taken 10/14/2023 1551 by Maia Eva Charleston, RN)  Skin integrity is maintained or improved:   Assess Braden Scale every shift   Turn or reposition patient every 2 hours or as needed unless able to reposition self   Increase activity as tolerated/progressive mobility   Relieve pressure to bony prominences   Avoid shearing   Keep skin clean and dry   Monitor patient's hygiene practices   Collaborate with Wound, Ostomy, and Continence Nurse   Utilize specialty bed   Keep head of  bed 30 degrees or less (unless contraindicated)     Problem: Nutrition  Goal: Nutritional intake is adequate  Outcome: Progressing  Flowsheets (Taken 10/15/2023 0249)  Nutritional intake is adequate:   Consult/collaborate with Speech Therapy (swallow evaluations)   Consult/collaborate with Clinical Nutritionist     Problem: Droplet Isolation  Goal: Prevent transmission of other diagnosed infection while caring for patient in Droplet isolation  Outcome:  Progressing  Flowsheets (Taken 10/16/2023 0212)  Is appropriate isolation sign and PPE outside Patient Room?: Yes  Provided Patient and family education: Verbal education  Strict Hand Hygiene: Yes  Donning and doffing PPE properly both Staff and Visitors: Yes  Multi-dose Medications stored in plastic bags?: Yes

## 2023-10-16 NOTE — Malnutrition Assessment (Signed)
 Carl [name] is a 83 y.o. male patient.   [id number]    Malnutrition Assessment   Malnutrition Documentation    Severe Malnutrition--related to inadequate protein energy intake in the setting of acute illness as evidenced by intake < 50% of estimated energy requirements for > 5 days, 10.8% weight loss x 1 month, moderate muscle depletion (temporalis, interosseous), moderate depletion of subcutaneous fat loss (orbital fat pads, buccal fat pads).       Hubert Fuchs, RDN      If physician disagrees with this assessment see addendum.

## 2023-10-16 NOTE — Progress Notes (Addendum)
 LOS # 7      Summary of Discharge Plan: Home w/ HHPT      Identified Possible Discharge Barriers: Waiting medical clearance      CM Interventions and Outcome:      Discussed above Discharge Plan with (patient, family, Care Team, others): Pt, family, care team    Chart reviewed. [facility] rounds completed w/ MD and RN. Family was at bedside during rounds. Per MD, Pt will [place] tomorrow. Pt Lasix  was increased to 120mg . Family is requesting O2 and nebulizer for Pt to [place] home with. Per family, Pt O2 dropped below 88% last night. Before admission, Pt was receiving HH services [facility]. Family would like for Pt to resume care w/ [facility].    [facility] order placed for PT, SN, O2, nebulizer, bsc, and wheelchair. Secure chat sent to [facility] RN to notify of order.    Case Management will continue to follow on patient's discharge needs.    Candelaria Slocumb, MSW, Supervisee in Social Work  [employer]

## 2023-10-16 NOTE — SLP Eval Note (Signed)
 [facility]   Speech and Language Pathology  Flexible Endoscopic Evaluation of Swallowing    Patient: Rosemary Pentecost    MRN#: [id number]   Unit: [facility] ORIG BLDG 5 WEST  Room: FO582/FO582.01    Plan/Recommendations:   Solid Recommendations: Regular Solids (RG7)  Liquid Recommendations: Thin Liquids (IDDSI TN0) via, any modality  Recommended Form of Meds: PO, whole, with liquid   Precautions/Compensations: small bites/sips, slowed rate of intake, upright during and after meals  Supervision: Close supervision    Referrals: Registered Dietician    Plan of care: begin/continue oral diet, precautions and strategy training, dysphagia treatment, and patient/family education  Frequency Recommended: 1-2x/wk  Discharge recommendations: Outpatient speech    Assessment:   Auron Tadros presents with mild-moderate oropharyngeal dysphagia c/b significantly prolonged mastication with regular solids, slowed oral transit with puree, and moderate-severe pharyngeal residue with puree/solids (partially cleared with liquid wash). No aspiration seen across consistencies; however suspect reduced oral intake and reduced ability to meet nutrition/hydration needs given severity of pharyngeal residue.     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 Nursing clears patient for therapy. Son present for session.  Patient Status/Behavior: patient is alert, oriented x3, and is able to follow simple commands    PAIN: no     Objective:   Patient Status:    - Diet Prior to Study: Regular solids (RG7) and Thin liquids (TN0)   - Position: in bed   -  Medical equipment in place: telemetry and IV   - Respiratory Status:  3 L O2 via NC    - Interpreter services: n/a   - Precautions: droplet     Vitals  Pre-procedure vitals/labs:  HR: 103  O2: 93  RR: 21       Post-procedure vitals:   HR: 103  O2: 95  RR: 23    Speech and Voice Related Tasks  Velopharyngeal Closure: patient unable to complete volitional task  Base of Tongue Retraction: Reduced  Pharyngeal Wall Medialization: patient unable to complete volitional task    Vocal fold Adduction: incomplete  Vocal fold Abduction: incomplete  Arytenoid Movement: asymmetrical (movement of L arytenoid > R arytenoid)   Vocal Quality: breathy  and hoarse, judged severity: moderate    [place] Secretion Scale (NZSS) 0-7:    Location: 0 - Nil significant pooled secretions in the pyriform fossae or laryngeal vestibule  Amount: 0 - Nil significant pooled secretions in pyriform fossae (0-20%)  Response: N/A - Normal airway responses in the pharynx or laryngeal vestibule may include spontaneous coughing, throat clearing, or swallowing   Total Score: 0    Anatomical Visual Inspection  Base of tongue: WFL  Valleculae: WFL  Epiglottis: edema  Arytenoids: edema  False Vocal Folds: edema  Vocal folds: erythema, edema  Pyriform Sinus: WFL  *Residue from breakfast visible in pharynx, cleared with liquid wash during study        Oral Phase  Adequate labial seal for bolus acceptance/containment  Slowed oral transit with puree  Mastication of single bite of solids lasting 30-40 seconds  Piecemeal deglutition  Pharyngeal Phase     Oral Trials via: Position of bolus pre-swallow:  Pen-Asp Scale: Vallecular Residue:  [place] Pharyngeal Residue Severity Score Pyriform Residue:  [place] Pharyngeal Residue Severity Score   Ice Chips Spoon   pyriform 2: Material enters in airway, remains above vocal folds, and is ejected from airway.  N/A  N/A   Thin (TN0) Spoon  Cup  Straw pyriform 3: Material enters in airway, remains above vocal folds, and is not  ejected from airway. moderate, 25-50% epiglottic ligament covered trace, 1-5% trace coating of mucosa   Mildly thick liquids (MT2) Spoon  Cup  Straw lateral channels 2: Material enters in airway, remains above vocal folds, and is ejected from airway. mild, 5-25% epiglottic ligament visible mild, 5-25% up wall to quarter full   Pureed (PU4) Spoon base of tongue 1: Material does not enter airway. severe, >50% filled to epiglottic rim mild, 5-25% up wall to quarter full   Solids (RG7)   N/A base of tongue 1: Material does not enter airway. moderate, 25-50% epiglottic ligament covered none     Pharyngeal Biomechanics:   -Reduced base of tongue retraction resulting in moderate-severe residue in the valleculae with puree/solids  -Reduced laryngeal vestibule closure with trace penetration of thin liquids remaining in LV. Visualized epiglottic inversion, suspect incomplete. Suspect incomplete hyolaryngeal elevation  -Reduced pharyngeal constriction/shortening resulting in diffuse trace residue along the posterior pharyngeal wall and along the lateral channels  -Mildly reduced UES opening    Dysphagia Outcome Severity Scale (DOSS):  Level 4: Mild-moderate dysphagia: Intermittent supervision/cueing, one or two consistencies restricted     Compensatory strategies  Effective strategies: liquid assist (partially effective)  Ineffective strategies: n/a      FEES Procedure:  Informed consent obtained with witness present. Boarding pass signed.      Flexible endoscope passed through the left nare without adverse effects or complication during or immediately after completion of exam.     Patient was educated to role of speech therapy, plan of care, goals of therapy and recommendations.     Patient left with call bell within reach, all needs met, fall mat in place and all questions answered. RN notified of session outcome and patient response.     Goals:   Pt will participate in instrumental assessment of swallow to inform  recommendations x1. MET    Present: Morna Devonshire, CARILYN., SLP Graduate Student      Provider: Newell Pottier M.A., CCC-SLP   PPE worn by provider:  procedural mask and gloves    Assistant: Rehab Tech: Abby Cong    PPE worn by assistant: procedural mask and gloves    Time of Treatment:  SLP Received On: 10/16/23  Start Time: 1110  Stop Time: 1145  Time Calculation (min): 35 min

## 2023-10-16 NOTE — Discharge Instr - AVS First Page (Addendum)
 Reason for your Hospital Admission:    You were admitted with shortness of breath. This was due to a combination of influenza and excess fluid from heart failure which improved with a few doses of IV diuretic (similar to the water  pill you use at home).        Home Health Discharge Information     Your doctor has ordered Skilled Nursing and Physical Therapy in-home service(s) for you while you recuperate at home, to assist you in the transition from hospital to home.     The agency that you or your representative chose to provide the service:  Name of Home Health Agency Placement: [facility]]  Phone: [phone]     [employer]:   ]  Equipment Ordered: Oxygen, Walker, Bedside Commode, and Nebulizer with supplies        The above services were set up by:  Gerard Law, RN (Post Acute Care Coordinator)   Phone: [phone]         IF YOU HAVE NOT HEARD FROM YOUR HOME HEALTH AGENCY WITHIN 24-48 HOURS AFTER DISCHARGE PLEASE CALL YOUR AGENCY TO ARRANGE A TIME FOR YOUR FIRST VISIT. FOR ANY SCHEDULING CONCERNS OR QUESTIONS RELATED TO HOME HEALTH, SUCH AS TIME OR DATE PLEASE CONTACT YOUR HOME HEALTH AGENCY AT THE NUMBER LISTED ABOVE.     Additional comments:

## 2023-10-16 NOTE — Progress Notes (Addendum)
 Brief background:  83 y/o M, presented with SOB was found to be in CHF exacerbation with underlying A-fib with RVR. Patient also was found to have the flu.   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     Last 24 Hrs: PRN morphine  2mg  oral solution given for work of breathing/dyspnea, pt stated it helps a lot.Pt denies chest pain, HA/dizziness or N/V. Schedule medications given as order. Provided pt & his son education on strict I's & Os & daily weights, both verbalized understanding. Safety & fall precautions in placed. Call bell within pt reach. Purposeful rounding done. Isolation precautions maintained.     LDA (drains, central lines, foley, & the indication): Pleurex cath in place, 22G RUA     Discharge barriers: Medical clearance     Abnormal labs/VS (needing any orders): PT-21.6, INR-1.9     Respiratory (changes in oxygenation): Pt on 2L sating at 95-96%, SOB present. Reinforced pt teaching on IS & encouraged pt to use it more often. Rhonchi noted.      GI/GU (changes in output, BM, foley): cont x 2. Last BM 2/5, senna docusate given to pt.     Neuro changes: AxOx4     Nutrition: Heart Healthy diet     Activity/ Safety/ Mobility questions (need for PT/OT, sitter): x1 person assist & a walker. Provided pt & his son education on safety & fall precautions, both verbalized understanding.     Tele/ Rhythm (changes?): Afib on tele     Skin/ Wounds: Scattered (bruising, scaling & scars),  Abrasions(B/l hands), Cracking(BLE, heels), Blanchable redness(elbows, sacrum,buttocks, gluteal cleft, back, b/l plantar feet & heels), +3 pitting edema on both feet    VTE prophylaxis: Warfarin. Ambulation.     Upcoming Procedures: TBD     Case management needs: TBD     Discharge plan: TBD     Plans: *Continue IV lasix . Strict I's & O's, daily weights              * Neb treatment              * Tele monitoring              *Onco & cardiology  following              *Thoracic surgery recommend decreasing pleurx drainage to once a week (due next Wednesday)              *Continue to wean oxygen as tolerated               *to start chemotherapy and immunotherapy in the coming weeks              *follow up with Dr. Maree on 2/27 to discuss possible pleurx removal if drainage remains low

## 2023-10-16 NOTE — Progress Notes (Signed)
 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      Patient Information              Home Address: [address]  [place] [address] Employer:  Employer Address:       ,     Main Phone:   Employer Phone:     SSN: [id number]       DOB: 11-02-1940 (83 yrs)       Sex: Male Primary Care Physician: Lon Ip, MD   Marital Status: Widowed Referring Physician:       No ref. provider found   Race: White or Caucasian       Ethnicity: Not of Hispanic/Latino/S*       Emergency Contacts  Name Home Phone Work Phone Mobile Phone Relationship Tamsen Pickler   [facility]     [phone] Son No         Guarantor Information     Guarantor Name: [name], [name] Guarantor ID: [id number]   Guarantor Relationship to Pt: Self Guarantor Type: Personal/Family   Guarantor DOB:    01-17-1941 Billing Indicator: [name]      Guarantor Address: [address]   [place], [place] [address]          Guarantor Home Phone:   Guarantor Employer:        Guarantor Work Phone:   Pensions Consultant Emp Phone:                     Chief Strategy Officer Name: [name] Subscriber Name: [name]   Insurance Address:    [address]  [place], [place]  [address] Subscriber DOB:  [birth year]      Subscriber ID: [id number]   Insurance Phone:   Pt Relationship to Sub:   Self   Insurance ID:   Coverage Eff From: 09/05/2006   Group Name:   Preauthorization #: NPR   Group #:   Preauthorization Days:        Secondary Insurance     Insurance Name: [name], [name] Subscriber Name: [name]   Insurance Address:    [address] [address]  [place]  [address] Subscriber DOB: 1940-10-15      Subscriber ID: [id number]   Insurance Phone:   Pt Relationship to Sub:   Self   Insurance ID:   Coverage Eff From: 09/05/2022   Group Name:   Preauthorization #:     Group #:  [id number] Preauthorization Days:      Home Health face-to-face (FTF) Encounter (Order [id number])  Consult  Date: 10/16/2023 Department: [facility] Orig [address]ld[address] [place] Ordering/Authorizing: Venora Damian ORN, MD     Order Information    Order Date/Time Release Date/Time Start Date/Time End Date/Time   10/16/23 03:08 PM None 10/16/23 03:03 PM 10/16/23 03:03 PM     Order Details    Frequency Duration Priority Order Class   Once 1  occurrence Routine Hospital Performed     Standing Order Information    Remaining Occurrences Interval Last Released     0/1 Once 10/16/2023              Provider Information    Ordering User Ordering Provider Authorizing Provider   Long, [place], RN Venora Damian ORN, MD Venora Damian ORN, MD   Attending Provider(s) Admitting Provider PCP   Sherrilee Delanna LABOR, MD; Jackson Donley FALCON, MD; Venora Damian ORN, MD Jackson Donley FALCON, MD Lon Ip, MD     Verbal Order Info    Action Created on Order Mode Entered by Responsible Provider Signed by Signed on   Ordering 10/16/23 1508 Telephone with doyal Darra Sayres, RN Venora Damian ORN, MD Venora Damian ORN, MD 10/16/23 1517           Comments    Front wheel walker needed >99 months, NPI: [id number]    Bedside commode needed >99 months, NPI: [id number]    Standard wheelchair needed >99 months, NPI: [id number]    Nebulizer needed  >99 months, NPI: [id number]    Nebulizer medications: albuterol -ipratropium (DUO-NEB) 2.5-0.5(3) mg/3 mL nebulizer 3 mL     3 mL RT - Every 6 hours scheduled or as needed      Ht: 1.651 m (5' 5)  Wt: 76.9 kg (169 lb 9.6 oz)    Primary Diagnosis:  Influenza A [J10.1]  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  N/A DME only    Medical conditions that necessitate Home Health care: M.  N/A DME only. No skilled services needed    Per clinical findings, following services are medically necessary: DME    DME Educational Psychologist                     Process Instructions    Please select Home Care Services medically necessary.    Based on the above findings, I certify that this patient is confined to the home and needs intermittent skilled nursing care, physical therapry and / or speech therapy or continues to need occupational therapy. The patient is under my care, and I have initiated the establishment of the plan of care. This patient will be followed by a physician who will periodically review the plan of care.     Collection Information            Consult Order Info  ID Description Priority Start Date Start Time   [id number] Home Health face-to-face (FTF) Encounter Routine 10/16/2023  3:03 PM   Provider Specialty Referred to   ______________________________________ _____________________________________         Acknowledgement Info    For At Acknowledged By Acknowledged On   Placing Order 10/16/23 1508 Cathryne Herter, RN 10/16/23 1509                     Verbal Order  Info    Action Created on Order Mode Entered by Responsible Provider Signed by Signed on   Ordering 10/16/23 1508 Telephone with doyal Darra Sayres, RN Venora Damian ORN, MD Venora Damian ORN, MD 10/16/23 1517           Patient Information    Patient Name  Carl [name] Legal Sex  Male DOB  Dec 10, 1940       Reprint Order Requisition    Home Health face-to-face (FTF) Encounter (Order [id number]) on 10/16/23       Additional Information    Associated Reports External References   Priority and Order Details [facility]   Venora Damian ORN, MD   Physician  Internal Medicine     Progress Notes     Addendum     Date of Service: 10/16/2023  8:18 AM  Creation Time: 10/16/2023  8:18 AM     Addendum         MEDICINE PROGRESS NOTE     Date Time: 10/16/23 8:18 AM  Patient Name: Carl [name]  Attending Physician: Venora Damian ORN, MD     Assessment:   Patient is an 83 year old male with history of CHF, atrial fibrillation on Eliquis, 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  Patient also was found to have the flu. Otherwise patient is resting comforably on 2L o2.      Plan:       #Acute on chronic diastolic heart failure:   -Continue home thiazide-substitute HCTZ for his chlorthalidone   -incerased lasix  to 120 mg BID as discussed with cardiology   -daily weights, I/O  -long term telemetry        #SOB  --remains short of breath, this is likely from tumor burden  however could possibly be from afib?  --consulted cadiology   --Cont continue to diurese as above  -- Continue to wean oxygen as tolerated  -- Discussed with IR for troubleshoot Pleurx as has not been drained for 4 days  -- Messaged oncology given findings on CT scan that shows mass in the lung.  --Liquid Morphine  2mg , PRN for Shortness of breath - this seems to  help significantly over night.   ==f/u SLP     #Atrial fibrillation with RVR  -resume home metoprolol  100mg  daily  -should be safe for diltiazem  if this is needed  -Long term telemetry as above  -- Continue dilt to 60mg  TID   --Cadiology consulted.   -takes coumadin -unclear why this medication but long standing, managing as below     #Coumadin  management:    f/u INR improved, pharmacy to dose.      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  Noted the symptoms are likely due to psychosomatic reasons such as anxiety.  We discussed how palliative care could provide ways to alleviate some of the symptoms.  --f/u Palliative care.         #Oropharyngeal cancer:  he is to start chemotherapy and immunotherapy in the coming weeks; has not had any treatments thus far     Disposition: ordered nutrition consult for him per his request. Patient can be discharged tomorrow if he is feeling well. F/u with cardiology regarding diuretics plan.        Carl [name] is confined to a room without a bathroom and level confined without a bathroom. Carl [name]'s home provides adequate access between rooms and can maneuvering throughout the home's space. The home also has surfaces for use of the manual wheelchair patient has not expressed an unwillingness to use the manual wheelchair  that is provided in the home and has sufficient upper extremity function along with physical and mental capabilities needed to safely self-propel the manual wheelchair that is provided in the home during a typical day. 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 he cannot ambulate more than 100 feet with a walker, cane, or crutch. As well as being unable to safely perform ADLs in the home, he is able to self-propel.      Chemical DVT ppx  FULL code  Telemetry needs:  long term as above  Case discussed with: patient      Safety Checklist:      DVT prophylaxis:  CHEST guideline (See page [id number]) Chemical   Foley:  Sunflower Rn Foley protocol Not present   IVs:  Peripheral IV   PT/OT: Ordered   Daily CBC & or Chem ordered:  SHM/ABIM guidelines (see #5) Yes, due to clinical and lab instability   Reference for approximate charges of common labs: CBC auto diff - $76  BMP - $99  Mg - $79     Lines:      Patient Lines/Drains/Airways Status         Active PICC Line / CVC Line / PIV Line / Drain / Airway / Intraosseous Line / Epidural Line / ART Line / Line / Wound / Pressure Ulcer / NG/OG Tube         Name Placement date Placement time Site Days     Peripheral IV 10/09/23 18 G Standard Right Antecubital 10/09/23  1222  Antecubital  less than 1                          Disposition: (Please see PAF column for Expected D/C Date)   Today's date: 10/16/2023  Admit Date: 10/09/2023 11:58 AM  LOS: 7  Clinical Milestones: Afiv RVR   Anticipated discharge ne:         Subjective      CC: 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  Feeling better today  NAD  NAEON  Rest of ROS same as HPI      Review of Systems:      As per HPI     Physical Exam:      VITAL SIGNS PHYSICAL EXAM   Temp:  [97.3 F (36.3 C)-98.2 F (36.8 C)] 97.3 F (36.3 C)  Heart Rate:  [88-113] 91  Resp Rate:  [16-21] 16  BP: (94-148)/(60-84)  120/75  Blood Glucose:           Intake/Output Summary (Last 24 hours) at 10/16/2023 0818  Last data filed at 10/16/2023 0729      Gross per 24 hour   Intake 1160 ml   Output 1847 ml   Net -687 ml    Physical Exam  General:Aaox3,NAD   Cardiovascular: regular rate and rhythm, no murmurs, rubs or gallops  Lungs: Decreased breath sounds on theright lower field.   Abdomen: soft, non-tender, non-distended; no palpable masses,  normoactive bowel sounds  Extremities: 2+ edema           Meds:      Medications were reviewed:         Current Facility-Administered Medications   Medication Dose Route Frequency    albuterol -ipratropium  3 mL Nebulization Once    albuterol -ipratropium  3 mL Nebulization Q6H SCH    allopurinol   300 mg Oral Daily    atorvastatin   40 mg Oral Daily    budesonide -formoterol   2 puff Inhalation BID    dilTIAZem   60 mg Oral 4 times per day    furosemide   120 mg Intravenous BID    hydroCHLOROthiazide   25 mg Oral Daily    insulin  lispro  1-4 Units Subcutaneous QHS    insulin  lispro  1-8 Units Subcutaneous TID AC    isosorbide  mononitrate  30 mg Oral Daily    metoprolol  succinate  100 mg Oral Daily    senna  8.6 mg Oral QHS    warfarin  1 mg Oral Daily at 1800    warfarin   Oral See Admin Instructions      [Infusion Meds]    [Infusion Meds]         Current Facility-Administered Medications   Medication Dose Route Frequency Last Rate     [PRN Medications]    [PRN Medications]        Current Facility-Administered Medications   Medication Dose Route    acetaminophen   650 mg Oral    benzocaine -menthol   1 lozenge Buccal    benzonatate   100 mg Oral    carboxymethylcellulose sodium  1 drop Both Eyes    dextrose   15 g of glucose Oral     Or    dextrose   12.5 g Intravenous     Or    dextrose   12.5 g Intravenous     Or    glucagon  (rDNA)  1 mg Intramuscular    magnesium  sulfate  1 g Intravenous    melatonin  3 mg Oral    morphine  (concentrate)  2 mg Oral    naloxone   0.2 mg Intravenous    potassium & sodium phosphates    2 packet Oral    potassium chloride   0-60 mEq Oral     Or    potassium chloride   0-60 mEq Oral     Or    potassium chloride   10 mEq Intravenous    saline  2 spray Each Nare    senna  8.6 mg Oral  Labs:      Labs (last 72 hours):          Recent Labs   Lab 10/10/23  0335 10/09/23  1225   WBC 16.04* 16.71*   Hemoglobin 12.9 12.8   Hematocrit 38.4 38.9   Platelet Count 234 271              Recent Labs   Lab 10/16/23  0443 10/15/23  0341   PT 21.6* 27.0*   INR 1.9 2.4            Recent Labs   Lab 10/15/23  1344 10/15/23  1114 10/13/23  1755 10/10/23  0335 10/09/23  1225   Sodium 133* 132* 137  More results in Results Review 140   Potassium 3.5 3.6 3.4*  More results in Results Review 3.5   Chloride 88* 86* 88*  More results in Results Review 97*   CO2 34* 36* 35*  More results in Results Review 34*   BUN 26 27 24   More results in Results Review 25   Creatinine 0.9 0.9 0.9  More results in Results Review 0.8   Calcium  10.6* 10.7* 10.8*  More results in Results Review 10.9*   Albumin   --   --  1.9*  --  2.0*   Protein, Total  --   --  5.8*  --  6.0   Bilirubin, Total  --   --  0.6  --  0.5   Alkaline Phosphatase  --   --  156*  --  158*   ALT  --   --  25  --  27   AST (SGOT)  --   --  36  --  36   Glucose 284* 306* 138*  More results in Results Review 136*   More results in Results Review = values in this interval not displayed.                         Microbiology, reviewed and are significant for:  Microbiology Results (last 15 days)         Procedure Component Value Units Date/Time     COVID-19 and Influenza ([place]) (symptomatic) [[id number]]  (Abnormal) Collected: 10/09/23 1228     Order Status: Completed Specimen: Swab from Anterior Nares Updated: 10/09/23 1327       SARS-CoV-2 (COVID-19) RNA Not Detected       Influenza A RNA Detected       Influenza B RNA Not Detected     Narrative:       A result of Detected indicates POSITIVE for the presence of viral RNA  A result of Not Detected indicates NEGATIVE  for the presence of viral RNA     Test performed using the Roche cobas [place] SARS-CoV-2 & Influenza A/B assay. This is a multiplex real-time RT-PCR assay for the detection of SARS-CoV-2, influenza A, and influenza B virus RNA. Viral nucleic acids may persist in vivo, independent of viability. Detection of viral nucleic acid does not imply the presence of infectious virus, or that virus nucleic acid is the cause of clinical symptoms. Negative results do not preclude SARS-CoV-2, influenza A, and/or influenza B infection and should not be used as the sole basis for diagnosis, treatment or other patient management decisions. Invalid results may be due to inhibiting substances in the specimen and recollection should occur.  Imaging, reviewed and are significant for:  XR Chest AP Portable     Result Date: 10/11/2023  1. Interval increase in left perihilar opacity, left-sided pleural effusion and associated left basilar atelectasis and/or pneumonia. 2. There has been no significant change in the appearance of the right lung. 3. Reported bilateral pulmonary nodules on the CT scan are less well delineated on this study. Please refer to the CT scan for further delineation. Lindie Cunning, MD 10/11/2023 7:15 AM     CT Angio Chest (PE or Trauma Protocol)     Result Date: 10/09/2023   1. No CT evidence of pulmonary embolism. 2. Pleural-based mass and bilateral pulmonary nodules are increased in size 3. Interim insertion of left-sided chest tube with decreased loculated left-sided pleural effusion J. Charlie Hoe, MD 10/09/2023 2:46 PM            Signed by: Damian LELON Rim, MD                  [facility] chair to be delivered to patient home address above. Bedside commode delivered at hospital room. FWW will be purchased by patient out of pocket.

## 2023-10-17 ENCOUNTER — Encounter: Payer: Self-pay | Admitting: Medical Oncology

## 2023-10-17 ENCOUNTER — Other Ambulatory Visit: Payer: Self-pay

## 2023-10-17 LAB — BASIC METABOLIC PANEL
Anion Gap: 8 (ref 5.0–15.0)
Anion Gap: 9 (ref 5.0–15.0)
BUN: 31 mg/dL — ABNORMAL HIGH (ref 9–28)
BUN: 32 mg/dL — ABNORMAL HIGH (ref 9–28)
CO2: 37 meq/L — ABNORMAL HIGH (ref 17–29)
CO2: 38 meq/L — ABNORMAL HIGH (ref 17–29)
Calcium: 10.5 mg/dL — ABNORMAL HIGH (ref 7.9–10.2)
Calcium: 11.1 mg/dL — ABNORMAL HIGH (ref 7.9–10.2)
Chloride: 89 meq/L — ABNORMAL LOW (ref 99–111)
Chloride: 87 meq/L — ABNORMAL LOW (ref 99–111)
Creatinine: 0.9 mg/dL (ref 0.5–1.5)
Creatinine: 0.9 mg/dL (ref 0.5–1.5)
GFR: >60 mL/min/{1.73_m2} (ref >=60.0)
GFR: >60 mL/min/{1.73_m2} (ref >=60.0)
Glucose: 201 mg/dL — ABNORMAL HIGH (ref 70–100)
Glucose: 259 mg/dL — ABNORMAL HIGH (ref 70–100)
Potassium: 3.4 meq/L — ABNORMAL LOW (ref 3.5–5.3)
Potassium: 3.5 meq/L (ref 3.5–5.3)
Sodium: 134 meq/L — ABNORMAL LOW (ref 135–145)
Sodium: 134 meq/L — ABNORMAL LOW (ref 135–145)

## 2023-10-17 LAB — SURGICAL PATHOLOGY

## 2023-10-17 LAB — PT/INR
INR: 1.8 (ref 0.9–1.1)
PT: 20.3 s — ABNORMAL HIGH (ref 10.1–12.9)

## 2023-10-17 LAB — WHOLE BLOOD GLUCOSE POCT
Whole Blood Glucose POCT: 223 mg/dL — ABNORMAL HIGH (ref 70–100)
Whole Blood Glucose POCT: 283 mg/dL — ABNORMAL HIGH (ref 70–100)
Whole Blood Glucose POCT: 219 mg/dL — ABNORMAL HIGH (ref 70–100)

## 2023-10-17 MED ORDER — WARFARIN SODIUM 3 MG PO TABS
1.5000 mg | ORAL_TABLET | Freq: Every day | ORAL | Status: DC
Start: 2023-10-17 — End: 2023-10-17
  Administered 2023-10-17: 1.5 mg via ORAL
  Filled 2023-10-17: qty 1

## 2023-10-17 MED ORDER — EMPAGLIFLOZIN 10 MG PO TABS
10.0000 mg | ORAL_TABLET | Freq: Every day | ORAL | 0 refills | Status: AC
Start: 2023-10-18 — End: ?
  Filled 2023-10-17: qty 30, 30d supply, fill #0

## 2023-10-17 MED ORDER — DILTIAZEM HCL ER COATED BEADS 240 MG PO CP24
240.0000 mg | ORAL_CAPSULE | Freq: Every day | ORAL | 0 refills | Status: DC
Start: 2023-10-18 — End: 2023-10-24
  Filled 2023-10-17: qty 30, 30d supply, fill #0

## 2023-10-17 MED ORDER — BUMETANIDE 2 MG PO TABS
2.0000 mg | ORAL_TABLET | Freq: Two times a day (BID) | ORAL | 0 refills | Status: DC
Start: 2023-10-17 — End: 2023-10-24
  Filled 2023-10-17: qty 60, 30d supply, fill #0

## 2023-10-17 MED ORDER — MORPHINE SULFATE (CONCENTRATE) 100 MG/5ML PO SOLN
2.0000 mg | Freq: Four times a day (QID) | ORAL | 0 refills | Status: DC | PRN
Start: 2023-10-17 — End: 2023-11-06
  Filled 2023-10-17: qty 30, 20d supply, fill #0

## 2023-10-17 MED ORDER — WARFARIN SODIUM 5 MG PO TABS
2.5000 mg | ORAL_TABLET | Freq: Every evening | ORAL | Status: AC
Start: 2023-10-17 — End: ?

## 2023-10-17 NOTE — Progress Notes (Signed)
Order for O2 sent to [facility] at [phone]

## 2023-10-17 NOTE — Plan of Care (Signed)
 Problem: Moderate/High Fall Risk Score >5  Goal: Patient will remain free of falls  10/17/2023 1909 by Le Cross, RN  Outcome: Completed  10/17/2023 1540 by Le Cross, RN  Outcome: Progressing  Flowsheets (Taken 10/17/2023 0800)  Moderate Risk (6-13):   MOD-Remain with patient during toileting   MOD-Consider activation of bed alarm if appropriate   MOD-Perform dangle, stand, walk (DSW) prior to mobilization   MOD-include family in multidisciplinary POC discussions     Problem: Compromised Moisture  Goal: Moisture level Interventions  Outcome: Completed     Problem: Compromised Activity/Mobility  Goal: Activity/Mobility Interventions  Outcome: Completed     Problem: Day of Admission - Heart Failure  Goal: Heart Failure Admission  Outcome: Completed     Problem: Everyday - Heart Failure  Goal: Stable Vital Signs and Fluid Balance  Outcome: Completed  Goal: Mobility/Activity is Maintained at Optimal Level for Patient  Outcome: Completed  Goal: Nutritional Intake is Adequate  Outcome: Completed  Goal: Teaching-Using CHF Warning Zones and Educational Videos  Outcome: Completed     Problem: Day of Discharge - Heart Failure  Goal: Discharge Education  10/17/2023 1909 by Le Cross, RN  Outcome: Completed  10/17/2023 1540 by Le Cross, RN  Outcome: Progressing  Flowsheets (Taken 10/17/2023 1540)  Day of Discharge:   After Visit Summary (Discharge Instuctions) with medications   CHF Warning Zones and when to call for help   Follow-up appointments   Low Sodium Diet   Fluid Restriction   Daily Standing Weights     Problem: Inadequate Gas Exchange  Goal: Adequate oxygenation and improved ventilation  10/17/2023 1909 by Le Cross, RN  Outcome: Completed  10/17/2023 1540 by Le Cross, RN  Outcome: Progressing  Flowsheets (Taken 10/17/2023 1540)  Adequate oxygenation and improved ventilation:   Assess lung sounds   Monitor SpO2 and treat as needed   Position for maximum ventilatory efficiency   Teach/reinforce use  of incentive spirometer 10 times per hour while awake, cough and deep breath as needed   Increase activity as tolerated/progressive mobility   Plan activities to conserve energy: plan rest periods  Goal: Patent Airway maintained  10/17/2023 1909 by Le Cross, RN  Outcome: Completed  10/17/2023 1540 by Le Cross, RN  Outcome: Progressing  Flowsheets (Taken 10/17/2023 1540)  Patent airway maintained:   Position patient for maximum ventilatory efficiency   Provide adequate fluid intake to liquefy secretions   Reposition patient every 2 hours and as needed unless able to self-reposition     Problem: Compromised Sensory Perception  Goal: Sensory Perception Interventions  Outcome: Completed     Problem: Compromised Friction/Shear  Goal: Friction and Shear Interventions  Outcome: Completed     Problem: Safety  Goal: Patient will be free from injury during hospitalization  10/17/2023 1909 by Le Cross, RN  Outcome: Completed  10/17/2023 1540 by Le Cross, RN  Outcome: Progressing  Flowsheets (Taken 10/17/2023 1540)  Patient will be free from injury during hospitalization:   Assess patient's risk for falls and implement fall prevention plan of care per policy   Provide and maintain safe environment   Use appropriate transfer methods   Hourly rounding   Include patient/ family/ care giver in decisions related to safety  Goal: Patient will be free from infection during hospitalization  10/17/2023 1909 by Le Cross, RN  Outcome: Completed  10/17/2023 1540 by Le Cross, RN  Outcome: Progressing  Flowsheets (Taken 10/17/2023 1540)  Free from Infection during hospitalization:   Assess  and monitor for signs and symptoms of infection   Monitor lab/diagnostic results   Monitor all insertion sites (i.e. indwelling lines, tubes, urinary catheters, and drains)   Encourage patient and family to use good hand hygiene technique     Problem: Psychosocial and Spiritual Needs  Goal: Demonstrates ability to cope with  hospitalization/illness  10/17/2023 1909 by Le Cross, RN  Outcome: Completed  10/17/2023 1540 by Le Cross, RN  Outcome: Progressing  Flowsheets (Taken 10/17/2023 1540)  Demonstrates ability to cope with hospitalizations/illness:   Encourage verbalization of feelings/concerns/expectations   Provide quiet environment   Assist patient to identify own strengths and abilities   Include patient/ patient care companion in decisions     Problem: Altered GI Function  Goal: Fluid and electrolyte balance are achieved/maintained  Outcome: Completed     Problem: Fluid and Electrolyte Imbalance/ Endocrine  Goal: Fluid and electrolyte balance are achieved/maintained  Outcome: Completed  Goal: Adequate hydration  Outcome: Completed     Problem: Nutrition  Goal: Nutritional intake is adequate  Outcome: Completed     Problem: Diabetes: Glucose Imbalance  Goal: Blood glucose stable at established goal  Outcome: Completed     Problem: Hemodynamic Status: Cardiac  Goal: Stable vital signs and fluid balance  Outcome: Completed     Problem: Inadequate Tissue Perfusion  Goal: Adequate tissue perfusion will be maintained  Outcome: Completed     Problem: Ineffective Gas Exchange  Goal: Effective breathing pattern  Outcome: Completed     Problem: Impaired Mobility  Goal: Mobility/Activity is maintained at optimal level for patient  Outcome: Completed     Problem: Peripheral Neurovascular Impairment  Goal: Extremity color, movement, sensation are maintained or improved  Outcome: Completed     Problem: Compromised skin integrity  Goal: Skin integrity is maintained or improved  Outcome: Completed     Problem: Nutrition  Goal: Nutritional intake is adequate  Outcome: Completed     Problem: Droplet Isolation  Goal: Prevent transmission of other diagnosed infection while caring for patient in Droplet isolation  Outcome: Completed

## 2023-10-17 NOTE — Telephone Encounter (Signed)
 Patient remains to be admitted at this time with plans of discharge later today.    Per insurance, infusion can't be scheduled within 24 hours of hospital discharge.     Reviewed plan with provider. Will move patient appointments to Friday.     Explained above to patient's son. Verbalized understanding.

## 2023-10-17 NOTE — Plan of Care (Signed)
 Problem: Moderate/High Fall Risk Score >5  Goal: Patient will remain free of falls  Outcome: Progressing  Flowsheets (Taken 10/17/2023 0800)  Moderate Risk (6-13):   MOD-Remain with patient during toileting   MOD-Consider activation of bed alarm if appropriate   MOD-Perform dangle, stand, walk (DSW) prior to mobilization   MOD-include family in multidisciplinary POC discussions     Problem: Day of Discharge - Heart Failure  Goal: Discharge Education  Outcome: Progressing  Flowsheets (Taken 10/17/2023 1540)  Day of Discharge:   After Visit Summary (Discharge Instuctions) with medications   CHF Warning Zones and when to call for help   Follow-up appointments   Low Sodium Diet   Fluid Restriction   Daily Standing Weights     Problem: Inadequate Gas Exchange  Goal: Adequate oxygenation and improved ventilation  Outcome: Progressing  Flowsheets (Taken 10/17/2023 1540)  Adequate oxygenation and improved ventilation:   Assess lung sounds   Monitor SpO2 and treat as needed   Position for maximum ventilatory efficiency   Teach/reinforce use of incentive spirometer 10 times per hour while awake, cough and deep breath as needed   Increase activity as tolerated/progressive mobility   Plan activities to conserve energy: plan rest periods  Goal: Patent Airway maintained  Outcome: Progressing  Flowsheets (Taken 10/17/2023 1540)  Patent airway maintained:   Position patient for maximum ventilatory efficiency   Provide adequate fluid intake to liquefy secretions   Reposition patient every 2 hours and as needed unless able to self-reposition     Problem: Safety  Goal: Patient will be free from injury during hospitalization  Outcome: Progressing  Flowsheets (Taken 10/17/2023 1540)  Patient will be free from injury during hospitalization:   Assess patient's risk for falls and implement fall prevention plan of care per policy   Provide and maintain safe environment   Use appropriate transfer methods   Hourly rounding   Include patient/  family/ care giver in decisions related to safety  Goal: Patient will be free from infection during hospitalization  Outcome: Progressing  Flowsheets (Taken 10/17/2023 1540)  Free from Infection during hospitalization:   Assess and monitor for signs and symptoms of infection   Monitor lab/diagnostic results   Monitor all insertion sites (i.e. indwelling lines, tubes, urinary catheters, and drains)   Encourage patient and family to use good hand hygiene technique     Problem: Psychosocial and Spiritual Needs  Goal: Demonstrates ability to cope with hospitalization/illness  Outcome: Progressing  Flowsheets (Taken 10/17/2023 1540)  Demonstrates ability to cope with hospitalizations/illness:   Encourage verbalization of feelings/concerns/expectations   Provide quiet environment   Assist patient to identify own strengths and abilities   Include patient/ patient care companion in decisions

## 2023-10-17 NOTE — Progress Notes (Signed)
 PULSE OXIMETRY TESTING:   Document patient's oxygen at rest on room air:   _____% on room air, at rest (No ranges please)   _____% on oxygen, at rest at_____LPM via NC  IF 88% OR BELOW on room air, STOP HERE,   IF NOT ambulate patient on room air with exertion and document below:   _____% on room air, with exertion (must be 88% or below)   _____% on oxygen, with exertion, at_____LPM via NC    All three tests must be during the same session.     Please copy and paste this template,  document saturations in appropriate places, (NO RANGES PLEASE)  in a PROGRESS NOTE.     Testing to qualify for home oxygen must be no earlier than 48 hours prior to discharge, or it will need to be repeated.

## 2023-10-17 NOTE — Progress Notes (Signed)
 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      Patient Information              Home Address: [address]  [place] [address] Employer:  Employer Address:       ,     Main Phone:   Employer Phone:     SSN: [id number]       DOB: 24-Jun-1941 (83 yrs)       Sex: Male Primary Care Physician: Lon Ip, MD   Marital Status: Widowed Referring Physician:       No ref. provider found   Race: White or Caucasian       Ethnicity: Not of Hispanic/Latino/S*       Emergency Contacts  Name Home Phone Work Phone Mobile Phone Relationship Tamsen Pickler   [facility]     [phone] Son No         Guarantor Information     Guarantor Name: ORIE, BAXENDALE Guarantor ID: [id number]   Guarantor Relationship to Pt: Self Guarantor Type: Personal/Family   Guarantor DOB:    [birth year] Billing Indicator: [name]      Guarantor Address: [address]   [place], [place] [address]          Guarantor Home Phone:   Guarantor Employer:        Guarantor Work Phone:   Pensions Consultant Emp Phone:                     Chief Strategy Officer Name: [name] Subscriber Name: [name]   Insurance Address:    [address]  [place], [place]  [address] Subscriber DOB:  10/27/1940      Subscriber ID: [id number]   Insurance Phone:   Pt Relationship to Sub:   Self   Insurance ID:   Coverage Eff From: 09/05/2006   Group Name:   Preauthorization #: NPR   Group #:   Preauthorization Days:        Secondary Insurance     Insurance Name: [name], [name] Subscriber Name: [facility]   Insurance Address:    [address] [address]  [place]  [address] Subscriber DOB: 12/12/1940      Subscriber ID: [id number]   Insurance Phone:   Pt Relationship to Sub:   Self   Insurance ID:   Coverage Eff From: 09/05/2022   Group Name:   Preauthorization #:     Group #:  [id number] Preauthorization Days:     Home Health face-to-face (FTF) Encounter (Order [id number])  Consult  Date: 10/17/2023 Department: [facility] Orig Bld[address] [place] Ordering/Authorizing: Maree Silas SAUNDERS, MD     Order Information    Order Date/Time Release Date/Time Start Date/Time End Date/Time   10/17/23 12:06 PM None 10/17/23 12:04 PM 10/17/23 12:04 PM     Order Details    Frequency Duration Priority Order Class   Once 1  occurrence Routine Hospital Performed     Standing Order Information    Remaining Occurrences Interval Last Released     0/1 Once 10/17/2023              Provider Information    Ordering User Ordering Provider Authorizing Provider   Long, Moore, RN Maree Silas SAUNDERS, MD Maree Silas SAUNDERS, MD   Attending Provider(s) Admitting Provider PCP   Sherrilee Delanna LABOR, MD; Jackson Donley FALCON, MD; Venora Damian ORN, MD; Maree Silas SAUNDERS, MD Jackson Donley FALCON, MD Lon Ip, MD     Verbal Order Info    Action Created on Order Mode Entered by Responsible Provider Signed by Signed on   Ordering 10/17/23 1206 Telephone with doyal Darra Sayres, RN Maree Silas SAUNDERS, MD Maree Silas SAUNDERS, MD 10/17/23 1226           Comments    2L/min Oxygen by (nasal cannula/face mask) (continuously/with ambulation or activity 8-10 hours per day) with portability of gas for >99 months NPI: [id number]      PULSE OXIMETRY  TESTING:  Document patient's oxygen at rest on room air:  __88___% on room air, at rest (No ranges please)  ___91__% on oxygen, at rest at__2___LPM via NC  IF 88% OR BELOW on room air, STOP HERE,  IF NOT ambulate patient on room air with exertion and document below:  _____% on room air, with exertion (must be 88% or below)  _____% on oxygen, with exertion, at_____LPM via NC     Ht: 1.651 m (5' 5)  Wt: 77 kg (169 lb 11.2 oz)    Primary Diagnosis:  Influenza A [J10.1]  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 / [place] [address]  Phone: [phone]    Fax: [phone]                Home Health face-to-face (FTF) Encounter: Patient Communication     Not Released  Not seen         Order Questions    Question Answer   Date I saw the patient face-to-face: 10/17/2023   Evidence this patient is homebound because: O.  N/A DME only   Medical conditions that necessitate Home Health care: M.  N/A DME only. No skilled services needed   Per clinical findings, following services are medically necessary: DME   DME Oxygen                    Process Instructions    Please select Home Care Services medically necessary.    Based on the above findings, I certify that this patient is confined to the home and needs intermittent skilled nursing care, physical therapry and / or speech therapy or continues to need occupational therapy. The patient is under my care, and I have initiated the establishment of the plan of care. This patient will be followed by a physician who will periodically review the  plan of care.     Collection Information            Consult Order Info    ID Description Priority Start Date Start Time   [id number] Home Health face-to-face (FTF) Encounter Routine 10/17/2023 12:04 PM   Provider Specialty Referred to   ______________________________________ _____________________________________         Acknowledgement  Info    For At Acknowledged By Acknowledged On   Placing Order 10/17/23 1206 Le Cross, RN 10/17/23 1206                     Verbal Order Info    Action Created on Order Mode Entered by Responsible Provider Signed by Signed on   Ordering 10/17/23 1206 Telephone with doyal Darra Sayres, RN Maree Silas SAUNDERS, MD Maree Silas SAUNDERS, MD 10/17/23 1226           Patient Information    Patient Name  Veleta [name] Beagle Legal Sex  Male DOB  02-21-1941       Reprint Order Requisition    Home Health face-to-face (FTF) Encounter (Order [id number]) on 10/17/23       Additional Information    Associated Reports External References   Priority and Order Details [facility]   Le Cross, RN   Registered Nurse     Nursing Progress Note     Signed     Date of Service: 10/17/2023 11:55 AM  Creation Time: 10/17/2023 11:55 AM     Signed         PULSE OXIMETRY TESTING:   Document patient's oxygen at rest on room air:   __88___% on room air, at rest (No ranges please)   ___91__% on oxygen, at rest at__2___LPM via NC  IF 88% OR BELOW on room air, STOP HERE,   IF NOT ambulate patient on room air with exertion and document below:   _____% on room air, with exertion (must be 88% or below)   _____% on oxygen, with exertion, at_____LPM via NC     All three tests must be during the same session.                               Maree Silas SAUNDERS, MD   Physician  Internal Medicine     Plan of Care     Signed     Date of Service: 10/17/2023 12:29 PM  Creation Time: 10/17/2023 12:29 PM     Signed       Summary: Home O2 MD note    O2 testing for Mayra Brahm was ordered by me and I have reviewed the testing results showing the need for home O2.

## 2023-10-17 NOTE — Nursing Progress Note (Signed)
PULSE OXIMETRY TESTING:   Document patient's oxygen at rest on room air:   88% on room air, at rest (No ranges please)   91% on oxygen, at rest at 2LPM via NC  IF 88% OR BELOW on room air, STOP HERE,   IF NOT ambulate patient on room air with exertion and document below:   _____% on room air, with exertion (must be 88% or below)   _____% on oxygen, with exertion, at_____LPM via NC     All three tests must be during the same session.      Please copy and paste this template,  document saturations in appropriate places, (NO RANGES PLEASE)  in a PROGRESS NOTE.      Testing to qualify for home oxygen must be no earlier than 48 hours prior to discharge, or it will need to be repeated.

## 2023-10-17 NOTE — Progress Notes (Signed)
 10/17/23 1249   Discharge Disposition   Patient preference/choice provided? Yes   Physical Discharge Disposition Home, Home Health  ([facility])   Name of Home Health Agency Placement [facility]   Name of DME Agency [facility]   Mode of Transportation Car   Patient/Family/POA notified of transfer plan Yes   Patient agreeable to discharge plan/expected d/c date? Yes   Family/POA agreeable to discharge plan/expected d/c date? Yes   Bedside nurse notified of transport plan? Yes   Special requirements for patient during transport: Oxygen   Outpatient Services   Home Health Home PT/OT/ST   CM Interventions   Follow up appointment scheduled? No   Reason no follow up scheduled? Family to schedule   Notified MD? Yes   Referral made for home health RN visit? No, Other (comment)   Multidisciplinary rounds/family meeting before d/c? Yes   Medicare Checklist   Is this a Medicare patient? Yes   Patient received 1st IMM Letter? n/a   If LOS 3 days or greater, did patient received 2nd IMM Letter? Yes     Chart reviewed. CM met w/ Pt and son at bedside to discuss [place]. ROC w/ [facility]. O2 and additional DME provided by [facility]. Son will provide transport home.    No other CM needs identified.    Candelaria Slocumb, MSW, Supervisee in Social Work  Product Manager I

## 2023-10-17 NOTE — Progress Notes (Signed)
 WARFARIN MANAGEMENT    Carl [name] is a 83 y.o. male presenting with PMH significant for  metastatic oropharyngeal carcinoma,COPD, atrial fibrillation (coumadin ), HFpEF, OSA, HTN and diabetes. He presents to the hospital 2/2 to rapid atrial fibrillation and decompensated heart failure.     Indication: stroke prevention secondary to atrial fibrillation   INR goal: 2 - 3    This is a continuation of home warfarin.  Warfarin counseling is deferred at this time as it is a home medication. Pharmacy will counsel if requested.  Warfarin dose PTA: 2.5mg  daily   Outpatient warfarin management team: [facility]      Recent Labs     10/17/23  0302 10/16/23  0443 10/15/23  0341   INR 1.8 1.9 2.4     Recent Labs     10/17/23  0302 10/16/23  1058   Creatinine 0.9 0.9   BUN 31* 31*     Estimated Creatinine Clearance: 60.6 mL/min (based on SCr of 0.9 mg/dL).    ASSESSMENT   INR is subtherapeutic at 1.8. The INR has decreased 0.1 from yesterday's morning draw.  The patient is not indicated for bridging.  Potential significant drug-drug interactions include: Allopurinol  (INC INR) & Diltiazem  (INC warfarin exposure & INC INR)   Diet: Adult regular     PLAN   Given subtherapeutic INR x 2 days, will recommend to increase to warfarin 1.5 mg.  The next INR level is ordered to be collected with AM labs.    Adult Inpatient Warfarin Dosing Guideline      Georgette Peppers, PharmD

## 2023-10-17 NOTE — Plan of Care (Signed)
O2 testing for Carl [name] was ordered by me and I have reviewed the testing results showing the need for home O2.

## 2023-10-17 NOTE — Discharge Summary -  Nursing (Signed)
 Pt A&Ox4, on 2L NC, son at the bedside , discharge instructions given. Medication delivered to the bedside. Portable oxygen tank delivered to the bedside and pt connected to it via NC. PIV removed. All the belongings taken. Pt taken by wheelchair transport to the green garage door 1. Home oxygen delivered to assigned address and confirmed by son.

## 2023-10-17 NOTE — Discharge Summary (Signed)
 PROGRESS NOTE/DISCHARGE SUMMARY      Date Time: 10/17/23 3:11 PM  Patient Name: Carl [name]  Attending Physician: Maree Silas SAUNDERS, MD  Primary Care Physician: Lon Ip, MD    Date of Admission:   10/09/2023    Date of Discharge:   10/17/23    Discharge Diagnoses:     Influenza A infection  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    Patient Active Problem List    Diagnosis Date Noted    Pulmonary emphysema, unspecified emphysema type 09/27/2023    PAF (paroxysmal atrial fibrillation) 09/27/2023    Hypoxia 09/10/2023    Squamous cell carcinoma of oropharynx 09/10/2023    Chronic diastolic (congestive) heart failure 01/18/2023    Melanoma in situ of face 07/20/2022    Arthritis of both knees 07/20/2022    Bilateral hip joint arthritis 07/20/2022    Intrinsic eczema 07/20/2022    Chronic cough 07/20/2022    Neuropathy due to type 2 diabetes mellitus 08/06/2020    OSA (obstructive sleep apnea) 12/21/2017    Subjective tinnitus of both ears 10/24/2017    Obesity (BMI 30-39.9) 10/10/2016    Atrial flutter 10/30/2013    Type 2 diabetes mellitus with other specified complication 08/22/2013    Hyperlipidemia associated with type 2 diabetes mellitus 04/05/2012    Arteriosclerosis of coronary artery 04/08/2011    Hypertension associated with type 2 diabetes mellitus 12/20/2010     Additional Diagnoses:   Malnutrition Documentation    Severe Malnutrition--related to inadequate protein energy intake in the setting of acute illness as evidenced by intake < 50% of estimated energy requirements for > 5 days, 10.8% weight loss x 1 month, moderate muscle depletion (temporalis, interosseous), moderate depletion of subcutaneous fat loss (orbital fat pads, buccal fat pads).      Consultations:   Treatment Team:   Maree Silas SAUNDERS, MD  Lorene Kelly SAUNDERS [place] Selinda DELENA, MD PhD  Veverly Perkins, PT  Luke Fine, RT  Le [place], RN  Jarold, Candelaria Galley, Latrelle NOVAK, [place]     Procedures/Radiology performed:      [facility] Chest AP Portable    Result Date: 10/11/2023  1. Interval increase in left perihilar opacity, left-sided pleural effusion and associated left basilar atelectasis and/or pneumonia. 2. There has been no significant change in the appearance of the right lung. 3. Reported bilateral pulmonary nodules on the CT scan are less well delineated on this study. Please refer to the CT scan for further delineation. Lindie Cunning, MD 10/11/2023 7:15 AM     Results       Procedure Component Value Units Date/Time    Whole Blood Glucose POCT [[id number]]  (Abnormal) Collected: 10/17/23 1216    Specimen: Blood, Capillary Updated: 10/17/23 1219     Whole Blood Glucose POCT 283 mg/dL     Basic Metabolic Panel [[id number]]  (Abnormal) Collected: 10/17/23 1027    Specimen: Blood, Venous Updated: 10/17/23 1059     Glucose 259 mg/dL      BUN 32 mg/dL      Creatinine 0.9 mg/dL      Calcium  11.1 mg/dL      Sodium 865 mEq/L      Potassium 3.5 mEq/L      Chloride 87 mEq/L      CO2 38 mEq/L      Anion Gap 9.0     GFR >60.0 mL/min/1.73 m2     Whole Blood Glucose POCT [[id number]]  (Abnormal) Collected: 10/17/23 0754  Specimen: Blood, Capillary Updated: 10/17/23 0757     Whole Blood Glucose POCT 223 mg/dL     Basic Metabolic Panel [[id number]]  (Abnormal) Collected: 10/17/23 0302    Specimen: Blood, Venous Updated: 10/17/23 0545     Glucose 201 mg/dL      BUN 31 mg/dL      Creatinine 0.9 mg/dL      Calcium  10.5 mg/dL      Sodium 865 mEq/L      Potassium 3.4 mEq/L      Chloride 89 mEq/L      CO2 37 mEq/L      Anion Gap 8.0     GFR >60.0 mL/min/1.73 m2     PT/INR [[id number]]  (Abnormal) Collected: 10/17/23 0302    Specimen: Blood, Venous Updated: 10/17/23 0449     PT 20.3 sec      INR 1.8    Whole Blood Glucose POCT [[id number]]  (Abnormal) Collected: 10/16/23 2113    Specimen: Blood, Capillary Updated: 10/16/23 2115     Whole Blood Glucose POCT 258 mg/dL     Whole Blood Glucose POCT [[id number]]  (Abnormal) Collected: 10/16/23 1638     Specimen: Blood, Capillary Updated: 10/16/23 1645     Whole Blood Glucose POCT 163 mg/dL     Whole Blood Glucose POCT [[id number]]  (Abnormal) Collected: 10/16/23 1154    Specimen: Blood, Capillary Updated: 10/16/23 1255     Whole Blood Glucose POCT 325 mg/dL     Basic Metabolic Panel [[id number]]  (Abnormal) Collected: 10/16/23 1058    Specimen: Blood, Venous Updated: 10/16/23 1200     Glucose 297 mg/dL      BUN 31 mg/dL      Creatinine 0.9 mg/dL      Calcium  11.2 mg/dL      Sodium 866 mEq/L      Potassium 3.7 mEq/L      Chloride 86 mEq/L      CO2 34 mEq/L      Anion Gap 13.0     GFR >60.0 mL/min/1.73 m2     Whole Blood Glucose POCT [[id number]]  (Abnormal) Collected: 10/16/23 0735    Specimen: Blood, Capillary Updated: 10/16/23 0818     Whole Blood Glucose POCT 228 mg/dL     PT/INR [[id number]]  (Abnormal) Collected: 10/16/23 0443    Specimen: Blood, Venous Updated: 10/16/23 0559     PT 21.6 sec      INR 1.9    Whole Blood Glucose POCT [[id number]]  (Abnormal) Collected: 10/15/23 2111    Specimen: Blood, Capillary Updated: 10/15/23 2120     Whole Blood Glucose POCT 209 mg/dL     Whole Blood Glucose POCT [[id number]]  (Abnormal) Collected: 10/15/23 1658    Specimen: Blood, Capillary Updated: 10/15/23 1700     Whole Blood Glucose POCT 232 mg/dL     Basic Metabolic Panel [[id number]]  (Abnormal) Collected: 10/15/23 1344    Specimen: Blood, Venous Updated: 10/15/23 1438     Glucose 284 mg/dL      BUN 26 mg/dL      Creatinine 0.9 mg/dL      Calcium  10.6 mg/dL      Sodium 866 mEq/L      Potassium 3.5 mEq/L      Chloride 88 mEq/L      CO2 34 mEq/L      Anion Gap 11.0     GFR >60.0 mL/min/1.73 m2     Basic Metabolic Panel [[id number]]  (Abnormal) Collected: 10/15/23 1114  Specimen: Blood, Venous Updated: 10/15/23 1346     Glucose 306 mg/dL      BUN 27 mg/dL      Creatinine 0.9 mg/dL      Calcium  10.7 mg/dL      Sodium 867 mEq/L      Potassium 3.6 mEq/L      Chloride 86 mEq/L      CO2 36 mEq/L      Anion Gap 10.0     GFR  >60.0 mL/min/1.73 m2     Whole Blood Glucose POCT [[id number]]  (Abnormal) Collected: 10/15/23 1206    Specimen: Blood, Capillary Updated: 10/15/23 1211     Whole Blood Glucose POCT 338 mg/dL     Whole Blood Glucose POCT [[id number]]  (Abnormal) Collected: 10/15/23 0746    Specimen: Blood, Capillary Updated: 10/15/23 0747     Whole Blood Glucose POCT 196 mg/dL     PT/INR [[id number]]  (Abnormal) Collected: 10/15/23 0341    Specimen: Blood, Venous Updated: 10/15/23 0516     PT 27.0 sec      INR 2.4    Whole Blood Glucose POCT [[id number]]  (Abnormal) Collected: 10/14/23 2117    Specimen: Blood, Capillary Updated: 10/14/23 2120     Whole Blood Glucose POCT 209 mg/dL     Whole Blood Glucose POCT [[id number]]  (Abnormal) Collected: 10/14/23 1707    Specimen: Blood, Capillary Updated: 10/14/23 1710     Whole Blood Glucose POCT 185 mg/dL     Whole Blood Glucose POCT [[id number]]  (Abnormal) Collected: 10/14/23 1155    Specimen: Blood, Capillary Updated: 10/14/23 1157     Whole Blood Glucose POCT 277 mg/dL     Whole Blood Glucose POCT [[id number]]  (Abnormal) Collected: 10/14/23 0817    Specimen: Blood, Capillary Updated: 10/14/23 0834     Whole Blood Glucose POCT 240 mg/dL     PT/INR [[id number]]  (Abnormal) Collected: 10/14/23 0318    Specimen: Blood, Venous Updated: 10/14/23 0509     PT 31.0 sec      INR 2.8    Whole Blood Glucose POCT [[id number]]  (Abnormal) Collected: 10/13/23 2256    Specimen: Blood, Capillary Updated: 10/13/23 2259     Whole Blood Glucose POCT 298 mg/dL     Whole Blood Glucose POCT [[id number]]  (Abnormal) Collected: 10/13/23 2115    Specimen: Blood, Capillary Updated: 10/13/23 2118     Whole Blood Glucose POCT 423 mg/dL     Comprehensive Metabolic Panel [[id number]]  (Abnormal) Collected: 10/13/23 1755    Specimen: Blood, Venous Updated: 10/13/23 2036     Glucose 138 mg/dL      BUN 24 mg/dL      Creatinine 0.9 mg/dL      Sodium 862 mEq/L      Potassium 3.4 mEq/L      Chloride 88 mEq/L      CO2 35  mEq/L      Calcium  10.8 mg/dL      Anion Gap 85.9     GFR >60.0 mL/min/1.73 m2      AST (SGOT) 36 U/L      ALT 25 U/L      Alkaline Phosphatase 156 U/L      Albumin  1.9 g/dL      Protein, Total 5.8 g/dL      Globulin 3.9 g/dL      Albumin /Globulin Ratio 0.5     Bilirubin, Total 0.6 mg/dL     Calcium , Ionized [[id number]]  (Abnormal) Collected: 10/13/23 1755  Specimen: Blood, Venous Updated: 10/13/23 2015     Calcium , Ionized 2.74 mEq/L     Whole Blood Glucose POCT [[id number]]  (Abnormal) Collected: 10/13/23 1700    Specimen: Blood, Capillary Updated: 10/13/23 1702     Whole Blood Glucose POCT 165 mg/dL               Hospital Course:     83yo male with history of CHF, atrial fibrillation on Eliquis, 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  Patient also was found to have the flu. Now overall clinically improved and stable for d/c home.     #Acute on chronic diastolic heart failure:   - Will stop his chlorthalidone   - ready for d/c on bumex  BID  - daily weights, I/O     #SOB  #Hypoxia  - Home O2 ordered and will be delivered tonight  --Improved overall since diuresis  -- Continue to wean oxygen as tolerated  -- May need pleurex removed if there is no output in the coming weeks.   --Liquid Morphine  2mg , PRN for Shortness of breath - this seems to help significantly over night.      #Atrial fibrillation with RVR  - resume home metoprolol  100mg  daily  - D/c'd on daily diltiazem    - Continue coumadin      #Coumadin  management:    f/u INR improved, pharmacy to dose.     #Flu A   - Continue tamiflu      #Leukocytosis:    -- Likely secondary malignancy     #Loculated left pleural effusion with PleurX placement:  malignant, drained every 3 days,   -- Minimal drainage in the last month.  Will follow up with interventional pulm and consider removal if there is minimal benefit     #HTN:  -continue metoprolol  as above  -continue thiazide as above  -continue imdur  30mg  daily     #Gout:  chronic without acute flare  -continue home allopurinol  300mg  daily     #Oropharyngeal cancer:  he is to start chemotherapy and immunotherapy on 2/12    DISCHARGE DAY EXAM:  Patient Vitals for the past 24 hrs:   BP Temp Temp src Pulse Resp SpO2 Weight   10/17/23 1138 103/65 97.7 F (36.5 C) Oral (!) 109 18 95 % --   10/17/23 1128 101/65 -- -- (!) 113 16 95 % --   10/17/23 0854 104/60 -- -- (!) 102 -- -- --   10/17/23 0732 101/66 97.7 F (36.5 C) Oral 98 16 98 % --   10/17/23 0600 -- -- -- -- -- -- 77 kg (169 lb 11.2 oz)   10/17/23 0303 111/62 98.2 F (36.8 C) Oral 98 17 92 % 77 kg (169 lb 11.2 oz)   10/17/23 0027 104/66 -- -- 93 -- -- --   10/16/23 2259 101/64 97.2 F (36.2 C) Oral 97 20 94 % --   10/16/23 1913 114/68 97.9 F (36.6 C) Oral 84 20 95 % --   10/16/23 1801 119/63 -- -- 83 -- -- --     Body mass index is 28.24 kg/m.  General: awake, alert, oriented x 3; no acute distress.  Cardiovascular: regular rate and rhythm, no murmurs, rubs or gallops  Lungs: clear to auscultation bilaterally, without wheezing, rhonchi, or rales  Abdomen: soft, non-tender,  non-distended; no palpable masses, no hepatosplenomegaly, normoactive bowel sounds, no rebound or guarding  Extremities: no clubbing, cyanosis, or edema    Discharge Medications:        Medication List        START taking these medications      albuterol -ipratropium 2.5-0.5(3) mg/3 mL nebulizer  Commonly known as: DUO-NEB  Take 3 mLs by nebulization once for 1 dose     budesonide -formoterol  160-4.5 MCG/ACT inhaler  Commonly known as: SYMBICORT   Inhale 2 puffs into the lungs 2 (two) times daily     bumetanide  2 MG tablet  Commonly known as: BUMEX   Take 1 tablet (2 mg) by mouth 2 (two) times daily     dilTIAZem  240 MG 24 hr capsule  Commonly known as: CARDIZEM   CD  Take 1 capsule (240 mg) by mouth daily  Start taking on: October 18, 2023     empagliflozin  10 MG tablet  Commonly known as: JARDIANCE   Take 1 tablet (10 mg) by mouth daily  Start taking on: October 18, 2023     morphine  (concentrate) 20 MG/ML concentrated solution  Take 0.1 mLs (2 mg) by mouth every 6 (six) hours as needed (SOB)     tamsulosin  0.4 MG Caps  Commonly known as: FLOMAX   Take 1 capsule (0.4 mg) by mouth Daily after dinner            CONTINUE taking these medications      albuterol  sulfate HFA 108 (90 Base) MCG/ACT inhaler  Commonly known as: PROVENTIL      allopurinol  300 MG tablet  Commonly known as: ZYLOPRIM   Take 1 tablet (300 mg) by mouth daily     atorvastatin  40 MG tablet  Commonly known as: LIPITOR  Take 1 tablet (40 mg) by mouth daily     clobetasol 0.05 % cream  Commonly known as: TEMOVATE     Dulera 200-5 MCG/ACT Aero  Generic drug: Mometasone  Furo-Formoterol  Fum     dupilumab  300 MG/2ML injection  Commonly known as: DUPIXENT      glipiZIDE  10 MG 24 hr tablet  Commonly known as: GLUCOTROL   Take 1 tablet (10 mg) by mouth daily     GLUCOSAMINE-CHONDROITIN PO     isosorbide  mononitrate 30 MG 24 hr tablet  Commonly known as: IMDUR   Take 1 tablet (30 mg) by mouth daily     metFORMIN  500 MG 24 hr tablet  Commonly known as: GLUCOPHAGE -XR  Take 2 tablets (1,000 mg) by mouth 2 (two) times daily     metoprolol  succinate 100 MG 24 hr tablet  Commonly known as: TOPROL -XL  Take 1 tablet (100 mg) by mouth daily     OneTouch Ultra test strip  Generic drug: glucose blood  Use As Directed     Vitamin D (Cholecalciferol) 50 MCG (2000 UT) Caps     warfarin 5 MG tablet  Commonly known as: COUMADIN   Take 0.5 tablets (2.5 mg) by mouth every evening INR 2-3 goal            STOP taking these medications      chlorthalidone  25 MG tablet     furosemide  20 MG tablet  Commonly known as: LASIX                Where to Get Your Medications        These medications were sent to [facility] [id number],  [place] - [address]  [address], [place] [address]  Phone: [phone]   albuterol -ipratropium 2.5-0.5(3) mg/3 mL nebulizer  budesonide -formoterol  160-4.5 MCG/ACT inhaler  tamsulosin  0.4 MG Caps       These medications were sent to [facility]  [address], [place] [address]      Hours: M-Su 8AM-8PM Phone: [phone]   bumetanide  2 MG tablet  dilTIAZem  240 MG 24 hr capsule  empagliflozin  10 MG tablet  morphine  (concentrate) 20 MG/ML concentrated solution       Information about where to get these medications is not yet available    Ask your nurse or doctor about these medications  warfarin 5 MG tablet       Discharge Instructions:     Disposition:  home    Multidisciplinary Visit: Provider(s) to Follow Up With       Lon Ip, MD   Specialty: Internal Medicine   Relationship: PCP - General    [facility] [place]  [address]  [place] [address]   Phone: [phone]       Next Steps: Schedule an appointment as soon as possible for a visit in 1 week(s)    Instructions: to follow up on your recovery          Minutes spent coordinating discharge and reviewing discharge plan: 45 minutes      Signed by: Silas JONELLE Fairly, MD    CC: Lon Ip, MD

## 2023-10-18 ENCOUNTER — Telehealth (INDEPENDENT_AMBULATORY_CARE_PROVIDER_SITE_OTHER): Payer: Self-pay

## 2023-10-18 ENCOUNTER — Encounter: Payer: Self-pay | Admitting: Medical Oncology

## 2023-10-18 ENCOUNTER — Ambulatory Visit (INDEPENDENT_AMBULATORY_CARE_PROVIDER_SITE_OTHER): Payer: Self-pay

## 2023-10-18 ENCOUNTER — Other Ambulatory Visit (INDEPENDENT_AMBULATORY_CARE_PROVIDER_SITE_OTHER): Payer: Medicare Other

## 2023-10-18 ENCOUNTER — Ambulatory Visit: Payer: Medicare Other | Admitting: Pulmonary Disease

## 2023-10-18 ENCOUNTER — Ambulatory Visit: Payer: Medicare Other

## 2023-10-18 ENCOUNTER — Ambulatory Visit: Payer: Medicare Other | Admitting: Medical Oncology

## 2023-10-18 ENCOUNTER — Ambulatory Visit (INDEPENDENT_AMBULATORY_CARE_PROVIDER_SITE_OTHER): Payer: Medicare Other | Admitting: Family Medicine

## 2023-10-18 DIAGNOSIS — I4892 Unspecified atrial flutter: Secondary | ICD-10-CM

## 2023-10-18 MED ORDER — WARFARIN SODIUM 1 MG PO TABS
1.0000 mg | ORAL_TABLET | Freq: Every day | ORAL | 0 refills | Status: AC
Start: 2023-10-18 — End: ?

## 2023-10-18 NOTE — Progress Notes (Signed)
 October 18, 2023    INR Goal:  2-3    Warfarin Indication: Paroxysmal AFib/AFlutter     CHA2DS2-VASc Score:  5 HTN (1), Age >/=75 (2), DM (1), and Vascular Dx (1)  HASBLED Score:  1 Elderly (Age>65)    Referring Provider: Dr. Lamar Platts     Referral Date:  09/06/22    Last visit with referring provider:  08/09/23    PMH:    Past Medical History:   Diagnosis Date    Diabetes mellitus     Hyperlipidemia     Hypertension      Current warfarin dose:    Instructed Dose Reported Dose   Warfarin 2.5 mg daily  See Anticoag Tracker for doses given while in hospital.   No deviation from instructed dose.  Denies missed or extras dose(s).   Pt checked INR later than instructed at last visit   7-day warfarin total: 3 mg 7-day warfarin total: 3 mg      Lab Results   Component Value Date    INR 1.8 10/17/2023    INR 1.9 10/16/2023    INR 2.4 10/15/2023    INR 2.8 10/14/2023    INR 4.3 10/13/2023    INR 4.8 10/12/2023     Dietary intake of vitamin K:  Typically 4-5 servings of green beans and salad (Spring mix, carrots, tomatoes, onions) per week. May have 1 serving of high vitamin K vegetables (collard greens) once monthly. Patient is in assisted living facility and eats whatever is served at the facility. No reported changes since last outreach. Son reports pt has low appetite and liquid is better for him to swallow and eat, since hospital d/c yesterday 2/11 gave pt 1/2 of premier protein shake (vitamin K 30 mcg).      Alcohol consumption: No alcohol consumption since 1989.      Tobacco use: Former, quit in 1989      Medication changes:  D/c chlorthalidone  and start bumex  2 mg BID.      New Medication/Dietary/Herbal Interactions: N/A    Concurrent significant interacting medications/supplements:    Medications that may enhance anticoagulant effect or bleeding risks:  APAP (patient takes prn <2000 mg/day)  Allopurinol   Glucosamine  Omega-3 fatty acids  Medications that may diminish anticoagulant effect:  Vitamin  C  Metformin   MVI ([employer] brand, vitamin K 25 mcg)- (did not receive in hospital per son, will resume tomorrow if pt able to swallow)      S/sx of bleeding/clotting:  Denies red or dark brown urine, red or black, tarry stool, vomiting or coughing up blood, unexplained bruising, bleeding that doesn't stop or is very heavy.     Recent Falls: Denies.   Patient uses cane to assist with mobility.      Upcoming procedures/surgeries: Nothing scheduled at this time.     Is patient a candidate for DOAC?  Yes    If yes, date and outcome of discussion: 10/06/22: Per pharmacy liaison, copay for Eliquis is $771.30/90 days (periodic deductible $545+coinsurance $226.30). Once deductible met, Eliquis would be ~$75/month. Patient has Medicare and not eligible for copay card.  Patient prefers to continue warfarin for now.     Others:  2/3-2/11 Hospital admit- cc shortness of breath was found to be in HFpEF exacerbation with underlying atrial fibrillation with RVR.  Patient also was found to have the flu.   Instructed pt to check INR in the morning M-Th.  INR received after 3pm will be addressed the following  business day.  Instructed patient to call [facility] if no call received by 3PM the day after INR testing.     Best Contact:  Best time to reach patient is 8a-3pm. [phone]    INR Result:  Lab Results   Component Value Date    INR 1.8 10/17/2023     via home POCT   =======================================    A/P:  INR 1.8 below therapeutic range. Pt with recent hospital admit for SOB, HF exacerbation and Flu. INRs have been out of range recently. TWD this week with held doses 3 mg/wk. INR consistently above range suggests pt requires less warfarin than previous. Of note, pt had been in range with TWD of 45 mg/wk a few weeks ago.   Based on supratherapeutic INRs over the last month and recent reintroduction of Vit K supplements, will reduce total weekly dose to 17.5mg .   Pt only has supply of warfarin 5 mg at home. Warfarin 1 mg  Rx sent to pharmacy on file to help with dose manipulation as needed in the future. Son verbalized understanding and will pick up later this week or next week, if instructed by [facility].   Warfarin 2.5 mg daily until next INR check.  Recheck INR tomorrow on 10/19/23 via home POCT.  Warfarin has been managed per Clinic Protocol    Patient's son, Gene, verbalized understanding of instructions provided. Pt's son states he is out of it right now and not able to speak with us . Interview conducted with son via telephone. Son requested communication to be with him at [phone] as pt is ill and son is taking care of his medical needs at this time.       Camie Cass, PharmD  Clinical Pharmacy Specialist  [facility]  Ph: [phone]

## 2023-10-18 NOTE — Telephone Encounter (Signed)
 Hospital D/C Template    Chart Review:     Type of Encounter :  Inpatient  Facility: [facility]  Discharge Date: 10/17/23  Primary Discharge Dx: 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 Resolved  #Influenza A infection  #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  #SOB  #Hypoxia  #Atrial fibrillation with RVR   #Coumadin  management:  f/u INR improved, pharmacy to dose.    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 Pt was home napping at time of call.      Patient Interview:    "I heard your father went to the hospital for SOB, tell me what happened? Hx- worsening sob, esp at night - unable to sleep for several nights due to sob- unable to lie down in bed, increased leg swelling,  went to UC and sent to ED  What type of teaching did you receive regarding your symptoms/diagnosis? Heart failure, fluid overload  What symptoms do you have now, if any? Reports pt is much more comfortable at home now on oxygen, has been resting well, slept flat in bed - without chest pain, no sob/cough- using oxygen at 3 L/min at rest, and plans to decrease to 2.5 and check sats and comfort level this afternoon. Has pleurex cath in, has had no to minimal drainage for several weeks, will f/u with IR on possible removal. Eating, drinking fine, voiding well. Still with BLE edema, wearing  compression stockings- advised to remove for skin care daily while lying down. Pt is generally weak and fatigued, but feeling better and resting quietly today at home. Discussed home oxygen use/precautions. Checks BG regularly at home.  Tell me about your current medications and any changes the hospital made: Carl [name] chlorthalidone , lasix  . Resume other routine meds per AVS- on warfarin  If new medication prescribed: bumex , diltiazem , jardiance , symbicort  inhaler, duonebs, flomax , and MSO4 liquid prn  Tell me about the new medication and how you are to take it? Reviewed medications with son, who is assisting with med admin at home- changes made to home supplies, had no other questions  What difficulty did you have, if any, picking up your medication(s)? None, got at hospital pharmacy and community  pharmacy  If the new medication is an injection: n/a    What appointments have you made for follow-up?Card, Pulm, Abigail Connors, and PCP- initial fuv is video- uses myChart, son can assist  What transportation options do you have to make it to your appointments? Car, son, oxygen for transport  What assistance, if any, are you in need of while continuing your care at home? Needs occasional assist with some ADL, mobility - used cane - now rollator walker - for ambulation. Lived alone in indep living apt, senior living community. Son has been staying with  him since last Hosp admit in Jan for PNA, for extra assist  Caregivers [facility] - SN, PT, for [facility]  New DME/ [employer]- Home Oxygen setup, nebulizer, wheelchair, bsc.  Has home concentrator and portable transport tank    "Thank you for taking my call today! If there is anything that you need, please give your office a call at [facility], [phone]."  notified son that AVS had incorrect phone number listed, confirmed [facility] office number with him during call.     *Instructed patient to call back if symptoms change or worsen and/or go to the emergency room or call 911 for  emergency symptoms*

## 2023-10-18 NOTE — Telephone Encounter (Signed)
 Pt's daughter in law called. Pt was in [name] hospital DX: Flu A and Acute heart failure from 10/09/23 to 10/17/23. Homecare nurse visit this PM BP 86/41 and 90/40 at 0315pm.   Rechecked pt's BP in sitting 109/53. Pt denies HA/CP and on NC 2.5L. Pt report fatigue and tiredness. Arousable with verbal stimuli. Per Dr Silvano advise, stop BP med for now and called Cardiology ASAP. Pt's daughter in law verbalized understanding.

## 2023-10-18 NOTE — Telephone Encounter (Signed)
 Spoke with Carl [name]'s daughter in law and his son. The patient is back home from the hospital as of 2/11. This AM he had BP of 123/74, ps 113. He received his AM meds, including: Isosorbide  30 mg, Toprol  100 mg, Diltiazem  240 mg, Bumex  2 mg. In early PM the [facility] visiting nurse checked his BP again- 86/41, recheck 90/40. The patient denied shortness of breath, dizziness, or any pain; the relatives just note him being extremely sleepy since morning, possibly due to just being out of the hospital. At about 330 PM BP recheck 128/?, ps of 88.    The patient is scheduled to see [facility] NP tomorrow AM. Dr Roz, please advise if his meds intake time needs to be modified.

## 2023-10-18 NOTE — Telephone Encounter (Signed)
 Relayed the question to Clabe Brunette, NP Her response:  Hold Imdur  tomorrow morning if SBP is less than 100. We will make further adjustments tomorrow after I see him.     Spoke with patient's son. He The verbalized understanding, and will follow the recommendations.

## 2023-10-19 ENCOUNTER — Emergency Department: Payer: Medicare Other

## 2023-10-19 ENCOUNTER — Telehealth (INDEPENDENT_AMBULATORY_CARE_PROVIDER_SITE_OTHER): Payer: Self-pay | Admitting: Internal Medicine

## 2023-10-19 ENCOUNTER — Ambulatory Visit (INDEPENDENT_AMBULATORY_CARE_PROVIDER_SITE_OTHER): Payer: Medicare Other | Admitting: Nurse Practitioner

## 2023-10-19 ENCOUNTER — Encounter (INDEPENDENT_AMBULATORY_CARE_PROVIDER_SITE_OTHER): Payer: Self-pay | Admitting: Nurse Practitioner

## 2023-10-19 ENCOUNTER — Other Ambulatory Visit: Payer: Self-pay | Admitting: Medical Oncology

## 2023-10-19 ENCOUNTER — Emergency Department
Admission: EM | Admit: 2023-10-19 | Discharge: 2023-10-20 | Disposition: A | Discharge status: Other Acute Inpatient Hospital | Payer: Medicare Other | Attending: Emergency Medical Services | Admitting: Emergency Medical Services

## 2023-10-19 VITALS — BP 94/60 | HR 91 | Ht 65.0 in | Wt 172.1 lb

## 2023-10-19 DIAGNOSIS — I4892 Unspecified atrial flutter: Secondary | ICD-10-CM

## 2023-10-19 DIAGNOSIS — Z87891 Personal history of nicotine dependence: Secondary | ICD-10-CM | POA: Insufficient documentation

## 2023-10-19 DIAGNOSIS — J90 Pleural effusion, not elsewhere classified: Secondary | ICD-10-CM | POA: Insufficient documentation

## 2023-10-19 DIAGNOSIS — R6520 Severe sepsis without septic shock: Secondary | ICD-10-CM | POA: Insufficient documentation

## 2023-10-19 DIAGNOSIS — A419 Sepsis, unspecified organism: Secondary | ICD-10-CM | POA: Insufficient documentation

## 2023-10-19 DIAGNOSIS — I5022 Chronic systolic (congestive) heart failure: Secondary | ICD-10-CM

## 2023-10-19 DIAGNOSIS — N179 Acute kidney failure, unspecified: Secondary | ICD-10-CM | POA: Insufficient documentation

## 2023-10-19 LAB — LAB USE ONLY - CBC WITH DIFFERENTIAL
Absolute Basophils: 0.02 10*3/uL (ref 0.00–0.08)
Absolute Eosinophils: 0 10*3/uL (ref 0.00–0.44)
Absolute Immature Granulocytes: 0.17 10*3/uL — ABNORMAL HIGH (ref 0.00–0.07)
Absolute Lymphocytes: 1.13 10*3/uL (ref 0.42–3.22)
Absolute Monocytes: 0.9 10*3/uL — ABNORMAL HIGH (ref 0.21–0.85)
Absolute Neutrophils: 18.18 10*3/uL — ABNORMAL HIGH (ref 1.10–6.33)
Absolute nRBC: 0 10*3/uL (ref <=0.00)
Basophils %: 0.1 %
Eosinophils %: 0 %
Hematocrit: 40.7 % (ref 37.6–49.6)
Hemoglobin: 13.3 g/dL (ref 12.5–17.1)
Immature Granulocytes %: 0.8 %
Lymphocytes %: 5.5 %
MCH: 30 pg (ref 25.1–33.5)
MCHC: 32.7 g/dL (ref 31.5–35.8)
MCV: 91.7 fL (ref 78.0–96.0)
MPV: 11.4 fL (ref 8.9–12.5)
Monocytes %: 4.4 %
Neutrophils %: 89.2 %
Platelet Count: 311 10*3/uL (ref 142–346)
Preliminary Absolute Neutrophil Count: 18.18 10*3/uL — ABNORMAL HIGH (ref 1.10–6.33)
RBC: 4.44 10*6/uL (ref 4.20–5.90)
RDW: 16 % — ABNORMAL HIGH (ref 11–15)
WBC: 20.4 10*3/uL — ABNORMAL HIGH (ref 3.10–9.50)
nRBC %: 0 /100{WBCs} (ref <=0.0)

## 2023-10-19 LAB — COMPREHENSIVE METABOLIC PANEL
ALT: 35 U/L (ref <=55)
AST (SGOT): 51 U/L — ABNORMAL HIGH (ref <=41)
Albumin/Globulin Ratio: 0.6 — ABNORMAL LOW (ref 0.9–2.2)
Albumin: 2.2 g/dL — ABNORMAL LOW (ref 3.5–5.0)
Alkaline Phosphatase: 175 U/L — ABNORMAL HIGH (ref 37–117)
Anion Gap: 19 — ABNORMAL HIGH (ref 5.0–15.0)
BUN: 73 mg/dL — ABNORMAL HIGH (ref 9–28)
Bilirubin, Total: 0.5 mg/dL (ref 0.2–1.2)
CO2: 29 meq/L (ref 17–29)
Calcium: 11.1 mg/dL — ABNORMAL HIGH (ref 7.9–10.2)
Chloride: 86 meq/L — ABNORMAL LOW (ref 99–111)
Creatinine: 1.4 mg/dL (ref 0.5–1.5)
GFR: 50.2 mL/min/{1.73_m2} — ABNORMAL LOW (ref >=60.0)
Globulin: 3.7 g/dL — ABNORMAL HIGH (ref 2.0–3.6)
Glucose: 179 mg/dL — ABNORMAL HIGH (ref 70–100)
Potassium: 4.1 meq/L (ref 3.5–5.3)
Protein, Total: 5.9 g/dL — ABNORMAL LOW (ref 6.0–8.3)
Sodium: 134 meq/L — ABNORMAL LOW (ref 135–145)

## 2023-10-19 LAB — PHOSPHORUS: Phosphorus: 3.8 mg/dL (ref 2.3–4.7)

## 2023-10-19 LAB — ECG 12-LEAD
Q-T Interval: 290 ms
QRS Duration: 86 ms
QTC Calculation (Bezet): 338 ms
R Axis: 19 degrees
T Axis: -72 degrees
Ventricular Rate: 82 {beats}/min

## 2023-10-19 LAB — PT/INR
INR: 2 (ref 0.9–1.1)
PT: 22.8 s — ABNORMAL HIGH (ref 10.1–12.9)

## 2023-10-19 LAB — LACTIC ACID
Lactic Acid: 6.5 mmol/L (ref 0.2–2.0)
Lactic Acid: 3.9 mmol/L — ABNORMAL HIGH (ref 0.2–2.0)

## 2023-10-19 LAB — MAGNESIUM: Magnesium: 1.9 mg/dL (ref 1.6–2.6)

## 2023-10-19 MED ORDER — STERILE WATER FOR INJECTION IJ/IV SOLN (WRAP)
4.5000 g | Freq: Once | INTRAVENOUS | Status: AC
Start: 2023-10-19 — End: 2023-10-19
  Administered 2023-10-19: 4.5 g via INTRAVENOUS
  Filled 2023-10-19: qty 20

## 2023-10-19 MED ORDER — VANCOMYCIN HCL IN NACL 1.5-0.9 GM/500ML-% IV SOLN
20.0000 mg/kg | Freq: Once | INTRAVENOUS | Status: AC
Start: 2023-10-19 — End: 2023-10-19
  Administered 2023-10-19: 1500 mg via INTRAVENOUS
  Filled 2023-10-19: qty 500

## 2023-10-19 MED ORDER — IOHEXOL 350 MG/ML IV SOLN
80.0000 mL | Freq: Once | INTRAVENOUS | Status: AC | PRN
Start: 2023-10-19 — End: 2023-10-19
  Administered 2023-10-19: 80 mL via INTRAVENOUS

## 2023-10-19 MED ORDER — SODIUM CHLORIDE 0.9 % IV BOLUS
30.0000 mL/kg | Freq: Once | INTRAVENOUS | Status: AC
Start: 2023-10-19 — End: 2023-10-20
  Administered 2023-10-19: 2424 mL via INTRAVENOUS

## 2023-10-19 MED ORDER — SODIUM CHLORIDE 0.9 % IV BOLUS
1000.0000 mL | Freq: Once | INTRAVENOUS | Status: DC
Start: 2023-10-19 — End: 2023-10-19
  Administered 2023-10-19: 1000 mL via INTRAVENOUS

## 2023-10-19 NOTE — ED Provider Notes (Signed)
 [facility]    EMERGENCY DEPARTMENT HISTORY AND PHYSICAL EXAM    Date: 10/19/2023  Patient Name: Carl [name]  Attending Physician: Lazarus Shelby LABOR, MD  Patient DOB:  21-Mar-1941  MRN:  [id number]  Room:  31/SS 31    Patient was evaluated by ED physician, Dr Lazarus, at 7:29 PM    History     Chief Complaint   Patient presents with    Fatigue    Loss of appetite    Generalized weakness          The patient Carl [name], is a 83 y.o. male with history of diabetes mellitus hypertension hyperlipidemia paroxysmal A-fib who presents with chief complaint of generalized weakness and anorexia since discharge from the hospital 2 days ago when he was admitted for flu.  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  Anorexia noted.  He has been urinating less.  There is no dysuria.  No urinary frequency urgency hematuria.  No flank pain.  No chest pain or shortness of breath.  No abdominal pain.  Family has noted that the Pleurx catheter has not been draining in the past 1 to 2 weeks with only 2 drops coming out of it when it was attempted to be drained last week.  Previously to that it was draining 3 times a week.  No overt shortness of breath noted.  No wheezing.  PCP:  Lon Ip, MD      Past Medical History       Medical History[1]      Past Surgical History       Past Surgical History[2]      Family History    Family History[3]    Social History    Social History     Socioeconomic History    Marital status: Widowed     Spouse name: None    Number of children: None    Years of education: None    Highest education level: None   Occupational History    None   Tobacco Use    Smoking status: Former     Types: Cigarettes     Passive exposure: Past    Smokeless tobacco: None    Tobacco comments:     Quit smoking June 1989, smoked 30 years before   Vaping Use    Vaping  status: Never Used   Substance and Sexual Activity    Alcohol use: Not Currently    Drug use: Not Currently    Sexual activity: Not Currently   Other Topics Concern    None   Social History Narrative    None     Social Drivers of Health     Financial Resource Strain: Low Risk  (10/10/2023)    Overall Financial Resource Strain (CARDIA)     Difficulty of Paying Living Expenses: Not hard at all   Food Insecurity: No Food Insecurity (10/09/2023)    Hunger Vital Sign     Worried About Running Out of Food in the Last Year: Never true     Ran Out of Food in the Last Year: Never true   Transportation Needs: No Transportation Needs (10/10/2023)    PRAPARE - Therapist, Art (Medical): No     Lack of Transportation (Non-Medical): No   Physical Activity: Inactive (10/08/2023)  Exercise Vital Sign     Days of Exercise per Week: 0 days     Minutes of Exercise per Session: 20 min   Stress: No Stress Concern Present (10/08/2023)    [employer] of Occupational Health - Occupational Stress Questionnaire     Feeling of Stress : Only a little   Social Connections: Moderately Isolated (10/08/2023)    Social Connection and Isolation Panel [NHANES]     Frequency of Communication with Friends and Family: More than three times a week     Frequency of Social Gatherings with Friends and Family: More than three times a week     Attends Religious Services: More than 4 times per year     Active Member of [employer] or Organizations: No     Attends Banker Meetings: Not on file     Marital Status: Widowed   Intimate Partner Violence: Not At Risk (10/09/2023)    Humiliation, Afraid, Rape, and Kick questionnaire     Fear of Current or Ex-Partner: No     Emotionally Abused: No     Physically Abused: No     Sexually Abused: No   Housing Stability: Low Risk  (10/10/2023)    Housing Stability Vital Sign     Unable to Pay for Housing in the Last Year: No     Number of Times Moved in the Last Year: 0     Homeless in the Last  Year: No       Allergies    Allergies[4]      Current/Home Medications    Current/Home Medications    ALBUTEROL  SULFATE HFA (PROVENTIL ) 108 (90 BASE) MCG/ACT INHALER    Inhale 2 puffs into the lungs every 4 (four) hours as needed for Shortness of Breath    ALBUTEROL -IPRATROPIUM (DUO-NEB) 2.5-0.5(3) MG/3 ML NEBULIZER    Take 3 mLs by nebulization once for 1 dose    ALLOPURINOL  (ZYLOPRIM ) 300 MG TABLET    Take 1 tablet (300 mg) by mouth daily    ATORVASTATIN  (LIPITOR) 40 MG TABLET    Take 1 tablet (40 mg) by mouth daily    BUDESONIDE -FORMOTEROL  (SYMBICORT ) 160-4.5 MCG/ACT INHALER    Inhale 2 puffs into the lungs 2 (two) times daily    BUMETANIDE  (BUMEX ) 2 MG TABLET    Take 1 tablet (2 mg) by mouth 2 (two) times daily    CLOBETASOL (TEMOVATE) 0.05 % CREAM    Apply topically daily as needed (for eczema)    DILTIAZEM  (CARDIZEM  CD) 240 MG 24 HR CAPSULE    Take 1 capsule (240 mg) by mouth daily    DUPILUMAB  (DUPIXENT ) 300 MG/2ML INJECTION    Inject 300 mg into the skin every 14 (fourteen) days On tues    EMPAGLIFLOZIN  (JARDIANCE ) 10 MG TABLET    Take 1 tablet (10 mg) by mouth daily    GLIPIZIDE  (GLUCOTROL ) 10 MG 24 HR TABLET    Take 1 tablet (10 mg) by mouth daily    GLUCOSAMINE-CHONDROITIN PO    Take 2 tablets by mouth every morning (Glucosamine HCI 1500 mg and Chondroitin Sulfate 1200 mg)    ISOSORBIDE  MONONITRATE (IMDUR ) 30 MG 24 HR TABLET    Take 1 tablet (30 mg) by mouth daily    METFORMIN  (GLUCOPHAGE -XR) 500 MG 24 HR TABLET    Take 2 tablets (1,000 mg) by mouth 2 (two) times daily    METOPROLOL  SUCCINATE (TOPROL -XL) 100 MG 24 HR TABLET    Take 1 tablet (100 mg)  by mouth daily    MOMETASONE  FURO-FORMOTEROL  FUM (DULERA) 200-5 MCG/ACT AEROSOL    Inhale 2 puffs into the lungs 2 (two) times daily    MORPHINE  SULFATE (MORPHINE , CONCENTRATE,) 20 MG/ML CONCENTRATED SOLUTION    Take 0.1 mLs (2 mg) by mouth every 6 (six) hours as needed (SOB)    ONETOUCH ULTRA TEST STRIP    Use As Directed    TAMSULOSIN  (FLOMAX ) 0.4 MG CAP     Take 1 capsule (0.4 mg) by mouth Daily after dinner    VITAMIN D, CHOLECALCIFEROL, 50 MCG (2000 UT) CAP    Take 2,000 Units by mouth every morning    WARFARIN (COUMADIN ) 1 MG TABLET    Take 1 tablet (1 mg) by mouth daily Follow instructions provided by [facility].    WARFARIN (COUMADIN ) 5 MG TABLET    Take 0.5 tablets (2.5 mg) by mouth every evening INR 2-3 goal       Vital Signs     BP 102/58   Pulse 89   Temp 97.5 F (36.4 C) (Oral)   Resp 14   Wt 80.8 kg   SpO2 96%   BMI 29.64 kg/m   Patient Vitals for the past 24 hrs:   BP Temp Temp src Pulse Resp SpO2 Weight   10/19/23 2100 102/58 -- -- 89 14 96 % --   10/19/23 2030 105/56 -- -- 92 15 96 % --   10/19/23 1902 -- -- -- -- -- -- 80.8 kg   10/19/23 1900 113/58 -- -- 95 -- 94 % --   10/19/23 1830 100/64 97.5 F (36.4 C) Oral 94 20 93 % --         Review of Systems       Physical Exam          CONSTITUTIONAL   Vital signs reviewed, Patient appears to be elderly chronically ill-appearing male in no obvious pain, Alert and oriented X 3.  HEAD   Atraumatic, Normocephalic.  EYES   Eyes are normal to inspection, Pupils equal, round and reactive to light, No discharge from eyes, Extraocular muscles intact, Sclera are normal, Conjunctiva normal.  ENT Mouth normal to Inspection.  NECK   Normal ROM. No jugular venous distention, No meningeal signs.  RESPIRATORY Chest is nontender, Breath sounds are diminished bilaterally but clear, No respiratory distress, Pleurx catheter is covered in the inferior left chest without any obvious drainage  CARDIOVASCULAR   RRR, No murmurs, Normal S1 S2, Rhythm is normal.  ABDOMEN   Abdomen is nontender, No masses, Bowel sounds normal, No distension, No peritoneal signs.  BACK  There is no CVA Tenderness, Normal inspection.  UPPER EXTREMITY   Inspection normal, No cyanosis, No edema.  LOWER EXTREMITY   Inspection normal, No cyanosis, No edema.  NEURO   GCS is 15, Speech normal. 5/5 ms and Nl sensation B. No cerebellar deficits. Nl  cranial exam  SKIN Skin is warm, Skin is dry, Skin is normal color.  LYMPHATIC   No adenopathy in neck.  PSYCHIATRIC Oriented X 3, Normal affect.         ED Medication Orders     ED Medication Orders (From admission, onward)      Start Ordered     Status Ordering Provider    10/19/23 2033 10/19/23 2033  iohexol  (OMNIPAQUE ) 350 MG/ML injection 80 mL  IMG once as needed        Route: Intravenous  Ordered Dose: 80 mL  Last MAR action: Imaging Agent Given Clayden Withem A    10/19/23 2011 10/19/23 2010  sodium chloride  0.9 % bolus 2,424 mL  Once        Route: Intravenous  Ordered Dose: 30 mL/kg       Acknowledged Berklie Dethlefs A    10/19/23 1909 10/19/23 1908  piperacillin -tazobactam (ZOSYN ) 4.5 g in sterile water  (preservative free) 20 mL IV push injection  Once        Route: Intravenous  Ordered Dose: 4.5 g       Last MAR action: Given Rucha Wissinger A    10/19/23 1909 10/19/23 1908  vancomycin  (VANCOCIN ) 1500 mg in sodium chloride  0.9% 500 mL (premix)  Once        Route: Intravenous  Ordered Dose: 20 mg/kg       Last MAR action: New Bag Antionio Negron A    10/19/23 1836 10/19/23 1835    Once        Route: Intravenous  Ordered Dose: 1,000 mL       Discontinued BORGES, JENNIFER L            Orders Placed During this Encounter     Orders Placed This Encounter   Procedures    Culture, Blood, Aerobic And Anaerobic    XR Chest  AP Portable    CT Chest with Contrast    CBC with Differential (Order)    Comprehensive Metabolic Panel    Magnesium     Phosphorus    Urinalysis with Reflex to Microscopic Exam and Culture    Urine Elnor Culture Hold Tube    CBC with Differential (Component)    Lactic Acid    PT/INR    Lactic Acid    Start/Continue Adult Sepsis Protocol    Repeat vital signs when bolus complete    Notify physician when bolus is complete    ECG 12 lead    Saline lock IV    Request for Transfer Center       Diagnostic Study Results     Labs     Results       Procedure Component Value Units Date/Time    PT/INR  [[id number]]  (Abnormal) Collected: 10/19/23 2012    Specimen: Blood, Venous Updated: 10/19/23 2035     PT 22.8 sec      INR 2.0    Lactic Acid [[id number]]  (Abnormal) Collected: 10/19/23 1923    Specimen: Blood, Venous Updated: 10/19/23 2003     Lactic Acid 6.5 mmol/L     Culture, Blood, Aerobic And Anaerobic [[id number]] Collected: 10/19/23 1923    Specimen: Blood, Venous Updated: 10/19/23 1928    Comprehensive Metabolic Panel [[id number]]  (Abnormal) Collected: 10/19/23 1850    Specimen: Blood, Venous Updated: 10/19/23 1926     Glucose 179 mg/dL      BUN 73 mg/dL      Creatinine 1.4 mg/dL      Sodium 865 mEq/L      Potassium 4.1 mEq/L      Chloride 86 mEq/L      CO2 29 mEq/L      Calcium  11.1 mg/dL      Anion Gap 80.9     GFR 50.2 mL/min/1.73 m2      AST (SGOT) 51 U/L      ALT 35 U/L      Alkaline Phosphatase 175 U/L      Albumin  2.2 g/dL      Protein, Total 5.9 g/dL  Globulin 3.7 g/dL      Albumin /Globulin Ratio 0.6     Bilirubin, Total 0.5 mg/dL     Magnesium  [[id number]]  (Normal) Collected: 10/19/23 1850    Specimen: Blood, Venous Updated: 10/19/23 1926     Magnesium  1.9 mg/dL     Phosphorus [[id number]]  (Normal) Collected: 10/19/23 1850    Specimen: Blood, Venous Updated: 10/19/23 1926     Phosphorus 3.8 mg/dL     CBC with Differential (Order) [[id number]]  (Abnormal) Collected: 10/19/23 1850    Specimen: Blood, Venous Updated: 10/19/23 1856    Narrative:      The following orders were created for panel order CBC with Differential (Order).  Procedure                               Abnormality         Status                     ---------                               -----------         ------                     CBC with Differential (...[[id number]]  Abnormal            Final result                 Please view results for these tests on the individual orders.    CBC with Differential (Component) [[id number]]  (Abnormal) Collected: 10/19/23 1850    Specimen: Blood, Venous Updated: 10/19/23 1856     WBC  20.40 x10 3/uL      Hemoglobin 13.3 g/dL      Hematocrit 59.2 %      Platelet Count 311 x10 3/uL      MPV 11.4 fL      RBC 4.44 x10 6/uL      MCV 91.7 fL      MCH 30.0 pg      MCHC 32.7 g/dL      RDW 16 %      nRBC % 0.0 /100 WBC      Absolute nRBC 0.00 x10 3/uL      Preliminary Absolute Neutrophil Count 18.18 x10 3/uL      Neutrophils % 89.2 %      Lymphocytes % 5.5 %      Monocytes % 4.4 %      Eosinophils % 0.0 %      Basophils % 0.1 %      Immature Granulocytes % 0.8 %      Absolute Neutrophils 18.18 x10 3/uL      Absolute Lymphocytes 1.13 x10 3/uL      Absolute Monocytes 0.90 x10 3/uL      Absolute Eosinophils 0.00 x10 3/uL      Absolute Basophils 0.02 x10 3/uL      Absolute Immature Granulocytes 0.17 x10 3/uL             Radiologic Studies  Radiology Results (24 Hour)       Procedure Component Value Units Date/Time    CT Chest with Contrast [[id number]] Collected: 10/19/23 2056    Order Status: Completed Updated: 10/19/23 2113    Narrative:      HISTORY: Shortness  of breath.    COMPARISON: CT chest, 10/09/2023.    TECHNIQUE: CT chest following IV contrast administration. The following  dose reduction techniques were utilized: automated exposure control and/or  adjustment of the mA and/or kV according to patient size, and/or the use  iterative reconstruction technique. Performed postprocessing techniques  also included multiplanar reformatting and maximal intensity projections.    CONTRAST: iohexol  (OMNIPAQUE ) 350 MG/ML injection 80 mL    FINDINGS:   Exam quality degraded by artifact arising from motion. The following  observations are made within these confines.    Index measurements:  *Left inferomedial pleural-based mass, 7.8 x 5.5 cm, (302:80), previously  7.8 x 6.0 cm.  *Right upper lobe and lateral, 1.8 x 1.6 cm nodule, (305:81), previously  1.5 x 1.4 cm.  *Right lower lobe and posterior/subpleural, 1.7 x 1.3 cm nodule, (305:71),  previously 1.4 x 1.3 cm.    Airways and lungs: Upper lung-prominent  pulmonary emphysema with partial  atelectasis of the left lung, similar to prior. The aerated portions of the  lungs demonstrate numerous pulmonary nodules, some slightly increased with  examples listed above.    Pleural space: Tunneled left-sided pleural catheter with tip at the  posterior left costophrenic sulcus. Extensive left-sided pleural tumor  deposition, similar to prior with example lesion listed above. Small to  moderate volume loculated left pleural effusion, increased.    Thyroid: Normal, where seen.    Lymph nodes: No newly enlarged lymph nodes identified within the chest.    Heart and anterior mediastinum: Status post sternotomy. Nonenlarged heart.  Nodular thickening of the pericardium, qualitatively similar to prior. No  pericardial effusion.    Vessels: Scattered atheromatous calcifications of the thoracic aorta and  within the coronary arteries. No thoracic aortic aneurysm. Variant aberrant  right subclavian artery.    Esophagus: Normal.    Superior abdomen: Left adrenal nodule, 1.9 x 1.3 cm, (302:23), unchanged.    Bones and other soft tissues: Degenerative changes of the imaged spine. No  aggressive osseous lesion or acute osseous abnormality.        Impression:         1.Extensive left-sided pleural tumor deposition with small to moderate  loculated left pleural effusion, increased with tunneled pleural catheter  in place.  2.Bilateral pulmonary nodules, some increased in size and consistent with  metastases.  3.Pericardial nodularity, similar to prior and also likely represents  metastatic disease.  4.Left adrenal nodule, similar to prior.    Elwood Miyamoto, MD  10/19/2023 9:10 PM    XR Chest  AP Portable [[id number]] Collected: 10/19/23 1949    Order Status: Completed Updated: 10/19/23 1957    Narrative:      HISTORY: Weakness and fatigue. Malignancy.    COMPARISON: 10/11/2023    FINDINGS:   Loculated left pleural effusion has increased, especially in the left upper  chest where significant fluid  is now present. The majority of the left  chest is now opacified. Underlying pleural thickening and nodularity is  appreciated on recent CT scan.    The heart is normal size. There has been prior surgery with midline  sternotomy. There is no pneumothorax.      Impression:        Significant increase of loculated left pleural effusion, now filling the  majority of the left chest. Underlying pleural thickening and nodularity is  appreciated on recent CT scan. Remainder as above.    Wolm Luis, MD  10/19/2023 7:55 PM        .  Clinical Course / MDM       Notes:        Medical Decision Making  This is a 83 year old male who presents with fatigue and decreased p.o. intake after being discharged from the hospital recently with recent significant history of flu, throat CA with mets to the lung and left pleural effusion.  Patient's family notes that decreased urine output noted with enlarged prostate    Differential diagnosis for fatigue is quite broad includes pneumonia pleural effusion dehydration AKI electrolyte derangement  sepsis obstructive uropathy UTI myocardial infarction ACS control A-fib    Labs ordered  CBC to look for occult anemia or inflammation  CMP to look for AKI metabolic acidosis, electrolyte abnormalities dehydration, hepatic injury/biliary disease  Lactic acid to look for evidence of sepsis.  Blood cultures were sent.  Urinalysis ordered to look for evidence of UTI pyelonephritis  Chest x-ray to look for intrathoracic life-threatening disease processe such as pneumonia  pleural effusion pneumothorax  EKG does not show STEMI    Labs remarkable for elevated lactic acid of 6.5 white count elevated at 20,000.  Empirically cover with appropriate antibiotics.  IV fluids given for severe sepsis.  Chest x-ray as noted below.  CT chest was ordered and has loculated effusion is noted the [facility] hospitalist accepted the patient for transfer        Amount and/or Complexity of Data Reviewed  Labs: ordered.  Decision-making details documented in ED Course.  Radiology: ordered and independent interpretation performed.     Details: Left-sided effusion noted with questionable loculated effusion versus pneumonia independent interpreted by ED provider.  Appreciate radiology overread.  This was confirmed on CT chest  ECG/medicine tests: ordered and independent interpretation performed.     Details: Rate controlled A fib, normal axis, no ST elevations, normal intervals except for the PR intervals.    Discussion of management or test interpretation with external provider(s): Discussion with the hospitalist at transfer center given the fact the patient has a loculated pleural effusion with a nonfunctioning pleurex catheter by history and noted on CT chest today. Accepted to Dr deward Silber service at 9:25 PM with interventional pulmonary consult requested    Risk  Prescription drug management.        Discussion of abnormal results/incidental findings:       Consults:      Data Review     Nursing records reviewed and agree: Yes    Pulse Oximetry Analysis - Normal  Laboratory results reviewed by EDP: Yes  Radiologic study results reviewed by EDP: Yes    Rendering Provider: Dr Lazarus    Monitors, EKG     Cardiac Monitor (interpreted by ED physician):      EKG (interpreted by ED physician):       Critical Care     Critical care exclusive of time spent performing procedures.    Total time:          Clinical Impression & Disposition     Clinical Impression:  1. Sepsis, due to unspecified organism, unspecified whether acute organ dysfunction present    2. AKI (acute kidney injury)    3. Loculated pleural effusion        Disposition  ED Disposition       None            Prescriptions    New Prescriptions    No medications on file                [  1]   Past Medical History:  Diagnosis Date    Diabetes mellitus     Hyperlipidemia     Hypertension    [2]   Past Surgical History:  Procedure Laterality Date    CARDIAC SURGERY  2004    states  quadruple bipass with stent placement    PLACEMENT, INDWELLING CATHETER PLEURAL Left 09/19/2023    Procedure: PLACEMENT, INDWELLING TUNNELED CATHETER, PLEURAL;  Surgeon: Maree Jinnie RAMAN, MD;  Location: [id number];  Service: Pulmonary;  Laterality: Left;  UNIVERSAL    SKIN BIOPSY      TONSILLECTOMY      ULTRASOUND, ENDOBRONCHIAL (EBUS) N/A 09/19/2023    Procedure: ULTRASOUND, ENDOBRONCHIAL (EBUS);  Surgeon: Maree Jinnie RAMAN, MD;  Location: [facility];  Service: Pulmonary;  Laterality: N/A;   [3]   Family History  Problem Relation Name Age of Onset    Lung cancer Brother     [4]   Allergies  Allergen Reactions    Cephalexin Rash        Talayla Doyel, Shelby LABOR, MD  10/19/23 2222

## 2023-10-19 NOTE — ED Triage Notes (Signed)
 Pt was just released from hospital on Tuesday after getting the flu. Pt had to get fluid drained off lungs and still has pleurex in left side of chest. Son brought him in bc he has had increased lethargy and has not peed for several hours and was just put on prostate medicine. Usually uses cane and now in the last week uses wheelchair and needs more assistance.

## 2023-10-19 NOTE — Telephone Encounter (Signed)
 Copied from CRM #[id number]. Topic: Clinical Support - Speak With Nurse  >> Oct 19, 2023  5:09 PM Ruxanne B wrote:  [facility], Arsal EUGENE called about Clinical Support - Speak With Nurse.  Additional details:      Pt  PT Melody called to let Dr know that pt has not urinate since 8:30 am today so she asked pt 's son the take him to the ER.    Melody callback number [phone] if you have any questions.

## 2023-10-19 NOTE — ED Provider Notes (Signed)
 I have briefly evaluated this patient as triage physician associate in order to facilitate and initiate the ordering of laboratory and imaging studies as needed.     Chief Complaint   Patient presents with    Fatigue    Loss of appetite    Generalized weakness      Brief HPI:     Pt presents with son and daughter in law.     09/10/23- patient went to [facility] ED for SOB, he was found to have a large pleural effusion and throat cancer with mets to lungs. No PE. He was admitted for 10 days and given a pleural catheter.   Pt was doing okay at home until 2/4  10/10/23- patient c/o SOB again, meds he was given during last admission provided no relief. Admitted to [facility] for SOB and flu. This past Tuesday he was discharged.     He has worsening generalized weakness, fatigue, urinating less, poor appetite, 40 lb weight loss since Thanksgiving. BPs in to 80s-90s/50-60s.    Supposed to start chemo tomorrow.     Vitals:    10/19/23 1830   BP: 100/64   Pulse: 94   Resp: 20   Temp: 97.5 F (36.4 C)   SpO2: 93%       Orders Placed This Encounter    XR Chest  AP Portable    CBC with Differential (Order)    Comprehensive Metabolic Panel    Magnesium     Phosphorus    Urinalysis with Reflex to Microscopic Exam and Culture    Urine Elnor Culture Hold Tube    sodium chloride  0.9 % bolus 1,000 mL          Carl LITTIE Manes, PA     6:39 PM       [name], [place]  10/19/23 1839

## 2023-10-19 NOTE — Progress Notes (Signed)
 [facility] [place]  OFFICE VISIT    I had the pleasure of seeing Carl [name] today for cardiovascular follow up. He is a pleasant 83 y.o. 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    Patient was hospitalized from February 3 to October 17, 2023 with acute on chronic diastolic heart failure, shortness of breath, hypoxia, atrial fibrillation with RVR and flu A.    His son called yesterday and reported that his blood pressure in the morning was 123/74 heart rate 113 bpm and after receiving his morning medications including isosorbide , Toprol  XL, diltiazem , and Bumex  his blood pressure went down to 86/41.  Recheck after 15 minutes blood pressure was 90/40.  Patient was fatigued and felt sleepy.  Later on in the day systolic blood pressure went up to 128 and heart rate of 88.  Patient at that time was instructed to hold the morning dose of Imdur  if his systolic blood pressure is less than 100.    NT proBNP on February 3 was noted to be 2425.  He was diuresed with IV Lasix .  He was started on Jardiance  10 mg once a day, HCTZ was discontinued and patient is now on Bumex  2 mg 2 times a day.    In terms of his atrial fibrillation he has known history of A-fib and is on diltiazem , beta-blocker and Coumadin .  His INR is at goal. Echocardiogram on September 15, 2023 showed EF of 73%.    Patient is on home oxygen 2 L.  His oxygen saturation is about 96%.  Patient's son reports that patient is not eating very much and has lost over 50 pounds since Thanksgiving.  He does have an appointment with his PCP in few days.  He also has an appointment with his pulmonologist next week.        MEDICATIONS:   Current Outpatient Medications:     albuterol  sulfate HFA (PROVENTIL ) 108 (90 Base) MCG/ACT inhaler, Inhale 2 puffs into  the lungs every 4 (four) hours as needed for Shortness of Breath, Disp: , Rfl:     allopurinol  (ZYLOPRIM ) 300 MG tablet, Take 1 tablet (300 mg) by mouth daily, Disp: 90 tablet, Rfl: 3    atorvastatin  (LIPITOR) 40 MG tablet, Take 1 tablet (40 mg) by mouth daily, Disp: 90 tablet, Rfl: 3    budesonide -formoterol  (SYMBICORT ) 160-4.5 MCG/ACT inhaler, Inhale 2 puffs into the lungs 2 (two) times daily, Disp: 1 each, Rfl: 0    bumetanide  (BUMEX ) 2 MG tablet, Take 1 tablet (2 mg) by mouth 2 (two) times daily, Disp: 60 tablet, Rfl: 0    clobetasol (TEMOVATE) 0.05 % cream, Apply topically daily as needed (for eczema), Disp: , Rfl:     dilTIAZem  (CARDIZEM  CD) 240 MG 24 hr capsule, Take 1 capsule (240 mg) by mouth daily, Disp: 30 capsule, Rfl: 0    dupilumab  (DUPIXENT ) 300 MG/2ML injection, Inject 300 mg into the skin every 14 (fourteen) days On tues, Disp: , Rfl:     empagliflozin  (JARDIANCE ) 10 MG tablet, Take 1 tablet (10 mg) by mouth daily, Disp: 30 tablet, Rfl: 0    glipiZIDE  (GLUCOTROL ) 10 MG 24 hr tablet, Take 1 tablet (10 mg) by mouth daily, Disp: 30 tablet, Rfl: 5    GLUCOSAMINE-CHONDROITIN PO, Take 2 tablets by mouth every morning (Glucosamine HCI 1500 mg and  Chondroitin Sulfate 1200 mg), Disp: , Rfl:     isosorbide  mononitrate (IMDUR ) 30 MG 24 hr tablet, Take 1 tablet (30 mg) by mouth daily, Disp: 90 tablet, Rfl: 3    metFORMIN  (GLUCOPHAGE -XR) 500 MG 24 hr tablet, Take 2 tablets (1,000 mg) by mouth 2 (two) times daily, Disp: 360 tablet, Rfl: 0    metoprolol  succinate (TOPROL -XL) 100 MG 24 hr tablet, Take 1 tablet (100 mg) by mouth daily, Disp: 90 tablet, Rfl: 3    Mometasone  Furo-Formoterol  Fum (Dulera) 200-5 MCG/ACT Aerosol, Inhale 2 puffs into the lungs 2 (two) times daily, Disp: , Rfl:     Morphine  Sulfate (morphine , concentrate,) 20 MG/ML concentrated solution, Take 0.1 mLs (2 mg) by mouth every 6 (six) hours as needed (SOB), Disp: 240 mL, Rfl: 0    OneTouch Ultra test strip, Use As Directed, Disp: 100 strip, Rfl:  3    tamsulosin  (FLOMAX ) 0.4 MG Cap, Take 1 capsule (0.4 mg) by mouth Daily after dinner, Disp: 30 capsule, Rfl: 0    Vitamin D, Cholecalciferol, 50 MCG (2000 UT) Cap, Take 2,000 Units by mouth every morning, Disp: , Rfl:     warfarin (COUMADIN ) 5 MG tablet, Take 0.5 tablets (2.5 mg) by mouth every evening INR 2-3 goal, Disp: , Rfl:     albuterol -ipratropium (DUO-NEB) 2.5-0.5(3) mg/3 mL nebulizer, Take 3 mLs by nebulization once for 1 dose, Disp: 3 mL, Rfl: 0    warfarin (COUMADIN ) 1 MG tablet, Take 1 tablet (1 mg) by mouth daily Follow instructions provided by [facility]. (Patient not taking: Reported on 10/19/2023), Disp: 30 tablet, Rfl: 0  No current facility-administered medications for this visit.    Facility-Administered Medications Ordered in Other Visits:     0.9% NaCl infusion, , Intravenous, Continuous PRN, Posada Londono, Adriana M, MD, New Bag at 09/19/23 1345    phenylephrine  (NEOSYNEPHRINE) 10 mcg/mL pediatric IV syringe, , Intravenous, PRN, Posada Londono, Adriana M, MD, 100 mcg at 09/19/23 1459    propofol  (DIPRIVAN ) 10 mg/mL infusion (ADULT), , Intravenous, Continuous PRN, Posada Londono, Adriana M, MD, Stopped at 09/19/23 1445    propofol  (DIPRIVAN ) injection, , Intravenous, PRN, Posada Londono, Adriana M, MD, 20 mg at 09/19/23 1452     REVIEW OF SYSTEMS: All other systems reviewed and negative except as above.    PHYSICAL EXAMINATION  General Appearance: A well-appearing male in no acute distress.   Vital Signs: BP 94/60 (BP Site: Right arm, Patient Position: Sitting, Cuff Size: Medium)   Pulse 91   Ht 1.651 m (5' 5)   Wt 78.1 kg (172 lb 1.6 oz)   BMI 28.64 kg/m    Wt Readings from Last 4 Encounters:   10/19/23 0959 78.1 kg (172 lb 1.6 oz)   10/17/23 0600 77 kg (169 lb 11.2 oz)   10/17/23 0303 77 kg (169 lb 11.2 oz)   10/16/23 1311 76.9 kg (169 lb 9.6 oz)   10/16/23 0426 76.9 kg (169 lb 9.6 oz)   10/15/23 0452 78.6 kg (173 lb 4.8 oz)   10/14/23 1500 76.2 kg (168 lb)   10/14/23 0500 76.5 kg  (168 lb 11.2 oz)   10/13/23 0846 76.6 kg (168 lb 12.8 oz)   10/12/23 0300 78.7 kg (173 lb 9.6 oz)   10/11/23 1700 77.1 kg (169 lb 15.6 oz)   10/11/23 0307 77.1 kg (169 lb 15.6 oz)   10/10/23 1800 77.1 kg (169 lb 15.6 oz)   10/10/23 0500 77.1 kg (169 lb 15.6 oz)  10/09/23 2330 81.2 kg (179 lb)   10/09/23 1146 81.2 kg (179 lb)   10/09/23 1043 81.6 kg (180 lb)   10/06/23 1146 81.2 kg (179 lb)     HEENT: Sclera anicteric, conjunctiva without pallor, moist mucous membranes, normal dentition.   Neck: Supple without jugular venous distention. Thyroid nonpalpable. Normal carotid upstrokes without bruits.  Chest: Clear to auscultation bilaterally with good air movement and respiratory effort and no wheezes, rales, or rhonchi  Cardiovascular: Normal S1 and physiologically split S2 without murmurs, gallops or rub. PMI of normal size and nondisplaced.   Abdomen: Soft, nontender. No organomegaly.  No pulsatile masses or bruits.    Extremities: Warm without edema. All peripheral pulses are full and equal.  Skin: No rash, xanthoma or xanthelasma.   Neuro: Alert and oriented x3. Grossly intact. Strength is symmetrical. Normal mood and affect.     ECG: A-fib at 82 bpm.    LABS:   Lab Results   Component Value Date    WBC 16.04 (H) 10/10/2023    HGB 12.9 10/10/2023    HCT 38.4 10/10/2023    PLT 234 10/10/2023    NA 134 (L) 10/17/2023    K 3.5 10/17/2023    MG 1.7 10/12/2023    BUN 32 (H) 10/17/2023    CREAT 0.9 10/17/2023    GLU 259 (H) 10/17/2023    CHOL 144 10/26/2022    TRIG 121 10/26/2022    HDL 45 10/26/2022    LDL 75 10/26/2022    AST 36 10/13/2023    ALT 25 10/13/2023    HGBA1C 8.6 (A) 09/27/2023    TSH 1.17 07/20/2022          IMPRESSION/RECOMMENDATIONS: Mr. Salmons is a 83 y.o. male who presents for follow up.  Patient has a history of paroxysmal atrial fibrillation, hypertension, hyperlipidemia, COPD, HFpEF, OSA, and oropharyngeal carcinoma with mets to the lungs who presents for follow-up.    Patient is very tired and  wants to lay down.  His son is encouraged to give him some protein shake while waiting to see his primary care physician.  If his symptoms are getting worse, if his blood pressure continues to be lower, if oxygen saturation is lower than 92% on 2 L of oxygen, patient is instructed to go to the ER.    Patient's son will call PCP and pulmonologist to see if he can be seen sooner.  Patient is encouraged to call our office if he has any questions or concerns.    Case will be discussed with Dr. Roz.          Plan:   Hold Isosorbide , diltiazem , Jardiance , Bumex  and metoprolol  if systolic blood pressure (top number) is less than 90 mmHg.   Bumex  helps with shortness of breath and fluid retention  Diltiazem  and metoprolol  are for atrial fibrillation and rate control.   On the day where he is retaining fluid, if you gain 2 or 3 pounds in a day or 5 pounds in a week or if he has increased shortness of breath or lower extremity edema, take an extra 2 mg of Bumex  a day.  Bumex  also lowers blood pressure so check blood pressure prior to giving him the extra dose of Bumex .     Incident to service performed with physician present in the office in accordance with this patient's established plan of care.

## 2023-10-20 ENCOUNTER — Inpatient Hospital Stay
Admission: AD | Admit: 2023-10-20 | Discharge: 2023-11-04 | DRG: 180 | Disposition: E | Payer: Medicare Other | Source: Other Acute Inpatient Hospital | Attending: Internal Medicine | Admitting: Internal Medicine

## 2023-10-20 ENCOUNTER — Ambulatory Visit: Payer: Medicare Other | Admitting: Medical Oncology

## 2023-10-20 ENCOUNTER — Other Ambulatory Visit (INDEPENDENT_AMBULATORY_CARE_PROVIDER_SITE_OTHER): Payer: Medicare Other

## 2023-10-20 ENCOUNTER — Ambulatory Visit: Payer: Medicare Other

## 2023-10-20 DIAGNOSIS — R4589 Other symptoms and signs involving emotional state: Principal | ICD-10-CM

## 2023-10-20 DIAGNOSIS — N170 Acute kidney failure with tubular necrosis: Secondary | ICD-10-CM | POA: Diagnosis present

## 2023-10-20 DIAGNOSIS — Z7951 Long term (current) use of inhaled steroids: Secondary | ICD-10-CM

## 2023-10-20 DIAGNOSIS — E43 Unspecified severe protein-calorie malnutrition: Secondary | ICD-10-CM | POA: Diagnosis present

## 2023-10-20 DIAGNOSIS — Z515 Encounter for palliative care: Secondary | ICD-10-CM

## 2023-10-20 DIAGNOSIS — C7802 Secondary malignant neoplasm of left lung: Principal | ICD-10-CM | POA: Diagnosis present

## 2023-10-20 DIAGNOSIS — R791 Abnormal coagulation profile: Secondary | ICD-10-CM | POA: Diagnosis present

## 2023-10-20 DIAGNOSIS — Z79899 Other long term (current) drug therapy: Secondary | ICD-10-CM

## 2023-10-20 DIAGNOSIS — J9621 Acute and chronic respiratory failure with hypoxia: Secondary | ICD-10-CM | POA: Diagnosis present

## 2023-10-20 DIAGNOSIS — Z87891 Personal history of nicotine dependence: Secondary | ICD-10-CM

## 2023-10-20 DIAGNOSIS — N400 Enlarged prostate without lower urinary tract symptoms: Secondary | ICD-10-CM | POA: Diagnosis present

## 2023-10-20 DIAGNOSIS — G4733 Obstructive sleep apnea (adult) (pediatric): Secondary | ICD-10-CM | POA: Diagnosis present

## 2023-10-20 DIAGNOSIS — I480 Paroxysmal atrial fibrillation: Secondary | ICD-10-CM | POA: Diagnosis present

## 2023-10-20 DIAGNOSIS — R0902 Hypoxemia: Secondary | ICD-10-CM

## 2023-10-20 DIAGNOSIS — E119 Type 2 diabetes mellitus without complications: Secondary | ICD-10-CM | POA: Diagnosis present

## 2023-10-20 DIAGNOSIS — J910 Malignant pleural effusion: Secondary | ICD-10-CM | POA: Diagnosis present

## 2023-10-20 DIAGNOSIS — C7972 Secondary malignant neoplasm of left adrenal gland: Secondary | ICD-10-CM | POA: Diagnosis present

## 2023-10-20 DIAGNOSIS — Z7984 Long term (current) use of oral hypoglycemic drugs: Secondary | ICD-10-CM

## 2023-10-20 DIAGNOSIS — J441 Chronic obstructive pulmonary disease with (acute) exacerbation: Secondary | ICD-10-CM | POA: Diagnosis present

## 2023-10-20 DIAGNOSIS — E871 Hypo-osmolality and hyponatremia: Secondary | ICD-10-CM | POA: Diagnosis present

## 2023-10-20 DIAGNOSIS — E1169 Type 2 diabetes mellitus with other specified complication: Secondary | ICD-10-CM

## 2023-10-20 DIAGNOSIS — E785 Hyperlipidemia, unspecified: Secondary | ICD-10-CM | POA: Diagnosis present

## 2023-10-20 DIAGNOSIS — Z6828 Body mass index (BMI) 28.0-28.9, adult: Secondary | ICD-10-CM

## 2023-10-20 DIAGNOSIS — R627 Adult failure to thrive: Secondary | ICD-10-CM | POA: Diagnosis present

## 2023-10-20 DIAGNOSIS — C109 Malignant neoplasm of oropharynx, unspecified: Secondary | ICD-10-CM | POA: Diagnosis present

## 2023-10-20 DIAGNOSIS — I110 Hypertensive heart disease with heart failure: Secondary | ICD-10-CM | POA: Diagnosis present

## 2023-10-20 DIAGNOSIS — J90 Pleural effusion, not elsewhere classified: Secondary | ICD-10-CM | POA: Diagnosis present

## 2023-10-20 DIAGNOSIS — C7989 Secondary malignant neoplasm of other specified sites: Secondary | ICD-10-CM | POA: Diagnosis present

## 2023-10-20 DIAGNOSIS — Z66 Do not resuscitate: Secondary | ICD-10-CM | POA: Diagnosis present

## 2023-10-20 DIAGNOSIS — D6489 Other specified anemias: Secondary | ICD-10-CM | POA: Diagnosis present

## 2023-10-20 DIAGNOSIS — Z7901 Long term (current) use of anticoagulants: Secondary | ICD-10-CM

## 2023-10-20 DIAGNOSIS — J439 Emphysema, unspecified: Principal | ICD-10-CM | POA: Diagnosis present

## 2023-10-20 DIAGNOSIS — I4892 Unspecified atrial flutter: Secondary | ICD-10-CM

## 2023-10-20 DIAGNOSIS — I5032 Chronic diastolic (congestive) heart failure: Secondary | ICD-10-CM | POA: Diagnosis present

## 2023-10-20 DIAGNOSIS — E872 Acidosis, unspecified: Secondary | ICD-10-CM | POA: Diagnosis present

## 2023-10-20 LAB — NT-PROBNP: NT-ProBNP: 2922 pg/mL — ABNORMAL HIGH (ref <450)

## 2023-10-20 LAB — ECG 12-LEAD
Q-T Interval: 360 ms
QRS Duration: 88 ms
QTC Calculation (Bezet): 447 ms
R Axis: 37 degrees
T Axis: 244 degrees
Ventricular Rate: 93 {beats}/min

## 2023-10-20 LAB — BASIC METABOLIC PANEL
Anion Gap: 12 (ref 5.0–15.0)
BUN: 71 mg/dL — ABNORMAL HIGH (ref 9–28)
CO2: 30 meq/L — ABNORMAL HIGH (ref 17–29)
Calcium: 10 mg/dL (ref 7.9–10.2)
Chloride: 96 meq/L — ABNORMAL LOW (ref 99–111)
Creatinine: 1.3 mg/dL (ref 0.5–1.5)
GFR: 54.8 mL/min/{1.73_m2} — ABNORMAL LOW (ref >=60.0)
Glucose: 169 mg/dL — ABNORMAL HIGH (ref 70–100)
Potassium: 3.4 meq/L — ABNORMAL LOW (ref 3.5–5.3)
Sodium: 138 meq/L (ref 135–145)

## 2023-10-20 LAB — WHOLE BLOOD GLUCOSE POCT
Whole Blood Glucose POCT: 186 mg/dL — ABNORMAL HIGH (ref 70–100)
Whole Blood Glucose POCT: 189 mg/dL — ABNORMAL HIGH (ref 70–100)
Whole Blood Glucose POCT: 192 mg/dL — ABNORMAL HIGH (ref 70–100)
Whole Blood Glucose POCT: 159 mg/dL — ABNORMAL HIGH (ref 70–100)

## 2023-10-20 LAB — URINALYSIS WITH MICROSCOPIC EXAM
Urine Bilirubin: NEGATIVE
Urine Blood: NEGATIVE
Urine Ketones: NEGATIVE mg/dL
Urine Leukocyte Esterase: NEGATIVE
Urine Nitrite: NEGATIVE
Urine Specific Gravity: 1.031 (ref 1.001–1.035)
Urine Urobilinogen: NORMAL mg/dL (ref 0.2–2.0)
Urine pH: 6 (ref 5.0–8.0)

## 2023-10-20 LAB — CBC
Absolute nRBC: 0 10*3/uL (ref <=0.00)
Hematocrit: 34.7 % — ABNORMAL LOW (ref 37.6–49.6)
Hemoglobin: 11.8 g/dL — ABNORMAL LOW (ref 12.5–17.1)
MCH: 30.8 pg (ref 25.1–33.5)
MCHC: 34 g/dL (ref 31.5–35.8)
MCV: 90.6 fL (ref 78.0–96.0)
MPV: 11.6 fL (ref 8.9–12.5)
Platelet Count: 242 10*3/uL (ref 142–346)
RBC: 3.83 10*6/uL — ABNORMAL LOW (ref 4.20–5.90)
RDW: 16 % — ABNORMAL HIGH (ref 11–15)
WBC: 17.62 10*3/uL — ABNORMAL HIGH (ref 3.10–9.50)
nRBC %: 0 /100{WBCs} (ref <=0.0)

## 2023-10-20 LAB — LACTIC ACID: Whole Blood Lactic Acid: 2.6 mmol/L — ABNORMAL HIGH (ref 0.2–2.0)

## 2023-10-20 LAB — NARES, MRSA (METHICILLIN-RESISTANT STAPHYLOCOCCUS AUREUS) SCREENING, PCR: MRSA (methicillin resistant Staphylococcus aureus) DNA: NOT DETECTED

## 2023-10-20 LAB — PT/INR
INR: 1.6 (ref 0.9–1.1)
PT: 17.9 s — ABNORMAL HIGH (ref 10.1–12.9)

## 2023-10-20 MED ORDER — ENOXAPARIN SODIUM 40 MG/0.4ML IJ SOSY
40.0000 mg | PREFILLED_SYRINGE | Freq: Every day | INTRAMUSCULAR | Status: DC
Start: 2023-10-20 — End: 2023-10-20

## 2023-10-20 MED ORDER — NALOXONE HCL 0.4 MG/ML IJ SOLN (WRAP)
0.2000 mg | INTRAMUSCULAR | Status: DC | PRN
Start: 2023-10-20 — End: 2023-10-24

## 2023-10-20 MED ORDER — ISOSORBIDE MONONITRATE ER 30 MG PO TB24
30.0000 mg | ORAL_TABLET | Freq: Every day | ORAL | Status: DC
Start: 2023-10-20 — End: 2023-10-22
  Administered 2023-10-20 – 2023-10-22 (×2): 30 mg via ORAL
  Filled 2023-10-20 (×3): qty 1

## 2023-10-20 MED ORDER — SALINE SPRAY 0.65 % NA SOLN
2.0000 | NASAL | Status: DC | PRN
Start: 2023-10-20 — End: 2023-10-24

## 2023-10-20 MED ORDER — GLUCOSE 40 % PO GEL (WRAP)
15.0000 g | ORAL | Status: DC | PRN
Start: 2023-10-20 — End: 2023-10-23

## 2023-10-20 MED ORDER — DILTIAZEM HCL ER COATED BEADS 240 MG PO CP24
240.0000 mg | ORAL_CAPSULE | Freq: Every day | ORAL | Status: DC
Start: 2023-10-20 — End: 2023-10-23
  Administered 2023-10-20 – 2023-10-23 (×3): 240 mg via ORAL
  Filled 2023-10-20 (×4): qty 1

## 2023-10-20 MED ORDER — WARFARIN PHARMACY TO DOSE PLACEHOLDER
ORAL | Status: DC
Start: 2023-10-20 — End: 2023-10-23

## 2023-10-20 MED ORDER — BUDESONIDE-FORMOTEROL FUMARATE 160-4.5 MCG/ACT IN AERO
2.0000 | INHALATION_SPRAY | Freq: Two times a day (BID) | RESPIRATORY_TRACT | Status: DC
Start: 2023-10-20 — End: 2023-10-23
  Administered 2023-10-20 – 2023-10-23 (×6): 2 via RESPIRATORY_TRACT
  Filled 2023-10-20 (×2): qty 6

## 2023-10-20 MED ORDER — TAMSULOSIN HCL 0.4 MG PO CAPS
0.4000 mg | ORAL_CAPSULE | Freq: Every day | ORAL | Status: DC
Start: 2023-10-20 — End: 2023-10-23
  Administered 2023-10-20 – 2023-10-22 (×3): 0.4 mg via ORAL
  Filled 2023-10-20 (×3): qty 1

## 2023-10-20 MED ORDER — BUMETANIDE 1 MG PO TABS
2.0000 mg | ORAL_TABLET | Freq: Two times a day (BID) | ORAL | Status: DC
Start: 2023-10-20 — End: 2023-10-20

## 2023-10-20 MED ORDER — BENZOCAINE-MENTHOL MT LOZG (WRAP)
1.0000 | LOZENGE | OROMUCOSAL | Status: DC | PRN
Start: 2023-10-20 — End: 2023-10-24

## 2023-10-20 MED ORDER — GLUCAGON 1 MG IJ SOLR (WRAP)
1.0000 mg | INTRAMUSCULAR | Status: DC | PRN
Start: 2023-10-20 — End: 2023-10-23

## 2023-10-20 MED ORDER — SODIUM CHLORIDE 0.9 % IV MBP
4.5000 g | Freq: Three times a day (TID) | INTRAVENOUS | Status: DC
Start: 2023-10-20 — End: 2023-10-23
  Administered 2023-10-20 – 2023-10-23 (×11): 4.5 g via INTRAVENOUS
  Filled 2023-10-20 (×11): qty 20

## 2023-10-20 MED ORDER — MORPHINE SULFATE 10 MG/5ML PO SOLN
2.5000 mg | ORAL | Status: DC | PRN
Start: 2023-10-20 — End: 2023-10-23
  Administered 2023-10-21 – 2023-10-23 (×5): 2.5 mg via ORAL
  Filled 2023-10-20 (×6): qty 5

## 2023-10-20 MED ORDER — EMPAGLIFLOZIN 10 MG PO TABS
10.0000 mg | ORAL_TABLET | Freq: Every day | ORAL | Status: DC
Start: 2023-10-20 — End: 2023-10-20

## 2023-10-20 MED ORDER — ALBUTEROL SULFATE (2.5 MG/3ML) 0.083% IN NEBU
2.5000 mg | INHALATION_SOLUTION | Freq: Four times a day (QID) | RESPIRATORY_TRACT | Status: DC | PRN
Start: 2023-10-20 — End: 2023-10-23
  Administered 2023-10-20: 2.5 mg via RESPIRATORY_TRACT
  Filled 2023-10-20: qty 3

## 2023-10-20 MED ORDER — INSULIN LISPRO 100 UNIT/ML SOLN (WRAP)
1.0000 [IU] | Freq: Every evening | Status: DC
Start: 2023-10-20 — End: 2023-10-23
  Administered 2023-10-21 – 2023-10-22 (×2): 1 [IU] via SUBCUTANEOUS
  Filled 2023-10-20 (×3): qty 3

## 2023-10-20 MED ORDER — CARBOXYMETHYLCELLULOSE SOD PF 0.5 % OP SOLN
1.0000 [drp] | Freq: Three times a day (TID) | OPHTHALMIC | Status: DC | PRN
Start: 2023-10-20 — End: 2023-10-24

## 2023-10-20 MED ORDER — VANCOMYCIN PHARMACY TO DOSE PLACEHOLDER
INTRAVENOUS | Status: DC
Start: 2023-10-20 — End: 2023-10-20

## 2023-10-20 MED ORDER — METOPROLOL SUCCINATE ER 50 MG PO TB24
100.0000 mg | ORAL_TABLET | Freq: Every day | ORAL | Status: DC
Start: 2023-10-20 — End: 2023-10-20

## 2023-10-20 MED ORDER — ALLOPURINOL 100 MG PO TABS
300.0000 mg | ORAL_TABLET | Freq: Every day | ORAL | Status: DC
Start: 2023-10-20 — End: 2023-10-23
  Administered 2023-10-20 – 2023-10-23 (×4): 300 mg via ORAL
  Filled 2023-10-20 (×4): qty 3

## 2023-10-20 MED ORDER — ALBUTEROL-IPRATROPIUM 2.5-0.5 (3) MG/3ML IN SOLN
3.0000 mL | Freq: Four times a day (QID) | RESPIRATORY_TRACT | Status: DC
Start: 2023-10-20 — End: 2023-10-23
  Administered 2023-10-20 – 2023-10-23 (×12): 3 mL via RESPIRATORY_TRACT
  Filled 2023-10-20 (×13): qty 3

## 2023-10-20 MED ORDER — DEXTROSE 10 % IV BOLUS
12.5000 g | INTRAVENOUS | Status: DC | PRN
Start: 2023-10-20 — End: 2023-10-23

## 2023-10-20 MED ORDER — ATORVASTATIN CALCIUM 40 MG PO TABS
40.0000 mg | ORAL_TABLET | Freq: Every day | ORAL | Status: DC
Start: 2023-10-20 — End: 2023-10-23
  Administered 2023-10-20 – 2023-10-23 (×4): 40 mg via ORAL
  Filled 2023-10-20 (×4): qty 1

## 2023-10-20 MED ORDER — WARFARIN SODIUM 5 MG PO TABS
2.5000 mg | ORAL_TABLET | Freq: Every day | ORAL | Status: DC
Start: 2023-10-20 — End: 2023-10-23
  Administered 2023-10-21 – 2023-10-22 (×2): 2.5 mg via ORAL
  Filled 2023-10-20 (×2): qty 1

## 2023-10-20 MED ORDER — BENZONATATE 100 MG PO CAPS
100.0000 mg | ORAL_CAPSULE | Freq: Three times a day (TID) | ORAL | Status: DC | PRN
Start: 2023-10-20 — End: 2023-10-24

## 2023-10-20 MED ORDER — ACETAMINOPHEN 325 MG PO TABS
650.0000 mg | ORAL_TABLET | ORAL | Status: DC | PRN
Start: 2023-10-20 — End: 2023-10-23
  Administered 2023-10-22 – 2023-10-23 (×5): 650 mg via ORAL
  Filled 2023-10-20 (×5): qty 2

## 2023-10-20 MED ORDER — POTASSIUM CHLORIDE CRYS ER 20 MEQ PO TBCR
20.0000 meq | EXTENDED_RELEASE_TABLET | Freq: Once | ORAL | Status: AC
Start: 2023-10-20 — End: 2023-10-20
  Administered 2023-10-20: 20 meq via ORAL
  Filled 2023-10-20: qty 1

## 2023-10-20 MED ORDER — DEXTROSE 50 % IV SOLN
12.5000 g | INTRAVENOUS | Status: DC | PRN
Start: 2023-10-20 — End: 2023-10-23

## 2023-10-20 MED ORDER — BUDESONIDE-FORMOTEROL FUMARATE 160-4.5 MCG/ACT IN AERO
2.0000 | INHALATION_SPRAY | Freq: Two times a day (BID) | RESPIRATORY_TRACT | Status: DC
Start: 2023-10-20 — End: 2023-10-20
  Administered 2023-10-20: 2 via RESPIRATORY_TRACT
  Filled 2023-10-20: qty 6

## 2023-10-20 MED ORDER — MELATONIN 3 MG PO TABS
3.0000 mg | ORAL_TABLET | Freq: Every evening | ORAL | Status: DC | PRN
Start: 2023-10-20 — End: 2023-10-23

## 2023-10-20 MED ORDER — BUMETANIDE 1 MG PO TABS
2.0000 mg | ORAL_TABLET | Freq: Every day | ORAL | Status: DC
Start: 2023-10-20 — End: 2023-10-22
  Administered 2023-10-20 – 2023-10-22 (×3): 2 mg via ORAL
  Filled 2023-10-20 (×3): qty 2

## 2023-10-20 MED ORDER — INSULIN LISPRO 100 UNIT/ML SOLN (WRAP)
1.0000 [IU] | Freq: Three times a day (TID) | Status: DC
Start: 2023-10-20 — End: 2023-10-23
  Administered 2023-10-20 – 2023-10-21 (×5): 1 [IU] via SUBCUTANEOUS
  Administered 2023-10-22: 2 [IU] via SUBCUTANEOUS
  Administered 2023-10-22: 3 [IU] via SUBCUTANEOUS
  Administered 2023-10-22: 1 [IU] via SUBCUTANEOUS
  Administered 2023-10-23 (×2): 2 [IU] via SUBCUTANEOUS
  Filled 2023-10-20 (×4): qty 3
  Filled 2023-10-20: qty 9
  Filled 2023-10-20 (×2): qty 6
  Filled 2023-10-20 (×2): qty 3
  Filled 2023-10-20: qty 6

## 2023-10-20 MED ORDER — METOPROLOL SUCCINATE ER 50 MG PO TB24
50.0000 mg | ORAL_TABLET | Freq: Every day | ORAL | Status: DC
Start: 2023-10-20 — End: 2023-10-23
  Administered 2023-10-20 – 2023-10-22 (×3): 50 mg via ORAL
  Filled 2023-10-20 (×3): qty 1

## 2023-10-20 NOTE — H&P (Signed)
 ADMISSION HISTORY AND PHYSICAL EXAM    Date Time: 10/20/23 3:43 AM  Patient Name: Carl [name]  Attending Physician: Audie Deward BIRCH, MD  Primary Care Physician: Lon Ip, MD    CC: Worsening left sided pleural effusion    Assessment:   Carl [name], Carl [name] is a 83 year old male with past medical history of T2DM, hypertension, hyperlipidemia, paroxysmal A-fib, COPD, HFpEF, OSA, and oropharyngeal carcinoma  with mets complicated with a large left pleural effusion s/p Pleurx catheter in place, initially went to [facility] for increased fatigue, generalized weakness, and decreased appetite and found to be septic with lactic of 6.5 and WBC of 20K. Also, having no output from Pleurx catheter in about 2 weeks. CT chest at [facility] with increased loculated left pleural effusion and he is transferred here for intervention pulmonology consult for possible pleural exchange.   Plan:   # Severe sepsis 2/2 loculated effusion versus pneumonia versus empyema  #Lactic acidosis, improving  #Failure to thrive 2/2 the above  -Presented with fatigue, generalized weakness, and decreased appetite x 3 days  -Found to have lactic of 6.5 with WBC of 20K at [place]  -CT chest with increased localized pleural effusion  -Continue piperacillin  and vancomycin   -Check MRSA nares  -Lactic 6.5; improving after fluid resuscitation; trend lactic  -Currently on 4 L of oxygen; uses 2 to 3 L at home  -Follow-up blood culture    #Worsening left pleural effusion  #No output from Pleurx catheter  #Recent diagnosis of oropharyngeal carcinoma with mets to the lungs complicated with large left pleural effusion s/p Pleurx catheter placement (09/19/2023)  -Reports no  output from Pleurx catheter x 2 and half weeks  -CT chest with increased loculated left pleural effusion (OSH)  -Appreciate recommendation interventional pulmonologist (message sent via secure chat)    #AKI, Cr 1.4 (0.9 ~3 days ago) likely prerenal 2/2 poor intake vs  overdiuresis  -hold diuretics for now; reevaluate and adjust dose as needed  -Received fluid resuscitation for sepsis at [facility]  -Avoid nephrotoxic agents  -Daily BMP    #Hypercalcemia, corrected with albumin  11.1 2/2 malignancy    #Normocytic anemia 2/2 hemodilution, stable  -Monitor CBC    #T2DM, A1c 8.6 (09/27/23)  -Hold home glipizide  and metformin   -Initiate low-dose sliding scale  -Accu-Cheks before meals and at bedtime  -Consistent carb diet    #Hypertension  #Paroxysmal A-fib, rate controlled  -Reports low BP readings at home  -Normotensive on arrival  -Resume metoprolol  at reduced dose at 50 mg XL daily; titrate to home dose when able to tolerate  -Resume diltiazem  to 40 mg daily  -Resume Coumadin   -Daily INR (goal 2-3)  -Continuous telemetry monitor    #COPD, not in acute exacerbation  -Resume Symbicort  twice daily  -DuoNebs every 6 hours as needed    #HFpEF, not in acute exacerbation  -Echo with 73% EF (09/15/2023)  -NT proBNP 2922 POA  -Hold Bumex  2 mg twice daily  -Hold Jardiance ; resume Imdur   -Healthy heart diet    #OSA-not on CPAP    #BPH  -Resume tamsulosin  daily      DVT ppx: On Coumadin   Foley: None present on admission  Code Status: NO CPR - SUPPORT OK      Additional Diagnoses:     Disappearing Text  Patient meets BMI criteria for a weight-based diagnosis. Patient has a high BMI of 29.64. BMI Classifications are as follows.  Overweight = BMI 25-29.9 kg/m2  Class 1 Obesity = BMI 30-34.9 kg/m2  Class 2 Obesity = BMI 35-39.9 kg/m2  Class 3 Obesity = BMI 40 or above kg/m2 or 35 and above with hypertension or diabetes mellitus  reference   :[id number]}  Patient has a BMI of 29.64 kg/m2    Diagnosis: Overweight: BMI of 25 to 29.9       Recent Labs     10/19/23  1850 10/17/23  1027   Sodium 134* 134*     Diagnosis: Mild Hyponatremia      Cr Baseline Estimation (minimum in last 3 months): 0.7 mg/dL  Maximum Cr in last 36 hours: 1.4 mg/dL    Recent Labs (Last 3 Months)     10/19/23  1850 10/17/23  1027  10/17/23  0302   Creatinine 1.4 0.9 0.9   BUN 73* 32* 31*       Acute Kidney Injury Diagnosis: Acute Kidney Injury, present on admission         Disposition:    Service status:  Inpatient: Patient requires at least 2 midnights for intervention pulmonology , possibly Pleurx catheter changed  Anticipated discharge needs: TBD    History of Presenting Illness:   Carl [name], Carl [name] is a 83 year old male with past medical history of T2DM, hypertension, hyperlipidemia, paroxysmal A-fib, COPD, HFpEF, OSA, and oropharyngeal carcinoma  with mets complicated with a large left pleural effusion s/p Pleurx catheter in place,  being transferred from [facility] ED with no drainage from his Pleurx catheter.     Patient recently had a hospitalization from 10/09/2023 to 10/17/2023 for influenza A and heart failure exacerbation with underlying A-fib with RVR.  He was diuresed and discharged on Bumex .  Patient has been feeling fatigued since discharge.  Initially family attributed the fatigue to recent hospitalization, however patient fatigue worsened associated with decreased activity tolerance and poor appetite.  Also, family noticed the Pleurx catheter has not been draining 2 and half weeks where normally it drains about 3 times a week.  Family brought patient to [facility] ED for further management of his symptoms.    In [place] ED, he was found to be septic with lactic acid of 6.5 and leukocytosis.  He was given total of 3424 mL of IV fluid and started on Zosyn  and vancomycin  patient has a CT chest done where he was found to have small to moderate loculated left pleural effusion, increased with tunneled pleural catheter in place, extensive left-sided pleural tumor deposition, bilateral pulmonary nodules increase in size.     Past Medical History:     Past Medical History:   Diagnosis Date    Diabetes mellitus     Hyperlipidemia     Hypertension        Available old records reviewed, including:  Laboratory results, imaging studies,  notes.    Past Surgical History:   Past Surgical History[1]    Family History:   Family History[2]    Social History:   Tobacco Use History[3]  Social History     Substance and Sexual Activity   Alcohol Use Not Currently     Social History     Substance and Sexual Activity   Drug Use Not Currently       Allergies:   Allergies[4]    Medications:     Home Medications       Med List Status: Provider Completed Set By: Frutoso Rous, FNP at 10/20/2023  3:21 AM      Status Comment        10/20/2023  2:28 AM  Half tab multivitamin taken as well              albuterol  sulfate HFA (PROVENTIL ) 108 (90 Base) MCG/ACT inhaler     Inhale 2 puffs into the lungs every 4 (four) hours as needed for Shortness of Breath     albuterol -ipratropium (DUO-NEB) 2.5-0.5(3) mg/3 mL nebulizer (Expired)     Take 3 mLs by nebulization once for 1 dose     allopurinol  (ZYLOPRIM ) 300 MG tablet     Take 1 tablet (300 mg) by mouth daily     atorvastatin  (LIPITOR) 40 MG tablet     Take 1 tablet (40 mg) by mouth daily     budesonide -formoterol  (SYMBICORT ) 160-4.5 MCG/ACT inhaler     Inhale 2 puffs into the lungs 2 (two) times daily     bumetanide  (BUMEX ) 2 MG tablet     Take 1 tablet (2 mg) by mouth 2 (two) times daily     clobetasol (TEMOVATE) 0.05 % cream     Apply topically daily as needed (for eczema)     dilTIAZem  (CARDIZEM  CD) 240 MG 24 hr capsule     Take 1 capsule (240 mg) by mouth daily     dupilumab  (DUPIXENT ) 300 MG/2ML injection     Inject 300 mg into the skin every 14 (fourteen) days On tues     empagliflozin  (JARDIANCE ) 10 MG tablet     Take 1 tablet (10 mg) by mouth daily     glipiZIDE  (GLUCOTROL ) 10 MG 24 hr tablet     Take 1 tablet (10 mg) by mouth daily     GLUCOSAMINE-CHONDROITIN PO     Take 2 tablets by mouth every morning (Glucosamine HCI 1500 mg and Chondroitin Sulfate 1200 mg)     isosorbide  mononitrate (IMDUR ) 30 MG 24 hr tablet     Take 1 tablet (30 mg) by mouth daily     metFORMIN  (GLUCOPHAGE -XR) 500 MG 24 hr tablet      Take 2 tablets (1,000 mg) by mouth 2 (two) times daily     metoprolol  succinate (TOPROL -XL) 100 MG 24 hr tablet     Take 1 tablet (100 mg) by mouth daily     Mometasone  Furo-Formoterol  Fum (Dulera) 200-5 MCG/ACT Aerosol     Inhale 2 puffs into the lungs 2 (two) times daily     Morphine  Sulfate (morphine , concentrate,) 20 MG/ML concentrated solution     Take 0.1 mLs (2 mg) by mouth every 6 (six) hours as needed (SOB)     OneTouch Ultra test strip     Use As Directed     tamsulosin  (FLOMAX ) 0.4 MG Cap     Take 1 capsule (0.4 mg) by mouth Daily after dinner     Vitamin D, Cholecalciferol, 50 MCG (2000 UT) Cap     Take 2,000 Units by mouth every morning     warfarin (COUMADIN ) 1 MG tablet     Take 1 tablet (1 mg) by mouth daily Follow instructions provided by [facility].     Patient not taking: Reported on 10/19/2023     warfarin (COUMADIN ) 5 MG tablet     Take 0.5 tablets (2.5 mg) by mouth every evening INR 2-3 goal              Method by which medications were confirmed on admission: face-to-face with patient.   Review of Systems:   All other systems were reviewed and are negative except: ROS     Physical Exam:  Patient Vitals for the past 24 hrs:   BP Temp Temp src Pulse Resp SpO2   10/20/23 0157 110/69 97.5 F (36.4 C) Oral 94 20 96 %     There is no height or weight on file to calculate BMI.  No intake or output data in the 24 hours ending 10/20/23 0343    Physical Exam     Labs:     Results       ** No results found for the last 24 hours. **            Imaging personally reviewed, including:   CT Chest with Contrast    Result Date: 10/19/2023   1.Extensive left-sided pleural tumor deposition with small to moderate loculated left pleural effusion, increased with tunneled pleural catheter in place. 2.Bilateral pulmonary nodules, some increased in size and consistent with metastases. 3.Pericardial nodularity, similar to prior and also likely represents metastatic disease. 4.Left adrenal nodule, similar to prior. Elwood Miyamoto, MD 10/19/2023 9:10 PM    XR Chest  AP Portable    Result Date: 10/19/2023  Significant increase of loculated left pleural effusion, now filling the majority of the left chest. Underlying pleural thickening and nodularity is appreciated on recent CT scan. Remainder as above. Wolm Luis, MD 10/19/2023 7:55 PM         Signed by: Terri Pottier, FNP  rr:[id number], [place], MD    Contact Information:  From 7am-5pm:   Primary Contact- Medicine NP/PA at [phone], pager [phone], or Epic secure chat - Specific hours/contact information per Sticky Note   Secondary- Medicine Attending  [facility] Hospitalist at pager [phone]  After 5pm-   On-Call Hospitalist at pager [phone] OR Medicine Hospitalist- Specific hours/contact information per Sticky Note          [1]   Past Surgical History:  Procedure Laterality Date    CARDIAC SURGERY  2004    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    PLACEMENT, INDWELLING CATHETER PLEURAL Left 09/19/2023    Procedure: PLACEMENT, INDWELLING TUNNELED CATHETER, PLEURAL;  Surgeon: Maree Jinnie RAMAN, MD;  Location: [id number];  Service: Pulmonary;  Laterality: Left;  UNIVERSAL    SKIN BIOPSY      TONSILLECTOMY      ULTRASOUND, ENDOBRONCHIAL (EBUS) N/A 09/19/2023    Procedure: ULTRASOUND, ENDOBRONCHIAL (EBUS);  Surgeon: Maree Jinnie RAMAN, MD;  Location: [facility];  Service: Pulmonary;  Laterality: N/A;   [2]   Family History  Problem Relation Name Age of Onset    Lung cancer Brother     [3]   Social History  Tobacco Use   Smoking Status Former    Types: Cigarettes    Passive exposure: Past   Smokeless Tobacco Not on file   Tobacco Comments    Quit smoking June 1989, smoked 30 years before   [4]   Allergies  Allergen Reactions    Cephalexin Rash

## 2023-10-20 NOTE — Progress Notes (Addendum)
 Ambulatory Care Management   Ambulatory Case Manager contacted patient in response to an automated discharge phone call. Please see call outreach encounter attached.    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     Reply 1 - Feeling better;   Reply 2 - Feeling the same;  Reply 3 - Feeling worse.    Feeling Worse (Issue Panel: Post Discharge - TCM)     Question 2   Follow-Up Scheduled 2   Do you need help scheduling a follow up appointment?     Reply 1 - No help needed;   Reply 2 - I need help.   Need Help (Issue Panel: Post Discharge - TCM)    ### Required Interventions and Feedback     Call Status         Comments::     Writer contacted patient's son Gene Bergen at the following number: [phone], for post hospitalization call.  Patient's son Gene Fray answered call. Writer introduced self to patient's son, and explained role. Son verified patient's name and DOB. (edited by JJ on 10/20/2023 08:20 AM EST)     Supplies/DME - Actions Taken         Contacted inpatient CM :     Yes (edited by JJ on 10/20/2023 08:31 AM EST)    Comments::     Writer reviewed the [facility] inpatient case manager coverage e-mail. Writer sent Epic secure chat message inpatient case manager Silvia G., for coordination of  care, to inform of discharge phone call to patient's son, patient's readmission to [facility], and son inquiry of case manager follow up.  (edited by JJ on 10/20/2023 08:33 AM EST)       General Status          General Status - Issues:     Other (edited by JJ on 10/20/2023 08:20 AM EST)    Comments:     Writer followed up on automated response of patient feeling worse. Son reports patient feeling worse yesterday.  Son reports taking patient to the [facility] emergency room, and reports patient was transferred to the [facility], and reports patient admitted.  (edited by JJ on 10/20/2023 08:21 AM EST)     General Status - Actions Taken         Other (Add Details in Comments):     Yes (edited by JJ on 10/20/2023 08:31 AM EST)    Comments:     Upon chart review, writer notes patient currently admitted to [facility].    Writer has educated son to continue following physician guidance, and to speak with his physician and nurse care team if questions arises. Son verbalized understanding.  Writer offered assistance on call.  Son reports no needs for  clinical research associate to assist.  Son inquired about case management follow up. Writer explained role of inpatient case manager, and informed son inpatient case manager will continue follow up, and also informed son, he may request to speak with case manager through nurse. Son verbalized understanding.  (edited by JJ on 10/20/2023 08:34 AM EST)    Addendum: 10/20/2023 at 11:12 am: Inpatient case manager Silvia G., responded to Epic secure chat message, and thanked clinical research associate for updates.     Bettie ALF BSN, RN ACM-RN  Case Manager  [facility]  [address]  [place], [place] [address]  T [phone]

## 2023-10-20 NOTE — H&P (Incomplete)
 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  He was recently    MRN [id number] [facility] ER) - Possible Empyema. 83 y.o. male who recently discharged from [place] for flu. In January diagnosed with throat CA with mets to the lung with large pleural effusion. Family is noting that the Pleurx catheter has not been draining very well for the past 2 weeks and only had 2 drops coming out of it 2 days ago. Increasing weakness today and is septic with lactic of 6. Chest x-ray shows worsening effusion and possible loculated effusion. He is getting a CT scan of the chest right now empirically cover with antibiotics Critical labs/imaging/vitals: As noted septic with lactic of 6. White count of 20,000. X-ray show a white out of the left lung w localized opacification of the left upper lobe questionable loculated effusion versus pneumonia or empyema Specialists/Consultants involved: Will need interventional pulmonology. Sees Dr. Maree from pulmonology (level 3)         83 y.o. male w history of *** who presents with ***    Plan:     ***    DVT ppx:  Foley:  Code Status: Prior      Additional Diagnoses:     Disappearing Text  Patient meets BMI criteria for a weight-based diagnosis. Patient has a high BMI of 29.64. BMI Classifications are as follows.  Overweight = BMI 25-29.9 kg/m2  Class 1 Obesity = BMI 30-34.9 kg/m2  Class 2 Obesity = BMI 35-39.9 kg/m2  Class 3 Obesity = BMI 40 or above kg/m2 or 35  and above with hypertension or diabetes mellitus  reference   :69553121}  Patient has a BMI of 29.64 kg/m2    Diagnosis: {BMI High:57938}     {Vanishing Tip  Sodium value is low  130-134 mEq/L = Mild Hyponatremia  120-129 mEq/L = Moderate Hyponatremia  <120 mEq/L = Severe Hyponatremia :69553121}  Recent Labs     10/19/23  1850 10/17/23  1027 10/17/23  0302   Sodium 134* 134* 134*     {Diagnosis:30448748}    {Vanishing Tip Patient may meet criteria for acute kidney injury based on KDIGO Guidelines below   Increase in Cr by >=0.3 mg/dL within 48 hours    Increase in Cr to >=1.5 times baseline or more within the last 7 days   Urine output of < 0.5 mL/kg/h for 6 hours  :69553121}  Cr Baseline Estimation (minimum in last 3 months): 0.7 mg/dL  Maximum Cr in last 36 hours: 1.4 mg/dL    Recent Labs (Last 3 Months)     10/19/23  1850 10/17/23  1027 10/17/23  0302   Creatinine 1.4 0.9 0.9   BUN 73* 32* 31*       {Acute Kidney Injury Diagnosis:304222001}        Disposition:    Service status:  {medinptobs:41043}  Anticipated discharge needs: ***     History of Presenting Illness:   Carl [name] is a 83  y.o. male who presents to the hospital with ***    Past Medical History:     Past Medical History:   Diagnosis Date   . Diabetes mellitus    . Hyperlipidemia    . Hypertension        Available old records reviewed, including:  Laboratory results, imaging studies, notes.    Past Surgical History:   Past Surgical History[1]    Family History:   Family History[2]    Social History:   Tobacco Use History[3]  Social History     Substance and Sexual Activity   Alcohol Use Not Currently     Social History     Substance and Sexual Activity   Drug Use Not Currently       Allergies:   Allergies[4]    Medications:     Home Medications               albuterol  sulfate HFA (PROVENTIL ) 108 (90 Base) MCG/ACT inhaler     Inhale 2 puffs into the lungs every 4 (four) hours as needed for Shortness of Breath     albuterol -ipratropium  (DUO-NEB) 2.5-0.5(3) mg/3 mL nebulizer (Expired)     Take 3 mLs by nebulization once for 1 dose     allopurinol  (ZYLOPRIM ) 300 MG tablet     Take 1 tablet (300 mg) by mouth daily     atorvastatin  (LIPITOR) 40 MG tablet     Take 1 tablet (40 mg) by mouth daily     budesonide -formoterol  (SYMBICORT ) 160-4.5 MCG/ACT inhaler     Inhale 2 puffs into the lungs 2 (two) times daily     bumetanide  (BUMEX ) 2 MG tablet     Take 1 tablet (2 mg) by mouth 2 (two) times daily     clobetasol (TEMOVATE) 0.05 % cream     Apply topically daily as needed (for eczema)     dilTIAZem  (CARDIZEM  CD) 240 MG 24 hr capsule     Take 1 capsule (240 mg) by mouth daily     dupilumab  (DUPIXENT ) 300 MG/2ML injection     Inject 300 mg into the skin every 14 (fourteen) days On tues     empagliflozin  (JARDIANCE ) 10 MG tablet     Take 1 tablet (10 mg) by mouth daily     glipiZIDE  (GLUCOTROL ) 10 MG 24 hr tablet     Take 1 tablet (10 mg) by mouth daily     GLUCOSAMINE-CHONDROITIN PO     Take 2 tablets by mouth every morning (Glucosamine HCI 1500 mg and Chondroitin Sulfate 1200 mg)     isosorbide  mononitrate (IMDUR ) 30 MG 24 hr tablet     Take 1 tablet (30 mg) by mouth daily     metFORMIN  (GLUCOPHAGE -XR) 500 MG 24 hr tablet     Take 2 tablets (1,000 mg) by mouth 2 (two) times daily     metoprolol  succinate (TOPROL -XL) 100 MG 24 hr tablet     Take 1 tablet (100 mg) by mouth daily     Mometasone  Furo-Formoterol  Fum (Dulera) 200-5 MCG/ACT Aerosol     Inhale 2 puffs into the lungs 2 (two) times daily     Morphine  Sulfate (morphine , concentrate,) 20 MG/ML concentrated solution     Take 0.1 mLs (2 mg) by mouth every 6 (six) hours as needed (SOB)     OneTouch Ultra test strip     Use As Directed     tamsulosin  (FLOMAX ) 0.4 MG Cap     Take  1 capsule (0.4 mg) by mouth Daily after dinner     Vitamin D, Cholecalciferol, 50 MCG (2000 UT) Cap     Take 2,000 Units by mouth every morning     warfarin (COUMADIN ) 1 MG tablet     Take 1 tablet (1 mg) by mouth daily Follow  instructions provided by [facility].     Patient not taking: Reported on 10/19/2023     warfarin (COUMADIN ) 5 MG tablet     Take 0.5 tablets (2.5 mg) by mouth every evening INR 2-3 goal              Method by which medications were confirmed on admission: face-to-face with patient. Med bottles reviewed.*** Reviewed outpatient records.    Review of Systems:   All other systems were reviewed and are negative except: ROS     Physical Exam:   Patient Vitals for the past 24 hrs:   BP Temp Temp src Pulse Resp SpO2   10/20/23 0157 110/69 97.5 F (36.4 C) Oral 94 20 96 %     There is no height or weight on file to calculate BMI.  No intake or output data in the 24 hours ending 10/20/23 0231    Physical Exam     Labs:     Results       ** No results found for the last 24 hours. **            Imaging personally reviewed, including:   CT Chest with Contrast    Result Date: 10/19/2023   1.Extensive left-sided pleural tumor deposition with small to moderate loculated left pleural effusion, increased with tunneled pleural catheter in place. 2.Bilateral pulmonary nodules, some increased in size and consistent with metastases. 3.Pericardial nodularity, similar to prior and also likely represents metastatic disease. 4.Left adrenal nodule, similar to prior. Elwood Miyamoto, MD 10/19/2023 9:10 PM    XR Chest  AP Portable    Result Date: 10/19/2023  Significant increase of loculated left pleural effusion, now filling the majority of the left chest. Underlying pleural thickening and nodularity is appreciated on recent CT scan. Remainder as above. Wolm Luis, MD 10/19/2023 7:55 PM         Signed by: Terri Pottier, FNP  rr:Yjwjqp, Hadassah, MD    Contact Information:  From 7am-5pm:   Primary Contact- Medicine NP/PA at [phone], pager [phone], or Epic secure chat - Specific hours/contact information per Sticky Note   Secondary- Medicine Attending  [facility] Hospitalist at pager [phone]  After 5pm-   On-Call Hospitalist at pager [phone] OR Medicine  Hospitalist- Specific hours/contact information per Sticky Note        [1]   Past Surgical History:  Procedure Laterality Date   . CARDIAC SURGERY  2004    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   . PLACEMENT, INDWELLING CATHETER PLEURAL Left 09/19/2023    Procedure: PLACEMENT, INDWELLING TUNNELED CATHETER, PLEURAL;  Surgeon: Maree Jinnie RAMAN, MD;  Location: [id number];  Service: Pulmonary;  Laterality: Left;  UNIVERSAL   . SKIN BIOPSY     . TONSILLECTOMY     . ULTRASOUND, ENDOBRONCHIAL (EBUS) N/A 09/19/2023    Procedure: ULTRASOUND, ENDOBRONCHIAL (EBUS);  Surgeon: Maree Jinnie RAMAN, MD;  Location: [facility];  Service: Pulmonary;  Laterality: N/A;   [2]   Family History  Problem Relation Name Age of Onset   . Lung cancer Brother     [3]   Social History  Tobacco Use   Smoking Status  Former   . Types: Cigarettes   . Passive exposure: Past   Smokeless Tobacco Not on file   Tobacco Comments    Quit smoking June 1989, smoked 30 years before   [4]   Allergies  Allergen Reactions   . Cephalexin Rash

## 2023-10-20 NOTE — Plan of Care (Signed)
 Problem: Compromised Sensory Perception  Goal: Sensory Perception Interventions  Outcome: Progressing     Problem: Compromised Moisture  Goal: Moisture level Interventions  Outcome: Progressing     Problem: Compromised Activity/Mobility  Goal: Activity/Mobility Interventions  Outcome: Progressing     Problem: Compromised Nutrition  Goal: Nutrition Interventions  Outcome: Progressing     Problem: Compromised Friction/Shear  Goal: Friction and Shear Interventions  Outcome: Progressing     Problem: Moderate/High Fall Risk Score >5  Goal: Patient will remain free of falls  Outcome: Progressing     Problem: Hemodynamic Status: Cardiac  Goal: Stable vital signs and fluid balance  Outcome: Progressing

## 2023-10-20 NOTE — Consults (Signed)
 CONSULT NOTE  Interventional Pulmonology  Team Spectra : [id number]      Date Time: 10/20/23 3:36 PM  Patient Name: Carl [name]  Attending Physician: Evelia Service, MD  Consulting Physician:  Dr. Erika   Reason for consultation: Malignant L effusion s/p pleurx now with loculated effusion       History of Present Illness:   Carl [name] is a 83 y.o. 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  He was recently admitted with flu and CHF exacerbation.  Now re-admitted with worsening SOB.  Denies fever/chills. No cough.     Continues to drain minimally from current pleurx.    CT chest yesterday shows increase in left apical loculated pleural effusion, not in contact with current pleural drain, and increase in pleural and pulmonary mets.     Labs notable for leukocytosis. INR 1.6 BNP: 2900    Son is at bedside, heavily involved in his care.     Assessment:   83 y.o. 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 Pleurx not draining much, with large loculation not in contact with current pleural tube.  Given recent flu, leukocytosis and loculated nature of effusion would rule out parapneumonic effusion vs hemothorax in the setting of multiple pleural and lung mets.       Plan:   -- Please continue to hold coumadin .   -- NPO after MN. Recommend Ct guided pigtail into large apical collection. Please sent fluid for complete analysis  -- Okay to keep pleurx capped, can drain with bottles TID for now. Little benefit to connect to [facility] at this time.  -- Remainder of care per primary team. Please call with questions       Patient seen and d/w Dr. Erika Carmell Citrin, PA - C    _____________________  IP Attending Attestation:    As the attending physician, I certify that I have personally seen and examined the patient with the APP/Fellow. I performed the  substantive portion of this visit by personally conducting the Medical DecisionMaking component, in its entirety. I have reviewed and confirmed the review of systems, relevant histories, vitals, laboratory data, radiological imaging and assessment/plan as documented and agree with additional comments below.     Gen: no acute distress  Pulm: nasal cannula at 4 L/m  Abd: non-distended    Plan:   CT guided chest tube to the L apical loculated fluid collection for fluid drainage and analysis    D. Franky Erika, DO  Interventional Pulmonology  Critical Care Medicine      Past Medical History:     Past Medical History:   Diagnosis Date    Diabetes mellitus     Hyperlipidemia     Hypertension        Past Surgical History:   Past Surgical History[1]    Family History:   Family History[2]    Social History:     Social History     Socioeconomic History    Marital status: Widowed     Spouse name: Not on file    Number of children: Not on file    Years of education: Not on file    Highest education level: Not on file   Occupational History    Not on file   Tobacco Use    Smoking status: Former  Types: Cigarettes     Passive exposure: Past    Smokeless tobacco: Not on file    Tobacco comments:     Quit smoking June 1989, smoked 30 years before   Vaping Use    Vaping status: Never Used   Substance and Sexual Activity    Alcohol use: Not Currently    Drug use: Not Currently    Sexual activity: Not Currently   Other Topics Concern    Not on file   Social History Narrative    Not on file     Social Drivers of Health     Financial Resource Strain: Low Risk  (10/10/2023)    Overall Financial Resource Strain (CARDIA)     Difficulty of Paying Living Expenses: Not hard at all   Food Insecurity: No Food Insecurity (10/09/2023)    Hunger Vital Sign     Worried About Running Out of Food in the Last Year: Never true     Ran Out of Food in the Last Year: Never true   Transportation Needs: No Transportation Needs (10/10/2023)    PRAPARE -  Therapist, Art (Medical): No     Lack of Transportation (Non-Medical): No   Physical Activity: Inactive (10/08/2023)    Exercise Vital Sign     Days of Exercise per Week: 0 days     Minutes of Exercise per Session: 20 min   Stress: No Stress Concern Present (10/08/2023)    [employer] of Occupational Health - Occupational Stress Questionnaire     Feeling of Stress : Only a little   Social Connections: Moderately Isolated (10/08/2023)    Social Connection and Isolation Panel [NHANES]     Frequency of Communication with Friends and Family: More than three times a week     Frequency of Social Gatherings with Friends and Family: More than three times a week     Attends Religious Services: More than 4 times per year     Active Member of [employer] or Organizations: No     Attends Banker Meetings: Not on file     Marital Status: Widowed   Intimate Partner Violence: Not At Risk (10/09/2023)    Humiliation, Afraid, Rape, and Kick questionnaire     Fear of Current or Ex-Partner: No     Emotionally Abused: No     Physically Abused: No     Sexually Abused: No   Housing Stability: Low Risk  (10/10/2023)    Housing Stability Vital Sign     Unable to Pay for Housing in the Last Year: No     Number of Times Moved in the Last Year: 0     Homeless in the Last Year: No       Allergies:   Allergies[3]    Medications:     Prior to Admission medications    Medication Sig Start Date End Date Taking? Authorizing Provider   albuterol  sulfate HFA (PROVENTIL ) 108 (90 Base) MCG/ACT inhaler Inhale 2 puffs into the lungs every 4 (four) hours as needed for Shortness of Breath   Yes [provider]   allopurinol  (ZYLOPRIM ) 300 MG tablet Take 1 tablet (300 mg) by mouth daily 01/18/23  Yes Alsamman, Mhd Mounaf, MD   atorvastatin  (LIPITOR) 40 MG tablet Take 1 tablet (40 mg) by mouth daily 08/09/23  Yes Roz Lamar BRAVO, MD   budesonide -formoterol  (SYMBICORT ) 160-4.5 MCG/ACT inhaler Inhale 2 puffs into the  lungs 2 (two) times daily  10/16/23  Yes Venora Damian ORN, MD   bumetanide  (BUMEX ) 2 MG tablet Take 1 tablet (2 mg) by mouth 2 (two) times daily 10/17/23  Yes Maree Silas SAUNDERS, MD   dilTIAZem  (CARDIZEM  CD) 240 MG 24 hr capsule Take 1 capsule (240 mg) by mouth daily 10/18/23  Yes Shah, Krupal R, MD   empagliflozin  (JARDIANCE ) 10 MG tablet Take 1 tablet (10 mg) by mouth daily 10/18/23  Yes Shah, Krupal R, MD   glipiZIDE  (GLUCOTROL ) 10 MG 24 hr tablet Take 1 tablet (10 mg) by mouth daily 09/27/23  Yes Lon Ip, MD   GLUCOSAMINE-CHONDROITIN PO Take 2 tablets by mouth every morning (Glucosamine HCI 1500 mg and Chondroitin Sulfate 1200 mg)   Yes [provider]   metFORMIN  (GLUCOPHAGE -XR) 500 MG 24 hr tablet Take 2 tablets (1,000 mg) by mouth 2 (two) times daily 08/10/23 11/08/23 Yes Cho, Hyegin H, MD   metoprolol  succinate (TOPROL -XL) 100 MG 24 hr tablet Take 1 tablet (100 mg) by mouth daily 01/18/23  Yes Alsamman, Mhd Mounaf, MD   Mometasone  Furo-Formoterol  Fum (Dulera) 200-5 MCG/ACT Aerosol Inhale 2 puffs into the lungs 2 (two) times daily   Yes [provider]   Morphine  Sulfate (morphine , concentrate,) 20 MG/ML concentrated solution Take 0.1 mLs (2 mg) by mouth every 6 (six) hours as needed (SOB) 10/17/23 11/06/23 Yes Maree Silas SAUNDERS, MD   OneTouch Ultra test strip Use As Directed 09/27/23  Yes Lon Ip, MD   Vitamin D, Cholecalciferol, 50 MCG (2000 UT) Cap Take 2,000 Units by mouth every morning   Yes [provider]   warfarin (COUMADIN ) 5 MG tablet Take 0.5 tablets (2.5 mg) by mouth every evening INR 2-3 goal 10/17/23  Yes Maree Silas R, MD   albuterol -ipratropium (DUO-NEB) 2.5-0.5(3) mg/3 mL nebulizer Take 3 mLs by nebulization once for 1 dose 10/16/23 10/16/23  Venora Damian ORN, MD   clobetasol (TEMOVATE) 0.05 % cream Apply topically daily as needed (for eczema) 05/23/23   [provider]   dupilumab  (DUPIXENT ) 300 MG/2ML injection Inject 300 mg into the skin every 14 (fourteen) days  On tues    [provider]   isosorbide  mononitrate (IMDUR ) 30 MG 24 hr tablet Take 1 tablet (30 mg) by mouth daily 08/09/23   Roz Lamar BRAVO, MD   tamsulosin  (FLOMAX ) 0.4 MG Cap Take 1 capsule (0.4 mg) by mouth Daily after dinner 10/16/23 11/15/23  Venora Damian ORN, MD   warfarin (COUMADIN ) 1 MG tablet Take 1 tablet (1 mg) by mouth daily Follow instructions provided by [facility].  Patient not taking: Reported on 10/19/2023 10/18/23   Roz Lamar BRAVO, MD       Review of Systems:   A comprehensive review of systems was: negative except as stated in HPI    Physical Exam:     Vitals:    10/20/23 1137   BP: 117/74   Pulse: 94   Resp: 18   Temp: 97.3 F (36.3 C)   SpO2: 94%       GEN: WD/WN  male, NAD  HEENT: atraumatic, anicteric, neck supple  LUNGS: Symmetric air entry. Good respiratory effort on 4 L NC  COR: RRR  ABDOMEN: Soft, NT/ND  EXTREMITIES: warm, no edema    Labs:     Lab Results   Component Value Date    WBC 17.62 (H) 10/20/2023    HGB 11.8 (L) 10/20/2023    HCT 34.7 (L) 10/20/2023    MCV 90.6 10/20/2023  PLT 242 10/20/2023     Lab Results   Component Value Date    NA 138 10/20/2023    K 3.4 (L) 10/20/2023    CL 96 (L) 10/20/2023    CO2 30 (H) 10/20/2023    BUN 71 (H) 10/20/2023    CREAT 1.3 10/20/2023    GLU 169 (H) 10/20/2023    CA 10.0 10/20/2023    MG 1.9 10/19/2023    PHOS 3.8 10/19/2023     Lab Results   Component Value Date    PT 17.9 (H) 10/20/2023    PTT 33 09/15/2023    INR 1.6 10/20/2023       Rads:   CT Chest with Contrast    Result Date: 10/19/2023   1.Extensive left-sided pleural tumor deposition with small to moderate loculated left pleural effusion, increased with tunneled pleural catheter in place. 2.Bilateral pulmonary nodules, some increased in size and consistent with metastases. 3.Pericardial nodularity, similar to prior and also likely represents metastatic disease. 4.Left adrenal nodule, similar to prior. Elwood Miyamoto, MD 10/19/2023 9:10 PM    XR Chest  AP Portable    Result  Date: 10/19/2023  Significant increase of loculated left pleural effusion, now filling the majority of the left chest. Underlying pleural thickening and nodularity is appreciated on recent CT scan. Remainder as above. Wolm Luis, MD 10/19/2023 7:55 PM    XR Chest AP Portable    Result Date: 10/11/2023  1. Interval increase in left perihilar opacity, left-sided pleural effusion and associated left basilar atelectasis and/or pneumonia. 2. There has been no significant change in the appearance of the right lung. 3. Reported bilateral pulmonary nodules on the CT scan are less well delineated on this study. Please refer to the CT scan for further delineation. Lindie Cunning, MD 10/11/2023 7:15 AM    CT Angio Chest (PE or Trauma Protocol)    Result Date: 10/09/2023   1. No CT evidence of pulmonary embolism. 2. Pleural-based mass and bilateral pulmonary nodules are increased in size 3. Interim insertion of left-sided chest tube with decreased loculated left-sided pleural effusion J. Charlie Hoe, MD 10/09/2023 2:46 PM    XR Chest AP Portable    Result Date: 10/05/2023   Slight decrease in left pleural effusion since prior study Jeana Sor, MD 10/05/2023 3:01 PM                [1]   Past Surgical History:  Procedure Laterality Date    CARDIAC SURGERY  2004    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    PLACEMENT, INDWELLING CATHETER PLEURAL Left 09/19/2023    Procedure: PLACEMENT, INDWELLING TUNNELED CATHETER, PLEURAL;  Surgeon: Maree Jinnie RAMAN, MD;  Location: [id number];  Service: Pulmonary;  Laterality: Left;  UNIVERSAL    SKIN BIOPSY      TONSILLECTOMY      ULTRASOUND, ENDOBRONCHIAL (EBUS) N/A 09/19/2023    Procedure: ULTRASOUND, ENDOBRONCHIAL (EBUS);  Surgeon: Maree Jinnie RAMAN, MD;  Location: [facility];  Service: Pulmonary;  Laterality: N/A;   [2]   Family History  Problem Relation Name Age of Onset    Lung cancer Brother     [3]   Allergies  Allergen Reactions    Cephalexin Rash

## 2023-10-20 NOTE — Progress Notes (Signed)
 Vancomycin  Day #1  Indication: Sepsis   -Was vancomycin  given in ED, at previous hospital, or earlier this admission? Yes  Loading Dose: 1500mg  IV x1  Maintenance Dose: TBD, dosing by levels (AKI)  Goal Trough: 15-20 or AUC 400-600  Level due: Random level ordered for 2/14@1800   Other anti-infectives: Zosyn  4.5g IV q8h  Age: 83 y.o.  Recent Labs     10/19/23  1850 10/17/23  1027   WBC 20.40*  --    Creatinine 1.4 0.9   BUN 73* 32*     Wt Readings from Last 1 Encounters:   10/19/23 80.8 kg (178 lb 2.1 oz)     Estimated Creatinine Clearance: 39.8 mL/min (based on SCr of 1.4 mg/dL).  Is renal function stable? No, AKI (baseline Scr 0.9)  Renal Replacement Therapy? No    If Yes, list type:  none  Cultures: blood cx, in process; MRSA swabs ordered    Comments/Recommendations:   83yom recently discharged from the hospital, presenting with fatigue; vanco and zosyn  started empirically for sepsis with possible PNA or UTI origin    Pt is currently in AKI and vanco will be dosed by levels    Pharmacy will continue to monitor labs, levels, cx and for changes in renal func    Reference:  Vancomycin  Dosing Guideline for Adults      Thank you for consulting pharmacy,    Fidela Mace, PharmD  [phone]

## 2023-10-20 NOTE — Progress Notes (Signed)
 Shift Note: VSS. All medications given per [facility].       Labs & MRSA swab (nares) completed on shift.     K 3.4 this AM. MD notified and aware. 20 mEq ordered and to be given at 0730, will defer to day team.     All questions answered and met. No significant events occurred.     Neuro: AOx3  Cardio:A-fib on tele  Respiratory: 3-4L NC at rest, sating low-mid 90's  Ambulation: Bed mobility x2, sits on edge of bed.   Pain: Denies at this time   Lines/Drips: 20G RAC, dressing and site C/D/I  GI/GU: Continent x2. Uses urinal   Diet: Consistent carb & heart healthy     Comments:  For patient safety precautions floor mats down, side rails up 3/4, bed alarm on, bed in lowest position, non-skid foot wear on, bedside table within reach, white board updated, room free from clutter. Pt instructed to call when needed. Call bell & pt belonging within reach. Will continue rounding per protocol.     Plan:   -IV Abx  -Trend lactic  -Monitor lytes & daily INR  -Monitor O2  -Interventional pulm consult  -Urine sample needed

## 2023-10-20 NOTE — Progress Notes (Signed)
 10/20/23 1127    Patient Type   Within 30 Days of Previous Admission? Yes   Healthcare Decisions   Interviewed: Family (Chart reviewed)    Name of interviewee if other than the pt: Jonothan Rakers (Son)   English As A Second Language Teacher Information: [phone] (Mobile)   Orientation/Decision Making Abilities of Patient Alert and Oriented x3, able to make decisions   Advance Directive Patient does not have advance directive   Prior to admission   Prior level of function Ambulates with assistive device; Independent with ADLs (SPC)   Type of Residence Private residence  (Demographics verified)   Home Layout Elevator   Have running water , electricity, heat, etc? Yes   Living Arrangements Alone    How do you get to your MD appointments? Son   How do you get your groceries? Son   Who fixes your meals? Son   Who does your laundry? Son   Who picks up your prescriptions? Son, mail order   Dressing Independent   Grooming Independent   Feeding Independent   Bathing Independent   Toileting Independent   DME Currently at [facility] [place], [place];Walker, [employer];BP Cuff;Glucometer;ADL- Grab Bars;Hospital Bed; Home O2 - [employer]   (INR monitor, rollator, built in shower seat)   Home Care/Community Services Skilled Nursing;Home Health  (Patient has a PleurX Cath (patient has supplies - son confirmed as noted [facility] for PleurX cath supplies) and has SN three times a week and PT - [facility])   Name of Agency [facility] - SN   Prior Rehab admission? (Detail) None   Discharge Planning   Support Systems Children   Patient expects to be discharged to: Home with resumption of home care services   Mode of transportation: Private car (family member)   Does the patient have perscription coverage? Yes  (Insurance verified)   Physicist, Medical Strain   How hard is it for you to pay for the very basics like food, housing, medical care, and heating? Not hard   Housing Stability   In the last 12 months, was there a time when you were not  able to pay the mortgage or rent on time? N   In the past 12 months, how many times have you moved where you were living? 0   At any time in the past 12 months, were you homeless or living in a shelter (including now)? N   Transportation Needs   In the past 12 months, has lack of transportation kept you from medical appointments or from getting medications? no   In the past 12 months, has lack of transportation kept you from meetings, work, or from getting things needed for daily living? No   Consults/Providers   PT Evaluation Needed 1   OT Evalulation Needed 2   SLP Evaluation Needed 2   Correct PCP listed in Epic? Yes   Family and PCP   PCP on file was verified as the current PCP? Yes   In case you are admitted, transferred or discharged, would like family notified? Yes   Name of family member to be notified Jonothan Rakers (Son)  [phone] [facility])   In case you are admitted, transferred or discharged, would like your PCP notified? Yes     Completed assessment via chart review.   Patient was discharged on 10/17/2023 with [facility].   Verified demographics, insurance, and PCP.     Patient is independent with ADLs, uses a SPC at [facility].   Lives alone but son is nearby and able  to support/stay with patient as needed.     DME as listed above.   Recent HH - [facility].     Family able to provide transportation home at discharge.     Allissa Albright, BSN RN  RN Case Manager I  Care Management  [facility]  [address]  [place], [place] [address]  Phone: [phone]

## 2023-10-20 NOTE — PT Eval Note (Signed)
 Physical Therapy Evaluation  Carl [name]      Post Acute Care Therapy Recommendations:     Discharge Recommendations:  Home with supervision    DME needs IF patient is discharging home: Patient already has needed equipment    Therapy discharge recommendations may change with patient status.  Please refer to most recent note for up-to-date recommendations.    Assessment:   Significant Findings: none     Carl [name] is a 83 y.o. male admitted 10/20/2023.  Patient supine in bed upon arrival and agreeable to PT. RN cleared patient for PT. Supervision for all functional mobility. Initiated sit<>stand with BUE and BLE. Ambulated 50 feet RW with supervision for safety. Demonstrated decreased cadence, stride length, and step through gait pattern. Patient primary limitation is decreased endurance and lethargy. Patient returned supine in bed with all concerns addressed and call bell in reach. Spoke to RN about patient.     Impairments: Assessment: Decreased endurance/activity tolerance;Decreased functional mobility;Gait impairment.     Therapy Diagnosis: decreased functional endurance     Rehabilitation Potential: Prognosis: Good    Treatment Activities: PT evaluation and gait training     Educated the patient to role of physical therapy, plan of care, goals of therapy and safety with mobility and ADLs.    Plan:   Treatment/Interventions: Exercise, Gait training, Functional transfer training     PT Frequency: 3-4x/wk   Risks/Benefits/POC Discussed with Pt/Family: With patient/family     [facility]  Bed: FI404/FI404-01     Precautions and Contraindications:   Weight Bearing Status: no restrictions  Other Precautions: Fall Risk    Consult received for Carl [name] for PT Evaluation and Treatment.  Patient's medical condition is appropriate for Physical therapy intervention at this time.    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 male admitted on 10/20/2023 with increased fatigue, generalized weakness, and decreased appetite and found to be septic. Per chart.     Past Medical/Surgical History:  T2DM, hypertension, hyperlipidemia, paroxysmal A-fib, COPD, HFpEF, OSA, and oropharyngeal carcinoma. Per chart.     X-Rays/Tests/Labs:  CT Chest with Contrast    Result Date: 10/19/2023   1.Extensive left-sided pleural tumor deposition with small to moderate loculated left pleural effusion, increased with tunneled pleural catheter in place. 2.Bilateral pulmonary nodules, some increased in size and consistent with metastases. 3.Pericardial nodularity, similar to prior and also likely represents metastatic disease. 4.Left adrenal nodule, similar to prior. Carl Miyamoto, MD 10/19/2023 9:10 PM    XR Chest  AP Portable    Result Date: 10/19/2023  Significant increase of loculated left pleural effusion, now filling the majority of the left chest. Underlying pleural thickening and nodularity is appreciated on recent CT scan. Remainder as above. Carl Luis, MD 10/19/2023 7:55 PM    XR Chest AP Portable    Result Date: 10/11/2023  1. Interval increase in left perihilar opacity, left-sided pleural effusion and associated left basilar atelectasis and/or pneumonia. 2. There has been no significant change in the appearance of the right lung. 3. Reported bilateral pulmonary nodules on the CT scan are less well delineated on this study. Please refer to the CT scan for further delineation. Carl Cunning, MD 10/11/2023 7:15 AM    CT Angio Chest (PE or Trauma Protocol)    Result Date: 10/09/2023  1. No CT evidence of pulmonary embolism. 2. Pleural-based mass and bilateral pulmonary nodules are increased in size 3. Interim insertion of left-sided chest tube with decreased loculated left-sided pleural effusion J. Charlie Hoe, MD 10/09/2023 2:46 PM    XR Chest AP  Portable    Result Date: 10/05/2023   Slight decrease in left pleural effusion since prior study Carl Sor, MD 10/05/2023 3:01 PM    Social History:   Prior Level of Function:  Prior level of function: Independent with ADLs, Ambulates independently  Baseline Activity Level: Community ambulation  Ambulated 100 feet or more prior to admission: Yes    Home Living Arrangements:  Living Arrangements: Alone  Type of Home: Apartment  Home Layout: One level    Subjective: I feel lightheaded.    Patient is agreeable to participation in the therapy session.     Objective:   Observation of Patient/Vital Signs:  Patient is in bed with telemetry and O2 at 4 liters/minute via nasal cannula in place.  Pt wore mask during therapy session:No      Observation of Patient/Vital signs:    Cognition/Neuro Status  Arousal/Alertness: Appropriate responses to stimuli  Attention Span: Appears intact  Orientation Level: Oriented X4  Memory: Appears intact  Following Commands: Follows all commands and directions without difficulty  Safety Awareness: independent  Insights: Fully aware of deficits  Problem Solving: Able to problem solve independently    Musculoskeletal Examination:  Gross ROM  Right Upper Extremity ROM: within functional limits  Left Upper Extremity ROM: within functional limits  Right Lower Extremity ROM: within functional limits  Left Lower Extremity ROM: within functional limits    Gross Strength  Right Upper Extremity Strength: within functional limits  Left Upper Extremity Strength: within functional limits  Right Lower Extremity Strength: within functional limits  Left Lower Extremity Strength: within functional limits    Functional Mobility:  Rolling: Supervision  Supine to Sit: Supervision  Sit to Supine: Minimal Assist  Sit to Stand: Supervision  Stand to Sit: Supervision    Ambulation:  PMP - Progressive Mobility Protocol   PMP Activity: Step 6 - Walks in Room  Distance Walked (ft) (Step 6,7): 50 Feet  RW    Ambulation: Supervision  Pattern: Step through;decreased step length;decreased cadence     Balance:  Sitting - Static: Good  Sitting - Dynamic: Good  Standing - Static: Good  Standing - Dynamic: Good    Participation and Activity Tolerance:  Participation Effort: good  Endurance: Tolerates < 10 min exercise with changes in vital signs    Patient left with call bell within reach, all needs met, SCDs off, fall mat down as found, bed alarm off as found, and all questions answered. RN notified of session outcome and patient response.     Goals:   Goals  Goal Formulation: With patient  Time for Goal Acheivement: 5 visits  Pt Will Go Supine To Sit: with supervision, to maximize functional mobility and independence, Goal met  Pt Will Perform Sit to Stand: with supervision, to maximize functional mobility and independence, Goal met  Pt Will Demo Standing Balance: 5/5 indep, moves/returns center of grav in all planes > 2, to maximize functional mobility and independence, Goal met  Pt Will Transfer Bed/Chair: with rolling walker, with supervision, to maximize functional mobility and independence  Pt Will Ambulate: > 200 feet, with rolling walker, with supervision, to maximize functional mobility and independence    PPE worn during session: gloves    Performed  by SPT with supervision from licensed PT.     Time of treatment:   PT Received On: 10/20/23  Start Time: 1550  Stop Time: 1615  Time Calculation (min): 25 min     Camellia Martinez PT, DPT [phone]

## 2023-10-20 NOTE — Progress Notes (Addendum)
 MEDICINE PROGRESS NOTE    Date Time: 10/20/23 11:34 AM  Patient Name: [name] [name]  Attending Physician: Evelia Service, MD    Assessment / Plan:   Acute on chronic hypoxic respiratory failure 4 L nasal cannula.  Likely due to accumulation of new loculated pleural effusion.  Discussed with interventional pulmonology recommend interventional radiology placed a CT-guided drain will send fluid for analysis for infections and cytology.  Continue current drain on lower chest CT chest images personally reviewed with son.  There is also progression of his underlying metastatic cancer.  De-escalate antibiotics to Zosyn  awaiting culture data  Metastatic head and neck cancer.  Lungs, pericardium and adrenal glands.  Has had poor functional status to obtain chemotherapy  Severe lactic acidemia.  Resolved with fluid resuscitation.  Chronic CHF with preserved ejection fraction.  Resume Bumex  at daily dosing to maintain euvolemia  Paroxysmal atrial fibrillation.  Diltiazem  Toprol  warfarin on hold with subtherapeutic INR at 1.6  Diabetes mellitus continue sliding scale insulin   Goals of care will reengage palliative care to assist.  COPD with acute exacerbation continue home Symbicort  DuoNeb.          Case discussed with: Patient son daughter-in-law.  They are aware of his poor prognosis.  Pulmonary    Additional Diagnoses:                 Safety Checklist:     DVT prophylaxis:  CHEST guideline (See page [id number]) Chemical   Foley:   Rn Foley protocol Not present   IVs:  Peripheral IV   PT/OT: Ordered   Daily CBC & or Chem ordered:  SHM/ABIM guidelines (see #5) Yes, due to clinical and lab instability   Reference for approximate charges of common labs: CBC auto diff - $76  BMP - $99  Mg - $79    Lines:     Patient Lines/Drains/Airways Status       Active PICC Line / CVC Line / PIV Line / Drain / Airway / Intraosseous Line / Epidural Line / ART Line / Line / Wound / Pressure Ulcer / NG/OG Tube       Name Placement  date Placement time Site Days    Peripheral IV 10/19/23 20 G Right Antecubital 10/19/23  --  Antecubital  1                     Disposition: (Please see PAF column for Expected D/C Date)   Today's date: 10/20/2023  Admit Date: 10/20/2023  1:49 AM  LOS: 0  Clinical Milestones: Treatment of loculated pleural effusion  Anticipated discharge needs: To be determined      Subjective     CC: Emphysema lung    Interval History/24 hour events: Transferred    HPI/Subjective: [name] [name]  Reports that he continues to feel short of breath not eating very well.  Continues to have significant foot and ankle edema.  Does not feel lightheaded at this time.  Son is at the bedside and provided most of the history.  No recent fevers or chills.  He did have low blood pressure yesterday he was being aggressively diuresed previously.  They are awaiting input from pulmonology.    Review of Systems:     As per HPI    Physical Exam:     VITAL SIGNS PHYSICAL EXAM   Temp:  [97.2 F (36.2 C)-97.5 F (36.4 C)] 97.2 F (36.2 C)  Heart Rate:  [85-95] 93  Resp Rate:  [  12-20] 17  BP: (96-123)/(51-75) 123/75  Blood Glucose:    Telemetry: Fibrillation      Intake/Output Summary (Last 24 hours) at 10/20/2023 1134  Last data filed at 10/20/2023 0439  Gross per 24 hour   Intake --   Output 250 ml   Net -250 ml    Physical Exam  General: awake, alert X 2  Cardiovascular: regular rate and rhythm, no murmurs, rubs or gallops  Lungs: Diminished diffusely with scattered expiratory wheezing  Abdomen: soft, non-tender, non-distended; no palpable masses,  normoactive bowel sounds  Extremities: 4+ pitting edema bilateral lower extremities  Frail chronically ill-appearing         Meds:     Medications were reviewed:  Current Facility-Administered Medications   Medication Dose Route Frequency    albuterol -ipratropium  3 mL Nebulization Q6H SCH    allopurinol   300 mg Oral Daily    atorvastatin   40 mg Oral Daily    budesonide -formoterol   2 puff  Inhalation BID    dilTIAZem   240 mg Oral Daily    insulin  lispro  1-3 Units Subcutaneous QHS    insulin  lispro  1-5 Units Subcutaneous TID AC    isosorbide  mononitrate  30 mg Oral Daily    metoprolol  succinate  50 mg Oral Daily    piperacillin -tazobactam  4.5 g Intravenous Q8H    tamsulosin   0.4 mg Oral Daily after dinner    vancomycin    Intravenous See Admin Instructions    warfarin  2.5 mg Oral Daily at 1800    warfarin   Oral See Admin Instructions     Infusion Meds[1]  PRN Medications[2]      Labs:     Labs (last 72 hours):    Recent Labs   Lab 10/20/23  0454 10/19/23  1850   WBC 17.62* 20.40*   Hemoglobin 11.8* 13.3   Hematocrit 34.7* 40.7   Platelet Count 242 311       Recent Labs   Lab 10/20/23  0454 10/19/23  2012   PT 17.9* 22.8*   INR 1.6 2.0    Recent Labs   Lab 10/20/23  0454 10/19/23  1850 10/15/23  1114 10/13/23  1755   Sodium 138 134*  More results in Results Review 137   Potassium 3.4* 4.1  More results in Results Review 3.4*   Chloride 96* 86*  More results in Results Review 88*   CO2 30* 29  More results in Results Review 35*   BUN 71* 73*  More results in Results Review 24   Creatinine 1.3 1.4  More results in Results Review 0.9   Calcium  10.0 11.1*  More results in Results Review 10.8*   Albumin   --  2.2*  --  1.9*   Protein, Total  --  5.9*  --  5.8*   Bilirubin, Total  --  0.5  --  0.6   Alkaline Phosphatase  --  175*  --  156*   ALT  --  35  --  25   AST (SGOT)  --  51*  --  36   Glucose 169* 179*  More results in Results Review 138*   More results in Results Review = values in this interval not displayed.                   Microbiology, reviewed and are significant for:  Microbiology Results (last 15 days)       Procedure Component Value Units Date/Time  Nares, MRSA (methicillin-resistant Staphylococcus aureus) Screening, PCR [[id number]]  (Normal) Collected: 10/20/23 0505    Order Status: Completed Specimen: Swab from Nares Updated: 10/20/23 0644     MRSA (methicillin resistant  Staphylococcus aureus) DNA Not Detected     Comment: Testing performed with the Xpert MRSA NxG assay. This test is intended for the detection of methicillin-resistant Staphylococcus aureus (MRSA) DNA. This test is not intended to diagnose, guide, or monitor treatment for MRSA infections, or provide results of susceptibility to methicillin. A positive test result does not necessarily indicate the presence of viable organisms. A negative result does not preclude MRSA nasal colonization.       Culture, Blood, Aerobic And Anaerobic [[id number]] Collected: 10/19/23 1923    Order Status: Resulted Specimen: Blood, Venous Updated: 10/19/23 1928    COVID-19 and Influenza ([place]) (symptomatic) [[id number]]  (Abnormal) Collected: 10/09/23 1228    Order Status: Completed Specimen: Swab from Anterior Nares Updated: 10/09/23 1327     SARS-CoV-2 (COVID-19) RNA Not Detected     Influenza A RNA Detected     Influenza B RNA Not Detected    Narrative:      A result of Detected indicates POSITIVE for the presence of viral RNA  A result of Not Detected indicates NEGATIVE for the presence of viral RNA    Test performed using the Roche cobas [place] SARS-CoV-2 & Influenza A/B assay. This is a multiplex real-time RT-PCR assay for the detection of SARS-CoV-2, influenza A, and influenza B virus RNA. Viral nucleic acids may persist in vivo, independent of viability. Detection of viral nucleic acid does not imply the presence of infectious virus, or that virus nucleic acid is the cause of clinical symptoms. Negative results do not preclude SARS-CoV-2, influenza A, and/or influenza B infection and should not be used as the sole basis for diagnosis, treatment or other patient management decisions. Invalid results may be due to inhibiting substances in the specimen and recollection should occur.             Imaging, reviewed and are significant for:  CT Chest with Contrast    Result Date: 10/19/2023   1.Extensive left-sided pleural tumor  deposition with small to moderate loculated left pleural effusion, increased with tunneled pleural catheter in place. 2.Bilateral pulmonary nodules, some increased in size and consistent with metastases. 3.Pericardial nodularity, similar to prior and also likely represents metastatic disease. 4.Left adrenal nodule, similar to prior. Elwood Miyamoto, MD 10/19/2023 9:10 PM    XR Chest  AP Portable    Result Date: 10/19/2023  Significant increase of loculated left pleural effusion, now filling the majority of the left chest. Underlying pleural thickening and nodularity is appreciated on recent CT scan. Remainder as above. Wolm Luis, MD 10/19/2023 7:55 PM         Signed by: Eleanore Buss, MD               [1]   Current Facility-Administered Medications   Medication Dose Route Frequency Last Rate   [2]   Current Facility-Administered Medications   Medication Dose Route    acetaminophen   650 mg Oral    albuterol   2.5 mg Nebulization    benzocaine -menthol   1 lozenge Buccal    benzonatate   100 mg Oral    carboxymethylcellulose sodium  1 drop Both Eyes    dextrose   15 g of glucose Oral    Or    dextrose   12.5 g Intravenous    Or    dextrose   12.5  g Intravenous    Or    glucagon  (rDNA)  1 mg Intramuscular    melatonin  3 mg Oral    naloxone   0.2 mg Intravenous    saline  2 spray Each Nare

## 2023-10-20 NOTE — Nursing Progress Note (Signed)
 Shift Note: VSS.No c/o pain. Pleural pressure and SOB relieved with nebs. PRN Morphine  ordered. Son at the bedside all shift; he was able to provide all hx that was needed.    Neuro: AOx3  Cardio:A-fib on tele  Respiratory: 3-4L NC at rest, sating low-mid 90's  Ambulation: Bed mobility x2, sits on edge of bed.   Pain: Denies at this time   Lines/Drips: 20G RAC, dressing and site C/D/I  GI/GU: Continent x2. Uses urinal   Diet: Consistent carb & heart healthy     Comments:  For patient safety precautions floor mats down, side rails up 3/4, bed alarm on, bed in lowest position, non-skid foot wear on, bedside table within reach, white board updated, room free from clutter. Pt instructed to call when needed. Call bell & pt belonging within reach. Will continue rounding per protocol.     Plan:   -IV Abx  -Trend lactic  -Monitor lytes & daily INR  -Monitor O2  -Interventional pulm consult  -NPO after MN for CT guided Pigtail plural cath

## 2023-10-20 NOTE — Progress Notes (Signed)
 Admit to INPT: Today  No expected discharge  Emphysema lung   10/20/23 1220   Case Management Quick Doc   CM Comments 2/14: dx. Emphysema lung. Respiratory: 3-4L NC at rest,     Plan:   -IV Abx  -Trend lactic  -Monitor lytes & daily INR  -Monitor O2  -Interventional pulm consult  -Urine sample needed. Dispo: pending. Home O2-[facility]     Discharge plan: Home with HH.   ROC order pending ([facility]).     CM will continue to monitor for changes with discharge.     Auston Flynn Sanders, BSN, RN  RN Case Manager  [facility]

## 2023-10-20 NOTE — Progress Notes (Addendum)
 Report received from Olivia, [place]. Pt arrived via EMS on 2-3L NC with son at bedside at 0200. VSS. Dual skin assessment completed. Pt oriented to room and unit. Bed in lowest position, bed alarm on, call bell within reach and floor mats placed. All questions answered and met.

## 2023-10-20 NOTE — Progress Notes (Signed)
 Skin assessment Upon Admission/Transfer:     Dual skin check with  ______Darby, RN_____________                 Date and time __0200 on 2/14______     Patient on NC and HFNC        _X__ Yes         ___   NO          Comments ___2-3L NC, sating low-mid 90's______     Assessed  behind the Ears        __X_  Yes      ___  NO         Comments ____Blanchable reddness______       Assessed  bilateral Elbow        _X__ Yes         ___   NO          Comments ___Blanchable reddness_______        Assessed  Sacrum                   __X_  Yes        ____ NO         Comments _____Blanchable reddness_____     Assessed Bilateral Heel            __X__  Yes     ____ NO         Comments __Blanchable reddness and cracking BL heels________     Assessed under devices eg SCD's, trach           ____ Yes              ___ NO     Date wound identified:  ___________     ______________________________     _______________________________     WOCN notified Y/N     Date _____________           Key: Yes: If able to assess skin, Comments section used if wound found or any breakdown noted.            No: If patient refused or nurse unable to complete assessment

## 2023-10-20 NOTE — Plan of Care (Signed)
 Problem: Compromised Sensory Perception  Goal: Sensory Perception Interventions  10/20/2023 1104 by Fronie Etha Helling, RN  Outcome: Adequate for Discharge  10/20/2023 1104 by Fronie Etha Helling, RN  Outcome: Progressing     Problem: Compromised Moisture  Goal: Moisture level Interventions  10/20/2023 1104 by Fronie Etha Helling, RN  Outcome: Adequate for Discharge  10/20/2023 1104 by Fronie Etha Helling, RN  Outcome: Progressing     Problem: Compromised Activity/Mobility  Goal: Activity/Mobility Interventions  10/20/2023 1104 by Fronie Etha Helling, RN  Outcome: Adequate for Discharge  10/20/2023 1104 by Fronie Etha Helling, RN  Outcome: Progressing     Problem: Compromised Nutrition  Goal: Nutrition Interventions  10/20/2023 1104 by Fronie Etha Helling, RN  Outcome: Adequate for Discharge  10/20/2023 1104 by Fronie Etha Helling, RN  Outcome: Progressing     Problem: Compromised Friction/Shear  Goal: Friction and Shear Interventions  10/20/2023 1104 by Fronie Etha Helling, RN  Outcome: Adequate for Discharge  10/20/2023 1104 by Fronie Etha Helling, RN  Outcome: Progressing     Problem: Moderate/High Fall Risk Score >5  Goal: Patient will remain free of falls  10/20/2023 1104 by Fronie Etha Helling, RN  Outcome: Adequate for Discharge  10/20/2023 1104 by Fronie Etha Helling, RN  Outcome: Progressing     Problem: Hemodynamic Status: Cardiac  Goal: Stable vital signs and fluid balance  10/20/2023 1104 by Fronie Etha Helling, RN  Outcome: Adequate for Discharge  10/20/2023 1104 by Fronie Etha Helling, RN  Outcome: Progressing

## 2023-10-21 ENCOUNTER — Encounter: Payer: Self-pay | Admitting: Internal Medicine

## 2023-10-21 ENCOUNTER — Inpatient Hospital Stay: Payer: Medicare Other

## 2023-10-21 DIAGNOSIS — R0600 Dyspnea, unspecified: Secondary | ICD-10-CM

## 2023-10-21 DIAGNOSIS — K5901 Slow transit constipation: Secondary | ICD-10-CM

## 2023-10-21 DIAGNOSIS — Z515 Encounter for palliative care: Secondary | ICD-10-CM

## 2023-10-21 DIAGNOSIS — G893 Neoplasm related pain (acute) (chronic): Secondary | ICD-10-CM

## 2023-10-21 DIAGNOSIS — R630 Anorexia: Secondary | ICD-10-CM

## 2023-10-21 DIAGNOSIS — Z7189 Other specified counseling: Secondary | ICD-10-CM

## 2023-10-21 DIAGNOSIS — E43 Unspecified severe protein-calorie malnutrition: Secondary | ICD-10-CM

## 2023-10-21 LAB — LAB USE ONLY - BODY FLUID CELL COUNT
Body Fluid Lymphocytes: 7 %
Body Fluid Monocyte/Macrophage Cells: 70 %
Body Fluid Polymorphonuclear Cells: 23 % (ref <25)
Body Fluid RBC: <2000 /mm3
Body Fluid Total Nucleated Cells: 227 /mm3
Body Fluid WBC: 217 /mm3 (ref <500)

## 2023-10-21 LAB — WHOLE BLOOD GLUCOSE POCT
Whole Blood Glucose POCT: 190 mg/dL — ABNORMAL HIGH (ref 70–100)
Whole Blood Glucose POCT: 194 mg/dL — ABNORMAL HIGH (ref 70–100)
Whole Blood Glucose POCT: 200 mg/dL — ABNORMAL HIGH (ref 70–100)
Whole Blood Glucose POCT: 183 mg/dL — ABNORMAL HIGH (ref 70–100)

## 2023-10-21 LAB — BODY FLUID PROTEIN: Body Fluid Protein: 2.9 g/dL

## 2023-10-21 LAB — BASIC METABOLIC PANEL
Anion Gap: 12 (ref 5.0–15.0)
BUN: 64 mg/dL — ABNORMAL HIGH (ref 9–28)
CO2: 29 meq/L (ref 17–29)
Calcium: 10.4 mg/dL — ABNORMAL HIGH (ref 7.9–10.2)
Chloride: 97 meq/L — ABNORMAL LOW (ref 99–111)
Creatinine: 1.4 mg/dL (ref 0.5–1.5)
GFR: 50.2 mL/min/{1.73_m2} — ABNORMAL LOW (ref >=60.0)
Glucose: 177 mg/dL — ABNORMAL HIGH (ref 70–100)
Potassium: 2.9 meq/L — ABNORMAL LOW (ref 3.5–5.3)
Sodium: 138 meq/L (ref 135–145)

## 2023-10-21 LAB — CBC
Absolute nRBC: 0 10*3/uL (ref <=0.00)
Hematocrit: 33.7 % — ABNORMAL LOW (ref 37.6–49.6)
Hemoglobin: 11.3 g/dL — ABNORMAL LOW (ref 12.5–17.1)
MCH: 30.5 pg (ref 25.1–33.5)
MCHC: 33.5 g/dL (ref 31.5–35.8)
MCV: 91.1 fL (ref 78.0–96.0)
MPV: 11.8 fL (ref 8.9–12.5)
Platelet Count: 238 10*3/uL (ref 142–346)
RBC: 3.7 10*6/uL — ABNORMAL LOW (ref 4.20–5.90)
RDW: 16 % — ABNORMAL HIGH (ref 11–15)
WBC: 22.31 10*3/uL — ABNORMAL HIGH (ref 3.10–9.50)
nRBC %: 0 /100{WBCs} (ref <=0.0)

## 2023-10-21 LAB — BODY FLUID LACTATE DEHYDROGENASE (LDH): Body Fluid LDH: 1163 U/L

## 2023-10-21 LAB — BODY FLUID TRIGLYCERIDES: Body Fluid Triglyceride: 26 mg/dL

## 2023-10-21 LAB — BODY FLUID GLUCOSE: Body Fluid Glucose: 106 mg/dL

## 2023-10-21 LAB — LAB USE ONLY - URINE GRAY CULTURE HOLD TUBE

## 2023-10-21 LAB — BODY FLUID CHOLESTEROL: Body Fluid Cholesterol: 53 mg/dL

## 2023-10-21 LAB — PT/INR
INR: 1.8 (ref 0.9–1.1)
PT: 20.7 s — ABNORMAL HIGH (ref 10.1–12.9)

## 2023-10-21 MED ORDER — POTASSIUM CHLORIDE 10 MEQ/100ML IV SOLN (WRAP)
10.0000 meq | Freq: Once | INTRAVENOUS | Status: AC
Start: 2023-10-21 — End: 2023-10-21
  Administered 2023-10-21: 10 meq via INTRAVENOUS
  Filled 2023-10-21: qty 100

## 2023-10-21 MED ORDER — POTASSIUM CHLORIDE 20 MEQ PO PACK
0.0000 meq | PACK | ORAL | Status: DC | PRN
Start: 2023-10-21 — End: 2023-10-23

## 2023-10-21 MED ORDER — MAGNESIUM SULFATE IN D5W 1-5 GM/100ML-% IV SOLN
1.0000 g | INTRAVENOUS | Status: DC | PRN
Start: 2023-10-21 — End: 2023-10-24

## 2023-10-21 MED ORDER — DRONABINOL 2.5 MG PO CAPS
2.5000 mg | ORAL_CAPSULE | Freq: Two times a day (BID) | ORAL | Status: DC
Start: 2023-10-21 — End: 2023-10-23
  Administered 2023-10-21 – 2023-10-23 (×4): 2.5 mg via ORAL
  Filled 2023-10-21 (×4): qty 1

## 2023-10-21 MED ORDER — POTASSIUM & SODIUM PHOSPHATES 280-160-250 MG PO PACK
2.0000 | PACK | ORAL | Status: DC | PRN
Start: 2023-10-21 — End: 2023-10-23

## 2023-10-21 MED ORDER — FENTANYL CITRATE (PF) 50 MCG/ML IJ SOLN (WRAP)
50.0000 ug | INTRAMUSCULAR | Status: AC | PRN
Start: 2023-10-21 — End: 2023-10-21

## 2023-10-21 MED ORDER — POTASSIUM CHLORIDE 10 MEQ/100ML IV SOLN (WRAP)
10.0000 meq | INTRAVENOUS | Status: DC | PRN
Start: 2023-10-21 — End: 2023-10-23

## 2023-10-21 MED ORDER — LIDOCAINE HCL 1 % IJ SOLN
INTRAMUSCULAR | Status: AC | PRN
Start: 2023-10-21 — End: 2023-10-21
  Administered 2023-10-21: 10 mL

## 2023-10-21 MED ORDER — POTASSIUM CHLORIDE CRYS ER 20 MEQ PO TBCR
40.0000 meq | EXTENDED_RELEASE_TABLET | ORAL | Status: AC
Start: 2023-10-21 — End: 2023-10-21
  Administered 2023-10-21 (×2): 40 meq via ORAL
  Filled 2023-10-21 (×2): qty 2

## 2023-10-21 MED ORDER — ONDANSETRON HCL 4 MG PO TABS
4.0000 mg | ORAL_TABLET | Freq: Three times a day (TID) | ORAL | Status: DC
Start: 2023-10-21 — End: 2023-10-23
  Administered 2023-10-21 – 2023-10-23 (×6): 4 mg via ORAL
  Filled 2023-10-21 (×7): qty 1

## 2023-10-21 MED ORDER — POTASSIUM CHLORIDE CRYS ER 20 MEQ PO TBCR
0.0000 meq | EXTENDED_RELEASE_TABLET | ORAL | Status: DC | PRN
Start: 2023-10-21 — End: 2023-10-23

## 2023-10-21 NOTE — Progress Notes (Signed)
 MEDICINE PROGRESS NOTE    Date Time: 10/21/23 11:51 AM  Patient Name: Carl [name]  Attending Physician: Evelia Service, MD    Assessment / Plan:   Acute on chronic hypoxic respiratory failure 4 L nasal cannula.  Likely due to accumulation of new loculated pleural effusion.  Interventional radiology placed a CT-guided drain 2/15.  Continue current drain on lower chest.  There is also progression of his underlying metastatic cancer.  De-escalate antibiotics to Zosyn  awaiting culture data from new drain.  Metastatic head and neck cancer.  Lungs, pericardium and adrenal glands.  Has had poor functional status to obtain chemotherapy.  Will get hematology oncology to visit family to assist with goals of care  Severe lactic acidemia.  Resolved with fluid resuscitation.  Chronic CHF with preserved ejection fraction.  Resume Bumex  at daily dosing to maintain euvolemia  Paroxysmal atrial fibrillation.  Diltiazem  Toprol  warfarin on hold for procedure now resumed with subtherapeutic INR at 1.8.  Bridging not warranted.  Diabetes mellitus continue sliding scale insulin   Goals of care appreciate palliative care to assist.  COPD with acute exacerbation continue home Symbicort  DuoNeb.  Goals of care discussed with son and daughter in law outside the room.  Options are to continue aggressive care in the hopes that he will regain functional status for chemotherapy.  Alternately can engage with palliative care.  Hospice would be another option considering he has had a progressive decline over the last 6-week which will take a prolonged duration for recovery of functional status if at all possible.          Case discussed with: Patient son daughter-in-law.  They are aware of his poor prognosis.  Pulmonary    Additional Diagnoses:                 Safety Checklist:     DVT prophylaxis:  CHEST guideline (See page [id number]) Chemical   Foley:  [place] Rn Foley protocol Not present   IVs:  Peripheral IV   PT/OT: Ordered   Daily CBC &  or Chem ordered:  SHM/ABIM guidelines (see #5) Yes, due to clinical and lab instability   Reference for approximate charges of common labs: CBC auto diff - $76  BMP - $99  Mg - $79    Lines:     Patient Lines/Drains/Airways Status       Active PICC Line / CVC Line / PIV Line / Drain / Airway / Intraosseous Line / Epidural Line / ART Line / Line / Wound / Pressure Ulcer / NG/OG Tube       Name Placement date Placement time Site Days    Peripheral IV 10/19/23 20 G Right Antecubital 10/19/23  --  Antecubital  1                     Disposition: (Please see PAF column for Expected D/C Date)   Today's date: 10/21/2023  Admit Date: 10/20/2023  1:49 AM  LOS: 1  Clinical Milestones: Treatment of loculated pleural effusion  Anticipated discharge needs: To be determined      Subjective     CC: Emphysema lung    Interval History/24 hour events: Had CT-guided drain in the left upper chest    HPI/Subjective: Carl [name]  Reports that he feels a little bit better with respect to dyspnea when treated with pain medications.  Continues to have significant foot and ankle edema.  Does not feel lightheaded at this time.  Son is  at the bedside and provided most of the history.  No recent fevers or chills.     Review of Systems:     As per HPI    Physical Exam:     VITAL SIGNS PHYSICAL EXAM   Temp:  [97.2 F (36.2 C)-97.7 F (36.5 C)] 97.3 F (36.3 C)  Heart Rate:  [94-110] 95  Resp Rate:  [14-20] 14  BP: (93-124)/(64-74) 105/71  FiO2 (%):  [66 %-88 %] 88 %  Blood Glucose:    Telemetry: Atrial fibrillation      Intake/Output Summary (Last 24 hours) at 10/21/2023 1151  Last data filed at 10/21/2023 1025  Gross per 24 hour   Intake 100 ml   Output 1050 ml   Net -950 ml    Physical Exam  General: Lethargic alert X 2  Cardiovascular: regular rate and rhythm, no murmurs, rubs or gallops  Lungs: Diminished diffusely with scattered expiratory wheezing  Abdomen: soft, non-tender, non-distended; no palpable masses,  normoactive  bowel sounds  Extremities: 4+ pitting edema bilateral lower extremities  Frail chronically ill-appearing         Meds:     Medications were reviewed:  Current Facility-Administered Medications   Medication Dose Route Frequency    albuterol -ipratropium  3 mL Nebulization Q6H SCH    allopurinol   300 mg Oral Daily    atorvastatin   40 mg Oral Daily    budesonide -formoterol   2 puff Inhalation BID    bumetanide   2 mg Oral Daily    dilTIAZem   240 mg Oral Daily    dronabinol   2.5 mg Oral BID    insulin  lispro  1-3 Units Subcutaneous QHS    insulin  lispro  1-5 Units Subcutaneous TID AC    isosorbide  mononitrate  30 mg Oral Daily    metoprolol  succinate  50 mg Oral Daily    ondansetron   4 mg Oral TID AC    piperacillin -tazobactam  4.5 g Intravenous Q8H    tamsulosin   0.4 mg Oral Daily after dinner    warfarin  2.5 mg Oral Daily at 1800    warfarin   Oral See Admin Instructions     Infusion Meds[1]  PRN Medications[2]      Labs:     Labs (last 72 hours):    Recent Labs   Lab 10/21/23  0339 10/20/23  0454   WBC 22.31* 17.62*   Hemoglobin 11.3* 11.8*   Hematocrit 33.7* 34.7*   Platelet Count 238 242       Recent Labs   Lab 10/21/23  0339 10/20/23  0454   PT 20.7* 17.9*   INR 1.8 1.6    Recent Labs   Lab 10/21/23  0339 10/20/23  0454 10/19/23  1850   Sodium 138 138 134*   Potassium 2.9* 3.4* 4.1   Chloride 97* 96* 86*   CO2 29 30* 29   BUN 64* 71* 73*   Creatinine 1.4 1.3 1.4   Calcium  10.4* 10.0 11.1*   Albumin   --   --  2.2*   Protein, Total  --   --  5.9*   Bilirubin, Total  --   --  0.5   Alkaline Phosphatase  --   --  175*   ALT  --   --  35   AST (SGOT)  --   --  51*   Glucose 177* 169* 179*  Microbiology, reviewed and are significant for:  Microbiology Results (last 15 days)       Procedure Component Value Units Date/Time    Culture And Gram Stain, Aerobic Bacteria, Wound/Tissue/Fluid [[id number]]     Order Status: Sent Specimen: Pleural Fluid from Pleural Cavity, Left     Culture And Gram Stain, Aerobic  Bacteria, Wound/Tissue/Fluid [[id number]]     Order Status: Canceled Specimen: Pleural Fluid from Pleural Cavity, Left     Culture and Smear, Acid Fast Bacilli (AFB/Mycobacteria)(Order) [[id number]] Collected: 10/21/23 1010    Order Status: Sent Specimen: Pleural Fluid from Pleural Cavity, Left Updated: 10/21/23 1049    Narrative:      The following orders were created for panel order Culture and Smear, Acid Fast Bacilli (AFB/Mycobacteria)(Order).  Procedure                               Abnormality         Status                     ---------                               -----------         ------                     Culture, Acid Fast Baci.SABRA.[[id number]]                      In process                   Please view results for these tests on the individual orders.    Culture and Smear, Fungal (Order) [[id number]] Collected: 10/21/23 1010    Order Status: Sent Specimen: Pleural Fluid from Pleural Cavity, Left Updated: 10/21/23 1049    Narrative:      The following orders were created for panel order Culture and Smear, Fungal (Order).  Procedure                               Abnormality         Status                     ---------                               -----------         ------                     Culture, Fungal (Compon.SABRA.[[id number]]                      In process                   Please view results for these tests on the individual orders.    Culture, Acid Fast Bacilli (AFB/Mycobacteria)(Component) [[id number]] Collected: 10/21/23 1010    Order Status: Sent Specimen: Pleural Fluid from Pleural Cavity, Left Updated: 10/21/23 1049    Culture, Fungal (Component) [[id number]] Collected: 10/21/23 1010    Order Status: Sent Specimen: Pleural Fluid from Pleural Cavity, Left Updated: 10/21/23 1049    Culture + Gram Stain,Aerobic, Body Fluid [[id number]]  Order Status: Sent Specimen: Body Fluid from Pleural Fluid     Nares, MRSA (methicillin-resistant Staphylococcus aureus) Screening, PCR [[id number]]   (Normal) Collected: 10/20/23 0505    Order Status: Completed Specimen: Swab from Nares Updated: 10/20/23 0644     MRSA (methicillin resistant Staphylococcus aureus) DNA Not Detected     Comment: Testing performed with the Xpert MRSA NxG assay. This test is intended for the detection of methicillin-resistant Staphylococcus aureus (MRSA) DNA. This test is not intended to diagnose, guide, or monitor treatment for MRSA infections, or provide results of susceptibility to methicillin. A positive test result does not necessarily indicate the presence of viable organisms. A negative result does not preclude MRSA nasal colonization.       Culture, Blood, Aerobic And Anaerobic [[id number]] Collected: 10/19/23 1923    Order Status: Completed Specimen: Blood, Venous Updated: 10/21/23 0100     Culture Blood No growth at 1 day    COVID-19 and Influenza ([place]) (symptomatic) [[id number]]  (Abnormal) Collected: 10/09/23 1228    Order Status: Completed Specimen: Swab from Anterior Nares Updated: 10/09/23 1327     SARS-CoV-2 (COVID-19) RNA Not Detected     Influenza A RNA Detected     Influenza B RNA Not Detected    Narrative:      A result of Detected indicates POSITIVE for the presence of viral RNA  A result of Not Detected indicates NEGATIVE for the presence of viral RNA    Test performed using the Roche cobas [place] SARS-CoV-2 & Influenza A/B assay. This is a multiplex real-time RT-PCR assay for the detection of SARS-CoV-2, influenza A, and influenza B virus RNA. Viral nucleic acids may persist in vivo, independent of viability. Detection of viral nucleic acid does not imply the presence of infectious virus, or that virus nucleic acid is the cause of clinical symptoms. Negative results do not preclude SARS-CoV-2, influenza A, and/or influenza B infection and should not be used as the sole basis for diagnosis, treatment or other patient management decisions. Invalid results may be due to inhibiting substances in the specimen and  recollection should occur.             Imaging, reviewed and are significant for:  XR Chest AP Portable    Result Date: 10/21/2023  1. Minimal increase in left basilar opacity suggestive of increase in left sided pleural effusion with associated atelectasis and/or pneumonia. 2. Left upper lobe fluid collection is unchanged. 3. The remainder is as above. Lindie Cunning, MD 10/21/2023 7:26 AM         Signed by: Eleanore Buss, MD               [1]   Current Facility-Administered Medications   Medication Dose Route Frequency Last Rate   [2]   Current Facility-Administered Medications   Medication Dose Route    acetaminophen   650 mg Oral    albuterol   2.5 mg Nebulization    benzocaine -menthol   1 lozenge Buccal    benzonatate   100 mg Oral    carboxymethylcellulose sodium  1 drop Both Eyes    dextrose   15 g of glucose Oral    Or    dextrose   12.5 g Intravenous    Or    dextrose   12.5 g Intravenous    Or    glucagon  (rDNA)  1 mg Intramuscular    fentaNYL  (PF)  50 mcg Intravenous    magnesium  sulfate  1 g Intravenous    melatonin  3 mg Oral  morphine   2.5 mg Oral    naloxone   0.2 mg Intravenous    potassium & sodium phosphates   2 packet Oral    potassium chloride   0-60 mEq Oral    Or    potassium chloride   0-60 mEq Oral    Or    potassium chloride   10 mEq Intravenous    saline  2 spray Each Nare

## 2023-10-21 NOTE — Plan of Care (Signed)
 Problem: Compromised Sensory Perception  Goal: Sensory Perception Interventions  Outcome: Progressing     Problem: Compromised Moisture  Goal: Moisture level Interventions  Outcome: Progressing     Problem: Compromised Activity/Mobility  Goal: Activity/Mobility Interventions  Outcome: Progressing     Problem: Compromised Nutrition  Goal: Nutrition Interventions  Outcome: Progressing     Problem: Compromised Friction/Shear  Goal: Friction and Shear Interventions  Outcome: Progressing     Problem: Moderate/High Fall Risk Score >5  Goal: Patient will remain free of falls  Outcome: Progressing     Problem: Hemodynamic Status: Cardiac  Goal: Stable vital signs and fluid balance  Outcome: Progressing

## 2023-10-21 NOTE — ACP (Advance Care Planning) (Signed)
 Goals of Care / Advance Care Planning   CURRENT CPR Status: NO CPR - SUPPORT OK        Advanced Care Planning:      Decisional Capacity: yes      Advance Directives have been completed in the past: yes      Advance Directives are available in chart: yes      Discussed Advance Directives this admission: no      ACP note completed: no                Medical Decision Maker: Next of Kin status: son: Karver Fadden       GOC Discussion:      Participants:  [name]'s on  [name]-Pt's DIL     Gene and [name] voiced their concerns over Kyndall's clinical state. They provided a summary of pt's decline since November detailing that he has lost significant weight (approx 40 lbs) since then and his functional capacity has steadily dropped, as well. They recount that patient was able to walk with a cane on [birth year] for his birthday and now needs assistance/dependence for basic needs.     In addition, now requires 2-person assist for the restroom and also requires all the toileting to be done by someone else.      Ignace is not eating. He enjoys soup but will only take 2-4 spoonfuls before exclaiming that he has had enough.      We went over Gerhardt's wishes last week when he said he was interested in pursuing outpatient cancer directed treatment. I expressed my concerns that this may no longer be possible with such a poor performance status. I added that my Med Onc colleagues would also need to evaluate/assess him and make their own determinations.     With their permission, I gave them an overview of hospice services. I discussed the hospice philosophy of a symptom based and comfort-oriented approach. I explained the settings of hospice, including routine hospice (home or nursing facility overlay) , IPU, and GIP. Described interdisciplinary team of hospice including social work, [facility], [place], and physician. We discussed on call services of hospice as a resource for family members after hours.      Gene mentions Josmar has funds that can  be put to use for a Nursing Home or nurses to come into his residence.      We agreed to await Med Onc recommendations to further discuss next steps with patient.     I have spent 30 minutes (Start: 12:45 p.m.    End:  1:15 p.m.   ) on face-to-face Advance Care Planning Services. 100% of the time was spent on discussion and counseling the son and daughter in law. No active management of the problems listed above was undertaken during the time period reported.          Outcome of Discussion: TBD

## 2023-10-21 NOTE — Nursing Progress Note (Signed)
 Shift Note: VSS. Pt c/o not feeling well in general. Morphine  given PRN, for WOB with relief. Family at beds side all shift. Palliative met with the pt and family today.     Pt was feeling anxious, MD notified, he stated that we needed to try non medical interventions.        Neuro: A&Ox4  Cardiac: on tele with a fib  Resp: 4L NC sating in mid 90s.   GI/GU: cont x2. Pt uses urinal at bedside  Mobility: x2 assist  Pain: pt denies at this time  Vascular Access: PIV 20g RFA, site and dressing C/D/I   Plan:   - IV abx  - Monitor electrolytes  - Wean O2 as tolerated  - Nebs

## 2023-10-21 NOTE — Nursing Progress Note (Signed)
 Shift Note: VSS. Meds given per MAR. All needs and concerns addressed. POC ongoing.     Pt was feeling very anxious and restless this AM, Tele-ICU made aware  A one time dose of PO Xanax  given.     Chest xray completed this AM.     Neuro: A&Ox4, very drowsy this shift but easily arousable. Forgetful at times.  Cardiac: on tele with a fib  Resp: 4L NC sating in mid 90s.   GI/GU: cont x2. Pt uses urinal at bedside  Mobility: x2 assist  Pain: pt denies at this time  LDAs: PIV 20g RFA, site and dressing C/D/I. L sided chest tube, -20 to suction, dressing C/D/I. Total output this shift: 30 mL.   Plan:   - IV abx  - Monitor electrolytes  - Wean O2 as tolerated  - Nebs        For patient safety precautions floor mats down, side rails up 3/4, bed alarm on, bed in lowest position, non-skid foot wear on, bedside table within reach, white board updated, room free from clutter. Pt instructed to call when needed. Call bell & pt belonging within reach

## 2023-10-21 NOTE — Progress Notes (Signed)
 WARFARIN MANAGEMENT    Carl [name] is a 83 y.o. male presenting with L pleural effusion c/b severe sepsis.     Indication: stroke prevention secondary to atrial fibrillation   INR goal: 2 - 3    This is a continuation of home warfarin.  Warfarin counseling is deferred at this time as it is a home medication. Pharmacy will counsel if requested.  Warfarin dose PTA: 2.5 mg daily   Outpatient warfarin management team: [facility]     Recent Labs     10/21/23  9660 10/20/23  0454 10/19/23  2012 10/19/23  1850   Hemoglobin 11.3* 11.8*  --  13.3   Hematocrit 33.7* 34.7*  --  40.7   Platelet Count 238 242  --  311   INR 1.8 1.6 2.0  --      Recent Labs     10/21/23  0339 10/20/23  0454 10/19/23  1850   Creatinine 1.4 1.3 1.4   BUN 64* 71* 73*   Albumin   --   --  2.2*   AST (SGOT)  --   --  51*   ALT  --   --  35   Bilirubin, Total  --   --  0.5     Estimated Creatinine Clearance: 39.5 mL/min (based on SCr of 1.4 mg/dL).    ASSESSMENT   INR is subtherapeutic at 1.8. The INR has increased 0.2 from yesterday's morning draw.  The patient is not currently being bridged.  Potential drug-drug interactions include: None identified  Diet: Regular diet    PLAN   Recommend to continue warfarin 2.5 mg.  Consider bridging if INR continues to decline tomorrow.  The next INR level is ordered to be collected with AM labs.    Adult Inpatient Warfarin Dosing Guideline    [place]lin [place], PharmD, [facility]  Cardiology Pharmacist  [phone] or EpicChat

## 2023-10-21 NOTE — Consults (Signed)
 [facility] Palliative Medicine & Comprehensive Care  Service Phone Number: FX: [phone], Mon-Fri 8a-4:30p / Xtend Pager: #[phone]      Palliative Care Consult     Date Time: 10/21/23 9:46 AM   Patient Name: Carl [name], Carl [name]   Location: [id number]/QP595-98   Attending Physician: Evelia Service, MD   Primary Care Physician: Lon Ip, MD   Consulting Provider: Alfonso JAYSON Kill, MD   Consulting Service: Palliative Medicine and Comprehensive Care  Consult request from Evelia Service, MD to see patient regarding:   Reason for Referral: Symptom (dyspnea, nausea, anxiety, fatigue, etc.)     Palliative Diagnosis Category: Cancer  Patient Type: Return  All copy pasted portions were reviewed and updated if warranted.       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 He was admitted from 02/03-02/11/25 for Flu A and CHF. Important to note he also has history of COPD, Afib. Seen by Palliative Care Team (Dr. Geni Plumber) last admission where a GOC meeting was held and a POST form was filled out.    Palliative Medicine consulted for assistance with Reason for Referral: Symptom (dyspnea, nausea, anxiety, fatigue, etc.)     Secure Epic chat message sent to primary attending notifying them of my visit and recommendations.       Recommendations   Plan      #Metastatic Oropharyngeal Ca  -Follows with Dr. Jeffie  -Was scheduled to start treatment 02/13  -Med Onc not yet consulted this admission. Secure message sent to primary attending recommending Med Onc consult.     1. Goals of Care:   Patient currently NO CPR - SUPPORT OK (confirmed by Palliative Care)  No POA form scanned into Epic  POST form was completed last admission on 10/13/23, please click here to review:Advance Care Planning Navigator   I was able to have an extensive conversation with patient's son, Gene, and his DIL, [name], regarding  Carl [name]'s current clinical state and their expectations. Please see separate ACP note for details. In summary, both are concerned that patient will not recover enough to make it to more treatment and wonder if next step in plan of care should be hospice.     ACP Validation: Advance care planning discussion initialized  Surrogate Decision Maker: Next of Kin status: son: [name]  ACP Document: DDNR/POST    2. Pain: Patient voiced having pain all over and reports morphine  is working well to alleviate his discomfort.  PMP reviewed and there are no prescribing concerns. Has a script from last admission for oral morphine  concentrate.   OME: 5 mg.  -Recommend continuing with morphine  oral solution 2.5 mg q 2 hours PRN  -Recommend adding morphine  IVP 1 mg q 2 hous PRN in the event PO is only partially effective or if patient needs rapid relief  -Continue tylenol  650 mg q 4 hours PRN for mild pain    These medication requires intensive monitoring for drug toxicity and will be monitored for ADE e.g. over somnolence, constipation, respiratory depression.     - OMEs calculated using updated opioid conversion table (McPherson ML. Demystifying Opioid Conversion Calculations, A Guide for Effective Dosing, 2nd Edition. ASHP; 2018.)    3. Non-Pain Symptoms:   Dyspnea:   -Opioids as above for dyspnea  -Nebulizations, symbicort  per primary team  -Management of volume state/HF with diuretics per primary team    Slow  Transit Constipation:   LBM was this morning  Recommend senna 1 tab nightly PRN  Miralax daily PRN    Severe protein calorie malnutrition: Alb of  2.2 with Wt of 79.6 kg and BMI of 29.20  -Patient has lost ~40 lbs since November  -Poor PO intake  -recommend Ensure supplements with every meal (to be taken into account if fluid restricted)  -Patient started on marinol  2.5 mg BID    Nausea/Vomiting:  -Recommend Compazine 5mg  IV q4h PRN Vomiting    Mood/Agitation: Calm, not combative, agitated, or restless. No current  need for psychotropics.     Delirium: Not present. At risk for hospital acquired delirium.     Reviewed Social Determinants of Health: Yes / No : Finding of concern: [ ]  None  [ ]  Financial  [ ]  Education [ ]  Healthcare [ ]  Environment [ x] Social/community    4. Psychosocial: Patient was living alone. Son has been taking care of him for the last 6 weeks. Son, Gene, is an only child. Patient is widowed. Other source of support is his DIL, [name].     5. Spiritual: Baptist.   6. 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 He was admitted from 02/03-02/11/25 for Flu A and CHF. Important to note he also has history of COPD, Afib. Seen by Palliative Care Team (Dr. Geni Plumber) last admission where a GOC meeting was held and a POST form was filled out.    Palliative Medicine consulted for assistance with Reason for Referral: Symptom (dyspnea, nausea, anxiety, fatigue, etc.)    Met patient and his family at the bedside.    Patient had returned from his CT guided L sided chest tube insertion where 750cc of serous fluid was drained.  Patient reports being able to breathe better. He notes having diffuse pain that responds well to PRN morphine .  LBM was today.  Slept as well as can be expected.  Was NPO past midnight and had lunch delivered during my visit. Reports has low appetite.    Patient verbalized feeling tired and gave permission for me to speak to his family  outside the room.      Goals of Care / Advance Care Planning   CURRENT CPR Status: NO CPR - SUPPORT OK       Advanced Care Planning:      Decisional Capacity: yes      Advance Directives have been completed in the past: yes      Advance Directives are available in chart: yes      Discussed Advance Directives this admission: no      ACP note completed: no               Medical Decision Maker: Next of Kin status: son: Nicolas Banh      GOC Discussion:     Participants:  [name]'s on  [name]-Pt's DIL  Gene and [name] voiced their concerns over Tiquan's clinical state. They provided a summary of pt's decline since November detailing that he has lost significant weight (approx 40 lbs) since then and his functional capacity has steadily dropped, as well. They recount that patient was able to walk with a cane on [birth year] for his birthday and now needs assistance/dependence for basic needs.    In addition, now requires 2-person assist for the restroom and also requires all the toileting to be done by someone else.     Louise is not eating. He enjoys soup but will only take 2-4 spoonfuls before exclaiming that he has had enough.     We went over Qadir's wishes last week when he said he was interested in pursuing outpatient cancer directed treatment. I expressed my concerns that this may no longer be possible with such a poor performance status. I added that my Med Onc colleagues would also need to evaluate/assess him and make their own determinations.    With their permission, I gave them an overview of hospice services. I discussed the hospice philosophy of a symptom based and comfort-oriented approach. I explained the settings of hospice, including routine hospice (home or nursing facility overlay) , IPU, and GIP. Described interdisciplinary team of hospice including social work, [facility], [place], and physician. We discussed on call services of hospice as a resource for family members after hours.     Gene mentions Maliq has  funds that can be put to use for a Nursing Home or nurses to come into his residence.     We agreed to await Med Onc recommendations to further discuss next steps with patient.    I have spent 30 minutes (Start: 12:45 p.m.    End:  1:15 p.m.   ) on face-to-face Advance Care Planning Services. 100% of the time was spent on discussion and counseling the son and daughter in law. No active management of the problems listed above was undertaken during the time period reported.         Outcome of Discussion: TBD        Palliative Functional and Symptom Assessment    ADLs prior to admission:    Independent Needs assistance Dependent   Ambulation []  []  [x]    Transferring []  []  [x]    Dressing []  []  [x]    Bathing []  []  [x]    Toileting []  []  [x]    Feeding []  [x]  []      Palliative Performance Scale: 40% - Mainly in bed, unable to do any work, extensive disease, mainly assistance with self-care, normal or reduced intake, full LOC, drowsy or confusion    FAST Score (Dementia Patients): N/A    ECOG (Cancer Patients): 4 - Completed disabled, cannot carry on any self-care, totally confined to bed or chair    [place] Symptom Assessment Scale (ESAS): Completed       Review of Systems   [x]  As per HPI and physical exam, otherwise all systems negative    Pain:  present - adequately treated          Objective       Past Medical, Surgical and Family History   Medical History[1]   Past Surgical History[2]   Family History[3]    Social History   Substance Use:    reports that he has quit smoking. His smoking use included cigarettes. He has been exposed to tobacco smoke. He does not have any smokeless tobacco history on file.    reports that he does  not currently use alcohol.    reports that he does not currently use drugs.     Marital Status: widowed    Home/Family: Patient was living alone. Son has been taking care of him for the last 6 weeks. Son, Gene, is an only child. Patient is widowed. Other source of support is his DIL, [name].      Cultural Concerns:    Translator needed: [x]  NO   []  YES                                    Language: English  ID#_________    Spirituality and Importance: Baptist         Medications   Scheduled Meds  Current Facility-Administered Medications   Medication Dose Route Frequency    albuterol -ipratropium  3 mL Nebulization Q6H SCH    allopurinol   300 mg Oral Daily    atorvastatin   40 mg Oral Daily    budesonide -formoterol   2 puff Inhalation BID    bumetanide   2 mg Oral Daily    dilTIAZem   240 mg Oral Daily    insulin  lispro  1-3 Units Subcutaneous QHS    insulin  lispro  1-5 Units Subcutaneous TID AC    isosorbide  mononitrate  30 mg Oral Daily    metoprolol  succinate  50 mg Oral Daily    piperacillin -tazobactam  4.5 g Intravenous Q8H    tamsulosin   0.4 mg Oral Daily after dinner    [Held by provider] warfarin  2.5 mg Oral Daily at 1800    [Held by provider] warfarin   Oral See Admin Instructions      DRIPS     PRN MEDS  PRN Medications[4]    Allergies   Allergies[5]    Physical Exam   BP 93/67   Pulse 95   Temp 97.2 F (36.2 C) (Axillary)   Resp 18   Ht 1.651 m (5' 5)   Wt 79.6 kg (175 lb 8 oz)   SpO2 97%   BMI 29.20 kg/m    Physical Exam:  General: well developed, chronically ill appearing male laying in bed in NAD  HEENT:  EOMI, sclera anicteric, OP/OC clear without lesions or thrush, MMM. Soft, weak voice  Neck: supple, FROM   CV: irreg irreg, no murmurs, rubs   Lungs:on NC. Left chest tube appreciated  Abd: soft, NT, ND, +bs, no rebound or guarding  Ext: large pitting le edema bilaterally   Neuro: awake, alert, oriented x 3, no focal deficits, appears very tired  Psych:  appropriate insight and judgement, no psychomotor agitation     Labs / Radiology   Lab and diagnostics: reviewed in Epic  Recent Labs   Lab 10/21/23  0339   WBC 22.31*   Hemoglobin 11.3*   Hematocrit 33.7*   Platelet Count 238       Recent Labs   Lab 10/21/23  0339   PT 20.7*   INR 1.8        Recent Labs   Lab 10/21/23  0339   Sodium  138   Potassium 2.9*   Chloride 97*   CO2 29   BUN 64*   Creatinine 1.4   GFR 50.2*   Glucose 177*   Calcium  10.4*     Recent Labs   Lab 10/19/23  1850   Bilirubin, Total 0.5   Protein, Total 5.9*   Albumin  2.2*   ALT  35   AST (SGOT) 51*          XR Chest AP Portable    Result Date: 10/21/2023  1. Minimal increase in left basilar opacity suggestive of increase in left sided pleural effusion with associated atelectasis and/or pneumonia. 2. Left upper lobe fluid collection is unchanged. 3. The remainder is as above. Lindie Cunning, MD 10/21/2023 7:26 AM    CT Chest with Contrast    Result Date: 10/19/2023   1.Extensive left-sided pleural tumor deposition with small to moderate loculated left pleural effusion, increased with tunneled pleural catheter in place. 2.Bilateral pulmonary nodules, some increased in size and consistent with metastases. 3.Pericardial nodularity, similar to prior and also likely represents metastatic disease. 4.Left adrenal nodule, similar to prior. Elwood Miyamoto, MD 10/19/2023 9:10 PM    XR Chest  AP Portable    Result Date: 10/19/2023  Significant increase of loculated left pleural effusion, now filling the majority of the left chest. Underlying pleural thickening and nodularity is appreciated on recent CT scan. Remainder as above. Wolm Luis, MD 10/19/2023 7:55 PM                  [1]   Past Medical History:  Diagnosis Date    Diabetes mellitus     Hyperlipidemia     Hypertension    [2]   Past Surgical History:  Procedure Laterality Date    CARDIAC SURGERY  2004    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    PLACEMENT, INDWELLING CATHETER PLEURAL Left 09/19/2023    Procedure: PLACEMENT, INDWELLING TUNNELED CATHETER, PLEURAL;  Surgeon: Maree Jinnie RAMAN, MD;  Location: [id number];  Service: Pulmonary;  Laterality: Left;  UNIVERSAL    SKIN BIOPSY      TONSILLECTOMY      ULTRASOUND, ENDOBRONCHIAL (EBUS) N/A 09/19/2023    Procedure: ULTRASOUND, ENDOBRONCHIAL (EBUS);  Surgeon: Maree Jinnie RAMAN, MD;   Location: [facility];  Service: Pulmonary;  Laterality: N/A;   [3]   Family History  Problem Relation Name Age of Onset    Lung cancer Brother     [4]   Current Facility-Administered Medications   Medication Dose    acetaminophen   650 mg    albuterol   2.5 mg    benzocaine -menthol   1 lozenge    benzonatate   100 mg    carboxymethylcellulose sodium  1 drop    dextrose   15 g of glucose    Or    dextrose   12.5 g    Or    dextrose   12.5 g    Or    glucagon  (rDNA)  1 mg    magnesium  sulfate  1 g    melatonin  3 mg    morphine   2.5 mg    naloxone   0.2 mg    potassium & sodium phosphates   2 packet    potassium chloride   0-60 mEq    Or    potassium chloride   0-60 mEq    Or    potassium chloride   10 mEq    saline  2 spray   [5]   Allergies  Allergen Reactions    Cephalexin Rash

## 2023-10-21 NOTE — Procedures (Signed)
 PROCEDURE NOTE      PROCEDURE:   Left chest tube  Procedure pause performed    ANESTHESIA:   Fentanyl  and local lidocaine       POST-OPERATIVE DIAGNOSIS:   Pleural effusion      OPERATIVE FINDINGS:     Serous fluid    SPECIMENS REMOVED:     750 ml, sent for ordered labs    TOTAL CONTRAST VOLUME:     None    ESTIMATED BLOOD LOSS:     Min.    COMPLICATIONS:   None        Signed by Norleen JAYSON Confer, MD.

## 2023-10-21 NOTE — Nursing Progress Note (Signed)
 Shift Note: Report received from Adeola, RN at around 0145. VSS. Meds given per MAR. All needs and concerns addressed. POC ongoing. Pt has been NPO since MN for CT guided pleural cath.     PRN Morphine  given x2 for WOB with therapeutic effect.     Pt's AM K+ at 2.9, Tele-ICU notified. Replacements ordered. A one time dose of IV Potassium Phosphate given and one dose of the PO 40mEQ K+ tablet given. Pt still has one more dose of the PO K+ tablet this AM. Will pass onto dayshift RN to complete last dose.     Pt was feeling very anxious and restless this AM, Tele-ICU made aware. A Stat chest xray was ordered and completed.     Neuro: A&Ox4  Cardiac: on tele with a fib  Resp: 4L NC sating in mid 90s.   GI/GU: cont x2. Pt uses urinal at bedside  Mobility: x2 assist  Pain: pt denies at this time  Vascular Access: PIV 20g RFA, site and dressing C/D/I   Plan:   - NPO since MN for CT guided pleural cath  - IV abx  - Monitor electrolytes  - Wean O2 as tolerated  - Nebs      For patient safety precautions floor mats down, side rails up 3/4, bed alarm on, bed in lowest position, non-skid foot wear on, bedside table within reach, white board updated, room free from clutter. Pt instructed to call when needed. Call bell & pt belonging within reach.

## 2023-10-21 NOTE — H&P (Signed)
 BRIEF PRE PROCEDURE H&P      PROCEDURALIST COMMENTS BELOW:   Previous adverse reaction to anesthesia or sedation: No    Medications reviewed    Patient identification was confirmed via stated name, birth date, and wrist band check.    INDICATIONS:   Metastatic oropharyngeal Ca with loculated L pleural effusion. Lower L chest tube with minimal output per son. New large upper L collection separate from lower, additional chest tube requested.      REVIEW OF SYSTEMS:   YES  (x  )         ALLERGIES:     NO  (   )   YES  ( X  )          PHYSICAL EXAM     AIRWAY CLASSIFICATION:    CLASS I   (   )     CLASS II  (  X )    CLASS III  (   )     CLASS IV  (   )    Assessed by anesthesiologist (  )      Intubated/tracheotomy (  )      CARDIAC :   ( X  ) Inpatient (   ) Outpatient--nothing acute (   ) Other    LUNGS:     ( X  ) Inpatient (   ) Outpatient--nothing acute (   ) Other      ABDOMEN:   ( X  ) Inpatient (   ) Outpatient--nothing acute (   ) Other      NEURO:   ( X  ) Inpatient (   ) Outpatient--nothing acute (   ) Other      OTHER:   Do resuscitate for any acute complication during the procedure.    LABS:     Lab Results   Component Value Date/Time    WBC 22.31 (H) 10/21/2023 03:39 AM    WBC 10.69 (H) 12/23/2022 10:09 AM    HCT 33.7 (L) 10/21/2023 03:39 AM    HCT 41.4 12/23/2022 10:09 AM    PLT 238 10/21/2023 03:39 AM    PLT 231 12/23/2022 10:09 AM    INR 1.8 10/21/2023 03:39 AM    INR 4.0 (A) 10/07/2023 12:00 AM    INR 3.1 (H) 02/08/2023 11:05 AM    PT 20.7 (H) 10/21/2023 03:39 AM    PT 36.0 (H) 02/08/2023 11:05 AM    PTT 33 09/15/2023 05:21 PM    BUN 64 (H) 10/21/2023 03:39 AM    BUN 16.0 02/08/2023 11:05 AM    GLU 177 (H) 10/21/2023 03:39 AM    GLU 111 (H) 02/08/2023 11:05 AM    K 2.9 (L) 10/21/2023 03:39 AM    K 4.7 02/08/2023 11:05 AM           ASA PHYSICAL STATUS   (  )  ASA 1   HEALTHY PATIENT  (  )  ASA 2   MILD SYSTEMIC ILLNESS  (  )  ASA 3   SYSTEMIC DISEASE, NOT INCAPACITATING  ( X )  ASA 4   SEVERE SYSTEMIC  DISEASE, DISEASE IS CONSTANT THREAT TO  LIFE  (  )  ASA 5   MORIBUND CONDITION, NOT EXPECTED TO LIVE >24 HOUR IRRESPECTIVE OF PROCEDURE  (  )  E           EMERGENCY PROCEDURE       PLANNED SEDATION:   (  )  NO SEDATION  ( X ) MODERATE SEDATION (Fentanyl  only)  (  ) DEEP SEDATION WITH ANESTHESIA      CONCLUSION:   PATIENT HAS BEEN REASSESSED IMMEDIATELY PRIOR TO THE PROCEDURE   AND IS AN APPROPRIATE CANDIDATE FOR THE PLANNED SEDATION AND   PROCEDURE.  RISKS, BENEFITS AND ALTERNATIVES TO THE PLANNED   PROCEDURE AND SEDATION HAVE BEEN EXPLAINED TO THE PATIENT   AND HIS SON.    (X )  YES  (  )  EMERGENCY CONSENT         Signed by Norleen JAYSON Confer, MD.

## 2023-10-22 ENCOUNTER — Inpatient Hospital Stay: Payer: Medicare Other

## 2023-10-22 DIAGNOSIS — I4891 Unspecified atrial fibrillation: Secondary | ICD-10-CM

## 2023-10-22 DIAGNOSIS — J90 Pleural effusion, not elsewhere classified: Secondary | ICD-10-CM

## 2023-10-22 LAB — BASIC METABOLIC PANEL
Anion Gap: 13 (ref 5.0–15.0)
BUN: 74 mg/dL — ABNORMAL HIGH (ref 9–28)
CO2: 29 meq/L (ref 17–29)
Calcium: 11 mg/dL — ABNORMAL HIGH (ref 7.9–10.2)
Chloride: 96 meq/L — ABNORMAL LOW (ref 99–111)
Creatinine: 2 mg/dL — ABNORMAL HIGH (ref 0.5–1.5)
GFR: 32.7 mL/min/{1.73_m2} — ABNORMAL LOW (ref >=60.0)
Glucose: 183 mg/dL — ABNORMAL HIGH (ref 70–100)
Potassium: 4.2 meq/L (ref 3.5–5.3)
Sodium: 138 meq/L (ref 135–145)

## 2023-10-22 LAB — ECG 12-LEAD
Q-T Interval: 348 ms
QRS Duration: 82 ms
QTC Calculation (Bezet): 401 ms
R Axis: 16 degrees
T Axis: -83 degrees
Ventricular Rate: 80 {beats}/min

## 2023-10-22 LAB — CBC
Absolute nRBC: 0 10*3/uL (ref <=0.00)
Hematocrit: 36.3 % — ABNORMAL LOW (ref 37.6–49.6)
Hemoglobin: 12 g/dL — ABNORMAL LOW (ref 12.5–17.1)
MCH: 30.5 pg (ref 25.1–33.5)
MCHC: 33.1 g/dL (ref 31.5–35.8)
MCV: 92.1 fL (ref 78.0–96.0)
MPV: 12 fL (ref 8.9–12.5)
Platelet Count: 210 10*3/uL (ref 142–346)
RBC: 3.94 10*6/uL — ABNORMAL LOW (ref 4.20–5.90)
RDW: 16 % — ABNORMAL HIGH (ref 11–15)
WBC: 20.01 10*3/uL — ABNORMAL HIGH (ref 3.10–9.50)
nRBC %: 0 /100{WBCs} (ref <=0.0)

## 2023-10-22 LAB — WHOLE BLOOD GLUCOSE POCT
Whole Blood Glucose POCT: 183 mg/dL — ABNORMAL HIGH (ref 70–100)
Whole Blood Glucose POCT: 270 mg/dL — ABNORMAL HIGH (ref 70–100)
Whole Blood Glucose POCT: 263 mg/dL — ABNORMAL HIGH (ref 70–100)
Whole Blood Glucose POCT: 248 mg/dL — ABNORMAL HIGH (ref 70–100)
Whole Blood Glucose POCT: 238 mg/dL — ABNORMAL HIGH (ref 70–100)

## 2023-10-22 LAB — LAB USE ONLY - ACID FAST BACILLI STAIN: Acid Fast Bacilli Stain: NONE SEEN

## 2023-10-22 LAB — ARTERIAL BLOOD GAS
Arterial Base Excess: 3.2 meq/L — ABNORMAL HIGH (ref <=2.7)
Arterial CO2: 29.3 meq/L (ref 23.0–30.0)
Arterial HCO3: 27.9 meq/L (ref 23.0–28.0)
Arterial O2 Saturation: 99.5 % — ABNORMAL HIGH (ref 94.0–98.0)
Arterial pCO2: 44.2 mm[Hg] (ref 32.0–48.0)
Arterial pH: 7.413 (ref 7.350–7.450)
Arterial pO2: 177 mm[Hg] — ABNORMAL HIGH (ref 83.0–108.0)
FIO2: 100 %
Temperature: 36.3 C

## 2023-10-22 LAB — PT/INR
INR: 2.3 (ref 0.9–1.1)
PT: 25.7 s — ABNORMAL HIGH (ref 10.1–12.9)

## 2023-10-22 LAB — MAGNESIUM: Magnesium: 2 mg/dL (ref 1.6–2.6)

## 2023-10-22 LAB — LAB USE ONLY - FUNGAL STAIN: Fungal Stain: NONE SEEN

## 2023-10-22 MED ORDER — SODIUM CHLORIDE 0.9 % IV SOLN
INTRAVENOUS | Status: AC
Start: 2023-10-22 — End: 2023-10-23

## 2023-10-22 MED ORDER — ALBUMIN HUMAN/BIOSIMILIAR 5% IV SOLN (WRAP)
25.0000 g | Freq: Once | INTRAVENOUS | Status: AC
Start: 2023-10-22 — End: 2023-10-22
  Administered 2023-10-22: 25 g via INTRAVENOUS
  Filled 2023-10-22: qty 500

## 2023-10-22 MED ORDER — LACTATED RINGERS IV BOLUS
500.0000 mL | Freq: Once | INTRAVENOUS | Status: AC
Start: 2023-10-22 — End: 2023-10-22
  Administered 2023-10-22: 500 mL via INTRAVENOUS

## 2023-10-22 MED ORDER — ALPRAZOLAM 0.25 MG PO TABS
0.2500 mg | ORAL_TABLET | Freq: Once | ORAL | Status: AC
Start: 2023-10-22 — End: 2023-10-22
  Administered 2023-10-22: 0.25 mg via ORAL
  Filled 2023-10-22: qty 1

## 2023-10-22 MED ORDER — ISOSORBIDE MONONITRATE ER 30 MG PO TB24
30.0000 mg | ORAL_TABLET | Freq: Every day | ORAL | Status: DC
Start: 2023-10-23 — End: 2023-10-23

## 2023-10-22 NOTE — Plan of Care (Signed)
 Shift Note:   At the start of shift, pt drowsy but opened eyes easily and able to hold a conversation, oriented to self and place.  Around 1130, pt's BP 90/52, pt's extremities cold, pt without any UOP this shift, and pt feeling dizzy, MD notified, no new orders at that time.  At 1325, pt desatted to 74% on 4L NC, pt placed on 6L NC with 15L oxymask and recovered to 89%.  MD notified of increased oxygen requirements, pt's BP 81/50, and pt more drowsy/drifting off to sleep frequently, MD at bedside.  Pt placed on HFNC  40L/100% and recovered to 99%, ABG sent, EKG completed,  500mL LR given, pt's BP 89/54, albumin  given, pt more alert and BP 90/51.  Additional 500mL LR given, BP 94/57, pt reporting abdominal fullness, bladder scan showed and pt still without UOP, MD notified, new order for straight cath and NS gtt received.      Neuro: Frequently drowsy, opens eyes easily to voice, oriented to self and place  Cardio: Afib on tele  Respiratory: HFNC 30L/40%, pt sating in the high 90's   L chest tube to -20 suction, 40mL serosanguinous output this shift  Ambulation: x2 assist for bed mobility  Pain: Denies at this time  Lines/Drips: PIV, dressing and site C/D/I  GI/GU: Continent x2,  LBM 2/13, ext cath in place  Fall Score: High     Comments:  For patient safety precautions floor mats down, side rails up 3/4, bed alarm on, bed in lowest position, non-skid foot wear on, bedside table within reach, white board updated, room free from clutter. Pt instructed to call when needed. Call bell & pt belonging within reach.      Plan:   Chest tube management  Daily CXR  Straight cath  Monitor UOP  Monitor mentation and BP  Wean O2 as tolerated  IVF    Problem: Compromised Sensory Perception  Goal: Sensory Perception Interventions  Outcome: Progressing     Problem: Compromised Moisture  Goal: Moisture level Interventions  Outcome: Progressing     Problem: Compromised Activity/Mobility  Goal: Activity/Mobility  Interventions  Outcome: Progressing     Problem: Compromised Nutrition  Goal: Nutrition Interventions  Outcome: Progressing     Problem: Compromised Friction/Shear  Goal: Friction and Shear Interventions  Outcome: Progressing     Problem: Moderate/High Fall Risk Score >5  Goal: Patient will remain free of falls  Outcome: Progressing     Problem: Hemodynamic Status: Cardiac  Goal: Stable vital signs and fluid balance  Outcome: Progressing     Problem: Pain interferes with ability to perform ADL  Goal: Pain at adequate level as identified by patient  Outcome: Progressing     Problem: Side Effects from Pain Analgesia  Goal: Patient will experience minimal side effects of analgesic therapy  Outcome: Progressing

## 2023-10-22 NOTE — Significant Event (Signed)
 Attending - Critical Care Note:  I have personally reviewed the patient's history and interval events, along with vitals, labs and radiology images; those can be found in detail in EPIC notes.   So far today I have spent 1 hour providing care for this patient, excluding teaching and billable procedures, and not overlapping with any other providers, to treat the following life threatening illness(es): Hypotension      Was called by bedside nurse that patient was hypotensive, lethargic and hypoxic.  Arrived to find the patient to be lethargic compared to this morning.  His blood pressure was in the 80s.  Requested a blood gas and fingerstick glucose.  Patient was being transitioned to nasal cannula with reservoir.  Respiratory therapy was at bedside.  On examination his lungs are clear to auscultation bilaterally.  He is lethargic but arouses to verbal stimuli.    Vitals:    10/22/23 1439 10/22/23 1449 10/22/23 1458 10/22/23 1545   BP: 92/56 90/51 93/58  93/57   Pulse: 83 84 85    Resp:  16     Temp:       TempSrc:       SpO2: 98% 97% 97%    Weight:       Height:         Plan:  Ordered bolus of LR 500 mL.  Blood pressure remains in the 80s ordered albumin  25 g.    EKG on my review is atrial fibrillation low voltage no ischemic change    Chest x-ray on my review shows improved aeration of the left upper lobe.  No evidence of pneumothorax.  Trachea midline.  Left lower lobe effusion intact.    Updated son and daughter-in-law and nursing staff at bedside.  Will continue to follow.    This may have resulted as a combination of his blood pressure medicines given this morning and diuresis.  Will pause diuresis for now adjust blood pressure medications to be spaced out through the course of the day.    Eleanore Buss MD

## 2023-10-22 NOTE — Progress Notes (Signed)
 WARFARIN MANAGEMENT    Carl [name] is a 83 y.o. male presenting with L pleural effusion c/b severe sepsis.      Indication: stroke prevention secondary to atrial fibrillation   INR goal: 2 - 3     This is a continuation of home warfarin.  Warfarin counseling is deferred at this time as it is a home medication. Pharmacy will counsel if requested.  Warfarin dose PTA: 2.5 mg daily   Outpatient warfarin management team: [facility] clinic     Recent Labs     10/22/23  0417 10/21/23  0339 10/20/23  0454 10/19/23  2012   Hemoglobin 12.0* 11.3* 11.8*  --    Hematocrit 36.3* 33.7* 34.7*  --    Platelet Count 210 238 242  --    INR 2.3 1.8 1.6 2.0     Recent Labs     10/22/23  0812 10/21/23  0339 10/20/23  0454 10/19/23  1850   Creatinine 2.0* 1.4   < > 1.4   BUN 74* 64*   < > 73*   Albumin   --   --   --  2.2*   AST (SGOT)  --   --   --  51*   ALT  --   --   --  35   Bilirubin, Total  --   --   --  0.5    < > = values in this interval not displayed.     Estimated Creatinine Clearance: 27.6 mL/min (A) (based on SCr of 2 mg/dL (H)).    ASSESSMENT   INR is within goal at 2.3. The INR has increased by 0.5 from yesterday's morning draw.  Potential drug-drug interactions include: none identified  Diet: Regular    PLAN   Recommend to continue warfarin 2.5 mg.  The next INR level is ordered to be collected with AM labs.    Adult Inpatient Warfarin Dosing Guideline

## 2023-10-22 NOTE — Progress Notes (Addendum)
 MEDICINE PROGRESS NOTE    Date Time: 10/22/23 2:33 PM  Patient Name: Carl [name]  Attending Physician: Evelia Service, MD    Assessment / Plan:   Acute on chronic hypoxic respiratory failure 4 L nasal cannula.  Likely due to accumulation of new loculated pleural effusion.  Interventional radiology placed a CT-guided drain 2/15.  Continue current drain on lower chest.  There is also progression of his underlying metastatic cancer.  De-escalate antibiotics to Zosyn  awaiting culture data from new drain.  Metastatic head and neck cancer.  Lungs, pericardium and adrenal glands.  Has had poor functional status to obtain chemotherapy.  Will get hematology oncology to visit family to assist with goals of care notified 2/16.  Severe lactic acidemia.  Resolved with fluid resuscitation on admission  Chronic CHF with preserved ejection fraction.  Resume Bumex  at daily dosing to maintain euvolemia.  Held 2/16 due to concern for volume depletion.  Changed Imdur  to evening dosing due to episode of hypotension 2/16  Paroxysmal atrial fibrillation.  Diltiazem  Toprol  warfarin on hold for procedure now resumed with therapeutic INR at 1.8 -->2.3.  Diabetes mellitus continue sliding scale insulin   Goals of care appreciate palliative care to assist.  COPD with acute exacerbation continue home Symbicort  DuoNeb.  Goals of care discussed with son and daughter in law outside the room.  Options are to continue aggressive care in the hopes that he will regain functional status for chemotherapy.  Alternately can engage with palliative care.  Hospice would be another option considering he has had a progressive decline over the last 6-week which will take a prolonged duration for recovery of functional status if at all possible.  Acute kidney injury likely represents ATN from hypotension.  Will follow.  Avoid nephrotoxins.  Follow electrolytes          Case discussed with: Patient son daughter-in-law.  They are aware of his poor  prognosis.  Pulmonary    Additional Diagnoses:                Safety Checklist:     DVT prophylaxis:  CHEST guideline (See page [id number]) Chemical   Foley:  [place] Rn Foley protocol Not present   IVs:  Peripheral IV   PT/OT: Ordered   Daily CBC & or Chem ordered:  SHM/ABIM guidelines (see #5) Yes, due to clinical and lab instability   Reference for approximate charges of common labs: CBC auto diff - $76  BMP - $99  Mg - $79    Lines:     Patient Lines/Drains/Airways Status       Active PICC Line / CVC Line / PIV Line / Drain / Airway / Intraosseous Line / Epidural Line / ART Line / Line / Wound / Pressure Ulcer / NG/OG Tube       Name Placement date Placement time Site Days    Peripheral IV 10/19/23 20 G Right Antecubital 10/19/23  --  Antecubital  1                     Disposition: (Please see PAF column for Expected D/C Date)   Today's date: 10/22/2023  Admit Date: 10/20/2023  1:49 AM  LOS: 2  Clinical Milestones: Treatment of loculated pleural effusion  Anticipated discharge needs: To be determined      Subjective     CC: Emphysema lung    Interval History/24 hour events: Had CT-guided drain in the left upper chest    HPI/Subjective: Carl [name]  [name]  Reports that he feels a little bit better with respect to dyspnea when treated with pain medications.  Continues to have significant foot and ankle edema.  Does not feel lightheaded at this time.  Son is at the bedside and provided most of the history.  No recent fevers or chills.  Feels much better    Review of Systems:     As per HPI    Physical Exam:     VITAL SIGNS PHYSICAL EXAM   Temp:  [96.4 F (35.8 C)-97.7 F (36.5 C)] 97.3 F (36.3 C)  Heart Rate:  [84-106] 84  Resp Rate:  [15-18] 18  BP: (80-110)/(43-73) 89/54  FiO2 (%):  [60 %-100 %] 60 %  Blood Glucose:    Telemetry: Atrial fibrillation      Intake/Output Summary (Last 24 hours) at 10/22/2023 1433  Last data filed at 10/22/2023 1148  Gross per 24 hour   Intake 1023 ml   Output 40 ml   Net 983 ml     Physical Exam  General:  alert oriented X 2  Cardiovascular: regular rate and rhythm, no murmurs, rubs or gallops  Lungs: Diminished diffusely with scattered expiratory wheezing  Abdomen: soft, non-tender, non-distended; no palpable masses,  normoactive bowel sounds  Extremities: 4+ pitting edema bilateral lower extremities  Frail chronically ill-appearing         Meds:     Medications were reviewed:  Current Facility-Administered Medications   Medication Dose Route Frequency    albumin  human  25 g Intravenous Once    albuterol -ipratropium  3 mL Nebulization Q6H SCH    allopurinol   300 mg Oral Daily    atorvastatin   40 mg Oral Daily    budesonide -formoterol   2 puff Inhalation BID    dilTIAZem   240 mg Oral Daily    dronabinol   2.5 mg Oral BID    insulin  lispro  1-3 Units Subcutaneous QHS    insulin  lispro  1-5 Units Subcutaneous TID AC    [START ON 10-27-2023] isosorbide  mononitrate  30 mg Oral Daily    lactated ringers   500 mL Intravenous Once    metoprolol  succinate  50 mg Oral Daily    ondansetron   4 mg Oral TID AC    piperacillin -tazobactam  4.5 g Intravenous Q8H    tamsulosin   0.4 mg Oral Daily after dinner    warfarin  2.5 mg Oral Daily at 1800    warfarin   Oral See Admin Instructions     Infusion Meds[1]  PRN Medications[2]      Labs:     Labs (last 72 hours):    Recent Labs   Lab 10/22/23  0417 10/21/23  0339   WBC 20.01* 22.31*   Hemoglobin 12.0* 11.3*   Hematocrit 36.3* 33.7*   Platelet Count 210 238       Recent Labs   Lab 10/22/23  0417 10/21/23  0339   PT 25.7* 20.7*   INR 2.3 1.8    Recent Labs   Lab 10/22/23  0812 10/21/23  0339 10/20/23  0454 10/19/23  1850   Sodium 138 138  More results in Results Review 134*   Potassium 4.2 2.9*  More results in Results Review 4.1   Chloride 96* 97*  More results in Results Review 86*   CO2 29 29  More results in Results Review 29   BUN 74* 64*  More results in Results Review 73*   Creatinine 2.0* 1.4  More results in Results  Review 1.4   Calcium  11.0* 10.4*  More  results in Results Review 11.1*   Albumin   --   --   --  2.2*   Protein, Total  --   --   --  5.9*   Bilirubin, Total  --   --   --  0.5   Alkaline Phosphatase  --   --   --  175*   ALT  --   --   --  35   AST (SGOT)  --   --   --  51*   Glucose 183* 177*  More results in Results Review 179*   More results in Results Review = values in this interval not displayed.                   Microbiology, reviewed and are significant for:  Microbiology Results (last 15 days)       Procedure Component Value Units Date/Time    Culture And Gram Stain, Aerobic Bacteria, Wound/Tissue/Fluid [[id number]]     Order Status: Canceled Specimen: Pleural Fluid from Pleural Cavity, Left     Culture and Smear, Acid Fast Bacilli (AFB/Mycobacteria)(Order) [[id number]] Collected: 10/21/23 1010    Order Status: Sent Specimen: Pleural Fluid from Pleural Cavity, Left Updated: 10/22/23 9085    Narrative:      The following orders were created for panel order Culture and Smear, Acid Fast Bacilli (AFB/Mycobacteria)(Order).  Procedure                               Abnormality         Status                     ---------                               -----------         ------                     Acid Fast Bacilli Stain.SABRA.[[id number]]  Normal              Final result               Culture, Acid Fast Baci.SABRA.[[id number]]                      In process                   Please view results for these tests on the individual orders.    Culture and Smear, Fungal (Order) [[id number]] Collected: 10/21/23 1010    Order Status: Sent Specimen: Pleural Fluid from Pleural Cavity, Left Updated: 10/22/23 0957    Narrative:      The following orders were created for panel order Culture and Smear, Fungal (Order).  Procedure                               Abnormality         Status                     ---------                               -----------         ------  Fungal Stain ([id number]]                        Final result                Culture, Fungal (Compon.SABRA.[[id number]]                      In process                   Please view results for these tests on the individual orders.    Culture, Acid Fast Bacilli (AFB/Mycobacteria)(Component) [[id number]] Collected: 10/21/23 1010    Order Status: Sent Specimen: Pleural Fluid from Pleural Cavity, Left Updated: 10/21/23 1049    Culture, Fungal (Component) [[id number]] Collected: 10/21/23 1010    Order Status: Sent Specimen: Pleural Fluid from Pleural Cavity, Left Updated: 10/21/23 1049    Fungal Stain (Component) [[id number]] Collected: 10/21/23 1010    Order Status: Completed Specimen: Pleural Fluid from Pleural Cavity, Left Updated: 10/22/23 0957     Fungal Stain No fungal or yeast elements seen    Acid Fast Bacilli Stain (Component) [[id number]]  (Normal) Collected: 10/21/23 1010    Order Status: Completed Specimen: Pleural Fluid from Pleural Cavity, Left Updated: 10/22/23 0914     Acid Fast Bacilli Stain No acid fast bacilli seen    Culture And Gram Stain, Aerobic Bacteria, Wound/Tissue/Fluid [[id number]] Collected: 10/21/23 1006    Order Status: Completed Specimen: Pleural Fluid from Pleural Cavity, Left Updated: 10/21/23 1648     Gram Stain Many WBCs      No organisms seen     Comment: Stain performed on Cytospin (concentrated) specimen        Culture + Gram Stain,Aerobic, Body Fluid [[id number]]     Order Status: Sent Specimen: Body Fluid from Pleural Fluid     Nares, MRSA (methicillin-resistant Staphylococcus aureus) Screening, PCR [[id number]]  (Normal) Collected: 10/20/23 0505    Order Status: Completed Specimen: Swab from Nares Updated: 10/20/23 0644     MRSA (methicillin resistant Staphylococcus aureus) DNA Not Detected     Comment: Testing performed with the Xpert MRSA NxG assay. This test is intended for the detection of methicillin-resistant Staphylococcus aureus (MRSA) DNA. This test is not intended to diagnose, guide, or monitor treatment for MRSA infections, or provide results  of susceptibility to methicillin. A positive test result does not necessarily indicate the presence of viable organisms. A negative result does not preclude MRSA nasal colonization.       Culture, Blood, Aerobic And Anaerobic [[id number]] Collected: 10/19/23 1923    Order Status: Completed Specimen: Blood, Venous Updated: 10/22/23 0100     Culture Blood No growth at 2 days    COVID-19 and Influenza ([place]) (symptomatic) [[id number]]  (Abnormal) Collected: 10/09/23 1228    Order Status: Completed Specimen: Swab from Anterior Nares Updated: 10/09/23 1327     SARS-CoV-2 (COVID-19) RNA Not Detected     Influenza A RNA Detected     Influenza B RNA Not Detected    Narrative:      A result of Detected indicates POSITIVE for the presence of viral RNA  A result of Not Detected indicates NEGATIVE for the presence of viral RNA    Test performed using the Roche cobas [place] SARS-CoV-2 & Influenza A/B assay. This is a multiplex real-time RT-PCR assay for the detection of SARS-CoV-2, influenza A, and influenza B virus RNA. Viral nucleic acids may persist in vivo, independent of viability. Detection  of viral nucleic acid does not imply the presence of infectious virus, or that virus nucleic acid is the cause of clinical symptoms. Negative results do not preclude SARS-CoV-2, influenza A, and/or influenza B infection and should not be used as the sole basis for diagnosis, treatment or other patient management decisions. Invalid results may be due to inhibiting substances in the specimen and recollection should occur.             Imaging, reviewed and are significant for:  XR Chest AP Portable    Result Date: 10/22/2023  Diminished to resolved loculated left upper pleural effusion. No significant change left lower lung opacity with pleural effusion. Runell Rule, MD 10/22/2023 11:30 AM         Signed by: Eleanore Buss, MD               [1]   Current Facility-Administered Medications   Medication Dose Route Frequency Last Rate   [2]    Current Facility-Administered Medications   Medication Dose Route    acetaminophen   650 mg Oral    albuterol   2.5 mg Nebulization    benzocaine -menthol   1 lozenge Buccal    benzonatate   100 mg Oral    carboxymethylcellulose sodium  1 drop Both Eyes    dextrose   15 g of glucose Oral    Or    dextrose   12.5 g Intravenous    Or    dextrose   12.5 g Intravenous    Or    glucagon  (rDNA)  1 mg Intramuscular    magnesium  sulfate  1 g Intravenous    melatonin  3 mg Oral    morphine   2.5 mg Oral    naloxone   0.2 mg Intravenous    potassium & sodium phosphates   2 packet Oral    potassium chloride   0-60 mEq Oral    Or    potassium chloride   0-60 mEq Oral    Or    potassium chloride   10 mEq Intravenous    saline  2 spray Each Nare

## 2023-10-22 NOTE — Plan of Care (Signed)
 Interventional Pulmonology Note    L apical CT-guided chest tube placed yesterday with 780 mL drained since.     Initial pleural studies do not appear infectious with a monocyte predominant exudate, negative preliminary cultures.   AM CXR with drained left apical loculated fluid collection.     Plan:  - Continue L apical chest tube to suction  - Daily AM CXR while chest tube in place  - Follow-up pleural fluid cultures  - L pleurx drainage via bottle tomorrow    D. Franky Co, DO  Interventional Pulmonology  Critical Care Medicine  10/22/2023 9:17 AM

## 2023-10-23 ENCOUNTER — Inpatient Hospital Stay: Payer: Medicare Other

## 2023-10-23 ENCOUNTER — Encounter: Payer: Self-pay | Admitting: Medical Oncology

## 2023-10-23 ENCOUNTER — Other Ambulatory Visit (INDEPENDENT_AMBULATORY_CARE_PROVIDER_SITE_OTHER): Payer: Self-pay

## 2023-10-23 DIAGNOSIS — C109 Malignant neoplasm of oropharynx, unspecified: Secondary | ICD-10-CM

## 2023-10-23 DIAGNOSIS — R4589 Other symptoms and signs involving emotional state: Secondary | ICD-10-CM

## 2023-10-23 LAB — VENOUS BLOOD GAS
Temperature: 36.5 C
Venous Base Excess: 0.4 meq/L (ref <=5.3)
Venous HCO3: 25.9 meq/L (ref 23.8–32.4)
Venous O2 Saturation: 99.5 % — ABNORMAL HIGH (ref 18.6–94.8)
Venous PCO2: 47.6 mm[Hg] (ref 39.0–63.0)
Venous PO2: 216 mm[Hg] — ABNORMAL HIGH (ref 17.0–59.0)
Venous Total CO2: 27.4 meq/L (ref 25.8–34.1)
Venous pH: 7.353 (ref 7.310–7.410)

## 2023-10-23 LAB — BASIC METABOLIC PANEL
Anion Gap: 11 (ref 5.0–15.0)
BUN: 83 mg/dL — ABNORMAL HIGH (ref 9–28)
CO2: 25 meq/L (ref 17–29)
Calcium: 10.6 mg/dL — ABNORMAL HIGH (ref 7.9–10.2)
Chloride: 98 meq/L — ABNORMAL LOW (ref 99–111)
Creatinine: 2.6 mg/dL — ABNORMAL HIGH (ref 0.5–1.5)
GFR: 23.9 mL/min/{1.73_m2} — ABNORMAL LOW (ref >=60.0)
Glucose: 230 mg/dL — ABNORMAL HIGH (ref 70–100)
Potassium: 4.6 meq/L (ref 3.5–5.3)
Sodium: 134 meq/L — ABNORMAL LOW (ref 135–145)

## 2023-10-23 LAB — PT/INR
INR: 3.1 (ref 0.9–1.1)
PT: 34.7 s — ABNORMAL HIGH (ref 10.1–12.9)

## 2023-10-23 LAB — CBC
Absolute nRBC: 0 10*3/uL (ref <=0.00)
Hematocrit: 34.4 % — ABNORMAL LOW (ref 37.6–49.6)
Hemoglobin: 11.3 g/dL — ABNORMAL LOW (ref 12.5–17.1)
MCH: 30.6 pg (ref 25.1–33.5)
MCHC: 32.8 g/dL (ref 31.5–35.8)
MCV: 93.2 fL (ref 78.0–96.0)
MPV: 12.4 fL (ref 8.9–12.5)
Platelet Count: 174 10*3/uL (ref 142–346)
RBC: 3.69 10*6/uL — ABNORMAL LOW (ref 4.20–5.90)
RDW: 17 % — ABNORMAL HIGH (ref 11–15)
WBC: 24.09 10*3/uL — ABNORMAL HIGH (ref 3.10–9.50)
nRBC %: 0 /100{WBCs} (ref <=0.0)

## 2023-10-23 LAB — WHOLE BLOOD GLUCOSE POCT
Whole Blood Glucose POCT: 231 mg/dL — ABNORMAL HIGH (ref 70–100)
Whole Blood Glucose POCT: 225 mg/dL — ABNORMAL HIGH (ref 70–100)

## 2023-10-23 MED ORDER — HYDROMORPHONE HCL-NACL 50-0.9 MG/250ML-% IV SOLN (CNR/OUTSOURCED)
0.5000 mg/h | INTRAVENOUS | Status: DC
Start: 2023-10-23 — End: 2023-10-24
  Administered 2023-10-23: 0.5 mg/h via INTRAVENOUS
  Filled 2023-10-23: qty 250

## 2023-10-23 MED ORDER — LORAZEPAM 1 MG PO TABS
1.0000 mg | ORAL_TABLET | ORAL | Status: DC | PRN
Start: 2023-10-23 — End: 2023-10-24

## 2023-10-23 MED ORDER — GLYCOPYRROLATE 0.2 MG/ML IJ SOLN (WRAP)
0.4000 mg | Freq: Three times a day (TID) | INTRAMUSCULAR | Status: DC | PRN
Start: 2023-10-23 — End: 2023-10-24
  Administered 2023-10-23: 0.4 mg via INTRAVENOUS
  Filled 2023-10-23: qty 2

## 2023-10-23 MED ORDER — ALBUTEROL-IPRATROPIUM 2.5-0.5 (3) MG/3ML IN SOLN
3.0000 mL | Freq: Four times a day (QID) | RESPIRATORY_TRACT | Status: DC | PRN
Start: 2023-10-23 — End: 2023-10-24

## 2023-10-23 MED ORDER — HALOPERIDOL LACTATE 5 MG/ML IJ SOLN
1.0000 mg | INTRAMUSCULAR | Status: DC | PRN
Start: 2023-10-23 — End: 2023-10-24

## 2023-10-23 MED ORDER — HYDROMORPHONE HCL 1 MG/ML IJ SOLN
0.5000 mg | INTRAMUSCULAR | Status: DC | PRN
Start: 2023-10-23 — End: 2023-10-24
  Administered 2023-10-23: 0.5 mg via INTRAVENOUS
  Filled 2023-10-23: qty 1

## 2023-10-23 MED ORDER — SENNOSIDES-DOCUSATE SODIUM 8.6-50 MG PO TABS
1.0000 | ORAL_TABLET | Freq: Every evening | ORAL | Status: DC
Start: 2023-10-23 — End: 2023-10-23

## 2023-10-23 MED ORDER — ACETAMINOPHEN 650 MG RE SUPP
650.0000 mg | RECTAL | Status: DC | PRN
Start: 2023-10-23 — End: 2023-10-24

## 2023-10-23 MED ORDER — BISACODYL 10 MG RE SUPP
10.0000 mg | Freq: Once | RECTAL | Status: AC
Start: 2023-10-23 — End: 2023-10-23
  Administered 2023-10-23: 10 mg via RECTAL
  Filled 2023-10-23: qty 1

## 2023-10-23 MED ORDER — METOPROLOL SUCCINATE ER 50 MG PO TB24
50.0000 mg | ORAL_TABLET | Freq: Every day | ORAL | Status: DC
Start: 2023-10-23 — End: 2023-10-23
  Administered 2023-10-23: 50 mg via ORAL
  Filled 2023-10-23: qty 1

## 2023-10-23 MED ORDER — BISACODYL 10 MG RE SUPP
10.0000 mg | Freq: Every day | RECTAL | Status: DC | PRN
Start: 2023-10-23 — End: 2023-10-24

## 2023-10-23 MED ORDER — LORAZEPAM 2 MG/ML IJ SOLN
0.5000 mg | INTRAMUSCULAR | Status: DC | PRN
Start: 2023-10-23 — End: 2023-10-24

## 2023-10-23 MED ORDER — ACETAMINOPHEN 325 MG PO TABS
650.0000 mg | ORAL_TABLET | ORAL | Status: DC | PRN
Start: 2023-10-23 — End: 2023-10-24

## 2023-10-24 LAB — CULTURE AND GRAM STAIN, AEROBIC BACTERIA, WOUND/TISSUE/FLUID
Culture Aerobic Bacteria: NO GROWTH
Gram Stain: NONE SEEN

## 2023-10-24 NOTE — Provider Clarification Note (Signed)
 Patient Name: Min, Collymore  Account #: [id number]   MR #: [id number]  Discharge Date:        Dear Dr. Evelia,     'Severe Sepsis 2/2 loculated effusion versus pneumonia versus empyema' is documented in the H&P, but is not noted in subsequent documentation.  Clarification is requested.    History/Risk Factors: 83 y.o. male admitted with Severe Sepsis, AHRF, AKI, and worsening pleural effusion (Med H&P)    PMH:  type 2 diabetes, hypertension, hyperlipidemia, paroxysmal atrial fibrillation, COPD, HFpEF, OSA, and oropharyngeal carcinoma with metastasis (Med H&P)      Clinical Indicators:    Medicine H&P:  'In [facility] ED, he was found to be septic with lactic acid of 6.5 and leukocytosis.  He was given total of 3424 mL of IV fluid and started on Zosyn  and vancomycin  patient has a CT chest done where he was found to have small to moderate loculated left  pleural effusion, increased with tunneled pleural catheter in place, extensive left-sided pleural tumor deposition, bilateral pulmonary nodules increase in size'    'Severe sepsis 2/2 loculated effusion versus pneumonia versus empyema'  Found to have lactic of 6.5 with WBC of 20K at [facility]  CT chest with increased localized pleural effusion  Continue piperacillin  and vancomycin     Thoracic Surgery Plan of Care Note 2/16:  'Initial pleural studies do not appear infectious with a monocyte predominant exudate, negative preliminary cultures.   AM CXR with drained left apical loculated fluid collection.'    Medicine PN 2023-10-24:  'There is also progression of his underlying metastatic cancer which is the likely underlying cause for decompensation.  De-escalate antibiotics to Zosyn  awaiting culture data from new drain though it is unlikely empyema causing above' ( Acute on chronic hypoxic respiratory failure)    Culture Aerobic Bacteria   No growth to date.  Final report to follow.  Gram Stain   Many WBCs  No organisms seen    CXR 2/15  IMPRESSION:    1.Minimal  increase in left basilar opacity suggestive of increase in left  sided pleural effusion with associated atelectasis and/or pneumonia.  2.Left upper lobe fluid collection is unchanged.  3.The remainder is as above.    Treatment:   IV Vanc/Zosyn   CT guided drainage of L pleural effusion   Oncology Consult     Question: Please clarify if the Severe Sepsis 2/2 loculated effusion versus pneumonia versus empyema is:    Severe Sepsis, confirmed, continued to be monitored/evaluated/treated  Severe Sepsis ruled out  Other condition (please specify)     Thank you,    Jeannette Barcelona, BSN, RN, CCDS  Clinical Documentation Integrity Specialist 2   (Cell) [phone]      PROVIDER RESPONSE (Choose from list above or add free text): severe sepsis ruled as infection ruled out in pleural fluid

## 2023-10-25 ENCOUNTER — Ambulatory Visit: Payer: Medicare Other | Admitting: Pulmonary Disease

## 2023-10-25 ENCOUNTER — Other Ambulatory Visit (INDEPENDENT_AMBULATORY_CARE_PROVIDER_SITE_OTHER): Payer: Medicare Other

## 2023-10-25 ENCOUNTER — Ambulatory Visit: Payer: Medicare Other

## 2023-10-25 LAB — CULTURE BLOOD AEROBIC AND ANAEROBIC: Culture Blood: NO GROWTH

## 2023-10-26 ENCOUNTER — Telehealth (INDEPENDENT_AMBULATORY_CARE_PROVIDER_SITE_OTHER): Payer: Medicare Other | Admitting: Internal Medicine

## 2023-11-01 ENCOUNTER — Ambulatory Visit: Payer: Medicare Other

## 2023-11-01 ENCOUNTER — Ambulatory Visit (INDEPENDENT_AMBULATORY_CARE_PROVIDER_SITE_OTHER): Payer: Medicare Other | Admitting: Internal Medicine

## 2023-11-01 ENCOUNTER — Other Ambulatory Visit (INDEPENDENT_AMBULATORY_CARE_PROVIDER_SITE_OTHER): Payer: Medicare Other

## 2023-11-02 ENCOUNTER — Ambulatory Visit: Payer: Medicare Other | Admitting: Pulmonary Disease

## 2023-11-04 NOTE — Progress Notes (Signed)
 Visited patient and family at bedside this evening to provide spiritual care regarding end-of-life issues. Anointed patient with oil as well as provided spiritual encouragement and prayer. Made a positive connection with family, and family expressed appreciation of visit.        Ronnald and peace,    Eloyse Causey  Chaplain PRN  Pager # [phone]

## 2023-11-04 NOTE — Progress Notes (Signed)
 [facility] Palliative Medicine & Comprehensive Care  [name] Phone Number: FX: [phone], Mon-Fri 8a-4:30p / Xtend Pager: #[phone]      Palliative Care Progress Note     Date Time: 11/10/23 11:42 AM   Patient Name: Carl [name], Carl [name]   Location: [id number]/QP595-98   Attending Physician: Carl Service, MD   Primary Care Physician: Carl Ip, MD   Consulting Provider: Alfonso JAYSON Kill, MD   Consulting [name]: Palliative Medicine and Comprehensive Care  Consult request from Carl Service, MD to see patient regarding:   Reason for Referral: Symptom (dyspnea, nausea, anxiety, fatigue, etc.)     All copy pasted portions were reviewed and updated if warranted.     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 He was admitted from 02/03-02/11/25 for Flu A and CHF. Important to note he also has history of COPD, Afib. Seen by Palliative Care Team (Dr. Geni [name]) last admission where a GOC meeting was held and a POST form was filled out.    Palliative Medicine consulted for assistance with Reason for Referral: Symptom (dyspnea, nausea, anxiety, fatigue, etc.)     Secure Epic chat message sent to primary attending notifying them of my visit and recommendations.     Recommendations   Plan     #Metastatic Oropharyngeal Ca  -Follows with Dr. Jeffie  -Was scheduled to start treatment 02/13  -Med Onc not yet consulted this admission. Secure message sent to primary attending recommending Med Onc consult.      1. Goals of Care:   Patient currently NO CPR - SUPPORT OK (confirmed by Palliative Care)  No POA form scanned into Epic  POST form was completed last admission on 10/13/23, please click here to review:Advance Care Planning Navigator   I was able to have an extensive conversation with patient's son, Carl [name], and his DIL, Carl [name], regarding [name]'s current clinical state and their expectations. Please  see separate ACP note for details. In summary, both are concerned that patient will not recover enough to make it to more treatment and wonder if next step in plan of care should be hospice.   10-Nov-2023: please see separate ACP note documenting FM held today. In summary, family would like to proceed with NH placement with hospice overlay.      ACP Validation: Advance care planning discussion initialized  Surrogate Decision Maker: Next of Kin status: son: Carl [name]  ACP Document: DDNR/POST     2. Pain: Patient voiced having pain all over and reports morphine  is working well to alleviate his discomfort.  PMP reviewed and there are no prescribing concerns. Has a script from last admission for oral morphine  concentrate.   OME: 0 mg.  -Recommend continuing with morphine  oral solution 2.5 mg q 2 hours PRN  -Recommend adding morphine  IVP 1 mg q 2 hous PRN in the event PO is only partially effective or if patient needs rapid relief  -Continue tylenol  650 mg q 4 hours PRN for mild pain     These medication requires intensive monitoring for drug toxicity and will be monitored for ADE e.g. over somnolence, constipation, respiratory depression.      - OMEs calculated using updated opioid conversion table (McPherson ML. Demystifying Opioid Conversion Calculations, A Guide for Effective Dosing, 2nd Edition. ASHP; 2018.)     3. Non-Pain Symptoms:   Dyspnea:   -Opioids as above for dyspnea  -Nebulizations,  symbicort  per primary team  -Management of volume state/HF with diuretics per primary team     Slow Transit Constipation:   Recommend senna 1 tab nightly PRN  Miralax daily PRN     Severe protein calorie malnutrition: Alb of  2.2 with Wt of 79.6 kg and BMI of 29.20  -Patient has lost ~40 lbs since November  -Poor PO intake  -recommend Ensure supplements with every meal (to be taken into account if fluid restricted)  -Patient started on marinol  2.5 mg BID     Nausea/Vomiting:  -Recommend Compazine 5mg  IV q4h PRN  Vomiting  -Zofran  is constipating, recommend against scheduling this     Mood/Agitation:   Start PO ativan  1 mg q 4 hours PRN for anxiety     Delirium: Not present. At risk for hospital acquired delirium.      Reviewed Social Determinants of Health: Yes / No : Finding of concern: [ ]  None  [ ]  Financial  [ ]  Education [ ]  Healthcare [ ]  Environment [ x] Social/community     4. Psychosocial: Patient was living alone. Son has been taking care of him for the last 6 weeks. Son, Carl [name], is an only child. Patient is widowed. Other source of support is his DIL, Carl [name].      5. Spiritual: [employer].     6. PC Team follow-up plans: in the next few days      Discharge Disposition: TBD      Outpatient Follow Up Recommended: No    Outcomes: Improved pain intervention, Improved non-pain symptom therapy, Clarified goals of care, Counseled regarding hospice, Provided psychosocial or spiritual support, and Linked to palliative longitudinal support    Carl JAYSON Kill, MD  Palliative Medicine & Comprehensive Care  FX: [phone], Mon-Fri 8a-4:30p / Xtend Pager: [phone] (24/7)          Subjective    History of Presenting Illness   Followed up with [name] and his family.    Since I last saw him on Saturday, he is now on HFNC and yesterday had an episode of hypotension.    Having intermittent diffuse body pain and is appropriately asking for tylenol  or morphine  as needed.    Anxiety has been very palpable, where he has required xanax  for the last 2 nights.                Objective    Palliative Functional and Symptom Assessment     Palliative Performance Scale: 40% - Mainly in bed, unable to do any work, extensive disease, mainly assistance with self-care, normal or reduced intake, full LOC, drowsy or confusion       Medications   Scheduled Meds  Current Facility-Administered Medications   Medication Dose Route Frequency    albuterol -ipratropium  3 mL Nebulization Q6H SCH    allopurinol   300 mg Oral Daily    atorvastatin   40 mg Oral Daily     budesonide -formoterol   2 puff Inhalation BID    dilTIAZem   240 mg Oral Daily    dronabinol   2.5 mg Oral BID    insulin  lispro  1-3 Units Subcutaneous QHS    insulin  lispro  1-5 Units Subcutaneous TID AC    isosorbide  mononitrate  30 mg Oral Daily    metoprolol  succinate  50 mg Oral Daily    ondansetron   4 mg Oral TID AC    piperacillin -tazobactam  4.5 g Intravenous Q8H    senna-docusate  1 tablet Oral QHS    tamsulosin   0.4 mg Oral Daily after dinner    warfarin  2.5 mg Oral Daily at 1800    warfarin   Oral See Admin Instructions      DRIPS     PRN MEDS  PRN Medications[1]    Allergies   Allergies[2]    Physical Exam   BP 101/61   Pulse 100   Temp 97.7 F (36.5 C) (Oral)   Resp 21   Ht 1.651 m (5' 5)   Wt 79 kg (174 lb 3.2 oz)   SpO2 94%   BMI 28.99 kg/m    Physical Exam:  General: well developed, chronically ill appearing male laying in bed in NAD  HEENT:  EOMI, sclera anicteric, OP/OC clear without lesions or thrush, MMM. Soft, weak voice  Neck: supple, FROM   CV: irreg irreg, no murmurs, rubs   Lungs:on NC. Left chest tube appreciated as well as pleurX. On HFNC  Abd: soft, NT, ND, +bs, no rebound or guarding  Ext: large pitting le edema bilaterally   Neuro: appears very tired. Following commands  Psych:  calm during my visit     Labs / Radiology   Lab and diagnostics: reviewed in Epic  Recent Labs   Lab October 31, 2023  0540   WBC 24.09*   Hemoglobin 11.3*   Hematocrit 34.4*   Platelet Count 174       Recent Labs   Lab 10-31-23  0540   PT 34.7*   INR 3.1        Recent Labs   Lab 10-31-2023  0950   Sodium 134*   Potassium 4.6   Chloride 98*   CO2 25   BUN 83*   Creatinine 2.6*   GFR 23.9*   Glucose 230*   Calcium  10.6*     Recent Labs   Lab 10/19/23  1850   Bilirubin, Total 0.5   Protein, Total 5.9*   Albumin  2.2*   ALT 35   AST (SGOT) 51*          XR Chest AP Portable    Result Date: 31-Oct-2023  1.Improved left upper lobe loculated effusion post pigtail drainage. 2.Persistent left lower lobe opacity and  underlying small to moderate effusion which is similar to prior radiograph. Pankaj Nepal, MD 2023/10/31 10:06 AM    XR Chest AP Portable    Result Date: 10/31/2023   1.Stable left lower lung opacity with pleural effusion. 2.Stable left upper lobe hazy opacities. Norleen Lamb 2023-10-31 8:56 AM    XR Chest AP Portable    Result Date: 10/22/2023  Diminished to resolved loculated left upper pleural effusion. No significant change left lower lung opacity with pleural effusion. Runell Rule, MD 10/22/2023 11:30 AM    CT Guided Insert Pleural Cath W/Image    Result Date: 10/21/2023   Uncomplicated upper anterior left pleural catheter placement. JINNY Alverta Confer, MD 10/21/2023 12:22 PM    XR Chest AP Portable    Result Date: 10/21/2023  1. Minimal increase in left basilar opacity suggestive of increase in left sided pleural effusion with associated atelectasis and/or pneumonia. 2. Left upper lobe fluid collection is unchanged. 3. The remainder is as above. Lindie Cunning, MD 10/21/2023 7:26 AM    CT Chest with Contrast    Result Date: 10/19/2023   1.Extensive left-sided pleural tumor deposition with small to moderate loculated left pleural effusion, increased with tunneled pleural catheter in place. 2.Bilateral pulmonary nodules, some increased in size and consistent with metastases. 3.Pericardial nodularity, similar to  prior and also likely represents metastatic disease. 4.Left adrenal nodule, similar to prior. Elwood Miyamoto, MD 10/19/2023 9:10 PM    XR Chest  AP Portable    Result Date: 10/19/2023  Significant increase of loculated left pleural effusion, now filling the majority of the left chest. Underlying pleural thickening and nodularity is appreciated on recent CT scan. Remainder as above. Wolm Luis, MD 10/19/2023 7:55 PM            All copy pasted portions were reviewed and updated if warranted.         [1]   Current Facility-Administered Medications   Medication Dose    acetaminophen   650 mg    albuterol   2.5 mg     benzocaine -menthol   1 lozenge    benzonatate   100 mg    carboxymethylcellulose sodium  1 drop    dextrose   15 g of glucose    Or    dextrose   12.5 g    Or    dextrose   12.5 g    Or    glucagon  (rDNA)  1 mg    magnesium  sulfate  1 g    melatonin  3 mg    morphine   2.5 mg    naloxone   0.2 mg    potassium & sodium phosphates   2 packet    potassium chloride   0-60 mEq    Or    potassium chloride   0-60 mEq    Or    potassium chloride   10 mEq    saline  2 spray   [2]   Allergies  Allergen Reactions    Cephalexin Rash

## 2023-11-04 NOTE — Death Summary (Signed)
 DEATH SUMMARY    Date Time: 10/24/2023  10:52 AM  Patient Name: Carl [name]  Attending Physician: No att. 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  Interventional radiology placed a CT-guided drain 2/15 with much improved pleural fluid.  Continue current drain on lower chest.  Discussed with interventional pulmonary who believe this is progression of his underlying metastatic cancer without reversible treatment options.  De-escalate antibiotics to Zosyn  awaiting culture data from new drain though it is unlikely empyema causing above.  Goals of care discussed 2023/11/21 with son and palliative care at the bedside.  With his rapid decline in respiratory status over the last 24 hours.  Will transition to comfort measures.  GIP consult will be obtained.  Hospice info session will be obtained.  No further labs or nonessential medications.  Metastatic head and neck cancer.  Lungs, pericardium and adrenal glands.  Has had poor functional status to obtain chemotherapy.  Appreciate hematology oncology to visit family and recommended comfort measures.  Severe lactic acidemia.  Resolved with fluid resuscitation on admission  Chronic CHF with preserved ejection fraction.  Intermittently treated with Bumex  dosing to maintain euvolemia.  Held 2/16  due to concern for volume depletion.  Changed Imdur  to evening dosing due to episode of hypotension 2/16.  Paroxysmal atrial fibrillation.  Diltiazem  Toprol  warfarin on hold for procedure now resumed with therapeutic INR at 1.8 -->2.3 --> 3.1 which makes pulmonary embolism unlikely.  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  Acute kidney injury likely represents ATN from hypotension.  Will follow.  Avoid nephrotoxins.  Follow electrolytes  Case discussed with: Patient son daughter-in-law.  They are aware of his poor prognosis and agree to comfort measures.  Pulmonary palliative care oncology all in agreement.    Procedures Performed:                                                                                 Left chest ct guided drain    Time of Death:                                                                                    Patient pronounced deceased at 11:16 PM on 2023-11-13.       Likely Cause of Death:                                                                    Acute hypoxic respiratory failure  Malignant Pleural effusion  Head and Neck Cancer with mets to lung, pericardium and adrenal gland    Was Death Expected:                                                                      Death was expected.    Code Status: Prior    Autopsy:   No Autopsy    Treatment Team:                                                                              Treatment Team:   Emmit Selinda HERO, MD  Rosendo Levora MATSU., RT  Colaccino, Mervyn Dye, RN    Signed by: Eleanore Buss, MD    rr:[id number], Hadassah, MD

## 2023-11-04 NOTE — Consults (Signed)
 CM reviewed consult for GIP eval, CM spoke to [facility] [phone]) who confirmed receipt of consult, will follow up with Pt/family.     Almarie Pinal, LCSW  [facility] Management  SW Case Manager II  [address]  [place], [place] [address]  Phone: [phone]

## 2023-11-04 NOTE — Progress Notes (Signed)
 WARFARIN MANAGEMENT    Carl [name] is a 83 y.o. male presenting with L pleural effusion c/b severe sepsis.      Indication: stroke prevention secondary to atrial fibrillation   INR goal: 2 - 3     This is a continuation of home warfarin.  Warfarin counseling is deferred at this time as it is a home medication. Pharmacy will counsel if requested.  Warfarin dose PTA: 2.5 mg daily   Outpatient warfarin management team: [facility]     Recent Labs     10/22/23  0417 10/21/23  0339 10/20/23  0454 10/19/23  2012   Hemoglobin 12.0* 11.3* 11.8*  --    Hematocrit 36.3* 33.7* 34.7*  --    Platelet Count 210 238 242  --    INR 2.3 1.8 1.6 2.0     Recent Labs     10/22/23  0812 10/21/23  0339 10/20/23  0454 10/19/23  1850   Creatinine 2.0* 1.4   < > 1.4   BUN 74* 64*   < > 73*   Albumin   --   --   --  2.2*   AST (SGOT)  --   --   --  51*   ALT  --   --   --  35   Bilirubin, Total  --   --   --  0.5    < > = values in this interval not displayed.     Estimated Creatinine Clearance: 27.6 mL/min (A) (based on SCr of 2 mg/dL (H)).    ASSESSMENT   INR is supratherapeutic at 3.1. The INR has increased by 0.8 from yesterday's morning draw.  Potential drug-drug interactions include: none identified  Diet: Regular    PLAN   Given acute rise in INR and decrease in patient clinical status likely contributing to poor oral intake, recommend to hold warfarin tonight and resume at 1 mg tomorrow.  The next INR level is ordered to be collected with AM labs.    Adult Inpatient Warfarin Dosing Guideline     Thank you for consulting pharmacy to dose warfarin. We will continue to monitor, obtain levels, and adjust dosing as indicated for the duration of the consult or until patient discharge.     _________________________________  Annitta Dell, PharmD, BCCCP  [facility] Critical Care Pharmacist  P: [phone] or available via Epic Chat

## 2023-11-04 NOTE — Progress Notes (Addendum)
 RN went into pt room at 2230, pt not breathing. RN unable to auscultate apical pulse or feel pulses. RN called charge RN into room to verify RN findings. House MD called. MD called official TOD at  2316 11/17.    Family completed Body release form, all questions answered. Post Mortem care completed, death checklist completed. IV removed, L apical Chest Tube removed. Unable to remove Pleurx d/t resistance. Remaninig Dilaudid  wasted per protocol.    Transport picked pt up at [facility]

## 2023-11-04 NOTE — Progress Notes (Addendum)
 Interventional Pulmonology Daily Progress Note  Team Spectra : [id number]  Epic Chat: FX Interventional Pulmonary Service        Hospital Day: Hospital Day: 4      Assessment:   Paco Cislo is a 83 y.o. 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  He was recently admitted with flu and CHF exacerbation.  Now re-admitted with worsening SOB found to have new loculated apical fluid collection sp CT guided chest tube placement on 2/15 with good drainage.     Patient with persistent respiratory failure in setting of metastatic lung ca with worsening symptoms in past 24 hrs despite good drainage of pleural effusions.     Plan:   - Continue apically placed chest tube to suction, will keep in place in setting of persistent respiratory failure  - Continue drain the pleurx 1-2 times a week as needed  - Can consider apical chest tube removal tomorrow pending output and clinical status      Patient seen and D/W Dr. Maree      Signed by: Dayla Redbird, PA-C  Thoracic Surgery/Interventional Pulmonology Physician Assistant  Interventional Pulmonology team spectra  [phone]    _____________________  IP Attending Attestation:    As the attending physician, I certify that I have personally seen and examined the patient with the APP/Fellow. I performed the substantive portion of this visit by personally conducting the Medical DecisionMaking component, in its entirety. I have reviewed and confirmed the review of systems, relevant histories, vitals, laboratory data, radiological imaging and assessment/plan as documented and agree with additional comments below.     83 y.o. male with pmhx of metastatic H&N cancer with cytology proven malignant pleural effusion s/p left sided pleurx placement, HFpEF who is hospitalized with worsening hypoxic respiratory failure. Patients imaging revealed new apical fluid component now s/p IR guided chest tube placement. Patient  draining serosanguinous fluid but continuing with need for HFNC for supplemental O2. Patients imaging suggestive of worsening metastatic pulmonary disease burden and now with worsening encephalopathy. Unclear if any further reversible process from interventional pulmonology standpoint. Given advanced age, poor performance status and metastatic cancer would recommend engaging in goals of care discussions. Can leave the Left apical chest tube to suction at this time and drain the pleurx PRN 2-3 weekly as needed. Low utility in instilling lytics for the basal pocket.       Jinnie Maree, MD  Interventional Pulmonology  [facility]  [facility]  [address]  [place], [place] [address]   T [phone]  F [phone]   [email or web address]      Interval History:   24 hour Events: Worsening respiratory status on HFNC, hypotension, worsening mental status      Physical Exam:   Current Vitals:   BP 103/63   Pulse 90   Temp 97 F (36.1 C) (Axillary)   Resp 21   Ht 1.651 m (5' 5)   Wt 79 kg (174 lb 3.2 oz)   SpO2 95%   BMI 28.99 kg/m       GEN: Drowsy male, NAD  HEENT: Anicteric conjunctiva, pupils reactive  NECK: Supple, good range of motion.    PULM: on 40L/min @ 40%  CV: RRR  SKIN: No diffuse rashes        Lines/Drains:   L pigtail Chest Tube: of serosanguinous drainage in past 24 hours. No Airleak. On -20 cm H2O sucition  L pleurx in place, capped.       Laboratory and Radiological Results:     Lab Results   Component Value Date    WBC 24.09 (H) 01-Nov-2023    HGB 11.3 (L) 11-01-2023    HCT 34.4 (L) 11-01-23    MCV 93.2 11/01/23    PLT 174 11/01/23     Lab Results   Component Value Date    NA 138 10/22/2023    K 4.2 10/22/2023    CL 96 (L) 10/22/2023    CO2 29 10/22/2023    BUN 74 (H) 10/22/2023    CREAT 2.0 (H) 10/22/2023    MG 2.0 10/22/2023         XR Chest AP Portable    Result Date: 11/01/23   1.Stable left lower lung opacity with pleural effusion. 2.Stable  left upper lobe hazy opacities. Norleen Lamb 01-Nov-2023 8:56 AM

## 2023-11-04 NOTE — Progress Notes (Addendum)
 Case Management Progress Note     Date: 2023-11-22    Patient: [facility] Problems    Diagnosis    Emphysema lung    Pleural effusion on left       Length of stay: Hospital Day 3        Discharge plan: Pending hospital course, SNF with hospice overlay vs. GIP.     Anticipated date of discharge: 10/26/23    Barriers to discharge: HFNC 40L/40%, Nebs, IV abx, Q shift/PRN straight cath, CT management, daily CXR, Monitor UOP/mentation, BP. Palliative and thoracics following.     2 PM update- CM notified by palliative care team that family interested in hospice as next steps, goal would be SNF with hospice, able to pay out of pocket for room and board costs. Oxygen requirements are a barrier to SNF placement.     Pt on HFNC, consults placed for hospice informational visit for outpatient and GIP. CM called [facility] [phone]) who states that they need consult to eval for GIP. CM sent information to medical team.     CM continuing to peripherally follow for discharge needs.     Almarie Pinal, LCSW  Case Manager II  [facility] Case Management    [address]  [place], [place] [address]  Phone: [phone]

## 2023-11-04 NOTE — ACP (Advance Care Planning) (Signed)
 Patient Care Conference Note    Patient Name: Carl [name]:      Meeting Date: October 24, 2023         Purpose of Meeting  Discuss Goals of care  Discuss Plan of care  Discuss Disposition    Meeting Participants   [name]'s son  [name]-pt's daughter in law  Dr. Alfonso Banda Care Physician    Medical Decision Maker      Does [name] have medical decision-making capacity?  No, delirious. Waxing and waning mental status.   Patient represented by his son and DIL as legal Next of Kin.  The patient did not participate in the meeting. It was held in the waiting room.    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 He was admitted from 02/03-02/11/25 for Flu A and CHF. Important to note he also has history of COPD, Afib. Found to have new loculated apical fluid collection sp CT guided chest tube placement on 2/15 with 750cc drainage. He has persistent respiratory failure now on HFNC.     I asked patient (in front of his loved ones) if he trusts his family to make medical decisions on his behalf, to which he replied yes. I asked if it would be alright to have a discussion outside his room regarding his clinical state, he also said yes.     This meeting was a continuation from Saturday's visit. I explained to the family that I had spoken to Med On[facility] and they are recommending hospice be the next step in plan of care.    I reviewed everything we had discussed on Saturday in terms of logistics. I added that patient would not be able to go to a NH with the HFNC, he would need to be weaned off of that or would need to go to a hospice IPU temporarily.    Family had thoughtful questions about frequency of hospice visits, limitations, etc.     I went over next step of placing a hospice infor order visit and planning for discharge in the upcoming days.      Patient's or Decision Maker's Perspective, Wishes, and Goals for Treatment  Family is interested in NH with hospice overlay.  Family is interested in speaking with CM to help with NH choice.    Code Status and Medical Interventions Discussed   DNR with supp    Advance Directive Documentation  Existing Documents that were Reviewed/Discussed with Patient/Decision Maker:  POST or Equivalent  Documents Filled Out as Part of Discussion:                None    Medical Decisions and Plan of Care  Hospice info order visit was placed.  Primary team working on weaning off the HFNC.   Secure chat involving primary team, CM, and Med Onc informing them of the content and outcome of family meeting.    This information was shared with: Attending physician and CM and Med Onc.    Start time: 10:45 a.m.  End Time: 11:45 a.m.    I have spent 60 minutes on face-to-face Advance Care Planning Services. 100% of the time was spent on discussion and counseling the son and daughter in law. No active management of the problems listed above was undertaken during the time period reported.       Signed by: Alfonso JAYSON Kill, MD

## 2023-11-04 NOTE — OT Progress Note (Signed)
 [facility]   Occupational Therapy Cancellation Note      Patient:  Carl [name] MRN#:  [id number]  Unit:  [facility] Room/Bed:  FI404/FI404-01    2023-11-08  Time: 11:07 AM       OT Cancellation: Visit  OT Visit Cancellation Reason: Other (comment required) (pt lethargic and unable to participate, limited responsiveness, RN aware.)         Jori Raring, OTD, OTR/L  [phone]

## 2023-11-04 NOTE — Plan of Care (Addendum)
 Shift Note:   - Pt found to be lethargic at beginning of shift, awakens occasionally to voice but requiring frequent attempts  - Multiple desaturation events to mid-low 80s; pt began shift on 40L/40% -> increased to 50L/70% -> 60L/80% -> 60L/100% + 15L oxymask. Pt tachypneic as well. Attending MD was notified aware. Family agreed too do comfort measures; patient's code status changed. Hospice was consulted   - Pt given x1 suppository for constipation w/no effect. Pt not able to tolerate enema at this time  - Bladder scans showing <199 mL; per attending MD pt not appropriate for foley at this time. Will continue to monitor  - Given PRN Tylenol , PRN morphine  this shift for pain/discomfort/dyspena   - Dilaudid  gtt started on pt      Neuro: UTA, pt responding to pain. Lethargic, can be restless/anxious   Cardio: Not on tele   Respiratory: HFNC 60L/100% + 15L oxymask. Pt RR in 20s at this time.   Ambulation: x2 total assist   Pain: Generalized, abdominal - pt unable to rate. PAINAD   Lines/Drips: PIV 20G R FA - C/D/I   Chest Tube L apical 14Fr. - C/D/I, -20 suction. 150 mL output this shift.   GI/GU: Incontinent x2, external cath in place. LBM 2/13  Diet: CC/Heart healthy   Fall Score: High     Comments:  For patient safety precautions floor mats down, side rails up 3/4, bed alarm on, bed in lowest position, non-skid foot wear on, bedside table within reach, white board updated, room free from clutter. Pt instructed to call when needed. Call bell & pt belonging within reach. Will continue rounding per protocol.    Plan:   - Comfort measures only  - Monitor UOP  - Hospice consulted  - Treat dyspnea, pain        Problem: Compromised Sensory Perception  Goal: Sensory Perception Interventions  Outcome: Progressing  Flowsheets (Taken 10/22/2023 1930 by Georgian Fellows, RN)  Sensory Perception Interventions: Offload heels, Pad bony prominences, Reposition q 2hrs/turn Clock, Q2 hour skin assessment under devices if present      Problem: Compromised Moisture  Goal: Moisture level Interventions  Outcome: Progressing  Flowsheets (Taken 10/22/2023 1930 by Georgian Fellows, RN)  Moisture level Interventions: Moisture wicking products, Moisture barrier cream     Problem: Compromised Activity/Mobility  Goal: Activity/Mobility Interventions  Outcome: Progressing  Flowsheets (Taken 10/22/2023 1930 by Georgian Fellows, RN)  Activity/Mobility Interventions: Pad bony prominences, TAP Seated positioning system when OOB, Promote PMP, Reposition q 2 hrs / turn clock, Offload heels     Problem: Compromised Nutrition  Goal: Nutrition Interventions  Outcome: Progressing  Flowsheets (Taken 10/22/2023 1930 by Georgian Fellows, RN)  Nutrition Interventions: Discuss nutrition at Rounds, I&Os, Document % meal eaten, Daily weights     Problem: Compromised Friction/Shear  Goal: Friction and Shear Interventions  Outcome: Progressing  Flowsheets (Taken 10/22/2023 1930 by Georgian Fellows, RN)  Friction and Shear Interventions: Pad bony prominences, Off load heels, HOB 30 degrees or less unless contraindicated, Consider: TAP seated positioning, Heel foams     Problem: Moderate/High Fall Risk Score >5  Goal: Patient will remain free of falls  Outcome: Progressing  Flowsheets (Taken 10/21/2023 2038 by Cena Raisin, RN)  Moderate Risk (6-13):   MOD-Consider activation of bed alarm if appropriate   MOD-Apply bed exit alarm if patient is confused   MOD-Floor mat at bedside (where available) if appropriate   MOD-Consider a move closer to Nurses Station   MOD-Remain with patient  during toileting   MOD-Place bedside commode and assistive devices out of sight when not in use   MOD-Perform dangle, stand, walk (DSW) prior to mobilization   MOD-Re-orient confused patients   MOD-Request PT/OT consult order for patients with gait/mobility impairment   MOD-include family in multidisciplinary POC discussions   LOW-Fall Interventions Appropriate for Low Fall Risk   LOW-Anticoagulation education for injury  risk     Problem: Hemodynamic Status: Cardiac  Goal: Stable vital signs and fluid balance  Outcome: Progressing  Flowsheets (Taken 11-03-23 0101 by Georgian Fellows, RN)  Stable vital signs and fluid balance:   Assess signs and symptoms associated with cardiac rhythm changes   Monitor lab values     Problem: Pain interferes with ability to perform ADL  Goal: Pain at adequate level as identified by patient  Outcome: Progressing  Flowsheets (Taken November 03, 2023 0101 by Georgian Fellows, RN)  Pain at adequate level as identified by patient:   Identify patient comfort function goal   Assess for risk of opioid induced respiratory depression, including snoring/sleep apnea. Alert healthcare team of risk factors identified.   Assess pain on admission, during daily assessment and/or before any as needed intervention(s)   Reassess pain within 30-60 minutes of any procedure/intervention, per Pain Assessment, Intervention, Reassessment (AIR) Cycle   Evaluate patient's satisfaction with pain management progress   Evaluate if patient comfort function goal is met   Offer non-pharmacological pain management interventions   Consult/collaborate with Physical Therapy, Occupational Therapy, and/or Speech Therapy   Include patient/patient care companion in decisions related to pain management as needed     Problem: Side Effects from Pain Analgesia  Goal: Patient will experience minimal side effects of analgesic therapy  Outcome: Progressing  Flowsheets (Taken 2023-11-03 0101 by Georgian Fellows, RN)  Patient will experience minimal side effects of analgesic therapy:   Monitor/assess patient's respiratory status (RR depth, effort, breath sounds)   Assess for changes in cognitive function   Prevent/manage side effects per LIP orders (i.e. nausea, vomiting, pruritus, constipation, urinary retention, etc.)   Evaluate for opioid-induced sedation with appropriate assessment tool (i.e. POSS)     Problem: Safety  Goal: Patient will be free from injury during  hospitalization  Outcome: Progressing  Flowsheets (Taken 2023-11-03 0101 by Georgian Fellows, RN)  Patient will be free from injury during hospitalization:   Assess patient's risk for falls and implement fall prevention plan of care per policy   Provide and maintain safe environment   Use appropriate transfer methods   Ensure appropriate safety devices are available at the bedside   Include patient/ family/ care giver in decisions related to safety   Hourly rounding   Provide alternative method of communication if needed (communication boards, writing)   Assess for patients risk for elopement and implement Elopement Risk Plan per policy  Goal: Patient will be free from infection during hospitalization  Outcome: Progressing  Flowsheets (Taken 11-03-23 0101 by Georgian Fellows, RN)  Free from Infection during hospitalization:   Assess and monitor for signs and symptoms of infection   Monitor lab/diagnostic results   Monitor all insertion sites (i.e. indwelling lines, tubes, urinary catheters, and drains)   Encourage patient and family to use good hand hygiene technique     Problem: Pain  Goal: Pain at adequate level as identified by patient  Outcome: Progressing  Flowsheets (Taken 2023-11-03 0101 by Georgian Fellows, RN)  Pain at adequate level as identified by patient:   Identify patient comfort function goal   Assess  for risk of opioid induced respiratory depression, including snoring/sleep apnea. Alert healthcare team of risk factors identified.   Assess pain on admission, during daily assessment and/or before any as needed intervention(s)   Reassess pain within 30-60 minutes of any procedure/intervention, per Pain Assessment, Intervention, Reassessment (AIR) Cycle   Evaluate patient's satisfaction with pain management progress   Evaluate if patient comfort function goal is met   Offer non-pharmacological pain management interventions   Consult/collaborate with Physical Therapy, Occupational Therapy, and/or Speech Therapy   Include  patient/patient care companion in decisions related to pain management as needed     Problem: Inadequate Gas Exchange  Goal: Adequate oxygenation and improved ventilation  Outcome: Progressing  Flowsheets (Taken 10-24-2023 0101 by Georgian Fellows, RN)  Adequate oxygenation and improved ventilation:   Assess lung sounds   Monitor SpO2 and treat as needed   Provide mechanical and oxygen support to facilitate gas exchange   Teach/reinforce use of incentive spirometer 10 times per hour while awake, cough and deep breath as needed   Plan activities to conserve energy: plan rest periods   Increase activity as tolerated/progressive mobility   Consult/collaborate with Respiratory Therapy  Goal: Patent Airway maintained  Outcome: Progressing  Flowsheets (Taken October 24, 2023 0101 by Georgian Fellows, RN)  Patent airway maintained:   Position patient for maximum ventilatory efficiency   Reposition patient every 2 hours and as needed unless able to self-reposition     Problem: Fluid and Electrolyte Imbalance/ Endocrine  Goal: Fluid and electrolyte balance are achieved/maintained  Outcome: Progressing  Flowsheets (Taken 10/24/2023 0101 by Georgian Fellows, RN)  Fluid and electrolyte balance are achieved/maintained:   Monitor/assess lab values and report abnormal values   Assess and reassess fluid and electrolyte status   Observe for cardiac arrhythmias   Monitor for muscle weakness

## 2023-11-04 NOTE — Consults (Signed)
 [phone] / Available 24 hours      Hospice Informational Visit:    Date Time: 27-Oct-2023 4:38 PM   Patient Name: Carl [name]   Requesting Physician: Evelia Service, MD   Present at Visit: Gene     Visit Outcome:   Consult provided at bedside with Son and DIL present. Explained GIP LOC vs palliative and what wean off HFNC could look like. Son would like to take the night to discuss with family and try to reach a decision.     Hospice Care Guide Provided:     [x]  YES     []  NO   Hospice Consent Signed:     []  YES     [x]  NO       Next Steps:   F/u in the morning with a phone call to family      Signed by: Powell Hammersmith, RN

## 2023-11-04 NOTE — Consults (Signed)
 Inpatient Solid Tumor PA spectra  [id number]  M-F 8am-430pm  After hours, call [phone]    INPATIENT ONCOLOGY CONSULTATION    Date Time: 11/06/23 2:41 PM  Patient Name: Carl [name]  Requesting Physician: Evelia Service, MD  Attending Oncologist:     Reason for Consultation:   Squamous cell carcinoma of the oropharynx    Assessment and plan:   #1 stage IV oropharyngeal SCCa  #2 loculated left-sided malignant pleural effusion  #3 failure to thrive/progressive weakness  #4 acute hypoxic respiratory failure  #5 chronic CHF/atrial fibrillation/COPD    -Clinical evaluation notable for fatigue, decreased responsiveness and mild distress  -Overall patient's performance status is steadily declined over time.  ECOG PS 4  -Review of recent CT chest from February 13 concerning for progression of disease with increase in size of some of the previously noted pulmonary nodules.  In addition there is persistent left-sided pleural effusion.  -Not a candidate for any systemic treatment of his cancer.  In fact any systemic treatment including immunotherapy is more likely to result in harm than benefit.  Reviewed this with patient's son and daughter-in-law.  -Reviewed goals of care and patient's family members would prefer to focus on comfort measures.  -Patient himself is unable to communicate or participate in the discussion.  -Discussed with primary team and palliative care.  Patient will proceed towards comfort measures.  We agree as well and will be available as needed.  History:   Carl [name] is a 83 y.o. male who presents to the hospital on 10/20/2023 with worsening shortness of breath.  Patient had recently been admitted to the hospital and subsequently discharged home with a plan to follow-up in our oncology clinic for possible systemic treatment of his cancer.  But before even he could make it to the clinic appointment he was readmitted with worsening shortness of breath.  During this admission  he had left-sided pleural effusion requiring a CT-guided pleural fluid drainage and was started on empiric IV antibiotics.  But continues to have shortness of breath requiring high flow nasal cannula support.    During our visit today patient resting in bed with his daughter-in-law by his bedside and subsequently joined by his son Gene as well.  Patient himself did not appear to recognize as though he was able to recognize his son and daughter-in-law.  Has increased work of breathing and intermittently in distress but unable to speak or communicate effectively.  Also appeared to be restless throughout the visit.  His son notes that patient appears to be more tired and weaker than usual.    Past Medical History:     Past Medical History:   Diagnosis Date    Diabetes mellitus     Hyperlipidemia     Hypertension        Past Surgical History:   Past Surgical History[1]    Family History:   Family History[2]    Social History:     Social History     Socioeconomic History    Marital status: Widowed     Spouse name: Not on file    Number of children: Not on file    Years of education: Not on file    Highest education level: Not on file   Occupational History    Not on file   Tobacco Use    Smoking status: Former     Types: Cigarettes     Passive exposure: Past    Smokeless tobacco: Not on file  Tobacco comments:     Quit smoking June 1989, smoked 30 years before   Vaping Use    Vaping status: Never Used   Substance and Sexual Activity    Alcohol use: Not Currently    Drug use: Not Currently    Sexual activity: Not Currently   Other Topics Concern    Not on file   Social History Narrative    Not on file     Social Drivers of Health     Financial Resource Strain: Low Risk  (10/10/2023)    Overall Financial Resource Strain (CARDIA)     Difficulty of Paying Living Expenses: Not hard at all   Food Insecurity: No Food Insecurity (10/20/2023)    Hunger Vital Sign     Worried About Running Out of Food in the Last Year: Never true      Ran Out of Food in the Last Year: Never true   Transportation Needs: No Transportation Needs (10/20/2023)    PRAPARE - Therapist, Art (Medical): No     Lack of Transportation (Non-Medical): No   Physical Activity: Inactive (10/08/2023)    Exercise Vital Sign     Days of Exercise per Week: 0 days     Minutes of Exercise per Session: 20 min   Stress: No Stress Concern Present (10/08/2023)    [employer] of Occupational Health - Occupational Stress Questionnaire     Feeling of Stress : Only a little   Social Connections: Moderately Isolated (10/08/2023)    Social Connection and Isolation Panel [NHANES]     Frequency of Communication with Friends and Family: More than three times a week     Frequency of Social Gatherings with Friends and Family: More than three times a week     Attends Religious Services: More than 4 times per year     Active Member of [employer] or Organizations: No     Attends Banker Meetings: Not on file     Marital Status: Widowed   Intimate Partner Violence: Not At Risk (10/20/2023)    Humiliation, Afraid, Rape, and Kick questionnaire     Fear of Current or Ex-Partner: No     Emotionally Abused: No     Physically Abused: No     Sexually Abused: No   Housing Stability: Low Risk  (10/20/2023)    Housing Stability Vital Sign     Unable to Pay for Housing in the Last Year: No     Number of Times Moved in the Last Year: 0     Homeless in the Last Year: No       Allergies:   Allergies[3]    Medications:     Current Facility-Administered Medications   Medication Dose Route Frequency    albuterol -ipratropium  3 mL Nebulization Q6H SCH       Review of Systems:   Review of Systems   Unable to perform ROS: Mental status change        Physical Exam:     Vitals:    11-12-23 1142 12-Nov-2023 1318 Nov 12, 2023 1357 November 12, 2023 1423   BP: 95/64 116/72     Pulse: 100 (!) 107     Resp: 22   (!) 28   Temp: 97.5 F (36.4 C)      TempSrc: Oral      SpO2: 93%  91% 96%   Weight:       Height:  Intake and Output Summary (Last 24 hours) at Date Time    Intake/Output Summary (Last 24 hours) at 11-01-2023 1441  Last data filed at 11/01/23 0800  Gross per 24 hour   Intake --   Output 390 ml   Net -390 ml     Physical Exam  Constitutional:       Appearance: He is ill-appearing.   HENT:      Head: Normocephalic and atraumatic.      Nose: Nose normal. No rhinorrhea.      Mouth/Throat:      Mouth: Mucous membranes are dry.      Pharynx: No posterior oropharyngeal erythema.   Eyes:      General: No scleral icterus.        Right eye: No discharge.         Left eye: No discharge.      Extraocular Movements: Extraocular movements intact.      Conjunctiva/sclera: Conjunctivae normal.   Cardiovascular:      Rate and Rhythm: Tachycardia present.      Heart sounds: Normal heart sounds. No murmur heard.     No gallop.   Pulmonary:      Effort: Respiratory distress present.      Breath sounds: Wheezing and rales present.   Abdominal:      General: Bowel sounds are normal. There is no distension.      Palpations: Abdomen is soft.      Tenderness: There is no abdominal tenderness. There is no guarding.   Musculoskeletal:      Cervical back: No rigidity.      Right lower leg: Edema present.      Left lower leg: Edema present.   Skin:     General: Skin is warm.      Coloration: Skin is not jaundiced.   Neurological:      Mental Status: He is disoriented.   Psychiatric:         Behavior: Behavior normal.        Labs Reviewed:     Results       Procedure Component Value Units Date/Time    Whole Blood Glucose POCT [[id number]]  (Abnormal) Collected: 11-01-23 1139    Specimen: Blood, Capillary Updated: 01-Nov-2023 1143     Whole Blood Glucose POCT 225 mg/dL     Basic Metabolic Panel [[id number]]  (Abnormal) Collected: 11/01/23 0950    Specimen: Blood, Venous Updated: November 01, 2023 1033     Glucose 230 mg/dL      BUN 83 mg/dL      Creatinine 2.6 mg/dL      Calcium  10.6 mg/dL      Sodium 865 mEq/L      Potassium 4.6 mEq/L       Chloride 98 mEq/L      CO2 25 mEq/L      Anion Gap 11.0     GFR 23.9 mL/min/1.73 m2     Venous Blood Gas [[id number]]  (Abnormal) Collected: 11/01/23 0951    Specimen: Blood, Venous Updated: 2023/11/01 1004     Venous pH 7.353     Venous PCO2 47.6 mmHg      Venous PO2 216.0 mmHg      Venous HCO3 25.9 mEq/L      Venous Total CO2 27.4 mEq/L      Venous Base Excess 0.4 mEq/L      Venous O2 Saturation 99.5 %      Temperature 36.5 C      Collection  Site Venous    Whole Blood Glucose POCT [[id number]]  (Abnormal) Collected: 11/08/2023 0746    Specimen: Blood, Capillary Updated: November 08, 2023 0747     Whole Blood Glucose POCT 231 mg/dL     PT/INR [[id number]]  (Abnormal) Collected: 11/08/2023 0540    Specimen: Blood, Venous Updated: 11/08/23 0652     PT 34.7 sec      INR 3.1    CBC without Differential [[id number]]  (Abnormal) Collected: November 08, 2023 0540    Specimen: Blood, Venous Updated: 11-08-23 0649     WBC 24.09 x10 3/uL      Hemoglobin 11.3 g/dL      Hematocrit 65.5 %      Platelet Count 174 x10 3/uL      MPV 12.4 fL      RBC 3.69 x10 6/uL      MCV 93.2 fL      MCH 30.6 pg      MCHC 32.8 g/dL      RDW 17 %      nRBC % 0.0 /100 WBC      Absolute nRBC 0.00 x10 3/uL     Whole Blood Glucose POCT [[id number]]  (Abnormal) Collected: 10/22/23 2139    Specimen: Blood, Capillary Updated: 10/22/23 2149     Whole Blood Glucose POCT 238 mg/dL     Whole Blood Glucose POCT [[id number]]  (Abnormal) Collected: 10/22/23 1630    Specimen: Blood, Capillary Updated: 10/22/23 1633     Whole Blood Glucose POCT 248 mg/dL     Whole Blood Glucose POCT [[id number]]  (Abnormal) Collected: 10/22/23 1357    Specimen: Blood, Capillary Updated: 10/22/23 1556     Whole Blood Glucose POCT 263 mg/dL     Culture And Gram Stain, Aerobic Bacteria, Wound/Tissue/Fluid [[id number]] Collected: 10/21/23 1006    Specimen: Pleural Fluid from Pleural Cavity, Left Updated: 10/22/23 1548     Culture Aerobic Bacteria No growth to date.  Final report to follow.     Gram Stain  Many WBCs      No organisms seen              Rads:   XR Chest AP Portable    Result Date: 11-08-23  1.Improved left upper lobe loculated effusion post pigtail drainage. 2.Persistent left lower lobe opacity and underlying small to moderate effusion which is similar to prior radiograph. Pankaj Nepal, MD 11/08/23 10:06 AM    XR Chest AP Portable    Result Date: 11/08/2023   1.Stable left lower lung opacity with pleural effusion. 2.Stable left upper lobe hazy opacities. Norleen Lamb 08-Nov-2023 8:56 AM    XR Chest AP Portable    Result Date: 10/22/2023  Diminished to resolved loculated left upper pleural effusion. No significant change left lower lung opacity with pleural effusion. Runell Rule, MD 10/22/2023 11:30 AM    CT Guided Insert Pleural Cath W/Image    Result Date: 10/21/2023   Uncomplicated upper anterior left pleural catheter placement. JINNY Alverta Confer, MD 10/21/2023 12:22 PM    XR Chest AP Portable    Result Date: 10/21/2023  1. Minimal increase in left basilar opacity suggestive of increase in left sided pleural effusion with associated atelectasis and/or pneumonia. 2. Left upper lobe fluid collection is unchanged. 3. The remainder is as above. Lindie Cunning, MD 10/21/2023 7:26 AM    CT Chest with Contrast    Result Date: 10/19/2023   1.Extensive left-sided pleural tumor deposition with small to moderate loculated left pleural effusion, increased with tunneled pleural  catheter in place. 2.Bilateral pulmonary nodules, some increased in size and consistent with metastases. 3.Pericardial nodularity, similar to prior and also likely represents metastatic disease. 4.Left adrenal nodule, similar to prior. Elwood Miyamoto, MD 10/19/2023 9:10 PM    XR Chest  AP Portable    Result Date: 10/19/2023  Significant increase of loculated left pleural effusion, now filling the majority of the left chest. Underlying pleural thickening and nodularity is appreciated on recent CT scan. Remainder as above. Wolm Luis, MD 10/19/2023 7:55 PM        Signed by: Jocelyne Robin, MD        [facility]  [address]   [place] [address]  [phone]          [1]   Past Surgical History:  Procedure Laterality Date    CARDIAC SURGERY  2004    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    PLACEMENT, INDWELLING CATHETER PLEURAL Left 09/19/2023    Procedure: PLACEMENT, INDWELLING TUNNELED CATHETER, PLEURAL;  Surgeon: Maree Jinnie RAMAN, MD;  Location: [id number];  Service: Pulmonary;  Laterality: Left;  UNIVERSAL    SKIN BIOPSY      TONSILLECTOMY      ULTRASOUND, ENDOBRONCHIAL (EBUS) N/A 09/19/2023    Procedure: ULTRASOUND, ENDOBRONCHIAL (EBUS);  Surgeon: Maree Jinnie RAMAN, MD;  Location: [facility];  Service: Pulmonary;  Laterality: N/A;   [2]   Family History  Problem Relation Name Age of Onset    Lung cancer Brother     [3]   Allergies  Allergen Reactions    Cephalexin Rash

## 2023-11-04 NOTE — Plan of Care (Signed)
 Brief Palliative Care Note    Patient has actively declined in the last few hours requiring increase in O2 needs.    Dr. Evelia and I met with pt's son/legal NOK (Gene) and discussed transitioning to comfort focused care and Gene agreed.    -Comfort care order set in place.  -Discontinued medications that were not aimed to help with symptom management  -Started dilaudid  infusion at 0.4mg /hour.  -Additional PRN dilaudid  available for nurse administration.  -GIP consult placed.    Palliative Care will continue to follow to titrate medications to ensure comfort at EOL.    Alfonso JAYSON Kill, MD  Palliative Medicine

## 2023-11-04 NOTE — Progress Notes (Signed)
 Nurse Navigator ( NN) Note     Patient was referred by NP [facility] for heart failure out reach. Patient is presently in the hospital for admission  NN will follow after admission       Will follow patient    Ameira Alessandrini BSN, [facility], MPH  [facility]  Ambulatory Care Management Team  Phone # [phone]

## 2023-11-04 NOTE — PT Progress Note (Signed)
 [facility]   Physical Therapy Cancellation Note      Patient:  Carl [name] MRN#:  [id number]  [facility]  [facility] Room/Bed:  QP595/QP595-98    2023-11-15  Time: 2:40 PM       PT Cancellation: Visit  PT Visit Cancellation Reason: Provider/Medical Hold (comment required) - Provider/RN decision (Hold per RN request. Patient to be placed on comfort measures. Spoke to RN.)         Camellia Martinez PT, DPT [phone]

## 2023-11-04 NOTE — Progress Notes (Signed)
 MEDICINE PROGRESS NOTE    Date Time: 11-15-23 3:22 PM  Patient Name: Carl [name]  Attending Physician: Evelia Service, MD    Assessment / Plan:   Acute on chronic hypoxic respiratory failure 4 L nasal cannula which is rapidly escalated over the last 24 hours to high flow mostly due to increased work of breathin.  Interventional radiology placed a CT-guided drain 2/15 with much improved pleural fluid.  Continue current drain on lower chest.  Discussed with interventional pulmonary will believe this is progression without reversible treatment options.  There is also progression of his underlying metastatic cancer which is the likely underlying cause for decompensation.  De-escalate antibiotics to Zosyn  awaiting culture data from new drain though it is unlikely empyema causing above.  Goals of care discussed 11/15/2023 with son and palliative care at the bedside.  With his rapid decline in respiratory status over the last 24 hours.  Will transition to comfort measures.  GIP consult will be obtained.  Hospice info session will be obtained.  No further labs or nonessential medications.  Metastatic head and neck cancer.  Lungs, pericardium and adrenal glands.  Has had poor functional status to obtain chemotherapy.  Appreciate hematology oncology to visit family to assist with goals of care.  Severe lactic acidemia.  Resolved with fluid resuscitation on admission  Chronic CHF with preserved ejection fraction.  Resume Bumex  at daily dosing to maintain euvolemia.  Held 2/16 due to concern for volume depletion.  Changed Imdur  to evening dosing due to episode of hypotension 2/16  Paroxysmal atrial fibrillation.  Diltiazem  Toprol  warfarin on hold for procedure now resumed with therapeutic INR at 1.8 -->2.3 --> 3.1 which makes pulmonary embolism unlikely.  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  Acute kidney injury likely represents ATN from  hypotension.  Will follow.  Avoid nephrotoxins.  Follow electrolytes    Case discussed with: Patient son daughter-in-law.  They are aware of his poor prognosis and agree to comfort measures.  Pulmonary palliative care oncology    Additional Diagnoses:                 Safety Checklist:         Lines:     Patient Lines/Drains/Airways Status       Active PICC Line / CVC Line / PIV Line / Drain / Airway / Intraosseous Line / Epidural Line / ART Line / Line / Wound / Pressure Ulcer / NG/OG Tube       Name Placement date Placement time Site Days    Peripheral IV 10/19/23 20 G Right Antecubital 10/19/23  --  Antecubital  1                     Disposition: (Please see PAF column for Expected D/C Date)   Today's date: 11/15/2023  Admit Date: 10/20/2023  1:49 AM  LOS: 3  Clinical Milestones: Treatment of loculated pleural effusion  Anticipated discharge needs: Comfort measures possible hospice      Subjective     CC: Emphysema lung    Interval History/24 hour events: Escalating oxygen requirement to treat work of breathing    HPI/Subjective: Carl [name] reports that he is feeling worse.  More short of breath.  Did not rest overnight.  Was given Xanax  as needed.  Through the course of the day today he had increased work of breathing.  Nurses increased his FiO2 on high flow.  [facility] chest x-ray reviewed.      Review of Systems:     As per HPI    Physical Exam:     VITAL SIGNS PHYSICAL EXAM   Temp:  [97 F (36.1 C)-97.7 F (36.5 C)] 97.5 F (36.4 C)  Heart Rate:  [90-107] 107  Resp Rate:  [15-28] 28  BP: (91-116)/(51-72) 116/72  FiO2 (%):  [40 %-100 %] 100 %  Blood Glucose:    Telemetry: Atrial fibrillation      Intake/Output Summary (Last 24 hours) at 11-04-23 1522  Last data filed at 11-04-2023 0800  Gross per 24 hour   Intake --   Output 390 ml   Net -390 ml    Physical Exam  General:  alert oriented X 2  Cardiovascular: regular rate and rhythm, no murmurs, rubs or gallops  Lungs: Diminished diffusely with  scattered expiratory wheezing, tachypneic  Abdomen: soft, non-tender, non-distended; no palpable masses,  normoactive bowel sounds  Extremities: 4+ pitting edema bilateral lower extremities with compression socks in place  Frail chronically ill-appearing         Meds:     Medications were reviewed:  Current Facility-Administered Medications   Medication Dose Route Frequency     Infusion Meds[1]  PRN Medications[2]      Labs:     Labs (last 72 hours):    Recent Labs   Lab Nov 04, 2023  0540 10/22/23  0417   WBC 24.09* 20.01*   Hemoglobin 11.3* 12.0*   Hematocrit 34.4* 36.3*   Platelet Count 174 210       Recent Labs   Lab 11/04/2023  0540 10/22/23  0417   PT 34.7* 25.7*   INR 3.1 2.3    Recent Labs   Lab 2023/11/04  0950 10/22/23  0812 10/20/23  0454 10/19/23  1850   Sodium 134* 138  More results in Results Review 134*   Potassium 4.6 4.2  More results in Results Review 4.1   Chloride 98* 96*  More results in Results Review 86*   CO2 25 29  More results in Results Review 29   BUN 83* 74*  More results in Results Review 73*   Creatinine 2.6* 2.0*  More results in Results Review 1.4   Calcium  10.6* 11.0*  More results in Results Review 11.1*   Albumin   --   --   --  2.2*   Protein, Total  --   --   --  5.9*   Bilirubin, Total  --   --   --  0.5   Alkaline Phosphatase  --   --   --  175*   ALT  --   --   --  35   AST (SGOT)  --   --   --  51*   Glucose 230* 183*  More results in Results Review 179*   More results in Results Review = values in this interval not displayed.                   Microbiology, reviewed and are significant for:  Microbiology Results (last 15 days)       Procedure Component Value Units Date/Time    Culture And Gram Stain, Aerobic Bacteria, Wound/Tissue/Fluid [[id number]]     Order Status: Canceled Specimen: Pleural Fluid from Pleural Cavity, Left     Culture and Smear, Acid Fast Bacilli (AFB/Mycobacteria)(Order) [[id number]] Collected: 10/21/23 1010    Order Status: Sent Specimen: Pleural Fluid from  Pleural Cavity, Left  Updated: 10/22/23 9085    Narrative:      The following orders were created for panel order Culture and Smear, Acid Fast Bacilli (AFB/Mycobacteria)(Order).  Procedure                               Abnormality         Status                     ---------                               -----------         ------                     Acid Fast Bacilli Stain.SABRA.[[id number]]  Normal              Final result               Culture, Acid Fast Baci.SABRA.[[id number]]                      In process                   Please view results for these tests on the individual orders.    Culture and Smear, Fungal (Order) [[id number]] Collected: 10/21/23 1010    Order Status: Sent Specimen: Pleural Fluid from Pleural Cavity, Left Updated: 10/22/23 0957    Narrative:      The following orders were created for panel order Culture and Smear, Fungal (Order).  Procedure                               Abnormality         Status                     ---------                               -----------         ------                     Fungal Stain (Component)[[id number]]                        Final result               Culture, Fungal (Compon.SABRA.[[id number]]                      In process                   Please view results for these tests on the individual orders.    Culture, Acid Fast Bacilli (AFB/Mycobacteria)(Component) [[id number]] Collected: 10/21/23 1010    Order Status: Sent Specimen: Pleural Fluid from Pleural Cavity, Left Updated: 10/21/23 1049    Culture, Fungal (Component) [[id number]] Collected: 10/21/23 1010    Order Status: Sent Specimen: Pleural Fluid from Pleural Cavity, Left Updated: 10/21/23 1049    Fungal Stain (Component) [[id number]] Collected: 10/21/23 1010    Order Status: Completed Specimen: Pleural Fluid from Pleural Cavity, Left Updated: 10/22/23 0957     Fungal Stain No fungal or yeast elements  seen    Acid Fast Bacilli Stain (Component) [[id number]]  (Normal) Collected: 10/21/23 1010    Order  Status: Completed Specimen: Pleural Fluid from Pleural Cavity, Left Updated: 10/22/23 0914     Acid Fast Bacilli Stain No acid fast bacilli seen    Culture And Gram Stain, Aerobic Bacteria, Wound/Tissue/Fluid [[id number]] Collected: 10/21/23 1006    Order Status: Completed Specimen: Pleural Fluid from Pleural Cavity, Left Updated: 10/22/23 1548     Culture Aerobic Bacteria No growth to date.  Final report to follow.     Gram Stain Many WBCs      No organisms seen     Comment: Stain performed on Cytospin (concentrated) specimen        Culture + Gram Stain,Aerobic, Body Fluid [[id number]]     Order Status: Sent Specimen: Body Fluid from Pleural Fluid     Nares, MRSA (methicillin-resistant Staphylococcus aureus) Screening, PCR [[id number]]  (Normal) Collected: 10/20/23 0505    Order Status: Completed Specimen: Swab from Nares Updated: 10/20/23 0644     MRSA (methicillin resistant Staphylococcus aureus) DNA Not Detected     Comment: Testing performed with the Xpert MRSA NxG assay. This test is intended for the detection of methicillin-resistant Staphylococcus aureus (MRSA) DNA. This test is not intended to diagnose, guide, or monitor treatment for MRSA infections, or provide results of susceptibility to methicillin. A positive test result does not necessarily indicate the presence of viable organisms. A negative result does not preclude MRSA nasal colonization.       Culture, Blood, Aerobic And Anaerobic [[id number]] Collected: 10/19/23 1923    Order Status: Completed Specimen: Blood, Venous Updated: 10/30/2023 0100     Culture Blood No growth at 3 days    COVID-19 and Influenza ([place]) (symptomatic) [[id number]]  (Abnormal) Collected: 10/09/23 1228    Order Status: Completed Specimen: Swab from Anterior Nares Updated: 10/09/23 1327     SARS-CoV-2 (COVID-19) RNA Not Detected     Influenza A RNA Detected     Influenza B RNA Not Detected    Narrative:      A result of Detected indicates POSITIVE for the presence of viral  RNA  A result of Not Detected indicates NEGATIVE for the presence of viral RNA    Test performed using the Roche cobas [place] SARS-CoV-2 & Influenza A/B assay. This is a multiplex real-time RT-PCR assay for the detection of SARS-CoV-2, influenza A, and influenza B virus RNA. Viral nucleic acids may persist in vivo, independent of viability. Detection of viral nucleic acid does not imply the presence of infectious virus, or that virus nucleic acid is the cause of clinical symptoms. Negative results do not preclude SARS-CoV-2, influenza A, and/or influenza B infection and should not be used as the sole basis for diagnosis, treatment or other patient management decisions. Invalid results may be due to inhibiting substances in the specimen and recollection should occur.             Imaging, reviewed and are significant for:  XR Chest AP Portable    Result Date: 10/30/23  1.Improved left upper lobe loculated effusion post pigtail drainage. 2.Persistent left lower lobe opacity and underlying small to moderate effusion which is similar to prior radiograph. Pankaj Nepal, MD 2023/10/30 10:06 AM         Signed by: Eleanore Buss, MD               [1]   Current Facility-Administered Medications   Medication Dose Route Frequency Last Rate  HYDROmorphone   0.5 mg/hr Intravenous Continuous     [2]   Current Facility-Administered Medications   Medication Dose Route    acetaminophen   650 mg Oral    Or    acetaminophen   650 mg Rectal    albuterol -ipratropium  3 mL Nebulization    benzocaine -menthol   1 lozenge Buccal    benzonatate   100 mg Oral    bisacodyl   10 mg Rectal    carboxymethylcellulose sodium  1 drop Both Eyes    dextrose   15 g of glucose Oral    Or    dextrose   12.5 g Intravenous    Or    dextrose   12.5 g Intravenous    Or    glucagon  (rDNA)  1 mg Intramuscular    glycopyrrolate   0.4 mg Intravenous    haloperidol  lactate  1 mg Intravenous    HYDROmorphone   0.5 mg Intravenous    LORazepam   0.5 mg Intravenous    LORazepam   1  mg Oral    magnesium  sulfate  1 g Intravenous    naloxone   0.2 mg Intravenous    saline  2 spray Each Nare

## 2023-11-04 NOTE — Death Pronouncement Note (Signed)
 DEATH PRONOUNCEMENT NOTE     Date Time: 03-Nov-2023 11:28 PM  Patient Name: Carl [name]  Attending Physician: Evelia Service, MD  PCP: Lon Ip, MD    I was called to [name] bedside to pronounce that he has died.     Exam:  no spontaneous movements were present, no response to verbal or tactile stimuli, no breath sounds were appreciated over either lung field, and no heart sounds heard over precordium    Patient pronounced deceased at 11:16 PM on 11/03/2023.       Family were at bedside. The family did not request an autopsy. Chaplain and postmortem services offered. The medical examiner was not notified. Patient was NO CPR  -  ALLOW NATURAL DEATH.    Gaither Ota, MD

## 2023-11-04 NOTE — Plan of Care (Addendum)
 Shift Note:   Prn tylenol  given x2 for abd/generalized pain.     2145: Pt anxious and restless, teleICU notified, x1 dose of xanax  ordered and given with intended effect.     Pt continuing to have diminished UOP, asymptomatic. Bladder scanned throughout the shift:   2155: 286 mL   0145: 370 mL  0500: 224 mL + 250 mL urine output    TeleICU updated of bladder scan volume and urine output, straight cath to be completed if scan volume shows >500 mL.     L-side CT, -20 suction:   Total output this shift: 60 mL  Total output in atrium: 790 mL    IVF (NS @100  mL/hr) ran throughout this shift, stopped at 0440.     Neuro: A&O x1 (oriented to self only), drowsy, restless, anxious   Cardio: Afib on tele, SBP 90-110's  Respiratory: HFNC 40L/40%, pt sating in the low to mid 90's  Ambulation: x2-3 bed mobility   Pain: Abd/gen pain, see above for interventions    Lines/Drips: 20g RFA, dressing and site C/D/I  GI/GU: Continent x2, ext cath, LBM 02/13  Diet: Heart healthy / Consistent carb   Fall score: High      Comments:  For patient safety precautions floor mats down, side rails up 3/4, bed in lowest position, bedside table within reach, white board updated, room free from clutter. Pt instructed to call when needed. Call bell & pt belonging within reach.     Plan:   Nebs  IV abx   Q shift/prn straight cath   CT mgmt - daily CXR  Wean O2 as tolerated - goal > 90%  Monitor UOP, mentation, and BP  Palliative and thoracic surgery team following       Problem: Compromised Sensory Perception  Goal: Sensory Perception Interventions  Outcome: Progressing  Flowsheets (Taken 10/22/2023 1930)  Sensory Perception Interventions: Offload heels, Pad bony prominences, Reposition q 2hrs/turn Clock, Q2 hour skin assessment under devices if present     Problem: Compromised Moisture  Goal: Moisture level Interventions  Outcome: Progressing  Flowsheets (Taken 10/22/2023 1930)  Moisture level Interventions: Moisture wicking products, Moisture barrier  cream     Problem: Compromised Activity/Mobility  Goal: Activity/Mobility Interventions  Outcome: Progressing  Flowsheets (Taken 10/22/2023 1930)  Activity/Mobility Interventions: Pad bony prominences, TAP Seated positioning system when OOB, Promote PMP, Reposition q 2 hrs / turn clock, Offload heels     Problem: Compromised Nutrition  Goal: Nutrition Interventions  Outcome: Progressing  Flowsheets (Taken 10/22/2023 1930)  Nutrition Interventions: Discuss nutrition at Rounds, I&Os, Document % meal eaten, Daily weights     Problem: Compromised Friction/Shear  Goal: Friction and Shear Interventions  Outcome: Progressing  Flowsheets (Taken 10/22/2023 1930)  Friction and Shear Interventions: Pad bony prominences, Off load heels, HOB 30 degrees or less unless contraindicated, Consider: TAP seated positioning, Heel foams     Problem: Moderate/High Fall Risk Score >5  Goal: Patient will remain free of falls  Outcome: Progressing  Flowsheets (Taken 10/22/2023 1930)  High (Greater than 13):   HIGH-Visual cue at entrance to patient's room   HIGH-Utilize chair pad alarm for patient while in the chair   HIGH-Bed alarm on at all times while patient in bed   HIGH-Pharmacy to initiate evaluation and intervention per protocol   HIGH-Apply yellow Fall Risk arm band   HIGH-Initiate use of floor mats as appropriate   HIGH-Consider use of low bed     Problem: Hemodynamic Status: Cardiac  Goal:  Stable vital signs and fluid balance  Outcome: Progressing  Flowsheets (Taken 30-Oct-2023 0101)  Stable vital signs and fluid balance:   Assess signs and symptoms associated with cardiac rhythm changes   Monitor lab values     Problem: Pain interferes with ability to perform ADL  Goal: Pain at adequate level as identified by patient  Outcome: Progressing  Flowsheets (Taken Oct 30, 2023 0101)  Pain at adequate level as identified by patient:   Identify patient comfort function goal   Assess for risk of opioid induced respiratory depression, including  snoring/sleep apnea. Alert healthcare team of risk factors identified.   Assess pain on admission, during daily assessment and/or before any as needed intervention(s)   Reassess pain within 30-60 minutes of any procedure/intervention, per Pain Assessment, Intervention, Reassessment (AIR) Cycle   Evaluate patient's satisfaction with pain management progress   Evaluate if patient comfort function goal is met   Offer non-pharmacological pain management interventions   Consult/collaborate with Physical Therapy, Occupational Therapy, and/or Speech Therapy   Include patient/patient care companion in decisions related to pain management as needed     Problem: Side Effects from Pain Analgesia  Goal: Patient will experience minimal side effects of analgesic therapy  Outcome: Progressing  Flowsheets (Taken 10/30/23 0101)  Patient will experience minimal side effects of analgesic therapy:   Monitor/assess patient's respiratory status (RR depth, effort, breath sounds)   Assess for changes in cognitive function   Prevent/manage side effects per LIP orders (i.e. nausea, vomiting, pruritus, constipation, urinary retention, etc.)   Evaluate for opioid-induced sedation with appropriate assessment tool (i.e. POSS)     Problem: Safety  Goal: Patient will be free from injury during hospitalization  Outcome: Progressing  Flowsheets (Taken October 30, 2023 0101)  Patient will be free from injury during hospitalization:   Assess patient's risk for falls and implement fall prevention plan of care per policy   Provide and maintain safe environment   Use appropriate transfer methods   Ensure appropriate safety devices are available at the bedside   Include patient/ family/ care giver in decisions related to safety   Hourly rounding   Provide alternative method of communication if needed (communication boards, writing)   Assess for patients risk for elopement and implement Elopement Risk Plan per policy  Goal: Patient will be free from infection  during hospitalization  Outcome: Progressing  Flowsheets (Taken 2023/10/30 0101)  Free from Infection during hospitalization:   Assess and monitor for signs and symptoms of infection   Monitor lab/diagnostic results   Monitor all insertion sites (i.e. indwelling lines, tubes, urinary catheters, and drains)   Encourage patient and family to use good hand hygiene technique     Problem: Pain  Goal: Pain at adequate level as identified by patient  Outcome: Progressing  Flowsheets (Taken 2023-10-30 0101)  Pain at adequate level as identified by patient:   Identify patient comfort function goal   Assess for risk of opioid induced respiratory depression, including snoring/sleep apnea. Alert healthcare team of risk factors identified.   Assess pain on admission, during daily assessment and/or before any as needed intervention(s)   Reassess pain within 30-60 minutes of any procedure/intervention, per Pain Assessment, Intervention, Reassessment (AIR) Cycle   Evaluate patient's satisfaction with pain management progress   Evaluate if patient comfort function goal is met   Offer non-pharmacological pain management interventions   Consult/collaborate with Physical Therapy, Occupational Therapy, and/or Speech Therapy   Include patient/patient care companion in decisions related to pain management as needed  Problem: Inadequate Gas Exchange  Goal: Adequate oxygenation and improved ventilation  Outcome: Progressing  Flowsheets (Taken Nov 07, 2023 0101)  Adequate oxygenation and improved ventilation:   Assess lung sounds   Monitor SpO2 and treat as needed   Provide mechanical and oxygen support to facilitate gas exchange   Teach/reinforce use of incentive spirometer 10 times per hour while awake, cough and deep breath as needed   Plan activities to conserve energy: plan rest periods   Increase activity as tolerated/progressive mobility   Consult/collaborate with Respiratory Therapy  Goal: Patent Airway maintained  Outcome:  Progressing  Flowsheets (Taken 2023-11-07 0101)  Patent airway maintained:   Position patient for maximum ventilatory efficiency   Reposition patient every 2 hours and as needed unless able to self-reposition     Problem: Fluid and Electrolyte Imbalance/ Endocrine  Goal: Fluid and electrolyte balance are achieved/maintained  Outcome: Progressing  Flowsheets (Taken 2023-11-07 0101)  Fluid and electrolyte balance are achieved/maintained:   Monitor/assess lab values and report abnormal values   Assess and reassess fluid and electrolyte status   Observe for cardiac arrhythmias   Monitor for muscle weakness

## 2023-11-04 DEATH — deceased

## 2023-11-08 ENCOUNTER — Ambulatory Visit: Payer: Medicare Other

## 2023-11-08 ENCOUNTER — Other Ambulatory Visit (INDEPENDENT_AMBULATORY_CARE_PROVIDER_SITE_OTHER): Payer: Medicare Other

## 2023-11-20 LAB — LAB USE ONLY - CULTURE, FUNGAL: Culture Fungal: NO GROWTH

## 2023-11-22 ENCOUNTER — Ambulatory Visit (INDEPENDENT_AMBULATORY_CARE_PROVIDER_SITE_OTHER): Payer: Medicare Other | Admitting: Internal Medicine

## 2023-12-03 LAB — LAB USE ONLY - CULTURE, ACID FAST BACILLI (AFB/MYCOBACTERIA): Culture Acid Fast Bacillus (AFB): NO GROWTH

## 2023-12-06 ENCOUNTER — Ambulatory Visit (INDEPENDENT_AMBULATORY_CARE_PROVIDER_SITE_OTHER): Payer: Medicare Other | Admitting: Nurse Practitioner

## 2023-12-07 ENCOUNTER — Encounter: Payer: Self-pay | Admitting: Pulmonary Disease

## 2023-12-07 NOTE — Anesthesia Postprocedure Evaluation (Signed)
 Anesthesia Post Evaluation    Patient: Carl [name]    ULTRASOUND, ENDOBRONCHIAL (EBUS) (Chest)  PLACEMENT, INDWELLING TUNNELED CATHETER, PLEURAL (Left)    Anesthesia type: general    Last Vitals:   Vitals Value Taken Time   BP 141/79 09/19/23 16:00   Temp 36.5 C (97.7 F) 09/19/23 15:22   Pulse 102 09/19/23 16:00   Resp 21 09/19/23 16:00   SpO2 90 % 09/19/23 16:00                 Anesthesia Post Evaluation:     Patient Evaluated: PACU  Patient Participation: complete - patient participated  Level of Consciousness: awake and alert    Pain Management: adequate  Multimodal analgesia pain management approach  Strategies: local anesthesia and steroids  Airway Patency: patent  Two or more mitigation strategies used for obstructive sleep apnea.  Strategies: multimodal analgesia, awake extubation and extubation/recovery carried out in nonsupine position    Anesthetic complications: No      PONV Status: none    Cardiovascular status: acceptable  Respiratory status: acceptable  Hydration status: acceptable          Signed by: Kathrine CHRISTELLA Casandra Ali, MD, 12/07/2023 8:06 AM

## 2024-07-25 ENCOUNTER — Encounter: Payer: Self-pay | Admitting: Medical Oncology
